# Patient Record
Sex: Female | Born: 1935
Health system: Southern US, Community
[De-identification: ages and names within clinical notes are randomized; demographics above are authoritative.]

## PROBLEM LIST (undated history)

## (undated) DIAGNOSIS — M542 Cervicalgia: Secondary | ICD-10-CM

## (undated) DIAGNOSIS — Z0000 Encounter for general adult medical examination without abnormal findings: Secondary | ICD-10-CM

## (undated) DIAGNOSIS — I10 Essential (primary) hypertension: Secondary | ICD-10-CM

## (undated) DIAGNOSIS — E785 Hyperlipidemia, unspecified: Secondary | ICD-10-CM

## (undated) DIAGNOSIS — R42 Dizziness and giddiness: Secondary | ICD-10-CM

## (undated) DIAGNOSIS — R059 Cough, unspecified: Secondary | ICD-10-CM

## (undated) DIAGNOSIS — K219 Gastro-esophageal reflux disease without esophagitis: Secondary | ICD-10-CM

## (undated) DIAGNOSIS — D649 Anemia, unspecified: Secondary | ICD-10-CM

## (undated) DIAGNOSIS — I2510 Atherosclerotic heart disease of native coronary artery without angina pectoris: Secondary | ICD-10-CM

## (undated) DIAGNOSIS — Z8489 Family history of other specified conditions: Secondary | ICD-10-CM

## (undated) DIAGNOSIS — A0472 Enterocolitis due to Clostridium difficile, not specified as recurrent: Secondary | ICD-10-CM

## (undated) DIAGNOSIS — M1990 Unspecified osteoarthritis, unspecified site: Secondary | ICD-10-CM

## (undated) DIAGNOSIS — E119 Type 2 diabetes mellitus without complications: Secondary | ICD-10-CM

## (undated) DIAGNOSIS — R221 Localized swelling, mass and lump, neck: Secondary | ICD-10-CM

## (undated) DIAGNOSIS — L301 Dyshidrosis [pompholyx]: Secondary | ICD-10-CM

## (undated) DIAGNOSIS — N289 Disorder of kidney and ureter, unspecified: Secondary | ICD-10-CM

## (undated) DIAGNOSIS — K5900 Constipation, unspecified: Secondary | ICD-10-CM

## (undated) DIAGNOSIS — F32A Depression, unspecified: Secondary | ICD-10-CM

## (undated) DIAGNOSIS — F329 Major depressive disorder, single episode, unspecified: Secondary | ICD-10-CM

## (undated) DIAGNOSIS — C801 Malignant (primary) neoplasm, unspecified: Secondary | ICD-10-CM

## (undated) DIAGNOSIS — K810 Acute cholecystitis: Secondary | ICD-10-CM

## (undated) DIAGNOSIS — E039 Hypothyroidism, unspecified: Secondary | ICD-10-CM

## (undated) DIAGNOSIS — N390 Urinary tract infection, site not specified: Secondary | ICD-10-CM

## (undated) DIAGNOSIS — R05 Cough: Secondary | ICD-10-CM

## (undated) DIAGNOSIS — N2581 Secondary hyperparathyroidism of renal origin: Secondary | ICD-10-CM

## (undated) DIAGNOSIS — N189 Chronic kidney disease, unspecified: Secondary | ICD-10-CM

## (undated) HISTORY — DX: Essential (primary) hypertension: I10

## (undated) HISTORY — DX: Cough: R05

## (undated) HISTORY — DX: Hyperlipidemia, unspecified: E78.5

## (undated) HISTORY — DX: Localized swelling, mass and lump, neck: R22.1

## (undated) HISTORY — DX: Acute cholecystitis: K81.0

## (undated) HISTORY — DX: Urinary tract infection, site not specified: N39.0

## (undated) HISTORY — DX: Type 2 diabetes mellitus without complications: E11.9

## (undated) HISTORY — PX: CHOLECYSTECTOMY: SHX55

## (undated) HISTORY — DX: Atherosclerotic heart disease of native coronary artery without angina pectoris: I25.10

## (undated) HISTORY — DX: Disorder of kidney and ureter, unspecified: N28.9

## (undated) HISTORY — DX: Anemia, unspecified: D64.9

## (undated) HISTORY — DX: Hypothyroidism, unspecified: E03.9

## (undated) HISTORY — DX: Cervicalgia: M54.2

## (undated) HISTORY — DX: Encounter for general adult medical examination without abnormal findings: Z00.00

## (undated) HISTORY — DX: Chronic kidney disease, unspecified: N18.9

## (undated) HISTORY — DX: Secondary hyperparathyroidism of renal origin: N25.81

## (undated) HISTORY — DX: Dyshidrosis (pompholyx): L30.1

## (undated) HISTORY — DX: Cough, unspecified: R05.9

## (undated) HISTORY — DX: Dizziness and giddiness: R42

## (undated) HISTORY — DX: Enterocolitis due to Clostridium difficile, not specified as recurrent: A04.72

## (undated) HISTORY — DX: Gastro-esophageal reflux disease without esophagitis: K21.9

---

## 1975-04-21 HISTORY — PX: OTHER SURGICAL HISTORY: SHX169

## 1986-04-20 HISTORY — PX: OTHER SURGICAL HISTORY: SHX169

## 2000-07-28 ENCOUNTER — Emergency Department (HOSPITAL_COMMUNITY): Admission: EM | Admit: 2000-07-28 | Discharge: 2000-07-28 | Payer: Self-pay | Admitting: Emergency Medicine

## 2001-03-21 ENCOUNTER — Other Ambulatory Visit: Admission: RE | Admit: 2001-03-21 | Discharge: 2001-03-21 | Payer: Self-pay | Admitting: Obstetrics and Gynecology

## 2001-04-06 ENCOUNTER — Encounter: Payer: Self-pay | Admitting: Obstetrics and Gynecology

## 2001-04-06 ENCOUNTER — Ambulatory Visit (HOSPITAL_COMMUNITY): Admission: RE | Admit: 2001-04-06 | Discharge: 2001-04-06 | Payer: Self-pay | Admitting: Obstetrics and Gynecology

## 2002-04-07 ENCOUNTER — Ambulatory Visit (HOSPITAL_COMMUNITY): Admission: RE | Admit: 2002-04-07 | Discharge: 2002-04-07 | Payer: Self-pay | Admitting: Obstetrics and Gynecology

## 2002-04-07 ENCOUNTER — Encounter: Payer: Self-pay | Admitting: Obstetrics and Gynecology

## 2003-04-19 ENCOUNTER — Ambulatory Visit (HOSPITAL_COMMUNITY): Admission: RE | Admit: 2003-04-19 | Discharge: 2003-04-19 | Payer: Self-pay | Admitting: Obstetrics & Gynecology

## 2004-02-20 ENCOUNTER — Ambulatory Visit: Payer: Self-pay | Admitting: Endocrinology

## 2004-03-21 ENCOUNTER — Ambulatory Visit: Payer: Self-pay | Admitting: Internal Medicine

## 2004-04-28 ENCOUNTER — Ambulatory Visit (HOSPITAL_COMMUNITY): Admission: RE | Admit: 2004-04-28 | Discharge: 2004-04-28 | Payer: Self-pay | Admitting: Endocrinology

## 2004-05-19 ENCOUNTER — Ambulatory Visit: Payer: Self-pay | Admitting: Endocrinology

## 2004-06-11 ENCOUNTER — Ambulatory Visit: Payer: Self-pay | Admitting: Endocrinology

## 2004-07-10 ENCOUNTER — Ambulatory Visit: Payer: Self-pay | Admitting: Endocrinology

## 2004-07-11 ENCOUNTER — Ambulatory Visit (HOSPITAL_COMMUNITY): Admission: RE | Admit: 2004-07-11 | Discharge: 2004-07-11 | Payer: Self-pay | Admitting: Endocrinology

## 2004-07-23 ENCOUNTER — Ambulatory Visit: Payer: Self-pay | Admitting: Endocrinology

## 2004-09-09 ENCOUNTER — Ambulatory Visit: Payer: Self-pay | Admitting: Endocrinology

## 2004-10-22 ENCOUNTER — Ambulatory Visit: Payer: Self-pay | Admitting: Endocrinology

## 2005-01-29 ENCOUNTER — Ambulatory Visit: Payer: Self-pay | Admitting: Endocrinology

## 2005-03-03 ENCOUNTER — Ambulatory Visit: Payer: Self-pay | Admitting: Endocrinology

## 2005-03-10 ENCOUNTER — Ambulatory Visit: Payer: Self-pay | Admitting: Endocrinology

## 2005-03-23 ENCOUNTER — Ambulatory Visit: Payer: Self-pay | Admitting: Endocrinology

## 2005-04-17 ENCOUNTER — Encounter (HOSPITAL_COMMUNITY): Admission: RE | Admit: 2005-04-17 | Discharge: 2005-07-16 | Payer: Self-pay | Admitting: Nephrology

## 2005-04-20 HISTORY — PX: OTHER SURGICAL HISTORY: SHX169

## 2005-05-04 ENCOUNTER — Ambulatory Visit: Payer: Self-pay | Admitting: Endocrinology

## 2005-05-20 ENCOUNTER — Ambulatory Visit (HOSPITAL_COMMUNITY): Admission: RE | Admit: 2005-05-20 | Discharge: 2005-05-20 | Payer: Self-pay | Admitting: Endocrinology

## 2005-05-25 ENCOUNTER — Ambulatory Visit: Payer: Self-pay | Admitting: Endocrinology

## 2005-06-24 ENCOUNTER — Ambulatory Visit: Payer: Self-pay | Admitting: Endocrinology

## 2005-07-02 ENCOUNTER — Ambulatory Visit: Payer: Self-pay | Admitting: Endocrinology

## 2005-07-09 ENCOUNTER — Ambulatory Visit: Payer: Self-pay | Admitting: Endocrinology

## 2005-07-12 ENCOUNTER — Emergency Department (HOSPITAL_COMMUNITY): Admission: EM | Admit: 2005-07-12 | Discharge: 2005-07-12 | Payer: Self-pay | Admitting: Emergency Medicine

## 2005-07-15 ENCOUNTER — Ambulatory Visit: Payer: Self-pay | Admitting: Endocrinology

## 2005-07-27 ENCOUNTER — Ambulatory Visit: Payer: Self-pay | Admitting: Internal Medicine

## 2005-07-30 ENCOUNTER — Encounter (HOSPITAL_COMMUNITY): Admission: RE | Admit: 2005-07-30 | Discharge: 2005-10-28 | Payer: Self-pay | Admitting: Nephrology

## 2005-08-21 HISTORY — PX: OTHER SURGICAL HISTORY: SHX169

## 2005-10-07 ENCOUNTER — Ambulatory Visit: Payer: Self-pay | Admitting: Endocrinology

## 2005-10-15 ENCOUNTER — Ambulatory Visit: Payer: Self-pay | Admitting: Endocrinology

## 2005-11-02 ENCOUNTER — Encounter (HOSPITAL_COMMUNITY): Admission: RE | Admit: 2005-11-02 | Discharge: 2005-12-30 | Payer: Self-pay | Admitting: Nephrology

## 2005-11-25 ENCOUNTER — Ambulatory Visit: Payer: Self-pay | Admitting: Endocrinology

## 2005-12-10 ENCOUNTER — Ambulatory Visit: Payer: Self-pay | Admitting: Endocrinology

## 2005-12-30 ENCOUNTER — Encounter (HOSPITAL_COMMUNITY): Admission: RE | Admit: 2005-12-30 | Discharge: 2006-03-30 | Payer: Self-pay | Admitting: Nephrology

## 2006-01-21 ENCOUNTER — Ambulatory Visit: Payer: Self-pay | Admitting: Endocrinology

## 2006-02-01 ENCOUNTER — Ambulatory Visit: Payer: Self-pay | Admitting: Endocrinology

## 2006-02-01 HISTORY — PX: ELECTROCARDIOGRAM: SHX264

## 2006-02-02 ENCOUNTER — Ambulatory Visit: Payer: Self-pay

## 2006-02-04 ENCOUNTER — Ambulatory Visit: Admission: RE | Admit: 2006-02-04 | Discharge: 2006-02-04 | Payer: Self-pay | Admitting: Orthopedic Surgery

## 2006-02-10 ENCOUNTER — Ambulatory Visit (HOSPITAL_COMMUNITY): Admission: RE | Admit: 2006-02-10 | Discharge: 2006-02-10 | Payer: Self-pay | Admitting: Endocrinology

## 2006-02-23 ENCOUNTER — Ambulatory Visit: Payer: Self-pay | Admitting: Endocrinology

## 2006-02-24 ENCOUNTER — Ambulatory Visit: Payer: Self-pay | Admitting: Cardiology

## 2006-03-02 ENCOUNTER — Inpatient Hospital Stay (HOSPITAL_COMMUNITY): Admission: RE | Admit: 2006-03-02 | Discharge: 2006-03-05 | Payer: Self-pay | Admitting: Orthopedic Surgery

## 2006-03-03 ENCOUNTER — Ambulatory Visit: Payer: Self-pay | Admitting: Physical Medicine & Rehabilitation

## 2006-04-21 LAB — CONVERTED CEMR LAB: Pap Smear: NORMAL

## 2006-05-05 ENCOUNTER — Ambulatory Visit: Payer: Self-pay | Admitting: Endocrinology

## 2006-05-12 ENCOUNTER — Encounter (HOSPITAL_COMMUNITY): Admission: RE | Admit: 2006-05-12 | Discharge: 2006-08-10 | Payer: Self-pay | Admitting: Nephrology

## 2006-08-06 ENCOUNTER — Ambulatory Visit: Payer: Self-pay | Admitting: Endocrinology

## 2006-08-19 ENCOUNTER — Encounter (HOSPITAL_COMMUNITY): Admission: RE | Admit: 2006-08-19 | Discharge: 2006-11-17 | Payer: Self-pay | Admitting: Nephrology

## 2006-09-20 ENCOUNTER — Encounter: Payer: Self-pay | Admitting: Endocrinology

## 2006-09-20 DIAGNOSIS — M109 Gout, unspecified: Secondary | ICD-10-CM

## 2006-09-20 DIAGNOSIS — I10 Essential (primary) hypertension: Secondary | ICD-10-CM | POA: Insufficient documentation

## 2006-09-20 DIAGNOSIS — M810 Age-related osteoporosis without current pathological fracture: Secondary | ICD-10-CM

## 2006-09-20 DIAGNOSIS — R32 Unspecified urinary incontinence: Secondary | ICD-10-CM

## 2006-09-20 DIAGNOSIS — E785 Hyperlipidemia, unspecified: Secondary | ICD-10-CM | POA: Insufficient documentation

## 2006-09-20 DIAGNOSIS — J309 Allergic rhinitis, unspecified: Secondary | ICD-10-CM | POA: Insufficient documentation

## 2006-11-26 ENCOUNTER — Encounter (HOSPITAL_COMMUNITY): Admission: RE | Admit: 2006-11-26 | Discharge: 2007-02-24 | Payer: Self-pay | Admitting: Nephrology

## 2007-02-07 ENCOUNTER — Ambulatory Visit: Payer: Self-pay | Admitting: Endocrinology

## 2007-02-07 LAB — CONVERTED CEMR LAB
AST: 21 units/L (ref 0–37)
Albumin: 3.6 g/dL (ref 3.5–5.2)
BUN: 42 mg/dL — ABNORMAL HIGH (ref 6–23)
CO2: 32 meq/L (ref 19–32)
Cholesterol: 145 mg/dL (ref 0–200)
GFR calc Af Amer: 28 mL/min
GFR calc non Af Amer: 23 mL/min
LDL Cholesterol: 85 mg/dL (ref 0–99)
Potassium: 4.7 meq/L (ref 3.5–5.1)
Sodium: 143 meq/L (ref 135–145)
Total Protein: 6.8 g/dL (ref 6.0–8.3)
Triglycerides: 115 mg/dL (ref 0–149)
Uric Acid, Serum: 4.4 mg/dL (ref 2.4–7.0)
VLDL: 23 mg/dL (ref 0–40)

## 2007-03-09 ENCOUNTER — Ambulatory Visit: Payer: Self-pay | Admitting: Endocrinology

## 2007-03-09 DIAGNOSIS — K219 Gastro-esophageal reflux disease without esophagitis: Secondary | ICD-10-CM

## 2007-03-10 ENCOUNTER — Encounter (HOSPITAL_COMMUNITY): Admission: RE | Admit: 2007-03-10 | Discharge: 2007-06-08 | Payer: Self-pay | Admitting: Nephrology

## 2007-03-15 ENCOUNTER — Ambulatory Visit: Payer: Self-pay | Admitting: Cardiology

## 2007-04-05 ENCOUNTER — Ambulatory Visit: Payer: Self-pay | Admitting: Internal Medicine

## 2007-04-05 DIAGNOSIS — R220 Localized swelling, mass and lump, head: Secondary | ICD-10-CM

## 2007-04-05 DIAGNOSIS — R221 Localized swelling, mass and lump, neck: Secondary | ICD-10-CM

## 2007-04-20 ENCOUNTER — Ambulatory Visit: Payer: Self-pay | Admitting: Endocrinology

## 2007-04-20 DIAGNOSIS — M542 Cervicalgia: Secondary | ICD-10-CM | POA: Insufficient documentation

## 2007-04-20 DIAGNOSIS — E039 Hypothyroidism, unspecified: Secondary | ICD-10-CM

## 2007-04-20 LAB — CONVERTED CEMR LAB: TSH: 5.12 microintl units/mL (ref 0.35–5.50)

## 2007-04-28 ENCOUNTER — Encounter: Payer: Self-pay | Admitting: Endocrinology

## 2007-05-04 ENCOUNTER — Encounter: Payer: Self-pay | Admitting: Endocrinology

## 2007-05-19 ENCOUNTER — Ambulatory Visit: Payer: Self-pay | Admitting: Internal Medicine

## 2007-05-23 ENCOUNTER — Encounter: Payer: Self-pay | Admitting: Endocrinology

## 2007-05-27 ENCOUNTER — Encounter: Payer: Self-pay | Admitting: Internal Medicine

## 2007-06-03 ENCOUNTER — Encounter: Payer: Self-pay | Admitting: Internal Medicine

## 2007-06-13 ENCOUNTER — Encounter: Payer: Self-pay | Admitting: Endocrinology

## 2007-06-15 ENCOUNTER — Ambulatory Visit: Payer: Self-pay | Admitting: Endocrinology

## 2007-06-15 DIAGNOSIS — N2581 Secondary hyperparathyroidism of renal origin: Secondary | ICD-10-CM

## 2007-06-16 LAB — CONVERTED CEMR LAB: Hgb A1c MFr Bld: 5.4 % (ref 4.6–6.0)

## 2007-06-23 ENCOUNTER — Encounter (HOSPITAL_COMMUNITY): Admission: RE | Admit: 2007-06-23 | Discharge: 2007-09-21 | Payer: Self-pay | Admitting: Nephrology

## 2007-08-17 ENCOUNTER — Encounter: Payer: Self-pay | Admitting: Endocrinology

## 2007-08-17 ENCOUNTER — Ambulatory Visit: Payer: Self-pay | Admitting: Internal Medicine

## 2007-09-29 ENCOUNTER — Encounter (HOSPITAL_COMMUNITY): Admission: RE | Admit: 2007-09-29 | Discharge: 2007-12-28 | Payer: Self-pay | Admitting: Nephrology

## 2007-11-23 ENCOUNTER — Telehealth: Payer: Self-pay | Admitting: Endocrinology

## 2008-01-03 ENCOUNTER — Telehealth: Payer: Self-pay | Admitting: Endocrinology

## 2008-01-12 ENCOUNTER — Encounter (HOSPITAL_COMMUNITY): Admission: RE | Admit: 2008-01-12 | Discharge: 2008-04-11 | Payer: Self-pay | Admitting: Nephrology

## 2008-01-17 ENCOUNTER — Telehealth: Payer: Self-pay | Admitting: Internal Medicine

## 2008-02-01 ENCOUNTER — Ambulatory Visit: Payer: Self-pay | Admitting: Endocrinology

## 2008-02-01 LAB — CONVERTED CEMR LAB
ALT: 15 units/L (ref 0–35)
Albumin: 3.5 g/dL (ref 3.5–5.2)
Bilirubin, Direct: 0.2 mg/dL (ref 0.0–0.3)
HDL: 44.7 mg/dL (ref >39.0)
Hgb A1c MFr Bld: 5.2 % (ref 4.6–6.0)
Total Bilirubin: 0.9 mg/dL (ref 0.3–1.2)
Total Protein: 6.9 g/dL (ref 6.0–8.3)
Triglycerides: 125 mg/dL (ref 0–149)

## 2008-02-08 ENCOUNTER — Telehealth: Payer: Self-pay | Admitting: Endocrinology

## 2008-02-13 ENCOUNTER — Telehealth (INDEPENDENT_AMBULATORY_CARE_PROVIDER_SITE_OTHER): Payer: Self-pay | Admitting: *Deleted

## 2008-02-13 ENCOUNTER — Telehealth: Payer: Self-pay | Admitting: Endocrinology

## 2008-03-06 ENCOUNTER — Encounter: Payer: Self-pay | Admitting: Endocrinology

## 2008-04-26 ENCOUNTER — Encounter (HOSPITAL_COMMUNITY): Admission: RE | Admit: 2008-04-26 | Discharge: 2008-07-25 | Payer: Self-pay | Admitting: Nephrology

## 2008-05-23 ENCOUNTER — Encounter: Payer: Self-pay | Admitting: Endocrinology

## 2008-06-19 ENCOUNTER — Telehealth: Payer: Self-pay | Admitting: Endocrinology

## 2008-07-03 ENCOUNTER — Ambulatory Visit: Payer: Self-pay | Admitting: Endocrinology

## 2008-07-03 DIAGNOSIS — R42 Dizziness and giddiness: Secondary | ICD-10-CM

## 2008-07-26 ENCOUNTER — Encounter (HOSPITAL_COMMUNITY): Admission: RE | Admit: 2008-07-26 | Discharge: 2008-10-24 | Payer: Self-pay | Admitting: Nephrology

## 2008-08-14 ENCOUNTER — Telehealth: Payer: Self-pay | Admitting: Endocrinology

## 2008-08-23 ENCOUNTER — Ambulatory Visit: Payer: Self-pay | Admitting: Endocrinology

## 2008-08-23 LAB — CONVERTED CEMR LAB
Hgb A1c MFr Bld: 5.5 % (ref 4.6–6.5)
PTH: 46.3 pg/mL (ref 14.0–72.0)

## 2008-09-10 ENCOUNTER — Ambulatory Visit: Payer: Self-pay | Admitting: Endocrinology

## 2008-09-11 ENCOUNTER — Telehealth (INDEPENDENT_AMBULATORY_CARE_PROVIDER_SITE_OTHER): Payer: Self-pay | Admitting: *Deleted

## 2008-10-11 ENCOUNTER — Ambulatory Visit: Payer: Self-pay | Admitting: Endocrinology

## 2008-10-16 ENCOUNTER — Telehealth (INDEPENDENT_AMBULATORY_CARE_PROVIDER_SITE_OTHER): Payer: Self-pay | Admitting: *Deleted

## 2008-10-25 ENCOUNTER — Encounter (HOSPITAL_COMMUNITY): Admission: RE | Admit: 2008-10-25 | Discharge: 2009-01-23 | Payer: Self-pay | Admitting: Nephrology

## 2008-11-22 ENCOUNTER — Encounter: Payer: Self-pay | Admitting: Endocrinology

## 2008-12-26 ENCOUNTER — Ambulatory Visit: Payer: Self-pay | Admitting: Endocrinology

## 2008-12-26 LAB — CONVERTED CEMR LAB
Basophils Absolute: 0 10*3/uL (ref 0.0–0.1)
Bilirubin Urine: NEGATIVE
Calcium: 10.2 mg/dL (ref 8.4–10.5)
Cholesterol: 160 mg/dL (ref 0–200)
Creatinine, Ser: 2.2 mg/dL — ABNORMAL HIGH (ref 0.4–1.2)
Creatinine,U: 83.1 mg/dL
Eosinophils Relative: 3.3 % (ref 0.0–5.0)
GFR calc non Af Amer: 28.08 mL/min (ref >60)
HCT: 35.6 % — ABNORMAL LOW (ref 36.0–46.0)
HDL: 47.3 mg/dL (ref >39.00)
Hemoglobin: 11.8 g/dL — ABNORMAL LOW (ref 12.0–15.0)
Lymphocytes Relative: 29.4 % (ref 12.0–46.0)
Lymphs Abs: 1.3 10*3/uL (ref 0.7–4.0)
MCV: 97 fL (ref 78.0–100.0)
Microalb Creat Ratio: 8.4 mg/g (ref 0.0–30.0)
Microalb, Ur: 0.7 mg/dL (ref 0.0–1.9)
Monocytes Absolute: 0.5 10*3/uL (ref 0.1–1.0)
Monocytes Relative: 10.2 % (ref 3.0–12.0)
Neutro Abs: 2.6 10*3/uL (ref 1.4–7.7)
Neutrophils Relative %: 56.5 % (ref 43.0–77.0)
Nitrite: NEGATIVE
RBC: 3.67 M/uL — ABNORMAL LOW (ref 3.87–5.11)
Saturation Ratios: 22.1 % (ref 20.0–50.0)
Sodium: 138 meq/L (ref 135–145)
Specific Gravity, Urine: <=1.005 (ref 1.000–1.030)
TSH: 2.81 microintl units/mL (ref 0.35–5.50)
Total Bilirubin: 1.2 mg/dL (ref 0.3–1.2)
Total CHOL/HDL Ratio: 3
Total Protein: 7.2 g/dL (ref 6.0–8.3)
Urobilinogen, UA: 0.2 (ref 0.0–1.0)
VLDL: 30.4 mg/dL (ref 0.0–40.0)
WBC: 4.5 10*3/uL (ref 4.5–10.5)
pH: 7 (ref 5.0–8.0)

## 2009-01-01 ENCOUNTER — Telehealth: Payer: Self-pay | Admitting: Endocrinology

## 2009-01-16 ENCOUNTER — Encounter (INDEPENDENT_AMBULATORY_CARE_PROVIDER_SITE_OTHER): Payer: Self-pay | Admitting: *Deleted

## 2009-01-31 ENCOUNTER — Encounter (HOSPITAL_COMMUNITY): Admission: RE | Admit: 2009-01-31 | Discharge: 2009-05-01 | Payer: Self-pay | Admitting: Nephrology

## 2009-02-11 ENCOUNTER — Encounter: Admission: RE | Admit: 2009-02-11 | Discharge: 2009-02-11 | Payer: Self-pay | Admitting: Nephrology

## 2009-04-08 ENCOUNTER — Ambulatory Visit: Payer: Self-pay | Admitting: Endocrinology

## 2009-04-08 DIAGNOSIS — L301 Dyshidrosis [pompholyx]: Secondary | ICD-10-CM

## 2009-04-08 LAB — CONVERTED CEMR LAB: Hgb A1c MFr Bld: 6.2 % (ref 4.6–6.5)

## 2009-05-09 ENCOUNTER — Encounter (HOSPITAL_COMMUNITY): Admission: RE | Admit: 2009-05-09 | Discharge: 2009-08-07 | Payer: Self-pay | Admitting: Nephrology

## 2009-05-31 ENCOUNTER — Ambulatory Visit: Payer: Self-pay | Admitting: Endocrinology

## 2009-07-02 ENCOUNTER — Encounter: Payer: Self-pay | Admitting: Endocrinology

## 2009-07-10 ENCOUNTER — Telehealth (INDEPENDENT_AMBULATORY_CARE_PROVIDER_SITE_OTHER): Payer: Self-pay | Admitting: *Deleted

## 2009-07-26 ENCOUNTER — Telehealth: Payer: Self-pay | Admitting: Endocrinology

## 2009-07-31 ENCOUNTER — Ambulatory Visit: Payer: Self-pay | Admitting: Cardiology

## 2009-08-02 ENCOUNTER — Inpatient Hospital Stay (HOSPITAL_COMMUNITY): Admission: EM | Admit: 2009-08-02 | Discharge: 2009-08-11 | Payer: Self-pay | Admitting: Emergency Medicine

## 2009-08-08 ENCOUNTER — Encounter (HOSPITAL_COMMUNITY): Admission: RE | Admit: 2009-08-08 | Discharge: 2009-11-06 | Payer: Self-pay | Admitting: Nephrology

## 2009-08-15 ENCOUNTER — Telehealth: Payer: Self-pay | Admitting: Endocrinology

## 2009-08-15 ENCOUNTER — Ambulatory Visit: Payer: Self-pay | Admitting: Endocrinology

## 2009-08-15 DIAGNOSIS — K810 Acute cholecystitis: Secondary | ICD-10-CM

## 2009-08-16 ENCOUNTER — Telehealth: Payer: Self-pay | Admitting: Endocrinology

## 2009-08-16 ENCOUNTER — Encounter: Payer: Self-pay | Admitting: Endocrinology

## 2009-08-21 ENCOUNTER — Encounter: Payer: Self-pay | Admitting: Endocrinology

## 2009-08-21 ENCOUNTER — Encounter (INDEPENDENT_AMBULATORY_CARE_PROVIDER_SITE_OTHER): Payer: Self-pay | Admitting: *Deleted

## 2009-08-21 ENCOUNTER — Telehealth: Payer: Self-pay | Admitting: Endocrinology

## 2009-08-21 LAB — CONVERTED CEMR LAB
AST: 10 units/L
BUN: 9 mg/dL
CO2: 30 meq/L
Eosinophils Relative: 4 %
GFR calc Af Amer: 30 mL/min
GFR calc non Af Amer: 26 mL/min
Hemoglobin: 8.9 g/dL
Potassium: 5.5 meq/L
WBC: 3.9 10*3/uL

## 2009-08-23 ENCOUNTER — Encounter: Payer: Self-pay | Admitting: Endocrinology

## 2009-08-23 DIAGNOSIS — N183 Chronic kidney disease, stage 3 unspecified: Secondary | ICD-10-CM | POA: Insufficient documentation

## 2009-08-23 DIAGNOSIS — I2510 Atherosclerotic heart disease of native coronary artery without angina pectoris: Secondary | ICD-10-CM | POA: Insufficient documentation

## 2009-08-23 DIAGNOSIS — R079 Chest pain, unspecified: Secondary | ICD-10-CM | POA: Insufficient documentation

## 2009-08-23 DIAGNOSIS — A0472 Enterocolitis due to Clostridium difficile, not specified as recurrent: Secondary | ICD-10-CM | POA: Insufficient documentation

## 2009-08-26 ENCOUNTER — Telehealth (INDEPENDENT_AMBULATORY_CARE_PROVIDER_SITE_OTHER): Payer: Self-pay | Admitting: *Deleted

## 2009-08-27 ENCOUNTER — Encounter: Payer: Self-pay | Admitting: Endocrinology

## 2009-08-28 ENCOUNTER — Encounter: Payer: Self-pay | Admitting: Endocrinology

## 2009-08-29 ENCOUNTER — Encounter (INDEPENDENT_AMBULATORY_CARE_PROVIDER_SITE_OTHER): Payer: Self-pay | Admitting: *Deleted

## 2009-08-29 ENCOUNTER — Telehealth (INDEPENDENT_AMBULATORY_CARE_PROVIDER_SITE_OTHER): Payer: Self-pay | Admitting: *Deleted

## 2009-09-02 ENCOUNTER — Ambulatory Visit: Payer: Self-pay | Admitting: Endocrinology

## 2009-09-02 ENCOUNTER — Ambulatory Visit: Payer: Self-pay | Admitting: Cardiology

## 2009-09-04 LAB — CONVERTED CEMR LAB
Albumin: 3.3 g/dL — ABNORMAL LOW (ref 3.5–5.2)
Basophils Relative: 0.8 % (ref 0.0–3.0)
Bilirubin, Direct: 0.2 mg/dL (ref 0.0–0.3)
CO2: 30 meq/L (ref 19–32)
Chloride: 96 meq/L (ref 96–112)
Eosinophils Absolute: 0.2 10*3/uL (ref 0.0–0.7)
Folate: 4.3 ng/mL
GFR calc non Af Amer: 42.3 mL/min (ref >60)
Glucose, Bld: 123 mg/dL — ABNORMAL HIGH (ref 70–99)
HCT: 32.3 % — ABNORMAL LOW (ref 36.0–46.0)
Hemoglobin: 10.8 g/dL — ABNORMAL LOW (ref 12.0–15.0)
MCHC: 33.4 g/dL (ref 30.0–36.0)
Monocytes Relative: 14 % — ABNORMAL HIGH (ref 3.0–12.0)
Neutro Abs: 1.4 10*3/uL (ref 1.4–7.7)
Neutrophils Relative %: 35.5 % — ABNORMAL LOW (ref 43.0–77.0)
Platelets: 120 10*3/uL — ABNORMAL LOW (ref 150.0–400.0)
Potassium: 4.2 meq/L (ref 3.5–5.1)
RBC: 3.29 M/uL — ABNORMAL LOW (ref 3.87–5.11)
Saturation Ratios: 16 % — ABNORMAL LOW (ref 20.0–50.0)
Total Bilirubin: 0.6 mg/dL (ref 0.3–1.2)
Transferrin: 165.5 mg/dL — ABNORMAL LOW (ref 212.0–360.0)
Vitamin B-12: 829 pg/mL (ref 211–911)

## 2009-09-06 ENCOUNTER — Ambulatory Visit: Payer: Self-pay | Admitting: Endocrinology

## 2009-10-05 ENCOUNTER — Encounter: Payer: Self-pay | Admitting: Endocrinology

## 2009-10-09 ENCOUNTER — Ambulatory Visit: Payer: Self-pay | Admitting: Endocrinology

## 2009-10-09 DIAGNOSIS — D61818 Other pancytopenia: Secondary | ICD-10-CM

## 2009-10-14 ENCOUNTER — Telehealth: Payer: Self-pay | Admitting: Endocrinology

## 2009-10-14 ENCOUNTER — Encounter: Payer: Self-pay | Admitting: Internal Medicine

## 2009-10-15 ENCOUNTER — Encounter: Payer: Self-pay | Admitting: Endocrinology

## 2009-11-04 ENCOUNTER — Telehealth: Payer: Self-pay | Admitting: Endocrinology

## 2009-11-17 ENCOUNTER — Encounter: Payer: Self-pay | Admitting: Endocrinology

## 2009-11-21 ENCOUNTER — Encounter (HOSPITAL_COMMUNITY): Admission: RE | Admit: 2009-11-21 | Discharge: 2010-02-19 | Payer: Self-pay | Admitting: Nephrology

## 2009-11-25 ENCOUNTER — Emergency Department (HOSPITAL_COMMUNITY): Admission: EM | Admit: 2009-11-25 | Discharge: 2009-11-25 | Payer: Self-pay | Admitting: Emergency Medicine

## 2009-11-27 ENCOUNTER — Encounter: Payer: Self-pay | Admitting: Endocrinology

## 2009-11-28 ENCOUNTER — Ambulatory Visit: Payer: Self-pay | Admitting: Endocrinology

## 2009-11-28 ENCOUNTER — Encounter: Payer: Self-pay | Admitting: Endocrinology

## 2009-12-02 ENCOUNTER — Ambulatory Visit: Payer: Self-pay | Admitting: Internal Medicine

## 2009-12-02 DIAGNOSIS — M25512 Pain in left shoulder: Secondary | ICD-10-CM | POA: Insufficient documentation

## 2009-12-03 ENCOUNTER — Telehealth: Payer: Self-pay | Admitting: Internal Medicine

## 2009-12-17 ENCOUNTER — Ambulatory Visit: Payer: Self-pay | Admitting: Endocrinology

## 2009-12-18 ENCOUNTER — Encounter: Payer: Self-pay | Admitting: Endocrinology

## 2009-12-19 ENCOUNTER — Ambulatory Visit: Payer: Self-pay | Admitting: Internal Medicine

## 2010-01-01 ENCOUNTER — Telehealth (INDEPENDENT_AMBULATORY_CARE_PROVIDER_SITE_OTHER): Payer: Self-pay | Admitting: *Deleted

## 2010-01-06 ENCOUNTER — Encounter: Payer: Self-pay | Admitting: Endocrinology

## 2010-01-08 ENCOUNTER — Encounter: Payer: Self-pay | Admitting: Endocrinology

## 2010-01-13 ENCOUNTER — Ambulatory Visit: Payer: Self-pay | Admitting: Endocrinology

## 2010-02-24 ENCOUNTER — Encounter (HOSPITAL_COMMUNITY)
Admission: RE | Admit: 2010-02-24 | Discharge: 2010-05-20 | Payer: Self-pay | Source: Home / Self Care | Attending: Nephrology | Admitting: Nephrology

## 2010-03-04 ENCOUNTER — Ambulatory Visit: Payer: Self-pay | Admitting: Endocrinology

## 2010-03-04 DIAGNOSIS — H60399 Other infective otitis externa, unspecified ear: Secondary | ICD-10-CM | POA: Insufficient documentation

## 2010-04-08 ENCOUNTER — Telehealth (INDEPENDENT_AMBULATORY_CARE_PROVIDER_SITE_OTHER): Payer: Self-pay | Admitting: *Deleted

## 2010-04-15 ENCOUNTER — Ambulatory Visit
Admission: RE | Admit: 2010-04-15 | Discharge: 2010-04-15 | Payer: Self-pay | Source: Home / Self Care | Attending: Internal Medicine | Admitting: Internal Medicine

## 2010-04-15 DIAGNOSIS — J019 Acute sinusitis, unspecified: Secondary | ICD-10-CM

## 2010-05-05 LAB — POCT HEMOGLOBIN-HEMACUE: Hemoglobin: 10.4 g/dL — ABNORMAL LOW (ref 12.0–15.0)

## 2010-05-05 LAB — IRON AND TIBC
Iron: 71 ug/dL (ref 42–135)
Saturation Ratios: 32 % (ref 20–55)
TIBC: 224 ug/dL — ABNORMAL LOW (ref 250–470)
UIBC: 153 ug/dL

## 2010-05-05 LAB — FERRITIN: Ferritin: 388 ng/mL — ABNORMAL HIGH (ref 10–291)

## 2010-05-20 NOTE — Progress Notes (Signed)
Summary: Omeprazole PA  Phone Note From Other Clinic   Caller: [facility] Summary of Call: PA request--Omeprazole. Form completed and faxed, will await insurance company reply. Initial call taken by: Ernestene Mention,  October 14, 2009 3:56 PM     Appended Document: Omeprazole PA Approved until 09/2010.

## 2010-05-20 NOTE — Progress Notes (Signed)
Summary: Ondansetron pa  Phone Note From Pharmacy   Details of Request: [facility] ID: [id number] Summary of Call: PA request--Ondansetron. Printed request form and faxed, will await prior authorization forms. Initial call taken by: Ernestene Mention,  August 15, 2009 2:59 PM  Follow-up for Phone Call        Patient chose to get med from cheaper pharmacy. Follow-up by: Ernestene Mention,  Aug 19, 2009 4:39 PM

## 2010-05-20 NOTE — Letter (Signed)
Summary: Disability Benefit Request form/[employer]  Disability Benefit Request form/[employer]   Imported By: Phillis Knack 12/03/2009 12:05:05  _____________________________________________________________________  External Attachment:    Type:   Image     Comment:   External Document

## 2010-05-20 NOTE — Assessment & Plan Note (Signed)
Summary: PER PT 1 MTH FU--STC   Vital Signs:  Patient profile:   77 year old female Height:      62 inches (157.48 cm) Weight:      286 pounds (130 kg) BMI:     52.50 O2 Sat:      91 % on Room air Temp:     97.0 degrees F (36.11 degrees C) oral Pulse rate:   94 / minute BP sitting:   152 / 88  (left arm) Cuff size:   large  Vitals Entered By: Rebeca Alert MA (October 09, 2009 1:58 PM)  O2 Flow:  Room air CC: 1 mo f/u/pt states she is not taking prilosec currently (is out of medication)/aj   Primary Provider:  Donavan Foil MD  CC:  1 mo f/u/pt states she is not taking prilosec currently (is out of medication)/aj.  History of Present Illness: the status of at least 3 ongoing medical problems is addressed today: htn:  pt states slight doe.   anemia:  she takes fe 1/day.  no brbpr. constipation:  miralax helps constipation well.     Current Medications (verified): 1)  Oxybutynin Chloride 5 Mg Tb24 (Oxybutynin Chloride) .... Take 1 Tablet By Mouth Once A Day 2)  Allopurinol 300 Mg  Tabs (Allopurinol) .... Take 1 By Mouth Qd 3)  Procrit 10000 Unit/ml  Soln (Epoetin Alfa) .... Dr Justin Mend 4)  Furosemide 20 Mg  Tabs (Furosemide) .... Take 1 By Mouth Qd 5)  Rocaltrol 0.5 Mcg  Caps (Calcitriol) .... Take 1 By Mouth Qd 6)  Zocor 80 Mg  Tabs (Simvastatin) .... Nightly 7)  Prilosec 20 Mg  Cpdr (Omeprazole) .... Take 2 Tablet By Mouth Once A Day 8)  Ecotrin Low Strength 81 Mg  Tbec (Aspirin) .Marland Kitchen.. 1 By Mouth Qd 9)  Tylenol Arthritis Pain 650 Mg  Tbcr (Acetaminophen) .... As Needed 10)  Triamcinolone Acetonide 0.1 %  Oint (Triamcinolone Acetonide) .... Apply Twice A Day As Needed Itching 11)  Metoprolol Succinate 25 Mg Xr24h-Tab (Metoprolol Succinate) .Marland Kitchen.. 1 Tab By Mouth 12)  Losartan Potassium-Hctz 100-25 Mg Tabs (Losartan Potassium-Hctz) .... 1/2 Tab Once Daily 13)  Iron 325 (65 Fe) Mg Tabs (Ferrous Sulfate) .Marland Kitchen.. 1 Once Daily 14)  Miralax  Powd (Polyethylene Glycol 3350) .Marland KitchenMarland KitchenMarland Kitchen 17 Grams  Once Daily 15)  Zofran 4 Mg Tabs (Ondansetron Hcl) .... Take 1 Tablet By Mouth Four Times A Day As Needed  Allergies (verified): 1)  ! Sulfa 2)  ! * Azithromycin 3)  ! * Tramadol 4)  ! Hydrocodone 5)  ! Actos (Pioglitazone Hcl)  Past History:  Past Medical History: Last updated: 08/23/2009 CHEST PAIN (ICD-786.50) CLOSTRIDIUM DIFFICILE COLITIS (ICD-008.45) CAD, NATIVE VESSEL (ICD-414.01) RENAL INSUFFICIENCY, CHRONIC (ICD-585.9) HYPERTENSION (ICD-401.9) HYPERLIPIDEMIA (ICD-272.4) DIABETES MELLITUS, TYPE II (ICD-250.00) ACUTE CHOLECYSTITIS (ICD-575.0) DYSHIDROSIS (ICD-705.81) ROUTINE GENERAL MEDICAL EXAM@HEALTH  CARE FACL (ICD-V70.0) DIZZINESS (ICD-780.4) COUGH DUE TO ACE INHIBITORS (ICD-786.2) ENCOUNTER FOR LONG-TERM USE OF OTHER MEDICATIONS (ICD-V58.69) SECONDARY HYPERPARATHYROIDISM (ICD-588.81) HYPOTHYROIDISM (ICD-244.9) NECK PAIN (ICD-723.1) NECK MASS (ICD-784.2) GERD (ICD-530.81) URINARY INCONTINENCE (ICD-788.30) OSTEOPOROSIS (ICD-733.00) GOUT (ICD-274.9) ANEMIA-NOS (ICD-285.9) ALLERGIC RHINITIS (ICD-477.9)          Review of Systems  The patient denies hematuria.    Physical Exam  General:  morbidly obese.  no distress  Lungs:  Clear to auscultation bilaterally. Normal respiratory effort.  Heart:  Regular rate and rhythm without murmurs or gallops noted. Normal S1,S2.   Extremities:  trace right pedal edema and trace left pedal edema.  Impression & Recommendations:  Problem # 1:  HYPERTENSION (ICD-401.9) since hosp d/c, his medication requirement is returning to baseline  Problem # 2:  PANCYTOPENIA (ICD-284.1) she needs fe to help the anemia component of this  Problem # 3:  constipation well-controlled  Medications Added to Medication List This Visit: 1)  Losartan Potassium-hctz 100-25 Mg Tabs (Losartan potassium-hctz) .Marland Kitchen.. 1 tab once daily 2)  Zofran 4 Mg Tabs (Ondansetron hcl) .... Take 1 tablet by mouth four times a day as needed  Other  Orders: Est. Patient Level III [id number]  Patient Instructions: 1)  increase losartan-hctz, 100/25, to 1 pill once daily. 2)  Please schedule a follow-up appointment in 2 months. 3)  same miralax. 4)  i have resent prilosec to [facility].   Prescriptions: PRILOSEC 20 MG  CPDR (OMEPRAZOLE) Take 2 tablet by mouth once a day  #180 x 3   Entered and Authorized by:   Donavan Foil MD   Signed by:   Donavan Foil MD on 10/09/2009   Method used:   Electronically to        [facility] (mail-[place]  [address]       Ph: [phone]       Fax: [phone]   RxID:   [id number] LOSARTAN POTASSIUM-HCTZ 100-25 MG TABS (LOSARTAN POTASSIUM-HCTZ) 1 tab once daily  #30 x 11   Entered and Authorized by:   Donavan Foil MD   Signed by:   Donavan Foil MD on 10/09/2009   Method used:   Electronically to        [facility]. [address] [phone]* (retail)       [address]       [place], [place]  [address]       Ph: [phone] or [phone]       Fax: [phone]   RxID:   [id number]

## 2010-05-20 NOTE — Letter (Signed)
Summary: [facility]  [id number]   Imported By: Rise Patience 01/23/2010 14:48:43  _____________________________________________________________________  External Attachment:    Type:   Image     Comment:   External Document

## 2010-05-20 NOTE — Assessment & Plan Note (Signed)
Summary: EAR ACHE/ RE-CK BROKEN RIBS/NWS #   Vital Signs:  Patient profile:   77 year old female Height:      62 inches (157.48 cm) Weight:      267.38 pounds (121.54 kg) BMI:     49.08 O2 Sat:      96 % on Room air Temp:     98.5 degrees F (36.94 degrees C) oral Pulse rate:   65 / minute BP sitting:   126 / 74  (left arm) Cuff size:   large  Vitals Entered By: Rebeca Alert CMA Deborra Medina) (March 04, 2010 11:15 AM)  O2 Flow:  Room air CC: Ear Pain/Re-check ribs/aj Is Patient Diabetic? Yes   Primary Provider:  Donavan Foil MD  CC:  Ear Pain/Re-check ribs/aj.  History of Present Illness: pt states 3 days of slight pain at the left external ear, but no assoc fever.   pt says her rib pain is 80-90% improved from the initial injury.    Current Medications (verified): 1)  Oxybutynin Chloride 5 Mg Tb24 (Oxybutynin Chloride) .... Take 1 Tablet By Mouth Once A Day 2)  Allopurinol 300 Mg  Tabs (Allopurinol) .... Take 1 By Mouth Qd 3)  Procrit 10000 Unit/ml  Soln (Epoetin Alfa) .... Dr Justin Mend 4)  Furosemide 20 Mg  Tabs (Furosemide) .... Take 1 By Mouth Qd 5)  Rocaltrol 0.5 Mcg  Caps (Calcitriol) .... Take 1 By Mouth Qd 6)  Zocor 80 Mg  Tabs (Simvastatin) .... Nightly 7)  Prilosec 20 Mg  Cpdr (Omeprazole) .... Take 2 Tablet By Mouth Once A Day 8)  Ecotrin Low Strength 81 Mg  Tbec (Aspirin) .Marland Kitchen.. 1 By Mouth Qd 9)  Triamcinolone Acetonide 0.1 %  Oint (Triamcinolone Acetonide) .... Apply Twice A Day As Needed Itching 10)  Metoprolol Succinate 25 Mg Xr24h-Tab (Metoprolol Succinate) .Marland Kitchen.. 1 Tab By Mouth 11)  Losartan Potassium-Hctz 100-25 Mg Tabs (Losartan Potassium-Hctz) .Marland Kitchen.. 1 Tab Once Daily 12)  Iron 325 (65 Fe) Mg Tabs (Ferrous Sulfate) .Marland Kitchen.. 1 Once Daily 13)  Miralax  Powd (Polyethylene Glycol 3350) .Marland KitchenMarland KitchenMarland Kitchen 17 Grams Once Daily 14)  Hydrocodone-Acetaminophen 10-325 Mg Tabs (Hydrocodone-Acetaminophen) .... 1/2 Tab Every 4 Hrs As Needed For Pain 15)  Vitamin D3 1000 Unit Caps  (Cholecalciferol) .Marland Kitchen.. 1 By Mouth Two Times A Day  Allergies (verified): 1)  ! Sulfa 2)  ! * Azithromycin 3)  ! * Tramadol 4)  ! Actos (Pioglitazone Hcl)  Past History:  Past Medical History: Last updated: 08/23/2009 CHEST PAIN (ICD-786.50) CLOSTRIDIUM DIFFICILE COLITIS (ICD-008.45) CAD, NATIVE VESSEL (ICD-414.01) RENAL INSUFFICIENCY, CHRONIC (ICD-585.9) HYPERTENSION (ICD-401.9) HYPERLIPIDEMIA (ICD-272.4) DIABETES MELLITUS, TYPE II (ICD-250.00) ACUTE CHOLECYSTITIS (ICD-575.0) DYSHIDROSIS (ICD-705.81) ROUTINE GENERAL MEDICAL EXAM@HEALTH  CARE FACL (ICD-V70.0) DIZZINESS (ICD-780.4) COUGH DUE TO ACE INHIBITORS (ICD-786.2) ENCOUNTER FOR LONG-TERM USE OF OTHER MEDICATIONS (ICD-V58.69) SECONDARY HYPERPARATHYROIDISM (ICD-588.81) HYPOTHYROIDISM (ICD-244.9) NECK PAIN (ICD-723.1) NECK MASS (ICD-784.2) GERD (ICD-530.81) URINARY INCONTINENCE (ICD-788.30) OSTEOPOROSIS (ICD-733.00) GOUT (ICD-274.9) ANEMIA-NOS (ICD-285.9) ALLERGIC RHINITIS (ICD-477.9)          Review of Systems  The patient denies dyspnea on exertion.         denies nasal congestion  Physical Exam  General:  morbidly obese.  no distress  Ears:  left TM is intact and clear with normal canal, and grossly normal hearing.   the external ear is slightly tender, at the entrance to the eac.  no erythema or drainage. Chest Wall:  nontender   Impression & Recommendations:  Problem # 1:  OTITIS EXTERNA (ICD-380.10) Assessment  New  Problem # 2:  CHEST PAIN (ICD-786.50) due to contusion.  much improved  Medications Added to Medication List This Visit: 1)  Vitamin D3 1000 Unit Caps (Cholecalciferol) .Marland Kitchen.. 1 by mouth two times a day 2)  Cephalexin 250 Mg Caps (Cephalexin) .Marland Kitchen.. 1 tab three times a day  Other Orders: Est. Patient Level III [id number])  Patient Instructions: 1)  cephalexin 250 mg three times a day 2)  no more treatment is necessary for your broken ribs.  Prescriptions: CEPHALEXIN 250 MG CAPS  (CEPHALEXIN) 1 tab three times a day  #21 x 0   Entered and Authorized by:   Donavan Foil MD   Signed by:   Donavan Foil MD on 03/04/2010   Method used:   Electronically to        [facility]. [address] [phone]* (retail)       [address]       [place], [place]  [address]       Ph: [phone] or [phone]       Fax: [phone]   RxID:   [id number]    Orders Added: 1)  Est. Patient Level III [id number]

## 2010-05-20 NOTE — Letter (Signed)
Summary: [facility]   Imported By: Bubba Hales 12/30/2009 08:57:00  _____________________________________________________________________  External Attachment:    Type:   Image     Comment:   External Document

## 2010-05-20 NOTE — Letter (Signed)
Summary: [facility]   Imported By: Bubba Hales 12/10/2009 10:45:37  _____________________________________________________________________  External Attachment:    Type:   Image     Comment:   External Document

## 2010-05-20 NOTE — Letter (Signed)
Summary: [facility]   Imported By: Phillis Knack 01/17/2010 09:33:54  _____________________________________________________________________  External Attachment:    Type:   Image     Comment:   External Document

## 2010-05-20 NOTE — Letter (Signed)
Summary: [facility] for [facility] for [facility]   Imported By: Phillis Knack 08/19/2009 10:44:02  _____________________________________________________________________  External Attachment:    Type:   Image     Comment:   External Document

## 2010-05-20 NOTE — Medication Information (Signed)
Summary: Omeprazole / [facility]  Omeprazole / [facility]   Imported By: Rise Patience 10/17/2009 11:41:24  _____________________________________________________________________  External Attachment:    Type:   Image     Comment:   External Document

## 2010-05-20 NOTE — Assessment & Plan Note (Signed)
Summary: F/U APPT/#/CD   Vital Signs:  Patient profile:   77 year old female Height:      62 inches (157.48 cm) Weight:      286 pounds (130 kg) BMI:     52.50 O2 Sat:      97 % on Room air Temp:     97.9 degrees F (36.61 degrees C) oral Pulse rate:   64 / minute BP sitting:   124 / 68  (right arm) Cuff size:   large  Vitals Entered By: Rebeca Alert MA (November 28, 2009 10:24 AM)  O2 Flow:  Room air CC: F/U visit/forms/pt fracture foot from fall on Monday/aj Is Patient Diabetic? Yes Comments Pt is no longer taking Zofran   Primary Jahi Roza:  Donavan Foil MD  CC:  F/U visit/forms/pt fracture foot from fall on Monday/aj.  History of Present Illness: foot fracture: ortho recommended keeping the boot/brace on for now.  htn:  she denies sob dm:  no cbg record, but states cbg's are well-controlled.  other than the above, pt states she feels well in general.  Current Medications (verified): 1)  Oxybutynin Chloride 5 Mg Tb24 (Oxybutynin Chloride) .... Take 1 Tablet By Mouth Once A Day 2)  Allopurinol 300 Mg  Tabs (Allopurinol) .... Take 1 By Mouth Qd 3)  Procrit 10000 Unit/ml  Soln (Epoetin Alfa) .... Dr Justin Mend 4)  Furosemide 20 Mg  Tabs (Furosemide) .... Take 1 By Mouth Qd 5)  Rocaltrol 0.5 Mcg  Caps (Calcitriol) .... Take 1 By Mouth Qd 6)  Zocor 80 Mg  Tabs (Simvastatin) .... Nightly 7)  Prilosec 20 Mg  Cpdr (Omeprazole) .... Take 2 Tablet By Mouth Once A Day 8)  Ecotrin Low Strength 81 Mg  Tbec (Aspirin) .Marland Kitchen.. 1 By Mouth Qd 9)  Tylenol Arthritis Pain 650 Mg  Tbcr (Acetaminophen) .... As Needed 10)  Triamcinolone Acetonide 0.1 %  Oint (Triamcinolone Acetonide) .... Apply Twice A Day As Needed Itching 11)  Metoprolol Succinate 25 Mg Xr24h-Tab (Metoprolol Succinate) .Marland Kitchen.. 1 Tab By Mouth 12)  Losartan Potassium-Hctz 100-25 Mg Tabs (Losartan Potassium-Hctz) .Marland Kitchen.. 1 Tab Once Daily 13)  Iron 325 (65 Fe) Mg Tabs (Ferrous Sulfate) .Marland Kitchen.. 1 Once Daily 14)  Miralax  Powd (Polyethylene  Glycol 3350) .Marland KitchenMarland KitchenMarland Kitchen 17 Grams Once Daily 15)  Zofran 4 Mg Tabs (Ondansetron Hcl) .... Take 1 Tablet By Mouth Four Times A Day As Needed  Allergies (verified): 1)  ! Sulfa 2)  ! * Azithromycin 3)  ! * Tramadol 4)  ! Actos (Pioglitazone Hcl)  Review of Systems  The patient denies syncope, weight loss, and weight gain.    Physical Exam  General:  obese.  no distress  Msk:  gait:  steady with a cane   Impression & Recommendations:  Problem # 1:  DIABETES MELLITUS, TYPE II (ICD-250.00) apparently well-controlled  Problem # 2:  foot pain due to fracture  Problem # 3:  HYPERTENSION (ICD-401.9) well-controlled  Medications Added to Medication List This Visit: 1)  Hydrocodone-acetaminophen 10-325 Mg Tabs (Hydrocodone-acetaminophen) .... 1/2 tab every 4 hrs as needed for pain  Other Orders: Form Completion [id number]) Est. Patient Level IV [id number]  Patient Instructions: 1)  please rescedule your upcoming bone-density test until your broken foot is better.   2)  vicodin 1/2 every 4 hrn as needed for pain 3)  same medication for blood pressure. 4)  Please schedule a "medicare wellness" appointment in 2 months. Prescriptions: HYDROCODONE-ACETAMINOPHEN 10-325 MG TABS (HYDROCODONE-ACETAMINOPHEN) 1/2 tab every  4 hrs as needed for pain  #50 x 2   Entered and Authorized by:   Donavan Foil MD   Signed by:   Donavan Foil MD on 11/28/2009   Method used:   Print then Give to Patient   RxID:   [id number]

## 2010-05-20 NOTE — Letter (Signed)
Summary: [facility]   Imported By: Phillis Knack 09/02/2009 11:11:49  _____________________________________________________________________  External Attachment:    Type:   Image     Comment:   External Document

## 2010-05-20 NOTE — Miscellaneous (Signed)
Summary: PT Order/[facility]  PT Order/[facility]   Imported By: Phillis Knack 10/09/2009 08:37:12  _____________________________________________________________________  External Attachment:    Type:   Image     Comment:   External Document

## 2010-05-20 NOTE — Assessment & Plan Note (Signed)
Summary: STILL HAVING SIDE PAIN AFTER FALL/ SAW SAE THURS/NWS   Vital Signs:  Patient profile:   77 year old female Height:      62 inches Weight:      286 pounds BMI:     52.50 O2 Sat:      95 % on Room air Temp:     97.2 degrees F oral Pulse rate:   60 / minute BP sitting:   122 / 72  (left arm) Cuff size:   large  Vitals Entered By: Shirlean Mylar Ewing CMA Deborra Medina) (December 02, 2009 2:17 PM)  O2 Flow:  Room air CC: Right  side pain/RE   Primary Care Provider:  Donavan Foil MD  CC:  Right  side pain/RE.  History of Present Illness: here to f/u - fell 1 wk ago, was visiting sisters home in [place] , got feet caught up in the mat walking in the door;  fell on her face and the right side of body iwth persistent pain, moderate to the right chest/side/right shoudler and right back;  has pain med per ortho for the foot but not working and cant take stronger pills, and has allergy to tramadol;  pain overall worse to deep breathe and cough, as well lift the right arm;  Pt denies other CP, sob, doe, wheezing, orthopnea, pnd, worsening LE edema, palps, dizziness or syncope .  Pt denies new neuro symptoms such as headache, facial or extremity weakness  No fever, wt loss, night sweats, loss of appetite or other constitutional symptoms   Problems Prior to Update: 1)  Pancytopenia  (ICD-284.1) 2)  Chest Pain  (ICD-786.50) 3)  Clostridium Difficile Colitis  (ICD-008.45) 4)  Cad, Native Vessel  (ICD-414.01) 5)  Renal Insufficiency, Chronic  (ICD-585.9) 6)  Hypertension  (ICD-401.9) 7)  Hyperlipidemia  (ICD-272.4) 8)  Diabetes Mellitus, Type II  (ICD-250.00) 9)  Acute Cholecystitis  (ICD-575.0) 10)  Dyshidrosis  (ICD-705.81) 11)  Routine General Medical Exam@health  Care Facl  (ICD-V70.0) 12)  Dizziness  (ICD-780.4) 13)  Cough Due To Ace Inhibitors  (ICD-786.2) 14)  Encounter For Long-term Use of Other Medications  (ICD-V58.69) 15)  Secondary Hyperparathyroidism  (ICD-588.81) 16)  Hypothyroidism   (ICD-244.9) 17)  Neck Pain  (ICD-723.1) 18)  Neck Mass  (ICD-784.2) 19)  Gerd  (ICD-530.81) 20)  Urinary Incontinence  (ICD-788.30) 21)  Osteoporosis  (ICD-733.00) 22)  Gout  (ICD-274.9) 23)  Allergic Rhinitis  (ICD-477.9)  Medications Prior to Update: 1)  Oxybutynin Chloride 5 Mg Tb24 (Oxybutynin Chloride) .... Take 1 Tablet By Mouth Once A Day 2)  Allopurinol 300 Mg  Tabs (Allopurinol) .... Take 1 By Mouth Qd 3)  Procrit 10000 Unit/ml  Soln (Epoetin Alfa) .... Dr Justin Mend 4)  Furosemide 20 Mg  Tabs (Furosemide) .... Take 1 By Mouth Qd 5)  Rocaltrol 0.5 Mcg  Caps (Calcitriol) .... Take 1 By Mouth Qd 6)  Zocor 80 Mg  Tabs (Simvastatin) .... Nightly 7)  Prilosec 20 Mg  Cpdr (Omeprazole) .... Take 2 Tablet By Mouth Once A Day 8)  Ecotrin Low Strength 81 Mg  Tbec (Aspirin) .Marland Kitchen.. 1 By Mouth Qd 9)  Triamcinolone Acetonide 0.1 %  Oint (Triamcinolone Acetonide) .... Apply Twice A Day As Needed Itching 10)  Metoprolol Succinate 25 Mg Xr24h-Tab (Metoprolol Succinate) .Marland Kitchen.. 1 Tab By Mouth 11)  Losartan Potassium-Hctz 100-25 Mg Tabs (Losartan Potassium-Hctz) .Marland Kitchen.. 1 Tab Once Daily 12)  Iron 325 (65 Fe) Mg Tabs (Ferrous Sulfate) .Marland Kitchen.. 1 Once Daily 13)  Miralax  Powd (Polyethylene Glycol 3350) .Marland KitchenMarland KitchenMarland Kitchen 17 Grams Once Daily 14)  Zofran 4 Mg Tabs (Ondansetron Hcl) .... Take 1 Tablet By Mouth Four Times A Day As Needed 15)  Hydrocodone-Acetaminophen 10-325 Mg Tabs (Hydrocodone-Acetaminophen) .... 1/2 Tab Every 4 Hrs As Needed For Pain  Current Medications (verified): 1)  Oxybutynin Chloride 5 Mg Tb24 (Oxybutynin Chloride) .... Take 1 Tablet By Mouth Once A Day 2)  Allopurinol 300 Mg  Tabs (Allopurinol) .... Take 1 By Mouth Qd 3)  Procrit 10000 Unit/ml  Soln (Epoetin Alfa) .... Dr Justin Mend 4)  Furosemide 20 Mg  Tabs (Furosemide) .... Take 1 By Mouth Qd 5)  Rocaltrol 0.5 Mcg  Caps (Calcitriol) .... Take 1 By Mouth Qd 6)  Zocor 80 Mg  Tabs (Simvastatin) .... Nightly 7)  Prilosec 20 Mg  Cpdr (Omeprazole) .... Take 2  Tablet By Mouth Once A Day 8)  Ecotrin Low Strength 81 Mg  Tbec (Aspirin) .Marland Kitchen.. 1 By Mouth Qd 9)  Triamcinolone Acetonide 0.1 %  Oint (Triamcinolone Acetonide) .... Apply Twice A Day As Needed Itching 10)  Metoprolol Succinate 25 Mg Xr24h-Tab (Metoprolol Succinate) .Marland Kitchen.. 1 Tab By Mouth 11)  Losartan Potassium-Hctz 100-25 Mg Tabs (Losartan Potassium-Hctz) .Marland Kitchen.. 1 Tab Once Daily 12)  Iron 325 (65 Fe) Mg Tabs (Ferrous Sulfate) .Marland Kitchen.. 1 Once Daily 13)  Miralax  Powd (Polyethylene Glycol 3350) .Marland KitchenMarland KitchenMarland Kitchen 17 Grams Once Daily 14)  Zofran 4 Mg Tabs (Ondansetron Hcl) .... Take 1 Tablet By Mouth Four Times A Day As Needed 15)  Hydrocodone-Acetaminophen 10-325 Mg Tabs (Hydrocodone-Acetaminophen) .... 1/2 Tab Every 4 Hrs As Needed For Pain  Allergies (verified): 1)  ! Sulfa 2)  ! * Azithromycin 3)  ! * Tramadol 4)  ! Actos (Pioglitazone Hcl)  Past History:  Past Medical History: Last updated: 08/23/2009 CHEST PAIN (ICD-786.50) CLOSTRIDIUM DIFFICILE COLITIS (ICD-008.45) CAD, NATIVE VESSEL (ICD-414.01) RENAL INSUFFICIENCY, CHRONIC (ICD-585.9) HYPERTENSION (ICD-401.9) HYPERLIPIDEMIA (ICD-272.4) DIABETES MELLITUS, TYPE II (ICD-250.00) ACUTE CHOLECYSTITIS (ICD-575.0) DYSHIDROSIS (ICD-705.81) ROUTINE GENERAL MEDICAL EXAM@HEALTH  CARE FACL (ICD-V70.0) DIZZINESS (ICD-780.4) COUGH DUE TO ACE INHIBITORS (ICD-786.2) ENCOUNTER FOR LONG-TERM USE OF OTHER MEDICATIONS (ICD-V58.69) SECONDARY HYPERPARATHYROIDISM (ICD-588.81) HYPOTHYROIDISM (ICD-244.9) NECK PAIN (ICD-723.1) NECK MASS (ICD-784.2) GERD (ICD-530.81) URINARY INCONTINENCE (ICD-788.30) OSTEOPOROSIS (ICD-733.00) GOUT (ICD-274.9) ANEMIA-NOS (ICD-285.9) ALLERGIC RHINITIS (ICD-477.9)          Past Surgical History: Last updated: 08/23/2009 C-Section (1977) (L) Aankle [phone]) EKG (02/01/2006) Bone Density (08/21/2005) Cholecystectomy total left knee in 2007  Social History: Last updated: 08/23/2009  Negative for any alcohol, tobacco.  She  is married.  I   met her husband again tonight during my assessment.  She lives at home  with him and has lived in [place] for over 30 years.      Risk Factors: Smoking Status: never (09/20/2006)  Review of Systems       all otherwise negative per pt -    Physical Exam  General:  alert and overweight-appearing.   Head:  normocephalic and atraumatic.   Eyes:  vision grossly intact, pupils equal, and pupils round.   Ears:  R ear normal and L ear normal.   Nose:  no external deformity and no nasal discharge.   Mouth:  no gingival abnormalities and pharynx pink and moist.   Neck:  supple and no masses.   Lungs:  normal respiratory effort and normal breath sounds.   Heart:  normal rate and regular rhythm.   Abdomen:  soft, non-tender, and normal bowel sounds.   Msk:  marked tender to right lower  costal margin and right side approx t8 level;  right shoulder nontender but marked pain to full abduct Extremities:  no edema, no erythema  Neurologic:  strength normal in all upper extremities and DTRs symmetrical and normal.     Impression & Recommendations:  Problem # 1:  CHEST PAIN (ICD-786.50)  cant r/o fx;  for cxr/rib films today,  add meloxicam as needed , Continue all other previous medications as before this visit   Orders: T-2 View CXR, Same Day (71020.5TC) T-Ribs Unilateral 2 Views (71100TC)  Problem # 2:  SHOULDER PAIN, RIGHT (ICD-719.41)  Her updated medication list for this problem includes:    Ecotrin Low Strength 81 Mg Tbec (Aspirin) .Marland Kitchen... 1 by mouth qd    Hydrocodone-acetaminophen 10-325 Mg Tabs (Hydrocodone-acetaminophen) .Marland Kitchen... 1/2 tab every 4 hrs as needed for pain    Meloxicam 15 Mg Tabs (Meloxicam) .Marland Kitchen... 1po once daily as needed with pain, but has FROM, doubt rotater cuff tear; ok to follow, consider PT if not improved in 1 -2 wks  Orders: T-Shoulder Right (73030TC)  Problem # 3:  HYPERTENSION (ICD-401.9)  Her updated medication list for this problem  includes:    Furosemide 20 Mg Tabs (Furosemide) .Marland Kitchen... Take 1 by mouth qd    Metoprolol Succinate 25 Mg Xr24h-tab (Metoprolol succinate) .Marland Kitchen... 1 tab by mouth    Losartan Potassium-hctz 100-25 Mg Tabs (Losartan potassium-hctz) .Marland Kitchen... 1 tab once daily  BP today: 122/72 Prior BP: 152/88 (10/09/2009)  Labs Reviewed: K+: 4.2 (09/04/2009) Creat: : 1.5 (09/04/2009)   Chol: 160 (12/26/2008)   HDL: 47.30 (12/26/2008)   LDL: 82 (12/26/2008)   TG: 152.0 (12/26/2008) stable overall by hx and exam, ok to continue meds/tx as is   Complete Medication List: 1)  Oxybutynin Chloride 5 Mg Tb24 (Oxybutynin chloride) .... Take 1 tablet by mouth once a day 2)  Allopurinol 300 Mg Tabs (Allopurinol) .... Take 1 by mouth qd 3)  Procrit 10000 Unit/ml Soln (Epoetin alfa) .... Dr webb 4)  Furosemide 20 Mg Tabs (Furosemide) .... Take 1 by mouth qd 5)  Rocaltrol 0.5 Mcg Caps (Calcitriol) .... Take 1 by mouth qd 6)  Zocor 80 Mg Tabs (Simvastatin) .... Nightly 7)  Prilosec 20 Mg Cpdr (Omeprazole) .... Take 2 tablet by mouth once a day 8)  Ecotrin Low Strength 81 Mg Tbec (Aspirin) .Marland Kitchen.. 1 by mouth qd 9)  Triamcinolone Acetonide 0.1 % Oint (Triamcinolone acetonide) .... Apply twice a day as needed itching 10)  Metoprolol Succinate 25 Mg Xr24h-tab (Metoprolol succinate) .Marland Kitchen.. 1 tab by mouth 11)  Losartan Potassium-hctz 100-25 Mg Tabs (Losartan potassium-hctz) .Marland Kitchen.. 1 tab once daily 12)  Iron 325 (65 Fe) Mg Tabs (Ferrous sulfate) .Marland Kitchen.. 1 once daily 13)  Miralax Powd (Polyethylene glycol 3350) .Marland KitchenMarland KitchenMarland Kitchen 17 grams once daily 14)  Zofran 4 Mg Tabs (Ondansetron hcl) .... Take 1 tablet by mouth four times a day as needed 15)  Hydrocodone-acetaminophen 10-325 Mg Tabs (Hydrocodone-acetaminophen) .... 1/2 tab every 4 hrs as needed for pain 16)  Meloxicam 15 Mg Tabs (Meloxicam) .Marland Kitchen.. 1po once daily as needed  Patient Instructions: 1)  Please take all new medications as prescribed 2)  Continue all previous medications as before this visit    3)  Please go to Radiology in the basement level for your X-Ray today  4)  Please call the number on the [facility] for results of your testing  5)  Please schedule an appointment with your primary doctor as needed Prescriptions: MELOXICAM 15 MG TABS (MELOXICAM)  1po once daily as needed  #30 x 2   Entered and Authorized by:   Biagio Borg MD   Signed by:   Biagio Borg MD on 12/02/2009   Method used:   Print then Give to Patient   RxID:   [id number]

## 2010-05-20 NOTE — Assessment & Plan Note (Signed)
Summary: 3 wk f/u #/ cd   Vital Signs:  Patient profile:   77 year old female Height:      62 inches (157.48 cm) Weight:      280 pounds (127.27 kg) O2 Sat:      89 % on Room air Temp:     98.8 degrees F (37.11 degrees C) oral Pulse rate:   78 / minute BP sitting:   140 / 70  (left arm) Cuff size:   large  Vitals Entered By: Gardenia Phlegm RMA (Sep 06, 2009 2:38 PM)  O2 Flow:  Room air CC: 3 week follow up/ CF Is Patient Diabetic? Yes   Primary Provider:  Donavan Foil MD  CC:  3 week follow up/ CF.  History of Present Illness: the status of at least 3 ongoing medical problems is addressed today: htn:  hyzaar was stopped in the hospital due to decreased bp med requirement.   edema is improved.  no sob dm:  no cbg record, but states cbg's are well-controlled.  she has lost a few lbs, due to her efforts. anemia:  she does not take fe tabs.  no brbpr. dyslipidemia:  she does not take colestid.  she says it gives her constipation.  Current Medications (verified): 1)  Oxybutynin Chloride 5 Mg Tb24 (Oxybutynin Chloride) .... Take 1 Tablet By Mouth Once A Day 2)  Allopurinol 300 Mg  Tabs (Allopurinol) .... Take 1 By Mouth Qd 3)  Procrit 10000 Unit/ml  Soln (Epoetin Alfa) .... Dr Justin Mend 4)  Furosemide 20 Mg  Tabs (Furosemide) .... Take 1 By Mouth Qd 5)  Rocaltrol 0.5 Mcg  Caps (Calcitriol) .... Take 1 By Mouth Qd 6)  Zocor 80 Mg  Tabs (Simvastatin) .... Nightly 7)  Prilosec 20 Mg  Cpdr (Omeprazole) .... Take 2 Tablet By Mouth Once A Day 8)  Ecotrin Low Strength 81 Mg  Tbec (Aspirin) .Marland Kitchen.. 1 By Mouth Qd 9)  Tylenol Arthritis Pain 650 Mg  Tbcr (Acetaminophen) .... As Needed 10)  Triamcinolone Acetonide 0.1 %  Oint (Triamcinolone Acetonide) .... Apply Twice A Day As Needed Itching 11)  Colestid 1 Gm Tabs (Colestipol Hcl) .... 3 Qd 12)  Screening Mammography 13)  Metoprolol Succinate 25 Mg Xr24h-Tab (Metoprolol Succinate) .Marland Kitchen.. 1 Tab By Mouth 14)  Ondansetron 4 Mg Tbdp (Ondansetron)  .Marland Kitchen.. 1 Tab Every 4 Hrs As Needed For Nausea  Allergies (verified): 1)  ! Sulfa 2)  ! * Azithromycin 3)  ! * Tramadol 4)  ! Hydrocodone 5)  ! Actos (Pioglitazone Hcl)  Past History:  Past Medical History: Last updated: 08/23/2009 CHEST PAIN (ICD-786.50) CLOSTRIDIUM DIFFICILE COLITIS (ICD-008.45) CAD, NATIVE VESSEL (ICD-414.01) RENAL INSUFFICIENCY, CHRONIC (ICD-585.9) HYPERTENSION (ICD-401.9) HYPERLIPIDEMIA (ICD-272.4) DIABETES MELLITUS, TYPE II (ICD-250.00) ACUTE CHOLECYSTITIS (ICD-575.0) DYSHIDROSIS (ICD-705.81) ROUTINE GENERAL MEDICAL EXAM@HEALTH  CARE FACL (ICD-V70.0) DIZZINESS (ICD-780.4) COUGH DUE TO ACE INHIBITORS (ICD-786.2) ENCOUNTER FOR LONG-TERM USE OF OTHER MEDICATIONS (ICD-V58.69) SECONDARY HYPERPARATHYROIDISM (ICD-588.81) HYPOTHYROIDISM (ICD-244.9) NECK PAIN (ICD-723.1) NECK MASS (ICD-784.2) GERD (ICD-530.81) URINARY INCONTINENCE (ICD-788.30) OSTEOPOROSIS (ICD-733.00) GOUT (ICD-274.9) ANEMIA-NOS (ICD-285.9) ALLERGIC RHINITIS (ICD-477.9)          Review of Systems  The patient denies hematuria.         constipation persists  Physical Exam  General:  morbidly obese.   Extremities:  trace right pedal edema and trace left pedal edema.   Additional Exam:  Hemoglobin           [L]  10.8 g/dL  12.0-15.0   Hematocrit           [L]  32.3 %    Impression & Recommendations:  Problem # 1:  HYPERTENSION (ICD-401.9) now needs resumption of her hyzaar  Problem # 2:  ANEMIA-NOS (ICD-285.9) needs increased rx  Problem # 3:  DIABETES MELLITUS, TYPE II (ICD-250.00) apparently well-controlled  Problem # 4:  HYPERLIPIDEMIA (ICD-272.4) therapy limited by noncompliance.  i'll do the best i can. uncertain etiology  Medications Added to Medication List This Visit: 1)  Losartan Potassium-hctz 100-25 Mg Tabs (Losartan potassium-hctz) .... 1/2 tab once daily 2)  Iron 325 (65 Fe) Mg Tabs (Ferrous sulfate) .Marland Kitchen.. 1 once daily 3)  Miralax Powd  (Polyethylene glycol 3350) .Marland KitchenMarland KitchenMarland Kitchen 17 grams once daily  Other Orders: Est. Patient Level IV [id number])  Patient Instructions: 1)  resume losartan-hctz, 100/25, at 1/2 pill once daily. 2)  stay-off actos and colestipol for now. 3)  take iron pill 1/day. 4)  Please schedule a follow-up appointment in 1 month. 5)  take "miralax" 17 grams once daily. 6)  cc dr Justin Mend.

## 2010-05-20 NOTE — Progress Notes (Signed)
Summary: [place] form  Phone Note Outgoing Call   Call placed by: Rebeca Alert MA,  November 04, 2009 11:37 AM Call placed to: Patient Details for Reason: OV due-Forms Summary of Call: Form sent in from [facility] for [place] Request--per MD, pt needs OV to complete form--informed pt Initial call taken by: Rebeca Alert MA,  November 04, 2009 11:38 AM  Follow-up for Phone Call        Appointment scheduled 11/22/2009 1:30pm Follow-up by: Rebeca Alert MA,  November 04, 2009 11:43 AM

## 2010-05-20 NOTE — Medication Information (Signed)
Summary: Prior [place] for Omeprazole/[facility]  Prior [place] for Omeprazole/[facility]   Imported By: Phillis Knack 10/16/2009 14:28:42  _____________________________________________________________________  External Attachment:    Type:   Image     Comment:   External Document

## 2010-05-20 NOTE — Progress Notes (Signed)
Summary: [facility]  Phone Note Outgoing Call   Summary of Call: Mailed completed paperwork to [facility] and sent a copy to scanning. Initial call taken by: Gardenia Phlegm RMA,  Aug 26, 2009 1:38 PM

## 2010-05-20 NOTE — Assessment & Plan Note (Signed)
Summary: SORE THROAT-SWOLLEN NECK ON SIDE  STC   Vital Signs:  Patient profile:   77 year old female Height:      62 inches (157.48 cm) Weight:      287.38 pounds (130.63 kg) O2 Sat:      96 % on Room air Temp:     97.8 degrees F (36.56 degrees C) oral Pulse rate:   75 / minute BP sitting:   118 / 80  (left arm) Cuff size:   large  Vitals Entered By: Gardenia Phlegm CMA (May 31, 2009 1:22 PM)  O2 Flow:  Room air CC: Sore throat/ Right side of neck is sore X3-4days/ CF Is Patient Diabetic? Yes   Primary Provider:  [facility]  CC:  Sore throat/ Right side of neck is sore X3-4days/ CF.  History of Present Illness: pt states few days sore throat, painful "nodes" at the right lateral neck, and nasal congestion.   she says she needs a larger quantity of tac cream.  she has diffuse itching. she "occasionally" takes actos.  Current Medications (verified): 1)  Oxybutynin Chloride 5 Mg Tb24 (Oxybutynin Chloride) .... Take 1 Tablet By Mouth Once A Day 2)  Allopurinol 300 Mg  Tabs (Allopurinol) .... Take 1 By Mouth Qd 3)  Procrit 10000 Unit/ml  Soln (Epoetin Alfa) .... Dr Justin Mend 4)  Furosemide 20 Mg  Tabs (Furosemide) .... Take 1 By Mouth Qd 5)  Rocaltrol 0.5 Mcg  Caps (Calcitriol) .... Take 1 By Mouth Qd 6)  Zocor 80 Mg  Tabs (Simvastatin) .... Nightly 7)  Prilosec 20 Mg  Cpdr (Omeprazole) .... Take 2 Tablet By Mouth Once A Day 8)  Ecotrin Low Strength 81 Mg  Tbec (Aspirin) .Marland Kitchen.. 1 By Mouth Qd 9)  Tylenol Arthritis Pain 650 Mg  Tbcr (Acetaminophen) .... As Needed 10)  Triamcinolone Acetonide 0.1 %  Oint (Triamcinolone Acetonide) .... Apply Twice A Day As Needed Itching 11)  Colestid 1 Gm Tabs (Colestipol Hcl) .... 3 Qd 12)  Screening Mammography 13)  Milk of Magnesia 7.75 % Susp (Magnesium Hydroxide) .... As Needed 14)  Losartan Potassium-Hctz 100-25 Mg Tabs (Losartan Potassium-Hctz) .Marland Kitchen.. 1 Qd 15)  Actos .... Once Daily  Allergies (verified): 1)  ! Sulfa 2)  ! * Azithromycin 3)   ! * Tramadol 4)  ! Hydrocodone 5)  ! Actos (Pioglitazone Hcl)  Past History:  Past Medical History: Last updated: 09/20/2006 Allergic rhinitis Anemia-NOS Diabetes mellitus, type II Gout Hyperlipidemia Hypertension Osteoporosis Renal insufficiency Urinary incontinence Diffuse OA  Review of Systems  The patient denies fever.    Physical Exam  General:  obese.  no distress  Head:  head: no deformity eyes: no periorbital swelling, no proptosis external nose and ears are normal mouth: no lesion seen Ears:  TM's intact and clear with normal canals with grossly normal hearing.   Lungs:  Clear to auscultation bilaterally. Normal respiratory effort.  Cervical Nodes:  there are a few slightly tender right anterolateral nodes   Impression & Recommendations:  Problem # 1:  uri  Problem # 2:  DIABETES MELLITUS, TYPE II (ICD-250.00) well-controlled  Problem # 3:  DYSHIDROSIS (ICD-705.81) well-controlled, but she needs a larger quantity of tac cream  Medications Added to Medication List This Visit: 1)  Actos  .... Once daily 2)  Cefuroxime Axetil 250 Mg Tabs (Cefuroxime axetil) .Marland Kitchen.. 1 two times a day  Other Orders: Prescription Created Electronically [phone]) Est. Patient Level IV [id number])  Patient Instructions: 1)  cefuroxime 250 mg  two times a day. 2)  i refilled the triamcinolone for a larger quantity. 3)  loratadine-d as needed for congestion. 4)  you can go off the actos. 5)  medicare wellness visit in 1-2 months. Prescriptions: TRIAMCINOLONE ACETONIDE 0.1 %  OINT (TRIAMCINOLONE ACETONIDE) APPLY TWICE A DAY as needed itching  #1 large tube x 5   Entered and Authorized by:   Donavan Foil MD   Signed by:   Donavan Foil MD on 05/31/2009   Method used:   Electronically to        [facility]. [address]. [phone]* (retail)       [address], [place]  [address]       Ph: [phone] or [phone]       Fax: [phone]   RxID:    [id number] CEFUROXIME AXETIL 250 MG TABS (CEFUROXIME AXETIL) 1 two times a day  #14 x 0   Entered and Authorized by:   Donavan Foil MD   Signed by:   Donavan Foil MD on 05/31/2009   Method used:   Electronically to        [facility]. [address] [phone]* (retail)       [address]       [place], [place]  [address]       Ph: [phone] or [phone]       Fax: [phone]   RxID:   [id number]

## 2010-05-20 NOTE — Progress Notes (Signed)
Summary: [facility]  Phone Note Outgoing Call   Summary of Call: Faxed completed paperwork to [facility] and sent a copy to be scanned. Initial call taken by: Gardenia Phlegm RMA,  July 10, 2009 8:44 AM

## 2010-05-20 NOTE — Miscellaneous (Signed)
Summary: SN Eval order/[facility]  SN Eval order/[facility]   Imported By: Phillis Knack 08/28/2009 13:35:06  _____________________________________________________________________  External Attachment:    Type:   Image     Comment:   External Document

## 2010-05-20 NOTE — Progress Notes (Signed)
Summary: Lab results    -  Date:  08/21/2009    WBC: 3.9    HGB: 8.9    HCT: 25.9    RBC: 2.83    PLT: 173    MCV: 92    RDW: 14.3    Neutrophil: 46    Lymphs: 32    Monos: 18    Eos: 4    Basophil: 0    BG Random: 159    BUN: 9    Creatinine: 1.90    Sodium: 127    Potassium: 5.5    Chloride: 93    CO2 Total: 30    Calcium: 9.9    GFR(Non African American): 26    GFR(African American): 30    AST: 10    ALT: 9    T Bili: 0.5

## 2010-05-20 NOTE — Progress Notes (Signed)
Summary: Mailed CT report to patient.   Mailed a copy of CT report to patient. The patient already signed release at the [place] office on 12/25/09(ROI in EMR). Marilynne Drivers  January 01, 2010 9:45 AM

## 2010-05-20 NOTE — Assessment & Plan Note (Signed)
Summary: FU--RIBS DISCUSSION--STC   Vital Signs:  Patient profile:   77 year old female Height:      62 inches (157.48 cm) Weight:      266 pounds (120.91 kg) BMI:     48.83 O2 Sat:      97 % on Room air Temp:     98.6 degrees F (37.00 degrees C) oral Pulse rate:   77 / minute BP sitting:   124 / 74  (left arm) Cuff size:   large  Vitals Entered By: Rebeca Alert MA (January 13, 2010 11:14 AM)  O2 Flow:  Room air CC: F/U appt/discuss ribs/pt is no longer taking Zofran/aj Is Patient Diabetic? Yes   Primary Provider:  Donavan Foil MD  CC:  F/U appt/discuss ribs/pt is no longer taking Zofran/aj.  History of Present Illness: the status of at least 3 ongoing medical problems is addressed today: rib fxs:  pt says her chest pain is 30% imptoved. dm:  she has lost a few lbs due to her efforts. dyslipidemia:  she has no chest pain, except for that assoc with #1  Current Medications (verified): 1)  Oxybutynin Chloride 5 Mg Tb24 (Oxybutynin Chloride) .... Take 1 Tablet By Mouth Once A Day 2)  Allopurinol 300 Mg  Tabs (Allopurinol) .... Take 1 By Mouth Qd 3)  Procrit 10000 Unit/ml  Soln (Epoetin Alfa) .... Dr Justin Mend 4)  Furosemide 20 Mg  Tabs (Furosemide) .... Take 1 By Mouth Qd 5)  Rocaltrol 0.5 Mcg  Caps (Calcitriol) .... Take 1 By Mouth Qd 6)  Zocor 80 Mg  Tabs (Simvastatin) .... Nightly 7)  Prilosec 20 Mg  Cpdr (Omeprazole) .... Take 2 Tablet By Mouth Once A Day 8)  Ecotrin Low Strength 81 Mg  Tbec (Aspirin) .Marland Kitchen.. 1 By Mouth Qd 9)  Triamcinolone Acetonide 0.1 %  Oint (Triamcinolone Acetonide) .... Apply Twice A Day As Needed Itching 10)  Metoprolol Succinate 25 Mg Xr24h-Tab (Metoprolol Succinate) .Marland Kitchen.. 1 Tab By Mouth 11)  Losartan Potassium-Hctz 100-25 Mg Tabs (Losartan Potassium-Hctz) .Marland Kitchen.. 1 Tab Once Daily 12)  Iron 325 (65 Fe) Mg Tabs (Ferrous Sulfate) .Marland Kitchen.. 1 Once Daily 13)  Miralax  Powd (Polyethylene Glycol 3350) .Marland KitchenMarland KitchenMarland Kitchen 17 Grams Once Daily 14)  Zofran 4 Mg Tabs (Ondansetron  Hcl) .... Take 1 Tablet By Mouth Four Times A Day As Needed 15)  Hydrocodone-Acetaminophen 10-325 Mg Tabs (Hydrocodone-Acetaminophen) .... 1/2 Tab Every 4 Hrs As Needed For Pain  Allergies (verified): 1)  ! Sulfa 2)  ! * Azithromycin 3)  ! * Tramadol 4)  ! Actos (Pioglitazone Hcl)  Past History:  Past Medical History: Last updated: 08/23/2009 CHEST PAIN (ICD-786.50) CLOSTRIDIUM DIFFICILE COLITIS (ICD-008.45) CAD, NATIVE VESSEL (ICD-414.01) RENAL INSUFFICIENCY, CHRONIC (ICD-585.9) HYPERTENSION (ICD-401.9) HYPERLIPIDEMIA (ICD-272.4) DIABETES MELLITUS, TYPE II (ICD-250.00) ACUTE CHOLECYSTITIS (ICD-575.0) DYSHIDROSIS (ICD-705.81) ROUTINE GENERAL MEDICAL EXAM@HEALTH  CARE FACL (ICD-V70.0) DIZZINESS (ICD-780.4) COUGH DUE TO ACE INHIBITORS (ICD-786.2) ENCOUNTER FOR LONG-TERM USE OF OTHER MEDICATIONS (ICD-V58.69) SECONDARY HYPERPARATHYROIDISM (ICD-588.81) HYPOTHYROIDISM (ICD-244.9) NECK PAIN (ICD-723.1) NECK MASS (ICD-784.2) GERD (ICD-530.81) URINARY INCONTINENCE (ICD-788.30) OSTEOPOROSIS (ICD-733.00) GOUT (ICD-274.9) ANEMIA-NOS (ICD-285.9) ALLERGIC RHINITIS (ICD-477.9)          Review of Systems  The patient denies syncope and dyspnea on exertion.    Physical Exam  General:  morbidly obese.  no distress  Chest Wall:  slightly tender at the right lat aspect Lungs:  Clear to auscultation bilaterally. Normal respiratory effort.    Impression & Recommendations:  Problem # 1:  DIABETES MELLITUS, TYPE II (  ICD-250.00)  Problem # 2:  HYPERLIPIDEMIA (B2193296.4)  Problem # 3:  rib fxs improved  Other Orders: Flu Vaccine 75yrs + MEDICARE PATIENTS [id number]) Administration Flu vaccine - MCR (G0008) Est. Patient Level III [id number])  Patient Instructions: 1)  continue the hydrocodone/acetaminophen as needed for chest pain. 2)  please schedule a "medicare wellness" appointment. 3)  good diet and exercise habits significanly improve the control of your diabetes.  please let me  know if you wish to be referred to a dietician.  high blood sugar is very risky to your health.  you should see an eye doctor every year. 4)  you should take an aspirin every day, unless you have been advised by a doctor not to.         Flu Vaccine Consent Questions     Do you have a history of severe allergic reactions to this vaccine? no    Any prior history of allergic reactions to egg and/or gelatin? no    Do you have a sensitivity to the preservative Thimersol? no    Do you have a past history of Guillan-Barre Syndrome? no    Do you currently have an acute febrile illness? no    Have you ever had a severe reaction to latex? no    Vaccine information given and explained to patient? yes    Are you currently pregnant? no    Lot Number:[id number]   Exp Date:10/18/2010   Site Given  Left Deltoid IMlu

## 2010-05-20 NOTE — Assessment & Plan Note (Signed)
Summary: pain r side/cd   Vital Signs:  Patient profile:   77 year old female Height:      62 inches (157.48 cm) Weight:      264.50 pounds (120.23 kg) BMI:     48.55 O2 Sat:      96 % on Room air Temp:     97.7 degrees F (36.50 degrees C) oral Pulse rate:   62 / minute BP sitting:   132 / 78  (left arm) Cuff size:   large  Vitals Entered By: Rebeca Alert MA (December 17, 2009 10:58 AM)  O2 Flow:  Room air CC: pt c/o pain on right side/pain near temples on both sides/pt is no longer taking Zofran/aj Is Patient Diabetic? Yes   Primary Laurynn Mccorvey:  Donavan Foil MD  CC:  pt c/o pain on right side/pain near temples on both sides/pt is no longer taking Zofran/aj.  History of Present Illness: pt says her right shoulder pain is improving, but few weeks of moderate right chest-wall pain persists.  no assoc sob. she also has a chronic headache, and wants to know if it is caused by ear infection.  Current Medications (verified): 1)  Oxybutynin Chloride 5 Mg Tb24 (Oxybutynin Chloride) .... Take 1 Tablet By Mouth Once A Day 2)  Allopurinol 300 Mg  Tabs (Allopurinol) .... Take 1 By Mouth Qd 3)  Procrit 10000 Unit/ml  Soln (Epoetin Alfa) .... Dr Justin Mend 4)  Furosemide 20 Mg  Tabs (Furosemide) .... Take 1 By Mouth Qd 5)  Rocaltrol 0.5 Mcg  Caps (Calcitriol) .... Take 1 By Mouth Qd 6)  Zocor 80 Mg  Tabs (Simvastatin) .... Nightly 7)  Prilosec 20 Mg  Cpdr (Omeprazole) .... Take 2 Tablet By Mouth Once A Day 8)  Ecotrin Low Strength 81 Mg  Tbec (Aspirin) .Marland Kitchen.. 1 By Mouth Qd 9)  Triamcinolone Acetonide 0.1 %  Oint (Triamcinolone Acetonide) .... Apply Twice A Day As Needed Itching 10)  Metoprolol Succinate 25 Mg Xr24h-Tab (Metoprolol Succinate) .Marland Kitchen.. 1 Tab By Mouth 11)  Losartan Potassium-Hctz 100-25 Mg Tabs (Losartan Potassium-Hctz) .Marland Kitchen.. 1 Tab Once Daily 12)  Iron 325 (65 Fe) Mg Tabs (Ferrous Sulfate) .Marland Kitchen.. 1 Once Daily 13)  Miralax  Powd (Polyethylene Glycol 3350) .Marland KitchenMarland KitchenMarland Kitchen 17 Grams Once Daily 14)   Zofran 4 Mg Tabs (Ondansetron Hcl) .... Take 1 Tablet By Mouth Four Times A Day As Needed 15)  Hydrocodone-Acetaminophen 10-325 Mg Tabs (Hydrocodone-Acetaminophen) .... 1/2 Tab Every 4 Hrs As Needed For Pain 16)  Meloxicam 15 Mg Tabs (Meloxicam) .Marland Kitchen.. 1po Once Daily As Needed  Allergies (verified): 1)  ! Sulfa 2)  ! * Azithromycin 3)  ! * Tramadol 4)  ! Actos (Pioglitazone Hcl)  Past History:  Past Medical History: Last updated: 08/23/2009 CHEST PAIN (ICD-786.50) CLOSTRIDIUM DIFFICILE COLITIS (ICD-008.45) CAD, NATIVE VESSEL (ICD-414.01) RENAL INSUFFICIENCY, CHRONIC (ICD-585.9) HYPERTENSION (ICD-401.9) HYPERLIPIDEMIA (ICD-272.4) DIABETES MELLITUS, TYPE II (ICD-250.00) ACUTE CHOLECYSTITIS (ICD-575.0) DYSHIDROSIS (ICD-705.81) ROUTINE GENERAL MEDICAL EXAM@HEALTH  CARE FACL (ICD-V70.0) DIZZINESS (ICD-780.4) COUGH DUE TO ACE INHIBITORS (ICD-786.2) ENCOUNTER FOR LONG-TERM USE OF OTHER MEDICATIONS (ICD-V58.69) SECONDARY HYPERPARATHYROIDISM (ICD-588.81) HYPOTHYROIDISM (ICD-244.9) NECK PAIN (ICD-723.1) NECK MASS (ICD-784.2) GERD (ICD-530.81) URINARY INCONTINENCE (ICD-788.30) OSTEOPOROSIS (ICD-733.00) GOUT (ICD-274.9) ANEMIA-NOS (ICD-285.9) ALLERGIC RHINITIS (ICD-477.9)          Review of Systems  The patient denies hemoptysis.         no change in minimal chronic cough.    Physical Exam  General:  morbidly obese.  no distress  Ears:  TM's intact  and clear with normal canals with grossly normal hearing.     Impression & Recommendations:  Problem # 1:  CHEST PAIN (ICD-786.50) Assessment Improved  Problem # 2:  RENAL INSUFFICIENCY, CHRONIC (ICD-585.9) she should discontinue meloxicam  Problem # 3:  headacne uncertain etiology  Other Orders: Radiology Referral (Radiology) Est. Patient Level IV [id number])  Patient Instructions: 1)  check ct of the chest. 2)  same pain medication. 3)  stop meloxicam, due to your kidneys. 4)  if your headache persists, i would be  happy to refer you to a specialist.

## 2010-05-20 NOTE — Assessment & Plan Note (Signed)
Summary: eph/ gd    Visit Type:  EPH Primary Provider:  Donavan Foil MD  CC:  pt weigth down 9 lb since 07/2009....edema/ankles/feet..no other complaints today.  History of Present Illness: [name] comes in today for followup after having acute cholecystectomy for acute cholecystitis.  I saw her preop for clearance. Please see my consultation note from April 14.  She's had a history of coronary artery disease and had a low stress Myoview in 2007. Ejection fraction was 56% with inferior Sopheap Boehle hypokinesia suggestive of ischemia. She was cleared medically.  I cleared her for surgery and she did fine postop.  She is back now has been released by surgery. She's had no angina or ischemic symptoms. I reviewed those with her again today. She denies orthopnea, PND or edema. She is back on all her medications that she was taking preop. Her primary care physician is Dr. Loanne Drilling.  Current Medications (verified): 1)  Oxybutynin Chloride 5 Mg Tb24 (Oxybutynin Chloride) .... Take 1 Tablet By Mouth Once A Day 2)  Allopurinol 300 Mg  Tabs (Allopurinol) .... Take 1 By Mouth Qd 3)  Procrit 10000 Unit/ml  Soln (Epoetin Alfa) .... Dr Justin Mend 4)  Furosemide 20 Mg  Tabs (Furosemide) .... Take 1 By Mouth Qd 5)  Rocaltrol 0.5 Mcg  Caps (Calcitriol) .... Take 1 By Mouth Qd 6)  Zocor 80 Mg  Tabs (Simvastatin) .... Nightly 7)  Prilosec 20 Mg  Cpdr (Omeprazole) .... Take 2 Tablet By Mouth Once A Day 8)  Ecotrin Low Strength 81 Mg  Tbec (Aspirin) .Marland Kitchen.. 1 By Mouth Qd 9)  Tylenol Arthritis Pain 650 Mg  Tbcr (Acetaminophen) .... As Needed 10)  Triamcinolone Acetonide 0.1 %  Oint (Triamcinolone Acetonide) .... Apply Twice A Day As Needed Itching 11)  Colestid 1 Gm Tabs (Colestipol Hcl) .... 3 Qd 12)  Screening Mammography 13)  Metoprolol Succinate 25 Mg Xr24h-Tab (Metoprolol Succinate) .Marland Kitchen.. 1 Tab By Mouth 14)  Ondansetron 4 Mg Tbdp (Ondansetron) .Marland Kitchen.. 1 Tab Every 4 Hrs As Needed For Nausea  Allergies: 1)  !  Sulfa 2)  ! * Azithromycin 3)  ! * Tramadol 4)  ! Hydrocodone 5)  ! Actos (Pioglitazone Hcl)  Review of Systems       negative other than history of present illness  Vital Signs:  Patient profile:   77 year old female Height:      62 inches Weight:      278 pounds BMI:     51.03 Pulse rate:   59 / minute Pulse rhythm:   irregular BP sitting:   126 / 60  (right arm) Cuff size:   large  Vitals Entered By: Julaine Hua, CMA (Sep 02, 2009 11:08 AM)  Physical Exam  General:  obese.   Head:  normocephalic and atraumatic Eyes:  PERRLA/EOM intact; conjunctiva and lids normal. Neck:  Neck supple, no JVD. No masses, thyromegaly or abnormal cervical nodes. Chest Sidda Humm:  no deformities or breast masses noted Lungs:  Clear bilaterally to auscultation and percussion. Heart:  PMI poorly appreciated, soft S1-S2, no gallop Msk:  decreased ROM.   Pulses:  pulses normal in all 4 extremities Extremities:  No clubbing or cyanosis. Neurologic:  Alert and oriented x 3. Skin:  Intact without lesions or rashes. Psych:  Normal affect.   EKG  Procedure date:  09/02/2009  Findings:      sinus bradycardia, low voltage, no change.  Impression & Recommendations:  Problem # 1:  CAD, NATIVE VESSEL (  ICD-414.01) Assessment Unchanged  Her updated medication list for this problem includes:    Ecotrin Low Strength 81 Mg Tbec (Aspirin) .Marland Kitchen... 1 by mouth qd    Metoprolol Succinate 25 Mg Xr24h-tab (Metoprolol succinate) .Marland Kitchen... 1 tab by mouth  Orders: EKG w/ Interpretation (93000)  Problem # 2:  RENAL INSUFFICIENCY, CHRONIC (ICD-585.9)  Problem # 3:  HYPERTENSION (ICD-401.9) Assessment: Improved  The following medications were removed from the medication list:    Losartan Potassium-hctz 100-25 Mg Tabs (Losartan potassium-hctz) .Marland Kitchen... 1 qd Her updated medication list for this problem includes:    Furosemide 20 Mg Tabs (Furosemide) .Marland Kitchen... Take 1 by mouth qd    Ecotrin Low Strength 81 Mg Tbec  (Aspirin) .Marland Kitchen... 1 by mouth qd    Metoprolol Succinate 25 Mg Xr24h-tab (Metoprolol succinate) .Marland Kitchen... 1 tab by mouth  Problem # 4:  HYPERLIPIDEMIA (ICD-272.4)  Her updated medication list for this problem includes:    Zocor 80 Mg Tabs (Simvastatin) ..... Nightly    Colestid 1 Gm Tabs (Colestipol hcl) .Marland KitchenMarland KitchenMarland Kitchen. 3 qd  Her updated medication list for this problem includes:    Zocor 80 Mg Tabs (Simvastatin) ..... Nightly    Colestid 1 Gm Tabs (Colestipol hcl) .Marland KitchenMarland KitchenMarland Kitchen. 3 qd  Problem # 5:  DIABETES MELLITUS, TYPE II (ICD-250.00)  The following medications were removed from the medication list:    Losartan Potassium-hctz 100-25 Mg Tabs (Losartan potassium-hctz) .Marland Kitchen... 1 qd Her updated medication list for this problem includes:    Ecotrin Low Strength 81 Mg Tbec (Aspirin) .Marland Kitchen... 1 by mouth qd  The following medications were removed from the medication list:    Losartan Potassium-hctz 100-25 Mg Tabs (Losartan potassium-hctz) .Marland Kitchen... 1 qd Her updated medication list for this problem includes:    Ecotrin Low Strength 81 Mg Tbec (Aspirin) .Marland Kitchen... 1 by mouth qd  Problem # 6:  ACUTE CHOLECYSTITIS (ICD-575.0) Assessment: Improved  Patient Instructions: 1)  Your physician recommends that you schedule a follow-up appointment in: [place] 2)  Your physician recommends that you continue on your current medications as directed. Please refer to the Current Medication list given to you today.

## 2010-05-20 NOTE — Progress Notes (Signed)
Summary: Lab call  Phone Note From Other Clinic   Caller: [facility] Summary of Call: Lab called to inform MD that lab attempted to draw blood from pt and was not successful. Pt refused to be stuck a second time. Per Alyse Low I informed Lab that it would be okay for pt to come back in a day or two to try blood draw again. Initial call taken by: Crissie Sickles, [place],  August 15, 2009 2:48 PM  Follow-up for Phone Call        noted, thank you Follow-up by: Donavan Foil MD,  August 16, 2009 1:04 PM

## 2010-05-20 NOTE — Progress Notes (Signed)
Summary: Xray results  Phone Note Call from Patient Call back at Home Phone [phone]   Caller: Patient Call For: Dr Jenny Reichmann Summary of Call: Pt calling for xray results, lab results are on phone tree but xray results are not. Please advise. Initial call taken by: Denice Paradise,  December 03, 2009 12:12 PM  Follow-up for Phone Call        normal, Pt informed Follow-up by: Crissie Sickles, [place],  December 03, 2009 1:06 PM

## 2010-05-20 NOTE — Progress Notes (Signed)
Summary: ALT med  Phone Note Call from Patient Call back at Home Phone [phone]   Caller: Patient Summary of Call: pt called requesting an alternative to Ondansetron while she waits for PA. please advise Initial call taken by: Crissie Sickles, [place],  August 16, 2009 8:43 AM  Follow-up for Phone Call        the pa would take time.  you can buy 36 pills for $10 at [facility].  do you want me to send there? Follow-up by: Donavan Foil MD,  August 16, 2009 1:03 PM  Additional Follow-up for Phone Call Additional follow up Details #1::        pt agreed to pharmacy change. pt is requesting an order for Home Care assistance while he recovers from surgery Additional Follow-up by: Crissie Sickles, [place],  August 16, 2009 1:32 PM    Additional Follow-up for Phone Call Additional follow up Details #2::    which [facility]? i sent home health referral. Follow-up by: Donavan Foil MD,  August 16, 2009 2:04 PM  Additional Follow-up for Phone Call Additional follow up Details #3:: Details for Additional Follow-up Action Taken: pt informed about referral. Rx sent to pharmacy Additional Follow-up by: Crissie Sickles, [place],  August 16, 2009 2:10 PM  Prescriptions: ONDANSETRON 4 MG TBDP (ONDANSETRON) 1 tab every 4 hrs as needed for nausea  #36 x 2   Entered by:   Crissie Sickles, CMA   Authorized by:   Donavan Foil MD   Signed by:   Crissie Sickles, CMA on 08/16/2009   Method used:   Electronically to        [facility] (retail)       [address]       [place], [address]  [address]       Ph: [phone]       Fax: [phone]   [id number]   Appended Document: ALT med Pharmacy called to inform that dissolvable tabs dispensed - per [facility] Pharmacist

## 2010-05-20 NOTE — Progress Notes (Signed)
Summary: FYI  Phone Note Other Incoming   Caller: Jordan Hawks RN w/ [facility] [phone] Summary of Call: RN called to inform MD that pt does not qualify for [facility] aid because she currently has a 24hr care giver in the home. RN states that she will continue to monitor pt's surgical site. Initial call taken by: Crissie Sickles, [place],  Aug 21, 2009 1:01 PM

## 2010-05-20 NOTE — Letter (Signed)
Summary: CMN for walker/[facility]  CMN for walker/[facility]   Imported By: Phillis Knack 11/22/2009 09:23:41  _____________________________________________________________________  External Attachment:    Type:   Image     Comment:   External Document

## 2010-05-20 NOTE — Letter (Signed)
Summary: [facility]   Imported By: Bubba Hales 09/10/2009 10:36:31  _____________________________________________________________________  External Attachment:    Type:   Image     Comment:   External Document

## 2010-05-20 NOTE — Medication Information (Signed)
Summary: Diabetes Testing Supplies/[facility]  Diabetes Testing Supplies/[facility]   Imported By: Phillis Knack 07/12/2009 10:26:09  _____________________________________________________________________  External Attachment:    Type:   Image     Comment:   External Document

## 2010-05-20 NOTE — Progress Notes (Signed)
Summary: Rx request  Phone Note Call from Patient Call back at Home Phone [phone]   Caller: Patient Summary of Call: pt called stating that she is out of her BP medication. Pt is requesting enough Losartan to last until appt with MD 07/28/09. Pt informed via VM that Rx 30 day supply sent to [facility] on file. Initial call taken by: Crissie Sickles, CMA,  July 26, 2009 9:33 AM    Prescriptions: LOSARTAN POTASSIUM-HCTZ 100-25 MG TABS (LOSARTAN POTASSIUM-HCTZ) 1 qd  #30 x 0   Entered by:   Crissie Sickles, CMA   Authorized by:   Donavan Foil MD   Signed by:   Crissie Sickles, CMA on 07/26/2009   Method used:   Electronically to        [facility]. [address] [phone]* (retail)       [address]       [place], [place]  [address]       Ph: [phone] or [phone]       Fax: [phone]   RxID:   [id number]

## 2010-05-20 NOTE — Miscellaneous (Signed)
Summary: Plan & Orders/[facility]  Plan & Orders/[facility]   Imported By: Bubba Hales 09/05/2009 12:21:51  _____________________________________________________________________  External Attachment:    Type:   Image     Comment:   External Document

## 2010-05-20 NOTE — Miscellaneous (Signed)
Summary: Stacy Walls   Imported By: Bubba Hales 09/04/2009 09:04:07  _____________________________________________________________________  External Attachment:    Type:   Image     Comment:   External Document

## 2010-05-20 NOTE — Assessment & Plan Note (Signed)
Summary: post hosp/cd   Vital Signs:  Patient profile:   77 year old female Height:      62 inches (157.48 cm) Weight:      287.38 pounds (130.63 kg) O2 Sat:      97 % on Room air Temp:     98.7 degrees F (37.06 degrees C) oral Pulse rate:   64 / minute BP sitting:   122 / 54  (left arm) Cuff size:   large  Vitals Entered By: Gardenia Phlegm RMA (August 15, 2009 1:30 PM)  O2 Flow:  Room air CC: [facility] (had gall bladder surgery)/ pt states she is no longer taking Colestid, Losartan Potassium, or Cefuroxime/ CF   Primary Provider:  Norins  CC:  [facility] (had gall bladder surgery)/ pt states she is no longer taking Colestid, Losartan Potassium, and or Cefuroxime/ CF.  History of Present Illness: the status of at least 3 ongoing medical problems is addressed today: diarrhea has resolved, and she has constipation. abdominal pain is much improved, but she still has nausea. she had anemia in the hospital.  denies brbpr. she has not taken her anemia injection since hosp d/c.  denies hematuria. leg edema persists  Current Medications (verified): 1)  Oxybutynin Chloride 5 Mg Tb24 (Oxybutynin Chloride) .... Take 1 Tablet By Mouth Once A Day 2)  Allopurinol 300 Mg  Tabs (Allopurinol) .... Take 1 By Mouth Qd 3)  Procrit 10000 Unit/ml  Soln (Epoetin Alfa) .... Dr Justin Mend 4)  Furosemide 20 Mg  Tabs (Furosemide) .... Take 1 By Mouth Qd 5)  Rocaltrol 0.5 Mcg  Caps (Calcitriol) .... Take 1 By Mouth Qd 6)  Zocor 80 Mg  Tabs (Simvastatin) .... Nightly 7)  Prilosec 20 Mg  Cpdr (Omeprazole) .... Take 2 Tablet By Mouth Once A Day 8)  Ecotrin Low Strength 81 Mg  Tbec (Aspirin) .Marland Kitchen.. 1 By Mouth Qd 9)  Tylenol Arthritis Pain 650 Mg  Tbcr (Acetaminophen) .... As Needed 10)  Triamcinolone Acetonide 0.1 %  Oint (Triamcinolone Acetonide) .... Apply Twice A Day As Needed Itching 11)  Colestid 1 Gm Tabs (Colestipol Hcl) .... 3 Qd 12)  Screening Mammography 13)  Milk of Magnesia 7.75 % Susp  (Magnesium Hydroxide) .... As Needed 14)  Losartan Potassium-Hctz 100-25 Mg Tabs (Losartan Potassium-Hctz) .Marland Kitchen.. 1 Qd 15)  Cefuroxime Axetil 250 Mg Tabs (Cefuroxime Axetil) .Marland Kitchen.. 1 Two Times A Day 16)  Metoprolol Succinate 25 Mg Xr24h-Tab (Metoprolol Succinate) .Marland Kitchen.. 1 Tab By Mouth 17)  Metronidazole 500 Mg Tabs (Metronidazole) .... Three Times A Day  Allergies (verified): 1)  ! Sulfa 2)  ! * Azithromycin 3)  ! * Tramadol 4)  ! Hydrocodone 5)  ! Actos (Pioglitazone Hcl)  Past History:  Past Medical History: Last updated: 09/20/2006 Allergic rhinitis Anemia-NOS Diabetes mellitus, type II Gout Hyperlipidemia Hypertension Osteoporosis Renal insufficiency Urinary incontinence Diffuse OA  Review of Systems  The patient denies fever.         denies vomiting  Physical Exam  General:  in wheelchair.  no distress  Lungs:  Clear to auscultation bilaterally. Normal respiratory effort.  Abdomen:  abdomen is soft, nontender.  no hepatosplenomegaly.   not distended.  no hernia    Impression & Recommendations:  Problem # 1:  ACUTE CHOLECYSTITIS (ICD-575.0) Assessment Improved  Problem # 2:  diarrhea due to c diff, better  Problem # 3:  ANEMIA-NOS (ICD-285.9) she is overdue for her procrit.  Problem # 4:  edema ? due to actos  Medications Added to Medication List This Visit: 1)  Metoprolol Succinate 25 Mg Xr24h-tab (Metoprolol succinate) .Marland Kitchen.. 1 tab by mouth 2)  Ondansetron 4 Mg Tbdp (Ondansetron) .Marland Kitchen.. 1 tab every 4 hrs as needed for nausea  Other Orders: TLB-BMP (Basic Metabolic Panel-BMET) (99991111) TLB-CBC Platelet - w/Differential (85025-CBCD) TLB-Hepatic/Liver Function Pnl (80076-HEPATIC) TLB-Amylase (82150-AMYL) TLB-IBC Pnl (Iron/FE;Transferrin) (83550-IBC) TLB-B12 + Folate Pnl [id number]) Est. Patient Level IV [id number]) Prescription Created Electronically [phone])  Patient Instructions: 1)  please stop milk of magnesia--it is not good for  kidney patients. 2)  take "miralax," 17 grams once daily 3)  take ondansetron 4 mg every 4 hrs as needed for nausea. 4)  blood tests today. 5)  keep appointment with dr Donne Hazel soon. 6)  stop actos. 7)  return 3 weeks Prescriptions: ZOCOR 80 MG  TABS (SIMVASTATIN) nightly  #90 x 3   Entered and Authorized by:   Donavan Foil MD   Signed by:   Donavan Foil MD on 08/15/2009   Method used:   Electronically to        [facility]* (mail-order)       [place]  [address]       Ph: [phone]       Fax: [phone]   RxID:   [id number] LOSARTAN POTASSIUM-HCTZ 100-25 MG TABS (LOSARTAN POTASSIUM-HCTZ) 1 qd  #90 x 3   Entered and Authorized by:   Donavan Foil MD   Signed by:   Donavan Foil MD on 08/15/2009   Method used:   Electronically to        [facility] (mail-order)       [place]  [address]       Ph: [phone]       Fax: [phone]   [id number] FUROSEMIDE 20 MG  TABS (FUROSEMIDE) take 1 by mouth qd  #90 x 3   Entered and Authorized by:   Donavan Foil MD   Signed by:   Donavan Foil MD on 08/15/2009   Method used:   Electronically to        [facility] (mail-order)       [place]  [address]       Ph: [phone]       Fax: [phone]   [id number] PRILOSEC 20 MG  CPDR (OMEPRAZOLE) Take 2 tablet by mouth once a day  #180 x 3   Entered and Authorized by:   Donavan Foil MD   Signed by:   Donavan Foil MD on 08/15/2009   Method used:   Electronically to        [facility] (mail-order)       [place]  [address]       Ph: [phone]       Fax: [phone]   [id number] ALLOPURINOL 300 MG  TABS (ALLOPURINOL) take 1 by mouth qd  #90 x 3   Entered and Authorized by:   Donavan Foil MD   Signed by:   Donavan Foil MD on 08/15/2009   Method used:    Electronically to        [facility]* (mail-[place])  [place]  [address]       Ph: [phone]       Fax: [phone]   [id number] OXYBUTYNIN CHLORIDE 5 MG TB24 (OXYBUTYNIN CHLORIDE) Take 1 tablet by mouth once a day  #90 x 3   Entered and Authorized by:   Donavan Foil MD   Signed by:   Donavan Foil MD on 08/15/2009   Method used:   Electronically to        [facility] (mail-order)       [place]  [address]       Ph: [phone]       Fax: [phone]   [id number] ROCALTROL 0.5 MCG  CAPS (CALCITRIOL) take 1 by mouth qd  #90 x 3   Entered and Authorized by:   Donavan Foil MD   Signed by:   Donavan Foil MD on 08/15/2009   Method used:   Electronically to        [facility] (mail-order)       [place]  [address]       Ph: [phone]       Fax: [phone]   [id number] ONDANSETRON 4 MG TBDP (ONDANSETRON) 1 tab every 4 hrs as needed for nausea  #36 x 2   Entered and Authorized by:   Donavan Foil MD   Signed by:   Donavan Foil MD on 08/15/2009   Method used:   Electronically to        [facility]. [address] [phone]* (retail)       [address]       [place], [place]  [address]       Ph: [phone] or [phone]       Fax: [phone]   RxID:   [id number]

## 2010-05-22 NOTE — Assessment & Plan Note (Signed)
Summary: head cold and congestion/sae/lb   Vital Signs:  Patient profile:   77 year old female Height:      62.5 inches Weight:      273.38 pounds BMI:     49.38 O2 Sat:      97 % on Room air Temp:     98.1 degrees F oral Pulse rate:   64 / minute BP sitting:   130 / 82  (left arm) Cuff size:   large  Vitals Entered By: Shirlean Mylar Ewing CMA ([place]) (April 15, 2010 2:30 PM)  O2 Flow:  Room air CC: Chest and head congestion/RE   Primary Care Provider:  Donavan Foil MD  CC:  Chest and head congestion/RE.  History of Present Illness: here for 3 days acute onset midl to mod ST, HA, general weakness and malaise,  with marked ST, facial pain,  and now prod cough greenish sputum ;  Pt denies CP, worsening sob, doe, wheezing, orthopnea, pnd, worsening LE edema, palps, dizziness or syncope   Pt denies new neuro symptoms such as  facial or extremity weakness Pt denies polydipsia, polyuria, or low sugar symptoms such as shakiness improved with eating.  Overall good compliance with meds, trying to follow low chol, DM diet, wt stable, little excercise however  No recent wt loss, night sweats, loss of appetite or other constitutional symptoms   Preventive Screening-Counseling & Management      Drug Use:  no.    Problems Prior to Update: 1)  Bronchitis-acute  (ICD-466.0) 2)  Sinusitis- Acute-nos  (ICD-461.9) 3)  Otitis Externa  (ICD-380.10) 4)  Shoulder Pain, Right  (ICD-719.41) 5)  Pancytopenia  (ICD-284.1) 6)  Chest Pain  (ICD-786.50) 7)  Clostridium Difficile Colitis  (ICD-008.45) 8)  Cad, Native Vessel  (ICD-414.01) 9)  Renal Insufficiency, Chronic  (ICD-585.9) 10)  Hypertension  (ICD-401.9) 11)  Hyperlipidemia  (ICD-272.4) 12)  Diabetes Mellitus, Type II  (ICD-250.00) 13)  Acute Cholecystitis  (ICD-575.0) 14)  Dyshidrosis  (ICD-705.81) 15)  Routine General Medical Exam@health  Care Facl  (ICD-V70.0) 16)  Dizziness  (ICD-780.4) 17)  Cough Due To Ace Inhibitors  (ICD-786.2) 18)   Encounter For Long-term Use of Other Medications  (ICD-V58.69) 19)  Secondary Hyperparathyroidism  (ICD-588.81) 20)  Hypothyroidism  (ICD-244.9) 21)  Neck Pain  (ICD-723.1) 22)  Neck Mass  (ICD-784.2) 23)  Gerd  (ICD-530.81) 24)  Urinary Incontinence  (ICD-788.30) 25)  Osteoporosis  (ICD-733.00) 26)  Gout  (ICD-274.9) 27)  Allergic Rhinitis  (ICD-477.9)  Medications Prior to Update: 1)  Oxybutynin Chloride 5 Mg Tb24 (Oxybutynin Chloride) .... Take 1 Tablet By Mouth Once A Day 2)  Allopurinol 300 Mg  Tabs (Allopurinol) .... Take 1 By Mouth Qd 3)  Procrit 10000 Unit/ml  Soln (Epoetin Alfa) .... Dr Justin Mend 4)  Furosemide 20 Mg  Tabs (Furosemide) .... Take 1 By Mouth Qd 5)  Rocaltrol 0.5 Mcg  Caps (Calcitriol) .... Take 1 By Mouth Qd 6)  Zocor 80 Mg  Tabs (Simvastatin) .... Nightly 7)  Prilosec 20 Mg  Cpdr (Omeprazole) .... Take 2 Tablet By Mouth Once A Day 8)  Ecotrin Low Strength 81 Mg  Tbec (Aspirin) .Marland Kitchen.. 1 By Mouth Qd 9)  Triamcinolone Acetonide 0.1 %  Oint (Triamcinolone Acetonide) .... Apply Twice A Day As Needed Itching 10)  Metoprolol Succinate 25 Mg Xr24h-Tab (Metoprolol Succinate) .Marland Kitchen.. 1 Tab By Mouth 11)  Losartan Potassium-Hctz 100-25 Mg Tabs (Losartan Potassium-Hctz) .Marland Kitchen.. 1 Tab Once Daily 12)  Iron 325 (65 Fe) Mg  Tabs (Ferrous Sulfate) .Marland Kitchen.. 1 Once Daily 13)  Miralax  Powd (Polyethylene Glycol 3350) .Marland KitchenMarland KitchenMarland Kitchen 17 Grams Once Daily 14)  Vitamin D3 1000 Unit Caps (Cholecalciferol) .Marland Kitchen.. 1 By Mouth Two Times A Day 15)  Cephalexin 250 Mg Caps (Cephalexin) .Marland Kitchen.. 1 Tab Three Times A Day  Current Medications (verified): 1)  Oxybutynin Chloride 5 Mg Tb24 (Oxybutynin Chloride) .... Take 1 Tablet By Mouth Once A Day 2)  Allopurinol 300 Mg  Tabs (Allopurinol) .... Take 1 By Mouth Qd 3)  Procrit 10000 Unit/ml  Soln (Epoetin Alfa) .... Dr Justin Mend 4)  Furosemide 20 Mg  Tabs (Furosemide) .... Take 1 By Mouth Qd 5)  Zocor 80 Mg  Tabs (Simvastatin) .... Nightly 6)  Prilosec 20 Mg  Cpdr (Omeprazole) ....  Take 2 Tablet By Mouth Once A Day 7)  Ecotrin Low Strength 81 Mg  Tbec (Aspirin) .Marland Kitchen.. 1 By Mouth Qd 8)  Triamcinolone Acetonide 0.1 %  Oint (Triamcinolone Acetonide) .... Apply Twice A Day As Needed Itching 9)  Metoprolol Succinate 25 Mg Xr24h-Tab (Metoprolol Succinate) .Marland Kitchen.. 1 Tab By Mouth 10)  Losartan Potassium-Hctz 100-25 Mg Tabs (Losartan Potassium-Hctz) .Marland Kitchen.. 1 Tab Once Daily 11)  Iron 325 (65 Fe) Mg Tabs (Ferrous Sulfate) .Marland Kitchen.. 1 Once Daily 12)  Miralax  Powd (Polyethylene Glycol 3350) .Marland KitchenMarland KitchenMarland Kitchen 17 Grams Once Daily 13)  Vitamin D3 1000 Unit Caps (Cholecalciferol) .Marland Kitchen.. 1 By Mouth Two Times A Day 14)  Doxycycline Hyclate 100 Mg Caps (Doxycycline Hyclate) .Marland Kitchen.. 1 By Mouth Two Times A Day 15)  Hydrocodone-Homatropine 5-1.5 Mg/38ml Syrp (Hydrocodone-Homatropine) .Marland Kitchen.. 1 Tsp By Mouth Q 6 Hrs As Needed Cough  Allergies (verified): 1)  ! Sulfa 2)  ! * Azithromycin 3)  ! * Tramadol 4)  ! Actos (Pioglitazone Hcl)  Past History:  Past Medical History: Last updated: 08/23/2009 CHEST PAIN (ICD-786.50) CLOSTRIDIUM DIFFICILE COLITIS (ICD-008.45) CAD, NATIVE VESSEL (ICD-414.01) RENAL INSUFFICIENCY, CHRONIC (ICD-585.9) HYPERTENSION (ICD-401.9) HYPERLIPIDEMIA (ICD-272.4) DIABETES MELLITUS, TYPE II (ICD-250.00) ACUTE CHOLECYSTITIS (ICD-575.0) DYSHIDROSIS (ICD-705.81) ROUTINE GENERAL MEDICAL EXAM@HEALTH  CARE FACL (ICD-V70.0) DIZZINESS (ICD-780.4) COUGH DUE TO ACE INHIBITORS (ICD-786.2) ENCOUNTER FOR LONG-TERM USE OF OTHER MEDICATIONS (ICD-V58.69) SECONDARY HYPERPARATHYROIDISM (ICD-588.81) HYPOTHYROIDISM (ICD-244.9) NECK PAIN (ICD-723.1) NECK MASS (ICD-784.2) GERD (ICD-530.81) URINARY INCONTINENCE (ICD-788.30) OSTEOPOROSIS (ICD-733.00) GOUT (ICD-274.9) ANEMIA-NOS (ICD-285.9) ALLERGIC RHINITIS (ICD-477.9)          Past Surgical History: Last updated: 08/23/2009 C-Section (1977) (L) Aankle [phone]) EKG (02/01/2006) Bone Density (08/21/2005) Cholecystectomy total left knee in  2007  Social History: Last updated: 04/15/2010  Negative for any alcohol, tobacco.  She is married.  I   met her husband again tonight during my assessment.  She lives at home  with him and has lived in [place] for over 30 years.     Drug use-no  Risk Factors: Smoking Status: never (09/20/2006)  Social History:  Negative for any alcohol, tobacco.  She is married.  I   met her husband again tonight during my assessment.  She lives at home  with him and has lived in [place] for over 30 years.     Drug use-no Drug Use:  no  Review of Systems       all otherwise negative per pt -    Physical Exam  General:  alert and overweight-appearing.  , mild ill  Head:  normocephalic and atraumatic.   Eyes:  vision grossly intact, pupils equal, and pupils round.   Ears:  bilat tm's red, sinus tender bilat Nose:  nasal dischargemucosal pallor and mucosal edema.  Mouth:  pharyngeal erythema and fair dentition.   Neck:  supple and no masses.   Lungs:  normal respiratory effort and normal breath sounds.   Heart:  normal rate and regular rhythm.   Extremities:  no edema, no erythema    Impression & Recommendations:  Problem # 1:  BRONCHITIS-ACUTE (ICD-466.0)  Her updated medication list for this problem includes:    Doxycycline Hyclate 100 Mg Caps (Doxycycline hyclate) .Marland Kitchen... 1 by mouth two times a day    Hydrocodone-homatropine 5-1.5 Mg/23ml Syrp (Hydrocodone-homatropine) .Marland Kitchen... 1 tsp by mouth q 6 hrs as needed cough treat as above, f/u any worsening signs or symptoms   Problem # 2:  SINUSITIS- ACUTE-NOS (ICD-461.9)  Her updated medication list for this problem includes:    Doxycycline Hyclate 100 Mg Caps (Doxycycline hyclate) .Marland Kitchen... 1 by mouth two times a day    Hydrocodone-homatropine 5-1.5 Mg/46ml Syrp (Hydrocodone-homatropine) .Marland Kitchen... 1 tsp by mouth q 6 hrs as needed cough treat as above, f/u any worsening signs or symptoms   Problem # 3:  HYPERTENSION (ICD-401.9) Assessment:  Unchanged  Her updated medication list for this problem includes:    Furosemide 20 Mg Tabs (Furosemide) .Marland Kitchen... Take 1 by mouth qd    Metoprolol Succinate 25 Mg Xr24h-tab (Metoprolol succinate) .Marland Kitchen... 1 tab by mouth    Losartan Potassium-hctz 100-25 Mg Tabs (Losartan potassium-hctz) .Marland Kitchen... 1 tab once daily  BP today: 130/82 Prior BP: 126/74 (03/04/2010)  Labs Reviewed: K+: 4.2 (09/04/2009) Creat: : 1.5 (09/04/2009)   Chol: 160 (12/26/2008)   HDL: 47.30 (12/26/2008)   LDL: 82 (12/26/2008)   TG: 152.0 (12/26/2008) stable overall by hx and exam, ok to continue meds/tx as is   Problem # 4:  DIABETES MELLITUS, TYPE II (ICD-250.00) Assessment: Unchanged  Her updated medication list for this problem includes:    Ecotrin Low Strength 81 Mg Tbec (Aspirin) .Marland Kitchen... 1 by mouth qd    Losartan Potassium-hctz 100-25 Mg Tabs (Losartan potassium-hctz) .Marland Kitchen... 1 tab once daily  Labs Reviewed: Creat: 1.5 (09/04/2009)    Reviewed HgBA1c results: 6.2 (04/08/2009)  6.0 (12/26/2008) stable overall by hx and exam, ok to continue meds/tx as is, pt to call for onset polys or cbg > 200  Complete Medication List: 1)  Oxybutynin Chloride 5 Mg Tb24 (Oxybutynin chloride) .... Take 1 tablet by mouth once a day 2)  Allopurinol 300 Mg Tabs (Allopurinol) .... Take 1 by mouth qd 3)  Procrit 10000 Unit/ml Soln (Epoetin alfa) .... Dr webb 4)  Furosemide 20 Mg Tabs (Furosemide) .... Take 1 by mouth qd 5)  Zocor 80 Mg Tabs (Simvastatin) .... Nightly 6)  Prilosec 20 Mg Cpdr (Omeprazole) .... Take 2 tablet by mouth once a day 7)  Ecotrin Low Strength 81 Mg Tbec (Aspirin) .Marland Kitchen.. 1 by mouth qd 8)  Triamcinolone Acetonide 0.1 % Oint (Triamcinolone acetonide) .... Apply twice a day as needed itching 9)  Metoprolol Succinate 25 Mg Xr24h-tab (Metoprolol succinate) .Marland Kitchen.. 1 tab by mouth 10)  Losartan Potassium-hctz 100-25 Mg Tabs (Losartan potassium-hctz) .Marland Kitchen.. 1 tab once daily 11)  Iron 325 (65 Fe) Mg Tabs (Ferrous sulfate) .Marland Kitchen.. 1 once  daily 12)  Miralax Powd (Polyethylene glycol 3350) .Marland KitchenMarland KitchenMarland Kitchen 17 grams once daily 13)  Vitamin D3 1000 Unit Caps (Cholecalciferol) .Marland Kitchen.. 1 by mouth two times a day 14)  Doxycycline Hyclate 100 Mg Caps (Doxycycline hyclate) .Marland Kitchen.. 1 by mouth two times a day 15)  Hydrocodone-homatropine 5-1.5 Mg/50ml Syrp (Hydrocodone-homatropine) .Marland Kitchen.. 1 tsp by mouth q 6 hrs as needed  cough  Patient Instructions: 1)  Please take all new medications as prescribed 2)  Continue all previous medications as before this visit  3)  Please schedule an appointment with your primary doctor as needed Prescriptions: HYDROCODONE-HOMATROPINE 5-1.5 MG/5ML SYRP (HYDROCODONE-HOMATROPINE) 1 tsp by mouth q 6 hrs as needed cough  #6oz x 1   Entered and Authorized by:   Biagio Borg MD   Signed by:   Biagio Borg MD on 04/15/2010   Method used:   Print then Give to Patient   RxID:   [id number] DOXYCYCLINE HYCLATE 100 MG CAPS (DOXYCYCLINE HYCLATE) 1 by mouth two times a day  #20 x 0   Entered and Authorized by:   Biagio Borg MD   Signed by:   Biagio Borg MD on 04/15/2010   Method used:   Print then Give to Patient   RxID:   [id number]    Orders Added: 1)  Est. Patient Level IV [id number]

## 2010-05-22 NOTE — Progress Notes (Signed)
  Phone Note Other Incoming   Request: Send information Summary of Call: Request for records received from [facility]. Request forwarded to [facility].

## 2010-05-27 ENCOUNTER — Other Ambulatory Visit: Payer: Self-pay | Admitting: Orthopedic Surgery

## 2010-05-27 ENCOUNTER — Encounter (HOSPITAL_COMMUNITY): Payer: Medicare Other | Attending: Nephrology

## 2010-05-27 ENCOUNTER — Other Ambulatory Visit: Payer: Self-pay

## 2010-05-27 DIAGNOSIS — D638 Anemia in other chronic diseases classified elsewhere: Secondary | ICD-10-CM | POA: Insufficient documentation

## 2010-05-27 DIAGNOSIS — N184 Chronic kidney disease, stage 4 (severe): Secondary | ICD-10-CM | POA: Insufficient documentation

## 2010-05-27 DIAGNOSIS — N2581 Secondary hyperparathyroidism of renal origin: Secondary | ICD-10-CM | POA: Insufficient documentation

## 2010-05-27 LAB — IRON AND TIBC
Iron: 62 ug/dL (ref 42–135)
TIBC: 215 ug/dL — ABNORMAL LOW (ref 250–470)

## 2010-05-27 LAB — FERRITIN: Ferritin: 402 ng/mL — ABNORMAL HIGH (ref 10–291)

## 2010-05-28 LAB — POCT HEMOGLOBIN-HEMACUE: Hemoglobin: 11.5 g/dL — ABNORMAL LOW (ref 12.0–15.0)

## 2010-06-26 ENCOUNTER — Other Ambulatory Visit: Payer: Self-pay | Admitting: Endocrinology

## 2010-06-26 ENCOUNTER — Other Ambulatory Visit: Payer: Medicare Other

## 2010-06-26 ENCOUNTER — Encounter: Payer: Self-pay | Admitting: Endocrinology

## 2010-06-26 ENCOUNTER — Encounter (HOSPITAL_COMMUNITY): Payer: Medicare Other | Attending: Nephrology

## 2010-06-26 ENCOUNTER — Other Ambulatory Visit: Payer: Self-pay | Admitting: Nephrology

## 2010-06-26 ENCOUNTER — Other Ambulatory Visit: Payer: Self-pay

## 2010-06-26 ENCOUNTER — Ambulatory Visit (INDEPENDENT_AMBULATORY_CARE_PROVIDER_SITE_OTHER): Payer: Medicare Other | Admitting: Endocrinology

## 2010-06-26 DIAGNOSIS — Z79899 Other long term (current) drug therapy: Secondary | ICD-10-CM

## 2010-06-26 DIAGNOSIS — D638 Anemia in other chronic diseases classified elsewhere: Secondary | ICD-10-CM | POA: Insufficient documentation

## 2010-06-26 DIAGNOSIS — E039 Hypothyroidism, unspecified: Secondary | ICD-10-CM

## 2010-06-26 DIAGNOSIS — D61810 Antineoplastic chemotherapy induced pancytopenia: Secondary | ICD-10-CM

## 2010-06-26 DIAGNOSIS — N184 Chronic kidney disease, stage 4 (severe): Secondary | ICD-10-CM | POA: Insufficient documentation

## 2010-06-26 DIAGNOSIS — T451X5A Adverse effect of antineoplastic and immunosuppressive drugs, initial encounter: Secondary | ICD-10-CM

## 2010-06-26 DIAGNOSIS — E119 Type 2 diabetes mellitus without complications: Secondary | ICD-10-CM

## 2010-06-26 DIAGNOSIS — E785 Hyperlipidemia, unspecified: Secondary | ICD-10-CM

## 2010-06-26 LAB — LIPID PANEL
Cholesterol: 192 mg/dL (ref 0–200)
HDL: 43.9 mg/dL (ref >39.00)
Total CHOL/HDL Ratio: 4
Triglycerides: 201 mg/dL — ABNORMAL HIGH (ref 0.0–149.0)
VLDL: 40.2 mg/dL — ABNORMAL HIGH (ref 0.0–40.0)

## 2010-06-26 LAB — IRON AND TIBC
Saturation Ratios: 35 % (ref 20–55)
TIBC: 235 ug/dL — ABNORMAL LOW (ref 250–470)
UIBC: 153 ug/dL

## 2010-06-26 LAB — HEPATIC FUNCTION PANEL
ALT: 16 U/L (ref 0–35)
AST: 21 U/L (ref 0–37)
Total Bilirubin: 0.9 mg/dL (ref 0.3–1.2)

## 2010-06-26 LAB — CBC WITH DIFFERENTIAL/PLATELET
Basophils Absolute: 0 10*3/uL (ref 0.0–0.1)
Hemoglobin: 12.3 g/dL (ref 12.0–15.0)
Lymphs Abs: 1.6 10*3/uL (ref 0.7–4.0)
MCHC: 33.7 g/dL (ref 30.0–36.0)
Platelets: 141 10*3/uL — ABNORMAL LOW (ref 150.0–400.0)
RBC: 3.87 Mil/uL (ref 3.87–5.11)
RDW: 13.4 % (ref 11.5–14.6)
WBC: 5.6 10*3/uL (ref 4.5–10.5)

## 2010-06-26 LAB — HEMOGLOBIN A1C: Hgb A1c MFr Bld: 8.3 % — ABNORMAL HIGH (ref 4.6–6.5)

## 2010-06-26 LAB — TSH: TSH: 4.19 u[IU]/mL (ref 0.35–5.50)

## 2010-06-27 ENCOUNTER — Telehealth: Payer: Self-pay | Admitting: Endocrinology

## 2010-06-30 LAB — IRON AND TIBC
Iron: 90 ug/dL (ref 42–135)
Saturation Ratios: 40 % (ref 20–55)

## 2010-06-30 LAB — PTH, INTACT AND CALCIUM: Calcium, Total (PTH): 10 mg/dL (ref 8.4–10.5)

## 2010-06-30 LAB — FERRITIN: Ferritin: 439 ng/mL — ABNORMAL HIGH (ref 10–291)

## 2010-06-30 LAB — POCT HEMOGLOBIN-HEMACUE: Hemoglobin: 11.2 g/dL — ABNORMAL LOW (ref 12.0–15.0)

## 2010-07-01 ENCOUNTER — Encounter: Payer: Self-pay | Admitting: Endocrinology

## 2010-07-01 LAB — RENAL FUNCTION PANEL
Albumin: 3.3 g/dL — ABNORMAL LOW (ref 3.5–5.2)
BUN: 28 mg/dL — ABNORMAL HIGH (ref 6–23)
Creatinine, Ser: 1.91 mg/dL — ABNORMAL HIGH (ref 0.4–1.2)
Phosphorus: 2.9 mg/dL (ref 2.3–4.6)
Potassium: 3.9 mEq/L (ref 3.5–5.1)

## 2010-07-01 LAB — PTH, INTACT AND CALCIUM
Calcium, Total (PTH): 10 mg/dL (ref 8.4–10.5)
PTH: 123.1 pg/mL — ABNORMAL HIGH (ref 14.0–72.0)

## 2010-07-01 LAB — IRON AND TIBC
Saturation Ratios: 35 % (ref 20–55)
TIBC: 204 ug/dL — ABNORMAL LOW (ref 250–470)

## 2010-07-01 NOTE — Progress Notes (Signed)
Summary: Rx   Phone Note Call from Patient Call back at Home Phone [phone]   Summary of Call: Pt called stating she is willing to start [place], and is requesting Rx to go to [facility] Initial call taken by: Crissie Sickles, [place],  June 27, 2010 9:51 AM  Follow-up for Phone Call        sent Follow-up by: Donavan Foil MD,  June 27, 2010 9:55 AM  Additional Follow-up for Phone Call Additional follow up Details #1::        Pt informed via VM Additional Follow-up by: Crissie Sickles, [place],  June 27, 2010 10:08 AM    New/Updated Medications: JANUVIA 100 MG TABS (SITAGLIPTIN PHOSPHATE) 1/2 tab once daily Prescriptions: JANUVIA 100 MG TABS (SITAGLIPTIN PHOSPHATE) 1/2 tab once daily  #15 x 5   Entered and Authorized by:   Donavan Foil MD   Signed by:   Donavan Foil MD on 06/27/2010   Method used:   Electronically to        [facility]. [address]. [phone]* (retail)       [address]       [place], [place]  [address]       Ph: [phone] or [phone]       Fax: [phone]   RxID:   [id number]

## 2010-07-01 NOTE — Assessment & Plan Note (Signed)
Summary: high blood sugar/lb   Vital Signs:  Patient profile:   77 year old female Height:      62.5 inches (158.75 cm) Weight:      170.50 pounds (77.50 kg) BMI:     30.80 O2 Sat:      93 % on Room air Temp:     98.7 degrees F (37.06 degrees C) oral Pulse rate:   75 / minute Pulse rhythm:   regular BP sitting:   108 / 74  (left arm) Cuff size:   large  Vitals Entered By: Rebeca Alert CMA Deborra Medina) (June 26, 2010 3:28 PM)  O2 Flow:  Room air CC: Elevated Blood Sugar over past 2 weeks (in the 200's)/refills on Triamcinolone cream and Losartan/aj Is Patient Diabetic? Yes Comments Pt is no longer taking Vitamin D or Miralax   Primary Provider:  Donavan Foil MD  CC:  Elevated Blood Sugar over past 2 weeks (in the 200's)/refills on Triamcinolone cream and Losartan/aj.  History of Present Illness: the status of at least 3 ongoing medical problems is addressed today: pancytopenia:  she gets procrit shots 1/month.  no brbpr. dm:  pt says she has been off all meds x 2 years.  no cbg record, but states cbg's are up to the 200's recently.  she has a few lbs of weight loss.   dyslipidemia: denies weight gain  Current Medications (verified): 1)  Oxybutynin Chloride 5 Mg Tb24 (Oxybutynin Chloride) .... Take 1 Tablet By Mouth Once A Day 2)  Allopurinol 300 Mg  Tabs (Allopurinol) .... Take 1 By Mouth Qd 3)  Procrit 10000 Unit/ml  Soln (Epoetin Alfa) .... Dr Justin Mend 4)  Furosemide 20 Mg  Tabs (Furosemide) .... Take 1 By Mouth Qd 5)  Zocor 80 Mg  Tabs (Simvastatin) .... Nightly 6)  Prilosec 20 Mg  Cpdr (Omeprazole) .... Take 2 Tablet By Mouth Once A Day 7)  Ecotrin Low Strength 81 Mg  Tbec (Aspirin) .Marland Kitchen.. 1 By Mouth Qd 8)  Triamcinolone Acetonide 0.1 %  Oint (Triamcinolone Acetonide) .... Apply Twice A Day As Needed Itching 9)  Metoprolol Succinate 25 Mg Xr24h-Tab (Metoprolol Succinate) .Marland Kitchen.. 1 Tab By Mouth 10)  Losartan Potassium-Hctz 100-25 Mg Tabs (Losartan Potassium-Hctz) .Marland Kitchen.. 1 Tab Once  Daily 11)  Iron 325 (65 Fe) Mg Tabs (Ferrous Sulfate) .Marland Kitchen.. 1 Once Daily 12)  Miralax  Powd (Polyethylene Glycol 3350) .Marland KitchenMarland KitchenMarland Kitchen 17 Grams Once Daily 13)  Vitamin D3 1000 Unit Caps (Cholecalciferol) .Marland Kitchen.. 1 By Mouth Two Times A Day  Allergies (verified): 1)  ! Sulfa 2)  ! * Azithromycin 3)  ! * Tramadol 4)  ! Actos (Pioglitazone Hcl)  Past History:  Past Medical History: Last updated: 08/23/2009 CHEST PAIN (ICD-786.50) CLOSTRIDIUM DIFFICILE COLITIS (ICD-008.45) CAD, NATIVE VESSEL (ICD-414.01) RENAL INSUFFICIENCY, CHRONIC (ICD-585.9) HYPERTENSION (ICD-401.9) HYPERLIPIDEMIA (ICD-272.4) DIABETES MELLITUS, TYPE II (ICD-250.00) ACUTE CHOLECYSTITIS (ICD-575.0) DYSHIDROSIS (ICD-705.81) ROUTINE GENERAL MEDICAL EXAM@HEALTH  CARE FACL (ICD-V70.0) DIZZINESS (ICD-780.4) COUGH DUE TO ACE INHIBITORS (ICD-786.2) ENCOUNTER FOR LONG-TERM USE OF OTHER MEDICATIONS (ICD-V58.69) SECONDARY HYPERPARATHYROIDISM (ICD-588.81) HYPOTHYROIDISM (ICD-244.9) NECK PAIN (ICD-723.1) NECK MASS (ICD-784.2) GERD (ICD-530.81) URINARY INCONTINENCE (ICD-788.30) OSTEOPOROSIS (ICD-733.00) GOUT (ICD-274.9) ANEMIA-NOS (ICD-285.9) ALLERGIC RHINITIS (ICD-477.9)          Family History: Reviewed history from 08/23/2009 and no changes required.  Significant for father has coronary artery disease, but   he was in his late 73s.  He subsequently died of a stroke.  She had a sister, who also died of a stroke.  dm:  father and 2 sibs  Review of Systems  The patient denies hematuria.         no depression.  denies polyuria  Physical Exam  Pulses:  dorsalis pedis intact bilat.    Extremities:  trace right pedal edema and trace left pedal edema.   no deformity.  no ulcer on the feet.  feet are of normal color and temp.  Neurologic:  sensation is intact to touch on the feet  Additional Exam:   Hemoglobin A1C       [H]  8.3 % White Cell Count          5.6 K/uL                    4.5-10.5 Hemoglobin                12.3  g/dL                   12.0-15.0 Hematocrit                36.6 %                      36.0-46.0 Platelet Count       [L]  141.0 K/uL  Cholesterol LDL   117.2 mg/dL   Impression & Recommendations:  Problem # 1:  PANCYTOPENIA (ICD-284.1) Assessment Improved  Problem # 2:  DIABETES MELLITUS, TYPE II (ICD-250.00) needs increased rx  Problem # 3:  HYPERLIPIDEMIA (ICD-272.4) needs increased rx  Other Orders: TLB-A1C / Hgb A1C (Glycohemoglobin) (83036-A1C) TLB-CBC Platelet - w/Differential (85025-CBCD) TLB-IBC Pnl (Iron/FE;Transferrin) (83550-IBC) TLB-Lipid Panel (80061-LIPID) TLB-Hepatic/Liver Function Pnl (80076-HEPATIC) TLB-TSH (Thyroid Stimulating Hormone) (84443-TSH) Est. Patient Level IV [id number])  Patient Instructions: 1)  blood tests are being ordered for you today.  please call [phone] to hear your test results.  based on the results, you may need to take insulin 2)  Please schedule a "medicare wellness" appointment.   3)  (update: i left message on phone-tree:  options are actos (although you have some edema already), or [place].  please verify you are on zocor). Prescriptions: LOSARTAN POTASSIUM-HCTZ 100-25 MG TABS (LOSARTAN POTASSIUM-HCTZ) 1 tab once daily  #30 x 11   Entered and Authorized by:   Donavan Foil MD   Signed by:   Donavan Foil MD on 06/26/2010   Method used:   Electronically to        [facility] (mail-order)       [place]  [address]       Ph: [phone]       Fax: [phone]   [id number] TRIAMCINOLONE ACETONIDE 0.1 %  OINT (TRIAMCINOLONE ACETONIDE) APPLY TWICE A DAY as needed itching  #1 large tube x 5   Entered and Authorized by:   Donavan Foil MD   Signed by:   Donavan Foil MD on 06/26/2010   Method used:   Electronically to        [facility] ([place]  [address]       Ph: [phone]       Fax: [phone]   RxID:    [id number]    Orders Added: 1)  TLB-A1C / Hgb A1C (Glycohemoglobin) [83036-A1C] 2)  TLB-CBC Platelet - w/Differential [85025-CBCD] 3)  TLB-IBC Pnl (Iron/FE;Transferrin) [83550-IBC] 4)  TLB-Lipid Panel [80061-LIPID] 5)  TLB-Hepatic/Liver Function Pnl [80076-HEPATIC] 6)  TLB-TSH (Thyroid Stimulating Hormone) [84443-TSH] 7)  Est. Patient Level IV [id number]

## 2010-07-03 LAB — IRON AND TIBC
Iron: 61 ug/dL (ref 42–135)
Saturation Ratios: 28 % (ref 20–55)
TIBC: 214 ug/dL — ABNORMAL LOW (ref 250–470)

## 2010-07-03 LAB — FERRITIN: Ferritin: 519 ng/mL — ABNORMAL HIGH (ref 10–291)

## 2010-07-03 LAB — POCT HEMOGLOBIN-HEMACUE: Hemoglobin: 11.5 g/dL — ABNORMAL LOW (ref 12.0–15.0)

## 2010-07-04 LAB — POCT HEMOGLOBIN-HEMACUE: Hemoglobin: 11.4 g/dL — ABNORMAL LOW (ref 12.0–15.0)

## 2010-07-04 LAB — IRON AND TIBC: UIBC: 166 ug/dL

## 2010-07-04 LAB — FERRITIN: Ferritin: 382 ng/mL — ABNORMAL HIGH (ref 10–291)

## 2010-07-06 LAB — POCT HEMOGLOBIN-HEMACUE
Hemoglobin: 11.1 g/dL — ABNORMAL LOW (ref 12.0–15.0)
Hemoglobin: 11.1 g/dL — ABNORMAL LOW (ref 12.0–15.0)
Hemoglobin: 11.5 g/dL — ABNORMAL LOW (ref 12.0–15.0)

## 2010-07-06 LAB — IRON AND TIBC
Iron: 62 ug/dL (ref 42–135)
Saturation Ratios: 23 % (ref 20–55)
UIBC: 207 ug/dL

## 2010-07-07 LAB — IRON AND TIBC: TIBC: 206 ug/dL — ABNORMAL LOW (ref 250–470)

## 2010-07-07 LAB — POCT HEMOGLOBIN-HEMACUE
Hemoglobin: 10.9 g/dL — ABNORMAL LOW (ref 12.0–15.0)
Hemoglobin: 9.6 g/dL — ABNORMAL LOW (ref 12.0–15.0)

## 2010-07-07 LAB — RENAL FUNCTION PANEL
Albumin: 2.9 g/dL — ABNORMAL LOW (ref 3.5–5.2)
BUN: 8 mg/dL (ref 6–23)
Calcium: 10 mg/dL (ref 8.4–10.5)
Phosphorus: 3.9 mg/dL (ref 2.3–4.6)
Potassium: 4.8 mEq/L (ref 3.5–5.1)
Sodium: 132 mEq/L — ABNORMAL LOW (ref 135–145)

## 2010-07-07 LAB — FERRITIN: Ferritin: 163 ng/mL (ref 10–291)

## 2010-07-08 LAB — CBC
HCT: 24.5 % — ABNORMAL LOW (ref 36.0–46.0)
HCT: 26.1 % — ABNORMAL LOW (ref 36.0–46.0)
HCT: 26.3 % — ABNORMAL LOW (ref 36.0–46.0)
Hemoglobin: 8.2 g/dL — ABNORMAL LOW (ref 12.0–15.0)
Hemoglobin: 8.8 g/dL — ABNORMAL LOW (ref 12.0–15.0)
Hemoglobin: 9 g/dL — ABNORMAL LOW (ref 12.0–15.0)
MCHC: 33.5 g/dL (ref 30.0–36.0)
MCHC: 34 g/dL (ref 30.0–36.0)
MCV: 95.5 fL (ref 78.0–100.0)
MCV: 95.9 fL (ref 78.0–100.0)
MCV: 96.6 fL (ref 78.0–100.0)
Platelets: 111 10*3/uL — ABNORMAL LOW (ref 150–400)
Platelets: 131 10*3/uL — ABNORMAL LOW (ref 150–400)
Platelets: 173 10*3/uL (ref 150–400)
RBC: 2.59 MIL/uL — ABNORMAL LOW (ref 3.87–5.11)
RBC: 2.73 MIL/uL — ABNORMAL LOW (ref 3.87–5.11)
RBC: 2.77 MIL/uL — ABNORMAL LOW (ref 3.87–5.11)
RDW: 13.9 % (ref 11.5–15.5)
RDW: 14 % (ref 11.5–15.5)
RDW: 14 % (ref 11.5–15.5)
RDW: 14.3 % (ref 11.5–15.5)
RDW: 14.4 % (ref 11.5–15.5)
WBC: 10.9 10*3/uL — ABNORMAL HIGH (ref 4.0–10.5)
WBC: 11.4 10*3/uL — ABNORMAL HIGH (ref 4.0–10.5)
WBC: 12.2 10*3/uL — ABNORMAL HIGH (ref 4.0–10.5)
WBC: 12.9 10*3/uL — ABNORMAL HIGH (ref 4.0–10.5)

## 2010-07-08 LAB — GLUCOSE, CAPILLARY
Glucose-Capillary: 100 mg/dL — ABNORMAL HIGH (ref 70–99)
Glucose-Capillary: 101 mg/dL — ABNORMAL HIGH (ref 70–99)
Glucose-Capillary: 104 mg/dL — ABNORMAL HIGH (ref 70–99)
Glucose-Capillary: 107 mg/dL — ABNORMAL HIGH (ref 70–99)
Glucose-Capillary: 109 mg/dL — ABNORMAL HIGH (ref 70–99)
Glucose-Capillary: 109 mg/dL — ABNORMAL HIGH (ref 70–99)
Glucose-Capillary: 114 mg/dL — ABNORMAL HIGH (ref 70–99)
Glucose-Capillary: 117 mg/dL — ABNORMAL HIGH (ref 70–99)
Glucose-Capillary: 118 mg/dL — ABNORMAL HIGH (ref 70–99)
Glucose-Capillary: 119 mg/dL — ABNORMAL HIGH (ref 70–99)
Glucose-Capillary: 125 mg/dL — ABNORMAL HIGH (ref 70–99)
Glucose-Capillary: 127 mg/dL — ABNORMAL HIGH (ref 70–99)
Glucose-Capillary: 128 mg/dL — ABNORMAL HIGH (ref 70–99)
Glucose-Capillary: 129 mg/dL — ABNORMAL HIGH (ref 70–99)
Glucose-Capillary: 130 mg/dL — ABNORMAL HIGH (ref 70–99)
Glucose-Capillary: 133 mg/dL — ABNORMAL HIGH (ref 70–99)
Glucose-Capillary: 138 mg/dL — ABNORMAL HIGH (ref 70–99)
Glucose-Capillary: 140 mg/dL — ABNORMAL HIGH (ref 70–99)
Glucose-Capillary: 178 mg/dL — ABNORMAL HIGH (ref 70–99)
Glucose-Capillary: 84 mg/dL (ref 70–99)
Glucose-Capillary: 96 mg/dL (ref 70–99)
Glucose-Capillary: 98 mg/dL (ref 70–99)
Glucose-Capillary: 98 mg/dL (ref 70–99)

## 2010-07-08 LAB — COMPREHENSIVE METABOLIC PANEL
ALT: 83 U/L — ABNORMAL HIGH (ref 0–35)
AST: 131 U/L — ABNORMAL HIGH (ref 0–37)
AST: 67 U/L — ABNORMAL HIGH (ref 0–37)
Albumin: 2.5 g/dL — ABNORMAL LOW (ref 3.5–5.2)
Alkaline Phosphatase: 62 U/L (ref 39–117)
Alkaline Phosphatase: 67 U/L (ref 39–117)
BUN: 49 mg/dL — ABNORMAL HIGH (ref 6–23)
CO2: 23 mEq/L (ref 19–32)
CO2: 24 mEq/L (ref 19–32)
Calcium: 8 mg/dL — ABNORMAL LOW (ref 8.4–10.5)
Chloride: 101 mEq/L (ref 96–112)
Chloride: 101 mEq/L (ref 96–112)
Creatinine, Ser: 3.38 mg/dL — ABNORMAL HIGH (ref 0.4–1.2)
GFR calc Af Amer: 16 mL/min — ABNORMAL LOW (ref >60)
GFR calc non Af Amer: 13 mL/min — ABNORMAL LOW (ref >60)
GFR calc non Af Amer: 15 mL/min — ABNORMAL LOW (ref >60)
Glucose, Bld: 123 mg/dL — ABNORMAL HIGH (ref 70–99)
Potassium: 3.9 mEq/L (ref 3.5–5.1)
Potassium: 4.6 mEq/L (ref 3.5–5.1)
Sodium: 136 mEq/L (ref 135–145)
Total Bilirubin: 1 mg/dL (ref 0.3–1.2)
Total Bilirubin: 1 mg/dL (ref 0.3–1.2)
Total Protein: 5.2 g/dL — ABNORMAL LOW (ref 6.0–8.3)
Total Protein: 5.3 g/dL — ABNORMAL LOW (ref 6.0–8.3)
Total Protein: 5.7 g/dL — ABNORMAL LOW (ref 6.0–8.3)

## 2010-07-08 LAB — BASIC METABOLIC PANEL
BUN: 33 mg/dL — ABNORMAL HIGH (ref 6–23)
Calcium: 9.6 mg/dL (ref 8.4–10.5)
Calcium: 9.7 mg/dL (ref 8.4–10.5)
Calcium: 9.8 mg/dL (ref 8.4–10.5)
Calcium: 9.8 mg/dL (ref 8.4–10.5)
Chloride: 100 mEq/L (ref 96–112)
Chloride: 100 mEq/L (ref 96–112)
Chloride: 105 mEq/L (ref 96–112)
Creatinine, Ser: 2.06 mg/dL — ABNORMAL HIGH (ref 0.4–1.2)
Creatinine, Ser: 2.2 mg/dL — ABNORMAL HIGH (ref 0.4–1.2)
Creatinine, Ser: 2.54 mg/dL — ABNORMAL HIGH (ref 0.4–1.2)
GFR calc Af Amer: 20 mL/min — ABNORMAL LOW (ref >60)
GFR calc Af Amer: 22 mL/min — ABNORMAL LOW (ref >60)
GFR calc Af Amer: 23 mL/min — ABNORMAL LOW (ref >60)
GFR calc Af Amer: 26 mL/min — ABNORMAL LOW (ref >60)
GFR calc non Af Amer: 18 mL/min — ABNORMAL LOW (ref >60)
GFR calc non Af Amer: 19 mL/min — ABNORMAL LOW (ref >60)
GFR calc non Af Amer: 24 mL/min — ABNORMAL LOW (ref >60)
Glucose, Bld: 94 mg/dL (ref 70–99)
Potassium: 3.3 mEq/L — ABNORMAL LOW (ref 3.5–5.1)
Potassium: 4.1 mEq/L (ref 3.5–5.1)
Sodium: 133 mEq/L — ABNORMAL LOW (ref 135–145)
Sodium: 134 mEq/L — ABNORMAL LOW (ref 135–145)
Sodium: 134 mEq/L — ABNORMAL LOW (ref 135–145)
Sodium: 136 mEq/L (ref 135–145)

## 2010-07-08 LAB — MISCELLANEOUS TEST

## 2010-07-08 LAB — POCT HEMOGLOBIN-HEMACUE: Hemoglobin: 8.8 g/dL — ABNORMAL LOW (ref 12.0–15.0)

## 2010-07-08 LAB — HEPATIC FUNCTION PANEL
ALT: 32 U/L (ref 0–35)
AST: 29 U/L (ref 0–37)
Alkaline Phosphatase: 75 U/L (ref 39–117)
Bilirubin, Direct: 0.2 mg/dL (ref 0.0–0.3)
Indirect Bilirubin: 0.4 mg/dL (ref 0.3–0.9)

## 2010-07-08 LAB — DIFFERENTIAL
Basophils Absolute: 0.1 10*3/uL (ref 0.0–0.1)
Eosinophils Relative: 4 % (ref 0–5)
Lymphocytes Relative: 13 % (ref 12–46)
Lymphs Abs: 1.4 10*3/uL (ref 0.7–4.0)
Monocytes Absolute: 0.7 10*3/uL (ref 0.1–1.0)
Neutro Abs: 8.4 10*3/uL — ABNORMAL HIGH (ref 1.7–7.7)

## 2010-07-08 LAB — IRON AND TIBC
Saturation Ratios: 29 % (ref 20–55)
UIBC: 124 ug/dL

## 2010-07-08 LAB — CLOSTRIDIUM DIFFICILE EIA
C difficile Toxins A+B, EIA: 23
C difficile Toxins A+B, EIA: NEGATIVE

## 2010-07-08 LAB — RENAL FUNCTION PANEL
Albumin: 2.8 g/dL — ABNORMAL LOW (ref 3.5–5.2)
BUN: 6 mg/dL (ref 6–23)
Calcium: 9.3 mg/dL (ref 8.4–10.5)
Glucose, Bld: 183 mg/dL — ABNORMAL HIGH (ref 70–99)
Phosphorus: 2.8 mg/dL (ref 2.3–4.6)
Potassium: 4.5 mEq/L (ref 3.5–5.1)
Sodium: 126 mEq/L — ABNORMAL LOW (ref 135–145)

## 2010-07-08 LAB — FERRITIN: Ferritin: 357 ng/mL — ABNORMAL HIGH (ref 10–291)

## 2010-07-08 LAB — FECAL LACTOFERRIN, QUANT

## 2010-07-08 NOTE — Medication Information (Signed)
Summary: Triamcinolon / [facility]  Triamcinolon / [facility]   Imported By: Rise Patience 07/04/2010 14:13:30  _____________________________________________________________________  External Attachment:    Type:   Image     Comment:   External Document

## 2010-07-09 LAB — CK TOTAL AND CKMB (NOT AT ARMC)
CK, MB: 0.7 ng/mL (ref 0.3–4.0)
Relative Index: INVALID (ref 0.0–2.5)
Total CK: 76 U/L (ref 7–177)

## 2010-07-09 LAB — CBC
HCT: 36.9 % (ref 36.0–46.0)
Hemoglobin: 12 g/dL (ref 12.0–15.0)
MCHC: 32.5 g/dL (ref 30.0–36.0)
MCV: 96.6 fL (ref 78.0–100.0)
Platelets: 116 10*3/uL — ABNORMAL LOW (ref 150–400)
RBC: 3.82 MIL/uL — ABNORMAL LOW (ref 3.87–5.11)
RDW: 14.5 % (ref 11.5–15.5)
WBC: 5.7 10*3/uL (ref 4.0–10.5)

## 2010-07-09 LAB — DIFFERENTIAL
Eosinophils Absolute: 0 10*3/uL (ref 0.0–0.7)
Lymphocytes Relative: 22 % (ref 12–46)
Lymphs Abs: 1.2 10*3/uL (ref 0.7–4.0)
Monocytes Relative: 8 % (ref 3–12)
Neutrophils Relative %: 69 % (ref 43–77)

## 2010-07-09 LAB — CARDIAC PANEL(CRET KIN+CKTOT+MB+TROPI)
CK, MB: 0.6 ng/mL (ref 0.3–4.0)
Relative Index: 0.5 (ref 0.0–2.5)
Relative Index: INVALID (ref 0.0–2.5)
Total CK: 78 U/L (ref 7–177)
Troponin I: 0.01 ng/mL (ref 0.00–0.06)
Troponin I: 0.02 ng/mL (ref 0.00–0.06)

## 2010-07-09 LAB — URINALYSIS, ROUTINE W REFLEX MICROSCOPIC
Bilirubin Urine: NEGATIVE
Glucose, UA: NEGATIVE mg/dL
Ketones, ur: NEGATIVE mg/dL
Leukocytes, UA: NEGATIVE
Nitrite: NEGATIVE
Protein, ur: NEGATIVE mg/dL
Specific Gravity, Urine: 1.012 (ref 1.005–1.030)
Urobilinogen, UA: 1 mg/dL (ref 0.0–1.0)
pH: 7 (ref 5.0–8.0)

## 2010-07-09 LAB — GLUCOSE, CAPILLARY
Glucose-Capillary: 144 mg/dL — ABNORMAL HIGH (ref 70–99)
Glucose-Capillary: 185 mg/dL — ABNORMAL HIGH (ref 70–99)
Glucose-Capillary: 208 mg/dL — ABNORMAL HIGH (ref 70–99)
Glucose-Capillary: 246 mg/dL — ABNORMAL HIGH (ref 70–99)

## 2010-07-09 LAB — HEPATIC FUNCTION PANEL
ALT: 15 U/L (ref 0–35)
ALT: 15 U/L (ref 0–35)
AST: 19 U/L (ref 0–37)
AST: 22 U/L (ref 0–37)
Albumin: 3.5 g/dL (ref 3.5–5.2)
Albumin: 3.7 g/dL (ref 3.5–5.2)
Alkaline Phosphatase: 86 U/L (ref 39–117)
Bilirubin, Direct: 0.2 mg/dL (ref 0.0–0.3)
Bilirubin, Direct: 0.2 mg/dL (ref 0.0–0.3)
Indirect Bilirubin: 0.5 mg/dL (ref 0.3–0.9)
Total Bilirubin: 0.7 mg/dL (ref 0.3–1.2)
Total Protein: 6.5 g/dL (ref 6.0–8.3)
Total Protein: 6.8 g/dL (ref 6.0–8.3)

## 2010-07-09 LAB — BASIC METABOLIC PANEL
CO2: 28 mEq/L (ref 19–32)
Calcium: 10.2 mg/dL (ref 8.4–10.5)
Calcium: 9.8 mg/dL (ref 8.4–10.5)
GFR calc Af Amer: 29 mL/min — ABNORMAL LOW (ref >60)
GFR calc non Af Amer: 24 mL/min — ABNORMAL LOW (ref >60)
Glucose, Bld: 222 mg/dL — ABNORMAL HIGH (ref 70–99)
Potassium: 3.4 mEq/L — ABNORMAL LOW (ref 3.5–5.1)
Sodium: 134 mEq/L — ABNORMAL LOW (ref 135–145)
Sodium: 137 mEq/L (ref 135–145)

## 2010-07-09 LAB — LIPASE, BLOOD: Lipase: 16 U/L (ref 11–59)

## 2010-07-09 LAB — URINE MICROSCOPIC-ADD ON

## 2010-07-09 LAB — POCT HEMOGLOBIN-HEMACUE
Hemoglobin: 10.3 g/dL — ABNORMAL LOW (ref 12.0–15.0)
Hemoglobin: 10.5 g/dL — ABNORMAL LOW (ref 12.0–15.0)

## 2010-07-09 LAB — BRAIN NATRIURETIC PEPTIDE: Pro B Natriuretic peptide (BNP): <30 pg/mL (ref 0.0–100.0)

## 2010-07-09 LAB — IRON AND TIBC: Saturation Ratios: 35 % (ref 20–55)

## 2010-07-09 LAB — POCT CARDIAC MARKERS
CKMB, poc: <1 ng/mL — ABNORMAL LOW (ref 1.0–8.0)
Myoglobin, poc: 126 ng/mL (ref 12–200)
Troponin i, poc: <0.05 ng/mL (ref 0.00–0.09)

## 2010-07-09 LAB — HEMOGLOBIN A1C: Hgb A1c MFr Bld: 6.1 % — ABNORMAL HIGH (ref <5.7)

## 2010-07-13 LAB — POCT HEMOGLOBIN-HEMACUE: Hemoglobin: 10.6 g/dL — ABNORMAL LOW (ref 12.0–15.0)

## 2010-07-13 LAB — IRON AND TIBC
Iron: 64 ug/dL (ref 42–135)
Saturation Ratios: 27 % (ref 20–55)
UIBC: 171 ug/dL

## 2010-07-21 LAB — RENAL FUNCTION PANEL
CO2: 32 mEq/L (ref 19–32)
Chloride: 102 mEq/L (ref 96–112)
Creatinine, Ser: 2.5 mg/dL — ABNORMAL HIGH (ref 0.4–1.2)
GFR calc Af Amer: 23 mL/min — ABNORMAL LOW (ref >60)
GFR calc non Af Amer: 19 mL/min — ABNORMAL LOW (ref >60)
Glucose, Bld: 132 mg/dL — ABNORMAL HIGH (ref 70–99)
Sodium: 140 mEq/L (ref 135–145)

## 2010-07-22 LAB — RENAL FUNCTION PANEL
Albumin: 3.6 g/dL (ref 3.5–5.2)
BUN: 62 mg/dL — ABNORMAL HIGH (ref 6–23)
Calcium: 10 mg/dL (ref 8.4–10.5)
Creatinine, Ser: 2.52 mg/dL — ABNORMAL HIGH (ref 0.4–1.2)
GFR calc Af Amer: 23 mL/min — ABNORMAL LOW (ref >60)
GFR calc non Af Amer: 19 mL/min — ABNORMAL LOW (ref >60)

## 2010-07-22 LAB — FERRITIN: Ferritin: 219 ng/mL (ref 10–291)

## 2010-07-22 LAB — IRON AND TIBC
Iron: 79 ug/dL (ref 42–135)
Saturation Ratios: 31 % (ref 20–55)
TIBC: 255 ug/dL (ref 250–470)

## 2010-07-23 LAB — POCT HEMOGLOBIN-HEMACUE: Hemoglobin: 11.8 g/dL — ABNORMAL LOW (ref 12.0–15.0)

## 2010-07-23 LAB — URINALYSIS, ROUTINE W REFLEX MICROSCOPIC
Glucose, UA: NEGATIVE mg/dL
Nitrite: NEGATIVE
Protein, ur: NEGATIVE mg/dL
pH: 7 (ref 5.0–8.0)

## 2010-07-23 LAB — URINE MICROSCOPIC-ADD ON

## 2010-07-23 LAB — IRON AND TIBC: Saturation Ratios: 29 % (ref 20–55)

## 2010-07-23 LAB — URINE CULTURE: Colony Count: >=100000

## 2010-07-23 LAB — FERRITIN: Ferritin: 217 ng/mL (ref 10–291)

## 2010-07-24 ENCOUNTER — Encounter (HOSPITAL_COMMUNITY): Payer: Medicare Other

## 2010-07-24 LAB — RENAL FUNCTION PANEL
Albumin: 3.5 g/dL (ref 3.5–5.2)
Albumin: 3.6 g/dL (ref 3.5–5.2)
BUN: 43 mg/dL — ABNORMAL HIGH (ref 6–23)
CO2: 29 mEq/L (ref 19–32)
Calcium: 10.1 mg/dL (ref 8.4–10.5)
Creatinine, Ser: 2.4 mg/dL — ABNORMAL HIGH (ref 0.4–1.2)
Creatinine, Ser: 2.7 mg/dL — ABNORMAL HIGH (ref 0.4–1.2)
GFR calc Af Amer: 24 mL/min — ABNORMAL LOW (ref >60)
GFR calc non Af Amer: 20 mL/min — ABNORMAL LOW (ref >60)
Glucose, Bld: 122 mg/dL — ABNORMAL HIGH (ref 70–99)
Phosphorus: 3 mg/dL (ref 2.3–4.6)
Phosphorus: 3.2 mg/dL (ref 2.3–4.6)
Potassium: 4 mEq/L (ref 3.5–5.1)
Sodium: 137 mEq/L (ref 135–145)

## 2010-07-24 LAB — URINALYSIS, ROUTINE W REFLEX MICROSCOPIC
Bilirubin Urine: NEGATIVE
Ketones, ur: NEGATIVE mg/dL
Nitrite: NEGATIVE
Protein, ur: 30 mg/dL — AB
Urobilinogen, UA: 0.2 mg/dL (ref 0.0–1.0)
pH: 5.5 (ref 5.0–8.0)

## 2010-07-24 LAB — IRON AND TIBC
Iron: 76 ug/dL (ref 42–135)
Saturation Ratios: 29 % (ref 20–55)
TIBC: 258 ug/dL (ref 250–470)
UIBC: 182 ug/dL

## 2010-07-24 LAB — POCT HEMOGLOBIN-HEMACUE: Hemoglobin: 11.9 g/dL — ABNORMAL LOW (ref 12.0–15.0)

## 2010-07-24 LAB — URINE MICROSCOPIC-ADD ON

## 2010-07-24 LAB — FERRITIN: Ferritin: 188 ng/mL (ref 10–291)

## 2010-07-25 LAB — IRON AND TIBC
Iron: 79 ug/dL (ref 42–135)
TIBC: 240 ug/dL — ABNORMAL LOW (ref 250–470)
UIBC: 161 ug/dL

## 2010-07-25 LAB — RENAL FUNCTION PANEL
Albumin: 3.4 g/dL — ABNORMAL LOW (ref 3.5–5.2)
BUN: 26 mg/dL — ABNORMAL HIGH (ref 6–23)
Calcium: 10.6 mg/dL — ABNORMAL HIGH (ref 8.4–10.5)
Glucose, Bld: 126 mg/dL — ABNORMAL HIGH (ref 70–99)
Phosphorus: 3 mg/dL (ref 2.3–4.6)
Sodium: 136 mEq/L (ref 135–145)

## 2010-07-25 LAB — FERRITIN: Ferritin: 221 ng/mL (ref 10–291)

## 2010-07-25 LAB — POCT HEMOGLOBIN-HEMACUE: Hemoglobin: 11.4 g/dL — ABNORMAL LOW (ref 12.0–15.0)

## 2010-07-26 LAB — IRON AND TIBC
Iron: 67 ug/dL (ref 42–135)
Saturation Ratios: 28 % (ref 20–55)
UIBC: 174 ug/dL

## 2010-07-26 LAB — FERRITIN: Ferritin: 204 ng/mL (ref 10–291)

## 2010-07-27 LAB — IRON AND TIBC
Iron: 76 ug/dL (ref 42–135)
UIBC: 151 ug/dL

## 2010-07-27 LAB — FERRITIN: Ferritin: 215 ng/mL (ref 10–291)

## 2010-07-27 LAB — POCT HEMOGLOBIN-HEMACUE: Hemoglobin: 11 g/dL — ABNORMAL LOW (ref 12.0–15.0)

## 2010-07-28 LAB — IRON AND TIBC
Iron: 72 ug/dL (ref 42–135)
TIBC: 241 ug/dL — ABNORMAL LOW (ref 250–470)

## 2010-07-28 LAB — POCT HEMOGLOBIN-HEMACUE: Hemoglobin: 10.7 g/dL — ABNORMAL LOW (ref 12.0–15.0)

## 2010-07-29 LAB — IRON AND TIBC
Iron: 92 ug/dL (ref 42–135)
TIBC: 262 ug/dL (ref 250–470)
UIBC: 170 ug/dL

## 2010-07-29 LAB — POCT HEMOGLOBIN-HEMACUE: Hemoglobin: 11 g/dL — ABNORMAL LOW (ref 12.0–15.0)

## 2010-07-29 LAB — FERRITIN: Ferritin: 119 ng/mL (ref 10–291)

## 2010-07-30 ENCOUNTER — Other Ambulatory Visit: Payer: Self-pay | Admitting: Nephrology

## 2010-07-30 ENCOUNTER — Encounter (HOSPITAL_COMMUNITY)
Admission: RE | Admit: 2010-07-30 | Discharge: 2010-07-30 | Disposition: A | Discharge status: Home / Self Care | Payer: Medicare Other | Source: Ambulatory Visit | Attending: Nephrology | Admitting: Nephrology

## 2010-07-30 DIAGNOSIS — N184 Chronic kidney disease, stage 4 (severe): Secondary | ICD-10-CM | POA: Insufficient documentation

## 2010-07-30 DIAGNOSIS — D638 Anemia in other chronic diseases classified elsewhere: Secondary | ICD-10-CM | POA: Insufficient documentation

## 2010-07-30 LAB — FERRITIN: Ferritin: 111 ng/mL (ref 10–291)

## 2010-07-30 LAB — IRON AND TIBC
Iron: 69 ug/dL (ref 42–135)
Saturation Ratios: 31 % (ref 20–55)
TIBC: 222 ug/dL — ABNORMAL LOW (ref 250–470)
UIBC: 181 ug/dL

## 2010-07-30 LAB — POCT HEMOGLOBIN-HEMACUE: Hemoglobin: 11 g/dL — ABNORMAL LOW (ref 12.0–15.0)

## 2010-07-31 LAB — POCT HEMOGLOBIN-HEMACUE: Hemoglobin: 11.5 g/dL — ABNORMAL LOW (ref 12.0–15.0)

## 2010-07-31 LAB — IRON AND TIBC
Saturation Ratios: 31 % (ref 20–55)
UIBC: 189 ug/dL

## 2010-07-31 LAB — FERRITIN: Ferritin: 134 ng/mL (ref 10–291)

## 2010-08-04 LAB — IRON AND TIBC: Iron: 87 ug/dL (ref 42–135)

## 2010-08-04 LAB — POCT HEMOGLOBIN-HEMACUE
Hemoglobin: 9.6 g/dL — ABNORMAL LOW (ref 12.0–15.0)
Hemoglobin: 10.6 g/dL — ABNORMAL LOW (ref 12.0–15.0)

## 2010-08-05 LAB — IRON AND TIBC
Iron: 79 ug/dL (ref 42–135)
Saturation Ratios: 29 % (ref 20–55)

## 2010-08-05 LAB — POCT HEMOGLOBIN-HEMACUE: Hemoglobin: 11.1 g/dL — ABNORMAL LOW (ref 12.0–15.0)

## 2010-08-19 ENCOUNTER — Other Ambulatory Visit: Payer: Self-pay

## 2010-08-19 MED ORDER — OXYBUTYNIN CHLORIDE 5 MG PO TABS: 5.0000 mg | ORAL_TABLET | Freq: Every day | ORAL | Status: DC

## 2010-08-26 ENCOUNTER — Encounter: Payer: Self-pay | Admitting: Endocrinology

## 2010-08-26 ENCOUNTER — Other Ambulatory Visit: Payer: Self-pay | Admitting: *Deleted

## 2010-08-26 MED ORDER — FUROSEMIDE 20 MG PO TABS: ORAL_TABLET | ORAL | Status: DC

## 2010-08-26 NOTE — Telephone Encounter (Signed)
R'cd fax from [facility] for refill of pt's Furosemide  Last OV-06/26/2010  Last Filled-08/15/2009

## 2010-08-28 ENCOUNTER — Other Ambulatory Visit: Payer: Self-pay | Admitting: Nephrology

## 2010-08-28 ENCOUNTER — Encounter (HOSPITAL_COMMUNITY): Payer: Medicare Other | Attending: Nephrology

## 2010-08-28 ENCOUNTER — Other Ambulatory Visit: Payer: Self-pay | Admitting: *Deleted

## 2010-08-28 DIAGNOSIS — N184 Chronic kidney disease, stage 4 (severe): Secondary | ICD-10-CM | POA: Insufficient documentation

## 2010-08-28 DIAGNOSIS — D638 Anemia in other chronic diseases classified elsewhere: Secondary | ICD-10-CM | POA: Insufficient documentation

## 2010-08-28 LAB — IRON AND TIBC
Iron: 63 ug/dL (ref 42–135)
Saturation Ratios: 29 % (ref 20–55)
TIBC: 219 ug/dL — ABNORMAL LOW (ref 250–470)

## 2010-08-28 MED ORDER — ALLOPURINOL 300 MG PO TABS: 300.0000 mg | ORAL_TABLET | Freq: Every day | ORAL | Status: DC

## 2010-08-28 NOTE — Telephone Encounter (Signed)
R'cd fax from [facility] for refill of pt's Allopurinol

## 2010-09-02 NOTE — Assessment & Plan Note (Signed)
[facility] OFFICE NOTE   TRENT, CLEARE                   MRN:          [id number]  DATE:03/09/2007                            DOB:          1932-06-29    Ms. Deger returns today for further management of the following  issues.  1. Coronary artery disease.  She is totally asymptomatic.  We did a      pre-op clearance February 25, 2007.  Stress Myoview in October of      2007.  This showed an EF 56% with inferior wall hypokinesis      suggestive of ischemia with a perfusion scan.  She is diabetic and      most likely has some coronary artery disease.  She is at low risk      with her scan stratification.  2. Type 2 diabetes.  I notice her hemoglobin A1c was less than 6% on      last check with Dr. Loanne Drilling.  I applauded her efforts.  3. Chronic renal insufficiency with creatinine of 2.2 to 2.5.  This is      one of the reasons we avoided cardiac catheterization in the past.  4. Hypertension.  5. Hyperlipidemia.  She is currently on simvastatin to save money.      Her total cholesterol was 145, HDL 37.4, LDL of 85, triglycerides      115 with normal LFTs in October 2008.   She offers really no complaints today.  She was able to get through her  left total knee without any major problems.  She is probably going to  have to have a right total knee in the future.   Her medications are unchanged since we last saw her, except she is now  on omeprazole 20 mg a day and she is finishing up a course of cefuroxime  antibiotic.   Her blood pressure today is 141/70, pulse 70 and regular, weight is 294,  which is actually up 18 pounds.  HEENT:  Normocephalic, atraumatic.  PERRLA.  Extraocular muscles are  intact.  Sclerae clear.  Facial symmetry is normal.  Carotid upstrokes  are equal bilaterally without bruits.  No JVD.  Thyroid is not enlarged.  Lungs are clear.  HEART:  Reveals a poorly-appreciated PMI.   She has normal S1, S2.  ABDOMEN:  Soft with good bowel sounds.  EXTREMITIES:  1+ edema.  Pulses are intact.  NEURO:  Grossly intact.   An EKG shows sinus rhythm with nonspecific ST segment changes.  She has  an occasional PVC.   ASSESSMENT AND PLAN:  Ms. Jackovich is stable from my standpoint.  She  is doing a very good job with risk factor modification, particularly  control of her diabetes.   I will plan on seeing her back in a year.  If she decides to have a  right total knee, she will need preoperative clearance once again.     Thomas C. Verl Blalock, MD, [facility]  Electronically Signed    TCW/MedQ  DD: 03/15/2007  DT: 03/15/2007  Job #: [id number]

## 2010-09-05 NOTE — Assessment & Plan Note (Signed)
[facility] OFFICE NOTE   Stacy, Walls                   MRN:          [id number]  DATE:02/24/2006                            DOB:          1932/12/03    I was asked by Dr. Eddie Dibbles to do a preoperative assessment as well as Dr.  Loanne Drilling on Stacy Walls.   She is scheduled for a left total knee next Wednesday, November 13th.   HISTORY OF PRESENT ILLNESS:  She is 77 years of age and has had diabetes for  about 15 years as well as hypertension. She suffers from being overweight.   She was complaining of some tinkling, pain off and on in the left side of  her upper chest. It would last a few minutes. There was no pressure,  heaviness, shortness of breath or diaphoresis. It seems to get better with  some antacids.   She underwent a stress Myoview on February 02, 2006. This demonstrates an EF  of 56% with inferior wall hypokinesia suggestive of ischemia with her  perfusion scan.   PAST MEDICAL HISTORY:   INTOLERANCES:  SHE IS INTOLERANT OF SULFA, AZITHROMYCIN, HYDROCODONE,  TRAMADOL AND DARVOCET. She has no history of dye reaction.   She does not smoke, drink or use any recreational drugs. She is very limited  in ambulation, mostly using a walker. She also needs a total right knee.   She has also chronic renal insufficiency and is followed by Dr. Justin Mend. Her  creatinine is 2.5 on last check February 04, 2006.   SURGICAL HISTORY:  1. In 1977, had a C-section.  2. In 1988, ankle surgery from a torn ligament.   FAMILY HISTORY:  Her father died of a heart attack at age 48.   SOCIAL HISTORY:  She is retired in 1989. She is married. Her husband is with  her today.   REVIEW OF SYSTEMS:  She has no constitutional symptoms. She has no history  of any allergies or hay fever. She does have some constipation, but no  melena, hematochezia. She does have reflux symptoms. MUSCULOSKELETAL: Is due  to  chronic arthritic changes as mentioned in the HPI. She does not suffer  from any psychological disorders such as anxiety or depression.   Rest of review of systems is negative.   PHYSICAL EXAMINATION:  She is very pleasant. She is sitting in a wheelchair.  Her blood pressure is 138/75. Her pulse is 60 and regular. Her weight is  276. Most recent ECG showed sinus rhythm with no ST segment changes.  HEENT: Normocephalic, atraumatic. PERRLA. Extra-ocular movements intact.  Sclera clear. Facial symmetry is normal. Carotid upstrokes are full without  bruits. There is no JVD. Thyroid is not enlarged.  LUNGS:  Reveal clear breath sounds.  HEART: Reveals a poorly appreciated PMI. She has normal S1, S2.  ABDOMEN: Is protuberant with good bowel sounds. Organomegaly cannot be  assessed.  EXTREMITIES: Reveal 1+ edema. Pulses were present, bilaterally symmetrical.  NEURO: Is Intact.   ASSESSMENT/PLAN:  1. Coronary artery disease with very small area of inferior lateral wall  ischemia with normal left ventricular systolic function. Her tinkling      symptoms are not related to her heart. Unfortunately, like most      diabetics, she is probably not having any symptoms of coronary      ischemia.  2. Chronic renal insufficiency with creatinine of 2.5.  3. Hypertension.  4. Diabetes.  5. Morbid obesity.   I have had a long talk with her and her husband (greater than 20 minutes) of  potential risks, benefits and expectations of the surgery have been  reviewed. I put her in a low-moderate risk category as far as having a  stroke, myocardial infarction, or cardiovascular death from this surgery. I  certainly do not think this is prohibitive. I am very reluctant to cath her  because of her creatinine of 2.5.   I would advise Dr. Eddie Dibbles to admit her to the hospital. Please consult [place] to  follow along with her postoperative care. I would make no changes in her  medical program at this point. I  thought about a beta-blocker, but her heart  rate is 60 today on no beta-blocker. She may need this peri-operatively.     Thomas C. Verl Blalock, MD, [facility]  Electronically Signed    TCW/MedQ  DD: 02/24/2006  DT: 02/24/2006  Job #: [id number]   cc:   Hilliard Clark A. Loanne Drilling, MD  V. Hiram Comber, M.D.

## 2010-09-05 NOTE — H&P (Signed)
NAME:  Stacy Walls, GEHLING            ACCOUNT NO.:  [id number]   MEDICAL RECORD NO.:  [id number]         PATIENT TYPE:  LINP   LOCATION:                               FACILITY:  [facility]   PHYSICIAN:  Lauretta Grill, M.D.    DATE OF BIRTH:  1932-06-19   DATE OF ADMISSION:  03/02/2006  DATE OF DISCHARGE:                              HISTORY & PHYSICAL   CHIEF COMPLAINT:  Left knee pain.   HISTORY OF PRESENT ILLNESS:  The patient is a 77 year old female with a  history of left knee pain.  She has previously undergone conservative  treatment including injections and has failed to the point.  Radiographs  in the office revealed bone-on-bone contact in the left knee and end-  stage osteoarthritis.  Upon these findings and failure of conservative  treatment, Dr. Eddie Dibbles thought that it was best to proceed with a left  total knee arthroplasty.  The patient agreed.  The risks and benefits of  the surgery were discussed with the patient and the wishes to proceed.   DRUG ALLERGIES:  SULFA.   MEDICATIONS:  See medication record.   PAST MEDICAL HISTORY:  1. Hypertension.  2. Coronary artery disease.  3. Hypercholesterolemia.  4. Diabetes mellitus.   PAST SURGICAL HISTORY:  Ankle surgery.   SOCIAL HISTORY:  The patient denies any tobacco or alcohol use.  She is  married.  She lives in a [place] house with no steps.   FAMILY HISTORY:  Positive for diabetes and hypertension.   REVIEW OF SYSTEMS:  Unremarkable.   PHYSICAL EXAMINATION:  VITAL SIGNS:  Temperature 98.6, pulse 68,  respirations 20, blood pressure 120/80.  GENERAL:  Well-developed, well-nourished 77 year old female who is  awake, alert, oriented, and cooperative on exam today.  HEENT:  Normocephalic, atraumatic.  Pupils equal, round, and reactive to  light.  NECK:  Supple, no carotid bruit noted.  CHEST:  Clear to auscultation bilaterally.  No wheezes or crackles.  HEART:  Regular rate and rhythm.  No murmurs, rubs, or gallops.  ABDOMEN:  Soft, nontender, nondistended, positive bowel sounds x4.  EXTREMITIES:  Examination of the left knee reveals range of motion from  0-90 degrees with 2+ patellofemoral crepitation and she is  neurovascularly intact distally in the left lower extremity.  SKIN:  No rashes or lesions.   Radiograph reveals bone-on-bone contact in the left knee.   IMPRESSION:  End-stage osteoarthritis of the left knee.   PLAN:  The patient will be admitted to [facility] and undergo  a left total knee arthroplasty by Dr. Hiram Comber.     ______________________________  Pedro Earls, P.A.-C.    ______________________________  V. Hiram Comber, M.D.    SW/MEDQ  D:  04/02/2006  T:  04/02/2006  Job:  [id number]

## 2010-09-05 NOTE — Discharge Summary (Signed)
NAME:  Stacy Walls, Stacy Walls            ACCOUNT NO.:  [id number]   MEDICAL RECORD NO.:  [id number]          PATIENT TYPE:  INP   LOCATION:  1611                         FACILITY:  [facility]   PHYSICIAN:  Lauretta Grill, M.D.    DATE OF BIRTH:  1932-05-13   DATE OF ADMISSION:  03/02/2006  DATE OF DISCHARGE:                                 DISCHARGE SUMMARY   ADMISSION DIAGNOSES:  1. End-stage osteoarthritis of the left knee.  2. Diabetes mellitus.  3. Hypertension.  4. Renal insufficiency.   DISCHARGE DIAGNOSES:  1. End-stage osteoarthritis of the left knee, status post left total knee      arthroplasty.  2. Diabetes mellitus.  3. Hypertension.  4. Renal insufficiency.  5. Postoperative hemorrhagic anemia, stable at the time of discharge.   PROCEDURE:  The patient was taken to the operating room on March 02, 2006  and underwent a left total knee arthroplasty, DePuy LCS rotating platform  with a MBT revision stem. Surgeon was Dr. Hiram Comber. Assistant Pedro Earls, P.A.-C. Surgery was done under general anesthesia with a femoral  nerve block. Hemovac drain x1 was placed at the time of surgery.   CONSULTS:  Physical therapy, occupational therapy, social work, case  management, physical medicine rehabilitation with Dr. Theda Sers.   BRIEF HISTORY:  The patient is a 77 year old female who has failed  conservative treatment up to this point. Radiographs in the office revealed  end-stage osteoarthritis of the left knee. Upon failure of conservation  treatment and the x-ray findings, Dr. Eddie Dibbles felt it was best to proceed with  a left total knee arthroplasty. The patient agreed. The risks and benefits  of the surgery were discussed with the patient, and the patient wished to  proceed.   LABORATORY DATA:  CBC on admission showed a hemoglobin of 12.1, hematocrit  of 35.9, white blood cell count 3.4 which was low, red blood cell of 3.91.  Serial H&Hs were performed throughout the hospital  stay. Hemoglobin and  hematocrit did decline to 8.9 and 26.4, respectively, though stable at the  time of discharge. Differential on admission showed neutrophils low at 46,  monocytes high at 14. Coagulation studies on admission were all within  normal limits. PT/INR at time of this dictation were 30.9 and 2.8,  respectively, on Coumadin therapy. Routine chemistry on admission showed BUN  high at 46, creatinine high at 2.3. Serial chemistries were followed  throughout hospital stay. CO2 did rise to 34 on November 14 but returned to  the normal range. Glucose ranged from a low of 89 at the time of admission  to a high of 153. BUN rose to a high of 51; at the time of this dictation  had fallen to 37. Creatinine rose to a high of 2.6 but fell to 2.1 at the  time of this dictation as well. Chloride fell to 93, and sodium fell to 130  but was stable at the time of this dictation. Urinalysis on admission was  all within normal limits. The patient's blood type is O positive with  antibody screen negative.   HOSPITAL COURSE:  The patient was admitted to [facility] and taken  to the operating room. She underwent the above-stated procedure without  complication. The patient tolerated the procedure well and allowed to the  recovery room and orthopedic floor for continued postoperative care.  Postoperative day #1, the patient was resting comfortably. T-max was 101.1.  Hemoglobin and hematocrit were 10.6 and 31.1. She was neurovascularly intact  in the left lower extremity. Dressing was clean, dry and intact. The patient  was working with physical therapy and occupational therapy. Rehabilitation  consult was pending at this time. November 15, postoperative day #2, the  patient had T-max of 101.5. Hemoglobin and hematocrit were 9.9 and 29.0. She  was neurovascularly intact in the left lower extremity. Incision was clean,  dry and intact. She was to continue working with physical therapy and   occupational therapy with most likely discharge to a skilled nursing  facility. PCA was discontinued on this day as well as the IV Hep-Lock.  Dressing was changed. The Hemovac was inadvertently pulled out on  postoperative day #1. November 16, postoperative day #3, the patient was  resting comfortable. T-max remained 101.5. Hemoglobin and hematocrit of 8.9  and 26.4. The patient wished to not have any blood products. She was  neurovascularly intact in the left lower extremity. Incision was clean, dry,  and intact with some slight serous drainage. She was to continue working  with physical therapy and occupational therapy for possible skilled nursing  facility placement today.   DISPOSITION:  The patient is to be discharged to a skilled nursing facility  of her choice.   DISCHARGE MEDICATIONS:  1. Coumadin per pharmacy protocol.  2. Colace 100 mg p.o. b.i.d.  3. Ferrous 325 mg p.o. t.i.d.  4. Sliding scale insulin.  5. Avapro 300 mg p.o. daily.  6. Hydrochlorothiazide 25 mg p.o. daily.  7. Actos 15 mg p.o. daily a.c.  8. Vytorin 10/80 mg 1 p.o. every night.  9. Calcitriol 0.5 mg p.o. daily.  10.Ditropan 2.5 mg p.o. b.i.d.  11.Lasix 20 mg p.o. daily.  12.Procrit 10,000 units subcu every 14 days.  13.Laxative of choice 1 unit p.o. p.r.n.  14.Anemia of choice 1 unit p.r.n.  15.Reglan 10 mg p.o. q.8h. p.r.n.  16.Phenergan 25 mg p.o. q.6-8h. p.r.n.  17.Robaxin 500 mg p.o. q.6-8h. p.r.n.  18.Restoril 15 to 30 mg p.o. every night p.r.n.  19.Benadryl 25 mg p.o. every night p.r.n.  20.Mepergan Fortis, which is a combination of Demerol and Phenergan, 1 to      2 p.o. q.4-6h. p.r.n. pain.   ACTIVITY:  The patient is weight bearing as tolerated to the left lower  extremity. Knee immobilizers to be worn when she is up walking until she can  do 10 straight leg raises. CPM machine needs to be on at least 8 hours a  day, increasing her motion to 5 degrees daily until she is up to 90 to  95 degrees.   WOUND CARE:  The patient is to have daily dressing changes performed until  no drainage. She may shower when no drainage.   FOLLOW UP:  The patient is to follow up with Dr. Eddie Dibbles 2 weeks from the date  of surgery. The office was to be called for an appointment at [phone].   CONDITION ON DISCHARGE:  Stable and improved.     ______________________________  Pedro Earls, P.A.-C.    ______________________________  V. Hiram Comber, M.D.    SW/MEDQ  D:  03/05/2006  T:  03/05/2006  Job:  [id number]

## 2010-09-05 NOTE — Op Note (Signed)
NAME:  Stacy Walls, Stacy Walls            ACCOUNT NO.:  [id number]   MEDICAL RECORD NO.:  [id number]          PATIENT TYPE:  INP   LOCATION:  NA                           FACILITY:  [facility]   PHYSICIAN:  Lauretta Grill, M.D.    DATE OF BIRTH:  1933-03-02   DATE OF PROCEDURE:  03/02/2006  DATE OF DISCHARGE:                                 OPERATIVE REPORT   PREOPERATIVE DIAGNOSES:  1. End-stage degenerative joint disease, left knee.  2. High body mass index with varus.   POSTOPERATIVE DIAGNOSES:  1. End-stage degenerative joint disease, left knee.  2. High body mass index with varus.   PROCEDURE:  Left total knee arthroplasty using cemented DePuy components LCS  type with rotating platform and MVT revision type stem.   SURGEON:  Maia Breslow, MD   ASSISTANT:  Pedro Earls, [facility]   ANESTHESIA:  General with endotracheal.   CULTURES:  None.   DRAINS:  Two medium Hemovacs to Autovac.   ESTIMATED BLOOD LOSS:  Less than 100 mL replaced without.   TOURNIQUET TIME:  1 hour 29 minutes.   PATHOLOGIC FINDINGS AND HISTORY:  Mahaley is a 77 year old female with  bilateral varus knees and high body mass index, who presented with end-stage  degenerative joint disease.  Due to her increasing pain, bone-on-bone, it  was elected to proceed with total knee arthroplasty.  At surgery, she had  tricompartmental disease.  We achieved a satisfactory slight valgus  alignment using an intramedullary stem, 14 x 75 mm on an MVT revision tray,  a size 3 tibia, a standard left femur, and a standard 12.5-mm insert with a  32 mm patella.  A lateral retinacular release was carried out, and full  range of motion was obtained with good stability.  The knee in full  extension was stable and went to flexion about 95 degrees.   PROCEDURE:  With adequate anesthesia obtained using endotracheal technique,  1 g vancomycin given IV prophylaxis, the patient was placed in the supine  position.  The left lower extremity was  prepped from the toes to the  tourniquet in the standard fashion.  After standard prepping and draping,  Esmarch exsanguination was used.  The tourniquet was let up to 400 mmHg.  A  medium parapatellar skin incision was followed by a median parapatellar  retinacular incision.  The incision was deepened sharply with a knife and  hemostasis obtained using the Bovie electrocoagulator.  We then everted the  patella and excised the fat pad both cruciates and the menisci.  Osteophytes  were also removed which were plentiful.  I then amputated the tibial spine.  Then starting slightly laterally, I placed intermedullary drill in place and  reamed up to a 14, then placed the tibial cutting jig in place and made my  first cut.  The femur was then sized to a standard.  I then placed the  intermedullary guide for the anterior-posterior cutting jig, placed it in  place, set it with a C-clamp on, and it was a bit tight so I cut 5 more mm  of tibia, set the C-clamp,  and set the rotation, pinned it, made the  anterior-posterior cuts.  I then fit a 12.5 in flexion.  I then placed a 4  degree distal valgus cutting jig in place and set it on one more than the  standard, made the cut, and fit the 12.5 in extension.  I then placed a  finishing guide on the distal femur, made those cuts.  I then exposed the  proximal tibia, sized to a 3, and then did the tower for drilling the  proximal stem and further distal for the 14 x 75 stem.  I then placed the  trial 14 x 75 size 3 tibia with keel in place.  I placed the trial standard  12.5 insert, placed a standard left femur and articulated the knee through a  full range of motion.  We then callipered the patella and cut it down to a  15, replacing 8 for 32 mm patella.  We then placed the template for the 3  holes for the patella, made those holes, and placed a trial patella in  place.  A lateral retinacular release was carried out.  I then removed all  trial  components while the knee was thoroughly jet lavaged.  I then checked  all components for sizing as they came on the field and assembled the tibial  component to its stem.  We then mixed tobramycin 1.4 grams x2 for 2 batches  of cement, and we then cemented on the tibial component, impacted it, and  removed excess cement.  Cement taken down to just above the flutes on the  stem.  We then placed the 12.5 mm insert, cemented on the femoral component,  impacted it, removed excess cement, and held it in full extension and then  removed excess cement and then 45 degrees of flexion while the cement cured.  We then cemented on the patellar component, impacted it, removed excess  cement.  When this additional jet lavage had been carried out and the cement  had cured, the tourniquet was let down and bleeding points cauterized.  Hemovac drains were placed in the medial and lateral gutter and brought out  the superior lateral portal.  The wound was then closed in layers with #1  figure-of-eight mattress sutures of Vicryl with an oversew of #1 running  locking PDS.  Then 2-0 Vicryl was used on the subcu and skin staples placed.  Hemovac was hooked to Autovac, bulky sterile compressive dressing was  applied with knee immobilizer and the patient having tolerated the procedure  well, was awakened, taken to recovery room in satisfactory condition for  routine postoperative care and CPM.           ______________________________  V. Hiram Comber, M.D.     VEP/MEDQ  D:  03/02/2006  T:  03/02/2006  Job:  [id number]   cc:   Hilliard Clark A. Loanne Drilling, [place] [address]  [place]  [place] [address]   Gentry Fitz, M.D.  Fax: [phone]

## 2010-09-10 ENCOUNTER — Other Ambulatory Visit: Payer: Self-pay | Admitting: *Deleted

## 2010-09-10 MED ORDER — FUROSEMIDE 20 MG PO TABS: ORAL_TABLET | ORAL | Status: DC

## 2010-09-10 NOTE — Telephone Encounter (Signed)
Per Phineas Real, pt states she is out of Furosemide and was told by [facility] that she would be unable to receive rx until July. Pt would like a temporary supply to go to [facility]. Pt is completely out.

## 2010-09-25 ENCOUNTER — Encounter (HOSPITAL_COMMUNITY): Payer: Medicare Other

## 2010-09-29 ENCOUNTER — Other Ambulatory Visit: Payer: Self-pay | Admitting: Nephrology

## 2010-09-29 ENCOUNTER — Encounter (HOSPITAL_COMMUNITY): Payer: Medicare Other | Attending: Nephrology

## 2010-09-29 DIAGNOSIS — N184 Chronic kidney disease, stage 4 (severe): Secondary | ICD-10-CM | POA: Insufficient documentation

## 2010-09-29 DIAGNOSIS — D638 Anemia in other chronic diseases classified elsewhere: Secondary | ICD-10-CM | POA: Insufficient documentation

## 2010-09-29 LAB — IRON AND TIBC: UIBC: 145 ug/dL

## 2010-09-29 LAB — FERRITIN: Ferritin: 572 ng/mL — ABNORMAL HIGH (ref 10–291)

## 2010-09-29 LAB — POCT HEMOGLOBIN-HEMACUE: Hemoglobin: 11.4 g/dL — ABNORMAL LOW (ref 12.0–15.0)

## 2010-10-28 ENCOUNTER — Encounter: Payer: Self-pay | Admitting: Cardiology

## 2010-10-30 ENCOUNTER — Other Ambulatory Visit: Payer: Self-pay | Admitting: Nephrology

## 2010-10-30 ENCOUNTER — Encounter (HOSPITAL_COMMUNITY): Payer: Medicare Other | Attending: Nephrology

## 2010-10-30 DIAGNOSIS — D638 Anemia in other chronic diseases classified elsewhere: Secondary | ICD-10-CM | POA: Insufficient documentation

## 2010-10-30 DIAGNOSIS — N184 Chronic kidney disease, stage 4 (severe): Secondary | ICD-10-CM | POA: Insufficient documentation

## 2010-10-30 LAB — IRON AND TIBC
Saturation Ratios: 36 % (ref 20–55)
TIBC: 234 ug/dL — ABNORMAL LOW (ref 250–470)

## 2010-10-31 LAB — POCT HEMOGLOBIN-HEMACUE: Hemoglobin: 11.7 g/dL — ABNORMAL LOW (ref 12.0–15.0)

## 2010-11-10 ENCOUNTER — Encounter: Payer: Self-pay | Admitting: *Deleted

## 2010-11-10 ENCOUNTER — Other Ambulatory Visit: Payer: Self-pay | Admitting: Endocrinology

## 2010-11-10 ENCOUNTER — Telehealth: Payer: Self-pay | Admitting: Endocrinology

## 2010-11-10 ENCOUNTER — Other Ambulatory Visit (INDEPENDENT_AMBULATORY_CARE_PROVIDER_SITE_OTHER): Payer: Medicare Other

## 2010-11-10 ENCOUNTER — Ambulatory Visit (INDEPENDENT_AMBULATORY_CARE_PROVIDER_SITE_OTHER): Payer: Medicare Other | Admitting: Endocrinology

## 2010-11-10 VITALS — BP 124/72 | HR 73 | Temp 99.2°F | Ht 62.5 in | Wt 270.0 lb

## 2010-11-10 DIAGNOSIS — E119 Type 2 diabetes mellitus without complications: Secondary | ICD-10-CM

## 2010-11-10 DIAGNOSIS — E785 Hyperlipidemia, unspecified: Secondary | ICD-10-CM

## 2010-11-10 LAB — LIPID PANEL
Cholesterol: 189 mg/dL (ref 0–200)
HDL: 51 mg/dL (ref >39.00)
Triglycerides: 223 mg/dL — ABNORMAL HIGH (ref 0.0–149.0)

## 2010-11-10 LAB — HEMOGLOBIN A1C: Hgb A1c MFr Bld: 7.4 % — ABNORMAL HIGH (ref 4.6–6.5)

## 2010-11-10 MED ORDER — ATORVASTATIN CALCIUM 80 MG PO TABS: 80.0000 mg | ORAL_TABLET | Freq: Every day | ORAL | Status: DC

## 2010-11-10 MED ORDER — FUROSEMIDE 20 MG PO TABS: ORAL_TABLET | ORAL | Status: DC

## 2010-11-10 MED ORDER — LOSARTAN POTASSIUM-HCTZ 100-25 MG PO TABS: 1.0000 | ORAL_TABLET | Freq: Every day | ORAL | Status: DC

## 2010-11-10 MED ORDER — DOXYCYCLINE HYCLATE 100 MG PO TABS: 100.0000 mg | ORAL_TABLET | Freq: Two times a day (BID) | ORAL | Status: AC

## 2010-11-10 MED ORDER — SIMVASTATIN 80 MG PO TABS: 80.0000 mg | ORAL_TABLET | Freq: Every day | ORAL | Status: DC

## 2010-11-10 MED ORDER — BROMOCRIPTINE MESYLATE 2.5 MG PO TABS: 1.2500 mg | ORAL_TABLET | Freq: Every day | ORAL | Status: DC

## 2010-11-10 MED ORDER — OMEPRAZOLE 20 MG PO CPDR: 20.0000 mg | DELAYED_RELEASE_CAPSULE | Freq: Every day | ORAL | Status: DC

## 2010-11-10 NOTE — Patient Instructions (Addendum)
i have sent a prescription to your pharmacy, for an antibiotic.  I hope you feel better soon.  If you don't feel better by next week, please call back. Please schedule a "medicare wellness" appointment. blood tests are being ordered for you today.  please call [phone] to hear your test results.  You will be prompted to enter the 9-digit "MRN" number that appears at the top left of this page, followed by #.  Then you will hear the message.

## 2010-11-10 NOTE — Telephone Encounter (Signed)
i left message on phone tree Add parlodel 1/2 of 2.5 mg qhs Change zocor to lipitor 80 qhs

## 2010-11-10 NOTE — Progress Notes (Signed)
Subjective:    Patient ID: Stacy Walls, female    DOB: 01-05-1933, 77 y.o.   MRN: [id number]  HPI Pt states few few weeks of pain at the left ear, but no assoc congestion.   no cbg record, but states cbg's are mid-100's. She says she now takes the zocor Past Medical History  Diagnosis Date  . Chest pain   . Clostridium difficile colitis   . Coronary atherosclerosis of native coronary vessel   . Chronic renal insufficiency   . HLD (hyperlipidemia)   . DM2 (diabetes mellitus, type 2)   . HTN (hypertension)   . Acute cholecystitis   . Dyshidrosis   . Routine general medical examination at a health care facility   . Secondary hyperparathyroidism (of renal origin)   . Hypothyroidism   . Dizziness   . Cough   . Neck pain   . Neck mass   . GERD (gastroesophageal reflux disease)   . Urinary tract infection   . Urinary incontinence   . Gout   . Osteoporosis   . Allergic rhinitis   . Anemia   . Diabetes mellitus     Past Surgical History  Procedure Date  . C-section (other) 1977  . L ankle surgery 1988  . Electrocardiogram 02/01/06  . Bone density 08/21/05  . Cholecystectomy   . Total left knee 2007    History   Social History  . Marital Status: Married    Spouse Name: N/A    Number of Children: N/A  . Years of Education: N/A   Occupational History  . Not on file.   Social History Main Topics  . Smoking status: Never Smoker   . Smokeless tobacco: Not on file  . Alcohol Use: No  . Drug Use: No  . Sexually Active:    Other Topics Concern  . Not on file   Social History Narrative   Lives with husband and has lived in [place] for over 30 years.     Current Outpatient Prescriptions on File Prior to Visit  Medication Sig Dispense Refill  . allopurinol (ZYLOPRIM) 300 MG tablet Take 1 tablet (300 mg total) by mouth daily.  90 tablet  1  . aspirin (ECOTRIN LOW STRENGTH) 81 MG EC tablet Take 81 mg by mouth daily.        Marland Kitchen epoetin alfa (PROCRIT) 28413  UNIT/ML injection Inject 10,000 Units into the skin every 30 (thirty) days. Dr. Justin Mend      . Ferrous Sulfate (IRON) 325 (65 FE) MG TABS Take 1 tablet by mouth daily.       . furosemide (LASIX) 20 MG tablet 1 tablet by mouth once daily  30 tablet  2  . losartan-hydrochlorothiazide (HYZAAR) 100-25 MG per tablet Take 1 tablet by mouth daily.        Marland Kitchen oxybutynin (DITROPAN) 5 MG tablet Take 1 tablet (5 mg total) by mouth daily.  90 tablet  1  . simvastatin (ZOCOR) 80 MG tablet Take 80 mg by mouth at bedtime.        . sitaGLIPtin (JANUVIA) 100 MG tablet 1/2 tablet by mouth daily      . triamcinolone (KENALOG) 0.1 % ointment Apply topically 2 (two) times daily as needed.          Allergies  Allergen Reactions  . Azithromycin   . Pioglitazone     REACTION: edema  . Sulfonamide Derivatives     REACTION: rash  . Tramadol     Family  History  Problem Relation Age of Onset  . Coronary artery disease Father   . Stroke Father   . Diabetes Father   . Diabetes Sister   . Stroke Sister   . Diabetes Brother     BP 124/72  Pulse 73  Temp(Src) 99.2 F (37.3 C) (Oral)  Ht 5' 2.5" (1.588 m)  Wt 270 lb (122.471 kg)  BMI 48.60 kg/m2  SpO2 95%    Review of Systems Denies fever and hearing loss.      Objective:   Physical Exam GENERAL: no distress Both eac's and tm's are normal Neck: multinodular goiter is unchanged     Assessment & Plan:  Ear pain, uncertain etiology.  New Dm, uncertain control Dyslipidemia, now on rx.

## 2010-11-17 ENCOUNTER — Telehealth: Payer: Self-pay

## 2010-11-17 NOTE — Telephone Encounter (Signed)
Please try going off altogether

## 2010-11-17 NOTE — Telephone Encounter (Signed)
Pt advised.

## 2010-11-17 NOTE — Telephone Encounter (Signed)
Pt called stating that Doxy is causing vaginal irritation, nausea and some diarrhea. Pt says she has stopped medication and feels better, does she need an alternative ABX or okay to stop all together?

## 2010-11-27 ENCOUNTER — Other Ambulatory Visit: Payer: Self-pay | Admitting: Nephrology

## 2010-11-27 ENCOUNTER — Encounter (HOSPITAL_COMMUNITY)
Admission: RE | Admit: 2010-11-27 | Discharge: 2010-11-27 | Disposition: A | Discharge status: Home / Self Care | Payer: Medicare Other | Source: Ambulatory Visit | Attending: Nephrology | Admitting: Nephrology

## 2010-11-27 DIAGNOSIS — D638 Anemia in other chronic diseases classified elsewhere: Secondary | ICD-10-CM | POA: Insufficient documentation

## 2010-11-27 DIAGNOSIS — N184 Chronic kidney disease, stage 4 (severe): Secondary | ICD-10-CM | POA: Insufficient documentation

## 2010-11-27 LAB — IRON AND TIBC
Saturation Ratios: 31 % (ref 20–55)
UIBC: 161 ug/dL

## 2010-11-27 LAB — POCT HEMOGLOBIN-HEMACUE: Hemoglobin: 11.4 g/dL — ABNORMAL LOW (ref 12.0–15.0)

## 2010-12-04 ENCOUNTER — Ambulatory Visit: Payer: Medicare Other | Admitting: Cardiology

## 2010-12-06 ENCOUNTER — Other Ambulatory Visit: Payer: Self-pay | Admitting: Endocrinology

## 2010-12-17 ENCOUNTER — Encounter: Payer: Medicare Other | Admitting: Endocrinology

## 2010-12-25 ENCOUNTER — Other Ambulatory Visit: Payer: Self-pay | Admitting: Nephrology

## 2010-12-25 ENCOUNTER — Encounter (HOSPITAL_COMMUNITY): Payer: Medicare Other | Attending: Nephrology

## 2010-12-25 DIAGNOSIS — D638 Anemia in other chronic diseases classified elsewhere: Secondary | ICD-10-CM | POA: Insufficient documentation

## 2010-12-25 DIAGNOSIS — N184 Chronic kidney disease, stage 4 (severe): Secondary | ICD-10-CM | POA: Insufficient documentation

## 2010-12-25 LAB — POCT HEMOGLOBIN-HEMACUE: Hemoglobin: 11.7 g/dL — ABNORMAL LOW (ref 12.0–15.0)

## 2011-01-08 ENCOUNTER — Ambulatory Visit: Payer: Self-pay | Admitting: Cardiology

## 2011-01-08 LAB — IRON AND TIBC
Saturation Ratios: 37
TIBC: 259
UIBC: 162

## 2011-01-08 LAB — HEMOGLOBIN AND HEMATOCRIT, BLOOD: HCT: 33.2 — ABNORMAL LOW

## 2011-01-09 LAB — IRON AND TIBC: UIBC: 181

## 2011-01-10 IMAGING — CR DG CHEST 2V
2 series · 2 of 2 positions shown · non-contrast
Comparison: Chest x-ray of 08/08/2009

CLINICAL DATA: Chest pain, abdominal pain, vomiting

CHEST - 2 VIEW

[w chest pa]
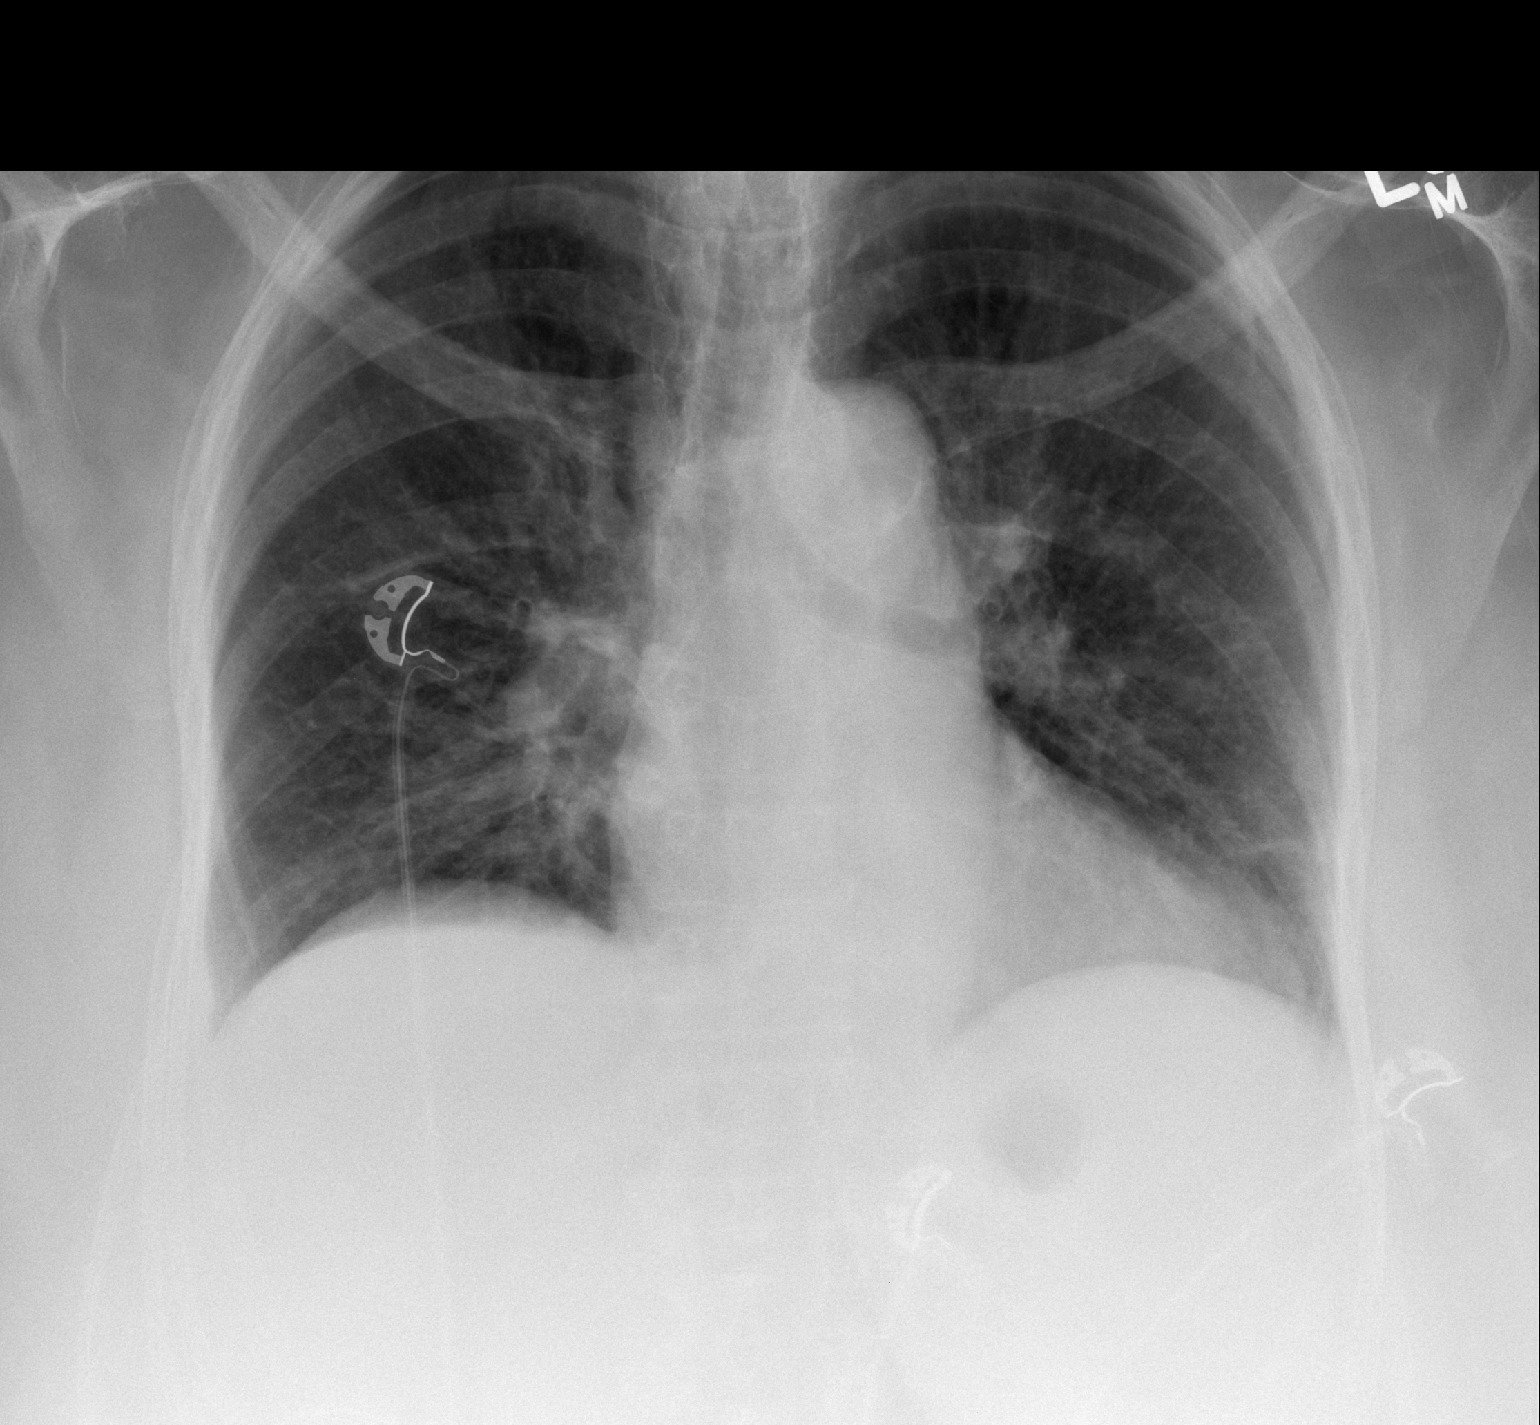

[w chest lat]
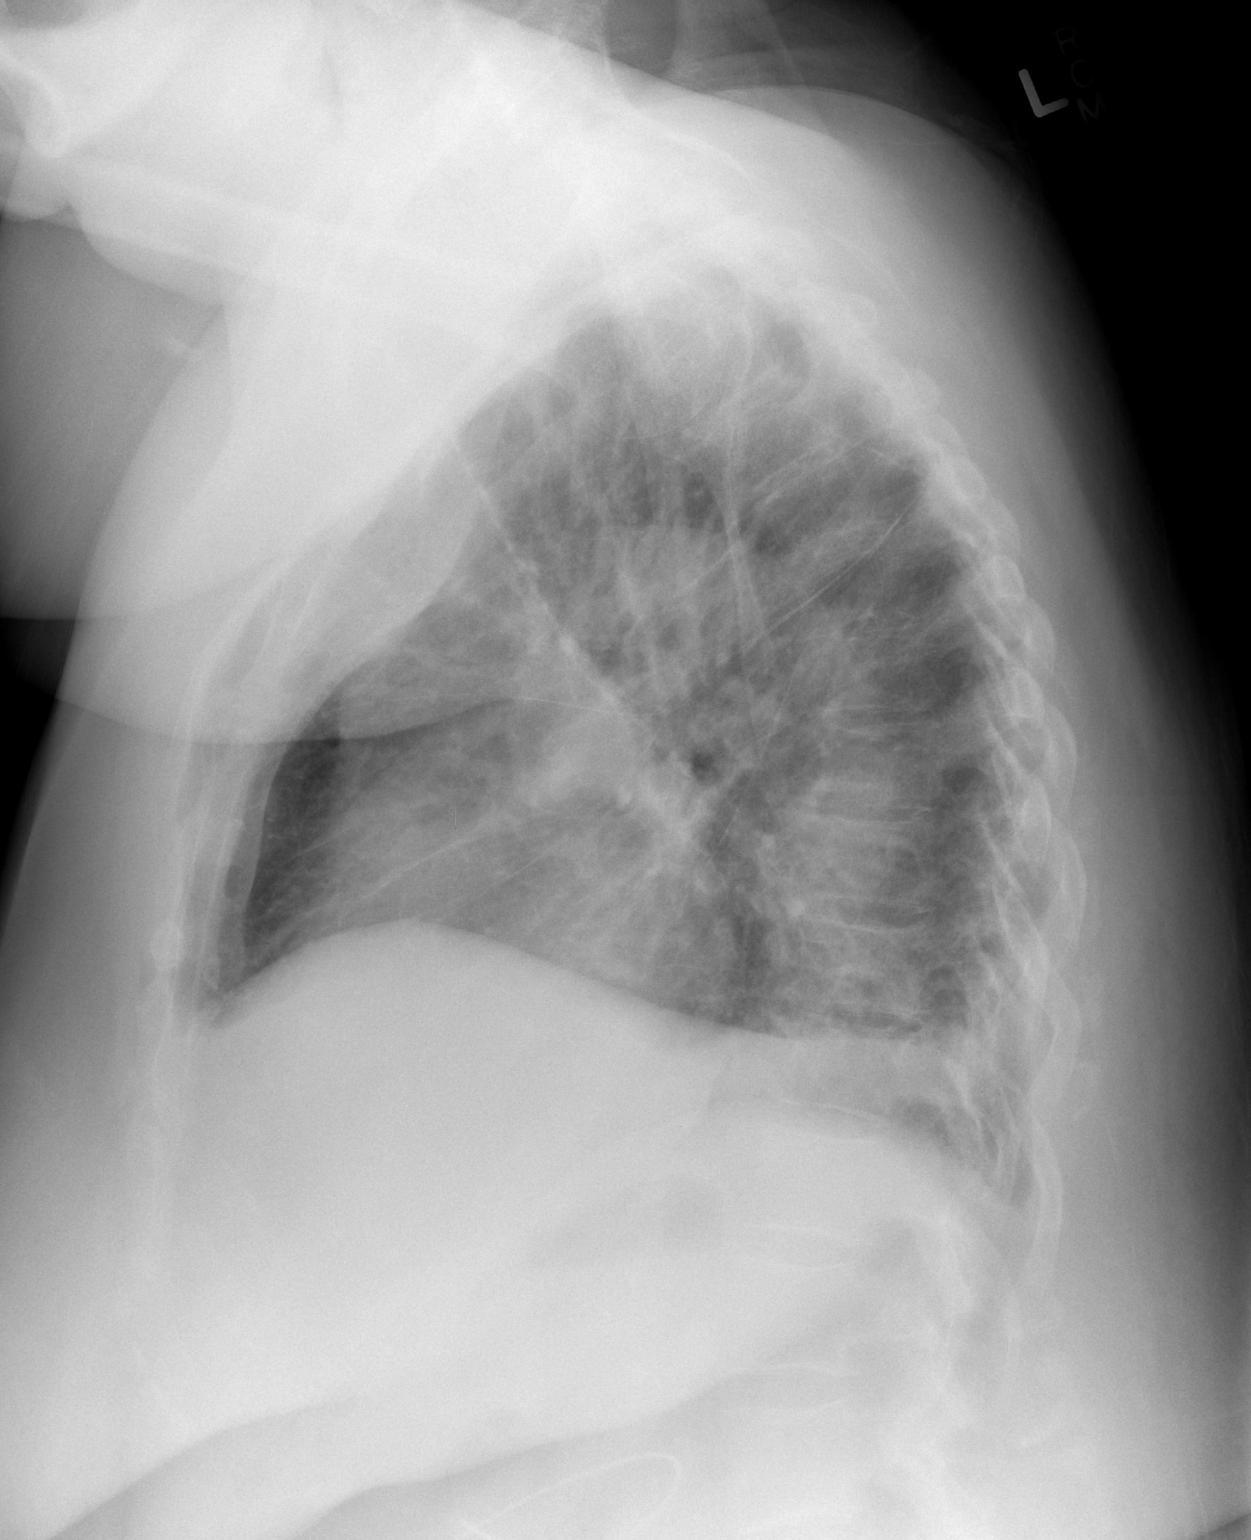

[2 of 2 positions shown; findings below may reference images not displayed]

FINDINGS: No active infiltrate or effusion is seen.  There is
cardiomegaly present and there may be mild pulmonary vascular
congestion present.  No acute bony abnormality is seen.
IMPRESSION: Cardiomegaly.  Question mild pulmonary vascular congestion.

## 2011-01-12 ENCOUNTER — Encounter: Payer: Self-pay | Admitting: Endocrinology

## 2011-01-12 ENCOUNTER — Other Ambulatory Visit (INDEPENDENT_AMBULATORY_CARE_PROVIDER_SITE_OTHER): Payer: Medicare Other

## 2011-01-12 ENCOUNTER — Ambulatory Visit (INDEPENDENT_AMBULATORY_CARE_PROVIDER_SITE_OTHER): Payer: Medicare Other | Admitting: Endocrinology

## 2011-01-12 DIAGNOSIS — E785 Hyperlipidemia, unspecified: Secondary | ICD-10-CM

## 2011-01-12 DIAGNOSIS — M810 Age-related osteoporosis without current pathological fracture: Secondary | ICD-10-CM

## 2011-01-12 DIAGNOSIS — R32 Unspecified urinary incontinence: Secondary | ICD-10-CM

## 2011-01-12 DIAGNOSIS — Z0000 Encounter for general adult medical examination without abnormal findings: Secondary | ICD-10-CM

## 2011-01-12 DIAGNOSIS — N189 Chronic kidney disease, unspecified: Secondary | ICD-10-CM

## 2011-01-12 DIAGNOSIS — I10 Essential (primary) hypertension: Secondary | ICD-10-CM

## 2011-01-12 DIAGNOSIS — Z79899 Other long term (current) drug therapy: Secondary | ICD-10-CM

## 2011-01-12 DIAGNOSIS — E039 Hypothyroidism, unspecified: Secondary | ICD-10-CM

## 2011-01-12 DIAGNOSIS — N2581 Secondary hyperparathyroidism of renal origin: Secondary | ICD-10-CM

## 2011-01-12 DIAGNOSIS — M109 Gout, unspecified: Secondary | ICD-10-CM

## 2011-01-12 DIAGNOSIS — E119 Type 2 diabetes mellitus without complications: Secondary | ICD-10-CM

## 2011-01-12 DIAGNOSIS — D61818 Other pancytopenia: Secondary | ICD-10-CM

## 2011-01-12 LAB — CBC WITH DIFFERENTIAL/PLATELET
Basophils Absolute: 0 10*3/uL (ref 0.0–0.1)
Eosinophils Absolute: 0.2 10*3/uL (ref 0.0–0.7)
HCT: 36.3 % (ref 36.0–46.0)
Lymphs Abs: 1.5 10*3/uL (ref 0.7–4.0)
MCHC: 32.3 g/dL (ref 30.0–36.0)
Monocytes Relative: 9.1 % (ref 3.0–12.0)
Platelets: 126 10*3/uL — ABNORMAL LOW (ref 150.0–400.0)
RDW: 13.7 % (ref 11.5–14.6)

## 2011-01-12 LAB — URINALYSIS, ROUTINE W REFLEX MICROSCOPIC
Bilirubin Urine: NEGATIVE
Hgb urine dipstick: NEGATIVE
Ketones, ur: NEGATIVE
Nitrite: NEGATIVE
Total Protein, Urine: NEGATIVE
Urine Glucose: NEGATIVE
pH: 5.5 (ref 5.0–8.0)

## 2011-01-12 LAB — IBC PANEL: Iron: 76 ug/dL (ref 42–145)

## 2011-01-12 LAB — LIPID PANEL
Cholesterol: 144 mg/dL (ref 0–200)
HDL: 45.8 mg/dL (ref >39.00)
LDL Cholesterol: 71 mg/dL (ref 0–99)
Total CHOL/HDL Ratio: 3
Triglycerides: 135 mg/dL (ref 0.0–149.0)
VLDL: 27 mg/dL (ref 0.0–40.0)

## 2011-01-12 LAB — IRON AND TIBC
Saturation Ratios: 26
TIBC: 272
UIBC: 200

## 2011-01-12 LAB — HEPATIC FUNCTION PANEL
ALT: 19 U/L (ref 0–35)
AST: 25 U/L (ref 0–37)
Bilirubin, Direct: 0.2 mg/dL (ref 0.0–0.3)
Total Protein: 6.8 g/dL (ref 6.0–8.3)

## 2011-01-12 LAB — BASIC METABOLIC PANEL
BUN: 53 mg/dL — ABNORMAL HIGH (ref 6–23)
CO2: 27 mEq/L (ref 19–32)
Calcium: 10 mg/dL (ref 8.4–10.5)
Chloride: 102 mEq/L (ref 96–112)
Creatinine, Ser: 1.6 mg/dL — ABNORMAL HIGH (ref 0.4–1.2)
Glucose, Bld: 159 mg/dL — ABNORMAL HIGH (ref 70–99)

## 2011-01-12 LAB — HEMOGLOBIN A1C: Hgb A1c MFr Bld: 7 % — ABNORMAL HIGH (ref 4.6–6.5)

## 2011-01-12 LAB — POCT HEMOGLOBIN-HEMACUE
Hemoglobin: 10.9 — ABNORMAL LOW
Operator id: 274481

## 2011-01-12 MED ORDER — TRIAMCINOLONE ACETONIDE 0.1 % EX OINT: TOPICAL_OINTMENT | Freq: Three times a day (TID) | CUTANEOUS | Status: DC

## 2011-01-12 NOTE — Progress Notes (Signed)
Subjective:    Patient ID: Stacy Walls, female    DOB: 1933/01/28, 77 y.o.   MRN: [id number]  HPI Subjective:   Patient here for Medicare annual wellness visit and management of other chronic and acute problems.     Risk factors: advanced age    57 of Physicians Providing Medical Care to Patient: Cardiol: wall Opthal: does not know Ortho: paul Nephrol: webb Gyn: henley   Activities of Daily Living: In your present state of health, do you have any difficulty performing the following activities?:  Preparing food and eating?: No  Bathing yourself: No  Getting dressed: No  Using the toilet:No  Moving around from place to place: No  In the past year have you fallen or had a near fall?: No    Home Safety: Has smoke detector and wears seat belts. No firearms.   Diet and Exercise  Current exercise habits: pt says good Dietary issues discussed: pt reports a healthy diet   Depression Screen  Q1: Over the past two weeks, have you felt down, depressed or hopeless? no  Q2: Over the past two weeks, have you felt little interest or pleasure in doing things? no   The following portions of the patient's history were reviewed and updated as appropriate: allergies, current medications, past family history, past medical history, past social history, past surgical history and problem list.  Past Medical History  Diagnosis Date  . Chest pain   . Clostridium difficile colitis   . Coronary atherosclerosis of native coronary vessel   . Chronic renal insufficiency   . HLD (hyperlipidemia)   . DM2 (diabetes mellitus, type 2)   . HTN (hypertension)   . Acute cholecystitis   . Dyshidrosis   . Routine general medical examination at a health care facility   . Secondary hyperparathyroidism (of renal origin)   . Hypothyroidism   . Dizziness   . Cough   . Neck pain   . Neck mass   . GERD (gastroesophageal reflux disease)   . Urinary tract infection   . Urinary incontinence   . Gout    . Osteoporosis   . Allergic rhinitis   . Anemia   . Diabetes mellitus     Past Surgical History  Procedure Date  . C-section (other) 1977  . L ankle surgery 1988  . Electrocardiogram 02/01/06  . Bone density 08/21/05  . Cholecystectomy   . Total left knee 2007    History   Social History  . Marital Status: Married    Spouse Name: N/A    Number of Children: N/A  . Years of Education: N/A   Occupational History  . Not on file.   Social History Main Topics  . Smoking status: Never Smoker   . Smokeless tobacco: Not on file  . Alcohol Use: No  . Drug Use: No  . Sexually Active:    Other Topics Concern  . Not on file   Social History Narrative   Lives with husband and has lived in [place] for over 30 years.     Current Outpatient Prescriptions on File Prior to Visit  Medication Sig Dispense Refill  . allopurinol (ZYLOPRIM) 300 MG tablet Take 1 tablet (300 mg total) by mouth daily.  90 tablet  1  . aspirin (ECOTRIN LOW STRENGTH) 81 MG EC tablet Take 81 mg by mouth daily.        Marland Kitchen atorvastatin (LIPITOR) 80 MG tablet Take 1 tablet (80 mg total) by mouth daily.  30 tablet  11  . bromocriptine (PARLODEL) 2.5 MG tablet Take 0.5 tablets (1.25 mg total) by mouth at bedtime.  15 tablet  11  . epoetin alfa (PROCRIT) 29562 UNIT/ML injection Inject 10,000 Units into the skin every 30 (thirty) days. Dr. Justin Mend      . Ferrous Sulfate (IRON) 325 (65 FE) MG TABS Take 1 tablet by mouth daily.       . furosemide (LASIX) 20 MG tablet 1 tablet by mouth once daily  90 tablet  3  . losartan-hydrochlorothiazide (HYZAAR) 100-25 MG per tablet Take 1 tablet by mouth daily.  90 tablet  3  . omeprazole (PRILOSEC) 20 MG capsule Take 1 capsule (20 mg total) by mouth daily.  90 capsule  3  . oxybutynin (DITROPAN) 5 MG tablet Take 1 tablet (5 mg total) by mouth daily.  90 tablet  1  . triamcinolone (KENALOG) 0.1 % ointment Apply topically 2 (two) times daily as needed.        Marland Kitchen JANUVIA 100 MG  tablet TAKE  1/2 TABLET BY MOUTH EVERY DAY  15 tablet  5  . polyethylene glycol powder (MIRALAX) powder Take 17 g by mouth daily.          Allergies  Allergen Reactions  . Azithromycin   . Pioglitazone     REACTION: edema  . Sulfonamide Derivatives     REACTION: rash  . Tramadol     Family History  Problem Relation Age of Onset  . Coronary artery disease Father   . Stroke Father   . Diabetes Father   . Diabetes Sister   . Stroke Sister   . Diabetes Brother     BP 128/82  Pulse 81  Temp(Src) 98 F (36.7 C) (Oral)  Ht 5\' 2"  (1.575 m)  Wt 270 lb 3.2 oz (122.562 kg)  BMI 49.42 kg/m2  SpO2 95%   Review of Systems  Denies hearing loss, and visual loss Objective:   Vision:  Pt is advised to see opthal Hearing: grossly normal Body mass index:  See vs page Msk: pt easily and quickly performs "get-up-and-go" from a sitting position Cognitive Impairment Assessment: cognition, memory and judgment appear normal.  remembers 3/3 at 5 minutes.  excellent recall.  can easily read and write a sentence.  alert and oriented x 3   Assessment:   Medicare wellness utd on preventive parameters    Plan:   During the course of the visit the patient was educated and counseled about appropriate screening and preventive services including:        Fall prevention   Screening mammography  Bone densitometry screening  Diabetes screening  Nutrition counseling   Vaccines / LABS Zostavax / Pnemonccoal Vaccine  today  PSA  Patient Instructions (the written plan) was given to the patient.        Review of Systems     Objective:   Physical Exam        Assessment & Plan:     SEPARATE EVALUATION FOLLOWS-- HISTORY OF THE PRESENT ILLNESS: The state of at least three ongoing medical problems is addressed today: Pt takes dm meds as rx'ed.  She denies weight change Chronically abnl cbc.  Denies brbpr Dyslipidemia:  Denies chest pain Pt has chronic renal insuff.   Denies  hematuria PAST MEDICAL HISTORY reviewed and up to date today REVIEW OF SYSTEMS: Denies sob/n/v/loc/headache/depression PHYSICAL EXAMINATION: VITAL SIGNS:  See vs page GENERAL: no distress PSYCH: Alert and oriented x 3.  Does  not appear anxious nor depressed. LAB/XRAY RESULTS: Lab Results  Component Value Date   WBC 5.5 01/12/2011   HGB 11.7* 01/12/2011   HCT 36.3 01/12/2011   PLT 126.0* 01/12/2011   CHOL 144 01/12/2011   TRIG 135.0 01/12/2011   HDL 45.80 01/12/2011   LDLDIRECT 108.6 11/10/2010   ALT 19 01/12/2011   AST 25 01/12/2011   NA 138 01/12/2011   K 3.9 01/12/2011   CL 102 01/12/2011   CREATININE 1.6* 01/12/2011   BUN 53* 01/12/2011   CO2 27 01/12/2011   TSH 4.24 01/12/2011   HGBA1C 7.0* 01/12/2011   MICROALBUR 0.2 01/12/2011   IMPRESSION: Dm, well-controlled Renal insuff, stable abnl cbc, stable Dyslipidemia, on lipitos PLAN: See instruction page

## 2011-01-12 NOTE — Patient Instructions (Addendum)
please consider these measures for your health:  minimize alcohol.  do not use tobacco products.  have a colonoscopy at least every 10 years from age 75.  Women should have an annual mammogram from age 32.  [facility].  keep firearms safely stored.  always use seat belts.  have working smoke alarms in your home.  see an eye doctor and dentist regularly.  never drive under the influence of alcohol or drugs (including prescription drugs).  please let me know what your wishes would be, if artificial life support measures should become necessary.  it is critically important to prevent falling down (keep floor areas well-lit, dry, and free of loose objects.  Also, try not to rush) Please make a follow-up appointment in 4 months. good diet and exercise habits significanly improve the control of your diabetes.  please let me know if you wish to be referred to a dietician.  high blood sugar is very risky to your health.  you should see an eye doctor every year. controlling your blood pressure and cholesterol drastically reduces the damage diabetes does to your body.  this also applies to quitting smoking.  please discuss these with your doctor.  you should take an aspirin every day, unless you have been advised by a doctor not to. blood tests are being requested for you today.  please call [phone] to hear your test results.  You will be prompted to enter the 9-digit "MRN" number that appears at the top left of this page, followed by #.  Then you will hear the message. (update: i left message on phone-tree:  rx as we discussed)

## 2011-01-13 LAB — PTH, INTACT AND CALCIUM
Calcium, Total (PTH): 10.5 mg/dL (ref 8.4–10.5)
PTH: 115.6 pg/mL — ABNORMAL HIGH (ref 14.0–72.0)

## 2011-01-13 LAB — CBC
HCT: 33 — ABNORMAL LOW
Hemoglobin: 11.1 — ABNORMAL LOW
MCHC: 33.7
Platelets: 123 — ABNORMAL LOW
RDW: 15.1

## 2011-01-13 LAB — IRON AND TIBC
Saturation Ratios: 31
TIBC: 262

## 2011-01-15 ENCOUNTER — Telehealth: Payer: Self-pay

## 2011-01-15 ENCOUNTER — Ambulatory Visit: Payer: Self-pay | Admitting: Cardiology

## 2011-01-15 LAB — HEMOGLOBIN AND HEMATOCRIT, BLOOD
HCT: 33.8 — ABNORMAL LOW
Hemoglobin: 11.5 — ABNORMAL LOW

## 2011-01-15 LAB — IRON AND TIBC
Iron: 68
Saturation Ratios: 28
Saturation Ratios: 31
TIBC: 246 — ABNORMAL LOW
TIBC: 256
UIBC: 178
UIBC: 176

## 2011-01-15 LAB — CBC
HCT: 34.9 — ABNORMAL LOW
MCHC: 33.2
MCV: 95.4
RBC: 3.66 — ABNORMAL LOW
WBC: 3.7 — ABNORMAL LOW

## 2011-01-15 LAB — FERRITIN: Ferritin: 123 (ref 10–291)

## 2011-01-15 MED ORDER — TRIAMCINOLONE ACETONIDE 0.1 % EX OINT: TOPICAL_OINTMENT | Freq: Three times a day (TID) | CUTANEOUS | Status: DC

## 2011-01-15 NOTE — Telephone Encounter (Signed)
Pt called requesting refill and quantity change.

## 2011-01-16 LAB — POCT I-STAT 4, (NA,K, GLUC, HGB,HCT)
HCT: 40
Hemoglobin: 13.6
Potassium: 4.7

## 2011-01-16 LAB — IRON AND TIBC: Saturation Ratios: 26

## 2011-01-19 LAB — POCT HEMOGLOBIN-HEMACUE: Hemoglobin: 10.5 g/dL — ABNORMAL LOW (ref 12.0–15.0)

## 2011-01-20 LAB — CBC
MCV: 97.9 fL (ref 78.0–100.0)
Platelets: 136 10*3/uL — ABNORMAL LOW (ref 150–400)
RBC: 3.68 MIL/uL — ABNORMAL LOW (ref 3.87–5.11)
WBC: 2.8 10*3/uL — ABNORMAL LOW (ref 4.0–10.5)

## 2011-01-20 LAB — POCT HEMOGLOBIN-HEMACUE
Hemoglobin: 11.5 g/dL — ABNORMAL LOW (ref 12.0–15.0)
Hemoglobin: 7.6 g/dL — CL (ref 12.0–15.0)

## 2011-01-20 LAB — IRON AND TIBC
Iron: 76 ug/dL (ref 42–135)
UIBC: 192 ug/dL

## 2011-01-20 LAB — FERRITIN: Ferritin: 54 ng/mL (ref 10–291)

## 2011-01-21 LAB — IRON AND TIBC
Iron: 74
Saturation Ratios: 28
TIBC: 261
UIBC: 187

## 2011-01-21 LAB — FERRITIN: Ferritin: 223 (ref 10–291)

## 2011-01-22 ENCOUNTER — Encounter (HOSPITAL_COMMUNITY): Payer: Medicare Other

## 2011-01-23 LAB — POCT HEMOGLOBIN-HEMACUE
Hemoglobin: 11.1 g/dL — ABNORMAL LOW (ref 12.0–15.0)
Hemoglobin: 11.1 g/dL — ABNORMAL LOW (ref 12.0–15.0)

## 2011-01-23 LAB — FERRITIN: Ferritin: 187 ng/mL (ref 10–291)

## 2011-01-26 LAB — CBC
Hemoglobin: 11.1 — ABNORMAL LOW
RBC: 3.61 — ABNORMAL LOW
WBC: 2.8 — ABNORMAL LOW

## 2011-01-26 LAB — IRON AND TIBC
Iron: 78
Saturation Ratios: 29
TIBC: 265

## 2011-01-26 LAB — FERRITIN: Ferritin: 77 (ref 10–291)

## 2011-01-27 LAB — FERRITIN: Ferritin: 83 (ref 10–291)

## 2011-01-27 LAB — CBC
Hemoglobin: 10.7 — ABNORMAL LOW
MCHC: 33.3
WBC: 2.9 — ABNORMAL LOW

## 2011-01-29 ENCOUNTER — Encounter (HOSPITAL_COMMUNITY)
Admission: RE | Admit: 2011-01-29 | Discharge: 2011-01-29 | Disposition: A | Discharge status: Home / Self Care | Payer: Medicare Other | Source: Ambulatory Visit | Attending: Nephrology | Admitting: Nephrology

## 2011-01-29 ENCOUNTER — Other Ambulatory Visit: Payer: Self-pay | Admitting: Nephrology

## 2011-01-29 DIAGNOSIS — D638 Anemia in other chronic diseases classified elsewhere: Secondary | ICD-10-CM | POA: Insufficient documentation

## 2011-01-29 DIAGNOSIS — N184 Chronic kidney disease, stage 4 (severe): Secondary | ICD-10-CM | POA: Insufficient documentation

## 2011-01-29 LAB — CBC
HCT: 34.4 — ABNORMAL LOW
MCV: 93.6
Platelets: 114 — ABNORMAL LOW
WBC: 2.8 — ABNORMAL LOW

## 2011-01-29 LAB — IRON AND TIBC
TIBC: 223 ug/dL — ABNORMAL LOW (ref 250–470)
UIBC: 186

## 2011-01-29 LAB — FERRITIN: Ferritin: 81 (ref 10–291)

## 2011-01-30 LAB — IRON AND TIBC
Iron: 65
TIBC: 269
UIBC: 204

## 2011-01-30 LAB — HEMOGLOBIN AND HEMATOCRIT, BLOOD: Hemoglobin: 11 — ABNORMAL LOW

## 2011-02-02 LAB — HEMOGLOBIN AND HEMATOCRIT, BLOOD
HCT: 31.5 — ABNORMAL LOW
Hemoglobin: 10.4 — ABNORMAL LOW

## 2011-02-02 LAB — IRON AND TIBC: TIBC: 229 — ABNORMAL LOW

## 2011-02-03 LAB — IRON AND TIBC
Saturation Ratios: 30
TIBC: 246 — ABNORMAL LOW
UIBC: 171

## 2011-02-03 LAB — HEMOGLOBIN AND HEMATOCRIT, BLOOD: HCT: 30.9 — ABNORMAL LOW

## 2011-02-05 LAB — HEMOGLOBIN AND HEMATOCRIT, BLOOD
HCT: 31.2 — ABNORMAL LOW
Hemoglobin: 10.2 — ABNORMAL LOW

## 2011-02-05 LAB — IRON AND TIBC
Saturation Ratios: 26
UIBC: 210

## 2011-02-12 ENCOUNTER — Ambulatory Visit: Payer: Medicare Other | Admitting: Endocrinology

## 2011-02-17 ENCOUNTER — Other Ambulatory Visit (HOSPITAL_COMMUNITY): Payer: Self-pay | Admitting: *Deleted

## 2011-02-18 ENCOUNTER — Ambulatory Visit: Payer: Self-pay | Admitting: Cardiology

## 2011-02-26 ENCOUNTER — Encounter (HOSPITAL_COMMUNITY)
Admission: RE | Admit: 2011-02-26 | Discharge: 2011-02-26 | Disposition: A | Discharge status: Home / Self Care | Payer: Medicare Other | Source: Ambulatory Visit | Attending: Nephrology | Admitting: Nephrology

## 2011-02-26 ENCOUNTER — Encounter (HOSPITAL_COMMUNITY): Payer: Medicare Other | Attending: Nephrology

## 2011-02-26 DIAGNOSIS — N184 Chronic kidney disease, stage 4 (severe): Secondary | ICD-10-CM | POA: Insufficient documentation

## 2011-02-26 DIAGNOSIS — D638 Anemia in other chronic diseases classified elsewhere: Secondary | ICD-10-CM | POA: Insufficient documentation

## 2011-02-26 LAB — IRON AND TIBC: Iron: 69 ug/dL (ref 42–135)

## 2011-02-26 MED ORDER — EPOETIN ALFA 10000 UNIT/ML IJ SOLN
10000.0000 [IU] | INTRAMUSCULAR | Status: DC
  Administered 2011-02-26: 10000 [IU] via SUBCUTANEOUS
  Filled 2011-02-26: qty 1

## 2011-03-17 ENCOUNTER — Encounter: Payer: Self-pay | Admitting: Cardiology

## 2011-03-17 ENCOUNTER — Ambulatory Visit (INDEPENDENT_AMBULATORY_CARE_PROVIDER_SITE_OTHER): Payer: Medicare Other | Admitting: Cardiology

## 2011-03-17 VITALS — BP 118/72 | HR 65 | Ht 62.0 in | Wt 267.0 lb

## 2011-03-17 DIAGNOSIS — I2510 Atherosclerotic heart disease of native coronary artery without angina pectoris: Secondary | ICD-10-CM

## 2011-03-17 DIAGNOSIS — I10 Essential (primary) hypertension: Secondary | ICD-10-CM

## 2011-03-17 DIAGNOSIS — N189 Chronic kidney disease, unspecified: Secondary | ICD-10-CM

## 2011-03-17 DIAGNOSIS — E785 Hyperlipidemia, unspecified: Secondary | ICD-10-CM

## 2011-03-17 DIAGNOSIS — E119 Type 2 diabetes mellitus without complications: Secondary | ICD-10-CM

## 2011-03-17 NOTE — Patient Instructions (Signed)
Your physician wants you to follow-up in: 1 year with Dr. Verl Blalock. You will receive a reminder letter in the mail two months in advance. If you don't receive a letter, please call our office to schedule the follow-up appointment.

## 2011-03-17 NOTE — Progress Notes (Signed)
HPI Stacy Walls returns today for followup evaluation of her coronary artery disease.  She is doing well with no symptoms of chest pain, angina, breath. She is around pretty well despite using a cane in her size.  Blood work followed by Dr. Loanne Drilling. Her blood work did show relatively good control of her diabetes.  Medications reviewed and she is compliant.  Past Medical History  Diagnosis Date  . Chest pain   . Clostridium difficile colitis   . Coronary atherosclerosis of native coronary vessel   . Chronic renal insufficiency   . HLD (hyperlipidemia)   . DM2 (diabetes mellitus, type 2)   . HTN (hypertension)   . Acute cholecystitis   . Dyshidrosis   . Routine general medical examination at a health care facility   . Secondary hyperparathyroidism (of renal origin)   . Hypothyroidism   . Dizziness   . Cough   . Neck pain   . Neck mass   . GERD (gastroesophageal reflux disease)   . Urinary tract infection   . Urinary incontinence   . Gout   . Osteoporosis   . Allergic rhinitis   . Anemia   . Diabetes mellitus     Current Outpatient Prescriptions  Medication Sig Dispense Refill  . allopurinol (ZYLOPRIM) 300 MG tablet Take 1 tablet (300 mg total) by mouth daily.  90 tablet  1  . aspirin (ECOTRIN LOW STRENGTH) 81 MG EC tablet Take 81 mg by mouth daily.        Marland Kitchen atorvastatin (LIPITOR) 80 MG tablet Take 1 tablet (80 mg total) by mouth daily.  30 tablet  11  . bromocriptine (PARLODEL) 2.5 MG tablet Take 0.5 tablets (1.25 mg total) by mouth at bedtime.  15 tablet  11  . epoetin alfa (PROCRIT) 09811 UNIT/ML injection Inject 10,000 Units into the skin every 30 (thirty) days. Dr. Justin Mend      . Ferrous Sulfate (IRON) 325 (65 FE) MG TABS Take 1 tablet by mouth daily.       . furosemide (LASIX) 20 MG tablet 1 tablet by mouth once daily  90 tablet  3  . JANUVIA 100 MG tablet TAKE  1/2 TABLET BY MOUTH EVERY DAY  15 tablet  5  . losartan-hydrochlorothiazide (HYZAAR) 100-25 MG per tablet  Take 1 tablet by mouth daily.  90 tablet  3  . omeprazole (PRILOSEC) 20 MG capsule Take 1 capsule (20 mg total) by mouth daily.  90 capsule  3  . oxybutynin (DITROPAN) 5 MG tablet Take 1 tablet (5 mg total) by mouth daily.  90 tablet  1  . polyethylene glycol powder (MIRALAX) powder Take 17 g by mouth daily.        Marland Kitchen triamcinolone (KENALOG) 0.1 % ointment Apply topically 3 (three) times daily. Prn itching  454 g  1    Allergies  Allergen Reactions  . Azithromycin   . Pioglitazone     REACTION: edema  . Sulfonamide Derivatives     REACTION: rash  . Tramadol     Family History  Problem Relation Age of Onset  . Coronary artery disease Father   . Stroke Father   . Diabetes Father   . Diabetes Sister   . Stroke Sister   . Diabetes Brother     History   Social History  . Marital Status: Married    Spouse Name: N/A    Number of Children: N/A  . Years of Education: N/A   Occupational History  .  Not on file.   Social History Main Topics  . Smoking status: Never Smoker   . Smokeless tobacco: Not on file  . Alcohol Use: No  . Drug Use: No  . Sexually Active:    Other Topics Concern  . Not on file   Social History Narrative   Lives with husband and has lived in [place] for over 30 years.     ROS ALL NEGATIVE EXCEPT THOSE NOTED IN HPI  PE  General Appearance: well developed, well nourished in no acute distress, obese HEENT: symmetrical face, PERRLA, good dentition  Neck: no JVD, thyromegaly, or adenopathy, trachea midline Chest: symmetric without deformity Cardiac: PMI non-displaced, RRR, normal S1, S2, no gallop or murmur Lung: clear to ausculation and percussion Vascular: all pulses full without bruits  Abdominal: nondistended, nontender, good bowel sounds, no HSM, no bruits Extremities: no cyanosis, clubbing or edema, no sign of DVT, no varicosities  Skin: normal color, no rashes Neuro: alert and oriented x 3, non-focal Pysch: normal affect  EKG  BMET     Component Value Date/Time   NA 138 01/12/2011 1116   K 3.9 01/12/2011 1116   CL 102 01/12/2011 1116   CO2 27 01/12/2011 1116   GLUCOSE 159* 01/12/2011 1116   BUN 53* 01/12/2011 1116   CREATININE 1.6* 01/12/2011 1116   CALCIUM 10.5 01/12/2011 1116   CALCIUM 10.0 01/12/2011 1116   GFRNONAA 26* 02/24/2010 1358   GFRAA  Value: 31        The eGFR has been calculated using the MDRD equation. This calculation has not been validated in all clinical situations. eGFR's persistently <60 mL/min signify possible Chronic Kidney Disease.* 02/24/2010 1358    Lipid Panel     Component Value Date/Time   CHOL 144 01/12/2011 1116   TRIG 135.0 01/12/2011 1116   HDL 45.80 01/12/2011 1116   CHOLHDL 3 01/12/2011 1116   VLDL 27.0 01/12/2011 1116   LDLCALC 71 01/12/2011 1116    CBC    Component Value Date/Time   WBC 5.5 01/12/2011 1116   RBC 3.78* 01/12/2011 1116   HGB 11.1* 02/26/2011 1448   HCT 36.3 01/12/2011 1116   PLT 126.0* 01/12/2011 1116   MCV 96.1 01/12/2011 1116   MCHC 32.3 01/12/2011 1116   RDW 13.7 01/12/2011 1116   LYMPHSABS 1.5 01/12/2011 1116   MONOABS 0.5 01/12/2011 1116   EOSABS 0.2 01/12/2011 1116   BASOSABS 0.0 01/12/2011 1116

## 2011-03-17 NOTE — Assessment & Plan Note (Signed)
Stable. No change in medical therapy. Following clinic in one year.

## 2011-03-26 ENCOUNTER — Encounter (HOSPITAL_COMMUNITY)
Admission: RE | Admit: 2011-03-26 | Discharge: 2011-03-26 | Disposition: A | Discharge status: Home / Self Care | Payer: Medicare Other | Source: Ambulatory Visit | Attending: Nephrology | Admitting: Nephrology

## 2011-03-26 DIAGNOSIS — D638 Anemia in other chronic diseases classified elsewhere: Secondary | ICD-10-CM | POA: Insufficient documentation

## 2011-03-26 DIAGNOSIS — N184 Chronic kidney disease, stage 4 (severe): Secondary | ICD-10-CM | POA: Insufficient documentation

## 2011-03-26 LAB — POCT HEMOGLOBIN-HEMACUE: Hemoglobin: 11.4 g/dL — ABNORMAL LOW (ref 12.0–15.0)

## 2011-03-26 LAB — IRON AND TIBC
Iron: 63 ug/dL (ref 42–135)
Saturation Ratios: 33 % (ref 20–55)
TIBC: 190 ug/dL — ABNORMAL LOW (ref 250–470)
UIBC: 127 ug/dL (ref 125–400)

## 2011-03-26 LAB — FERRITIN: Ferritin: 841 ng/mL — ABNORMAL HIGH (ref 10–291)

## 2011-03-26 MED ORDER — EPOETIN ALFA 10000 UNIT/ML IJ SOLN
10000.0000 [IU] | INTRAMUSCULAR | Status: DC
  Administered 2011-03-26: 10000 [IU] via SUBCUTANEOUS

## 2011-03-26 MED ORDER — EPOETIN ALFA 10000 UNIT/ML IJ SOLN
INTRAMUSCULAR | Status: AC
  Filled 2011-03-26: qty 1

## 2011-04-23 ENCOUNTER — Encounter (HOSPITAL_COMMUNITY): Payer: Medicare Other

## 2011-04-28 ENCOUNTER — Encounter (HOSPITAL_COMMUNITY)
Admission: RE | Admit: 2011-04-28 | Discharge: 2011-04-28 | Disposition: A | Discharge status: Home / Self Care | Payer: Medicare Other | Source: Ambulatory Visit | Attending: Nephrology | Admitting: Nephrology

## 2011-04-28 DIAGNOSIS — D638 Anemia in other chronic diseases classified elsewhere: Secondary | ICD-10-CM | POA: Diagnosis not present

## 2011-04-28 DIAGNOSIS — N184 Chronic kidney disease, stage 4 (severe): Secondary | ICD-10-CM | POA: Diagnosis not present

## 2011-04-28 LAB — IRON AND TIBC
Saturation Ratios: 26 % (ref 20–55)
TIBC: 221 ug/dL — ABNORMAL LOW (ref 250–470)

## 2011-04-28 MED ORDER — EPOETIN ALFA 10000 UNIT/ML IJ SOLN
10000.0000 [IU] | INTRAMUSCULAR | Status: DC
  Filled 2011-04-28: qty 1

## 2011-04-29 MED FILL — Epoetin Alfa Inj 10000 Unit/ML: INTRAMUSCULAR | Qty: 1 | Status: AC

## 2011-05-21 ENCOUNTER — Encounter (HOSPITAL_COMMUNITY): Payer: Medicare Other

## 2011-05-25 ENCOUNTER — Other Ambulatory Visit (HOSPITAL_COMMUNITY): Payer: Self-pay | Admitting: *Deleted

## 2011-05-26 ENCOUNTER — Encounter (HOSPITAL_COMMUNITY)
Admission: RE | Admit: 2011-05-26 | Discharge: 2011-05-26 | Disposition: A | Discharge status: Home / Self Care | Payer: Medicare Other | Source: Ambulatory Visit | Attending: Nephrology | Admitting: Nephrology

## 2011-05-26 DIAGNOSIS — N184 Chronic kidney disease, stage 4 (severe): Secondary | ICD-10-CM | POA: Insufficient documentation

## 2011-05-26 DIAGNOSIS — D638 Anemia in other chronic diseases classified elsewhere: Secondary | ICD-10-CM | POA: Diagnosis not present

## 2011-05-26 LAB — IRON AND TIBC
Saturation Ratios: 39 % (ref 20–55)
UIBC: 128 ug/dL (ref 125–400)

## 2011-05-26 LAB — FERRITIN: Ferritin: 737 ng/mL — ABNORMAL HIGH (ref 10–291)

## 2011-05-26 LAB — RENAL FUNCTION PANEL
Albumin: 3.3 g/dL — ABNORMAL LOW (ref 3.5–5.2)
Chloride: 98 mEq/L (ref 96–112)
Creatinine, Ser: 2 mg/dL — ABNORMAL HIGH (ref 0.50–1.10)
GFR calc non Af Amer: 23 mL/min — ABNORMAL LOW (ref >90)
Phosphorus: 2.7 mg/dL (ref 2.3–4.6)
Potassium: 4.2 mEq/L (ref 3.5–5.1)

## 2011-05-26 MED ORDER — EPOETIN ALFA 10000 UNIT/ML IJ SOLN
10000.0000 [IU] | INTRAMUSCULAR | Status: DC
  Administered 2011-05-26: 10000 [IU] via SUBCUTANEOUS

## 2011-05-26 MED ORDER — EPOETIN ALFA 10000 UNIT/ML IJ SOLN
INTRAMUSCULAR | Status: AC
  Filled 2011-05-26: qty 1

## 2011-06-23 ENCOUNTER — Other Ambulatory Visit (HOSPITAL_COMMUNITY): Payer: Self-pay | Admitting: *Deleted

## 2011-06-25 ENCOUNTER — Encounter (HOSPITAL_COMMUNITY)
Admission: RE | Admit: 2011-06-25 | Discharge: 2011-06-25 | Disposition: A | Discharge status: Home / Self Care | Payer: Medicare Other | Source: Ambulatory Visit | Attending: Nephrology | Admitting: Nephrology

## 2011-06-25 DIAGNOSIS — D638 Anemia in other chronic diseases classified elsewhere: Secondary | ICD-10-CM | POA: Diagnosis not present

## 2011-06-25 DIAGNOSIS — N184 Chronic kidney disease, stage 4 (severe): Secondary | ICD-10-CM | POA: Diagnosis not present

## 2011-06-25 LAB — IRON AND TIBC
Iron: 62 ug/dL (ref 42–135)
UIBC: 144 ug/dL (ref 125–400)

## 2011-06-25 LAB — POCT HEMOGLOBIN-HEMACUE: Hemoglobin: 11 g/dL — ABNORMAL LOW (ref 12.0–15.0)

## 2011-06-25 MED ORDER — EPOETIN ALFA 10000 UNIT/ML IJ SOLN
INTRAMUSCULAR | Status: AC
  Administered 2011-06-25: 10000 [IU] via SUBCUTANEOUS
  Filled 2011-06-25: qty 1

## 2011-06-25 MED ORDER — EPOETIN ALFA 10000 UNIT/ML IJ SOLN: 10000.0000 [IU] | INTRAMUSCULAR | Status: DC

## 2011-06-26 ENCOUNTER — Other Ambulatory Visit: Payer: Self-pay | Admitting: Endocrinology

## 2011-07-02 ENCOUNTER — Ambulatory Visit: Payer: Medicare Other | Admitting: Endocrinology

## 2011-07-06 ENCOUNTER — Ambulatory Visit (INDEPENDENT_AMBULATORY_CARE_PROVIDER_SITE_OTHER): Payer: Medicare Other | Admitting: Endocrinology

## 2011-07-06 ENCOUNTER — Encounter: Payer: Self-pay | Admitting: Endocrinology

## 2011-07-06 ENCOUNTER — Other Ambulatory Visit (INDEPENDENT_AMBULATORY_CARE_PROVIDER_SITE_OTHER): Payer: Medicare Other

## 2011-07-06 VITALS — BP 134/74 | HR 80 | Temp 97.7°F | Ht 64.0 in | Wt 263.0 lb

## 2011-07-06 DIAGNOSIS — E1129 Type 2 diabetes mellitus with other diabetic kidney complication: Secondary | ICD-10-CM

## 2011-07-06 DIAGNOSIS — N058 Unspecified nephritic syndrome with other morphologic changes: Secondary | ICD-10-CM

## 2011-07-06 DIAGNOSIS — E119 Type 2 diabetes mellitus without complications: Secondary | ICD-10-CM | POA: Diagnosis not present

## 2011-07-06 LAB — HEMOGLOBIN A1C: Hgb A1c MFr Bld: 7.2 % — ABNORMAL HIGH (ref 4.6–6.5)

## 2011-07-06 MED ORDER — TRIAMCINOLONE ACETONIDE 0.1 % EX OINT: TOPICAL_OINTMENT | Freq: Three times a day (TID) | CUTANEOUS | Status: DC

## 2011-07-06 MED ORDER — BROMOCRIPTINE MESYLATE 2.5 MG PO TABS: 2.5000 mg | ORAL_TABLET | Freq: Every day | ORAL | Status: DC

## 2011-07-06 NOTE — Patient Instructions (Addendum)
blood tests are being requested for you today.  You will receive a letter with results.  Please continue the same medications for now. (see letter)

## 2011-07-06 NOTE — Progress Notes (Signed)
Subjective:    Patient ID: Stacy Walls, female    DOB: [birth year], 78 y.o.   MRN: [id number]  HPI Pt returns for f/u of type 2 DM (1996).  pt states she feels well in general.  no cbg record, but states cbg's are well-controlled. Past Medical History  Diagnosis Date  . Chest pain   . Clostridium difficile colitis   . Coronary atherosclerosis of native coronary vessel   . Chronic renal insufficiency   . HLD (hyperlipidemia)   . DM2 (diabetes mellitus, type 2)   . HTN (hypertension)   . Acute cholecystitis   . Dyshidrosis   . Routine general medical examination at a health care facility   . Secondary hyperparathyroidism (of renal origin)   . Hypothyroidism   . Dizziness   . Cough   . Neck pain   . Neck mass   . GERD (gastroesophageal reflux disease)   . Urinary tract infection   . Urinary incontinence   . Gout   . Osteoporosis   . Allergic rhinitis   . Anemia   . Diabetes mellitus     Past Surgical History  Procedure Date  . C-section (other) 1977  . L ankle surgery 1988  . Electrocardiogram 02/01/06  . Bone density 08/21/05  . Cholecystectomy   . Total left knee 2007    History   Social History  . Marital Status: Married    Spouse Name: N/A    Number of Children: N/A  . Years of Education: N/A   Occupational History  . Not on file.   Social History Main Topics  . Smoking status: Never Smoker   . Smokeless tobacco: Not on file  . Alcohol Use: No  . Drug Use: No  . Sexually Active:    Other Topics Concern  . Not on file   Social History Narrative   Lives with husband and has lived in [place] for over 30 years.     Current Outpatient Prescriptions on File Prior to Visit  Medication Sig Dispense Refill  . allopurinol (ZYLOPRIM) 300 MG tablet Take 1 tablet (300 mg total) by mouth daily.  90 tablet  1  . aspirin (ECOTRIN LOW STRENGTH) 81 MG EC tablet Take 81 mg by mouth daily.        Marland Kitchen atorvastatin (LIPITOR) 80 MG tablet Take 1 tablet (80 mg  total) by mouth daily.  30 tablet  11  . epoetin alfa (PROCRIT) 73710 UNIT/ML injection Inject 10,000 Units into the skin every 30 (thirty) days. Dr. Justin Mend      . Ferrous Sulfate (IRON) 325 (65 FE) MG TABS Take 1 tablet by mouth daily.       . furosemide (LASIX) 20 MG tablet 1 tablet by mouth once daily  90 tablet  3  . JANUVIA 100 MG tablet TAKE  1/2 TABLET BY MOUTH EVERY DAY  15 tablet  5  . losartan-hydrochlorothiazide (HYZAAR) 100-25 MG per tablet Take 1 tablet by mouth daily.  90 tablet  3  . omeprazole (PRILOSEC) 20 MG capsule Take 1 capsule (20 mg total) by mouth daily.  90 capsule  3  . oxybutynin (DITROPAN) 5 MG tablet Take 1 tablet (5 mg total) by mouth daily.  90 tablet  1    Allergies  Allergen Reactions  . Azithromycin   . Pioglitazone     REACTION: edema  . Sulfonamide Derivatives     REACTION: rash  . Tramadol     Family History  Problem  Relation Age of Onset  . Coronary artery disease Father   . Stroke Father   . Diabetes Father   . Diabetes Sister   . Stroke Sister   . Diabetes Brother     BP 134/74  Pulse 80  Temp(Src) 97.7 F (36.5 C) (Oral)  Ht 5\' 4"  (1.626 m)  Wt 263 lb (119.296 kg)  BMI 45.14 kg/m2  SpO2 94%    Review of Systems Denies weight change.      Objective:   Physical Exam VITAL SIGNS:  See vs page GENERAL: no distress Pulses: dorsalis pedis intact bilat.   Feet: no deformity.  no ulcer on the feet.  feet are of normal color and temp.  Trace bilat leg edema Neuro: sensation is intact to touch on the feet    Lab Results  Component Value Date   HGBA1C 7.2* 07/06/2011      Assessment & Plan:  Dm, Needs increased rx, if it can be done with a regimen that avoids or minimizes hypoglycemia.

## 2011-07-07 ENCOUNTER — Telehealth: Payer: Self-pay | Admitting: *Deleted

## 2011-07-07 NOTE — Telephone Encounter (Signed)
Pt informed of lab results of A1c and of increased medication dosage of Bromocriptine. Letter also mailed to pt with results.

## 2011-07-11 ENCOUNTER — Other Ambulatory Visit: Payer: Self-pay | Admitting: Endocrinology

## 2011-07-13 ENCOUNTER — Ambulatory Visit: Payer: Medicare Other | Admitting: Endocrinology

## 2011-07-23 ENCOUNTER — Encounter (HOSPITAL_COMMUNITY): Payer: Medicare Other

## 2011-07-29 ENCOUNTER — Encounter (HOSPITAL_COMMUNITY): Payer: Medicare Other

## 2011-07-30 ENCOUNTER — Encounter (HOSPITAL_COMMUNITY)
Admission: RE | Admit: 2011-07-30 | Discharge: 2011-07-30 | Disposition: A | Discharge status: Home / Self Care | Payer: Medicare Other | Source: Ambulatory Visit | Attending: Nephrology | Admitting: Nephrology

## 2011-07-30 DIAGNOSIS — N184 Chronic kidney disease, stage 4 (severe): Secondary | ICD-10-CM | POA: Diagnosis not present

## 2011-07-30 DIAGNOSIS — D638 Anemia in other chronic diseases classified elsewhere: Secondary | ICD-10-CM | POA: Insufficient documentation

## 2011-07-30 LAB — IRON AND TIBC
Iron: 52 ug/dL (ref 42–135)
TIBC: 174 ug/dL — ABNORMAL LOW (ref 250–470)

## 2011-07-30 MED ORDER — EPOETIN ALFA 10000 UNIT/ML IJ SOLN
INTRAMUSCULAR | Status: AC
  Administered 2011-07-30: 10000 [IU] via SUBCUTANEOUS
  Filled 2011-07-30: qty 1

## 2011-07-30 MED ORDER — EPOETIN ALFA 10000 UNIT/ML IJ SOLN
10000.0000 [IU] | INTRAMUSCULAR | Status: DC
  Administered 2011-07-30: 10000 [IU] via SUBCUTANEOUS

## 2011-08-18 DIAGNOSIS — I129 Hypertensive chronic kidney disease with stage 1 through stage 4 chronic kidney disease, or unspecified chronic kidney disease: Secondary | ICD-10-CM | POA: Diagnosis not present

## 2011-08-18 DIAGNOSIS — D649 Anemia, unspecified: Secondary | ICD-10-CM | POA: Diagnosis not present

## 2011-08-18 DIAGNOSIS — N2581 Secondary hyperparathyroidism of renal origin: Secondary | ICD-10-CM | POA: Diagnosis not present

## 2011-08-18 DIAGNOSIS — N183 Chronic kidney disease, stage 3 unspecified: Secondary | ICD-10-CM | POA: Diagnosis not present

## 2011-08-18 DIAGNOSIS — I10 Essential (primary) hypertension: Secondary | ICD-10-CM | POA: Diagnosis not present

## 2011-08-20 ENCOUNTER — Ambulatory Visit (INDEPENDENT_AMBULATORY_CARE_PROVIDER_SITE_OTHER): Payer: Medicare Other | Admitting: Endocrinology

## 2011-08-20 ENCOUNTER — Encounter: Payer: Self-pay | Admitting: Endocrinology

## 2011-08-20 VITALS — BP 108/72 | HR 71 | Temp 97.1°F | Ht 62.5 in | Wt 260.0 lb

## 2011-08-20 DIAGNOSIS — S239XXA Sprain of unspecified parts of thorax, initial encounter: Secondary | ICD-10-CM

## 2011-08-20 DIAGNOSIS — S29012A Strain of muscle and tendon of back wall of thorax, initial encounter: Secondary | ICD-10-CM

## 2011-08-20 MED ORDER — CYCLOBENZAPRINE HCL 10 MG PO TABS: 10.0000 mg | ORAL_TABLET | Freq: Three times a day (TID) | ORAL | Status: AC | PRN

## 2011-08-20 NOTE — Patient Instructions (Signed)
i have sent a prescription to your pharmacy, for a muscle relaxant I hope you feel better soon.  If you don't feel better by next week, please call back.
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
## 2011-08-27 ENCOUNTER — Encounter (HOSPITAL_COMMUNITY): Payer: Medicare Other

## 2011-08-28 ENCOUNTER — Encounter (HOSPITAL_COMMUNITY)
Admission: RE | Admit: 2011-08-28 | Discharge: 2011-08-28 | Disposition: A | Discharge status: Home / Self Care | Payer: Medicare Other | Source: Ambulatory Visit | Attending: Nephrology | Admitting: Nephrology

## 2011-08-28 DIAGNOSIS — N184 Chronic kidney disease, stage 4 (severe): Secondary | ICD-10-CM | POA: Insufficient documentation

## 2011-08-28 DIAGNOSIS — D638 Anemia in other chronic diseases classified elsewhere: Secondary | ICD-10-CM | POA: Diagnosis not present

## 2011-08-28 MED ORDER — EPOETIN ALFA 10000 UNIT/ML IJ SOLN
INTRAMUSCULAR | Status: AC
  Filled 2011-08-28: qty 1

## 2011-08-28 MED ORDER — EPOETIN ALFA 10000 UNIT/ML IJ SOLN
10000.0000 [IU] | INTRAMUSCULAR | Status: DC
  Administered 2011-08-28: 10000 [IU] via SUBCUTANEOUS

## 2011-08-29 LAB — IRON AND TIBC
Saturation Ratios: 27 % (ref 20–55)
UIBC: 150 ug/dL (ref 125–400)

## 2011-08-29 LAB — FERRITIN: Ferritin: 745 ng/mL — ABNORMAL HIGH (ref 10–291)

## 2011-09-24 ENCOUNTER — Other Ambulatory Visit (HOSPITAL_COMMUNITY): Payer: Self-pay | Admitting: *Deleted

## 2011-09-25 ENCOUNTER — Encounter (HOSPITAL_COMMUNITY): Payer: Medicare Other

## 2011-09-29 ENCOUNTER — Encounter (HOSPITAL_COMMUNITY)
Admission: RE | Admit: 2011-09-29 | Discharge: 2011-09-29 | Disposition: A | Discharge status: Home / Self Care | Payer: Medicare Other | Source: Ambulatory Visit | Attending: Nephrology | Admitting: Nephrology

## 2011-09-29 DIAGNOSIS — D638 Anemia in other chronic diseases classified elsewhere: Secondary | ICD-10-CM | POA: Insufficient documentation

## 2011-09-29 DIAGNOSIS — N184 Chronic kidney disease, stage 4 (severe): Secondary | ICD-10-CM | POA: Diagnosis not present

## 2011-09-29 MED ORDER — EPOETIN ALFA 10000 UNIT/ML IJ SOLN
INTRAMUSCULAR | Status: AC
  Administered 2011-09-29: 10000 [IU] via SUBCUTANEOUS
  Filled 2011-09-29: qty 1

## 2011-09-29 MED ORDER — EPOETIN ALFA 10000 UNIT/ML IJ SOLN
10000.0000 [IU] | INTRAMUSCULAR | Status: DC
  Administered 2011-09-29: 10000 [IU] via SUBCUTANEOUS

## 2011-09-30 LAB — POCT HEMOGLOBIN-HEMACUE: Hemoglobin: 11.4 g/dL — ABNORMAL LOW (ref 12.0–15.0)

## 2011-09-30 LAB — IRON AND TIBC
Iron: 51 ug/dL (ref 42–135)
TIBC: 214 ug/dL — ABNORMAL LOW (ref 250–470)

## 2011-10-16 ENCOUNTER — Other Ambulatory Visit: Payer: Self-pay | Admitting: Endocrinology

## 2011-10-27 ENCOUNTER — Encounter (HOSPITAL_COMMUNITY)
Admission: RE | Admit: 2011-10-27 | Discharge: 2011-10-27 | Disposition: A | Discharge status: Home / Self Care | Payer: Medicare Other | Source: Ambulatory Visit | Attending: Nephrology | Admitting: Nephrology

## 2011-10-27 DIAGNOSIS — D638 Anemia in other chronic diseases classified elsewhere: Secondary | ICD-10-CM | POA: Diagnosis not present

## 2011-10-27 DIAGNOSIS — N184 Chronic kidney disease, stage 4 (severe): Secondary | ICD-10-CM | POA: Insufficient documentation

## 2011-10-27 LAB — POCT HEMOGLOBIN-HEMACUE: Hemoglobin: 11.5 g/dL — ABNORMAL LOW (ref 12.0–15.0)

## 2011-10-27 MED ORDER — EPOETIN ALFA 10000 UNIT/ML IJ SOLN
INTRAMUSCULAR | Status: AC
  Filled 2011-10-27: qty 1

## 2011-10-27 MED ORDER — EPOETIN ALFA 10000 UNIT/ML IJ SOLN
10000.0000 [IU] | INTRAMUSCULAR | Status: DC
  Administered 2011-10-27: 10000 [IU] via SUBCUTANEOUS

## 2011-10-28 LAB — IRON AND TIBC
Saturation Ratios: 32 % (ref 20–55)
UIBC: 140 ug/dL (ref 125–400)

## 2011-11-26 ENCOUNTER — Encounter (HOSPITAL_COMMUNITY)
Admission: RE | Admit: 2011-11-26 | Discharge: 2011-11-26 | Disposition: A | Discharge status: Home / Self Care | Payer: Medicare Other | Source: Ambulatory Visit | Attending: Nephrology | Admitting: Nephrology

## 2011-11-26 DIAGNOSIS — N184 Chronic kidney disease, stage 4 (severe): Secondary | ICD-10-CM | POA: Diagnosis not present

## 2011-11-26 DIAGNOSIS — D638 Anemia in other chronic diseases classified elsewhere: Secondary | ICD-10-CM | POA: Diagnosis not present

## 2011-11-26 LAB — FERRITIN: Ferritin: 813 ng/mL — ABNORMAL HIGH (ref 10–291)

## 2011-11-26 LAB — IRON AND TIBC: Iron: 72 ug/dL (ref 42–135)

## 2011-11-26 MED ORDER — EPOETIN ALFA 10000 UNIT/ML IJ SOLN
10000.0000 [IU] | INTRAMUSCULAR | Status: DC
  Administered 2011-11-26: 10000 [IU] via SUBCUTANEOUS
  Filled 2011-11-26: qty 1

## 2011-11-30 ENCOUNTER — Telehealth: Payer: Self-pay | Admitting: Endocrinology

## 2011-11-30 MED ORDER — LOSARTAN POTASSIUM-HCTZ 100-25 MG PO TABS: 1.0000 | ORAL_TABLET | Freq: Every day | ORAL | Status: DC

## 2011-11-30 NOTE — Telephone Encounter (Signed)
Notified pt rx sent to [facility]... 11/30/11@11 :23am/LMB

## 2011-11-30 NOTE — Telephone Encounter (Signed)
The pt called the triage line stating she was completely out of her blood pressured medicine (Losartan-hydrocholorothiazide 100-25mg ).  She is hoping a rx can be called into the [name] on [facility] [address] [address] [facility] so she can pick up the rx today.

## 2011-12-14 ENCOUNTER — Other Ambulatory Visit: Payer: Self-pay | Admitting: Endocrinology

## 2011-12-14 ENCOUNTER — Other Ambulatory Visit: Payer: Self-pay

## 2011-12-14 MED ORDER — FUROSEMIDE 20 MG PO TABS: ORAL_TABLET | ORAL | Status: DC

## 2011-12-17 ENCOUNTER — Other Ambulatory Visit (HOSPITAL_COMMUNITY): Payer: Self-pay | Admitting: *Deleted

## 2011-12-24 ENCOUNTER — Encounter (HOSPITAL_COMMUNITY): Payer: Medicare Other

## 2011-12-25 ENCOUNTER — Other Ambulatory Visit: Payer: Self-pay | Admitting: *Deleted

## 2011-12-25 MED ORDER — ATORVASTATIN CALCIUM 80 MG PO TABS: 80.0000 mg | ORAL_TABLET | Freq: Every day | ORAL | Status: DC

## 2012-01-01 ENCOUNTER — Encounter (HOSPITAL_COMMUNITY)
Admission: RE | Admit: 2012-01-01 | Discharge: 2012-01-01 | Disposition: A | Discharge status: Home / Self Care | Payer: Medicare Other | Source: Ambulatory Visit | Attending: Nephrology | Admitting: Nephrology

## 2012-01-01 DIAGNOSIS — D638 Anemia in other chronic diseases classified elsewhere: Secondary | ICD-10-CM | POA: Diagnosis not present

## 2012-01-01 DIAGNOSIS — N184 Chronic kidney disease, stage 4 (severe): Secondary | ICD-10-CM | POA: Diagnosis not present

## 2012-01-01 MED ORDER — EPOETIN ALFA 10000 UNIT/ML IJ SOLN
10000.0000 [IU] | INTRAMUSCULAR | Status: DC
  Administered 2012-01-01: 10000 [IU] via SUBCUTANEOUS
  Filled 2012-01-01: qty 1

## 2012-01-02 LAB — IRON AND TIBC: Iron: 65 ug/dL (ref 42–135)

## 2012-01-02 LAB — FERRITIN: Ferritin: 787 ng/mL — ABNORMAL HIGH (ref 10–291)

## 2012-01-14 ENCOUNTER — Encounter: Payer: Medicare Other | Admitting: Endocrinology

## 2012-01-20 ENCOUNTER — Encounter: Payer: Medicare Other | Admitting: Endocrinology

## 2012-01-27 ENCOUNTER — Encounter: Payer: Self-pay | Admitting: Endocrinology

## 2012-01-27 ENCOUNTER — Ambulatory Visit (INDEPENDENT_AMBULATORY_CARE_PROVIDER_SITE_OTHER): Payer: Medicare Other | Admitting: Endocrinology

## 2012-01-27 VITALS — BP 132/76 | Temp 98.4°F | Wt 256.0 lb

## 2012-01-27 DIAGNOSIS — Z119 Encounter for screening for infectious and parasitic diseases, unspecified: Secondary | ICD-10-CM | POA: Diagnosis not present

## 2012-01-27 DIAGNOSIS — Z23 Encounter for immunization: Secondary | ICD-10-CM

## 2012-01-27 DIAGNOSIS — E119 Type 2 diabetes mellitus without complications: Secondary | ICD-10-CM

## 2012-01-27 DIAGNOSIS — Z0000 Encounter for general adult medical examination without abnormal findings: Secondary | ICD-10-CM | POA: Diagnosis not present

## 2012-01-27 DIAGNOSIS — M109 Gout, unspecified: Secondary | ICD-10-CM | POA: Diagnosis not present

## 2012-01-27 DIAGNOSIS — D509 Iron deficiency anemia, unspecified: Secondary | ICD-10-CM

## 2012-01-27 DIAGNOSIS — E039 Hypothyroidism, unspecified: Secondary | ICD-10-CM | POA: Diagnosis not present

## 2012-01-27 DIAGNOSIS — N2581 Secondary hyperparathyroidism of renal origin: Secondary | ICD-10-CM

## 2012-01-27 DIAGNOSIS — Z79899 Other long term (current) drug therapy: Secondary | ICD-10-CM | POA: Diagnosis not present

## 2012-01-27 DIAGNOSIS — E1129 Type 2 diabetes mellitus with other diabetic kidney complication: Secondary | ICD-10-CM

## 2012-01-27 DIAGNOSIS — I10 Essential (primary) hypertension: Secondary | ICD-10-CM

## 2012-01-27 DIAGNOSIS — E785 Hyperlipidemia, unspecified: Secondary | ICD-10-CM | POA: Diagnosis not present

## 2012-01-27 DIAGNOSIS — N189 Chronic kidney disease, unspecified: Secondary | ICD-10-CM | POA: Diagnosis not present

## 2012-01-27 MED ORDER — ATORVASTATIN CALCIUM 80 MG PO TABS: 80.0000 mg | ORAL_TABLET | Freq: Every day | ORAL | Status: DC

## 2012-01-27 NOTE — Patient Instructions (Addendum)
blood tests are being requested for you today.  You will be contacted with results. please consider these measures for your health:  minimize alcohol.  do not use tobacco products.  have a colonoscopy at least every 10 years from age 76.  Women should have an annual mammogram from age 29.  keep firearms safely stored.  always use seat belts.  have working smoke alarms in your home.  see an eye doctor and dentist regularly.  never drive under the influence of alcohol or drugs (including prescription drugs).   please let me know what your wishes would be, if artificial life support measures should become necessary.  it is critically important to prevent falling down (keep floor areas well-lit, dry, and free of loose objects.  If you have a cane, walker, or wheelchair, you should use it, even for short trips around the house.  Also, try not to rush).   good diet and exercise habits significanly improve the control of your diabetes.  please let me know if you wish to be referred to a dietician.  high blood sugar is very risky to your health.  you should see an eye doctor every year.  You are at higher than average risk for pneumonia and hepatitis-B.  You should be vaccinated against both.   Please come back for a follow-up appointment in 6 months You should have a vaccine against shingles (a painful rash which results from the  chickenpox infection which most people had many years ago).  This vaccine reduces, but does not totally eliminate the risk of shingles.  Because this is a medicare part d benefit, you should get it at a pharmacy.   (update: we discussed code status.  pt requests full code, but would not want to be started or maintained on artificial life-support measures if there was not a reasonable chance of recovery)

## 2012-01-27 NOTE — Progress Notes (Signed)
Subjective:    Patient ID: Stacy Walls, female    DOB: January 14, 1933, 78 y.o.   MRN: [id number]  HPI The state of at least three ongoing medical problems is addressed today: Renal insuff: Denies sob Hypothyroidism: she has lost a few lbs, due to her efforts. Gout: Denies foot pain Past Medical History  Diagnosis Date  . Chest pain   . Clostridium difficile colitis   . Coronary atherosclerosis of native coronary vessel   . Chronic renal insufficiency   . HLD (hyperlipidemia)   . DM2 (diabetes mellitus, type 2)   . HTN (hypertension)   . Acute cholecystitis   . Dyshidrosis   . Routine general medical examination at a health care facility   . Secondary hyperparathyroidism (of renal origin)   . Hypothyroidism   . Dizziness   . Cough   . Neck pain   . Neck mass   . GERD (gastroesophageal reflux disease)   . Urinary tract infection   . Urinary incontinence   . Gout   . Osteoporosis   . Allergic rhinitis   . Anemia   . Diabetes mellitus     Past Surgical History  Procedure Date  . C-section (other) 1977  . L ankle surgery 1988  . Electrocardiogram 02/01/06  . Bone density 08/21/05  . Cholecystectomy   . Total left knee 2007    History   Social History  . Marital Status: Married    Spouse Name: N/A    Number of Children: N/A  . Years of Education: N/A   Occupational History  . Not on file.   Social History Main Topics  . Smoking status: Never Smoker   . Smokeless tobacco: Not on file  . Alcohol Use: No  . Drug Use: No  . Sexually Active:    Other Topics Concern  . Not on file   Social History Narrative   Lives with husband and has lived in [place] for over 30 years.     Current Outpatient Prescriptions on File Prior to Visit  Medication Sig Dispense Refill  . allopurinol (ZYLOPRIM) 300 MG tablet TAKE 1 TABLET DAILY  90 tablet  3  . aspirin (ECOTRIN LOW STRENGTH) 81 MG EC tablet Take 81 mg by mouth daily.        Marland Kitchen atorvastatin (LIPITOR) 80 MG  tablet Take 1 tablet (80 mg total) by mouth daily.  90 tablet  3  . bromocriptine (PARLODEL) 2.5 MG tablet Take 1 tablet (2.5 mg total) by mouth at bedtime.  30 tablet  11  . epoetin alfa (PROCRIT) 16109 UNIT/ML injection Inject 10,000 Units into the skin every 30 (thirty) days. Dr. Justin Mend      . Ferrous Sulfate (IRON) 325 (65 FE) MG TABS Take 1 tablet by mouth daily.       . furosemide (LASIX) 20 MG tablet TAKE 1 TABLET ONCE DAILY  90 tablet  1  . JANUVIA 100 MG tablet TAKE  1/2 TABLET BY MOUTH EVERY DAY  15 tablet  5  . losartan-hydrochlorothiazide (HYZAAR) 100-25 MG per tablet Take 1 tablet by mouth daily.  90 tablet  3  . omeprazole (PRILOSEC) 20 MG capsule TAKE 1 CAPSULE DAILY  90 capsule  2  . oxybutynin (DITROPAN) 5 MG tablet TAKE 1 TABLET DAILY  90 tablet  3  . triamcinolone ointment (KENALOG) 0.1 % Apply topically 3 (three) times daily. Prn itching  454 g  1    Allergies  Allergen Reactions  . Azithromycin   .  Pioglitazone     REACTION: edema  . Sulfonamide Derivatives     REACTION: rash  . Tramadol     dizziness    Family History  Problem Relation Age of Onset  . Coronary artery disease Father   . Stroke Father   . Diabetes Father   . Diabetes Sister   . Stroke Sister   . Diabetes Brother     BP 132/76  Temp 98.4 F (36.9 C) (Oral)  Wt 256 lb (116.121 kg)   Review of Systems Denies dysuria and chest pain.     Objective:   Physical Exam VITAL SIGNS:  See vs page GENERAL: no distress Pulses: dorsalis pedis intact bilat.   Feet: no deformity.  no ulcer on the feet.  feet are of normal color and temp.  no edema.   Neuro: sensation is intact to touch on the feet.  Lab Results  Component Value Date   WBC 6.7 01/27/2012   HGB 11.6* 01/27/2012   HCT 36.4 01/27/2012   PLT 163 01/27/2012   GLUCOSE 151* 05/26/2011   CHOL 188 01/27/2012   TRIG 140 01/27/2012   HDL 44 01/27/2012   LDLDIRECT 108.6 11/10/2010   LDLCALC 116* 01/27/2012   ALT 11 01/27/2012   AST 14 01/27/2012    NA 135 05/26/2011   K 4.2 05/26/2011   CL 98 05/26/2011   CREATININE 2.00* 05/26/2011   BUN 34* 05/26/2011   CO2 29 05/26/2011   TSH 7.422* 01/27/2012   HGBA1C 6.8* 01/27/2012   MICROALBUR 0.2 01/12/2011      Assessment & Plan:  Hypothyroidism, mild, no rx needed now Gout, well-controlled Renal insuff, stable   Subjective:   Patient here for Medicare annual wellness visit and management of other chronic and acute problems.     Risk factors: advanced age    67 of Physicians Providing Medical Care to Patient:  See "snapshot"   Activities of Daily Living: In your present state of health, do you have any difficulty performing the following activities?:  Preparing food and eating?: No  Bathing yourself: No  Getting dressed: No  Using the toilet:No  Moving around from place to place: No  In the past year have you fallen or had a near fall?: No    Home Safety: Has smoke detector and wears seat belts. No firearms.  Diet and Exercise  Current exercise habits: pt says good Dietary issues discussed: pt reports a healthy diet   Depression Screen  Q1: Over the past two weeks, have you felt down, depressed or hopeless?no  Q2: Over the past two weeks, have you felt little interest or pleasure in doing things? no   The following portions of the patient's history were reviewed and updated as appropriate: allergies, current medications, past family history, past medical history, past social history, past surgical history and problem list.  Past Medical History  Diagnosis Date  . Chest pain   . Clostridium difficile colitis   . Coronary atherosclerosis of native coronary vessel   . Chronic renal insufficiency   . HLD (hyperlipidemia)   . DM2 (diabetes mellitus, type 2)   . HTN (hypertension)   . Acute cholecystitis   . Dyshidrosis   . Routine general medical examination at a health care facility   . Secondary hyperparathyroidism (of renal origin)   . Hypothyroidism   . Dizziness   .  Cough   . Neck pain   . Neck mass   . GERD (gastroesophageal reflux disease)   .  Urinary tract infection   . Urinary incontinence   . Gout   . Osteoporosis   . Allergic rhinitis   . Anemia   . Diabetes mellitus     Past Surgical History  Procedure Date  . C-section (other) 1977  . L ankle surgery 1988  . Electrocardiogram 02/01/06  . Bone density 08/21/05  . Cholecystectomy   . Total left knee 2007    History   Social History  . Marital Status: Married    Spouse Name: N/A    Number of Children: N/A  . Years of Education: N/A   Occupational History  . Not on file.   Social History Main Topics  . Smoking status: Never Smoker   . Smokeless tobacco: Not on file  . Alcohol Use: No  . Drug Use: No  . Sexually Active:    Other Topics Concern  . Not on file   Social History Narrative   Lives with husband and has lived in [place] for over 30 years.     Current Outpatient Prescriptions on File Prior to Visit  Medication Sig Dispense Refill  . allopurinol (ZYLOPRIM) 300 MG tablet TAKE 1 TABLET DAILY  90 tablet  3  . aspirin (ECOTRIN LOW STRENGTH) 81 MG EC tablet Take 81 mg by mouth daily.        . bromocriptine (PARLODEL) 2.5 MG tablet Take 1 tablet (2.5 mg total) by mouth at bedtime.  30 tablet  11  . epoetin alfa (PROCRIT) 96295 UNIT/ML injection Inject 10,000 Units into the skin every 30 (thirty) days. Dr. Justin Mend      . Ferrous Sulfate (IRON) 325 (65 FE) MG TABS Take 1 tablet by mouth daily.       . furosemide (LASIX) 20 MG tablet TAKE 1 TABLET ONCE DAILY  90 tablet  1  . JANUVIA 100 MG tablet TAKE  1/2 TABLET BY MOUTH EVERY DAY  15 tablet  5  . losartan-hydrochlorothiazide (HYZAAR) 100-25 MG per tablet Take 1 tablet by mouth daily.  90 tablet  3  . omeprazole (PRILOSEC) 20 MG capsule TAKE 1 CAPSULE DAILY  90 capsule  2  . oxybutynin (DITROPAN) 5 MG tablet TAKE 1 TABLET DAILY  90 tablet  3  . triamcinolone ointment (KENALOG) 0.1 % Apply topically 3 (three) times  daily. Prn itching  454 g  1  . DISCONTD: atorvastatin (LIPITOR) 80 MG tablet Take 1 tablet (80 mg total) by mouth daily.  30 tablet  5    Allergies  Allergen Reactions  . Azithromycin   . Pioglitazone     REACTION: edema  . Sulfonamide Derivatives     REACTION: rash  . Tramadol     dizziness    Family History  Problem Relation Age of Onset  . Coronary artery disease Father   . Stroke Father   . Diabetes Father   . Diabetes Sister   . Stroke Sister   . Diabetes Brother     BP 132/76  Temp 98.4 F (36.9 C) (Oral)  Wt 256 lb (116.121 kg)   Review of Systems  Denies hearing loss, and visual loss Objective:   Vision:  Sees opthalmologist Hearing: grossly normal Body mass index:  See vs page Msk: pt easily and quickly performs "get-up-and-go" from a sitting position Cognitive Impairment Assessment: cognition, memory and judgment appear normal.  remembers 3/3 at 5 minutes.  excellent recall.  can easily read and write a sentence.  alert and oriented x 3  Assessment:   Medicare wellness utd on preventive parameters    Plan:   During the course of the visit the patient was educated and counseled about appropriate screening and preventive services including:       Fall prevention   Screening mammography  Bone densitometry screening  Diabetes screening  Nutrition counseling   Vaccines / LABS Zostavax   Patient Instructions (the written plan) was given to the patient.

## 2012-01-28 ENCOUNTER — Encounter (HOSPITAL_COMMUNITY)
Admission: RE | Admit: 2012-01-28 | Discharge: 2012-01-28 | Disposition: A | Discharge status: Home / Self Care | Payer: Medicare Other | Source: Ambulatory Visit | Attending: Nephrology | Admitting: Nephrology

## 2012-01-28 DIAGNOSIS — D638 Anemia in other chronic diseases classified elsewhere: Secondary | ICD-10-CM | POA: Insufficient documentation

## 2012-01-28 DIAGNOSIS — N184 Chronic kidney disease, stage 4 (severe): Secondary | ICD-10-CM | POA: Insufficient documentation

## 2012-01-28 LAB — HEPATIC FUNCTION PANEL
ALT: 11 U/L (ref 0–35)
Albumin: 3.7 g/dL (ref 3.5–5.2)
Bilirubin, Direct: 0.2 mg/dL (ref 0.0–0.3)
Indirect Bilirubin: 0.8 mg/dL (ref 0.0–0.9)
Total Bilirubin: 1 mg/dL (ref 0.3–1.2)
Total Protein: 6.3 g/dL (ref 6.0–8.3)

## 2012-01-28 LAB — CBC WITH DIFFERENTIAL/PLATELET
Basophils Absolute: 0 10*3/uL (ref 0.0–0.1)
Lymphocytes Relative: 28 % (ref 12–46)
Lymphs Abs: 1.9 10*3/uL (ref 0.7–4.0)
Neutro Abs: 4 10*3/uL (ref 1.7–7.7)
Platelets: 163 10*3/uL (ref 150–400)
RBC: 3.9 MIL/uL (ref 3.87–5.11)
RDW: 13.9 % (ref 11.5–15.5)
WBC: 6.7 10*3/uL (ref 4.0–10.5)

## 2012-01-28 LAB — IBC PANEL
%SAT: 27 % (ref 20–55)
TIBC: 217 ug/dL — ABNORMAL LOW (ref 250–470)
UIBC: 158 ug/dL (ref 125–400)

## 2012-01-28 LAB — HEMOGLOBIN A1C: Hgb A1c MFr Bld: 6.8 % — ABNORMAL HIGH (ref <5.7)

## 2012-01-28 LAB — LIPID PANEL
Total CHOL/HDL Ratio: 4.3 Ratio
VLDL: 28 mg/dL (ref 0–40)

## 2012-01-28 LAB — TSH: TSH: 7.422 u[IU]/mL — ABNORMAL HIGH (ref 0.350–4.500)

## 2012-01-28 LAB — URIC ACID: Uric Acid, Serum: 5.8 mg/dL (ref 2.4–7.0)

## 2012-01-28 MED ORDER — EPOETIN ALFA 10000 UNIT/ML IJ SOLN
INTRAMUSCULAR | Status: AC
  Administered 2012-01-28: 10000 [IU] via SUBCUTANEOUS
  Filled 2012-01-28: qty 1

## 2012-01-28 MED ORDER — EPOETIN ALFA 10000 UNIT/ML IJ SOLN
10000.0000 [IU] | INTRAMUSCULAR | Status: DC
  Administered 2012-01-28: 10000 [IU] via SUBCUTANEOUS

## 2012-01-29 ENCOUNTER — Telehealth: Payer: Self-pay

## 2012-01-29 NOTE — Telephone Encounter (Signed)
Per pt she will call and make appt for hep b vaccine. Vaccination per recent lab work, ok by [facility]

## 2012-02-09 ENCOUNTER — Other Ambulatory Visit: Payer: Self-pay | Admitting: Endocrinology

## 2012-02-09 MED ORDER — OMEPRAZOLE 20 MG PO CPDR: DELAYED_RELEASE_CAPSULE | ORAL | Status: DC

## 2012-02-09 NOTE — Telephone Encounter (Signed)
Med filled.  

## 2012-02-09 NOTE — Telephone Encounter (Signed)
Pt would like refill on Prilosec sent to [facility] @ [phone].

## 2012-02-10 ENCOUNTER — Other Ambulatory Visit: Payer: Self-pay | Admitting: Endocrinology

## 2012-02-10 MED ORDER — OMEPRAZOLE 20 MG PO CPDR: DELAYED_RELEASE_CAPSULE | ORAL | Status: DC

## 2012-02-10 NOTE — Telephone Encounter (Signed)
Pt reports the rx for Prilosec wasn't at her pharmacy. Rx was sent to mail order. It should have been sent somewhere else? Please check and advise patient.

## 2012-02-10 NOTE — Telephone Encounter (Signed)
Pt med filled at [facility].

## 2012-02-11 ENCOUNTER — Ambulatory Visit (INDEPENDENT_AMBULATORY_CARE_PROVIDER_SITE_OTHER): Payer: Medicare Other | Admitting: *Deleted

## 2012-02-11 DIAGNOSIS — Z23 Encounter for immunization: Secondary | ICD-10-CM

## 2012-02-24 ENCOUNTER — Telehealth: Payer: Self-pay | Admitting: Endocrinology

## 2012-02-24 NOTE — Telephone Encounter (Signed)
Pt has a sore throat and wants to know if Dr. Loanne Drilling will call in an rx for it?

## 2012-02-24 NOTE — Telephone Encounter (Signed)
Pt would like to know if Dr. Loanne Drilling will send her in an rx for a sore throat? She uses the  [facility] in chart. She would like know if this could have come from her recent injection?

## 2012-02-24 NOTE — Telephone Encounter (Signed)
Called and left msg for pt advising an ov for sore throat. To call back to schedule.

## 2012-02-24 NOTE — Telephone Encounter (Signed)
Please advise ov 

## 2012-02-25 ENCOUNTER — Ambulatory Visit (INDEPENDENT_AMBULATORY_CARE_PROVIDER_SITE_OTHER): Payer: Medicare Other | Admitting: Endocrinology

## 2012-02-25 ENCOUNTER — Encounter: Payer: Self-pay | Admitting: Endocrinology

## 2012-02-25 ENCOUNTER — Encounter (HOSPITAL_COMMUNITY): Payer: Medicare Other

## 2012-02-25 VITALS — BP 134/80 | HR 98 | Temp 98.7°F | Wt 256.0 lb

## 2012-02-25 DIAGNOSIS — J069 Acute upper respiratory infection, unspecified: Secondary | ICD-10-CM | POA: Diagnosis not present

## 2012-02-25 MED ORDER — BENZONATATE 200 MG PO CAPS: 200.0000 mg | ORAL_CAPSULE | Freq: Two times a day (BID) | ORAL | Status: DC | PRN

## 2012-02-25 MED ORDER — CEFUROXIME AXETIL 250 MG PO TABS: 250.0000 mg | ORAL_TABLET | Freq: Two times a day (BID) | ORAL | Status: DC

## 2012-02-25 NOTE — Patient Instructions (Addendum)
i have sent 2 prescriptions to your pharmacy: for an antibiotic pill, and for the cough I hope you feel better soon.  If you don't feel better by next week, please call back.

## 2012-02-25 NOTE — Progress Notes (Signed)
Subjective:    Patient ID: Stacy Walls, female    DOB: 10/07/1932, 78 y.o.   MRN: [id number]  HPI Pt states few days of moderate pain at the throat, and assoc slight nasal congestion.  She has a dry cough also Past Medical History  Diagnosis Date  . Chest pain   . Clostridium difficile colitis   . Coronary atherosclerosis of native coronary vessel   . Chronic renal insufficiency   . HLD (hyperlipidemia)   . DM2 (diabetes mellitus, type 2)   . HTN (hypertension)   . Acute cholecystitis   . Dyshidrosis   . Routine general medical examination at a health care facility   . Secondary hyperparathyroidism (of renal origin)   . Hypothyroidism   . Dizziness   . Cough   . Neck pain   . Neck mass   . GERD (gastroesophageal reflux disease)   . Urinary tract infection   . Urinary incontinence   . Gout   . Osteoporosis   . Allergic rhinitis   . Anemia   . Diabetes mellitus     Past Surgical History  Procedure Date  . C-section (other) 1977  . L ankle surgery 1988  . Electrocardiogram 02/01/06  . Bone density 08/21/05  . Cholecystectomy   . Total left knee 2007    History   Social History  . Marital Status: Married    Spouse Name: N/A    Number of Children: N/A  . Years of Education: N/A   Occupational History  . Not on file.   Social History Main Topics  . Smoking status: Never Smoker   . Smokeless tobacco: Not on file  . Alcohol Use: No  . Drug Use: No  . Sexually Active:    Other Topics Concern  . Not on file   Social History Narrative   Lives with husband and has lived in [place] for over 30 years.     Current Outpatient Prescriptions on File Prior to Visit  Medication Sig Dispense Refill  . allopurinol (ZYLOPRIM) 300 MG tablet TAKE 1 TABLET DAILY  90 tablet  3  . aspirin (ECOTRIN LOW STRENGTH) 81 MG EC tablet Take 81 mg by mouth daily.        Marland Kitchen atorvastatin (LIPITOR) 80 MG tablet Take 1 tablet (80 mg total) by mouth daily.  90 tablet  3  .  bromocriptine (PARLODEL) 2.5 MG tablet Take 1 tablet (2.5 mg total) by mouth at bedtime.  30 tablet  11  . epoetin alfa (PROCRIT) 36644 UNIT/ML injection Inject 10,000 Units into the skin every 30 (thirty) days. Dr. Justin Mend      . Ferrous Sulfate (IRON) 325 (65 FE) MG TABS Take 1 tablet by mouth daily.       . furosemide (LASIX) 20 MG tablet TAKE 1 TABLET ONCE DAILY  90 tablet  1  . JANUVIA 100 MG tablet TAKE  1/2 TABLET BY MOUTH EVERY DAY  15 tablet  5  . losartan-hydrochlorothiazide (HYZAAR) 100-25 MG per tablet Take 1 tablet by mouth daily.  90 tablet  3  . omeprazole (PRILOSEC) 20 MG capsule TAKE 1 CAPSULE DAILY  90 capsule  11  . oxybutynin (DITROPAN) 5 MG tablet TAKE 1 TABLET DAILY  90 tablet  3  . triamcinolone ointment (KENALOG) 0.1 % Apply topically 3 (three) times daily. Prn itching  454 g  1    Allergies  Allergen Reactions  . Azithromycin   . Pioglitazone     REACTION:  edema  . Sulfonamide Derivatives     REACTION: rash  . Tramadol     dizziness    Family History  Problem Relation Age of Onset  . Coronary artery disease Father   . Stroke Father   . Diabetes Father   . Diabetes Sister   . Stroke Sister   . Diabetes Brother     BP 134/80  Pulse 98  Temp 98.7 F (37.1 C) (Oral)  Wt 256 lb (116.121 kg)  SpO2 95%    Review of Systems Denies fever and earache.    Objective:   Physical Exam VITAL SIGNS:  See vs page GENERAL: no distress head: no deformity eyes: no periorbital swelling, no proptosis external nose and ears are normal mouth: no lesion seen Both eac's and tm's are normal.        Assessment & Plan:  URI, new

## 2012-02-26 ENCOUNTER — Telehealth: Payer: Self-pay | Admitting: Endocrinology

## 2012-02-26 MED ORDER — BENZONATATE 100 MG PO CAPS: 100.0000 mg | ORAL_CAPSULE | Freq: Two times a day (BID) | ORAL | Status: DC | PRN

## 2012-02-26 NOTE — Telephone Encounter (Signed)
Insurance will not cover cough medicine prescribed. Can we call in something else?

## 2012-02-26 NOTE — Telephone Encounter (Signed)
Called pt to let her know of the change. Pt understood.

## 2012-02-26 NOTE — Telephone Encounter (Signed)
Pt's insurance wouldn't cover cough medicine that Dr. Loanne Drilling prescribed yesterday. Can we call in something else?

## 2012-02-26 NOTE — Telephone Encounter (Signed)
i changed to 100 mg, which is cheap, even without insurance

## 2012-03-02 ENCOUNTER — Other Ambulatory Visit (HOSPITAL_COMMUNITY): Payer: Self-pay | Admitting: *Deleted

## 2012-03-03 ENCOUNTER — Encounter (HOSPITAL_COMMUNITY)
Admission: RE | Admit: 2012-03-03 | Discharge: 2012-03-03 | Disposition: A | Discharge status: Home / Self Care | Payer: Medicare Other | Source: Ambulatory Visit | Attending: Nephrology | Admitting: Nephrology

## 2012-03-03 DIAGNOSIS — N184 Chronic kidney disease, stage 4 (severe): Secondary | ICD-10-CM | POA: Diagnosis not present

## 2012-03-03 DIAGNOSIS — D638 Anemia in other chronic diseases classified elsewhere: Secondary | ICD-10-CM | POA: Insufficient documentation

## 2012-03-03 LAB — FERRITIN: Ferritin: 730 ng/mL — ABNORMAL HIGH (ref 10–291)

## 2012-03-03 MED ORDER — EPOETIN ALFA 10000 UNIT/ML IJ SOLN: 10000.0000 [IU] | INTRAMUSCULAR | Status: DC

## 2012-03-16 ENCOUNTER — Ambulatory Visit: Payer: Medicare Other

## 2012-03-18 ENCOUNTER — Encounter (HOSPITAL_COMMUNITY): Payer: Medicare Other

## 2012-03-23 ENCOUNTER — Encounter (HOSPITAL_COMMUNITY)
Admission: RE | Admit: 2012-03-23 | Discharge: 2012-03-23 | Disposition: A | Discharge status: Home / Self Care | Payer: Medicare Other | Source: Ambulatory Visit | Attending: Nephrology | Admitting: Nephrology

## 2012-03-23 DIAGNOSIS — N184 Chronic kidney disease, stage 4 (severe): Secondary | ICD-10-CM | POA: Insufficient documentation

## 2012-03-23 DIAGNOSIS — D638 Anemia in other chronic diseases classified elsewhere: Secondary | ICD-10-CM | POA: Insufficient documentation

## 2012-03-23 MED ORDER — EPOETIN ALFA 10000 UNIT/ML IJ SOLN
10000.0000 [IU] | INTRAMUSCULAR | Status: DC
  Administered 2012-03-23: 10000 [IU] via SUBCUTANEOUS

## 2012-03-23 MED ORDER — EPOETIN ALFA 10000 UNIT/ML IJ SOLN
INTRAMUSCULAR | Status: AC
  Filled 2012-03-23: qty 1

## 2012-03-24 ENCOUNTER — Ambulatory Visit: Payer: Medicare Other | Admitting: *Deleted

## 2012-03-24 DIAGNOSIS — Z23 Encounter for immunization: Secondary | ICD-10-CM

## 2012-03-24 MED ORDER — HEPATITIS B VAC RECOMBINANT 5 MCG/0.5ML IJ SUSP
0.5000 mL | Freq: Once | INTRAMUSCULAR | Status: AC
  Administered 2012-03-24: 5 ug via INTRAMUSCULAR

## 2012-04-21 ENCOUNTER — Encounter (HOSPITAL_COMMUNITY)
Admission: RE | Admit: 2012-04-21 | Discharge: 2012-04-21 | Disposition: A | Discharge status: Home / Self Care | Payer: Medicare Other | Source: Ambulatory Visit | Attending: Nephrology | Admitting: Nephrology

## 2012-04-21 DIAGNOSIS — N184 Chronic kidney disease, stage 4 (severe): Secondary | ICD-10-CM | POA: Diagnosis not present

## 2012-04-21 DIAGNOSIS — D638 Anemia in other chronic diseases classified elsewhere: Secondary | ICD-10-CM | POA: Insufficient documentation

## 2012-04-21 LAB — POCT HEMOGLOBIN-HEMACUE: Hemoglobin: 11.7 g/dL — ABNORMAL LOW (ref 12.0–15.0)

## 2012-04-21 LAB — IRON AND TIBC
Saturation Ratios: 36 % (ref 20–55)
TIBC: 195 ug/dL — ABNORMAL LOW (ref 250–470)
UIBC: 125 ug/dL (ref 125–400)

## 2012-04-21 LAB — FERRITIN: Ferritin: 901 ng/mL — ABNORMAL HIGH (ref 10–291)

## 2012-04-21 MED ORDER — EPOETIN ALFA 10000 UNIT/ML IJ SOLN
INTRAMUSCULAR | Status: AC
  Administered 2012-04-21: 10000 [IU] via SUBCUTANEOUS
  Filled 2012-04-21: qty 1

## 2012-04-21 MED ORDER — EPOETIN ALFA 10000 UNIT/ML IJ SOLN: 10000.0000 [IU] | INTRAMUSCULAR | Status: DC

## 2012-05-11 ENCOUNTER — Other Ambulatory Visit: Payer: Self-pay

## 2012-05-11 ENCOUNTER — Telehealth: Payer: Self-pay

## 2012-05-11 MED ORDER — SITAGLIPTIN PHOSPHATE 100 MG PO TABS: 50.0000 mg | ORAL_TABLET | Freq: Every day | ORAL | Status: DC

## 2012-05-11 NOTE — Telephone Encounter (Signed)
Pt wanting to know if we have samples of onglyza.  She was given some at last visit, if not please seed to [facility].  She has 2 pills left.

## 2012-05-11 NOTE — Telephone Encounter (Signed)
Did not see Onglyza on pt's med list ok to fill?

## 2012-05-11 NOTE — Telephone Encounter (Signed)
rx sent

## 2012-05-19 ENCOUNTER — Encounter (HOSPITAL_COMMUNITY): Payer: Medicare Other

## 2012-05-24 ENCOUNTER — Ambulatory Visit: Payer: Medicare Other | Admitting: Cardiology

## 2012-05-25 ENCOUNTER — Encounter (HOSPITAL_COMMUNITY)
Admission: RE | Admit: 2012-05-25 | Discharge: 2012-05-25 | Disposition: A | Discharge status: Home / Self Care | Payer: Medicare Other | Source: Ambulatory Visit | Attending: Nephrology | Admitting: Nephrology

## 2012-05-25 DIAGNOSIS — D638 Anemia in other chronic diseases classified elsewhere: Secondary | ICD-10-CM | POA: Insufficient documentation

## 2012-05-25 DIAGNOSIS — N184 Chronic kidney disease, stage 4 (severe): Secondary | ICD-10-CM | POA: Insufficient documentation

## 2012-05-25 LAB — IRON AND TIBC
TIBC: 196 ug/dL — ABNORMAL LOW (ref 250–470)
UIBC: 135 ug/dL (ref 125–400)

## 2012-05-25 MED ORDER — EPOETIN ALFA 10000 UNIT/ML IJ SOLN
10000.0000 [IU] | INTRAMUSCULAR | Status: DC
  Administered 2012-05-25: 10000 [IU] via SUBCUTANEOUS

## 2012-05-25 MED ORDER — EPOETIN ALFA 10000 UNIT/ML IJ SOLN
INTRAMUSCULAR | Status: AC
  Filled 2012-05-25: qty 1

## 2012-05-26 LAB — FERRITIN: Ferritin: 856 ng/mL — ABNORMAL HIGH (ref 10–291)

## 2012-06-22 ENCOUNTER — Other Ambulatory Visit (HOSPITAL_COMMUNITY): Payer: Self-pay | Admitting: *Deleted

## 2012-06-23 ENCOUNTER — Other Ambulatory Visit: Payer: Self-pay | Admitting: Endocrinology

## 2012-06-23 ENCOUNTER — Encounter (HOSPITAL_COMMUNITY)
Admission: RE | Admit: 2012-06-23 | Discharge: 2012-06-23 | Disposition: A | Discharge status: Home / Self Care | Payer: Medicare Other | Source: Ambulatory Visit | Attending: Nephrology | Admitting: Nephrology

## 2012-06-23 DIAGNOSIS — D638 Anemia in other chronic diseases classified elsewhere: Secondary | ICD-10-CM | POA: Diagnosis not present

## 2012-06-23 DIAGNOSIS — N184 Chronic kidney disease, stage 4 (severe): Secondary | ICD-10-CM | POA: Diagnosis not present

## 2012-06-23 LAB — POCT HEMOGLOBIN-HEMACUE: Hemoglobin: 11.3 g/dL — ABNORMAL LOW (ref 12.0–15.0)

## 2012-06-23 LAB — IRON AND TIBC
Saturation Ratios: 48 % (ref 20–55)
TIBC: 230 ug/dL — ABNORMAL LOW (ref 250–470)

## 2012-06-23 MED ORDER — EPOETIN ALFA 10000 UNIT/ML IJ SOLN
INTRAMUSCULAR | Status: AC
  Administered 2012-06-23: 10000 [IU] via SUBCUTANEOUS
  Filled 2012-06-23: qty 1

## 2012-06-23 MED ORDER — EPOETIN ALFA 10000 UNIT/ML IJ SOLN: 10000.0000 [IU] | INTRAMUSCULAR | Status: DC

## 2012-07-15 ENCOUNTER — Telehealth: Payer: Self-pay | Admitting: Cardiology

## 2012-07-15 NOTE — Telephone Encounter (Signed)
New Problem:    Patient called in wanting to reschedule her appointment.  Please call back.

## 2012-07-15 NOTE — Telephone Encounter (Signed)
I spoke with pt about rescheduling her appointment. She felt later in April would be better. Appointment made for 08/09/12 2pm with Dr. Verl Blalock . She did not want to see a PA at this time She is not having any cardiac complaints at this time  Horton Chin RN

## 2012-07-18 ENCOUNTER — Ambulatory Visit: Payer: Medicare Other | Admitting: Cardiology

## 2012-07-25 ENCOUNTER — Encounter (HOSPITAL_COMMUNITY): Payer: Medicare Other

## 2012-07-27 ENCOUNTER — Ambulatory Visit: Payer: Medicare Other | Admitting: Endocrinology

## 2012-07-28 ENCOUNTER — Ambulatory Visit: Payer: Medicare Other | Admitting: Internal Medicine

## 2012-07-28 ENCOUNTER — Encounter (HOSPITAL_COMMUNITY)
Admission: RE | Admit: 2012-07-28 | Discharge: 2012-07-28 | Disposition: A | Discharge status: Home / Self Care | Payer: Medicare Other | Source: Ambulatory Visit | Attending: Nephrology | Admitting: Nephrology

## 2012-07-28 ENCOUNTER — Ambulatory Visit (INDEPENDENT_AMBULATORY_CARE_PROVIDER_SITE_OTHER): Payer: Medicare Other | Admitting: Internal Medicine

## 2012-07-28 ENCOUNTER — Encounter: Payer: Self-pay | Admitting: Internal Medicine

## 2012-07-28 VITALS — BP 116/54 | HR 69 | Temp 98.2°F | Ht 62.5 in | Wt 249.0 lb

## 2012-07-28 DIAGNOSIS — N184 Chronic kidney disease, stage 4 (severe): Secondary | ICD-10-CM | POA: Insufficient documentation

## 2012-07-28 DIAGNOSIS — R112 Nausea with vomiting, unspecified: Secondary | ICD-10-CM | POA: Diagnosis not present

## 2012-07-28 DIAGNOSIS — D638 Anemia in other chronic diseases classified elsewhere: Secondary | ICD-10-CM | POA: Insufficient documentation

## 2012-07-28 DIAGNOSIS — K5289 Other specified noninfective gastroenteritis and colitis: Secondary | ICD-10-CM

## 2012-07-28 DIAGNOSIS — K529 Noninfective gastroenteritis and colitis, unspecified: Secondary | ICD-10-CM

## 2012-07-28 LAB — POCT HEMOGLOBIN-HEMACUE: Hemoglobin: 11.8 g/dL — ABNORMAL LOW (ref 12.0–15.0)

## 2012-07-28 LAB — IRON AND TIBC
Saturation Ratios: 47 % (ref 20–55)
UIBC: 103 ug/dL — ABNORMAL LOW (ref 125–400)

## 2012-07-28 MED ORDER — EPOETIN ALFA 10000 UNIT/ML IJ SOLN: 10000.0000 [IU] | INTRAMUSCULAR | Status: DC

## 2012-07-28 MED ORDER — PROMETHAZINE HCL 25 MG/ML IJ SOLN
25.0000 mg | Freq: Once | INTRAMUSCULAR | Status: AC
  Administered 2012-07-28: 25 mg via INTRAMUSCULAR

## 2012-07-28 MED ORDER — EPOETIN ALFA 10000 UNIT/ML IJ SOLN
INTRAMUSCULAR | Status: AC
  Administered 2012-07-28: 10000 [IU] via SUBCUTANEOUS
  Filled 2012-07-28: qty 1

## 2012-07-28 NOTE — Addendum Note (Signed)
Addended by: Legrand Como on: 07/28/2012 04:14 PM   Modules accepted: Orders

## 2012-07-28 NOTE — Progress Notes (Signed)
Subjective:    Patient ID: Stacy Walls, female    DOB: 26-Feb-1933, 79 y.o.   MRN: [id number]  HPI  Pt presents to the clinic today with c/o nausea, vomiting and diarrhea. This started about 3 days ago. She caught a virus from her husband who had similar symptoms. Her daughter and her kids have also had the same thing. The vomiting and diarrhea has subsided but she still feels nauseated. She has taken some Zofran. She is able to keep fluids down. Her mouth does not feel dry. She is urinating at least ever y12 hours. She does feel really fatigued but she is not sure if this is due to the loss of her sister this past weekend.  Review of Systems      Past Medical History  Diagnosis Date  . Chest pain   . Clostridium difficile colitis   . Coronary atherosclerosis of native coronary vessel   . Chronic renal insufficiency   . HLD (hyperlipidemia)   . DM2 (diabetes mellitus, type 2)   . HTN (hypertension)   . Acute cholecystitis   . Dyshidrosis   . Routine general medical examination at a health care facility   . Secondary hyperparathyroidism (of renal origin)   . Hypothyroidism   . Dizziness   . Cough   . Neck pain   . Neck mass   . GERD (gastroesophageal reflux disease)   . Urinary tract infection   . Urinary incontinence   . Gout   . Osteoporosis   . Allergic rhinitis   . Anemia   . Diabetes mellitus     Current Outpatient Prescriptions  Medication Sig Dispense Refill  . allopurinol (ZYLOPRIM) 300 MG tablet TAKE 1 TABLET DAILY  90 tablet  0  . aspirin (ECOTRIN LOW STRENGTH) 81 MG EC tablet Take 81 mg by mouth daily.        Marland Kitchen atorvastatin (LIPITOR) 80 MG tablet Take 1 tablet (80 mg total) by mouth daily.  90 tablet  3  . benzonatate (TESSALON) 100 MG capsule Take 1 capsule (100 mg total) by mouth 2 (two) times daily as needed for cough.  20 capsule  0  . bromocriptine (PARLODEL) 2.5 MG tablet Take 1 tablet (2.5 mg total) by mouth at bedtime.  30 tablet  11  . epoetin  alfa (PROCRIT) 60454 UNIT/ML injection Inject 10,000 Units into the skin every 30 (thirty) days. Dr. Justin Mend      . Ferrous Sulfate (IRON) 325 (65 FE) MG TABS Take 1 tablet by mouth daily.       . furosemide (LASIX) 20 MG tablet TAKE 1 TABLET ONCE DAILY  90 tablet  1  . losartan-hydrochlorothiazide (HYZAAR) 100-25 MG per tablet Take 1 tablet by mouth daily.  90 tablet  3  . omeprazole (PRILOSEC) 20 MG capsule TAKE 1 CAPSULE DAILY  90 capsule  11  . oxybutynin (DITROPAN) 5 MG tablet TAKE 1 TABLET DAILY  90 tablet  3  . oxybutynin (DITROPAN) 5 MG tablet TAKE 1 TABLET DAILY  90 tablet  0  . sitaGLIPtin (JANUVIA) 100 MG tablet Take 0.5 tablets (50 mg total) by mouth daily.  15 tablet  11  . triamcinolone ointment (KENALOG) 0.1 % Apply topically 3 (three) times daily. Prn itching  454 g  1   No current facility-administered medications for this visit.   Facility-Administered Medications Ordered in Other Visits  Medication Dose Route Frequency Provider Last Rate Last Dose  . epoetin alfa (EPOGEN,PROCRIT) injection 10,000 Units  10,000 Units Subcutaneous Q28 days Sherril Croon, MD        Allergies  Allergen Reactions  . Azithromycin   . Pioglitazone     REACTION: edema  . Sulfonamide Derivatives     REACTION: rash  . Tramadol     dizziness    Family History  Problem Relation Age of Onset  . Coronary artery disease Father   . Stroke Father   . Diabetes Father   . Diabetes Sister   . Stroke Sister   . Diabetes Brother     History   Social History  . Marital Status: Married    Spouse Name: N/A    Number of Children: N/A  . Years of Education: N/A   Occupational History  . Not on file.   Social History Main Topics  . Smoking status: Never Smoker   . Smokeless tobacco: Not on file  . Alcohol Use: No  . Drug Use: No  . Sexually Active: Not on file   Other Topics Concern  . Not on file   Social History Narrative   Lives with husband and has lived in [place] for over 30  years.      Constitutional: Pt reports fatigue. Denies fever, malaise, headache or abrupt weight changes.  Gastrointestinal: Pt reports nausea, vomiting and diarrhea. Denies abdominal pain, bloating, constipation, or blood in the stool.  Neurological: Denies dizziness, difficulty with memory, difficulty with speech or problems with balance and coordination.   No other specific complaints in a complete review of systems (except as listed in HPI above).  Objective:   Physical Exam   BP 116/54  Pulse 69  Temp(Src) 98.2 F (36.8 C) (Oral)  Ht 5' 2.5" (1.588 m)  Wt 249 lb (112.946 kg)  BMI 44.79 kg/m2  SpO2 93% Wt Readings from Last 3 Encounters:  07/28/12 249 lb (112.946 kg)  02/25/12 256 lb (116.121 kg)  01/27/12 256 lb (116.121 kg)    General: Appears her stated age, obese but well developed, well nourished in NAD. Cardiovascular: Normal rate and rhythm. S1,S2 noted.  No murmur, rubs or gallops noted. No JVD or BLE edema. No carotid bruits noted. Pulmonary/Chest: Normal effort and positive vesicular breath sounds. No respiratory distress. No wheezes, rales or ronchi noted.  Abdomen: Soft and nontender. Normal bowel sounds, no bruits noted. No distention or masses noted. Liver, spleen and kidneys non palpable. Neurological: Alert and oriented. Cranial nerves II-XII intact. Coordination normal. +DTRs bilaterally.       Assessment & Plan:   Gastroenteritis, new onset:  Get some rest and drink plenty of fluids 25 mg Phenergan IM now Wash hands well and wipe the bathrooms down with lysol  RTC as neded or if symptoms persist

## 2012-07-28 NOTE — Patient Instructions (Addendum)
Viral Gastroenteritis Viral gastroenteritis is also known as stomach flu. This condition affects the stomach and intestinal tract. It can cause sudden diarrhea and vomiting. The illness typically lasts 3 to 8 days. Most people develop an immune response that eventually gets rid of the virus. While this natural response develops, the virus can make you quite ill. CAUSES  Many different viruses can cause gastroenteritis, such as rotavirus or noroviruses. You can catch one of these viruses by consuming contaminated food or water. You may also catch a virus by sharing utensils or other personal items with an infected person or by touching a contaminated surface. SYMPTOMS  The most common symptoms are diarrhea and vomiting. These problems can cause a severe loss of body fluids (dehydration) and a body salt (electrolyte) imbalance. Other symptoms may include:  Fever.  Headache.  Fatigue.  Abdominal pain. DIAGNOSIS  Your caregiver can usually diagnose viral gastroenteritis based on your symptoms and a physical exam. A stool sample may also be taken to test for the presence of viruses or other infections. TREATMENT  This illness typically goes away on its own. Treatments are aimed at rehydration. The most serious cases of viral gastroenteritis involve vomiting so severely that you are not able to keep fluids down. In these cases, fluids must be given through an intravenous line (IV). HOME CARE INSTRUCTIONS   Drink enough fluids to keep your urine clear or pale yellow. Drink small amounts of fluids frequently and increase the amounts as tolerated.  Ask your caregiver for specific rehydration instructions.  Avoid:  Foods high in sugar.  Alcohol.  Carbonated drinks.  Tobacco.  Juice.  Caffeine drinks.  Extremely hot or cold fluids.  Fatty, greasy foods.  Too much intake of anything at one time.  Dairy products until 24 to 48 hours after diarrhea stops.  You may consume probiotics.  Probiotics are active cultures of beneficial bacteria. They may lessen the amount and number of diarrheal stools in adults. Probiotics can be found in yogurt with active cultures and in supplements.  Wash your hands well to avoid spreading the virus.  Only take over-the-counter or prescription medicines for pain, discomfort, or fever as directed by your caregiver. Do not give aspirin to children. Antidiarrheal medicines are not recommended.  Ask your caregiver if you should continue to take your regular prescribed and over-the-counter medicines.  Keep all follow-up appointments as directed by your caregiver. SEEK IMMEDIATE MEDICAL CARE IF:   You are unable to keep fluids down.  You do not urinate at least once every 6 to 8 hours.  You develop shortness of breath.  You notice blood in your stool or vomit. This may look like coffee grounds.  You have abdominal pain that increases or is concentrated in one small area (localized).  You have persistent vomiting or diarrhea.  You have a fever.  The patient is a child younger than 3 months, and he or she has a fever.  The patient is a child older than 3 months, and he or she has a fever and persistent symptoms.  The patient is a child older than 3 months, and he or she has a fever and symptoms suddenly get worse.  The patient is a baby, and he or she has no tears when crying. MAKE SURE YOU:   Understand these instructions.  Will watch your condition.  Will get help right away if you are not doing well or get worse. Document Released: 04/06/2005 Document Revised: 06/29/2011 Document Reviewed: 01/21/2011   ExitCare Patient Information 2013 [place]. Nausea and Vomiting Nausea is a sick feeling that often comes before throwing up (vomiting). Vomiting is a reflex where stomach contents come out of your mouth. Vomiting can cause severe loss of body fluids (dehydration). Children and elderly adults can become dehydrated quickly,  especially if they also have diarrhea. Nausea and vomiting are symptoms of a condition or disease. It is important to find the cause of your symptoms. CAUSES   Direct irritation of the stomach lining. This irritation can result from increased acid production (gastroesophageal reflux disease), infection, food poisoning, taking certain medicines (such as nonsteroidal anti-inflammatory drugs), alcohol use, or tobacco use.  Signals from the brain.These signals could be caused by a headache, heat exposure, an inner ear disturbance, increased pressure in the brain from injury, infection, a tumor, or a concussion, pain, emotional stimulus, or metabolic problems.  An obstruction in the gastrointestinal tract (bowel obstruction).  Illnesses such as diabetes, hepatitis, gallbladder problems, appendicitis, kidney problems, cancer, sepsis, atypical symptoms of a heart attack, or eating disorders.  Medical treatments such as chemotherapy and radiation.  Receiving medicine that makes you sleep (general anesthetic) during surgery. DIAGNOSIS Your caregiver may ask for tests to be done if the problems do not improve after a few days. Tests may also be done if symptoms are severe or if the reason for the nausea and vomiting is not clear. Tests may include:  Urine tests.  Blood tests.  Stool tests.  Cultures (to look for evidence of infection).  X-rays or other imaging studies. Test results can help your caregiver make decisions about treatment or the need for additional tests. TREATMENT You need to stay well hydrated. Drink frequently but in small amounts.You may wish to drink water, sports drinks, clear broth, or eat frozen ice pops or gelatin dessert to help stay hydrated.When you eat, eating slowly may help prevent nausea.There are also some antinausea medicines that may help prevent nausea. HOME CARE INSTRUCTIONS   Take all medicine as directed by your caregiver.  If you do not have an  appetite, do not force yourself to eat. However, you must continue to drink fluids.  If you have an appetite, eat a normal diet unless your caregiver tells you differently.  Eat a variety of complex carbohydrates (rice, wheat, potatoes, bread), lean meats, yogurt, fruits, and vegetables.  Avoid high-fat foods because they are more difficult to digest.  Drink enough water and fluids to keep your urine clear or pale yellow.  If you are dehydrated, ask your caregiver for specific rehydration instructions. Signs of dehydration may include:  Severe thirst.  Dry lips and mouth.  Dizziness.  Dark urine.  Decreasing urine frequency and amount.  Confusion.  Rapid breathing or pulse. SEEK IMMEDIATE MEDICAL CARE IF:   You have blood or brown flecks (like coffee grounds) in your vomit.  You have black or bloody stools.  You have a severe headache or stiff neck.  You are confused.  You have severe abdominal pain.  You have chest pain or trouble breathing.  You do not urinate at least once every 8 hours.  You develop cold or clammy skin.  You continue to vomit for longer than 24 to 48 hours.  You have a fever. MAKE SURE YOU:   Understand these instructions.  Will watch your condition.  Will get help right away if you are not doing well or get worse. Document Released: 04/06/2005 Document Revised: 06/29/2011 Document Reviewed: 09/03/2010 ExitCare Patient Information 2013  [employer].

## 2012-07-29 ENCOUNTER — Telehealth: Payer: Self-pay | Admitting: Endocrinology

## 2012-07-29 MED ORDER — ONDANSETRON 4 MG PO TBDP: 4.0000 mg | ORAL_TABLET | Freq: Three times a day (TID) | ORAL | Status: DC | PRN

## 2012-07-29 NOTE — Telephone Encounter (Signed)
Please refill x 1 Call 2 weeks if sxs persist

## 2012-07-29 NOTE — Telephone Encounter (Signed)
Pt advised rx will be filled 1 time call if sx'xs persist

## 2012-07-29 NOTE — Telephone Encounter (Signed)
Cornelius at [facility] [facility], # [phone]. Pt has called [name] for a script for Zofran for N&V. Pt has had a script for this before. Please call the pharmacy to [facility] / Sherri S.

## 2012-07-29 NOTE — Telephone Encounter (Signed)
Tried to call pt x 1. Pt does not have a VM set up to leave a message. Pt needs an appt. LM on [name] pharmacy VM advising we can not refill medication, pt needs appt / Sherri S.

## 2012-07-29 NOTE — Telephone Encounter (Signed)
Spoke w/ pt regarding Zofran refill, she says she was told by Webb Silversmith, NP to continue med yesterday. She did not realize that she was running low so she did not get a new script from [place] but she only has 1 or 2 left so she has called [place] for the refill / Sherri S.

## 2012-07-29 NOTE — Telephone Encounter (Signed)
It has been a few weeks since ov for this.  If you still have sxs, please come in for ov

## 2012-07-29 NOTE — Telephone Encounter (Signed)
Pt called back b/c she wanted to let Dr. Loanne Drilling know that her sister has died and she has had a virus, she believes this is the cause of her N&V. Thanks! Stacy Walls

## 2012-08-02 ENCOUNTER — Telehealth: Payer: Self-pay

## 2012-08-02 NOTE — Telephone Encounter (Signed)
Pharmacist, left voicemail please call pt's insurance [id number] to verify why pt was given Ondansetron, and the course of therapy,pt was seen by [facility] on 07/28/12.

## 2012-08-02 NOTE — Telephone Encounter (Signed)
Requested form from [facility], awaiting response.

## 2012-08-02 NOTE — Telephone Encounter (Signed)
PA form faxed to [facility] for generic Zofran, awaiting response.

## 2012-08-03 NOTE — Telephone Encounter (Signed)
PA approved for Ondansetron 05/05/2012-08/03/2013, [place] informed.

## 2012-08-09 ENCOUNTER — Encounter: Payer: Self-pay | Admitting: Cardiology

## 2012-08-09 ENCOUNTER — Ambulatory Visit (INDEPENDENT_AMBULATORY_CARE_PROVIDER_SITE_OTHER): Payer: Medicare Other | Admitting: Cardiology

## 2012-08-09 VITALS — BP 134/82 | HR 62 | Ht 62.5 in | Wt 254.0 lb

## 2012-08-09 DIAGNOSIS — E1129 Type 2 diabetes mellitus with other diabetic kidney complication: Secondary | ICD-10-CM | POA: Diagnosis not present

## 2012-08-09 DIAGNOSIS — E785 Hyperlipidemia, unspecified: Secondary | ICD-10-CM

## 2012-08-09 DIAGNOSIS — N189 Chronic kidney disease, unspecified: Secondary | ICD-10-CM

## 2012-08-09 DIAGNOSIS — I10 Essential (primary) hypertension: Secondary | ICD-10-CM | POA: Diagnosis not present

## 2012-08-09 DIAGNOSIS — I2510 Atherosclerotic heart disease of native coronary artery without angina pectoris: Secondary | ICD-10-CM

## 2012-08-09 LAB — BASIC METABOLIC PANEL
Calcium: 9.3 mg/dL (ref 8.4–10.5)
GFR: 34.39 mL/min — ABNORMAL LOW (ref >60.00)
Sodium: 129 mEq/L — ABNORMAL LOW (ref 135–145)

## 2012-08-09 NOTE — Patient Instructions (Addendum)
LABS TODAY:  BMET  Your physician wants you to follow-up in: 1 year. You will receive a reminder letter in the mail two months in advance. If you don't receive a letter, please call our office to schedule the follow-up appointment.  Make sure you are taking your Lipitor daily as ordered and follow up with Dr. Loanne Drilling.

## 2012-08-09 NOTE — Assessment & Plan Note (Signed)
Under good control. We'll check electrolytes and renal function.

## 2012-08-09 NOTE — Assessment & Plan Note (Signed)
Last lipid profile not at goal. [facility] encouraged her to take her statin on daily basis. Followup with Dr. Loanne Drilling.

## 2012-08-09 NOTE — Assessment & Plan Note (Signed)
Under good control. Continue current medications.

## 2012-08-09 NOTE — Assessment & Plan Note (Signed)
Stable. Continue secondary preventative therapy. Patient encouraged to take her statin daily.

## 2012-08-09 NOTE — Progress Notes (Signed)
HPI This is our strong comes in today for evaluation and management of her coronary artery disease. She has hyperlipidemia, chronic renal insufficiency, diabetes with complications of renal dysfunction.  She is currently having no angina or ischemic symptoms. She denies orthopnea, PND. She is stable peripheral edema left greater than right.  Her last creatinine was a year ago and 2.0. Her last hemoglobin A1c was 6.3%. Her last lipids were not at goal though it does not appear from previous results that she may have been taking her statin. This is being followed by Dr. Loanne Drilling.  Past Medical History  Diagnosis Date  . Chest pain   . Clostridium difficile colitis   . Coronary atherosclerosis of native coronary vessel   . Chronic renal insufficiency   . HLD (hyperlipidemia)   . DM2 (diabetes mellitus, type 2)   . HTN (hypertension)   . Acute cholecystitis   . Dyshidrosis   . Routine general medical examination at a health care facility   . Secondary hyperparathyroidism (of renal origin)   . Hypothyroidism   . Dizziness   . Cough   . Neck pain   . Neck mass   . GERD (gastroesophageal reflux disease)   . Urinary tract infection   . Urinary incontinence   . Gout   . Osteoporosis   . Allergic rhinitis   . Anemia   . Diabetes mellitus     Current Outpatient Prescriptions  Medication Sig Dispense Refill  . allopurinol (ZYLOPRIM) 300 MG tablet TAKE 1 TABLET DAILY  90 tablet  0  . aspirin (ECOTRIN LOW STRENGTH) 81 MG EC tablet Take 81 mg by mouth daily.        Marland Kitchen atorvastatin (LIPITOR) 80 MG tablet Take 1 tablet (80 mg total) by mouth daily.  90 tablet  3  . bromocriptine (PARLODEL) 2.5 MG tablet Take 1 tablet (2.5 mg total) by mouth at bedtime.  30 tablet  11  . epoetin alfa (PROCRIT) 29562 UNIT/ML injection Inject 10,000 Units into the skin every 30 (thirty) days. Dr. Justin Mend      . Ferrous Sulfate (IRON) 325 (65 FE) MG TABS Take 1 tablet by mouth daily.       . furosemide (LASIX) 20 MG  tablet TAKE 1 TABLET ONCE DAILY  90 tablet  1  . losartan-hydrochlorothiazide (HYZAAR) 100-25 MG per tablet Take 1 tablet by mouth daily.  90 tablet  3  . omeprazole (PRILOSEC) 20 MG capsule TAKE 1 CAPSULE DAILY  90 capsule  11  . oxybutynin (DITROPAN) 5 MG tablet TAKE 1 TABLET DAILY  90 tablet  3  . sitaGLIPtin (JANUVIA) 100 MG tablet Take 0.5 tablets (50 mg total) by mouth daily.  15 tablet  11  . triamcinolone ointment (KENALOG) 0.1 % Apply topically 3 (three) times daily. Prn itching  454 g  1   No current facility-administered medications for this visit.    Allergies  Allergen Reactions  . Azithromycin   . Pioglitazone     REACTION: edema  . Sulfonamide Derivatives     REACTION: rash  . Tramadol     dizziness    Family History  Problem Relation Age of Onset  . Coronary artery disease Father   . Stroke Father   . Diabetes Father   . Diabetes Sister   . Stroke Sister   . Diabetes Brother     History   Social History  . Marital Status: Married    Spouse Name: N/A    Number  of Children: N/A  . Years of Education: N/A   Occupational History  . Not on file.   Social History Main Topics  . Smoking status: Never Smoker   . Smokeless tobacco: Not on file  . Alcohol Use: No  . Drug Use: No  . Sexually Active: Not on file   Other Topics Concern  . Not on file   Social History Narrative   Lives with husband and has lived in [place] for over 30 years.     ROS ALL NEGATIVE EXCEPT THOSE NOTED IN HPI  PE  General Appearance: well developed, well nourished in no acute distress, morbidly obese HEENT: symmetrical face, PERRLA, good dentition  Neck: no JVD, thyromegaly, or adenopathy, trachea midline Chest: symmetric without deformity Cardiac: PMI poorly appreciated, RRR, normal S1, S2, no gallop or murmur Lung: clear to ausculation and percussion Vascular: all pulses full without bruits  Abdominal: nondistended, nontender, good bowel sounds, no HSM, no  bruits Extremities: no cyanosis, clubbing, bilateral ankle edema left greater than right , no sign of DVT, no varicosities  Skin: normal color, no rashes Neuro: alert and oriented x 3, non-focal Pysch: normal affect  EKG Normal sinus rhythm, no acute changes  BMET    Component Value Date/Time   NA 135 05/26/2011 1049   K 4.2 05/26/2011 1049   CL 98 05/26/2011 1049   CO2 29 05/26/2011 1049   GLUCOSE 151* 05/26/2011 1049   BUN 34* 05/26/2011 1049   CREATININE 2.00* 05/26/2011 1049   CALCIUM 10.4 05/26/2011 1049   CALCIUM 10.5 01/12/2011 1116   GFRNONAA 23* 05/26/2011 1049   GFRAA 27* 05/26/2011 1049    Lipid Panel     Component Value Date/Time   CHOL 188 01/27/2012 1021   TRIG 140 01/27/2012 1021   HDL 44 01/27/2012 1021   CHOLHDL 4.3 01/27/2012 1021   VLDL 28 01/27/2012 1021   LDLCALC 116* 01/27/2012 1021    CBC    Component Value Date/Time   WBC 6.7 01/27/2012 1044   RBC 3.90 01/27/2012 1044   HGB 11.8* 07/28/2012 1417   HCT 36.4 01/27/2012 1044   PLT 163 01/27/2012 1044   MCV 93.3 01/27/2012 1044   MCH 29.7 01/27/2012 1044   MCHC 31.9 01/27/2012 1044   RDW 13.9 01/27/2012 1044   LYMPHSABS 1.9 01/27/2012 1044   MONOABS 0.6 01/27/2012 1044   EOSABS 0.2 01/27/2012 1044   BASOSABS 0.0 01/27/2012 1044

## 2012-08-10 ENCOUNTER — Other Ambulatory Visit: Payer: Self-pay

## 2012-08-10 MED ORDER — FUROSEMIDE 20 MG PO TABS: ORAL_TABLET | ORAL | Status: DC

## 2012-08-10 MED ORDER — BROMOCRIPTINE MESYLATE 2.5 MG PO TABS: 2.5000 mg | ORAL_TABLET | Freq: Every day | ORAL | Status: DC

## 2012-08-24 ENCOUNTER — Ambulatory Visit (INDEPENDENT_AMBULATORY_CARE_PROVIDER_SITE_OTHER): Payer: Medicare Other | Admitting: Endocrinology

## 2012-08-24 ENCOUNTER — Encounter: Payer: Self-pay | Admitting: Endocrinology

## 2012-08-24 DIAGNOSIS — E1129 Type 2 diabetes mellitus with other diabetic kidney complication: Secondary | ICD-10-CM | POA: Insufficient documentation

## 2012-08-24 NOTE — Patient Instructions (Addendum)
Please come back for a "medicare wellness" appointment after 01/26/13.  blood tests are being requested for you today.  We'll contact you with results.

## 2012-08-24 NOTE — Progress Notes (Signed)
Subjective:    Patient ID: Stacy Walls, female    DOB: [birth year], 79 y.o.   MRN: [id number]  HPI Pt returns for f/u of type 2 DM (dx'ed 99991111; complicated by renal insufficiency and CAD).  pt states she feels well in general.  no cbg record, but states cbg's are well-controlled.  denies hypoglycemia.   Past Medical History  Diagnosis Date  . Chest pain   . Clostridium difficile colitis   . Coronary atherosclerosis of native coronary vessel   . Chronic renal insufficiency   . HLD (hyperlipidemia)   . DM2 (diabetes mellitus, type 2)   . HTN (hypertension)   . Acute cholecystitis   . Dyshidrosis   . Routine general medical examination at a health care facility   . Secondary hyperparathyroidism (of renal origin)   . Hypothyroidism   . Dizziness   . Cough   . Neck pain   . Neck mass   . GERD (gastroesophageal reflux disease)   . Urinary tract infection   . Urinary incontinence   . Gout   . Osteoporosis   . Allergic rhinitis   . Anemia   . Diabetes mellitus     Past Surgical History  Procedure Laterality Date  . C-section (other)  1977  . L ankle surgery  1988  . Electrocardiogram  02/01/06  . Bone density  08/21/05  . Cholecystectomy    . Total left knee  2007    History   Social History  . Marital Status: Married    Spouse Name: N/A    Number of Children: N/A  . Years of Education: N/A   Occupational History  . Not on file.   Social History Main Topics  . Smoking status: Never Smoker   . Smokeless tobacco: Not on file  . Alcohol Use: No  . Drug Use: No  . Sexually Active: Not on file   Other Topics Concern  . Not on file   Social History Narrative   Lives with husband and has lived in [place] for over 30 years.     Current Outpatient Prescriptions on File Prior to Visit  Medication Sig Dispense Refill  . allopurinol (ZYLOPRIM) 300 MG tablet TAKE 1 TABLET DAILY  90 tablet  0  . aspirin (ECOTRIN LOW STRENGTH) 81 MG EC tablet Take 81 mg by  mouth daily.        Marland Kitchen atorvastatin (LIPITOR) 80 MG tablet Take 1 tablet (80 mg total) by mouth daily.  90 tablet  3  . bromocriptine (PARLODEL) 2.5 MG tablet Take 1 tablet (2.5 mg total) by mouth at bedtime.  30 tablet  11  . epoetin alfa (PROCRIT) 24401 UNIT/ML injection Inject 10,000 Units into the skin every 30 (thirty) days. Dr. Justin Mend      . Ferrous Sulfate (IRON) 325 (65 FE) MG TABS Take 1 tablet by mouth daily.       . furosemide (LASIX) 20 MG tablet TAKE 1 TABLET ONCE DAILY  90 tablet  1  . losartan-hydrochlorothiazide (HYZAAR) 100-25 MG per tablet Take 1 tablet by mouth daily.  90 tablet  3  . omeprazole (PRILOSEC) 20 MG capsule TAKE 1 CAPSULE DAILY  90 capsule  11  . oxybutynin (DITROPAN) 5 MG tablet TAKE 1 TABLET DAILY  90 tablet  3  . sitaGLIPtin (JANUVIA) 100 MG tablet Take 0.5 tablets (50 mg total) by mouth daily.  15 tablet  11  . triamcinolone ointment (KENALOG) 0.1 % Apply topically 3 (three) times  daily. Prn itching  454 g  1   No current facility-administered medications on file prior to visit.    Allergies  Allergen Reactions  . Azithromycin   . Pioglitazone     REACTION: edema  . Sulfonamide Derivatives     REACTION: rash  . Tramadol     dizziness    Family History  Problem Relation Age of Onset  . Coronary artery disease Father   . Stroke Father   . Diabetes Father   . Diabetes Sister   . Stroke Sister   . Diabetes Brother     BP 134/70  Pulse 70  Ht 5\' 3"  (1.6 m)  Wt 254 lb (115.214 kg)  BMI 45.01 kg/m2  SpO2 94%    Review of Systems Denies weight change    Objective:   Physical Exam VITAL SIGNS:  See vs page GENERAL: no distress  Lab Results  Component Value Date   HGBA1C 6.3 08/24/2012      Assessment & Plan:  DM: well-controlled

## 2012-08-25 ENCOUNTER — Encounter (HOSPITAL_COMMUNITY)
Admission: RE | Admit: 2012-08-25 | Discharge: 2012-08-25 | Disposition: A | Discharge status: Home / Self Care | Payer: Medicare Other | Source: Ambulatory Visit | Attending: Nephrology | Admitting: Nephrology

## 2012-08-25 DIAGNOSIS — D638 Anemia in other chronic diseases classified elsewhere: Secondary | ICD-10-CM | POA: Insufficient documentation

## 2012-08-25 DIAGNOSIS — N184 Chronic kidney disease, stage 4 (severe): Secondary | ICD-10-CM | POA: Diagnosis not present

## 2012-08-25 LAB — IRON AND TIBC
Iron: 82 ug/dL (ref 42–135)
Saturation Ratios: 40 % (ref 20–55)
TIBC: 207 ug/dL — ABNORMAL LOW (ref 250–470)
UIBC: 125 ug/dL (ref 125–400)

## 2012-08-25 MED ORDER — EPOETIN ALFA 10000 UNIT/ML IJ SOLN
INTRAMUSCULAR | Status: AC
  Filled 2012-08-25: qty 1

## 2012-08-25 MED ORDER — EPOETIN ALFA 10000 UNIT/ML IJ SOLN
10000.0000 [IU] | INTRAMUSCULAR | Status: DC
  Administered 2012-08-25: 10000 [IU] via SUBCUTANEOUS

## 2012-08-26 DIAGNOSIS — E119 Type 2 diabetes mellitus without complications: Secondary | ICD-10-CM | POA: Diagnosis not present

## 2012-08-26 DIAGNOSIS — I129 Hypertensive chronic kidney disease with stage 1 through stage 4 chronic kidney disease, or unspecified chronic kidney disease: Secondary | ICD-10-CM | POA: Diagnosis not present

## 2012-08-26 DIAGNOSIS — D649 Anemia, unspecified: Secondary | ICD-10-CM | POA: Diagnosis not present

## 2012-09-19 ENCOUNTER — Other Ambulatory Visit: Payer: Self-pay

## 2012-09-19 ENCOUNTER — Other Ambulatory Visit: Payer: Self-pay | Admitting: *Deleted

## 2012-09-19 MED ORDER — OXYBUTYNIN CHLORIDE 5 MG PO TABS: ORAL_TABLET | ORAL | Status: DC

## 2012-09-19 MED ORDER — ALLOPURINOL 300 MG PO TABS: ORAL_TABLET | ORAL | Status: DC

## 2012-09-21 ENCOUNTER — Other Ambulatory Visit (HOSPITAL_COMMUNITY): Payer: Self-pay | Admitting: *Deleted

## 2012-09-22 ENCOUNTER — Encounter (HOSPITAL_COMMUNITY)
Admission: RE | Admit: 2012-09-22 | Discharge: 2012-09-22 | Disposition: A | Discharge status: Home / Self Care | Payer: Medicare Other | Source: Ambulatory Visit | Attending: Nephrology | Admitting: Nephrology

## 2012-09-22 DIAGNOSIS — D638 Anemia in other chronic diseases classified elsewhere: Secondary | ICD-10-CM | POA: Insufficient documentation

## 2012-09-22 DIAGNOSIS — N184 Chronic kidney disease, stage 4 (severe): Secondary | ICD-10-CM | POA: Insufficient documentation

## 2012-09-22 LAB — FERRITIN: Ferritin: 898 ng/mL — ABNORMAL HIGH (ref 10–291)

## 2012-09-22 LAB — POCT HEMOGLOBIN-HEMACUE: Hemoglobin: 11 g/dL — ABNORMAL LOW (ref 12.0–15.0)

## 2012-09-22 MED ORDER — EPOETIN ALFA 10000 UNIT/ML IJ SOLN
10000.0000 [IU] | INTRAMUSCULAR | Status: DC
  Administered 2012-09-22: 10000 [IU] via SUBCUTANEOUS

## 2012-09-22 MED ORDER — EPOETIN ALFA 10000 UNIT/ML IJ SOLN
INTRAMUSCULAR | Status: AC
  Filled 2012-09-22: qty 1

## 2012-10-05 ENCOUNTER — Ambulatory Visit (INDEPENDENT_AMBULATORY_CARE_PROVIDER_SITE_OTHER): Payer: Medicare Other | Admitting: Endocrinology

## 2012-10-05 ENCOUNTER — Encounter: Payer: Self-pay | Admitting: Endocrinology

## 2012-10-05 VITALS — BP 122/80 | HR 63 | Ht 62.0 in | Wt 253.0 lb

## 2012-10-05 DIAGNOSIS — R51 Headache: Secondary | ICD-10-CM

## 2012-10-05 MED ORDER — VALACYCLOVIR HCL 1 G PO TABS: 1000.0000 mg | ORAL_TABLET | Freq: Three times a day (TID) | ORAL | Status: DC

## 2012-10-05 NOTE — Progress Notes (Signed)
Subjective:    Patient ID: Stacy Walls, female    DOB: 20-Aug-1932, 79 y.o.   MRN: [id number]  HPI Pt states few days of moderate pain at the left ear, left face, and left frontal area.  Pain is unaffected by ROM of the neck.   Past Medical History  Diagnosis Date  . Chest pain   . Clostridium difficile colitis   . Coronary atherosclerosis of native coronary vessel   . Chronic renal insufficiency   . HLD (hyperlipidemia)   . DM2 (diabetes mellitus, type 2)   . HTN (hypertension)   . Acute cholecystitis   . Dyshidrosis   . Routine general medical examination at a health care facility   . Secondary hyperparathyroidism (of renal origin)   . Hypothyroidism   . Dizziness   . Cough   . Neck pain   . Neck mass   . GERD (gastroesophageal reflux disease)   . Urinary tract infection   . Urinary incontinence   . Gout   . Osteoporosis   . Allergic rhinitis   . Anemia   . Diabetes mellitus     Past Surgical History  Procedure Laterality Date  . C-section (other)  1977  . L ankle surgery  1988  . Electrocardiogram  02/01/06  . Bone density  08/21/05  . Cholecystectomy    . Total left knee  2007    History   Social History  . Marital Status: Married    Spouse Name: N/A    Number of Children: N/A  . Years of Education: N/A   Occupational History  . Not on file.   Social History Main Topics  . Smoking status: Never Smoker   . Smokeless tobacco: Not on file  . Alcohol Use: No  . Drug Use: No  . Sexually Active: Not on file   Other Topics Concern  . Not on file   Social History Narrative   Lives with husband and has lived in [place] for over 30 years.     Current Outpatient Prescriptions on File Prior to Visit  Medication Sig Dispense Refill  . allopurinol (ZYLOPRIM) 300 MG tablet TAKE 1 TABLET DAILY  90 tablet  3  . aspirin (ECOTRIN LOW STRENGTH) 81 MG EC tablet Take 81 mg by mouth daily.        Marland Kitchen atorvastatin (LIPITOR) 80 MG tablet Take 1 tablet (80 mg  total) by mouth daily.  90 tablet  3  . bromocriptine (PARLODEL) 2.5 MG tablet Take 1 tablet (2.5 mg total) by mouth at bedtime.  30 tablet  11  . epoetin alfa (PROCRIT) 24401 UNIT/ML injection Inject 10,000 Units into the skin every 30 (thirty) days. Dr. Justin Mend      . Ferrous Sulfate (IRON) 325 (65 FE) MG TABS Take 1 tablet by mouth daily.       . furosemide (LASIX) 20 MG tablet TAKE 1 TABLET ONCE DAILY  90 tablet  1  . losartan-hydrochlorothiazide (HYZAAR) 100-25 MG per tablet Take 1 tablet by mouth daily.  90 tablet  3  . omeprazole (PRILOSEC) 20 MG capsule TAKE 1 CAPSULE DAILY  90 capsule  11  . ondansetron (ZOFRAN-ODT) 4 MG disintegrating tablet       . oxybutynin (DITROPAN) 5 MG tablet TAKE 1 TABLET DAILY  90 tablet  2  . sitaGLIPtin (JANUVIA) 100 MG tablet Take 0.5 tablets (50 mg total) by mouth daily.  15 tablet  11  . triamcinolone ointment (KENALOG) 0.1 % Apply topically  3 (three) times daily. Prn itching  454 g  1  . [DISCONTINUED] oxybutynin (DITROPAN) 5 MG tablet TAKE 1 TABLET DAILY  90 tablet  3   No current facility-administered medications on file prior to visit.    Allergies  Allergen Reactions  . Azithromycin   . Pioglitazone     REACTION: edema  . Sulfonamide Derivatives     REACTION: rash  . Tramadol     dizziness    Family History  Problem Relation Age of Onset  . Coronary artery disease Father   . Stroke Father   . Diabetes Father   . Diabetes Sister   . Stroke Sister   . Diabetes Brother     BP 122/80  Pulse 63  Ht 5\' 2"  (1.575 m)  Wt 253 lb (114.76 kg)  BMI 46.26 kg/m2  SpO2 98%  Review of Systems Denies fever and visual loss.      Objective:   Physical Exam VITAL SIGNS:  See vs page GENERAL: no distress head: no deformity eyes: no periorbital swelling, no proptosis external nose and ears are normal mouth: no lesion seen Both eac's and tm's are normal.  Lab Results  Component Value Date   WBC 6.7 01/27/2012   HGB 11.0* 09/22/2012   HCT  36.4 01/27/2012   PLT 163 01/27/2012   GLUCOSE 108* 08/09/2012   CHOL 188 01/27/2012   TRIG 140 01/27/2012   HDL 44 01/27/2012   LDLDIRECT 108.6 11/10/2010   LDLCALC 116* 01/27/2012   ALT 11 01/27/2012   AST 14 01/27/2012   NA 129* 08/09/2012   K 4.0 08/09/2012   CL 96 08/09/2012   CREATININE 1.8* 08/09/2012   BUN 28* 08/09/2012   CO2 26 08/09/2012   TSH 7.422* 01/27/2012   HGBA1C 6.3 08/24/2012   MICROALBUR 0.2 01/12/2011      Assessment & Plan:  Facial pain, new, uncertain etiology.  Zoster is most likely cause.  She can't take vicodin or tramadol, due to h/o intolerance.  She decides to take just the valtrex for now.  Renal insuff.  This precludes NSAID.

## 2012-10-05 NOTE — Patient Instructions (Addendum)
i have sent a prescription to your pharmacy, for a medication against shingles. I hope you feel better soon.  If you don't feel better by next week, please call back.  Please call sooner if you get worse.

## 2012-10-06 ENCOUNTER — Telehealth: Payer: Self-pay

## 2012-10-06 NOTE — Telephone Encounter (Signed)
Pt advised.

## 2012-10-06 NOTE — Telephone Encounter (Signed)
Pt states the rx Valacyclovir is making her dizzy, plesse advise

## 2012-10-06 NOTE — Telephone Encounter (Signed)
Try decreasing to bid

## 2012-10-19 ENCOUNTER — Encounter (HOSPITAL_COMMUNITY)
Admission: RE | Admit: 2012-10-19 | Discharge: 2012-10-19 | Disposition: A | Discharge status: Home / Self Care | Payer: Medicare Other | Source: Ambulatory Visit | Attending: Nephrology | Admitting: Nephrology

## 2012-10-19 DIAGNOSIS — N184 Chronic kidney disease, stage 4 (severe): Secondary | ICD-10-CM | POA: Insufficient documentation

## 2012-10-19 DIAGNOSIS — D638 Anemia in other chronic diseases classified elsewhere: Secondary | ICD-10-CM | POA: Diagnosis not present

## 2012-10-19 LAB — IRON AND TIBC
Saturation Ratios: 33 % (ref 20–55)
TIBC: 202 ug/dL — ABNORMAL LOW (ref 250–470)

## 2012-10-19 LAB — POCT HEMOGLOBIN-HEMACUE: Hemoglobin: 11.2 g/dL — ABNORMAL LOW (ref 12.0–15.0)

## 2012-10-19 MED ORDER — EPOETIN ALFA 10000 UNIT/ML IJ SOLN
INTRAMUSCULAR | Status: AC
  Filled 2012-10-19: qty 1

## 2012-10-19 MED ORDER — EPOETIN ALFA 10000 UNIT/ML IJ SOLN
10000.0000 [IU] | INTRAMUSCULAR | Status: DC
  Administered 2012-10-19: 10000 [IU] via SUBCUTANEOUS

## 2012-10-20 ENCOUNTER — Encounter (HOSPITAL_COMMUNITY): Payer: Medicare Other

## 2012-11-16 ENCOUNTER — Encounter (HOSPITAL_COMMUNITY): Payer: Medicare Other

## 2012-11-21 ENCOUNTER — Encounter (HOSPITAL_COMMUNITY)
Admission: RE | Admit: 2012-11-21 | Discharge: 2012-11-21 | Disposition: A | Discharge status: Home / Self Care | Payer: Medicare Other | Source: Ambulatory Visit | Attending: Nephrology | Admitting: Nephrology

## 2012-11-21 DIAGNOSIS — N184 Chronic kidney disease, stage 4 (severe): Secondary | ICD-10-CM | POA: Diagnosis not present

## 2012-11-21 DIAGNOSIS — D638 Anemia in other chronic diseases classified elsewhere: Secondary | ICD-10-CM | POA: Diagnosis not present

## 2012-11-21 LAB — IRON AND TIBC
Iron: 69 ug/dL (ref 42–135)
Saturation Ratios: 34 % (ref 20–55)

## 2012-11-21 MED ORDER — EPOETIN ALFA 10000 UNIT/ML IJ SOLN: 10000.0000 [IU] | INTRAMUSCULAR | Status: DC

## 2012-11-21 MED ORDER — EPOETIN ALFA 10000 UNIT/ML IJ SOLN
INTRAMUSCULAR | Status: AC
  Administered 2012-11-21: 10000 [IU] via SUBCUTANEOUS
  Filled 2012-11-21: qty 1

## 2012-11-29 ENCOUNTER — Ambulatory Visit (INDEPENDENT_AMBULATORY_CARE_PROVIDER_SITE_OTHER): Payer: Medicare Other | Admitting: Endocrinology

## 2012-11-29 ENCOUNTER — Encounter: Payer: Self-pay | Admitting: Endocrinology

## 2012-11-29 VITALS — BP 130/76 | HR 78 | Ht 62.0 in | Wt 254.0 lb

## 2012-11-29 DIAGNOSIS — L301 Dyshidrosis [pompholyx]: Secondary | ICD-10-CM | POA: Diagnosis not present

## 2012-11-29 MED ORDER — TRIAMCINOLONE ACETONIDE 0.1 % EX OINT: TOPICAL_OINTMENT | Freq: Three times a day (TID) | CUTANEOUS | Status: DC

## 2012-11-29 MED ORDER — LOSARTAN POTASSIUM-HCTZ 100-25 MG PO TABS: 1.0000 | ORAL_TABLET | Freq: Every day | ORAL | Status: DC

## 2012-11-29 MED ORDER — OMEPRAZOLE 20 MG PO CPDR: DELAYED_RELEASE_CAPSULE | ORAL | Status: DC

## 2012-11-29 MED ORDER — ATORVASTATIN CALCIUM 80 MG PO TABS: 80.0000 mg | ORAL_TABLET | Freq: Every day | ORAL | Status: DC

## 2012-11-29 NOTE — Progress Notes (Signed)
Subjective:    Patient ID: Stacy Walls, female    DOB: [birth year], 79 y.o.   MRN: [id number]  HPI Pt states few days of slight rash on the right forearm, and assoc itching.   Past Medical History  Diagnosis Date  . Chest pain   . Clostridium difficile colitis   . Coronary atherosclerosis of native coronary vessel   . Chronic renal insufficiency   . HLD (hyperlipidemia)   . DM2 (diabetes mellitus, type 2)   . HTN (hypertension)   . Acute cholecystitis   . Dyshidrosis   . Routine general medical examination at a health care facility   . Secondary hyperparathyroidism (of renal origin)   . Hypothyroidism   . Dizziness   . Cough   . Neck pain   . Neck mass   . GERD (gastroesophageal reflux disease)   . Urinary tract infection   . Urinary incontinence   . Gout   . Osteoporosis   . Allergic rhinitis   . Anemia   . Diabetes mellitus     Past Surgical History  Procedure Laterality Date  . C-section (other)  1977  . L ankle surgery  1988  . Electrocardiogram  02/01/06  . Bone density  08/21/05  . Cholecystectomy    . Total left knee  2007    History   Social History  . Marital Status: Married    Spouse Name: N/A    Number of Children: N/A  . Years of Education: N/A   Occupational History  . Not on file.   Social History Main Topics  . Smoking status: Never Smoker   . Smokeless tobacco: Not on file  . Alcohol Use: No  . Drug Use: No  . Sexual Activity: Not on file   Other Topics Concern  . Not on file   Social History Narrative   Lives with husband and has lived in [place] for over 30 years.     Current Outpatient Prescriptions on File Prior to Visit  Medication Sig Dispense Refill  . allopurinol (ZYLOPRIM) 300 MG tablet TAKE 1 TABLET DAILY  90 tablet  3  . aspirin (ECOTRIN LOW STRENGTH) 81 MG EC tablet Take 81 mg by mouth daily.        . bromocriptine (PARLODEL) 2.5 MG tablet Take 1 tablet (2.5 mg total) by mouth at bedtime.  30 tablet  11  .  epoetin alfa (PROCRIT) 16109 UNIT/ML injection Inject 10,000 Units into the skin every 30 (thirty) days. Dr. Justin Mend      . Ferrous Sulfate (IRON) 325 (65 FE) MG TABS Take 1 tablet by mouth daily.       . furosemide (LASIX) 20 MG tablet TAKE 1 TABLET ONCE DAILY  90 tablet  1  . ondansetron (ZOFRAN-ODT) 4 MG disintegrating tablet       . oxybutynin (DITROPAN) 5 MG tablet TAKE 1 TABLET DAILY  90 tablet  2  . sitaGLIPtin (JANUVIA) 100 MG tablet Take 0.5 tablets (50 mg total) by mouth daily.  15 tablet  11  . valACYclovir (VALTREX) 1000 MG tablet Take 1 tablet (1,000 mg total) by mouth 3 (three) times daily.  15 tablet  0  . [DISCONTINUED] oxybutynin (DITROPAN) 5 MG tablet TAKE 1 TABLET DAILY  90 tablet  3   No current facility-administered medications on file prior to visit.    Allergies  Allergen Reactions  . Azithromycin   . Pioglitazone     REACTION: edema  . Sulfonamide Derivatives  REACTION: rash  . Tramadol     dizziness    Family History  Problem Relation Age of Onset  . Coronary artery disease Father   . Stroke Father   . Diabetes Father   . Diabetes Sister   . Stroke Sister   . Diabetes Brother     BP 130/76  Pulse 78  Ht 5\' 2"  (1.575 m)  Wt 254 lb (115.214 kg)  BMI 46.45 kg/m2  SpO2 98%   Review of Systems The rash is not painful.  No fever    Objective:   Physical Exam VITAL SIGNS:  See vs page GENERAL: no distress Skin: mild polymacular rash on the right forearm.       Assessment & Plan:  Rash, new, uncertain etiology

## 2012-11-29 NOTE — Patient Instructions (Addendum)
i have sent a prescription to your pharmacy, to refill the anti-itch cream. Please come back for a "medicare wellness" appointment in 3 months.

## 2012-12-20 ENCOUNTER — Encounter (HOSPITAL_COMMUNITY): Payer: Medicare Other

## 2012-12-21 ENCOUNTER — Other Ambulatory Visit (HOSPITAL_COMMUNITY): Payer: Self-pay | Admitting: *Deleted

## 2012-12-22 ENCOUNTER — Encounter (HOSPITAL_COMMUNITY)
Admission: RE | Admit: 2012-12-22 | Discharge: 2012-12-22 | Disposition: A | Discharge status: Home / Self Care | Payer: Medicare Other | Source: Ambulatory Visit | Attending: Nephrology | Admitting: Nephrology

## 2012-12-22 DIAGNOSIS — D638 Anemia in other chronic diseases classified elsewhere: Secondary | ICD-10-CM | POA: Insufficient documentation

## 2012-12-22 DIAGNOSIS — N184 Chronic kidney disease, stage 4 (severe): Secondary | ICD-10-CM | POA: Insufficient documentation

## 2012-12-22 LAB — IRON AND TIBC
Iron: 78 ug/dL (ref 42–135)
TIBC: 207 ug/dL — ABNORMAL LOW (ref 250–470)
UIBC: 129 ug/dL (ref 125–400)

## 2012-12-22 LAB — FERRITIN: Ferritin: 714 ng/mL — ABNORMAL HIGH (ref 10–291)

## 2012-12-22 MED ORDER — EPOETIN ALFA 10000 UNIT/ML IJ SOLN
10000.0000 [IU] | INTRAMUSCULAR | Status: DC
  Administered 2012-12-22: 10000 [IU] via SUBCUTANEOUS

## 2012-12-22 MED ORDER — EPOETIN ALFA 10000 UNIT/ML IJ SOLN
INTRAMUSCULAR | Status: AC
  Filled 2012-12-22: qty 1

## 2013-01-13 ENCOUNTER — Encounter: Payer: Self-pay | Admitting: Endocrinology

## 2013-01-13 ENCOUNTER — Ambulatory Visit (INDEPENDENT_AMBULATORY_CARE_PROVIDER_SITE_OTHER): Payer: Medicare Other | Admitting: Endocrinology

## 2013-01-13 VITALS — BP 126/80 | HR 84 | Wt 252.0 lb

## 2013-01-13 DIAGNOSIS — Z79899 Other long term (current) drug therapy: Secondary | ICD-10-CM | POA: Diagnosis not present

## 2013-01-13 DIAGNOSIS — M109 Gout, unspecified: Secondary | ICD-10-CM

## 2013-01-13 DIAGNOSIS — E039 Hypothyroidism, unspecified: Secondary | ICD-10-CM

## 2013-01-13 DIAGNOSIS — Z23 Encounter for immunization: Secondary | ICD-10-CM | POA: Diagnosis not present

## 2013-01-13 DIAGNOSIS — E119 Type 2 diabetes mellitus without complications: Secondary | ICD-10-CM | POA: Diagnosis not present

## 2013-01-13 DIAGNOSIS — E785 Hyperlipidemia, unspecified: Secondary | ICD-10-CM | POA: Diagnosis not present

## 2013-01-13 LAB — URINALYSIS, ROUTINE W REFLEX MICROSCOPIC
Bilirubin Urine: NEGATIVE
Hgb urine dipstick: NEGATIVE
Specific Gravity, Urine: 1.01 (ref 1.000–1.030)
Total Protein, Urine: NEGATIVE
Urine Glucose: NEGATIVE
Urobilinogen, UA: 0.2 (ref 0.0–1.0)

## 2013-01-13 LAB — MICROALBUMIN / CREATININE URINE RATIO
Creatinine,U: 106.4 mg/dL
Microalb Creat Ratio: 0.3 mg/g (ref 0.0–30.0)

## 2013-01-13 LAB — LIPID PANEL
HDL: 48.4 mg/dL (ref >39.00)
Triglycerides: 133 mg/dL (ref 0.0–149.0)
VLDL: 26.6 mg/dL (ref 0.0–40.0)

## 2013-01-13 LAB — HEPATIC FUNCTION PANEL
ALT: 15 U/L (ref 0–35)
Total Protein: 6.7 g/dL (ref 6.0–8.3)

## 2013-01-13 LAB — HEMOGLOBIN A1C: Hgb A1c MFr Bld: 6.3 % (ref 4.6–6.5)

## 2013-01-13 LAB — URIC ACID: Uric Acid, Serum: 4.4 mg/dL (ref 2.4–7.0)

## 2013-01-13 NOTE — Progress Notes (Signed)
Subjective:    Patient ID: Stacy Walls, female    DOB: Dec 24, 1932, 79 y.o.   MRN: [id number]  HPI Pt states few days of slight pain at both ears, and assoc sore throat.   Past Medical History  Diagnosis Date  . Chest pain   . Clostridium difficile colitis   . Coronary atherosclerosis of native coronary vessel   . Chronic renal insufficiency   . HLD (hyperlipidemia)   . DM2 (diabetes mellitus, type 2)   . HTN (hypertension)   . Acute cholecystitis   . Dyshidrosis   . Routine general medical examination at a health care facility   . Secondary hyperparathyroidism (of renal origin)   . Hypothyroidism   . Dizziness   . Cough   . Neck pain   . Neck mass   . GERD (gastroesophageal reflux disease)   . Urinary tract infection   . Urinary incontinence   . Gout   . Osteoporosis   . Allergic rhinitis   . Anemia   . Diabetes mellitus     Past Surgical History  Procedure Laterality Date  . C-section (other)  1977  . L ankle surgery  1988  . Electrocardiogram  02/01/06  . Bone density  08/21/05  . Cholecystectomy    . Total left knee  2007    History   Social History  . Marital Status: Married    Spouse Name: N/A    Number of Children: N/A  . Years of Education: N/A   Occupational History  . Not on file.   Social History Main Topics  . Smoking status: Never Smoker   . Smokeless tobacco: Not on file  . Alcohol Use: No  . Drug Use: No  . Sexual Activity: Not on file   Other Topics Concern  . Not on file   Social History Narrative   Lives with husband and has lived in [place] for over 30 years.     Current Outpatient Prescriptions on File Prior to Visit  Medication Sig Dispense Refill  . allopurinol (ZYLOPRIM) 300 MG tablet TAKE 1 TABLET DAILY  90 tablet  3  . aspirin (ECOTRIN LOW STRENGTH) 81 MG EC tablet Take 81 mg by mouth daily.        Marland Kitchen atorvastatin (LIPITOR) 80 MG tablet Take 1 tablet (80 mg total) by mouth daily.  90 tablet  3  . bromocriptine  (PARLODEL) 2.5 MG tablet Take 1 tablet (2.5 mg total) by mouth at bedtime.  30 tablet  11  . epoetin alfa (PROCRIT) 91478 UNIT/ML injection Inject 10,000 Units into the skin every 30 (thirty) days. Dr. Justin Mend      . Ferrous Sulfate (IRON) 325 (65 FE) MG TABS Take 1 tablet by mouth daily.       . furosemide (LASIX) 20 MG tablet TAKE 1 TABLET ONCE DAILY  90 tablet  1  . losartan-hydrochlorothiazide (HYZAAR) 100-25 MG per tablet Take 1 tablet by mouth daily.  90 tablet  3  . omeprazole (PRILOSEC) 20 MG capsule TAKE 1 CAPSULE DAILY  90 capsule  11  . ondansetron (ZOFRAN-ODT) 4 MG disintegrating tablet       . oxybutynin (DITROPAN) 5 MG tablet TAKE 1 TABLET DAILY  90 tablet  2  . sitaGLIPtin (JANUVIA) 100 MG tablet Take 0.5 tablets (50 mg total) by mouth daily.  15 tablet  11  . triamcinolone ointment (KENALOG) 0.1 % Apply topically 3 (three) times daily. Prn itching  454 g  1  . [  DISCONTINUED] oxybutynin (DITROPAN) 5 MG tablet TAKE 1 TABLET DAILY  90 tablet  3   No current facility-administered medications on file prior to visit.    Allergies  Allergen Reactions  . Azithromycin   . Pioglitazone     REACTION: edema  . Sulfonamide Derivatives     REACTION: rash  . Tramadol     dizziness    Family History  Problem Relation Age of Onset  . Coronary artery disease Father   . Stroke Father   . Diabetes Father   . Diabetes Sister   . Stroke Sister   . Diabetes Brother     BP 126/80  Pulse 84  Wt 252 lb (114.306 kg)  BMI 46.08 kg/m2  SpO2 94%  Review of Systems Denies fever and cough.     Objective:   Physical Exam VITAL SIGNS:  See vs page GENERAL: no distress head: no deformity eyes: no periorbital swelling, no proptosis external nose and ears are normal mouth: no lesion seen Both eac's and tm's are normal  Lab Results  Component Value Date   WBC 6.7 01/27/2012   HGB 11.0* 12/22/2012   HCT 36.4 01/27/2012   PLT 163 01/27/2012   GLUCOSE 108* 08/09/2012   CHOL 154 01/13/2013    TRIG 133.0 01/13/2013   HDL 48.40 01/13/2013   LDLDIRECT 108.6 11/10/2010   LDLCALC 79 01/13/2013   ALT 15 01/13/2013   AST 20 01/13/2013   NA 129* 08/09/2012   K 4.0 08/09/2012   CL 96 08/09/2012   CREATININE 1.8* 08/09/2012   BUN 28* 08/09/2012   CO2 26 08/09/2012   TSH 3.52 01/13/2013   HGBA1C 6.3 01/13/2013   MICROALBUR 0.3 01/13/2013      Assessment & Plan:  Otalgia: normal exam, so this might be due to allergic rhinitis, but i cannot be sure.   DM: well-controlled. Dyslipidemia: well-controlled.

## 2013-01-13 NOTE — Patient Instructions (Addendum)
i have sent a prescription to your pharmacy, to refill the anti-itch cream.  Please come back for a "medicare wellness" appointment after 01/26/13.  blood tests are being requested for you today.  We'll contact you with results.

## 2013-01-19 ENCOUNTER — Encounter (HOSPITAL_COMMUNITY)
Admission: RE | Admit: 2013-01-19 | Discharge: 2013-01-19 | Disposition: A | Discharge status: Home / Self Care | Payer: Medicare Other | Source: Ambulatory Visit | Attending: Nephrology | Admitting: Nephrology

## 2013-01-19 DIAGNOSIS — N184 Chronic kidney disease, stage 4 (severe): Secondary | ICD-10-CM | POA: Diagnosis not present

## 2013-01-19 DIAGNOSIS — D638 Anemia in other chronic diseases classified elsewhere: Secondary | ICD-10-CM | POA: Insufficient documentation

## 2013-01-19 MED ORDER — EPOETIN ALFA 10000 UNIT/ML IJ SOLN
INTRAMUSCULAR | Status: AC
  Administered 2013-01-19: 10000 [IU] via SUBCUTANEOUS
  Filled 2013-01-19: qty 1

## 2013-01-20 LAB — IRON AND TIBC: UIBC: 131 ug/dL (ref 125–400)

## 2013-01-20 LAB — FERRITIN: Ferritin: 899 ng/mL — ABNORMAL HIGH (ref 10–291)

## 2013-01-20 MED ORDER — EPOETIN ALFA 10000 UNIT/ML IJ SOLN: 10000.0000 [IU] | INTRAMUSCULAR | Status: AC

## 2013-01-20 NOTE — Progress Notes (Signed)
Received call from Lab that they had saved blood from yesterday's visit because the orders were not released and they could not run tests. I released orders and called lab back.

## 2013-01-30 ENCOUNTER — Encounter: Payer: Self-pay | Admitting: Endocrinology

## 2013-01-30 ENCOUNTER — Ambulatory Visit (INDEPENDENT_AMBULATORY_CARE_PROVIDER_SITE_OTHER): Payer: Medicare Other | Admitting: Endocrinology

## 2013-01-30 VITALS — BP 134/78 | HR 76 | Ht 62.0 in | Wt 253.0 lb

## 2013-01-30 DIAGNOSIS — Z0000 Encounter for general adult medical examination without abnormal findings: Secondary | ICD-10-CM | POA: Diagnosis not present

## 2013-01-30 NOTE — Progress Notes (Signed)
Subjective:    Patient ID: Stacy Walls, female    DOB: 02-Sep-1932, 79 y.o.   MRN: [id number] HPI Review of Systems    Physical Exam  Patient here for Medicare annual wellness visit    Risk factors: advanced age.      Roster of Physicians Providing Medical Care to Patient:  See "snapshot"   Activities of Daily Living: In your present state of health, do you have any difficulty performing the following activities?:  Preparing food and eating?: No  Bathing yourself: No  Getting dressed: No  Using the toilet:No  Moving around from place to place: No  In the past year have you fallen or had a near fall?:No    Home Safety: Has smoke detector and wears seat belts. No firearms.  Diet and Exercise  Current exercise habits: pt says very little Dietary issues discussed: pt reports a healthy diet   Depression Screen  Q1: Over the past two weeks, have you felt down, depressed or hopeless?no  Q2: Over the past two weeks, have you felt little interest or pleasure in doing things? no   The following portions of the patient's history were reviewed and updated as appropriate: allergies, current medications, past family history, past medical history, past social history, past surgical history and problem list.  Past Medical History  Diagnosis Date  . Chest pain   . Clostridium difficile colitis   . Coronary atherosclerosis of native coronary vessel   . Chronic renal insufficiency   . HLD (hyperlipidemia)   . DM2 (diabetes mellitus, type 2)   . HTN (hypertension)   . Acute cholecystitis   . Dyshidrosis   . Routine general medical examination at a health care facility   . Secondary hyperparathyroidism (of renal origin)   . Hypothyroidism   . Dizziness   . Cough   . Neck pain   . Neck mass   . GERD (gastroesophageal reflux disease)   . Urinary tract infection   . Urinary incontinence   . Gout   . Osteoporosis   . Allergic rhinitis   . Anemia   . Diabetes mellitus     Past  Surgical History  Procedure Laterality Date  . C-section (other)  1977  . L ankle surgery  1988  . Electrocardiogram  02/01/06  . Bone density  08/21/05  . Cholecystectomy    . Total left knee  2007    History   Social History  . Marital Status: Married    Spouse Name: N/A    Number of Children: N/A  . Years of Education: N/A   Occupational History  . Not on file.   Social History Main Topics  . Smoking status: Never Smoker   . Smokeless tobacco: Not on file  . Alcohol Use: No  . Drug Use: No  . Sexual Activity: Not on file   Other Topics Concern  . Not on file   Social History Narrative   Lives with husband and has lived in [place] for over 30 years.     Current Outpatient Prescriptions on File Prior to Visit  Medication Sig Dispense Refill  . allopurinol (ZYLOPRIM) 300 MG tablet TAKE 1 TABLET DAILY  90 tablet  3  . aspirin (ECOTRIN LOW STRENGTH) 81 MG EC tablet Take 81 mg by mouth daily.        Marland Kitchen atorvastatin (LIPITOR) 80 MG tablet Take 1 tablet (80 mg total) by mouth daily.  90 tablet  3  . bromocriptine (PARLODEL)  2.5 MG tablet Take 1 tablet (2.5 mg total) by mouth at bedtime.  30 tablet  11  . epoetin alfa (PROCRIT) 29562 UNIT/ML injection Inject 10,000 Units into the skin every 30 (thirty) days. Dr. Justin Mend      . Ferrous Sulfate (IRON) 325 (65 FE) MG TABS Take 1 tablet by mouth daily.       . furosemide (LASIX) 20 MG tablet TAKE 1 TABLET ONCE DAILY  90 tablet  1  . losartan-hydrochlorothiazide (HYZAAR) 100-25 MG per tablet Take 1 tablet by mouth daily.  90 tablet  3  . omeprazole (PRILOSEC) 20 MG capsule TAKE 1 CAPSULE DAILY  90 capsule  11  . ondansetron (ZOFRAN-ODT) 4 MG disintegrating tablet       . oxybutynin (DITROPAN) 5 MG tablet TAKE 1 TABLET DAILY  90 tablet  2  . sitaGLIPtin (JANUVIA) 100 MG tablet Take 0.5 tablets (50 mg total) by mouth daily.  15 tablet  11  . triamcinolone ointment (KENALOG) 0.1 % Apply topically 3 (three) times daily. Prn itching   454 g  1  . [DISCONTINUED] oxybutynin (DITROPAN) 5 MG tablet TAKE 1 TABLET DAILY  90 tablet  3   Current Facility-Administered Medications on File Prior to Visit  Medication Dose Route Frequency Provider Last Rate Last Dose  . epoetin alfa (EPOGEN,PROCRIT) injection 10,000 Units  10,000 Units Subcutaneous Q28 days Sherril Croon, MD        Allergies  Allergen Reactions  . Azithromycin   . Pioglitazone     REACTION: edema  . Sulfonamide Derivatives     REACTION: rash  . Tramadol     dizziness    Family History  Problem Relation Age of Onset  . Coronary artery disease Father   . Stroke Father   . Diabetes Father   . Diabetes Sister   . Stroke Sister   . 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  remembers 3/3 at 5 minutes.  excellent recall.  can easily read and write a sentence.  alert and oriented x 3.    Assessment:   Medicare wellness utd on preventive parameters    Plan:   During the course of the visit the patient was educated and counseled about appropriate screening and preventive services including:        Fall prevention   Screening mammography  Bone densitometry screening.   Diabetes screening.  Nutrition counseling.  Vaccines / LABS Zostavax / Pneumococcal Vaccine today.    Patient Instructions (the written plan) was given to the patient.   we discussed code status.  pt requests full code, but would not want to be started or maintained on artificial life-support measures if there was not a reasonable chance of recovery

## 2013-01-30 NOTE — Patient Instructions (Addendum)
please consider these measures for your health:  minimize alcohol.  do not use tobacco products.  have a colonoscopy at least every 10 years from age 77.  Women should have an annual mammogram from age 80.  keep firearms safely stored.  always use seat belts.  have working smoke alarms in your home.  see an eye doctor and dentist regularly.  never drive under the influence of alcohol or drugs (including prescription drugs).   it is critically important to prevent falling down (keep floor areas well-lit, dry, and free of loose objects.  If you have a cane, walker, or wheelchair, you should use it, even for short trips around the house.  Also, try not to rush).   [phone], to make an appointment for a mammogram.   Please come back for a follow-up appointment in 6 months.  Please call if you decide to do the colonoscopy test.  It reduces your chances of dying of cancer.

## 2013-01-31 DIAGNOSIS — Z1239 Encounter for other screening for malignant neoplasm of breast: Secondary | ICD-10-CM | POA: Insufficient documentation

## 2013-02-14 ENCOUNTER — Other Ambulatory Visit: Payer: Self-pay | Admitting: Endocrinology

## 2013-02-14 ENCOUNTER — Telehealth: Payer: Self-pay | Admitting: Endocrinology

## 2013-02-14 ENCOUNTER — Other Ambulatory Visit: Payer: Self-pay | Admitting: *Deleted

## 2013-02-14 MED ORDER — ATORVASTATIN CALCIUM 80 MG PO TABS: 80.0000 mg | ORAL_TABLET | Freq: Every day | ORAL | Status: DC

## 2013-02-14 NOTE — Telephone Encounter (Signed)
Rx sent to pharmacy   

## 2013-02-16 ENCOUNTER — Encounter (HOSPITAL_COMMUNITY)
Admission: RE | Admit: 2013-02-16 | Discharge: 2013-02-16 | Disposition: A | Discharge status: Home / Self Care | Payer: Medicare Other | Source: Ambulatory Visit | Attending: Nephrology | Admitting: Nephrology

## 2013-02-16 LAB — IRON AND TIBC
Iron: 66 ug/dL (ref 42–135)
Saturation Ratios: 30 % (ref 20–55)
TIBC: 221 ug/dL — ABNORMAL LOW (ref 250–470)
UIBC: 155 ug/dL (ref 125–400)

## 2013-02-16 LAB — POCT HEMOGLOBIN-HEMACUE: Hemoglobin: 11.3 g/dL — ABNORMAL LOW (ref 12.0–15.0)

## 2013-02-16 MED ORDER — EPOETIN ALFA 10000 UNIT/ML IJ SOLN
10000.0000 [IU] | INTRAMUSCULAR | Status: DC
  Administered 2013-02-16: 15:00:00 10000 [IU] via SUBCUTANEOUS

## 2013-02-16 MED ORDER — EPOETIN ALFA 10000 UNIT/ML IJ SOLN
INTRAMUSCULAR | Status: AC
  Filled 2013-02-16: qty 1

## 2013-02-20 ENCOUNTER — Other Ambulatory Visit: Payer: Self-pay | Admitting: Endocrinology

## 2013-03-07 ENCOUNTER — Other Ambulatory Visit: Payer: Self-pay | Admitting: Endocrinology

## 2013-03-14 ENCOUNTER — Other Ambulatory Visit (HOSPITAL_COMMUNITY): Payer: Self-pay | Admitting: *Deleted

## 2013-03-17 ENCOUNTER — Encounter (HOSPITAL_COMMUNITY)
Admission: RE | Admit: 2013-03-17 | Discharge: 2013-03-17 | Disposition: A | Discharge status: Home / Self Care | Payer: Medicare Other | Source: Ambulatory Visit | Attending: Nephrology | Admitting: Nephrology

## 2013-03-17 DIAGNOSIS — N184 Chronic kidney disease, stage 4 (severe): Secondary | ICD-10-CM | POA: Insufficient documentation

## 2013-03-17 DIAGNOSIS — D638 Anemia in other chronic diseases classified elsewhere: Secondary | ICD-10-CM | POA: Insufficient documentation

## 2013-03-17 LAB — IRON AND TIBC: UIBC: 118 ug/dL — ABNORMAL LOW (ref 125–400)

## 2013-03-17 LAB — POCT HEMOGLOBIN-HEMACUE: Hemoglobin: 11 g/dL — ABNORMAL LOW (ref 12.0–15.0)

## 2013-03-17 LAB — FERRITIN: Ferritin: 912 ng/mL — ABNORMAL HIGH (ref 10–291)

## 2013-03-17 MED ORDER — EPOETIN ALFA 10000 UNIT/ML IJ SOLN
INTRAMUSCULAR | Status: AC
  Administered 2013-03-17: 10000 [IU]
  Filled 2013-03-17: qty 1

## 2013-03-17 MED ORDER — EPOETIN ALFA 10000 UNIT/ML IJ SOLN: 10000.0000 [IU] | INTRAMUSCULAR | Status: DC

## 2013-04-17 ENCOUNTER — Encounter (HOSPITAL_COMMUNITY): Payer: Medicare Other

## 2013-04-24 ENCOUNTER — Encounter (HOSPITAL_COMMUNITY)
Admission: RE | Admit: 2013-04-24 | Discharge: 2013-04-24 | Disposition: A | Discharge status: Home / Self Care | Payer: Medicare Other | Source: Ambulatory Visit | Attending: Nephrology | Admitting: Nephrology

## 2013-04-24 DIAGNOSIS — N184 Chronic kidney disease, stage 4 (severe): Secondary | ICD-10-CM | POA: Diagnosis not present

## 2013-04-24 DIAGNOSIS — D638 Anemia in other chronic diseases classified elsewhere: Secondary | ICD-10-CM | POA: Insufficient documentation

## 2013-04-24 LAB — IRON AND TIBC
Iron: 72 ug/dL (ref 42–135)
Saturation Ratios: 38 % (ref 20–55)
TIBC: 189 ug/dL — AB (ref 250–470)
UIBC: 117 ug/dL — ABNORMAL LOW (ref 125–400)

## 2013-04-24 LAB — FERRITIN: FERRITIN: 747 ng/mL — AB (ref 10–291)

## 2013-04-24 MED ORDER — EPOETIN ALFA 10000 UNIT/ML IJ SOLN
10000.0000 [IU] | INTRAMUSCULAR | Status: DC
  Administered 2013-04-24: 15:00:00 10000 [IU] via SUBCUTANEOUS

## 2013-04-24 MED ORDER — EPOETIN ALFA 10000 UNIT/ML IJ SOLN
INTRAMUSCULAR | Status: AC
  Filled 2013-04-24: qty 1

## 2013-04-25 LAB — POCT HEMOGLOBIN-HEMACUE: Hemoglobin: 10.7 g/dL — ABNORMAL LOW (ref 12.0–15.0)

## 2013-05-01 ENCOUNTER — Ambulatory Visit (INDEPENDENT_AMBULATORY_CARE_PROVIDER_SITE_OTHER): Payer: Medicare Other | Admitting: Endocrinology

## 2013-05-01 ENCOUNTER — Encounter: Payer: Self-pay | Admitting: Endocrinology

## 2013-05-01 VITALS — BP 110/50 | HR 64 | Temp 97.7°F | Ht 62.0 in | Wt 251.0 lb

## 2013-05-01 DIAGNOSIS — J069 Acute upper respiratory infection, unspecified: Secondary | ICD-10-CM

## 2013-05-01 MED ORDER — CEFUROXIME AXETIL 250 MG PO TABS: 250.0000 mg | ORAL_TABLET | Freq: Two times a day (BID) | ORAL | Status: AC

## 2013-05-01 NOTE — Progress Notes (Signed)
Subjective:    Patient ID: Stacy Walls, female    DOB: 1932/12/18, 80 y.o.   MRN: [id number]  HPI Pt states few days of slight left nostril epistaxis, and assoc nasal congestion.   Past Medical History  Diagnosis Date  . Chest pain   . Clostridium difficile colitis   . Coronary atherosclerosis of native coronary vessel   . Chronic renal insufficiency   . HLD (hyperlipidemia)   . DM2 (diabetes mellitus, type 2)   . HTN (hypertension)   . Acute cholecystitis   . Dyshidrosis   . Routine general medical examination at a health care facility   . Secondary hyperparathyroidism (of renal origin)   . Hypothyroidism   . Dizziness   . Cough   . Neck pain   . Neck mass   . GERD (gastroesophageal reflux disease)   . Urinary tract infection   . Urinary incontinence   . Gout   . Osteoporosis   . Allergic rhinitis   . Anemia   . Diabetes mellitus     Past Surgical History  Procedure Laterality Date  . C-section (other)  1977  . L ankle surgery  1988  . Electrocardiogram  02/01/06  . Bone density  08/21/05  . Cholecystectomy    . Total left knee  2007    History   Social History  . Marital Status: Married    Spouse Name: N/A    Number of Children: N/A  . Years of Education: N/A   Occupational History  . Not on file.   Social History Main Topics  . Smoking status: Never Smoker   . Smokeless tobacco: Not on file  . Alcohol Use: No  . Drug Use: No  . Sexual Activity: Not on file   Other Topics Concern  . Not on file   Social History Narrative   Lives with husband and has lived in [place] for over 30 years.     Current Outpatient Prescriptions on File Prior to Visit  Medication Sig Dispense Refill  . allopurinol (ZYLOPRIM) 300 MG tablet TAKE 1 TABLET DAILY  90 tablet  3  . aspirin (ECOTRIN LOW STRENGTH) 81 MG EC tablet Take 81 mg by mouth daily.        Marland Kitchen atorvastatin (LIPITOR) 80 MG tablet Take 1 tablet (80 mg total) by mouth daily.  90 tablet  2  .  bromocriptine (PARLODEL) 2.5 MG tablet Take 1 tablet (2.5 mg total) by mouth at bedtime.  30 tablet  11  . epoetin alfa (PROCRIT) 09811 UNIT/ML injection Inject 10,000 Units into the skin every 30 (thirty) days. Dr. Justin Mend      . Ferrous Sulfate (IRON) 325 (65 FE) MG TABS Take 1 tablet by mouth daily.       . furosemide (LASIX) 20 MG tablet TAKE 1 TABLET BY MOUTH DAILY  90 tablet  3  . JANUVIA 100 MG tablet TAKE 1/2 TABLET BY MOUTH EVERY DAY  15 tablet  3  . losartan-hydrochlorothiazide (HYZAAR) 100-25 MG per tablet Take 1 tablet by mouth daily.  90 tablet  3  . omeprazole (PRILOSEC) 20 MG capsule TAKE 1 CAPSULE DAILY  90 capsule  11  . oxybutynin (DITROPAN) 5 MG tablet TAKE 1 TABLET DAILY  90 tablet  2  . triamcinolone ointment (KENALOG) 0.1 % Apply topically 3 (three) times daily. Prn itching  454 g  1  . [DISCONTINUED] oxybutynin (DITROPAN) 5 MG tablet TAKE 1 TABLET DAILY  90 tablet  3   Current Facility-Administered Medications on File Prior to Visit  Medication Dose Route Frequency Provider Last Rate Last Dose  . epoetin alfa (EPOGEN,PROCRIT) injection 10,000 Units  10,000 Units Subcutaneous Q28 days Sherril Croon, MD        Allergies  Allergen Reactions  . Azithromycin   . Pioglitazone     REACTION: edema  . Sulfonamide Derivatives     REACTION: rash  . Tramadol     dizziness    Family History  Problem Relation Age of Onset  . Coronary artery disease Father   . Stroke Father   . Diabetes Father   . Diabetes Sister   . Stroke Sister   . Diabetes Brother     BP 110/50  Pulse 64  Temp(Src) 97.7 F (36.5 C) (Oral)  Ht 5\' 2"  (1.575 m)  Wt 251 lb (113.853 kg)  BMI 45.90 kg/m2  SpO2 92%  Review of Systems Denies otalgia and fever    Objective:   Physical Exam VITAL SIGNS:  See vs page GENERAL: no distress head: no deformity eyes: no periorbital swelling, no proptosis external nose and ears are normal mouth: no lesion seen head: no deformity eyes: no periorbital  swelling, no proptosis external nose and ears are normal mouth: no lesion seen LUNGS:  Clear to auscultation.        Assessment & Plan:  URI: new Epistaxis: new.  usually weather-related.  No rx needed

## 2013-05-01 NOTE — Patient Instructions (Addendum)
Loratadine-d (non-prescription) will help your congestion. i have sent a prescription to your pharmacy, for an antibiotic pill. I hope you feel better soon.  If you don't feel better by next week, please call back.  Please call sooner if you get worse.

## 2013-05-25 ENCOUNTER — Encounter (HOSPITAL_COMMUNITY)
Admission: RE | Admit: 2013-05-25 | Discharge: 2013-05-25 | Disposition: A | Discharge status: Home / Self Care | Payer: Medicare Other | Source: Ambulatory Visit | Attending: Nephrology | Admitting: Nephrology

## 2013-05-25 DIAGNOSIS — N184 Chronic kidney disease, stage 4 (severe): Secondary | ICD-10-CM | POA: Diagnosis not present

## 2013-05-25 DIAGNOSIS — D638 Anemia in other chronic diseases classified elsewhere: Secondary | ICD-10-CM | POA: Insufficient documentation

## 2013-05-25 LAB — IRON AND TIBC
Iron: 76 ug/dL (ref 42–135)
SATURATION RATIOS: 37 % (ref 20–55)
TIBC: 204 ug/dL — ABNORMAL LOW (ref 250–470)
UIBC: 128 ug/dL (ref 125–400)

## 2013-05-25 LAB — FERRITIN: Ferritin: 976 ng/mL — ABNORMAL HIGH (ref 10–291)

## 2013-05-25 LAB — POCT HEMOGLOBIN-HEMACUE: Hemoglobin: 11.3 g/dL — ABNORMAL LOW (ref 12.0–15.0)

## 2013-05-25 MED ORDER — EPOETIN ALFA 10000 UNIT/ML IJ SOLN
10000.0000 [IU] | INTRAMUSCULAR | Status: DC
  Administered 2013-05-25: 10000 [IU] via SUBCUTANEOUS

## 2013-05-25 MED ORDER — EPOETIN ALFA 10000 UNIT/ML IJ SOLN
INTRAMUSCULAR | Status: AC
  Filled 2013-05-25: qty 1

## 2013-06-22 ENCOUNTER — Inpatient Hospital Stay (HOSPITAL_COMMUNITY): Admission: RE | Admit: 2013-06-22 | Payer: Medicare Other | Source: Ambulatory Visit

## 2013-06-27 ENCOUNTER — Encounter (HOSPITAL_COMMUNITY)
Admission: RE | Admit: 2013-06-27 | Discharge: 2013-06-27 | Disposition: A | Discharge status: Home / Self Care | Payer: Medicare Other | Source: Ambulatory Visit | Attending: Nephrology | Admitting: Nephrology

## 2013-06-27 DIAGNOSIS — D638 Anemia in other chronic diseases classified elsewhere: Secondary | ICD-10-CM | POA: Diagnosis not present

## 2013-06-27 DIAGNOSIS — N184 Chronic kidney disease, stage 4 (severe): Secondary | ICD-10-CM | POA: Diagnosis not present

## 2013-06-27 LAB — POCT HEMOGLOBIN-HEMACUE: Hemoglobin: 10.7 g/dL — ABNORMAL LOW (ref 12.0–15.0)

## 2013-06-27 LAB — IRON AND TIBC
IRON: 57 ug/dL (ref 42–135)
SATURATION RATIOS: 28 % (ref 20–55)
TIBC: 206 ug/dL — ABNORMAL LOW (ref 250–470)
UIBC: 149 ug/dL (ref 125–400)

## 2013-06-27 LAB — FERRITIN: FERRITIN: 882 ng/mL — AB (ref 10–291)

## 2013-06-27 MED ORDER — EPOETIN ALFA 10000 UNIT/ML IJ SOLN
INTRAMUSCULAR | Status: AC
  Filled 2013-06-27: qty 1

## 2013-06-27 MED ORDER — EPOETIN ALFA 10000 UNIT/ML IJ SOLN
10000.0000 [IU] | INTRAMUSCULAR | Status: DC
  Administered 2013-06-27: 10000 [IU] via SUBCUTANEOUS

## 2013-07-16 ENCOUNTER — Emergency Department (INDEPENDENT_AMBULATORY_CARE_PROVIDER_SITE_OTHER)
Admission: EM | Admit: 2013-07-16 | Discharge: 2013-07-16 | Disposition: A | Discharge status: Home / Self Care | Payer: Medicare Other | Source: Home / Self Care | Attending: Family Medicine | Admitting: Family Medicine

## 2013-07-16 ENCOUNTER — Encounter (HOSPITAL_COMMUNITY): Payer: Self-pay | Admitting: Emergency Medicine

## 2013-07-16 DIAGNOSIS — M25559 Pain in unspecified hip: Secondary | ICD-10-CM | POA: Diagnosis not present

## 2013-07-16 DIAGNOSIS — G44209 Tension-type headache, unspecified, not intractable: Secondary | ICD-10-CM

## 2013-07-16 DIAGNOSIS — N184 Chronic kidney disease, stage 4 (severe): Secondary | ICD-10-CM | POA: Diagnosis not present

## 2013-07-16 DIAGNOSIS — M25552 Pain in left hip: Secondary | ICD-10-CM

## 2013-07-16 DIAGNOSIS — D638 Anemia in other chronic diseases classified elsewhere: Secondary | ICD-10-CM | POA: Diagnosis not present

## 2013-07-16 LAB — POCT URINALYSIS DIP (DEVICE)
BILIRUBIN URINE: NEGATIVE
Glucose, UA: NEGATIVE mg/dL
Hgb urine dipstick: NEGATIVE
Ketones, ur: NEGATIVE mg/dL
NITRITE: NEGATIVE
PH: 5.5 (ref 5.0–8.0)
PROTEIN: NEGATIVE mg/dL
Specific Gravity, Urine: 1.01 (ref 1.005–1.030)
UROBILINOGEN UA: 0.2 mg/dL (ref 0.0–1.0)

## 2013-07-16 NOTE — ED Provider Notes (Signed)
CSN: [id number]     Arrival date & time 07/16/13  1551 History   First MD Initiated Contact with Patient 07/16/13 1640     Chief Complaint  Patient presents with  . Urinary Tract Infection   (Consider location/radiation/quality/duration/timing/severity/associated sxs/prior Treatment) HPI Comments: Patient presents to [facility] with reports of intermittent left hip and pelvis discomfort over past few days and wishes to make sure she is not experiencing a UTI. Also mentions she has also had occasional right temporal headache without additional associated symptom.  No recent illness or injury.   The history is provided by the patient.    Past Medical History  Diagnosis Date  . Chest pain   . Clostridium difficile colitis   . Coronary atherosclerosis of native coronary vessel   . Chronic renal insufficiency   . HLD (hyperlipidemia)   . DM2 (diabetes mellitus, type 2)   . HTN (hypertension)   . Acute cholecystitis   . Dyshidrosis   . Routine general medical examination at a health care facility   . Secondary hyperparathyroidism (of renal origin)   . Hypothyroidism   . Dizziness   . Cough   . Neck pain   . Neck mass   . GERD (gastroesophageal reflux disease)   . Urinary tract infection   . Urinary incontinence   . Gout   . Osteoporosis   . Allergic rhinitis   . Anemia   . Diabetes mellitus    Past Surgical History  Procedure Laterality Date  . C-section (other)  1977  . L ankle surgery  1988  . Electrocardiogram  02/01/06  . Bone density  08/21/05  . Cholecystectomy    . Total left knee  2007   Family History  Problem Relation Age of Onset  . Coronary artery disease Father   . Stroke Father   . Diabetes Father   . Diabetes Sister   . Stroke Sister   . Diabetes Brother    History  Substance Use Topics  . Smoking status: Never Smoker   . Smokeless tobacco: Not on file  . Alcohol Use: No   OB History   Grav Para Term Preterm Abortions TAB SAB Ect Mult Living          Review of Systems  Constitutional: Negative.   Eyes: Negative.   Respiratory: Negative.   Cardiovascular: Negative.   Gastrointestinal: Negative.        See HPI  Endocrine: Negative.   Genitourinary: Negative.   Musculoskeletal:       Left hip discomfort  Skin: Negative.   Allergic/Immunologic: Negative for immunocompromised state.  Neurological: Positive for headaches. Negative for dizziness, tremors, seizures, syncope, facial asymmetry, speech difficulty, weakness, light-headedness and numbness.  Psychiatric/Behavioral: Negative for confusion.    Allergies  Azithromycin; Pioglitazone; Sulfonamide derivatives; and Tramadol  Home Medications   Current Outpatient Rx  Name  Route  Sig  Dispense  Refill  . allopurinol (ZYLOPRIM) 300 MG tablet      TAKE 1 TABLET DAILY   90 tablet   3   . aspirin (ECOTRIN LOW STRENGTH) 81 MG EC tablet   Oral   Take 81 mg by mouth daily.           Marland Kitchen atorvastatin (LIPITOR) 80 MG tablet   Oral   Take 1 tablet (80 mg total) by mouth daily.   90 tablet   2   . bromocriptine (PARLODEL) 2.5 MG tablet   Oral   Take 1 tablet (2.5 mg total)  by mouth at bedtime.   30 tablet   11   . epoetin alfa (PROCRIT) 24401 UNIT/ML injection   Subcutaneous   Inject 10,000 Units into the skin every 30 (thirty) days. Dr. Justin Mend         . Ferrous Sulfate (IRON) 325 (65 FE) MG TABS   Oral   Take 1 tablet by mouth daily.          . furosemide (LASIX) 20 MG tablet      TAKE 1 TABLET BY MOUTH DAILY   90 tablet   3   . JANUVIA 100 MG tablet      TAKE 1/2 TABLET BY MOUTH EVERY DAY   15 tablet   3   . losartan-hydrochlorothiazide (HYZAAR) 100-25 MG per tablet   Oral   Take 1 tablet by mouth daily.   90 tablet   3   . omeprazole (PRILOSEC) 20 MG capsule      TAKE 1 CAPSULE DAILY   90 capsule   11   . oxybutynin (DITROPAN) 5 MG tablet      TAKE 1 TABLET DAILY   90 tablet   2   . triamcinolone ointment (KENALOG) 0.1 %    Topical   Apply topically 3 (three) times daily. Prn itching   454 g   1    BP 145/98  Pulse 92  Temp(Src) 98.7 F (37.1 C) (Oral)  Resp 19  SpO2 99% Physical Exam  Nursing note and vitals reviewed. Constitutional: She is oriented to person, place, and time. She appears well-developed and well-nourished. No distress.  Morbid obesity  HENT:  Head: Normocephalic and atraumatic.  Right Ear: Hearing, tympanic membrane, external ear and ear canal normal.  Left Ear: Hearing, tympanic membrane, external ear and ear canal normal.  Nose: Nose normal.  Mouth/Throat: Uvula is midline, oropharynx is clear and moist and mucous membranes are normal.  No discomfort with palpation of temporal areas to suggest temporal arteritis.  Eyes: Conjunctivae, EOM and lids are normal. Pupils are equal, round, and reactive to light. Right eye exhibits no discharge. Left eye exhibits no discharge. No scleral icterus.  Neck: Normal range of motion. Neck supple.  Cardiovascular: Normal rate, regular rhythm and normal heart sounds.   Pulmonary/Chest: Effort normal and breath sounds normal. No respiratory distress. She has no wheezes.  Abdominal: Soft. Bowel sounds are normal. She exhibits no distension. There is no tenderness. There is no CVA tenderness. Hernia confirmed negative in the ventral area and confirmed negative in the left inguinal area.  Examination somewhat limited by patient's body habitus  Musculoskeletal: Normal range of motion.       Left hip: Normal.  Reports area of tenderness at left lateral hip and left anterior iliac spine, but we are unable to reproduce any discomfort during today's examination.  Lymphadenopathy:    She has no cervical adenopathy.  Neurological: She is alert and oriented to person, place, and time. She has normal strength. No cranial nerve deficit or sensory deficit. Gait normal. GCS eye subscore is 4. GCS verbal subscore is 5. GCS motor subscore is 6.  Skin: Skin is warm  and dry. No rash noted. No erythema.  Psychiatric: She has a normal mood and affect. Her behavior is normal.    ED Course  Procedures (including critical care time) Labs Review Labs Reviewed  POCT URINALYSIS DIP (DEVICE) - Abnormal; Notable for the following:    Leukocytes, UA TRACE (*)    All other components within  normal limits  URINE CULTURE   Imaging Review No results found.   MDM   1. Left hip pain   2. Headache, tension-type    Nonspecific headache: advised the use of tylenol for discomfort Left hip discomfort: I suspect this is related to OA as patient has already undergone left TKR for OA and is scheduled to have right TKR for same. UA normal. Advised follow up with orthopedist if symptoms persist.    Lahoma Rocker, PA 07/16/13 2050

## 2013-07-16 NOTE — ED Notes (Signed)
C/o UTI  States she has lower left side and back pain States she is nausea No tx done

## 2013-07-16 NOTE — Discharge Instructions (Signed)
If your urine culture indicates that you need treatment, you will be notified by phone and medication will called in to your pharmacy. It does not appear that you have a urinary tract infection today. You may use tylenol as directed on packaging should you have have additional headaches. If symptoms become suddenly worse or severe, please seek immediate medical attention.

## 2013-07-17 LAB — URINE CULTURE: Colony Count: 30000

## 2013-07-19 NOTE — ED Provider Notes (Signed)
Medical screening examination/treatment/procedure(s) were performed by a resident physician or non-physician practitioner and as the supervising physician I was immediately available for consultation/collaboration.  Lynne Leader, MD   Gregor Hams, MD 07/19/13 [phone]

## 2013-07-20 ENCOUNTER — Encounter (HOSPITAL_COMMUNITY)
Admission: RE | Admit: 2013-07-20 | Discharge: 2013-07-20 | Disposition: A | Discharge status: Home / Self Care | Payer: Medicare Other | Source: Ambulatory Visit | Attending: Nephrology | Admitting: Nephrology

## 2013-07-20 DIAGNOSIS — N184 Chronic kidney disease, stage 4 (severe): Secondary | ICD-10-CM | POA: Insufficient documentation

## 2013-07-20 DIAGNOSIS — D638 Anemia in other chronic diseases classified elsewhere: Secondary | ICD-10-CM | POA: Insufficient documentation

## 2013-07-20 LAB — IRON AND TIBC
IRON: 56 ug/dL (ref 42–135)
SATURATION RATIOS: 27 % (ref 20–55)
TIBC: 204 ug/dL — AB (ref 250–470)
UIBC: 148 ug/dL (ref 125–400)

## 2013-07-20 LAB — POCT HEMOGLOBIN-HEMACUE: Hemoglobin: 10.8 g/dL — ABNORMAL LOW (ref 12.0–15.0)

## 2013-07-20 LAB — FERRITIN: Ferritin: 812 ng/mL — ABNORMAL HIGH (ref 10–291)

## 2013-07-20 MED ORDER — EPOETIN ALFA 10000 UNIT/ML IJ SOLN
10000.0000 [IU] | INTRAMUSCULAR | Status: DC
  Administered 2013-07-20: 10000 [IU] via SUBCUTANEOUS

## 2013-07-20 MED ORDER — EPOETIN ALFA 10000 UNIT/ML IJ SOLN
INTRAMUSCULAR | Status: AC
  Administered 2013-07-20: 10000 [IU] via SUBCUTANEOUS
  Filled 2013-07-20: qty 1

## 2013-07-31 ENCOUNTER — Ambulatory Visit: Payer: Medicare Other | Admitting: Endocrinology

## 2013-08-01 ENCOUNTER — Encounter: Payer: Self-pay | Admitting: Endocrinology

## 2013-08-01 ENCOUNTER — Ambulatory Visit (INDEPENDENT_AMBULATORY_CARE_PROVIDER_SITE_OTHER): Payer: Medicare Other | Admitting: Endocrinology

## 2013-08-01 ENCOUNTER — Other Ambulatory Visit: Payer: Self-pay | Admitting: Endocrinology

## 2013-08-01 VITALS — BP 124/70 | HR 65 | Temp 98.4°F | Ht 62.0 in | Wt 246.0 lb

## 2013-08-01 DIAGNOSIS — E1129 Type 2 diabetes mellitus with other diabetic kidney complication: Secondary | ICD-10-CM | POA: Diagnosis not present

## 2013-08-01 LAB — HEMOGLOBIN A1C: Hgb A1c MFr Bld: 6 % (ref 4.6–6.5)

## 2013-08-01 MED ORDER — AMOXICILLIN 250 MG PO CAPS: 250.0000 mg | ORAL_CAPSULE | Freq: Three times a day (TID) | ORAL | Status: DC

## 2013-08-01 MED ORDER — BENZONATATE 100 MG PO CAPS: 100.0000 mg | ORAL_CAPSULE | Freq: Three times a day (TID) | ORAL | Status: DC | PRN

## 2013-08-01 MED ORDER — FLUTICASONE PROPIONATE 50 MCG/ACT NA SUSP: 2.0000 | Freq: Every day | NASAL | Status: DC

## 2013-08-01 NOTE — Progress Notes (Signed)
Subjective:    Patient ID: Stacy Walls, female    DOB: 1933/01/23, 80 y.o.   MRN: [id number]  HPI Pt states 1 week of slight congestion in the nose, and assoc rhinorrhea. Past Medical History  Diagnosis Date  . Chest pain   . Clostridium difficile colitis   . Coronary atherosclerosis of native coronary vessel   . Chronic renal insufficiency   . HLD (hyperlipidemia)   . DM2 (diabetes mellitus, type 2)   . HTN (hypertension)   . Acute cholecystitis   . Dyshidrosis   . Routine general medical examination at a health care facility   . Secondary hyperparathyroidism (of renal origin)   . Hypothyroidism   . Dizziness   . Cough   . Neck pain   . Neck mass   . GERD (gastroesophageal reflux disease)   . Urinary tract infection   . Urinary incontinence   . Gout   . Osteoporosis   . Allergic rhinitis   . Anemia   . Diabetes mellitus     Past Surgical History  Procedure Laterality Date  . C-section (other)  1977  . L ankle surgery  1988  . Electrocardiogram  02/01/06  . Bone density  08/21/05  . Cholecystectomy    . Total left knee  2007    History   Social History  . Marital Status: Married    Spouse Name: N/A    Number of Children: N/A  . Years of Education: N/A   Occupational History  . Not on file.   Social History Main Topics  . Smoking status: Never Smoker   . Smokeless tobacco: Not on file  . Alcohol Use: No  . Drug Use: No  . Sexual Activity: Not on file   Other Topics Concern  . Not on file   Social History Narrative   Lives with husband and has lived in [place] for over 30 years.     Current Outpatient Prescriptions on File Prior to Visit  Medication Sig Dispense Refill  . allopurinol (ZYLOPRIM) 300 MG tablet TAKE 1 TABLET DAILY  90 tablet  3  . aspirin (ECOTRIN LOW STRENGTH) 81 MG EC tablet Take 81 mg by mouth daily.        Marland Kitchen atorvastatin (LIPITOR) 80 MG tablet Take 1 tablet (80 mg total) by mouth daily.  90 tablet  2  . bromocriptine  (PARLODEL) 2.5 MG tablet Take 1 tablet (2.5 mg total) by mouth at bedtime.  30 tablet  11  . epoetin alfa (PROCRIT) 57846 UNIT/ML injection Inject 10,000 Units into the skin every 30 (thirty) days. Dr. Justin Mend      . Ferrous Sulfate (IRON) 325 (65 FE) MG TABS Take 1 tablet by mouth daily.       . furosemide (LASIX) 20 MG tablet TAKE 1 TABLET BY MOUTH DAILY  90 tablet  3  . JANUVIA 100 MG tablet TAKE 1/2 TABLET BY MOUTH EVERY DAY  15 tablet  3  . losartan-hydrochlorothiazide (HYZAAR) 100-25 MG per tablet Take 1 tablet by mouth daily.  90 tablet  3  . omeprazole (PRILOSEC) 20 MG capsule TAKE 1 CAPSULE DAILY  90 capsule  11  . oxybutynin (DITROPAN) 5 MG tablet TAKE 1 TABLET DAILY  90 tablet  2  . triamcinolone ointment (KENALOG) 0.1 % Apply topically 3 (three) times daily. Prn itching  454 g  1  . [DISCONTINUED] oxybutynin (DITROPAN) 5 MG tablet TAKE 1 TABLET DAILY  90 tablet  3  No current facility-administered medications on file prior to visit.    Allergies  Allergen Reactions  . Azithromycin   . Pioglitazone     REACTION: edema  . Sulfonamide Derivatives     REACTION: rash  . Tramadol     dizziness    Family History  Problem Relation Age of Onset  . Coronary artery disease Father   . Stroke Father   . Diabetes Father   . Diabetes Sister   . Stroke Sister   . Diabetes Brother     BP 124/70  Pulse 65  Temp(Src) 98.4 F (36.9 C) (Oral)  Ht 5\' 2"  (1.575 m)  Wt 246 lb (111.585 kg)  BMI 44.98 kg/m2  SpO2 96%   Review of Systems She has a slight dry cough.  She has slight edema    Objective:   Physical Exam VITAL SIGNS:  See vs page GENERAL: no distress.  Obese. head: no deformity eyes: no periorbital swelling, no proptosis external nose and ears are normal mouth: no lesion seen Both eac's and tm's are normal   Lab Results  Component Value Date   HGBA1C 6.0 08/01/2013       Assessment & Plan:  URI: new Allergic rhinitis: recurrent DM: well-controlled.

## 2013-08-01 NOTE — Patient Instructions (Addendum)
Please come back for a "medicare wellness" appointment in 6 months.  blood tests are being requested for you today.  We'll contact you with results. i have sent 3 prescriptions to your pharmacy, for a nasal spray, cough pill,and antibiotic

## 2013-08-17 ENCOUNTER — Encounter (HOSPITAL_COMMUNITY)
Admission: RE | Admit: 2013-08-17 | Discharge: 2013-08-17 | Disposition: A | Discharge status: Home / Self Care | Payer: Medicare Other | Source: Ambulatory Visit | Attending: Nephrology | Admitting: Nephrology

## 2013-08-17 LAB — POCT HEMOGLOBIN-HEMACUE: Hemoglobin: 10.8 g/dL — ABNORMAL LOW (ref 12.0–15.0)

## 2013-08-17 MED ORDER — EPOETIN ALFA 10000 UNIT/ML IJ SOLN
INTRAMUSCULAR | Status: AC
  Administered 2013-08-17: 10000 [IU] via SUBCUTANEOUS
  Filled 2013-08-17: qty 1

## 2013-08-17 MED ORDER — EPOETIN ALFA 10000 UNIT/ML IJ SOLN
10000.0000 [IU] | INTRAMUSCULAR | Status: DC
  Administered 2013-08-17: 10000 [IU] via SUBCUTANEOUS

## 2013-08-23 ENCOUNTER — Telehealth: Payer: Self-pay | Admitting: Endocrinology

## 2013-08-23 ENCOUNTER — Other Ambulatory Visit: Payer: Self-pay | Admitting: Endocrinology

## 2013-08-23 MED ORDER — ALLOPURINOL 300 MG PO TABS: ORAL_TABLET | ORAL | Status: DC

## 2013-08-23 NOTE — Telephone Encounter (Signed)
Patient states she needs her Allopurinol refilled  Also oxybutynin refilled    Thank You :)

## 2013-08-23 NOTE — Telephone Encounter (Signed)
Medications refilled

## 2013-08-25 ENCOUNTER — Other Ambulatory Visit: Payer: Self-pay | Admitting: Endocrinology

## 2013-08-25 MED ORDER — SITAGLIPTIN PHOSPHATE 100 MG PO TABS: ORAL_TABLET | ORAL | Status: DC

## 2013-08-25 MED ORDER — BROMOCRIPTINE MESYLATE 2.5 MG PO TABS: 2.5000 mg | ORAL_TABLET | Freq: Every day | ORAL | Status: DC

## 2013-08-25 NOTE — Telephone Encounter (Signed)
Rx sent to pharmacy   

## 2013-08-25 NOTE — Telephone Encounter (Signed)
Patient states she needs Januvia and bromocriptine  Also she needs her [facility] mail order  Thank You :)

## 2013-08-28 ENCOUNTER — Telehealth: Payer: Self-pay | Admitting: Endocrinology

## 2013-08-28 NOTE — Telephone Encounter (Signed)
Medication sent 08/23/2013.

## 2013-08-28 NOTE — Telephone Encounter (Signed)
Patient would like her Oxybutnin sent to [place]  Thank You :)

## 2013-09-14 ENCOUNTER — Inpatient Hospital Stay (HOSPITAL_COMMUNITY): Admission: RE | Admit: 2013-09-14 | Payer: Medicare Other | Source: Ambulatory Visit

## 2013-09-19 ENCOUNTER — Encounter (HOSPITAL_COMMUNITY)
Admission: RE | Admit: 2013-09-19 | Discharge: 2013-09-19 | Disposition: A | Discharge status: Home / Self Care | Payer: Medicare Other | Source: Ambulatory Visit | Attending: Nephrology | Admitting: Nephrology

## 2013-09-19 DIAGNOSIS — D638 Anemia in other chronic diseases classified elsewhere: Secondary | ICD-10-CM | POA: Diagnosis not present

## 2013-09-19 DIAGNOSIS — N184 Chronic kidney disease, stage 4 (severe): Secondary | ICD-10-CM | POA: Diagnosis not present

## 2013-09-19 LAB — IRON AND TIBC
Iron: 77 ug/dL (ref 42–135)
Saturation Ratios: 39 % (ref 20–55)
TIBC: 199 ug/dL — ABNORMAL LOW (ref 250–470)
UIBC: 122 ug/dL — ABNORMAL LOW (ref 125–400)

## 2013-09-19 LAB — POCT HEMOGLOBIN-HEMACUE: Hemoglobin: 10.7 g/dL — ABNORMAL LOW (ref 12.0–15.0)

## 2013-09-19 LAB — FERRITIN: FERRITIN: 897 ng/mL — AB (ref 10–291)

## 2013-09-19 MED ORDER — EPOETIN ALFA 10000 UNIT/ML IJ SOLN
10000.0000 [IU] | INTRAMUSCULAR | Status: DC
  Administered 2013-09-19: 10000 [IU] via SUBCUTANEOUS

## 2013-09-19 MED ORDER — EPOETIN ALFA 10000 UNIT/ML IJ SOLN
INTRAMUSCULAR | Status: AC
  Filled 2013-09-19: qty 1

## 2013-09-26 DIAGNOSIS — N2581 Secondary hyperparathyroidism of renal origin: Secondary | ICD-10-CM | POA: Diagnosis not present

## 2013-09-26 DIAGNOSIS — D631 Anemia in chronic kidney disease: Secondary | ICD-10-CM | POA: Diagnosis not present

## 2013-09-26 DIAGNOSIS — N039 Chronic nephritic syndrome with unspecified morphologic changes: Secondary | ICD-10-CM | POA: Diagnosis not present

## 2013-09-26 DIAGNOSIS — N183 Chronic kidney disease, stage 3 unspecified: Secondary | ICD-10-CM | POA: Diagnosis not present

## 2013-10-17 ENCOUNTER — Encounter (HOSPITAL_COMMUNITY)
Admission: RE | Admit: 2013-10-17 | Discharge: 2013-10-17 | Disposition: A | Discharge status: Home / Self Care | Payer: Medicare Other | Source: Ambulatory Visit | Attending: Nephrology | Admitting: Nephrology

## 2013-10-17 DIAGNOSIS — D638 Anemia in other chronic diseases classified elsewhere: Secondary | ICD-10-CM | POA: Insufficient documentation

## 2013-10-17 DIAGNOSIS — N183 Chronic kidney disease, stage 3 unspecified: Secondary | ICD-10-CM | POA: Diagnosis not present

## 2013-10-17 LAB — IRON AND TIBC
IRON: 71 ug/dL (ref 42–135)
Saturation Ratios: 33 % (ref 20–55)
TIBC: 215 ug/dL — AB (ref 250–470)
UIBC: 144 ug/dL (ref 125–400)

## 2013-10-17 LAB — POCT HEMOGLOBIN-HEMACUE: Hemoglobin: 10.5 g/dL — ABNORMAL LOW (ref 12.0–15.0)

## 2013-10-17 LAB — FERRITIN: Ferritin: 875 ng/mL — ABNORMAL HIGH (ref 10–291)

## 2013-10-17 MED ORDER — EPOETIN ALFA 10000 UNIT/ML IJ SOLN
10000.0000 [IU] | INTRAMUSCULAR | Status: DC
  Administered 2013-10-17: 10000 [IU] via SUBCUTANEOUS

## 2013-10-17 MED ORDER — EPOETIN ALFA 10000 UNIT/ML IJ SOLN
INTRAMUSCULAR | Status: AC
  Filled 2013-10-17: qty 1

## 2013-11-13 ENCOUNTER — Telehealth: Payer: Self-pay | Admitting: Endocrinology

## 2013-11-13 MED ORDER — OMEPRAZOLE 20 MG PO CPDR: DELAYED_RELEASE_CAPSULE | ORAL | Status: DC

## 2013-11-13 NOTE — Telephone Encounter (Signed)
Patient would like her Omeprazole called in  Pharmacy: [facility]   Thank you

## 2013-11-13 NOTE — Telephone Encounter (Signed)
Rx sent to pharmacy   

## 2013-11-16 ENCOUNTER — Encounter (HOSPITAL_COMMUNITY)
Admission: RE | Admit: 2013-11-16 | Discharge: 2013-11-16 | Disposition: A | Discharge status: Home / Self Care | Payer: Medicare Other | Source: Ambulatory Visit | Attending: Nephrology | Admitting: Nephrology

## 2013-11-16 DIAGNOSIS — N184 Chronic kidney disease, stage 4 (severe): Secondary | ICD-10-CM | POA: Insufficient documentation

## 2013-11-16 DIAGNOSIS — D638 Anemia in other chronic diseases classified elsewhere: Secondary | ICD-10-CM | POA: Diagnosis not present

## 2013-11-16 LAB — POCT HEMOGLOBIN-HEMACUE: HEMOGLOBIN: 11.1 g/dL — AB (ref 12.0–15.0)

## 2013-11-16 LAB — IRON AND TIBC
Iron: 67 ug/dL (ref 42–135)
SATURATION RATIOS: 32 % (ref 20–55)
TIBC: 207 ug/dL — ABNORMAL LOW (ref 250–470)
UIBC: 140 ug/dL (ref 125–400)

## 2013-11-16 MED ORDER — EPOETIN ALFA 10000 UNIT/ML IJ SOLN
10000.0000 [IU] | INTRAMUSCULAR | Status: DC
  Administered 2013-11-16: 10000 [IU] via SUBCUTANEOUS

## 2013-11-16 MED ORDER — EPOETIN ALFA 10000 UNIT/ML IJ SOLN
INTRAMUSCULAR | Status: DC
Start: 2013-11-16 — End: 2013-11-17
  Filled 2013-11-16: qty 1

## 2013-11-17 LAB — FERRITIN: Ferritin: 842 ng/mL — ABNORMAL HIGH (ref 10–291)

## 2013-11-22 ENCOUNTER — Other Ambulatory Visit: Payer: Self-pay | Admitting: Endocrinology

## 2013-11-27 MED ORDER — ATORVASTATIN CALCIUM 80 MG PO TABS: 80.0000 mg | ORAL_TABLET | Freq: Every day | ORAL | Status: DC

## 2013-11-27 NOTE — Telephone Encounter (Signed)
Patient need a refill on Atorvastatin 80 mg, send to [facility] on [address]

## 2013-11-27 NOTE — Telephone Encounter (Signed)
Rx sent to pharmacy   

## 2013-12-14 ENCOUNTER — Encounter (HOSPITAL_COMMUNITY)
Admission: RE | Admit: 2013-12-14 | Discharge: 2013-12-14 | Disposition: A | Discharge status: Home / Self Care | Payer: Medicare Other | Source: Ambulatory Visit | Attending: Nephrology | Admitting: Nephrology

## 2013-12-14 DIAGNOSIS — D638 Anemia in other chronic diseases classified elsewhere: Secondary | ICD-10-CM | POA: Diagnosis not present

## 2013-12-14 DIAGNOSIS — N184 Chronic kidney disease, stage 4 (severe): Secondary | ICD-10-CM | POA: Insufficient documentation

## 2013-12-14 LAB — IRON AND TIBC
Iron: 72 ug/dL (ref 42–135)
Saturation Ratios: 36 % (ref 20–55)
TIBC: 200 ug/dL — ABNORMAL LOW (ref 250–470)
UIBC: 128 ug/dL (ref 125–400)

## 2013-12-14 LAB — FERRITIN: FERRITIN: 582 ng/mL — AB (ref 10–291)

## 2013-12-14 LAB — POCT HEMOGLOBIN-HEMACUE: Hemoglobin: 10.8 g/dL — ABNORMAL LOW (ref 12.0–15.0)

## 2013-12-14 MED ORDER — EPOETIN ALFA 10000 UNIT/ML IJ SOLN
INTRAMUSCULAR | Status: AC
Start: 2013-12-14 — End: 2013-12-14
  Administered 2013-12-14: 10000 [IU] via SUBCUTANEOUS
  Filled 2013-12-14: qty 1

## 2013-12-14 MED ORDER — EPOETIN ALFA 10000 UNIT/ML IJ SOLN
10000.0000 [IU] | INTRAMUSCULAR | Status: DC
  Administered 2013-12-14: 10000 [IU] via SUBCUTANEOUS

## 2013-12-26 ENCOUNTER — Other Ambulatory Visit: Payer: Self-pay | Admitting: Endocrinology

## 2014-01-11 ENCOUNTER — Inpatient Hospital Stay (HOSPITAL_COMMUNITY): Admission: RE | Admit: 2014-01-11 | Payer: Medicare Other | Source: Ambulatory Visit

## 2014-01-15 ENCOUNTER — Encounter (HOSPITAL_COMMUNITY)
Admission: RE | Admit: 2014-01-15 | Discharge: 2014-01-15 | Disposition: A | Discharge status: Home / Self Care | Payer: Medicare Other | Source: Ambulatory Visit | Attending: Nephrology | Admitting: Nephrology

## 2014-01-15 DIAGNOSIS — N183 Chronic kidney disease, stage 3 unspecified: Secondary | ICD-10-CM | POA: Insufficient documentation

## 2014-01-15 DIAGNOSIS — I129 Hypertensive chronic kidney disease with stage 1 through stage 4 chronic kidney disease, or unspecified chronic kidney disease: Secondary | ICD-10-CM | POA: Insufficient documentation

## 2014-01-15 DIAGNOSIS — D631 Anemia in chronic kidney disease: Secondary | ICD-10-CM | POA: Insufficient documentation

## 2014-01-15 DIAGNOSIS — N039 Chronic nephritic syndrome with unspecified morphologic changes: Principal | ICD-10-CM

## 2014-01-15 LAB — IRON AND TIBC
IRON: 75 ug/dL (ref 42–135)
SATURATION RATIOS: 36 % (ref 20–55)
TIBC: 211 ug/dL — AB (ref 250–470)
UIBC: 136 ug/dL (ref 125–400)

## 2014-01-15 LAB — POCT HEMOGLOBIN-HEMACUE: Hemoglobin: 10.3 g/dL — ABNORMAL LOW (ref 12.0–15.0)

## 2014-01-15 LAB — FERRITIN: Ferritin: 837 ng/mL — ABNORMAL HIGH (ref 10–291)

## 2014-01-15 MED ORDER — EPOETIN ALFA 10000 UNIT/ML IJ SOLN
INTRAMUSCULAR | Status: AC
  Administered 2014-01-15: 10000 [IU] via SUBCUTANEOUS
  Filled 2014-01-15: qty 1

## 2014-01-15 MED ORDER — EPOETIN ALFA 10000 UNIT/ML IJ SOLN: 10000.0000 [IU] | INTRAMUSCULAR | Status: DC

## 2014-02-07 ENCOUNTER — Ambulatory Visit (INDEPENDENT_AMBULATORY_CARE_PROVIDER_SITE_OTHER): Payer: Medicare Other | Admitting: Endocrinology

## 2014-02-07 ENCOUNTER — Encounter: Payer: Self-pay | Admitting: Endocrinology

## 2014-02-07 VITALS — BP 132/66 | HR 64 | Temp 98.2°F | Wt 252.0 lb

## 2014-02-07 DIAGNOSIS — E1122 Type 2 diabetes mellitus with diabetic chronic kidney disease: Secondary | ICD-10-CM

## 2014-02-07 DIAGNOSIS — M810 Age-related osteoporosis without current pathological fracture: Secondary | ICD-10-CM

## 2014-02-07 DIAGNOSIS — I10 Essential (primary) hypertension: Secondary | ICD-10-CM

## 2014-02-07 DIAGNOSIS — Z0000 Encounter for general adult medical examination without abnormal findings: Secondary | ICD-10-CM

## 2014-02-07 DIAGNOSIS — E039 Hypothyroidism, unspecified: Secondary | ICD-10-CM | POA: Diagnosis not present

## 2014-02-07 DIAGNOSIS — E785 Hyperlipidemia, unspecified: Secondary | ICD-10-CM | POA: Diagnosis not present

## 2014-02-07 DIAGNOSIS — N189 Chronic kidney disease, unspecified: Secondary | ICD-10-CM

## 2014-02-07 DIAGNOSIS — E119 Type 2 diabetes mellitus without complications: Secondary | ICD-10-CM

## 2014-02-07 LAB — TSH: TSH: 11.17 u[IU]/mL — ABNORMAL HIGH (ref 0.35–4.50)

## 2014-02-07 LAB — MICROALBUMIN / CREATININE URINE RATIO
CREATININE, U: 77 mg/dL
MICROALB/CREAT RATIO: 0.3 mg/g (ref 0.0–30.0)
Microalb, Ur: 0.2 mg/dL (ref 0.0–1.9)

## 2014-02-07 LAB — HEMOGLOBIN A1C: Hgb A1c MFr Bld: 5.7 % (ref 4.6–6.5)

## 2014-02-07 NOTE — Patient Instructions (Signed)
please consider these measures for your health:  minimize alcohol.  do not use tobacco products.  have a colonoscopy at least every 10 years from age 78.  Women should have an annual mammogram from age 48.  keep firearms safely stored.  always use seat belts.  have working smoke alarms in your home.  see an eye doctor and dentist regularly.  never drive under the influence of alcohol or drugs (including prescription drugs).   blood tests are being requested for you today.  We'll contact you with results. Please come back for a follow-up appointment in 3 months. We can do EKG and vaccinations then.

## 2014-02-07 NOTE — Progress Notes (Signed)
Subjective:    Patient ID: Stacy Walls, female    DOB: October 05, 1932, 80 y.o.   MRN: [id number]  HPI Pt returns for f/u of diabetes mellitus: DM type: 2  Dx'ed: [id number] Complications: renal failure and CAD Therapy: 2 orals GDM: never DKA: never Severe hypoglycemia: never Pancreatitis: never Other: she never took insulin.  She is too ill to undergo weight-loss surgery. Interval history: no cbg record, but states cbg's are well-controlled.  She denies hypoglycemia Denies chest pain and sob Past Medical History  Diagnosis Date  . Chest pain   . Clostridium difficile colitis   . Coronary atherosclerosis of native coronary vessel   . Chronic renal insufficiency   . HLD (hyperlipidemia)   . DM2 (diabetes mellitus, type 2)   . HTN (hypertension)   . Acute cholecystitis   . Dyshidrosis   . Routine general medical examination at a health care facility   . Secondary hyperparathyroidism (of renal origin)   . Hypothyroidism   . Dizziness   . Cough   . Neck pain   . Neck mass   . GERD (gastroesophageal reflux disease)   . Urinary tract infection   . Urinary incontinence   . Gout   . Osteoporosis   . Allergic rhinitis   . Anemia   . Diabetes mellitus     Past Surgical History  Procedure Laterality Date  . C-section (other)  1977  . L ankle surgery  1988  . Electrocardiogram  02/01/06  . Bone density  08/21/05  . Cholecystectomy    . Total left knee  2007    History   Social History  . Marital Status: Married    Spouse Name: N/A    Number of Children: N/A  . Years of Education: N/A   Occupational History  . Not on file.   Social History Main Topics  . Smoking status: Never Smoker   . Smokeless tobacco: Not on file  . Alcohol Use: No  . Drug Use: No  . Sexual Activity: Not on file   Other Topics Concern  . Not on file   Social History Narrative   Lives with husband and has lived in [place] for over 30 years.     Current Outpatient Prescriptions on  File Prior to Visit  Medication Sig Dispense Refill  . allopurinol (ZYLOPRIM) 300 MG tablet TAKE 1 TABLET DAILY  90 tablet  3  . amoxicillin (AMOXIL) 250 MG capsule Take 1 capsule (250 mg total) by mouth 3 (three) times daily.  21 capsule  0  . aspirin (ECOTRIN LOW STRENGTH) 81 MG EC tablet Take 81 mg by mouth daily.        Marland Kitchen atorvastatin (LIPITOR) 80 MG tablet Take 1 tablet (80 mg total) by mouth daily.  90 tablet  2  . benzonatate (TESSALON) 100 MG capsule Take 1 capsule (100 mg total) by mouth 3 (three) times daily as needed for cough.  30 capsule  0  . bromocriptine (PARLODEL) 2.5 MG tablet Take 1 tablet (2.5 mg total) by mouth at bedtime.  30 tablet  11  . epoetin alfa (PROCRIT) 25956 UNIT/ML injection Inject 10,000 Units into the skin every 30 (thirty) days. Dr. Justin Mend      . Ferrous Sulfate (IRON) 325 (65 FE) MG TABS Take 1 tablet by mouth daily.       . fluticasone (FLONASE) 50 MCG/ACT nasal spray USE 2 SPRAYS IN BOTH NOSTRILS EVERY DAY  32 g  6  .  furosemide (LASIX) 20 MG tablet TAKE 1 TABLET BY MOUTH DAILY  90 tablet  3  . JANUVIA 100 MG tablet TAKE 1/2 TABLET BY MOUTH EVERY DAY.  15 tablet  0  . losartan-hydrochlorothiazide (HYZAAR) 100-25 MG per tablet TAKE 1 TABLET BY MOUTH DAILY  90 tablet  0  . omeprazole (PRILOSEC) 20 MG capsule TAKE 1 CAPSULE DAILY  90 capsule  11  . oxybutynin (DITROPAN) 5 MG tablet TAKE 1 TABLET DAILY  90 tablet  2  . oxybutynin (DITROPAN) 5 MG tablet TAKE 1 TABLET DAILY  90 tablet  3  . triamcinolone ointment (KENALOG) 0.1 % Apply topically 3 (three) times daily. Prn itching  454 g  1  . [DISCONTINUED] oxybutynin (DITROPAN) 5 MG tablet TAKE 1 TABLET DAILY  90 tablet  3   No current facility-administered medications on file prior to visit.    Allergies  Allergen Reactions  . Azithromycin   . Pioglitazone     REACTION: edema  . Sulfonamide Derivatives     REACTION: rash  . Tramadol     dizziness    Family History  Problem Relation Age of Onset  .  Coronary artery disease Father   . Stroke Father   . Diabetes Father   . Diabetes Sister   . Stroke Sister   . Diabetes Brother     BP 132/66  Pulse 64  Temp(Src) 98.2 F (36.8 C) (Oral)  Wt 252 lb (114.306 kg)  SpO2 96%  Review of Systems Denies weight change and depression    Objective:   Physical Exam VITAL SIGNS:  See vs page GENERAL: no distress Pulses: dorsalis pedis intact bilat.   Feet: no deformity. Trace bilat leg edema.  Skin:  no ulcer on the feet.  normal color and temp. Neuro: sensation is intact to touch on the feet.    ecg is refused  Lab Results  Component Value Date   HGBA1C 5.7 02/07/2014   Lab Results  Component Value Date   CHOL 156 02/07/2014   HDL 47.70 02/07/2014   LDLCALC 80 02/07/2014   LDLDIRECT 108.6 11/10/2010   TRIG 141.0 02/07/2014   CHOLHDL 3 02/07/2014   Lab Results  Component Value Date   TSH 11.17* 02/07/2014       Assessment & Plan:  Hypothyroidism: new DM: well-controlled Dyslipidemia: well-controlled    Subjective:   Patient here for Medicare annual wellness visit and management of other chronic and acute problems.     Risk factors: advanced age    44 of Physicians Providing Medical Care to Patient:  See "snapshot"   Activities of Daily Living: In your present state of health, do you have any difficulty performing the following activities?:  Preparing food and eating?: No  Bathing yourself: No  Getting dressed: No  Using the toilet:No  Moving around from place to place: No  In the past year have you fallen or had a near fall?:No    Home Safety: Has smoke detector and wears seat belts. No firearms.  Diet and Exercise  Current exercise habits: pt says good Dietary issues discussed: pt reports a healthy diet   Depression Screen  Q1: Over the past two weeks, have you felt down, depressed or hopeless?no  Q2: Over the past two weeks, have you felt little interest or pleasure in doing things? no   The  following portions of the patient's history were reviewed and updated as appropriate: allergies, current medications, past family history, past medical history, past  social history, past surgical history and problem list.  Past Medical History  Diagnosis Date  . Chest pain   . Clostridium difficile colitis   . Coronary atherosclerosis of native coronary vessel   . Chronic renal insufficiency   . HLD (hyperlipidemia)   . DM2 (diabetes mellitus, type 2)   . HTN (hypertension)   . Acute cholecystitis   . Dyshidrosis   . Routine general medical examination at a health care facility   . Secondary hyperparathyroidism (of renal origin)   . Hypothyroidism   . Dizziness   . Cough   . Neck pain   . Neck mass   . GERD (gastroesophageal reflux disease)   . Urinary tract infection   . Urinary incontinence   . Gout   . Osteoporosis   . Allergic rhinitis   . Anemia   . Diabetes mellitus     Past Surgical History  Procedure Laterality Date  . C-section (other)  1977  . L ankle surgery  1988  . Electrocardiogram  02/01/06  . Bone density  08/21/05  . Cholecystectomy    . Total left knee  2007    History   Social History  . Marital Status: Married    Spouse Name: N/A    Number of Children: N/A  . Years of Education: N/A   Occupational History  . Not on file.   Social History Main Topics  . Smoking status: Never Smoker   . Smokeless tobacco: Not on file  . Alcohol Use: No  . Drug Use: No  . Sexual Activity: Not on file   Other Topics Concern  . Not on file   Social History Narrative   Lives with husband and has lived in [place] for over 30 years.     Current Outpatient Prescriptions on File Prior to Visit  Medication Sig Dispense Refill  . allopurinol (ZYLOPRIM) 300 MG tablet TAKE 1 TABLET DAILY  90 tablet  3  . amoxicillin (AMOXIL) 250 MG capsule Take 1 capsule (250 mg total) by mouth 3 (three) times daily.  21 capsule  0  . aspirin (ECOTRIN LOW STRENGTH) 81 MG EC  tablet Take 81 mg by mouth daily.        Marland Kitchen atorvastatin (LIPITOR) 80 MG tablet Take 1 tablet (80 mg total) by mouth daily.  90 tablet  2  . benzonatate (TESSALON) 100 MG capsule Take 1 capsule (100 mg total) by mouth 3 (three) times daily as needed for cough.  30 capsule  0  . bromocriptine (PARLODEL) 2.5 MG tablet Take 1 tablet (2.5 mg total) by mouth at bedtime.  30 tablet  11  . epoetin alfa (PROCRIT) 57846 UNIT/ML injection Inject 10,000 Units into the skin every 30 (thirty) days. Dr. Justin Mend      . Ferrous Sulfate (IRON) 325 (65 FE) MG TABS Take 1 tablet by mouth daily.       . fluticasone (FLONASE) 50 MCG/ACT nasal spray USE 2 SPRAYS IN BOTH NOSTRILS EVERY DAY  32 g  6  . furosemide (LASIX) 20 MG tablet TAKE 1 TABLET BY MOUTH DAILY  90 tablet  3  . JANUVIA 100 MG tablet TAKE 1/2 TABLET BY MOUTH EVERY DAY.  15 tablet  0  . losartan-hydrochlorothiazide (HYZAAR) 100-25 MG per tablet TAKE 1 TABLET BY MOUTH DAILY  90 tablet  0  . omeprazole (PRILOSEC) 20 MG capsule TAKE 1 CAPSULE DAILY  90 capsule  11  . oxybutynin (DITROPAN) 5 MG tablet TAKE 1  TABLET DAILY  90 tablet  2  . oxybutynin (DITROPAN) 5 MG tablet TAKE 1 TABLET DAILY  90 tablet  3  . triamcinolone ointment (KENALOG) 0.1 % Apply topically 3 (three) times daily. Prn itching  454 g  1  . [DISCONTINUED] oxybutynin (DITROPAN) 5 MG tablet TAKE 1 TABLET DAILY  90 tablet  3   No current facility-administered medications on file prior to visit.    Allergies  Allergen Reactions  . Azithromycin   . Pioglitazone     REACTION: edema  . Sulfonamide Derivatives     REACTION: rash  . Tramadol     dizziness    Family History  Problem Relation Age of Onset  . Coronary artery disease Father   . Stroke Father   . Diabetes Father   . Diabetes Sister   . Stroke Sister   . Diabetes Brother     BP 132/66  Pulse 64  Temp(Src) 98.2 F (36.8 C) (Oral)  Wt 252 lb (114.306 kg)  SpO2 96%   Review of Systems  Denies hearing loss, and visual  loss Objective:   Vision:  Sees opthalmologist Hearing: grossly normal Body mass index:  See vs page Msk: pt slowly performs "get-up-and-go" from a sitting position Cognitive Impairment Assessment: cognition, memory and judgment appear normal.  remembers 3/3 at 5 minutes.  excellent recall.  can easily read and write a sentence.  alert and oriented x 3   Assessment:   Medicare wellness utd on preventive parameters    Plan:   During the course of the visit the patient was educated and counseled about appropriate screening and preventive services including:        Fall prevention   Screening mammography  Bone densitometry screening  Diabetes screening  Nutrition counseling   Vaccines / LABS Vaccines  Are declined today   Patient Instructions (the written plan) was given to the patient.  we discussed code status.  pt requests full code, but would not want to be started or maintained on artificial life-support measures if there was not a reasonable chance of recovery

## 2014-02-08 LAB — LIPID PANEL
CHOL/HDL RATIO: 3
Cholesterol: 156 mg/dL (ref 0–200)
HDL: 47.7 mg/dL (ref >39.00)
LDL Cholesterol: 80 mg/dL (ref 0–99)
NonHDL: 108.3
Triglycerides: 141 mg/dL (ref 0.0–149.0)
VLDL: 28.2 mg/dL (ref 0.0–40.0)

## 2014-02-08 LAB — HEPATIC FUNCTION PANEL
ALBUMIN: 3.2 g/dL — AB (ref 3.5–5.2)
ALK PHOS: 113 U/L (ref 39–117)
ALT: 12 U/L (ref 0–35)
AST: 17 U/L (ref 0–37)
Bilirubin, Direct: 0.1 mg/dL (ref 0.0–0.3)
Total Bilirubin: 0.8 mg/dL (ref 0.2–1.2)
Total Protein: 6.5 g/dL (ref 6.0–8.3)

## 2014-02-08 MED ORDER — LEVOTHYROXINE SODIUM 50 MCG PO TABS: 50.0000 ug | ORAL_TABLET | Freq: Every day | ORAL | Status: DC

## 2014-02-14 ENCOUNTER — Other Ambulatory Visit: Payer: Self-pay | Admitting: Nurse Practitioner

## 2014-02-14 ENCOUNTER — Encounter (HOSPITAL_COMMUNITY)
Admission: RE | Admit: 2014-02-14 | Discharge: 2014-02-14 | Disposition: A | Discharge status: Home / Self Care | Payer: Medicare Other | Source: Ambulatory Visit | Attending: Nephrology | Admitting: Nephrology

## 2014-02-14 DIAGNOSIS — N183 Chronic kidney disease, stage 3 (moderate): Secondary | ICD-10-CM | POA: Diagnosis not present

## 2014-02-14 DIAGNOSIS — D631 Anemia in chronic kidney disease: Secondary | ICD-10-CM | POA: Diagnosis not present

## 2014-02-14 LAB — POCT HEMOGLOBIN-HEMACUE: Hemoglobin: 10.6 g/dL — ABNORMAL LOW (ref 12.0–15.0)

## 2014-02-14 LAB — IRON AND TIBC
Iron: 52 ug/dL (ref 42–135)
SATURATION RATIOS: 26 % (ref 20–55)
TIBC: 199 ug/dL — ABNORMAL LOW (ref 250–470)
UIBC: 147 ug/dL (ref 125–400)

## 2014-02-14 LAB — FERRITIN: Ferritin: 820 ng/mL — ABNORMAL HIGH (ref 10–291)

## 2014-02-14 MED ORDER — EPOETIN ALFA 10000 UNIT/ML IJ SOLN
INTRAMUSCULAR | Status: AC
  Administered 2014-02-14: 10000 [IU] via SUBCUTANEOUS
  Filled 2014-02-14: qty 1

## 2014-02-14 MED ORDER — EPOETIN ALFA 10000 UNIT/ML IJ SOLN
10000.0000 [IU] | INTRAMUSCULAR | Status: DC
  Administered 2014-02-14: 10000 [IU] via SUBCUTANEOUS

## 2014-02-20 ENCOUNTER — Other Ambulatory Visit: Payer: Medicare Other

## 2014-02-27 ENCOUNTER — Other Ambulatory Visit: Payer: Self-pay | Admitting: Endocrinology

## 2014-03-01 ENCOUNTER — Ambulatory Visit
Admission: RE | Admit: 2014-03-01 | Discharge: 2014-03-01 | Disposition: A | Discharge status: Home / Self Care | Payer: Medicare Other | Source: Ambulatory Visit | Attending: Endocrinology | Admitting: Endocrinology

## 2014-03-01 DIAGNOSIS — M810 Age-related osteoporosis without current pathological fracture: Secondary | ICD-10-CM

## 2014-03-07 ENCOUNTER — Ambulatory Visit (INDEPENDENT_AMBULATORY_CARE_PROVIDER_SITE_OTHER): Payer: Medicare Other

## 2014-03-07 DIAGNOSIS — Z23 Encounter for immunization: Secondary | ICD-10-CM | POA: Diagnosis not present

## 2014-03-12 ENCOUNTER — Other Ambulatory Visit: Payer: Self-pay | Admitting: Endocrinology

## 2014-03-14 ENCOUNTER — Encounter (HOSPITAL_COMMUNITY)
Admission: RE | Admit: 2014-03-14 | Discharge: 2014-03-14 | Disposition: A | Discharge status: Home / Self Care | Payer: Medicare Other | Source: Ambulatory Visit | Attending: Nephrology | Admitting: Nephrology

## 2014-03-14 DIAGNOSIS — D631 Anemia in chronic kidney disease: Secondary | ICD-10-CM | POA: Insufficient documentation

## 2014-03-14 DIAGNOSIS — N183 Chronic kidney disease, stage 3 (moderate): Secondary | ICD-10-CM | POA: Diagnosis not present

## 2014-03-14 LAB — IRON AND TIBC
Iron: 65 ug/dL (ref 42–135)
Saturation Ratios: 31 % (ref 20–55)
TIBC: 208 ug/dL — ABNORMAL LOW (ref 250–470)
UIBC: 143 ug/dL (ref 125–400)

## 2014-03-14 LAB — FERRITIN: Ferritin: 842 ng/mL — ABNORMAL HIGH (ref 10–291)

## 2014-03-14 MED ORDER — EPOETIN ALFA 10000 UNIT/ML IJ SOLN
10000.0000 [IU] | INTRAMUSCULAR | Status: DC
  Administered 2014-03-14: 10000 [IU] via SUBCUTANEOUS

## 2014-03-14 MED ORDER — EPOETIN ALFA 10000 UNIT/ML IJ SOLN
INTRAMUSCULAR | Status: AC
  Filled 2014-03-14: qty 1

## 2014-03-17 LAB — POCT HEMOGLOBIN-HEMACUE: HEMOGLOBIN: 11.3 g/dL — AB (ref 12.0–15.0)

## 2014-03-26 ENCOUNTER — Other Ambulatory Visit: Payer: Self-pay | Admitting: Endocrinology

## 2014-04-05 ENCOUNTER — Inpatient Hospital Stay: Admission: RE | Admit: 2014-04-05 | Payer: Medicare Other | Source: Ambulatory Visit

## 2014-04-10 ENCOUNTER — Other Ambulatory Visit (HOSPITAL_COMMUNITY): Payer: Self-pay | Admitting: *Deleted

## 2014-04-11 ENCOUNTER — Encounter (HOSPITAL_COMMUNITY)
Admission: RE | Admit: 2014-04-11 | Discharge: 2014-04-11 | Disposition: A | Discharge status: Home / Self Care | Payer: Medicare Other | Source: Ambulatory Visit | Attending: Nephrology | Admitting: Nephrology

## 2014-04-11 DIAGNOSIS — N183 Chronic kidney disease, stage 3 (moderate): Secondary | ICD-10-CM | POA: Insufficient documentation

## 2014-04-11 DIAGNOSIS — D631 Anemia in chronic kidney disease: Secondary | ICD-10-CM | POA: Diagnosis not present

## 2014-04-11 LAB — IRON AND TIBC
IRON: 53 ug/dL (ref 42–135)
SATURATION RATIOS: 26 % (ref 20–55)
TIBC: 203 ug/dL — ABNORMAL LOW (ref 250–470)
UIBC: 150 ug/dL (ref 125–400)

## 2014-04-11 LAB — POCT HEMOGLOBIN-HEMACUE: Hemoglobin: 11.3 g/dL — ABNORMAL LOW (ref 12.0–15.0)

## 2014-04-11 MED ORDER — EPOETIN ALFA 10000 UNIT/ML IJ SOLN
INTRAMUSCULAR | Status: AC
  Filled 2014-04-11: qty 1

## 2014-04-11 MED ORDER — EPOETIN ALFA 10000 UNIT/ML IJ SOLN
10000.0000 [IU] | INTRAMUSCULAR | Status: DC
  Administered 2014-04-11: 10000 [IU] via SUBCUTANEOUS

## 2014-04-12 LAB — FERRITIN: Ferritin: 982 ng/mL — ABNORMAL HIGH (ref 10–291)

## 2014-04-19 ENCOUNTER — Ambulatory Visit (INDEPENDENT_AMBULATORY_CARE_PROVIDER_SITE_OTHER)
Admission: RE | Admit: 2014-04-19 | Discharge: 2014-04-19 | Disposition: A | Discharge status: Home / Self Care | Payer: Medicare Other | Source: Ambulatory Visit | Attending: Endocrinology | Admitting: Endocrinology

## 2014-04-19 DIAGNOSIS — M810 Age-related osteoporosis without current pathological fracture: Secondary | ICD-10-CM

## 2014-04-29 ENCOUNTER — Other Ambulatory Visit: Payer: Self-pay | Admitting: Endocrinology

## 2014-04-30 NOTE — Telephone Encounter (Signed)
Please advise if ok to refill rx. Medication is not on current med list.  Thanks!

## 2014-05-09 ENCOUNTER — Encounter: Payer: Self-pay | Admitting: Endocrinology

## 2014-05-09 ENCOUNTER — Ambulatory Visit (INDEPENDENT_AMBULATORY_CARE_PROVIDER_SITE_OTHER): Payer: Medicare Other | Admitting: Endocrinology

## 2014-05-09 VITALS — BP 124/80 | HR 72 | Temp 98.2°F | Wt 249.0 lb

## 2014-05-09 DIAGNOSIS — E039 Hypothyroidism, unspecified: Secondary | ICD-10-CM

## 2014-05-09 DIAGNOSIS — N189 Chronic kidney disease, unspecified: Secondary | ICD-10-CM

## 2014-05-09 DIAGNOSIS — E1122 Type 2 diabetes mellitus with diabetic chronic kidney disease: Secondary | ICD-10-CM | POA: Diagnosis not present

## 2014-05-09 NOTE — Progress Notes (Signed)
Subjective:    Patient ID: Stacy Walls, female    DOB: April 20, 1933, 81 y.o.   MRN: [id number]  HPI Pt returns for f/u of diabetes mellitus: DM type: 2  Dx'ed: Q000111Q Complications: renal failure and CAD Therapy: 2 orals GDM: never DKA: never Severe hypoglycemia: never Pancreatitis: never Other: she has never taken insulin.  She is too ill to undergo weight-loss surgery. Interval history: no cbg record, but states cbg's are well-controlled.  She denies hypoglycemia. since on the synthroid, she feels no different.   Past Medical History  Diagnosis Date  . Chest pain   . Clostridium difficile colitis   . Coronary atherosclerosis of native coronary vessel   . Chronic renal insufficiency   . HLD (hyperlipidemia)   . DM2 (diabetes mellitus, type 2)   . HTN (hypertension)   . Acute cholecystitis   . Dyshidrosis   . Routine general medical examination at a health care facility   . Secondary hyperparathyroidism (of renal origin)   . Hypothyroidism   . Dizziness   . Cough   . Neck pain   . Neck mass   . GERD (gastroesophageal reflux disease)   . Urinary tract infection   . Urinary incontinence   . Gout   . Osteoporosis   . Allergic rhinitis   . Anemia   . Diabetes mellitus     Past Surgical History  Procedure Laterality Date  . C-section (other)  1977  . L ankle surgery  1988  . Electrocardiogram  02/01/06  . Bone density  08/21/05  . Cholecystectomy    . Total left knee  2007    History   Social History  . Marital Status: Married    Spouse Name: N/A    Number of Children: N/A  . Years of Education: N/A   Occupational History  . Not on file.   Social History Main Topics  . Smoking status: Never Smoker   . Smokeless tobacco: Not on file  . Alcohol Use: No  . Drug Use: No  . Sexual Activity: Not on file   Other Topics Concern  . Not on file   Social History Narrative   Lives with husband and has lived in [place] for over 30 years.     Current  Outpatient Prescriptions on File Prior to Visit  Medication Sig Dispense Refill  . allopurinol (ZYLOPRIM) 300 MG tablet TAKE 1 TABLET DAILY 90 tablet 3  . amoxicillin (AMOXIL) 250 MG capsule Take 1 capsule (250 mg total) by mouth 3 (three) times daily. 21 capsule 0  . aspirin (ECOTRIN LOW STRENGTH) 81 MG EC tablet Take 81 mg by mouth daily.      Marland Kitchen atorvastatin (LIPITOR) 80 MG tablet Take 1 tablet (80 mg total) by mouth daily. 90 tablet 2  . benzonatate (TESSALON) 100 MG capsule Take 1 capsule (100 mg total) by mouth 3 (three) times daily as needed for cough. 30 capsule 0  . bromocriptine (PARLODEL) 2.5 MG tablet Take 1 tablet (2.5 mg total) by mouth at bedtime. 30 tablet 11  . epoetin alfa (PROCRIT) 16109 UNIT/ML injection Inject 10,000 Units into the skin every 30 (thirty) days. Dr. Justin Mend    . Ferrous Sulfate (IRON) 325 (65 FE) MG TABS Take 1 tablet by mouth daily.     . fluticasone (FLONASE) 50 MCG/ACT nasal spray USE 2 SPRAYS IN BOTH NOSTRILS EVERY DAY 32 g 6  . furosemide (LASIX) 20 MG tablet TAKE 1 TABLET BY MOUTH EVERY DAY  90 tablet 0  . JANUVIA 100 MG tablet TAKE 1/2 TABLET BY MOUTH EVERY DAY. 15 tablet 0  . JANUVIA 100 MG tablet TAKE 1/2 TABLET BY MOUTH EVERY DAY 15 tablet 0  . levothyroxine (SYNTHROID, LEVOTHROID) 50 MCG tablet Take 1 tablet (50 mcg total) by mouth daily before breakfast. 30 tablet 11  . losartan-hydrochlorothiazide (HYZAAR) 100-25 MG per tablet TAKE 1 TABLET BY MOUTH DAILY 90 tablet 0  . omeprazole (PRILOSEC) 20 MG capsule TAKE 1 CAPSULE DAILY 90 capsule 11  . oxybutynin (DITROPAN) 5 MG tablet TAKE 1 TABLET DAILY 90 tablet 2  . oxybutynin (DITROPAN) 5 MG tablet TAKE 1 TABLET DAILY 90 tablet 3  . triamcinolone ointment (KENALOG) 0.1 % APPLY TOPICALLY THREE TIMES DAILY AS NEEDED FOR ITCHING 454 g 0  . [DISCONTINUED] oxybutynin (DITROPAN) 5 MG tablet TAKE 1 TABLET DAILY 90 tablet 3   No current facility-administered medications on file prior to visit.    Allergies    Allergen Reactions  . Azithromycin   . Pioglitazone     REACTION: edema  . Sulfonamide Derivatives     REACTION: rash  . Tramadol     dizziness    Family History  Problem Relation Age of Onset  . Coronary artery disease Father   . Stroke Father   . Diabetes Father   . Diabetes Sister   . Stroke Sister   . Diabetes Brother     BP 124/80 mmHg  Pulse 72  Temp(Src) 98.2 F (36.8 C) (Oral)  Wt 249 lb (112.946 kg)  SpO2 95%   Review of Systems Denies weight change and edema    Objective:   Physical Exam VITAL SIGNS:  See vs page GENERAL: no distress Pulses: dorsalis pedis intact bilat.   MSK: no deformity of the feet CV: no leg edema Skin:  no ulcer on the feet.  normal color and temp on the feet. Neuro: sensation is intact to touch on the feet.       Assessment & Plan:  DM: apparently well-controlled Hypothyroidism, on rx.  Patient is advised the following: Patient Instructions  blood tests are being requested for you today.  We'll let you know about the results. Please come back for a follow-up appointment in 6 months.

## 2014-05-09 NOTE — Patient Instructions (Addendum)
blood tests are being requested for you today.  We'll let you know about the results. Please come back for a follow-up appointment in 6 months.

## 2014-05-10 ENCOUNTER — Encounter (HOSPITAL_COMMUNITY)
Admission: RE | Admit: 2014-05-10 | Discharge: 2014-05-10 | Disposition: A | Discharge status: Home / Self Care | Payer: Medicare Other | Source: Ambulatory Visit | Attending: Nephrology | Admitting: Nephrology

## 2014-05-10 DIAGNOSIS — D631 Anemia in chronic kidney disease: Secondary | ICD-10-CM | POA: Diagnosis not present

## 2014-05-10 DIAGNOSIS — N183 Chronic kidney disease, stage 3 (moderate): Secondary | ICD-10-CM | POA: Diagnosis not present

## 2014-05-10 LAB — IRON AND TIBC
Iron: 58 ug/dL (ref 42–145)
SATURATION RATIOS: 34 % (ref 20–55)
TIBC: 172 ug/dL — AB (ref 250–470)
UIBC: 114 ug/dL — ABNORMAL LOW (ref 125–400)

## 2014-05-10 MED ORDER — EPOETIN ALFA 10000 UNIT/ML IJ SOLN
10000.0000 [IU] | INTRAMUSCULAR | Status: DC
  Administered 2014-05-10: 10000 [IU] via SUBCUTANEOUS

## 2014-05-10 MED ORDER — EPOETIN ALFA 10000 UNIT/ML IJ SOLN
INTRAMUSCULAR | Status: AC
  Filled 2014-05-10: qty 1

## 2014-05-11 LAB — POCT HEMOGLOBIN-HEMACUE: Hemoglobin: 10.8 g/dL — ABNORMAL LOW (ref 12.0–15.0)

## 2014-05-11 LAB — FERRITIN: Ferritin: 990 ng/mL — ABNORMAL HIGH (ref 10–291)

## 2014-05-16 ENCOUNTER — Ambulatory Visit (INDEPENDENT_AMBULATORY_CARE_PROVIDER_SITE_OTHER): Payer: Medicare Other

## 2014-05-16 DIAGNOSIS — E1122 Type 2 diabetes mellitus with diabetic chronic kidney disease: Secondary | ICD-10-CM | POA: Diagnosis not present

## 2014-05-16 DIAGNOSIS — E039 Hypothyroidism, unspecified: Secondary | ICD-10-CM

## 2014-05-16 DIAGNOSIS — N189 Chronic kidney disease, unspecified: Secondary | ICD-10-CM

## 2014-05-16 LAB — TSH: TSH: 0.69 u[IU]/mL (ref 0.35–4.50)

## 2014-05-16 LAB — HEMOGLOBIN A1C: Hgb A1c MFr Bld: 6.2 % (ref 4.6–6.5)

## 2014-05-24 ENCOUNTER — Other Ambulatory Visit: Payer: Self-pay | Admitting: Endocrinology

## 2014-06-07 ENCOUNTER — Encounter (HOSPITAL_COMMUNITY)
Admission: RE | Admit: 2014-06-07 | Discharge: 2014-06-07 | Disposition: A | Discharge status: Home / Self Care | Payer: Medicare Other | Source: Ambulatory Visit | Attending: Nephrology | Admitting: Nephrology

## 2014-06-07 DIAGNOSIS — N183 Chronic kidney disease, stage 3 (moderate): Secondary | ICD-10-CM | POA: Insufficient documentation

## 2014-06-07 DIAGNOSIS — Z79899 Other long term (current) drug therapy: Secondary | ICD-10-CM | POA: Diagnosis not present

## 2014-06-07 DIAGNOSIS — D631 Anemia in chronic kidney disease: Secondary | ICD-10-CM | POA: Diagnosis not present

## 2014-06-07 DIAGNOSIS — Z5181 Encounter for therapeutic drug level monitoring: Secondary | ICD-10-CM | POA: Insufficient documentation

## 2014-06-07 LAB — POCT HEMOGLOBIN-HEMACUE: Hemoglobin: 11.2 g/dL — ABNORMAL LOW (ref 12.0–15.0)

## 2014-06-07 LAB — IRON AND TIBC
IRON: 60 ug/dL (ref 42–145)
Saturation Ratios: 32 % (ref 20–55)
TIBC: 190 ug/dL — ABNORMAL LOW (ref 250–470)
UIBC: 130 ug/dL (ref 125–400)

## 2014-06-07 LAB — FERRITIN: Ferritin: 979 ng/mL — ABNORMAL HIGH (ref 10–291)

## 2014-06-07 MED ORDER — EPOETIN ALFA 10000 UNIT/ML IJ SOLN
10000.0000 [IU] | INTRAMUSCULAR | Status: DC
  Administered 2014-06-07: 10000 [IU] via SUBCUTANEOUS

## 2014-06-08 MED ORDER — EPOETIN ALFA 10000 UNIT/ML IJ SOLN
INTRAMUSCULAR | Status: AC
  Filled 2014-06-08: qty 1

## 2014-06-21 ENCOUNTER — Other Ambulatory Visit: Payer: Self-pay | Admitting: Endocrinology

## 2014-07-05 ENCOUNTER — Encounter (HOSPITAL_COMMUNITY): Payer: Medicare Other

## 2014-07-09 ENCOUNTER — Encounter (HOSPITAL_COMMUNITY)
Admission: RE | Admit: 2014-07-09 | Discharge: 2014-07-09 | Disposition: A | Discharge status: Home / Self Care | Payer: Medicare Other | Source: Ambulatory Visit | Attending: Nephrology | Admitting: Nephrology

## 2014-07-09 DIAGNOSIS — N183 Chronic kidney disease, stage 3 (moderate): Secondary | ICD-10-CM | POA: Diagnosis not present

## 2014-07-09 DIAGNOSIS — D631 Anemia in chronic kidney disease: Secondary | ICD-10-CM | POA: Diagnosis not present

## 2014-07-09 LAB — POCT HEMOGLOBIN-HEMACUE: Hemoglobin: 11 g/dL — ABNORMAL LOW (ref 12.0–15.0)

## 2014-07-09 MED ORDER — EPOETIN ALFA 10000 UNIT/ML IJ SOLN
10000.0000 [IU] | INTRAMUSCULAR | Status: DC
  Administered 2014-07-09: 10000 [IU] via SUBCUTANEOUS

## 2014-07-10 LAB — IRON AND TIBC
Iron: 65 ug/dL (ref 42–145)
SATURATION RATIOS: 34 % (ref 20–55)
TIBC: 194 ug/dL — AB (ref 250–470)
UIBC: 129 ug/dL (ref 125–400)

## 2014-07-10 MED ORDER — EPOETIN ALFA 10000 UNIT/ML IJ SOLN
INTRAMUSCULAR | Status: AC
  Administered 2014-07-09: 10000 [IU] via SUBCUTANEOUS
  Filled 2014-07-10: qty 1

## 2014-07-11 LAB — FERRITIN: FERRITIN: 932 ng/mL — AB (ref 10–291)

## 2014-07-30 ENCOUNTER — Other Ambulatory Visit: Payer: Self-pay | Admitting: Endocrinology

## 2014-08-09 ENCOUNTER — Encounter (HOSPITAL_COMMUNITY)
Admission: RE | Admit: 2014-08-09 | Discharge: 2014-08-09 | Disposition: A | Discharge status: Home / Self Care | Payer: Medicare Other | Source: Ambulatory Visit | Attending: Nephrology | Admitting: Nephrology

## 2014-08-09 DIAGNOSIS — N183 Chronic kidney disease, stage 3 (moderate): Secondary | ICD-10-CM | POA: Insufficient documentation

## 2014-08-09 DIAGNOSIS — D631 Anemia in chronic kidney disease: Secondary | ICD-10-CM | POA: Insufficient documentation

## 2014-08-09 LAB — POCT HEMOGLOBIN-HEMACUE: Hemoglobin: 10.8 g/dL — ABNORMAL LOW (ref 12.0–15.0)

## 2014-08-09 MED ORDER — EPOETIN ALFA 10000 UNIT/ML IJ SOLN
INTRAMUSCULAR | Status: AC
  Filled 2014-08-09: qty 1

## 2014-08-09 MED ORDER — EPOETIN ALFA 10000 UNIT/ML IJ SOLN
10000.0000 [IU] | INTRAMUSCULAR | Status: DC
  Administered 2014-08-09: 10000 [IU] via SUBCUTANEOUS

## 2014-08-10 LAB — IRON AND TIBC
Iron: 71 ug/dL (ref 42–145)
SATURATION RATIOS: 34 % (ref 20–55)
TIBC: 211 ug/dL — ABNORMAL LOW (ref 250–470)
UIBC: 140 ug/dL (ref 125–400)

## 2014-08-10 LAB — FERRITIN: Ferritin: 750 ng/mL — ABNORMAL HIGH (ref 10–291)

## 2014-08-27 ENCOUNTER — Other Ambulatory Visit: Payer: Self-pay | Admitting: Endocrinology

## 2014-08-31 ENCOUNTER — Encounter: Payer: Self-pay | Admitting: Endocrinology

## 2014-08-31 ENCOUNTER — Ambulatory Visit (INDEPENDENT_AMBULATORY_CARE_PROVIDER_SITE_OTHER): Payer: Medicare Other | Admitting: Endocrinology

## 2014-08-31 VITALS — BP 122/84 | HR 76 | Temp 98.8°F | Ht 63.0 in | Wt 253.0 lb

## 2014-08-31 DIAGNOSIS — J069 Acute upper respiratory infection, unspecified: Secondary | ICD-10-CM

## 2014-08-31 MED ORDER — AMOXICILLIN 250 MG PO CAPS: 250.0000 mg | ORAL_CAPSULE | Freq: Three times a day (TID) | ORAL | Status: DC

## 2014-08-31 MED ORDER — BENZONATATE 100 MG PO CAPS: 100.0000 mg | ORAL_CAPSULE | Freq: Three times a day (TID) | ORAL | Status: DC | PRN

## 2014-08-31 NOTE — Patient Instructions (Addendum)
i have sent 2 prescriptions to your pharmacy, for an antibiotic pill and cough medicine.   Please come back for a follow-up appointment in 6 months.

## 2014-08-31 NOTE — Progress Notes (Signed)
Subjective:    Patient ID: Stacy Walls, female    DOB: 24-Nov-1932, 81 y.o.   MRN: [id number]  HPI Pt states 1 week of slight pain at the throat, and assoc nasal congestion.   Past Medical History  Diagnosis Date  . Chest pain   . Clostridium difficile colitis   . Coronary atherosclerosis of native coronary vessel   . Chronic renal insufficiency   . HLD (hyperlipidemia)   . DM2 (diabetes mellitus, type 2)   . HTN (hypertension)   . Acute cholecystitis   . Dyshidrosis   . Routine general medical examination at a health care facility   . Secondary hyperparathyroidism (of renal origin)   . Hypothyroidism   . Dizziness   . Cough   . Neck pain   . Neck mass   . GERD (gastroesophageal reflux disease)   . Urinary tract infection   . Urinary incontinence   . Gout   . Osteoporosis   . Allergic rhinitis   . Anemia   . Diabetes mellitus     Past Surgical History  Procedure Laterality Date  . C-section (other)  1977  . L ankle surgery  1988  . Electrocardiogram  02/01/06  . Bone density  08/21/05  . Cholecystectomy    . Total left knee  2007    History   Social History  . Marital Status: Married    Spouse Name: N/A  . Number of Children: N/A  . Years of Education: N/A   Occupational History  . Not on file.   Social History Main Topics  . Smoking status: Never Smoker   . Smokeless tobacco: Not on file  . Alcohol Use: No  . Drug Use: No  . Sexual Activity: Not on file   Other Topics Concern  . Not on file   Social History Narrative   Lives with husband and has lived in [place] for over 30 years.     Current Outpatient Prescriptions on File Prior to Visit  Medication Sig Dispense Refill  . allopurinol (ZYLOPRIM) 300 MG tablet TAKE 1 TABLET DAILY 90 tablet 3  . aspirin (ECOTRIN LOW STRENGTH) 81 MG EC tablet Take 81 mg by mouth daily.      Marland Kitchen atorvastatin (LIPITOR) 80 MG tablet Take 1 tablet (80 mg total) by mouth daily. 90 tablet 2  . bromocriptine  (PARLODEL) 2.5 MG tablet Take 1 tablet (2.5 mg total) by mouth at bedtime. 30 tablet 11  . epoetin alfa (PROCRIT) 29562 UNIT/ML injection Inject 10,000 Units into the skin every 30 (thirty) days. Dr. Justin Mend    . Ferrous Sulfate (IRON) 325 (65 FE) MG TABS Take 1 tablet by mouth daily.     . fluticasone (FLONASE) 50 MCG/ACT nasal spray USE 2 SPRAYS IN BOTH NOSTRILS EVERY DAY 32 g 6  . furosemide (LASIX) 20 MG tablet TAKE 1 TABLET BY MOUTH EVERY DAY 90 tablet 0  . JANUVIA 100 MG tablet TAKE 1/2 TABLET BY MOUTH EVERY DAY. 15 tablet 0  . JANUVIA 100 MG tablet TAKE 1/2 TABLET BY MOUTH EVERY DAY 45 tablet 0  . levothyroxine (SYNTHROID, LEVOTHROID) 50 MCG tablet Take 1 tablet (50 mcg total) by mouth daily before breakfast. 30 tablet 11  . losartan-hydrochlorothiazide (HYZAAR) 100-25 MG per tablet TAKE 1 TABLET BY MOUTH EVERY DAY. 90 tablet 0  . omeprazole (PRILOSEC) 20 MG capsule TAKE 1 CAPSULE DAILY 90 capsule 11  . oxybutynin (DITROPAN) 5 MG tablet TAKE 1 TABLET DAILY 90 tablet  2  . oxybutynin (DITROPAN) 5 MG tablet TAKE 1 TABLET DAILY 90 tablet 3  . triamcinolone ointment (KENALOG) 0.1 % APPLY TOPICALLY THREE TIMES DAILY AS NEEDED FOR ITCHING 454 g 0   No current facility-administered medications on file prior to visit.    Allergies  Allergen Reactions  . Azithromycin   . Pioglitazone     REACTION: edema  . Sulfonamide Derivatives     REACTION: rash  . Tramadol     dizziness    Family History  Problem Relation Age of Onset  . Coronary artery disease Father   . Stroke Father   . Diabetes Father   . Diabetes Sister   . Stroke Sister   . Diabetes Brother     BP 122/84 mmHg  Pulse 76  Temp(Src) 98.8 F (37.1 C) (Oral)  Ht 5\' 3"  (1.6 m)  Wt 253 lb (114.76 kg)  BMI 44.83 kg/m2  SpO2 95%  Review of Systems She has myalgias, but no earache ot fever    Objective:   Physical Exam VITAL SIGNS:  See vs page GENERAL: no distress head: no deformity eyes: no periorbital swelling, no  proptosis external nose and ears are normal mouth: no lesion seen LUNGS:  Clear to auscultation.       Assessment & Plan:  URI: new  Patient is advised the following: Patient Instructions  i have sent 2 prescriptions to your pharmacy, for an antibiotic pill and cough medicine.   Please come back for a follow-up appointment in 6 months.

## 2014-09-03 ENCOUNTER — Telehealth: Payer: Self-pay | Admitting: Endocrinology

## 2014-09-03 NOTE — Telephone Encounter (Signed)
Patient had a bad reaction with medication amoxicillin and benzonate it affected he kidneys, please advise

## 2014-09-03 NOTE — Telephone Encounter (Signed)
Left voicemail advising of note below. Requested call back from patient if she would like to discuss.

## 2014-09-03 NOTE — Telephone Encounter (Signed)
please call patient: Options are to see how it goes for 1-2 days, or ret for ov

## 2014-09-03 NOTE — Telephone Encounter (Signed)
See team health note below and please advise, Thanks!

## 2014-09-03 NOTE — Telephone Encounter (Signed)
Team health note dated 08/31/14 at 9:39 am  Caller states she was giving an antibiotic yesterday for a cold and after she took it, she began feeling kidney pain and constant urination. PreDisposition Did not know what to do  CALLER [place]. SHE STATES SHE HAD HEADACHE, DIZZINESS. SHE STATES SHE HAD KIDNEY PAIN. SHE STATES SHE HAS A CHRONIC KIDNEY DISEASE. SHE STATES THAT SHE DID NOT REMEMBER IT DOING HER. SHE HAD A COUGH AND COLD. SHE STATES THAT SHE WENT OUT OF TOWN AND SHE CAME BACK WITH A COLD. SHE STATES SHE HAD A REACTION WITH THE TWO MEDS TOGETHER. NO FEVER. COUGH - WITH PRODUCTION OF PHLEGM THAT IS CLEAR. SHE WAS HAVING SORENESS ALL OVER.  Comments User: Susanne Borders, RN Date/Time Eilene Ghazi Time): 09/01/2014 9:47:21 AM CALLER IS NOT GOING TO TAKE THE TESSALON PERLES ANYMORE AS THAT IS WHAT CAUSED THE HEADACHE SHE FEELS. DID LOOK THE MEDICATION S/E'S UP ON [email or web address] FOR HER. SHE HAS TAKEN THE AMOXICILLIN BEFORE WITHOUT ANY PROBLEMS AND SHE WILL TAKE IT. SHE WILL CALL us BACK IF ANY FURTHER PROBLEMS.

## 2014-09-04 ENCOUNTER — Other Ambulatory Visit: Payer: Self-pay | Admitting: Endocrinology

## 2014-09-04 ENCOUNTER — Telehealth: Payer: Self-pay | Admitting: Endocrinology

## 2014-09-04 MED ORDER — OXYBUTYNIN CHLORIDE 5 MG PO TABS: 5.0000 mg | ORAL_TABLET | Freq: Every day | ORAL | Status: DC

## 2014-09-04 NOTE — Telephone Encounter (Signed)
Left voicemail advising the oxybutynin has been sent. Requested call back if patient would like to discuss

## 2014-09-04 NOTE — Telephone Encounter (Signed)
Pt called returning [facility] phone call and also needs a refill on oxybutyein she would like it sent to [facility] on [address]

## 2014-09-05 ENCOUNTER — Telehealth: Payer: Self-pay | Admitting: Endocrinology

## 2014-09-05 ENCOUNTER — Other Ambulatory Visit (HOSPITAL_COMMUNITY): Payer: Self-pay | Admitting: *Deleted

## 2014-09-05 MED ORDER — LOSARTAN POTASSIUM-HCTZ 100-25 MG PO TABS: 1.0000 | ORAL_TABLET | Freq: Every day | ORAL | Status: DC

## 2014-09-05 NOTE — Telephone Encounter (Signed)
Rx sent to pt's pharmacy

## 2014-09-05 NOTE — Telephone Encounter (Signed)
Patient called and would like to have her blood pressure medication sent to her pharmacy    Pharmacy: [name] at [place]    Thank you

## 2014-09-06 ENCOUNTER — Encounter (HOSPITAL_COMMUNITY)
Admission: RE | Admit: 2014-09-06 | Discharge: 2014-09-06 | Disposition: A | Discharge status: Home / Self Care | Payer: Medicare Other | Source: Ambulatory Visit | Attending: Nephrology | Admitting: Nephrology

## 2014-09-06 DIAGNOSIS — D631 Anemia in chronic kidney disease: Secondary | ICD-10-CM | POA: Diagnosis not present

## 2014-09-06 DIAGNOSIS — Z79899 Other long term (current) drug therapy: Secondary | ICD-10-CM | POA: Insufficient documentation

## 2014-09-06 DIAGNOSIS — Z5181 Encounter for therapeutic drug level monitoring: Secondary | ICD-10-CM | POA: Insufficient documentation

## 2014-09-06 DIAGNOSIS — N183 Chronic kidney disease, stage 3 (moderate): Secondary | ICD-10-CM | POA: Insufficient documentation

## 2014-09-06 LAB — IRON AND TIBC
Iron: 63 ug/dL (ref 28–170)
Saturation Ratios: 29 % (ref 10.4–31.8)
TIBC: 214 ug/dL — AB (ref 250–450)
UIBC: 151 ug/dL

## 2014-09-06 LAB — FERRITIN: Ferritin: 691 ng/mL — ABNORMAL HIGH (ref 11–307)

## 2014-09-06 MED ORDER — EPOETIN ALFA 10000 UNIT/ML IJ SOLN
10000.0000 [IU] | INTRAMUSCULAR | Status: DC
  Administered 2014-09-06: 10000 [IU] via SUBCUTANEOUS

## 2014-09-06 MED ORDER — EPOETIN ALFA 10000 UNIT/ML IJ SOLN
INTRAMUSCULAR | Status: AC
  Filled 2014-09-06: qty 1

## 2014-09-07 LAB — POCT HEMOGLOBIN-HEMACUE: HEMOGLOBIN: 10.8 g/dL — AB (ref 12.0–15.0)

## 2014-09-27 ENCOUNTER — Other Ambulatory Visit: Payer: Self-pay | Admitting: Endocrinology

## 2014-09-28 ENCOUNTER — Other Ambulatory Visit: Payer: Self-pay | Admitting: Endocrinology

## 2014-10-04 ENCOUNTER — Encounter (HOSPITAL_COMMUNITY): Payer: Medicare Other

## 2014-10-11 ENCOUNTER — Encounter (HOSPITAL_COMMUNITY)
Admission: RE | Admit: 2014-10-11 | Discharge: 2014-10-11 | Disposition: A | Discharge status: Home / Self Care | Payer: Medicare Other | Source: Ambulatory Visit | Attending: Nephrology | Admitting: Nephrology

## 2014-10-11 DIAGNOSIS — D631 Anemia in chronic kidney disease: Secondary | ICD-10-CM | POA: Diagnosis not present

## 2014-10-11 DIAGNOSIS — N183 Chronic kidney disease, stage 3 (moderate): Secondary | ICD-10-CM | POA: Diagnosis not present

## 2014-10-11 LAB — IRON AND TIBC
IRON: 52 ug/dL (ref 28–170)
Saturation Ratios: 21 % (ref 10.4–31.8)
TIBC: 249 ug/dL — AB (ref 250–450)
UIBC: 197 ug/dL

## 2014-10-11 LAB — POCT HEMOGLOBIN-HEMACUE: Hemoglobin: 12 g/dL (ref 12.0–15.0)

## 2014-10-11 LAB — FERRITIN: FERRITIN: 751 ng/mL — AB (ref 11–307)

## 2014-10-11 MED ORDER — EPOETIN ALFA 10000 UNIT/ML IJ SOLN: 10000.0000 [IU] | INTRAMUSCULAR | Status: DC

## 2014-10-24 DIAGNOSIS — H1132 Conjunctival hemorrhage, left eye: Secondary | ICD-10-CM | POA: Diagnosis not present

## 2014-10-25 ENCOUNTER — Encounter (HOSPITAL_COMMUNITY)
Admission: RE | Admit: 2014-10-25 | Discharge: 2014-10-25 | Disposition: A | Discharge status: Home / Self Care | Payer: Medicare Other | Source: Ambulatory Visit | Attending: Nephrology | Admitting: Nephrology

## 2014-10-25 ENCOUNTER — Other Ambulatory Visit: Payer: Self-pay | Admitting: Endocrinology

## 2014-10-25 DIAGNOSIS — N183 Chronic kidney disease, stage 3 (moderate): Secondary | ICD-10-CM | POA: Diagnosis not present

## 2014-10-25 DIAGNOSIS — D631 Anemia in chronic kidney disease: Secondary | ICD-10-CM | POA: Insufficient documentation

## 2014-10-25 LAB — POCT HEMOGLOBIN-HEMACUE: Hemoglobin: 10.9 g/dL — ABNORMAL LOW (ref 12.0–15.0)

## 2014-10-25 MED ORDER — EPOETIN ALFA 10000 UNIT/ML IJ SOLN: 10000.0000 [IU] | INTRAMUSCULAR | Status: DC

## 2014-10-25 MED ORDER — EPOETIN ALFA 10000 UNIT/ML IJ SOLN
INTRAMUSCULAR | Status: AC
  Administered 2014-10-25: 10000 [IU] via SUBCUTANEOUS
  Filled 2014-10-25: qty 1

## 2014-10-26 ENCOUNTER — Other Ambulatory Visit: Payer: Self-pay | Admitting: Endocrinology

## 2014-10-30 DIAGNOSIS — M25561 Pain in right knee: Secondary | ICD-10-CM | POA: Diagnosis not present

## 2014-10-30 DIAGNOSIS — M25562 Pain in left knee: Secondary | ICD-10-CM | POA: Diagnosis not present

## 2014-10-31 DIAGNOSIS — N2581 Secondary hyperparathyroidism of renal origin: Secondary | ICD-10-CM | POA: Diagnosis not present

## 2014-10-31 DIAGNOSIS — N189 Chronic kidney disease, unspecified: Secondary | ICD-10-CM | POA: Diagnosis not present

## 2014-10-31 DIAGNOSIS — D631 Anemia in chronic kidney disease: Secondary | ICD-10-CM | POA: Diagnosis not present

## 2014-10-31 DIAGNOSIS — N390 Urinary tract infection, site not specified: Secondary | ICD-10-CM | POA: Diagnosis not present

## 2014-10-31 DIAGNOSIS — N183 Chronic kidney disease, stage 3 (moderate): Secondary | ICD-10-CM | POA: Diagnosis not present

## 2014-11-07 ENCOUNTER — Ambulatory Visit: Payer: Medicare Other | Admitting: Endocrinology

## 2014-11-22 ENCOUNTER — Inpatient Hospital Stay (HOSPITAL_COMMUNITY): Admission: RE | Admit: 2014-11-22 | Payer: Medicare Other | Source: Ambulatory Visit

## 2014-11-24 ENCOUNTER — Other Ambulatory Visit: Payer: Self-pay | Admitting: Endocrinology

## 2014-11-26 ENCOUNTER — Other Ambulatory Visit: Payer: Self-pay | Admitting: Endocrinology

## 2014-11-27 ENCOUNTER — Other Ambulatory Visit (HOSPITAL_COMMUNITY): Payer: Self-pay | Admitting: *Deleted

## 2014-11-28 ENCOUNTER — Encounter (HOSPITAL_COMMUNITY)
Admission: RE | Admit: 2014-11-28 | Discharge: 2014-11-28 | Disposition: A | Discharge status: Home / Self Care | Payer: Medicare Other | Source: Ambulatory Visit | Attending: Nephrology | Admitting: Nephrology

## 2014-11-28 DIAGNOSIS — D631 Anemia in chronic kidney disease: Secondary | ICD-10-CM | POA: Diagnosis not present

## 2014-11-28 DIAGNOSIS — N183 Chronic kidney disease, stage 3 (moderate): Secondary | ICD-10-CM | POA: Diagnosis not present

## 2014-11-28 LAB — IRON AND TIBC
Iron: 73 ug/dL (ref 28–170)
Saturation Ratios: 34 % — ABNORMAL HIGH (ref 10.4–31.8)
TIBC: 217 ug/dL — AB (ref 250–450)
UIBC: 144 ug/dL

## 2014-11-28 LAB — FERRITIN: Ferritin: 764 ng/mL — ABNORMAL HIGH (ref 11–307)

## 2014-11-28 LAB — POCT HEMOGLOBIN-HEMACUE: Hemoglobin: 10.6 g/dL — ABNORMAL LOW (ref 12.0–15.0)

## 2014-11-28 MED ORDER — EPOETIN ALFA 10000 UNIT/ML IJ SOLN
INTRAMUSCULAR | Status: AC
  Administered 2014-11-28: 10000 [IU] via SUBCUTANEOUS
  Filled 2014-11-28: qty 1

## 2014-11-28 MED ORDER — EPOETIN ALFA 10000 UNIT/ML IJ SOLN: 10000.0000 [IU] | INTRAMUSCULAR | Status: DC

## 2014-11-29 ENCOUNTER — Other Ambulatory Visit: Payer: Self-pay | Admitting: Endocrinology

## 2014-12-01 ENCOUNTER — Other Ambulatory Visit: Payer: Self-pay | Admitting: Endocrinology

## 2014-12-03 ENCOUNTER — Other Ambulatory Visit: Payer: Self-pay | Admitting: Endocrinology

## 2014-12-17 ENCOUNTER — Other Ambulatory Visit: Payer: Self-pay | Admitting: Endocrinology

## 2014-12-24 ENCOUNTER — Other Ambulatory Visit: Payer: Self-pay | Admitting: Endocrinology

## 2014-12-26 ENCOUNTER — Encounter (HOSPITAL_COMMUNITY): Payer: Medicare Other

## 2015-01-02 ENCOUNTER — Other Ambulatory Visit: Payer: Self-pay | Admitting: Endocrinology

## 2015-01-02 ENCOUNTER — Encounter (HOSPITAL_COMMUNITY)
Admission: RE | Admit: 2015-01-02 | Discharge: 2015-01-02 | Disposition: A | Discharge status: Home / Self Care | Payer: Medicare Other | Source: Ambulatory Visit | Attending: Nephrology | Admitting: Nephrology

## 2015-01-02 DIAGNOSIS — N183 Chronic kidney disease, stage 3 (moderate): Secondary | ICD-10-CM | POA: Diagnosis not present

## 2015-01-02 DIAGNOSIS — D631 Anemia in chronic kidney disease: Secondary | ICD-10-CM | POA: Insufficient documentation

## 2015-01-02 LAB — IRON AND TIBC
IRON: 64 ug/dL (ref 28–170)
Saturation Ratios: 31 % (ref 10.4–31.8)
TIBC: 204 ug/dL — ABNORMAL LOW (ref 250–450)
UIBC: 140 ug/dL

## 2015-01-02 LAB — POCT HEMOGLOBIN-HEMACUE: HEMOGLOBIN: 10.6 g/dL — AB (ref 12.0–15.0)

## 2015-01-02 LAB — FERRITIN: FERRITIN: 872 ng/mL — AB (ref 11–307)

## 2015-01-02 MED ORDER — EPOETIN ALFA 10000 UNIT/ML IJ SOLN
INTRAMUSCULAR | Status: AC
  Administered 2015-01-02: 10000 [IU] via SUBCUTANEOUS
  Filled 2015-01-02: qty 1

## 2015-01-02 MED ORDER — EPOETIN ALFA 10000 UNIT/ML IJ SOLN
10000.0000 [IU] | INTRAMUSCULAR | Status: DC
  Administered 2015-01-02: 10000 [IU] via SUBCUTANEOUS

## 2015-01-07 DIAGNOSIS — M1711 Unilateral primary osteoarthritis, right knee: Secondary | ICD-10-CM | POA: Diagnosis not present

## 2015-01-07 DIAGNOSIS — R262 Difficulty in walking, not elsewhere classified: Secondary | ICD-10-CM | POA: Diagnosis not present

## 2015-01-07 DIAGNOSIS — M25561 Pain in right knee: Secondary | ICD-10-CM | POA: Diagnosis not present

## 2015-01-16 DIAGNOSIS — M179 Osteoarthritis of knee, unspecified: Secondary | ICD-10-CM | POA: Diagnosis not present

## 2015-01-16 DIAGNOSIS — R262 Difficulty in walking, not elsewhere classified: Secondary | ICD-10-CM | POA: Diagnosis not present

## 2015-01-16 DIAGNOSIS — M25561 Pain in right knee: Secondary | ICD-10-CM | POA: Diagnosis not present

## 2015-01-16 DIAGNOSIS — M1711 Unilateral primary osteoarthritis, right knee: Secondary | ICD-10-CM | POA: Diagnosis not present

## 2015-01-22 ENCOUNTER — Other Ambulatory Visit: Payer: Self-pay | Admitting: Endocrinology

## 2015-01-27 ENCOUNTER — Other Ambulatory Visit: Payer: Self-pay | Admitting: Endocrinology

## 2015-01-30 DIAGNOSIS — M1711 Unilateral primary osteoarthritis, right knee: Secondary | ICD-10-CM | POA: Diagnosis not present

## 2015-01-30 DIAGNOSIS — M25561 Pain in right knee: Secondary | ICD-10-CM | POA: Diagnosis not present

## 2015-01-31 ENCOUNTER — Encounter (HOSPITAL_COMMUNITY): Payer: Medicare Other

## 2015-01-31 ENCOUNTER — Encounter (HOSPITAL_COMMUNITY)
Admission: RE | Admit: 2015-01-31 | Discharge: 2015-01-31 | Disposition: A | Discharge status: Home / Self Care | Payer: Medicare Other | Source: Ambulatory Visit | Attending: Nephrology | Admitting: Nephrology

## 2015-01-31 DIAGNOSIS — D631 Anemia in chronic kidney disease: Secondary | ICD-10-CM | POA: Diagnosis not present

## 2015-01-31 DIAGNOSIS — N183 Chronic kidney disease, stage 3 (moderate): Secondary | ICD-10-CM | POA: Diagnosis not present

## 2015-01-31 LAB — FERRITIN: FERRITIN: 822 ng/mL — AB (ref 11–307)

## 2015-01-31 LAB — IRON AND TIBC
Iron: 74 ug/dL (ref 28–170)
SATURATION RATIOS: 34 % — AB (ref 10.4–31.8)
TIBC: 216 ug/dL — ABNORMAL LOW (ref 250–450)
UIBC: 142 ug/dL

## 2015-01-31 LAB — POCT HEMOGLOBIN-HEMACUE: HEMOGLOBIN: 11.4 g/dL — AB (ref 12.0–15.0)

## 2015-01-31 MED ORDER — EPOETIN ALFA 10000 UNIT/ML IJ SOLN
INTRAMUSCULAR | Status: AC
  Filled 2015-01-31: qty 1

## 2015-01-31 MED ORDER — EPOETIN ALFA 10000 UNIT/ML IJ SOLN
10000.0000 [IU] | INTRAMUSCULAR | Status: DC
  Administered 2015-01-31: 10000 [IU] via SUBCUTANEOUS

## 2015-02-09 ENCOUNTER — Other Ambulatory Visit: Payer: Self-pay | Admitting: Endocrinology

## 2015-02-12 DIAGNOSIS — R262 Difficulty in walking, not elsewhere classified: Secondary | ICD-10-CM | POA: Diagnosis not present

## 2015-02-12 DIAGNOSIS — M1711 Unilateral primary osteoarthritis, right knee: Secondary | ICD-10-CM | POA: Diagnosis not present

## 2015-02-12 DIAGNOSIS — M25561 Pain in right knee: Secondary | ICD-10-CM | POA: Diagnosis not present

## 2015-02-27 DIAGNOSIS — M25561 Pain in right knee: Secondary | ICD-10-CM | POA: Diagnosis not present

## 2015-02-27 DIAGNOSIS — M1711 Unilateral primary osteoarthritis, right knee: Secondary | ICD-10-CM | POA: Diagnosis not present

## 2015-02-28 ENCOUNTER — Encounter (HOSPITAL_COMMUNITY)
Admission: RE | Admit: 2015-02-28 | Discharge: 2015-02-28 | Disposition: A | Discharge status: Home / Self Care | Payer: Medicare Other | Source: Ambulatory Visit | Attending: Nephrology | Admitting: Nephrology

## 2015-02-28 DIAGNOSIS — D631 Anemia in chronic kidney disease: Secondary | ICD-10-CM | POA: Diagnosis not present

## 2015-02-28 DIAGNOSIS — N183 Chronic kidney disease, stage 3 (moderate): Secondary | ICD-10-CM | POA: Diagnosis not present

## 2015-02-28 LAB — IRON AND TIBC
IRON: 52 ug/dL (ref 28–170)
Saturation Ratios: 23 % (ref 10.4–31.8)
TIBC: 224 ug/dL — ABNORMAL LOW (ref 250–450)
UIBC: 172 ug/dL

## 2015-02-28 LAB — POCT HEMOGLOBIN-HEMACUE: Hemoglobin: 10.9 g/dL — ABNORMAL LOW (ref 12.0–15.0)

## 2015-02-28 LAB — FERRITIN: FERRITIN: 877 ng/mL — AB (ref 11–307)

## 2015-02-28 MED ORDER — EPOETIN ALFA 10000 UNIT/ML IJ SOLN
10000.0000 [IU] | INTRAMUSCULAR | Status: DC
  Administered 2015-02-28: 10000 [IU] via SUBCUTANEOUS

## 2015-02-28 MED ORDER — EPOETIN ALFA 10000 UNIT/ML IJ SOLN
INTRAMUSCULAR | Status: AC
  Filled 2015-02-28: qty 1

## 2015-03-03 ENCOUNTER — Other Ambulatory Visit: Payer: Self-pay | Admitting: Endocrinology

## 2015-03-04 ENCOUNTER — Other Ambulatory Visit: Payer: Self-pay | Admitting: Endocrinology

## 2015-03-11 DIAGNOSIS — M1711 Unilateral primary osteoarthritis, right knee: Secondary | ICD-10-CM | POA: Diagnosis not present

## 2015-03-11 DIAGNOSIS — M25561 Pain in right knee: Secondary | ICD-10-CM | POA: Diagnosis not present

## 2015-03-21 ENCOUNTER — Other Ambulatory Visit: Payer: Self-pay | Admitting: Endocrinology

## 2015-03-28 ENCOUNTER — Encounter (HOSPITAL_COMMUNITY)
Admission: RE | Admit: 2015-03-28 | Discharge: 2015-03-28 | Disposition: A | Discharge status: Home / Self Care | Payer: Medicare Other | Source: Ambulatory Visit | Attending: Nephrology | Admitting: Nephrology

## 2015-03-28 DIAGNOSIS — N183 Chronic kidney disease, stage 3 (moderate): Secondary | ICD-10-CM | POA: Diagnosis not present

## 2015-03-28 DIAGNOSIS — D631 Anemia in chronic kidney disease: Secondary | ICD-10-CM | POA: Diagnosis not present

## 2015-03-28 LAB — FERRITIN: Ferritin: 852 ng/mL — ABNORMAL HIGH (ref 11–307)

## 2015-03-28 LAB — IRON AND TIBC
IRON: 61 ug/dL (ref 28–170)
SATURATION RATIOS: 29 % (ref 10.4–31.8)
TIBC: 207 ug/dL — AB (ref 250–450)
UIBC: 146 ug/dL

## 2015-03-28 LAB — POCT HEMOGLOBIN-HEMACUE: Hemoglobin: 10.8 g/dL — ABNORMAL LOW (ref 12.0–15.0)

## 2015-03-28 MED ORDER — EPOETIN ALFA 10000 UNIT/ML IJ SOLN
INTRAMUSCULAR | Status: AC
  Administered 2015-03-28: 10000 [IU] via SUBCUTANEOUS
  Filled 2015-03-28: qty 1

## 2015-03-28 MED ORDER — EPOETIN ALFA 10000 UNIT/ML IJ SOLN
10000.0000 [IU] | INTRAMUSCULAR | Status: DC
  Administered 2015-03-28: 10000 [IU] via SUBCUTANEOUS

## 2015-03-29 ENCOUNTER — Other Ambulatory Visit: Payer: Self-pay | Admitting: Endocrinology

## 2015-03-31 ENCOUNTER — Other Ambulatory Visit: Payer: Self-pay | Admitting: Endocrinology

## 2015-04-01 ENCOUNTER — Encounter: Payer: Self-pay | Admitting: Endocrinology

## 2015-04-01 ENCOUNTER — Ambulatory Visit (INDEPENDENT_AMBULATORY_CARE_PROVIDER_SITE_OTHER): Payer: Medicare Other | Admitting: Endocrinology

## 2015-04-01 VITALS — BP 128/88 | HR 70 | Temp 97.8°F | Ht 63.0 in | Wt 259.0 lb

## 2015-04-01 DIAGNOSIS — E1122 Type 2 diabetes mellitus with diabetic chronic kidney disease: Secondary | ICD-10-CM

## 2015-04-01 DIAGNOSIS — E039 Hypothyroidism, unspecified: Secondary | ICD-10-CM

## 2015-04-01 DIAGNOSIS — I10 Essential (primary) hypertension: Secondary | ICD-10-CM

## 2015-04-01 DIAGNOSIS — E785 Hyperlipidemia, unspecified: Secondary | ICD-10-CM

## 2015-04-01 DIAGNOSIS — M109 Gout, unspecified: Secondary | ICD-10-CM | POA: Diagnosis not present

## 2015-04-01 DIAGNOSIS — N2581 Secondary hyperparathyroidism of renal origin: Secondary | ICD-10-CM

## 2015-04-01 DIAGNOSIS — R079 Chest pain, unspecified: Secondary | ICD-10-CM

## 2015-04-01 LAB — LIPID PANEL
CHOL/HDL RATIO: 3
Cholesterol: 154 mg/dL (ref 0–200)
HDL: 46 mg/dL (ref >39.00)
LDL CALC: 86 mg/dL (ref 0–99)
NonHDL: 108.28
Triglycerides: 112 mg/dL (ref 0.0–149.0)
VLDL: 22.4 mg/dL (ref 0.0–40.0)

## 2015-04-01 LAB — POCT GLYCOSYLATED HEMOGLOBIN (HGB A1C): Hemoglobin A1C: 5.6

## 2015-04-01 LAB — BASIC METABOLIC PANEL
BUN: 26 mg/dL — ABNORMAL HIGH (ref 6–23)
CALCIUM: 10 mg/dL (ref 8.4–10.5)
CO2: 29 mEq/L (ref 19–32)
Chloride: 101 mEq/L (ref 96–112)
Creatinine, Ser: 1.7 mg/dL — ABNORMAL HIGH (ref 0.40–1.20)
GFR: 37.19 mL/min — AB (ref >60.00)
GLUCOSE: 127 mg/dL — AB (ref 70–99)
POTASSIUM: 3.6 meq/L (ref 3.5–5.1)
SODIUM: 137 meq/L (ref 135–145)

## 2015-04-01 LAB — HEPATIC FUNCTION PANEL
ALK PHOS: 111 U/L (ref 39–117)
ALT: 11 U/L (ref 0–35)
AST: 17 U/L (ref 0–37)
Albumin: 3.8 g/dL (ref 3.5–5.2)
BILIRUBIN DIRECT: 0.1 mg/dL (ref 0.0–0.3)
BILIRUBIN TOTAL: 0.7 mg/dL (ref 0.2–1.2)
Total Protein: 6.2 g/dL (ref 6.0–8.3)

## 2015-04-01 LAB — TSH: TSH: 5.66 u[IU]/mL — AB (ref 0.35–4.50)

## 2015-04-01 MED ORDER — LEVOTHYROXINE SODIUM 75 MCG PO TABS: 75.0000 ug | ORAL_TABLET | Freq: Every day | ORAL | Status: DC

## 2015-04-01 NOTE — Progress Notes (Signed)
Subjective:    Patient ID: Stacy Walls, female    DOB: 10-Dec-1932, 81 y.o.   MRN: [id number]  HPI Pt returns for f/u of diabetes mellitus: DM type: 2  Dx'ed: Q000111Q Complications: renal failure and CAD Therapy: 2 oral meds GDM: never DKA: never Severe hypoglycemia: never Pancreatitis: never Other: she has never taken insulin.  She is too ill to undergo weight-loss surgery. Interval history: pt states she feels well in general.  She does not check cbg's Past Medical History  Diagnosis Date  . Chest pain   . Clostridium difficile colitis   . Coronary atherosclerosis of native coronary vessel   . Chronic renal insufficiency   . HLD (hyperlipidemia)   . DM2 (diabetes mellitus, type 2) ([place])   . HTN (hypertension)   . Acute cholecystitis   . Dyshidrosis   . Routine general medical examination at a health care facility   . Secondary hyperparathyroidism (of renal origin)   . Hypothyroidism   . Dizziness   . Cough   . Neck pain   . Neck mass   . GERD (gastroesophageal reflux disease)   . Urinary tract infection   . Urinary incontinence   . Gout   . Osteoporosis   . Allergic rhinitis   . Anemia   . Diabetes mellitus     Past Surgical History  Procedure Laterality Date  . C-section (other)  1977  . L ankle surgery  1988  . Electrocardiogram  02/01/06  . Bone density  08/21/05  . Cholecystectomy    . Total left knee  2007    Social History   Social History  . Marital Status: Married    Spouse Name: N/A  . Number of Children: N/A  . Years of Education: N/A   Occupational History  . Not on file.   Social History Main Topics  . Smoking status: Never Smoker   . Smokeless tobacco: Not on file  . Alcohol Use: No  . Drug Use: No  . Sexual Activity: Not on file   Other Topics Concern  . Not on file   Social History Narrative   Lives with husband and has lived in [place] for over 30 years.     Current Outpatient Prescriptions on File Prior to Visit    Medication Sig Dispense Refill  . allopurinol (ZYLOPRIM) 300 MG tablet TAKE 1 TABLET BY MOUTH EVERY DAY 90 tablet 0  . aspirin (ECOTRIN LOW STRENGTH) 81 MG EC tablet Take 81 mg by mouth daily.      Marland Kitchen atorvastatin (LIPITOR) 80 MG tablet TAKE 1 TABLET BY MOUTH EVERY DAY 90 tablet 0  . epoetin alfa (PROCRIT) 03474 UNIT/ML injection Inject 10,000 Units into the skin every 30 (thirty) days. Dr. Justin Mend    . Ferrous Sulfate (IRON) 325 (65 FE) MG TABS Take 1 tablet by mouth daily.     . furosemide (LASIX) 20 MG tablet TAKE 1 TABLET BY MOUTH EVERY DAY 90 tablet 0  . JANUVIA 100 MG tablet TAKE 1/2 TABLET BY MOUTH EVERY DAY 15 tablet 0  . losartan-hydrochlorothiazide (HYZAAR) 100-25 MG tablet TAKE 1 TABLET BY MOUTH DAILY 90 tablet 0  . omeprazole (PRILOSEC) 20 MG capsule TAKE 1 CAPSULE BY MOUTH EVERY DAY 90 capsule 0  . oxybutynin (DITROPAN) 5 MG tablet TAKE 1 TABLET BY MOUTH EVERY DAY. 90 tablet 0  . triamcinolone ointment (KENALOG) 0.1 % APPLY TOPICALLY TO THE AFFECTED AREA THREE TIMES DAILY AS NEEDED FOR ITCHING 454 g 0  No current facility-administered medications on file prior to visit.    Allergies  Allergen Reactions  . Azithromycin   . Pioglitazone     REACTION: edema  . Sulfonamide Derivatives     REACTION: rash  . Tramadol     dizziness    Family History  Problem Relation Age of Onset  . Coronary artery disease Father   . Stroke Father   . Diabetes Father   . Diabetes Sister   . Stroke Sister   . Diabetes Brother     BP 128/88 mmHg  Pulse 70  Temp(Src) 97.8 F (36.6 C) (Oral)  Ht 5\' 3"  (1.6 m)  Wt 259 lb (117.482 kg)  BMI 45.89 kg/m2  SpO2 94%   Review of Systems Denies weight change.      Objective:   Physical Exam VITAL SIGNS:  See vs page GENERAL: no distress Pulses: dorsalis pedis intact bilat.   MSK: no deformity of the feet CV: 1+ bilat leg edema Skin:  no ulcer on the feet.  normal color and temp on the feet. Neuro: sensation is intact to touch on the  feet.    Lab Results  Component Value Date   HGBA1C 5.6 04/01/2015   Lab Results  Component Value Date   TSH 5.66* 04/01/2015      Assessment & Plan:  Hypothyroidism: she needs increased rx.  i have sent a prescription to your pharmacy.  DM: well-controlled: please stop taking the bromocriptine pill.   Subjective:   Patient here for Medicare annual wellness visit and management of other chronic and acute problems.  Risk factors: advanced age.    Roster of Physicians Providing Medical Care to Patient:  See "snapshot"   Activities of Daily Living: In your present state of health, do you have any difficulty performing the following activities?:  Preparing food and eating?: No  Bathing yourself: No  Getting dressed: No  Using the toilet: No  Moving around from place to place: No  In the past year have you fallen or had a near fall?: No    Home Safety: Has smoke detector and wears seat belts. No firearms.  Diet and Exercise  Current exercise habits: pt says good Dietary issues discussed: pt reports a healthy diet   Depression Screen  Q1: Over the past two weeks, have you felt down, depressed or hopeless?no  Q2: Over the past two weeks, have you felt little interest or pleasure in doing things? no   The following portions of the patient's history were reviewed and updated as appropriate: allergies, current medications, past family history, past medical history, past social history, past surgical history and problem list.   Review of Systems  Denies hearing loss, and visual loss.   Objective:   Vision:  Sees opthalmologist Hearing: grossly normal Body mass index:  See vs page Msk: pt easily and quickly performs "get-up-and-go" from a sitting position Cognitive Impairment Assessment: cognition, memory and judgment appear normal.  remembers 1/3 at 5 minutes (? effort).  excellent recall.  can easily read and write a sentence.  alert and oriented x 3.     Assessment:     Medicare wellness utd on preventive parameters.     Plan:   During the course of the visit the patient was educated and counseled about appropriate screening and preventive services including:        Fall prevention   Screening mammography  Bone densitometry screening  Diabetes screening  Nutrition counseling   Vaccines /  LABS Zostavax / Pneumococcal Vaccine  today   Patient Instructions (the written plan) was given to the patient.

## 2015-04-01 NOTE — Patient Instructions (Addendum)
please consider these measures for your health:  minimize alcohol.  do not use tobacco products.  have a colonoscopy at least every 10 years from age 79.  Women should have an annual mammogram from age 5.  keep firearms safely stored.  always use seat belts.  have working smoke alarms in your home.  see an eye doctor and dentist regularly.  never drive under the influence of alcohol or drugs (including prescription drugs).  please let me know what your wishes would be, if artificial life support measures should become necessary.  it is critically important to prevent falling down (keep floor areas well-lit, dry, and free of loose objects.  If you have a cane, walker, or wheelchair, you should use it, even for short trips around the house.  Also, try not to rush).   blood tests are requested for you today.  We'll let you know about the results. Please stop taking the bromocriptine pill.  good diet and exercise significantly improve the control of your diabetes.  please let me know if you wish to be referred to a dietician.  high blood sugar is very risky to your health.  you should see an eye doctor and dentist every year.  It is very important to get all recommended vaccinations.  Please come back for a follow-up appointment in 6 months.

## 2015-04-04 ENCOUNTER — Telehealth: Payer: Self-pay | Admitting: Endocrinology

## 2015-04-04 MED ORDER — SITAGLIPTIN PHOSPHATE 100 MG PO TABS: 50.0000 mg | ORAL_TABLET | Freq: Every day | ORAL | Status: DC

## 2015-04-04 NOTE — Telephone Encounter (Signed)
Rx submitted per pt's request.  

## 2015-04-04 NOTE — Telephone Encounter (Signed)
Patient called stating she would like a refill on her medication   Rx: [place]: [name]  Thank you

## 2015-04-08 ENCOUNTER — Telehealth: Payer: Self-pay | Admitting: Endocrinology

## 2015-04-08 NOTE — Telephone Encounter (Signed)
Do you want to go back to the lower amount?  That would be ok with me. Please let us know.

## 2015-04-08 NOTE — Telephone Encounter (Signed)
See note below and please advise, Thanks! 

## 2015-04-08 NOTE — Telephone Encounter (Signed)
Left a voicemail advising of note below. Requested a call back from the pt to discuss.

## 2015-04-08 NOTE — Telephone Encounter (Signed)
Pt feels like the thyroid med is too strong it caused a yeast infection, please advise

## 2015-04-10 ENCOUNTER — Telehealth: Payer: Self-pay

## 2015-04-10 MED ORDER — LEVOTHYROXINE SODIUM 50 MCG PO TABS: 50.0000 ug | ORAL_TABLET | Freq: Every day | ORAL | Status: DC

## 2015-04-10 NOTE — Telephone Encounter (Signed)
Ok, i have sent a prescription to your pharmacy  

## 2015-04-10 NOTE — Telephone Encounter (Signed)
Pt called stating she needs to be changed back to the lower dosage of her levothyroxine medication. Please advise on which dosage.  Thanks!

## 2015-04-25 ENCOUNTER — Encounter (HOSPITAL_COMMUNITY)
Admission: RE | Admit: 2015-04-25 | Discharge: 2015-04-25 | Disposition: A | Discharge status: Home / Self Care | Payer: Medicare Other | Source: Ambulatory Visit | Attending: Nephrology | Admitting: Nephrology

## 2015-04-25 DIAGNOSIS — N183 Chronic kidney disease, stage 3 (moderate): Secondary | ICD-10-CM | POA: Diagnosis not present

## 2015-04-25 DIAGNOSIS — D631 Anemia in chronic kidney disease: Secondary | ICD-10-CM | POA: Diagnosis not present

## 2015-04-25 LAB — IRON AND TIBC
Iron: 55 ug/dL (ref 28–170)
SATURATION RATIOS: 27 % (ref 10.4–31.8)
TIBC: 206 ug/dL — ABNORMAL LOW (ref 250–450)
UIBC: 151 ug/dL

## 2015-04-25 LAB — POCT HEMOGLOBIN-HEMACUE: Hemoglobin: 11.1 g/dL — ABNORMAL LOW (ref 12.0–15.0)

## 2015-04-25 LAB — FERRITIN: FERRITIN: 806 ng/mL — AB (ref 11–307)

## 2015-04-25 MED ORDER — EPOETIN ALFA 10000 UNIT/ML IJ SOLN
10000.0000 [IU] | INTRAMUSCULAR | Status: DC
  Administered 2015-04-25: 10000 [IU] via SUBCUTANEOUS

## 2015-04-25 MED ORDER — EPOETIN ALFA 10000 UNIT/ML IJ SOLN
INTRAMUSCULAR | Status: AC
  Filled 2015-04-25: qty 1

## 2015-05-23 ENCOUNTER — Encounter (HOSPITAL_COMMUNITY)
Admission: RE | Admit: 2015-05-23 | Discharge: 2015-05-23 | Disposition: A | Discharge status: Home / Self Care | Payer: Medicare Other | Source: Ambulatory Visit | Attending: Nephrology | Admitting: Nephrology

## 2015-05-23 DIAGNOSIS — N183 Chronic kidney disease, stage 3 (moderate): Secondary | ICD-10-CM | POA: Insufficient documentation

## 2015-05-23 DIAGNOSIS — D631 Anemia in chronic kidney disease: Secondary | ICD-10-CM | POA: Insufficient documentation

## 2015-05-23 MED ORDER — EPOETIN ALFA 10000 UNIT/ML IJ SOLN: 10000.0000 [IU] | INTRAMUSCULAR | Status: DC

## 2015-05-30 ENCOUNTER — Encounter (HOSPITAL_COMMUNITY)
Admission: RE | Admit: 2015-05-30 | Discharge: 2015-05-30 | Disposition: A | Discharge status: Home / Self Care | Payer: Medicare Other | Source: Ambulatory Visit | Attending: Nephrology | Admitting: Nephrology

## 2015-05-30 ENCOUNTER — Telehealth: Payer: Self-pay | Admitting: Endocrinology

## 2015-05-30 ENCOUNTER — Other Ambulatory Visit: Payer: Self-pay | Admitting: Endocrinology

## 2015-05-30 DIAGNOSIS — D631 Anemia in chronic kidney disease: Secondary | ICD-10-CM | POA: Diagnosis not present

## 2015-05-30 DIAGNOSIS — N183 Chronic kidney disease, stage 3 (moderate): Secondary | ICD-10-CM | POA: Diagnosis not present

## 2015-05-30 LAB — IRON AND TIBC
Iron: 59 ug/dL (ref 28–170)
SATURATION RATIOS: 30 % (ref 10.4–31.8)
TIBC: 195 ug/dL — AB (ref 250–450)
UIBC: 136 ug/dL

## 2015-05-30 LAB — POCT HEMOGLOBIN-HEMACUE: HEMOGLOBIN: 11.6 g/dL — AB (ref 12.0–15.0)

## 2015-05-30 LAB — FERRITIN: FERRITIN: 851 ng/mL — AB (ref 11–307)

## 2015-05-30 MED ORDER — EPOETIN ALFA 10000 UNIT/ML IJ SOLN
INTRAMUSCULAR | Status: AC
  Filled 2015-05-30: qty 1

## 2015-05-30 MED ORDER — OXYBUTYNIN CHLORIDE 5 MG PO TABS: 5.0000 mg | ORAL_TABLET | Freq: Every day | ORAL | Status: DC

## 2015-05-30 MED ORDER — EPOETIN ALFA 10000 UNIT/ML IJ SOLN
10000.0000 [IU] | INTRAMUSCULAR | Status: DC
  Administered 2015-05-30: 10000 [IU] via SUBCUTANEOUS

## 2015-05-30 NOTE — Telephone Encounter (Signed)
Rx submitted per pt's request.  

## 2015-05-30 NOTE — Telephone Encounter (Signed)
[name] called pt needs refill of Oxybutynin 5 mg, [place]

## 2015-06-12 ENCOUNTER — Ambulatory Visit (INDEPENDENT_AMBULATORY_CARE_PROVIDER_SITE_OTHER): Payer: Medicare Other | Admitting: Endocrinology

## 2015-06-12 ENCOUNTER — Encounter: Payer: Self-pay | Admitting: Endocrinology

## 2015-06-12 VITALS — BP 132/87 | HR 65 | Temp 97.9°F | Ht 63.0 in | Wt 243.0 lb

## 2015-06-12 DIAGNOSIS — N3000 Acute cystitis without hematuria: Secondary | ICD-10-CM | POA: Diagnosis not present

## 2015-06-12 LAB — URINALYSIS, ROUTINE W REFLEX MICROSCOPIC
Bilirubin Urine: NEGATIVE
Hgb urine dipstick: NEGATIVE
KETONES UR: NEGATIVE
Nitrite: NEGATIVE
PH: 7.5 (ref 5.0–8.0)
RBC / HPF: NONE SEEN (ref 0–?)
SPECIFIC GRAVITY, URINE: 1.01 (ref 1.000–1.030)
TOTAL PROTEIN, URINE-UPE24: NEGATIVE
URINE GLUCOSE: NEGATIVE
UROBILINOGEN UA: 0.2 (ref 0.0–1.0)

## 2015-06-12 NOTE — Patient Instructions (Signed)
A urine test is requested for you today.  We'll let you know about the results.

## 2015-06-12 NOTE — Progress Notes (Signed)
Subjective:    Patient ID: Stacy Walls, female    DOB: 06-Nov-1932, 82 y.o.   MRN: [id number]  HPI Pt states few days of slight pain at the right flank, but no assoc hematuria.  She says last UTI was in 2015.   Past Medical History  Diagnosis Date  . Chest pain   . Clostridium difficile colitis   . Coronary atherosclerosis of native coronary vessel   . Chronic renal insufficiency   . HLD (hyperlipidemia)   . DM2 (diabetes mellitus, type 2) ([place])   . HTN (hypertension)   . Acute cholecystitis   . Dyshidrosis   . Routine general medical examination at a health care facility   . Secondary hyperparathyroidism (of renal origin)   . Hypothyroidism   . Dizziness   . Cough   . Neck pain   . Neck mass   . GERD (gastroesophageal reflux disease)   . Urinary tract infection   . Urinary incontinence   . Gout   . Osteoporosis   . Allergic rhinitis   . Anemia   . Diabetes mellitus     Past Surgical History  Procedure Laterality Date  . C-section (other)  1977  . L ankle surgery  1988  . Electrocardiogram  02/01/06  . Bone density  08/21/05  . Cholecystectomy    . Total left knee  2007    Social History   Social History  . Marital Status: Married    Spouse Name: N/A  . Number of Children: N/A  . Years of Education: N/A   Occupational History  . Not on file.   Social History Main Topics  . Smoking status: Never Smoker   . Smokeless tobacco: Not on file  . Alcohol Use: No  . Drug Use: No  . Sexual Activity: Not on file   Other Topics Concern  . Not on file   Social History Narrative   Lives with husband and has lived in [place] for over 30 years.     Current Outpatient Prescriptions on File Prior to Visit  Medication Sig Dispense Refill  . allopurinol (ZYLOPRIM) 300 MG tablet TAKE 1 TABLET BY MOUTH EVERY DAY 90 tablet 0  . aspirin (ECOTRIN LOW STRENGTH) 81 MG EC tablet Take 81 mg by mouth daily.      Marland Kitchen atorvastatin (LIPITOR) 80 MG tablet TAKE 1 TABLET  BY MOUTH EVERY DAY 90 tablet 0  . epoetin alfa (PROCRIT) 60454 UNIT/ML injection Inject 10,000 Units into the skin every 30 (thirty) days. Dr. Justin Mend    . Ferrous Sulfate (IRON) 325 (65 FE) MG TABS Take 1 tablet by mouth daily.     . furosemide (LASIX) 20 MG tablet TAKE 1 TABLET BY MOUTH EVERY DAY 90 tablet 0  . levothyroxine (SYNTHROID, LEVOTHROID) 50 MCG tablet Take 1 tablet (50 mcg total) by mouth daily before breakfast. 30 tablet 11  . losartan-hydrochlorothiazide (HYZAAR) 100-25 MG tablet TAKE 1 TABLET BY MOUTH DAILY 90 tablet 0  . omeprazole (PRILOSEC) 20 MG capsule TAKE 1 CAPSULE BY MOUTH EVERY DAY 90 capsule 0  . oxybutynin (DITROPAN) 5 MG tablet Take 1 tablet (5 mg total) by mouth daily. 90 tablet 0  . sitaGLIPtin (JANUVIA) 100 MG tablet Take 0.5 tablets (50 mg total) by mouth daily. 15 tablet 5  . triamcinolone ointment (KENALOG) 0.1 % APPLY TOPICALLY TO THE AFFECTED AREA THREE TIMES DAILY AS NEEDED FOR ITCHING 454 g 0   No current facility-administered medications on file prior to  visit.    Allergies  Allergen Reactions  . Azithromycin   . Pioglitazone     REACTION: edema  . Sulfonamide Derivatives     REACTION: rash  . Tramadol     dizziness    Family History  Problem Relation Age of Onset  . Coronary artery disease Father   . Stroke Father   . Diabetes Father   . Diabetes Sister   . Stroke Sister   . Diabetes Brother     BP 132/87 mmHg  Pulse 65  Temp(Src) 97.9 F (36.6 C) (Oral)  Ht 5\' 3"  (1.6 m)  Wt 243 lb (110.224 kg)  BMI 43.06 kg/m2  SpO2 97%  Review of Systems She has slight left otalgia, but no fever.      Objective:   Physical Exam VITAL SIGNS:  See vs page GENERAL: no distress left tm is normal.  Left flank: nontender.     UA: few wbc    Assessment & Plan:  Flank pain, new, uncertain etiology Pyuria, mild.    Patient is advised the following: Patient Instructions  A urine test is requested for you today.  We'll let you know about the  results.  addendum: we'll wait for C/S.

## 2015-06-13 ENCOUNTER — Telehealth: Payer: Self-pay | Admitting: Endocrinology

## 2015-06-13 NOTE — Telephone Encounter (Signed)
[facility] note dated 06/12/15 4:56 Caller states she never heard from office if she has a uti.

## 2015-06-13 NOTE — Telephone Encounter (Signed)
Pt calling requesting test results.  

## 2015-06-13 NOTE — Telephone Encounter (Signed)
Patient called stating that she had seen Dr. Loanne Drilling on 2.22.17 and forgot to mention that she has been having pain in her head where her ear is   Please advise    Thank you

## 2015-06-14 LAB — CULTURE, URINE COMPREHENSIVE
COLONY COUNT: NO GROWTH
ORGANISM ID, BACTERIA: NO GROWTH

## 2015-06-14 NOTE — Telephone Encounter (Signed)
Notes Recorded by Renato Shin, MD on 06/14/2015 at 10:40 AM please call patient: We didn't find anything on the urine. Are your symptoms better?

## 2015-06-14 NOTE — Telephone Encounter (Signed)
Message left to call the office.    KP 

## 2015-06-14 NOTE — Telephone Encounter (Signed)
Spoke with patient and she said her symptoms are much better, No concerns at this time.    KP

## 2015-06-17 ENCOUNTER — Ambulatory Visit: Payer: Medicare Other | Admitting: Endocrinology

## 2015-06-17 DIAGNOSIS — H2513 Age-related nuclear cataract, bilateral: Secondary | ICD-10-CM | POA: Diagnosis not present

## 2015-06-17 DIAGNOSIS — H40033 Anatomical narrow angle, bilateral: Secondary | ICD-10-CM | POA: Diagnosis not present

## 2015-06-17 NOTE — Telephone Encounter (Signed)
I contacted the pt. She stated her ear has been feeling much better and did not need any assistance at this time.

## 2015-06-22 ENCOUNTER — Other Ambulatory Visit: Payer: Self-pay | Admitting: Endocrinology

## 2015-06-26 ENCOUNTER — Other Ambulatory Visit (HOSPITAL_COMMUNITY): Payer: Self-pay | Admitting: *Deleted

## 2015-06-26 DIAGNOSIS — Z96652 Presence of left artificial knee joint: Secondary | ICD-10-CM | POA: Diagnosis not present

## 2015-06-26 DIAGNOSIS — M1711 Unilateral primary osteoarthritis, right knee: Secondary | ICD-10-CM | POA: Diagnosis not present

## 2015-06-26 DIAGNOSIS — M179 Osteoarthritis of knee, unspecified: Secondary | ICD-10-CM | POA: Diagnosis not present

## 2015-06-26 DIAGNOSIS — R262 Difficulty in walking, not elsewhere classified: Secondary | ICD-10-CM | POA: Diagnosis not present

## 2015-06-27 ENCOUNTER — Encounter (HOSPITAL_COMMUNITY)
Admission: RE | Admit: 2015-06-27 | Discharge: 2015-06-27 | Disposition: A | Discharge status: Home / Self Care | Payer: Medicare Other | Source: Ambulatory Visit | Attending: Nephrology | Admitting: Nephrology

## 2015-06-27 DIAGNOSIS — D631 Anemia in chronic kidney disease: Secondary | ICD-10-CM | POA: Insufficient documentation

## 2015-06-27 DIAGNOSIS — N183 Chronic kidney disease, stage 3 (moderate): Secondary | ICD-10-CM | POA: Diagnosis not present

## 2015-06-27 LAB — POCT HEMOGLOBIN-HEMACUE: Hemoglobin: 10.9 g/dL — ABNORMAL LOW (ref 12.0–15.0)

## 2015-06-27 MED ORDER — EPOETIN ALFA 10000 UNIT/ML IJ SOLN
INTRAMUSCULAR | Status: AC
  Administered 2015-06-27: 10000 [IU]
  Filled 2015-06-27: qty 1

## 2015-06-27 MED ORDER — EPOETIN ALFA 10000 UNIT/ML IJ SOLN: 10000.0000 [IU] | INTRAMUSCULAR | Status: DC

## 2015-06-28 ENCOUNTER — Other Ambulatory Visit: Payer: Self-pay | Admitting: Endocrinology

## 2015-07-08 DIAGNOSIS — M25562 Pain in left knee: Secondary | ICD-10-CM | POA: Diagnosis not present

## 2015-07-08 DIAGNOSIS — R2689 Other abnormalities of gait and mobility: Secondary | ICD-10-CM | POA: Diagnosis not present

## 2015-07-08 DIAGNOSIS — M1712 Unilateral primary osteoarthritis, left knee: Secondary | ICD-10-CM | POA: Diagnosis not present

## 2015-07-25 ENCOUNTER — Encounter (HOSPITAL_COMMUNITY)
Admission: RE | Admit: 2015-07-25 | Discharge: 2015-07-25 | Disposition: A | Discharge status: Home / Self Care | Payer: Medicare Other | Source: Ambulatory Visit | Attending: Nephrology | Admitting: Nephrology

## 2015-07-25 DIAGNOSIS — N183 Chronic kidney disease, stage 3 (moderate): Secondary | ICD-10-CM | POA: Insufficient documentation

## 2015-07-25 DIAGNOSIS — D631 Anemia in chronic kidney disease: Secondary | ICD-10-CM | POA: Insufficient documentation

## 2015-07-25 LAB — IRON AND TIBC
Iron: 57 ug/dL (ref 28–170)
Saturation Ratios: 31 % (ref 10.4–31.8)
TIBC: 185 ug/dL — AB (ref 250–450)
UIBC: 128 ug/dL

## 2015-07-25 LAB — POCT HEMOGLOBIN-HEMACUE: HEMOGLOBIN: 9.9 g/dL — AB (ref 12.0–15.0)

## 2015-07-25 LAB — FERRITIN: FERRITIN: 877 ng/mL — AB (ref 11–307)

## 2015-07-25 MED ORDER — EPOETIN ALFA 10000 UNIT/ML IJ SOLN
INTRAMUSCULAR | Status: AC
  Filled 2015-07-25: qty 1

## 2015-07-25 MED ORDER — EPOETIN ALFA 10000 UNIT/ML IJ SOLN
10000.0000 [IU] | INTRAMUSCULAR | Status: DC
  Administered 2015-07-25: 10000 [IU] via SUBCUTANEOUS

## 2015-07-31 ENCOUNTER — Ambulatory Visit (INDEPENDENT_AMBULATORY_CARE_PROVIDER_SITE_OTHER): Payer: Medicare Other | Admitting: Internal Medicine

## 2015-07-31 VITALS — BP 136/92 | HR 79 | Temp 98.5°F | Resp 16 | Wt 250.0 lb

## 2015-07-31 DIAGNOSIS — E1122 Type 2 diabetes mellitus with diabetic chronic kidney disease: Secondary | ICD-10-CM

## 2015-07-31 DIAGNOSIS — J019 Acute sinusitis, unspecified: Secondary | ICD-10-CM | POA: Diagnosis not present

## 2015-07-31 DIAGNOSIS — N183 Chronic kidney disease, stage 3 (moderate): Secondary | ICD-10-CM

## 2015-07-31 DIAGNOSIS — I10 Essential (primary) hypertension: Secondary | ICD-10-CM

## 2015-07-31 MED ORDER — LEVOFLOXACIN 250 MG PO TABS: 250.0000 mg | ORAL_TABLET | Freq: Every day | ORAL | Status: DC

## 2015-07-31 NOTE — Progress Notes (Signed)
Pre visit review using our clinic review tool, if applicable. No additional management support is needed unless otherwise documented below in the visit note. 

## 2015-07-31 NOTE — Patient Instructions (Signed)
Please take all new medication as prescribed - the antibiotic  Please continue all other medications as before, and refills have been done if requested.  Please have the pharmacy call with any other refills you may need.  Please keep your appointments with your specialists as you may have planned   

## 2015-08-01 NOTE — Assessment & Plan Note (Signed)
stable overall by history and exam, recent data reviewed with pt, and pt to continue medical treatment as before,  to f/u any worsening symptoms or concerns BP Readings from Last 3 Encounters:  07/31/15 136/92  07/25/15 130/62  06/27/15 103/47

## 2015-08-01 NOTE — Assessment & Plan Note (Signed)
stable overall by history and exam, recent data reviewed with pt, and pt to continue medical treatment as before,  to f/u any worsening symptoms or concerns Lab Results  Component Value Date   HGBA1C 5.6 04/01/2015   Pt to call for onset polys or cbg > 200 with illness

## 2015-08-01 NOTE — Progress Notes (Signed)
Subjective:    Patient ID: Stacy Walls, female    DOB: 05/05/1932, 82 y.o.   MRN: [id number]  HPI   Here with 4 days acute onset fever, facial pain, pressure, headache, general weakness and malaise, and greenish d/c, with mild ST and cough, but pt denies chest pain, wheezing, increased sob or doe, orthopnea, PND, increased LE swelling, palpitations, dizziness or syncope. Pt denies new neurological symptoms such as new headache, or facial or extremity weakness or numbness   Pt denies polydipsia, polyuria Past Medical History  Diagnosis Date  . Chest pain   . Clostridium difficile colitis   . Coronary atherosclerosis of native coronary vessel   . Chronic renal insufficiency   . HLD (hyperlipidemia)   . DM2 (diabetes mellitus, type 2) ([place])   . HTN (hypertension)   . Acute cholecystitis   . Dyshidrosis   . Routine general medical examination at a health care facility   . Secondary hyperparathyroidism (of renal origin)   . Hypothyroidism   . Dizziness   . Cough   . Neck pain   . Neck mass   . GERD (gastroesophageal reflux disease)   . Urinary tract infection   . Urinary incontinence   . Gout   . Osteoporosis   . Allergic rhinitis   . Anemia   . Diabetes mellitus    Past Surgical History  Procedure Laterality Date  . C-section (other)  1977  . L ankle surgery  1988  . Electrocardiogram  02/01/06  . Bone density  08/21/05  . Cholecystectomy    . Total left knee  2007    reports that she has never smoked. She does not have any smokeless tobacco history on file. She reports that she does not drink alcohol or use illicit drugs. family history includes Coronary artery disease in her father; Diabetes in her brother, father, and sister; Stroke in her father and sister. Allergies  Allergen Reactions  . Azithromycin   . Pioglitazone     REACTION: edema  . Sulfonamide Derivatives     REACTION: rash  . Tramadol     dizziness   Current Outpatient Prescriptions on File Prior  to Visit  Medication Sig Dispense Refill  . allopurinol (ZYLOPRIM) 300 MG tablet TAKE 1 TABLET BY MOUTH EVERY DAY 90 tablet 0  . aspirin (ECOTRIN LOW STRENGTH) 81 MG EC tablet Take 81 mg by mouth daily.      Marland Kitchen atorvastatin (LIPITOR) 80 MG tablet TAKE 1 TABLET BY MOUTH EVERY DAY 90 tablet 0  . epoetin alfa (PROCRIT) 29562 UNIT/ML injection Inject 10,000 Units into the skin every 30 (thirty) days. Dr. Justin Mend    . Ferrous Sulfate (IRON) 325 (65 FE) MG TABS Take 1 tablet by mouth daily.     . furosemide (LASIX) 20 MG tablet TAKE 1 TABLET BY MOUTH EVERY DAY 90 tablet 0  . levothyroxine (SYNTHROID, LEVOTHROID) 50 MCG tablet Take 1 tablet (50 mcg total) by mouth daily before breakfast. 30 tablet 11  . losartan-hydrochlorothiazide (HYZAAR) 100-25 MG tablet TAKE 1 TABLET BY MOUTH DAILY 90 tablet 0  . omeprazole (PRILOSEC) 20 MG capsule TAKE 1 CAPSULE BY MOUTH EVERY DAY 90 capsule 0  . oxybutynin (DITROPAN) 5 MG tablet Take 1 tablet (5 mg total) by mouth daily. 90 tablet 0  . sitaGLIPtin (JANUVIA) 100 MG tablet Take 0.5 tablets (50 mg total) by mouth daily. 15 tablet 5  . triamcinolone ointment (KENALOG) 0.1 % APPLY TOPICALLY TO THE AFFECTED AREA THREE  TIMES DAILY AS NEEDED FOR ITCHING 454 g 0   Current Facility-Administered Medications on File Prior to Visit  Medication Dose Route Frequency Provider Last Rate Last Dose  . epoetin alfa (EPOGEN,PROCRIT) injection 10,000 Units  10,000 Units Subcutaneous Q28 days Edrick Oh, MD   10,000 Units at 07/25/15 1307   Review of Systems  Constitutional: Negative for unusual diaphoresis or night sweats HENT: Negative for ear swelling or discharge Eyes: Negative for worsening visual haziness  Respiratory: Negative for choking and stridor.   Gastrointestinal: Negative for distension or worsening eructation Genitourinary: Negative for retention or change in urine volume.  Musculoskeletal: Negative for other MSK pain or swelling Skin: Negative for color change and  worsening wound Neurological: Negative for tremors and numbness other than noted  Psychiatric/Behavioral: Negative for decreased concentration or agitation other than above       Objective:   Physical Exam BP 136/92 mmHg  Pulse 79  Temp(Src) 98.5 F (36.9 C) (Oral)  Resp 16  Wt 250 lb (113.399 kg)  SpO2 95% VS noted, mild ill Constitutional: Pt appears in no apparent distress HENT: Head: NCAT.  Right Ear: External ear normal.  Left Ear: External ear normal.  Bilat tm's with mild erythema.  Max sinus areas mild tender.  Pharynx with mild erythema, no exudate Bilat tm's with mild erythema.  Max sinus areas mild tender.  Pharynx with mild erythema, no exudate Eyes: . Pupils are equal, round, and reactive to light. Conjunctivae and EOM are normal Neck: Normal range of motion. Neck supple.  Cardiovascular: Normal rate and regular rhythm.   Pulmonary/Chest: Effort normal and breath sounds without rales or wheezing.  Neurological: Pt is alert. Not confused , motor grossly intact Skin: Skin is warm. No rash, no LE edema Psychiatric: Pt behavior is normal. No agitation. mild nervous    Assessment & Plan:

## 2015-08-01 NOTE — Assessment & Plan Note (Signed)
Mild to mod, for antibx course,  to f/u any worsening symptoms or concerns 

## 2015-08-14 DIAGNOSIS — R2689 Other abnormalities of gait and mobility: Secondary | ICD-10-CM | POA: Diagnosis not present

## 2015-08-14 DIAGNOSIS — M25561 Pain in right knee: Secondary | ICD-10-CM | POA: Diagnosis not present

## 2015-08-14 DIAGNOSIS — M1711 Unilateral primary osteoarthritis, right knee: Secondary | ICD-10-CM | POA: Diagnosis not present

## 2015-08-19 ENCOUNTER — Telehealth: Payer: Self-pay | Admitting: Endocrinology

## 2015-08-19 NOTE — Telephone Encounter (Signed)
It is safe to take  B-12 pill by mouth When you come back next time, I would be happy to test this in your blood

## 2015-08-19 NOTE — Telephone Encounter (Signed)
Patient would like to know if she need to start taking Vitamin B 12, please advise

## 2015-08-19 NOTE — Telephone Encounter (Signed)
Spoke with Stacy Walls explained it was safe to take B 12 pill by mouth per Dr Loanne Drilling, and that Dr Loanne Drilling stated it could be cked in blood on next visit

## 2015-08-22 ENCOUNTER — Inpatient Hospital Stay (HOSPITAL_COMMUNITY): Admission: RE | Admit: 2015-08-22 | Payer: Medicare Other | Source: Ambulatory Visit

## 2015-08-23 ENCOUNTER — Encounter (HOSPITAL_COMMUNITY)
Admission: RE | Admit: 2015-08-23 | Discharge: 2015-08-23 | Disposition: A | Discharge status: Home / Self Care | Payer: Medicare Other | Source: Ambulatory Visit | Attending: Nephrology | Admitting: Nephrology

## 2015-08-23 DIAGNOSIS — N183 Chronic kidney disease, stage 3 (moderate): Secondary | ICD-10-CM | POA: Diagnosis not present

## 2015-08-23 DIAGNOSIS — D631 Anemia in chronic kidney disease: Secondary | ICD-10-CM | POA: Insufficient documentation

## 2015-08-23 LAB — IRON AND TIBC
Iron: 84 ug/dL (ref 28–170)
SATURATION RATIOS: 43 % — AB (ref 10.4–31.8)
TIBC: 195 ug/dL — AB (ref 250–450)
UIBC: 110 ug/dL

## 2015-08-23 LAB — FERRITIN: FERRITIN: 195 ng/mL (ref 11–307)

## 2015-08-23 LAB — POCT HEMOGLOBIN-HEMACUE: HEMOGLOBIN: 10.6 g/dL — AB (ref 12.0–15.0)

## 2015-08-23 MED ORDER — EPOETIN ALFA 10000 UNIT/ML IJ SOLN
INTRAMUSCULAR | Status: AC
  Administered 2015-08-23: 10000 [IU]
  Filled 2015-08-23: qty 1

## 2015-08-23 MED ORDER — EPOETIN ALFA 10000 UNIT/ML IJ SOLN: 10000.0000 [IU] | INTRAMUSCULAR | Status: DC

## 2015-08-26 ENCOUNTER — Other Ambulatory Visit: Payer: Self-pay | Admitting: Endocrinology

## 2015-08-29 ENCOUNTER — Other Ambulatory Visit: Payer: Self-pay | Admitting: Endocrinology

## 2015-08-30 ENCOUNTER — Inpatient Hospital Stay (HOSPITAL_COMMUNITY): Admission: RE | Admit: 2015-08-30 | Payer: Medicare Other | Source: Ambulatory Visit

## 2015-09-04 ENCOUNTER — Ambulatory Visit (INDEPENDENT_AMBULATORY_CARE_PROVIDER_SITE_OTHER): Payer: Medicare Other | Admitting: Endocrinology

## 2015-09-04 VITALS — BP 132/70 | HR 74 | Temp 98.3°F | Wt 246.0 lb

## 2015-09-04 DIAGNOSIS — N390 Urinary tract infection, site not specified: Secondary | ICD-10-CM | POA: Diagnosis not present

## 2015-09-04 DIAGNOSIS — N183 Chronic kidney disease, stage 3 unspecified: Secondary | ICD-10-CM

## 2015-09-04 DIAGNOSIS — R32 Unspecified urinary incontinence: Secondary | ICD-10-CM | POA: Diagnosis not present

## 2015-09-04 DIAGNOSIS — E1122 Type 2 diabetes mellitus with diabetic chronic kidney disease: Secondary | ICD-10-CM

## 2015-09-04 LAB — POCT URINALYSIS DIPSTICK
Bilirubin, UA: NEGATIVE
Blood, UA: NEGATIVE
Glucose, UA: NEGATIVE
Ketones, UA: NEGATIVE
NITRITE UA: NEGATIVE
PH UA: 5
PROTEIN UA: NEGATIVE
Spec Grav, UA: 1.01
UROBILINOGEN UA: 0.2

## 2015-09-04 LAB — POCT GLYCOSYLATED HEMOGLOBIN (HGB A1C): HEMOGLOBIN A1C: 5.9

## 2015-09-04 MED ORDER — CEFUROXIME AXETIL 500 MG PO TABS: 500.0000 mg | ORAL_TABLET | Freq: Two times a day (BID) | ORAL | Status: DC

## 2015-09-04 MED ORDER — SITAGLIPTIN PHOSPHATE 100 MG PO TABS
50.0000 mg | ORAL_TABLET | Freq: Every day | ORAL | Status: DC
Start: 2015-09-04 — End: 2016-03-14

## 2015-09-04 NOTE — Patient Instructions (Addendum)
Please see a urology specialist.  you will receive a phone call, about a day and time for an appointment. i have sent a prescription to your pharmacy, for an antibiotic pill Please come back for a follow-up appointment in 7 months (must be after 03/31/16).

## 2015-09-04 NOTE — Progress Notes (Signed)
Subjective:    Patient ID: Stacy Walls, female    DOB: [birth year], 82 y.o.   MRN: [id number]  HPI Pt returns for f/u of diabetes mellitus: DM type: 2  Dx'ed: Q000111Q Complications: renal failure and CAD Therapy: 2 oral meds GDM: never DKA: never Severe hypoglycemia: never Pancreatitis: never Other: she has never taken insulin.  She is too ill to undergo weight-loss surgery.   Interval history: pt states 1 week of slight pain at the suprapubic tenderness, and assoc dysuria. Past Medical History  Diagnosis Date  . Chest pain   . Clostridium difficile colitis   . Coronary atherosclerosis of native coronary vessel   . Chronic renal insufficiency   . HLD (hyperlipidemia)   . DM2 (diabetes mellitus, type 2) ([place])   . HTN (hypertension)   . Acute cholecystitis   . Dyshidrosis   . Routine general medical examination at a health care facility   . Secondary hyperparathyroidism (of renal origin)   . Hypothyroidism   . Dizziness   . Cough   . Neck pain   . Neck mass   . GERD (gastroesophageal reflux disease)   . Urinary tract infection   . Urinary incontinence   . Gout   . Osteoporosis   . Allergic rhinitis   . Anemia   . Diabetes mellitus     Past Surgical History  Procedure Laterality Date  . C-section (other)  1977  . L ankle surgery  1988  . Electrocardiogram  02/01/06  . Bone density  08/21/05  . Cholecystectomy    . Total left knee  2007    Social History   Social History  . Marital Status: Married    Spouse Name: N/A  . Number of Children: N/A  . Years of Education: N/A   Occupational History  . Not on file.   Social History Main Topics  . Smoking status: Never Smoker   . Smokeless tobacco: Not on file  . Alcohol Use: No  . Drug Use: No  . Sexual Activity: Not on file   Other Topics Concern  . Not on file   Social History Narrative   Lives with husband and has lived in [place] for over 30 years.     Current Outpatient Prescriptions on  File Prior to Visit  Medication Sig Dispense Refill  . allopurinol (ZYLOPRIM) 300 MG tablet TAKE 1 TABLET BY MOUTH EVERY DAY 90 tablet 0  . aspirin (ECOTRIN LOW STRENGTH) 81 MG EC tablet Take 81 mg by mouth daily.      Marland Kitchen atorvastatin (LIPITOR) 80 MG tablet TAKE 1 TABLET BY MOUTH EVERY DAY 90 tablet 0  . epoetin alfa (PROCRIT) 13086 UNIT/ML injection Inject 10,000 Units into the skin every 30 (thirty) days. Dr. Justin Mend    . Ferrous Sulfate (IRON) 325 (65 FE) MG TABS Take 1 tablet by mouth daily.     . furosemide (LASIX) 20 MG tablet TAKE 1 TABLET BY MOUTH EVERY DAY 90 tablet 0  . levothyroxine (SYNTHROID, LEVOTHROID) 50 MCG tablet Take 1 tablet (50 mcg total) by mouth daily before breakfast. 30 tablet 11  . losartan-hydrochlorothiazide (HYZAAR) 100-25 MG tablet TAKE 1 TABLET BY MOUTH DAILY 90 tablet 0  . omeprazole (PRILOSEC) 20 MG capsule TAKE 1 CAPSULE BY MOUTH EVERY DAY 90 capsule 0  . oxybutynin (DITROPAN) 5 MG tablet TAKE 1 TABLET(5 MG) BY MOUTH DAILY 90 tablet 0  . triamcinolone ointment (KENALOG) 0.1 % APPLY TOPICALLY TO THE AFFECTED AREA THREE TIMES  DAILY AS NEEDED FOR ITCHING 454 g 0   No current facility-administered medications on file prior to visit.    Allergies  Allergen Reactions  . Azithromycin   . Pioglitazone     REACTION: edema  . Sulfonamide Derivatives     REACTION: rash  . Tramadol     dizziness    Family History  Problem Relation Age of Onset  . Coronary artery disease Father   . Stroke Father   . Diabetes Father   . Diabetes Sister   . Stroke Sister   . Diabetes Brother     BP 132/70 mmHg  Pulse 74  Temp(Src) 98.3 F (36.8 C) (Oral)  Wt 246 lb (111.585 kg)  SpO2 98%    Review of Systems Denies fever.  She has left ear pain    Objective:   Physical Exam VITAL SIGNS:  See vs page GENERAL: no distress left eac and tm are normal. Pulses: dorsalis pedis intact bilat.   MSK: no deformity of the feet CV: trace bilat leg edema Skin:  no ulcer on  the feet.  normal color and temp on the feet. Neuro: sensation is intact to touch on the feet.   Lab Results  Component Value Date   HGBA1C 5.9 09/04/2015   UA: pos for UTI     Assessment & Plan:  DM: well-controlled.  please continue the same medication UTI, recurrent. Left ear pain, new: with normal exam, no rx is needed  Patient is advised the following: Patient Instructions  Please see a urology specialist.  you will receive a phone call, about a day and time for an appointment. i have sent a prescription to your pharmacy, for an antibiotic pill Please come back for a follow-up appointment in 7 months (must be after 03/31/16).

## 2015-09-08 ENCOUNTER — Other Ambulatory Visit: Payer: Self-pay | Admitting: Endocrinology

## 2015-09-08 LAB — CULTURE, URINE COMPREHENSIVE: Colony Count: 50000

## 2015-09-08 MED ORDER — AMOXICILLIN 500 MG PO CAPS: 500.0000 mg | ORAL_CAPSULE | Freq: Three times a day (TID) | ORAL | Status: DC

## 2015-09-09 ENCOUNTER — Telehealth: Payer: Self-pay | Admitting: Endocrinology

## 2015-09-09 NOTE — Telephone Encounter (Signed)
Patient ask you to return her call, about her medication.

## 2015-09-09 NOTE — Telephone Encounter (Signed)
I contacted the pt and advised based off the urine culture results Dr. Loanne Drilling has switched her antibiotics. Pt voiced understanding.

## 2015-09-17 ENCOUNTER — Telehealth: Payer: Self-pay | Admitting: Endocrinology

## 2015-09-17 NOTE — Telephone Encounter (Signed)
Pt advised of note below. Pt stated she would like to wait a couple of days and see if she starts to feel better before committing to see a specialist. Pt stated she would call us back if she would like to proceed.

## 2015-09-17 NOTE — Telephone Encounter (Signed)
The first antibiotic rx was doing good but the new one is not as good can she start back taking the 1st one she does not know the names

## 2015-09-17 NOTE — Telephone Encounter (Signed)
i reviewed your records about this We are not getting anywhere.   [place] with you to see the specialist?

## 2015-09-17 NOTE — Telephone Encounter (Signed)
See note below and please advise, Thanks! 

## 2015-09-19 ENCOUNTER — Encounter (HOSPITAL_COMMUNITY)
Admission: RE | Admit: 2015-09-19 | Discharge: 2015-09-19 | Disposition: A | Discharge status: Home / Self Care | Payer: Medicare Other | Source: Ambulatory Visit | Attending: Nephrology | Admitting: Nephrology

## 2015-09-19 DIAGNOSIS — D631 Anemia in chronic kidney disease: Secondary | ICD-10-CM | POA: Diagnosis not present

## 2015-09-19 DIAGNOSIS — N183 Chronic kidney disease, stage 3 (moderate): Secondary | ICD-10-CM | POA: Diagnosis not present

## 2015-09-19 LAB — IRON AND TIBC
Iron: 65 ug/dL (ref 28–170)
SATURATION RATIOS: 35 % — AB (ref 10.4–31.8)
TIBC: 188 ug/dL — AB (ref 250–450)
UIBC: 123 ug/dL

## 2015-09-19 LAB — FERRITIN: FERRITIN: 1109 ng/mL — AB (ref 11–307)

## 2015-09-19 LAB — POCT HEMOGLOBIN-HEMACUE: HEMOGLOBIN: 11.1 g/dL — AB (ref 12.0–15.0)

## 2015-09-19 MED ORDER — EPOETIN ALFA 10000 UNIT/ML IJ SOLN
10000.0000 [IU] | INTRAMUSCULAR | Status: DC
  Administered 2015-09-19: 10000 [IU] via SUBCUTANEOUS

## 2015-09-19 MED ORDER — EPOETIN ALFA 10000 UNIT/ML IJ SOLN
INTRAMUSCULAR | Status: AC
  Filled 2015-09-19: qty 1

## 2015-09-27 ENCOUNTER — Other Ambulatory Visit: Payer: Self-pay | Admitting: Endocrinology

## 2015-09-30 ENCOUNTER — Ambulatory Visit: Payer: Medicare Other | Admitting: Endocrinology

## 2015-09-30 ENCOUNTER — Other Ambulatory Visit: Payer: Self-pay

## 2015-09-30 MED ORDER — FUROSEMIDE 20 MG PO TABS: 20.0000 mg | ORAL_TABLET | Freq: Every day | ORAL | Status: DC

## 2015-09-30 MED ORDER — ATORVASTATIN CALCIUM 80 MG PO TABS: 80.0000 mg | ORAL_TABLET | Freq: Every day | ORAL | Status: DC

## 2015-10-17 ENCOUNTER — Encounter (HOSPITAL_COMMUNITY)
Admission: RE | Admit: 2015-10-17 | Discharge: 2015-10-17 | Disposition: A | Discharge status: Home / Self Care | Payer: Medicare Other | Source: Ambulatory Visit | Attending: Nephrology | Admitting: Nephrology

## 2015-10-17 DIAGNOSIS — N183 Chronic kidney disease, stage 3 (moderate): Secondary | ICD-10-CM | POA: Diagnosis not present

## 2015-10-17 DIAGNOSIS — D631 Anemia in chronic kidney disease: Secondary | ICD-10-CM | POA: Diagnosis not present

## 2015-10-17 LAB — IRON AND TIBC
Iron: 67 ug/dL (ref 28–170)
SATURATION RATIOS: 36 % — AB (ref 10.4–31.8)
TIBC: 186 ug/dL — ABNORMAL LOW (ref 250–450)
UIBC: 119 ug/dL

## 2015-10-17 LAB — FERRITIN: Ferritin: 964 ng/mL — ABNORMAL HIGH (ref 11–307)

## 2015-10-17 LAB — POCT HEMOGLOBIN-HEMACUE: Hemoglobin: 11 g/dL — ABNORMAL LOW (ref 12.0–15.0)

## 2015-10-17 MED ORDER — EPOETIN ALFA 10000 UNIT/ML IJ SOLN
10000.0000 [IU] | INTRAMUSCULAR | Status: DC
  Administered 2015-10-17: 10000 [IU] via SUBCUTANEOUS

## 2015-10-17 MED ORDER — EPOETIN ALFA 10000 UNIT/ML IJ SOLN
INTRAMUSCULAR | Status: AC
  Administered 2015-10-17: 10000 [IU] via SUBCUTANEOUS
  Filled 2015-10-17: qty 1

## 2015-11-14 ENCOUNTER — Encounter (HOSPITAL_COMMUNITY)
Admission: RE | Admit: 2015-11-14 | Discharge: 2015-11-14 | Disposition: A | Discharge status: Home / Self Care | Payer: Medicare Other | Source: Ambulatory Visit | Attending: Nephrology | Admitting: Nephrology

## 2015-11-14 DIAGNOSIS — N183 Chronic kidney disease, stage 3 (moderate): Secondary | ICD-10-CM | POA: Insufficient documentation

## 2015-11-14 DIAGNOSIS — D631 Anemia in chronic kidney disease: Secondary | ICD-10-CM | POA: Insufficient documentation

## 2015-11-14 DIAGNOSIS — N189 Chronic kidney disease, unspecified: Secondary | ICD-10-CM

## 2015-11-14 LAB — IRON AND TIBC
Iron: 65 ug/dL (ref 28–170)
SATURATION RATIOS: 34 % — AB (ref 10.4–31.8)
TIBC: 193 ug/dL — AB (ref 250–450)
UIBC: 128 ug/dL

## 2015-11-14 LAB — FERRITIN: FERRITIN: 835 ng/mL — AB (ref 11–307)

## 2015-11-14 MED ORDER — EPOETIN ALFA 10000 UNIT/ML IJ SOLN
10000.0000 [IU] | INTRAMUSCULAR | Status: DC
  Administered 2015-11-14: 10000 [IU] via SUBCUTANEOUS

## 2015-11-14 MED ORDER — EPOETIN ALFA 10000 UNIT/ML IJ SOLN
INTRAMUSCULAR | Status: AC
  Filled 2015-11-14: qty 1

## 2015-11-14 MED ORDER — CLONIDINE HCL 0.1 MG PO TABS: 0.1000 mg | ORAL_TABLET | Freq: Once | ORAL | Status: DC | PRN

## 2015-11-15 LAB — POCT HEMOGLOBIN-HEMACUE: HEMOGLOBIN: 11 g/dL — AB (ref 12.0–15.0)

## 2015-11-20 ENCOUNTER — Other Ambulatory Visit: Payer: Self-pay | Admitting: Endocrinology

## 2015-11-20 DIAGNOSIS — M1711 Unilateral primary osteoarthritis, right knee: Secondary | ICD-10-CM | POA: Diagnosis not present

## 2015-11-20 DIAGNOSIS — Z96652 Presence of left artificial knee joint: Secondary | ICD-10-CM | POA: Diagnosis not present

## 2015-11-20 DIAGNOSIS — R262 Difficulty in walking, not elsewhere classified: Secondary | ICD-10-CM | POA: Diagnosis not present

## 2015-11-20 DIAGNOSIS — M25561 Pain in right knee: Secondary | ICD-10-CM | POA: Diagnosis not present

## 2015-11-25 ENCOUNTER — Other Ambulatory Visit: Payer: Self-pay | Admitting: Endocrinology

## 2015-12-05 ENCOUNTER — Encounter (HOSPITAL_COMMUNITY): Payer: Medicare Other

## 2015-12-12 ENCOUNTER — Encounter (HOSPITAL_COMMUNITY)
Admission: RE | Admit: 2015-12-12 | Discharge: 2015-12-12 | Disposition: A | Discharge status: Home / Self Care | Payer: Medicare Other | Source: Ambulatory Visit | Attending: Nephrology | Admitting: Nephrology

## 2015-12-12 DIAGNOSIS — D631 Anemia in chronic kidney disease: Secondary | ICD-10-CM | POA: Diagnosis not present

## 2015-12-12 DIAGNOSIS — N183 Chronic kidney disease, stage 3 (moderate): Secondary | ICD-10-CM | POA: Insufficient documentation

## 2015-12-12 DIAGNOSIS — N189 Chronic kidney disease, unspecified: Secondary | ICD-10-CM

## 2015-12-12 LAB — IRON AND TIBC
IRON: 53 ug/dL (ref 28–170)
Saturation Ratios: 27 % (ref 10.4–31.8)
TIBC: 196 ug/dL — AB (ref 250–450)
UIBC: 143 ug/dL

## 2015-12-12 LAB — POCT HEMOGLOBIN-HEMACUE: HEMOGLOBIN: 10.5 g/dL — AB (ref 12.0–15.0)

## 2015-12-12 LAB — FERRITIN: Ferritin: 866 ng/mL — ABNORMAL HIGH (ref 11–307)

## 2015-12-12 MED ORDER — EPOETIN ALFA 10000 UNIT/ML IJ SOLN
INTRAMUSCULAR | Status: AC
  Filled 2015-12-12: qty 1

## 2015-12-12 MED ORDER — EPOETIN ALFA 10000 UNIT/ML IJ SOLN
10000.0000 [IU] | INTRAMUSCULAR | Status: DC
  Administered 2015-12-12: 10000 [IU] via SUBCUTANEOUS

## 2015-12-18 ENCOUNTER — Ambulatory Visit (INDEPENDENT_AMBULATORY_CARE_PROVIDER_SITE_OTHER): Payer: Medicare Other | Admitting: Endocrinology

## 2015-12-18 ENCOUNTER — Encounter: Payer: Self-pay | Admitting: Endocrinology

## 2015-12-18 VITALS — BP 118/64 | HR 64 | Wt 244.0 lb

## 2015-12-18 DIAGNOSIS — E1122 Type 2 diabetes mellitus with diabetic chronic kidney disease: Secondary | ICD-10-CM | POA: Diagnosis not present

## 2015-12-18 DIAGNOSIS — N183 Chronic kidney disease, stage 3 (moderate): Secondary | ICD-10-CM

## 2015-12-18 LAB — POCT GLYCOSYLATED HEMOGLOBIN (HGB A1C): Hemoglobin A1C: 6.1

## 2015-12-18 MED ORDER — DICLOFENAC SODIUM 1 % TD GEL: 2.0000 g | Freq: Four times a day (QID) | TRANSDERMAL | 5 refills | Status: DC

## 2015-12-18 MED ORDER — CEFUROXIME AXETIL 250 MG PO TABS: 250.0000 mg | ORAL_TABLET | Freq: Two times a day (BID) | ORAL | 0 refills | Status: AC

## 2015-12-18 NOTE — Progress Notes (Signed)
Subjective:    Patient ID: Stacy Walls, female    DOB: April 14, 1933, 82 y.o.   MRN: [id number]  HPI Pt returns for f/u of diabetes mellitus: DM type: 2  Dx'ed: Q000111Q Complications: renal failure and CAD Therapy: 2 oral meds GDM: never DKA: never Severe hypoglycemia: never Pancreatitis: never Other: she has never taken insulin.  She is too ill to undergo weight-loss surgery.   Interval history: pt states few weeks of congestion in the nose, and assoc pain.  no fever.   Past Medical History:  Diagnosis Date  . Acute cholecystitis   . Allergic rhinitis   . Anemia   . Chest pain   . Chronic renal insufficiency   . Clostridium difficile colitis   . Coronary atherosclerosis of native coronary vessel   . Cough   . Diabetes mellitus   . Dizziness   . DM2 (diabetes mellitus, type 2) ([place])   . Dyshidrosis   . GERD (gastroesophageal reflux disease)   . Gout   . HLD (hyperlipidemia)   . HTN (hypertension)   . Hypothyroidism   . Neck mass   . Neck pain   . Osteoporosis   . Routine general medical examination at a health care facility   . Secondary hyperparathyroidism (of renal origin)   . Urinary incontinence   . Urinary tract infection     Past Surgical History:  Procedure Laterality Date  . bone density  08/21/05  . C-section (other)  1977  . CHOLECYSTECTOMY    . ELECTROCARDIOGRAM  02/01/06  . L ankle surgery  1988  . total left knee  2007    Social History   Social History  . Marital status: Married    Spouse name: N/A  . Number of children: N/A  . Years of education: N/A   Occupational History  . Not on file.   Social History Main Topics  . Smoking status: Never Smoker  . Smokeless tobacco: Not on file  . Alcohol use No  . Drug use: No  . Sexual activity: Not on file   Other Topics Concern  . Not on file   Social History Narrative   Lives with husband and has lived in [place] for over 30 years.     Current Outpatient Prescriptions on File  Prior to Visit  Medication Sig Dispense Refill  . allopurinol (ZYLOPRIM) 300 MG tablet TAKE 1 TABLET BY MOUTH EVERY DAY 90 tablet 0  . aspirin (ECOTRIN LOW STRENGTH) 81 MG EC tablet Take 81 mg by mouth daily.      Marland Kitchen atorvastatin (LIPITOR) 80 MG tablet TAKE 1 TABLET BY MOUTH EVERY DAY 90 tablet 0  . epoetin alfa (PROCRIT) 16109 UNIT/ML injection Inject 10,000 Units into the skin every 30 (thirty) days. Dr. Justin Mend    . Ferrous Sulfate (IRON) 325 (65 FE) MG TABS Take 1 tablet by mouth daily.     . furosemide (LASIX) 20 MG tablet Take 1 tablet (20 mg total) by mouth daily. 90 tablet 0  . levothyroxine (SYNTHROID, LEVOTHROID) 50 MCG tablet Take 1 tablet (50 mcg total) by mouth daily before breakfast. 30 tablet 11  . losartan-hydrochlorothiazide (HYZAAR) 100-25 MG tablet TAKE 1 TABLET BY MOUTH DAILY 90 tablet 0  . omeprazole (PRILOSEC) 20 MG capsule TAKE 1 CAPSULE BY MOUTH EVERY DAY 90 capsule 0  . oxybutynin (DITROPAN) 5 MG tablet TAKE 1 TABLET(5 MG) BY MOUTH DAILY 90 tablet 0  . sitaGLIPtin (JANUVIA) 100 MG tablet Take 0.5 tablets (50  mg total) by mouth daily. 15 tablet 5  . triamcinolone ointment (KENALOG) 0.1 % APPLY TOPICALLY TO THE AFFECTED AREA THREE TIMES DAILY AS NEEDED FOR ITCHING 454 g 0   No current facility-administered medications on file prior to visit.     Allergies  Allergen Reactions  . Azithromycin   . Pioglitazone     REACTION: edema  . Sulfonamide Derivatives     REACTION: rash  . Tramadol     dizziness    Family History  Problem Relation Age of Onset  . Coronary artery disease Father   . Stroke Father   . Diabetes Father   . Diabetes Sister   . Stroke Sister   . Diabetes Brother     BP 118/64   Pulse 64   Wt 244 lb (110.7 kg)   BMI 43.22 kg/m    Review of Systems She has right shoulder pain, but no numbness.      Objective:   Physical Exam VITAL SIGNS:  See vs page GENERAL: no distress head: no deformity  eyes: no periorbital swelling, no proptosis.    external nose and ears are normal  mouth: no lesion seen Both eac's and tm's are normal LUNGS:  Clear to auscultation Right shoulder: full ROM, but ROM is painful.   A1c=6.1% Lab Results  Component Value Date   CREATININE 1.70 (H) 04/01/2015   BUN 26 (H) 04/01/2015   NA 137 04/01/2015   K 3.6 04/01/2015   CL 101 04/01/2015   CO2 29 04/01/2015      Assessment & Plan:  URI: new Shoulder pain, new: she declines x-ray.  Type 2 DM: well-controlled

## 2015-12-18 NOTE — Patient Instructions (Addendum)
I have sent 2 prescriptions to your pharmacy: for an antibiotic pill, and anti-pain gel. Please continue the same medications, for the diabetes.   Loratadine-d (non-prescription) will help your congestion.   Please come back for a follow-up appointment in 7 months (must be after 03/31/16).

## 2015-12-30 ENCOUNTER — Other Ambulatory Visit: Payer: Self-pay | Admitting: Endocrinology

## 2016-01-06 DIAGNOSIS — M1711 Unilateral primary osteoarthritis, right knee: Secondary | ICD-10-CM | POA: Diagnosis not present

## 2016-01-07 DIAGNOSIS — E669 Obesity, unspecified: Secondary | ICD-10-CM | POA: Diagnosis not present

## 2016-01-07 DIAGNOSIS — N183 Chronic kidney disease, stage 3 (moderate): Secondary | ICD-10-CM | POA: Diagnosis not present

## 2016-01-07 DIAGNOSIS — D631 Anemia in chronic kidney disease: Secondary | ICD-10-CM | POA: Diagnosis not present

## 2016-01-07 DIAGNOSIS — N2581 Secondary hyperparathyroidism of renal origin: Secondary | ICD-10-CM | POA: Diagnosis not present

## 2016-01-09 ENCOUNTER — Encounter (HOSPITAL_COMMUNITY)
Admission: RE | Admit: 2016-01-09 | Discharge: 2016-01-09 | Disposition: A | Discharge status: Home / Self Care | Payer: Medicare Other | Source: Ambulatory Visit | Attending: Nephrology | Admitting: Nephrology

## 2016-01-09 DIAGNOSIS — D631 Anemia in chronic kidney disease: Secondary | ICD-10-CM | POA: Diagnosis not present

## 2016-01-09 DIAGNOSIS — N183 Chronic kidney disease, stage 3 (moderate): Secondary | ICD-10-CM | POA: Diagnosis not present

## 2016-01-09 DIAGNOSIS — N189 Chronic kidney disease, unspecified: Secondary | ICD-10-CM

## 2016-01-09 LAB — IRON AND TIBC
Iron: 102 ug/dL (ref 28–170)
SATURATION RATIOS: 51 % — AB (ref 10.4–31.8)
TIBC: 200 ug/dL — ABNORMAL LOW (ref 250–450)
UIBC: 98 ug/dL

## 2016-01-09 LAB — FERRITIN: FERRITIN: 807 ng/mL — AB (ref 11–307)

## 2016-01-09 LAB — POCT HEMOGLOBIN-HEMACUE: HEMOGLOBIN: 11.6 g/dL — AB (ref 12.0–15.0)

## 2016-01-09 MED ORDER — EPOETIN ALFA 10000 UNIT/ML IJ SOLN
10000.0000 [IU] | INTRAMUSCULAR | Status: DC
  Administered 2016-01-09: 10000 [IU] via SUBCUTANEOUS

## 2016-01-09 MED ORDER — EPOETIN ALFA 10000 UNIT/ML IJ SOLN
INTRAMUSCULAR | Status: AC
  Administered 2016-01-09: 10000 [IU] via SUBCUTANEOUS
  Filled 2016-01-09: qty 1

## 2016-01-15 ENCOUNTER — Ambulatory Visit (INDEPENDENT_AMBULATORY_CARE_PROVIDER_SITE_OTHER): Payer: Medicare Other | Admitting: Endocrinology

## 2016-01-15 VITALS — BP 124/72 | HR 69 | Ht 63.0 in | Wt 245.0 lb

## 2016-01-15 DIAGNOSIS — Z23 Encounter for immunization: Secondary | ICD-10-CM

## 2016-01-15 MED ORDER — CEFUROXIME AXETIL 250 MG PO TABS: 250.0000 mg | ORAL_TABLET | Freq: Two times a day (BID) | ORAL | 0 refills | Status: AC

## 2016-01-15 NOTE — Progress Notes (Signed)
Subjective:    Patient ID: Stacy Walls, female    DOB: [birth year], 82 y.o.   MRN: [id number]  HPI Pt states few days of slight pain at the throat, and assoc nasal congestion.  Past Medical History:  Diagnosis Date  . Acute cholecystitis   . Allergic rhinitis   . Anemia   . Chest pain   . Chronic renal insufficiency   . Clostridium difficile colitis   . Coronary atherosclerosis of native coronary vessel   . Cough   . Diabetes mellitus   . Dizziness   . DM2 (diabetes mellitus, type 2) ([place])   . Dyshidrosis   . GERD (gastroesophageal reflux disease)   . Gout   . HLD (hyperlipidemia)   . HTN (hypertension)   . Hypothyroidism   . Neck mass   . Neck pain   . Osteoporosis   . Routine general medical examination at a health care facility   . Secondary hyperparathyroidism (of renal origin)   . Urinary incontinence   . Urinary tract infection     Past Surgical History:  Procedure Laterality Date  . bone density  08/21/05  . C-section (other)  1977  . CHOLECYSTECTOMY    . ELECTROCARDIOGRAM  02/01/06  . L ankle surgery  1988  . total left knee  2007    Social History   Social History  . Marital status: Married    Spouse name: N/A  . Number of children: N/A  . Years of education: N/A   Occupational History  . Not on file.   Social History Main Topics  . Smoking status: Never Smoker  . Smokeless tobacco: Not on file  . Alcohol use No  . Drug use: No  . Sexual activity: Not on file   Other Topics Concern  . Not on file   Social History Narrative   Lives with husband and has lived in [place] for over 30 years.     Current Outpatient Prescriptions on File Prior to Visit  Medication Sig Dispense Refill  . allopurinol (ZYLOPRIM) 300 MG tablet TAKE 1 TABLET BY MOUTH EVERY DAY 90 tablet 0  . aspirin (ECOTRIN LOW STRENGTH) 81 MG EC tablet Take 81 mg by mouth daily.      Marland Kitchen atorvastatin (LIPITOR) 80 MG tablet TAKE 1 TABLET BY MOUTH EVERY DAY 90 tablet 0  .  diclofenac sodium (VOLTAREN) 1 % GEL Apply 2 g topically 4 (four) times daily. 100 g 5  . epoetin alfa (PROCRIT) 14481 UNIT/ML injection Inject 10,000 Units into the skin every 30 (thirty) days. Dr. Justin Mend    . Ferrous Sulfate (IRON) 325 (65 FE) MG TABS Take 1 tablet by mouth daily.     . furosemide (LASIX) 20 MG tablet Take 1 tablet (20 mg total) by mouth daily. 90 tablet 0  . levothyroxine (SYNTHROID, LEVOTHROID) 50 MCG tablet Take 1 tablet (50 mcg total) by mouth daily before breakfast. 30 tablet 11  . losartan-hydrochlorothiazide (HYZAAR) 100-25 MG tablet TAKE 1 TABLET BY MOUTH DAILY 90 tablet 0  . omeprazole (PRILOSEC) 20 MG capsule TAKE 1 CAPSULE BY MOUTH EVERY DAY 90 capsule 0  . oxybutynin (DITROPAN) 5 MG tablet TAKE 1 TABLET(5 MG) BY MOUTH DAILY 90 tablet 0  . sitaGLIPtin (JANUVIA) 100 MG tablet Take 0.5 tablets (50 mg total) by mouth daily. 15 tablet 5  . triamcinolone ointment (KENALOG) 0.1 % APPLY TOPICALLY TO THE AFFECTED AREA THREE TIMES DAILY AS NEEDED FOR ITCHING 454 g 0  No current facility-administered medications on file prior to visit.     Allergies  Allergen Reactions  . Azithromycin   . Pioglitazone     REACTION: edema  . Sulfonamide Derivatives     REACTION: rash  . Tramadol     dizziness    Family History  Problem Relation Age of Onset  . Coronary artery disease Father   . Stroke Father   . Diabetes Father   . Diabetes Sister   . Stroke Sister   . Diabetes Brother     BP 124/72   Pulse 69   Ht 5\' 3"  (1.6 m)   Wt 245 lb (111.1 kg)   SpO2 97%   BMI 43.40 kg/m    Review of Systems She has a slight prod-quality cough.  Denies fever.     Objective:   Physical Exam VITAL SIGNS:  See vs page.  GENERAL: no distress.  head: no deformity.  eyes: no periorbital swelling, no proptosis  external nose and ears are normal.   mouth: no lesion seen.  Both eac's and tm's are normal.  LUNGS:  Clear to auscultation.        Assessment & Plan:  URI:  new

## 2016-01-15 NOTE — Patient Instructions (Addendum)
I have sent a prescription to your pharmacy, for an antibiotic pill. Loratadine-d (non-prescription) will help your congestion. I hope you feel better soon.  If you don't feel better by next week, please call back.  Please call sooner if you get worse.  Please come back for a follow-up appointment in 3 months (must be after 03/31/16).

## 2016-02-03 DIAGNOSIS — M1711 Unilateral primary osteoarthritis, right knee: Secondary | ICD-10-CM | POA: Diagnosis not present

## 2016-02-06 ENCOUNTER — Encounter (HOSPITAL_COMMUNITY)
Admission: RE | Admit: 2016-02-06 | Discharge: 2016-02-06 | Disposition: A | Discharge status: Home / Self Care | Payer: Medicare Other | Source: Ambulatory Visit | Attending: Nephrology | Admitting: Nephrology

## 2016-02-06 DIAGNOSIS — D631 Anemia in chronic kidney disease: Secondary | ICD-10-CM | POA: Insufficient documentation

## 2016-02-06 DIAGNOSIS — N183 Chronic kidney disease, stage 3 (moderate): Secondary | ICD-10-CM | POA: Diagnosis not present

## 2016-02-06 LAB — IRON AND TIBC
Iron: 74 ug/dL (ref 28–170)
SATURATION RATIOS: 35 % — AB (ref 10.4–31.8)
TIBC: 209 ug/dL — AB (ref 250–450)
UIBC: 135 ug/dL

## 2016-02-06 LAB — POCT HEMOGLOBIN-HEMACUE: HEMOGLOBIN: 10.9 g/dL — AB (ref 12.0–15.0)

## 2016-02-06 LAB — FERRITIN: Ferritin: 909 ng/mL — ABNORMAL HIGH (ref 11–307)

## 2016-02-06 MED ORDER — EPOETIN ALFA 10000 UNIT/ML IJ SOLN
10000.0000 [IU] | INTRAMUSCULAR | Status: DC
  Administered 2016-02-06: 10000 [IU] via SUBCUTANEOUS

## 2016-02-06 MED ORDER — EPOETIN ALFA 10000 UNIT/ML IJ SOLN
INTRAMUSCULAR | Status: AC
  Filled 2016-02-06: qty 1

## 2016-02-16 ENCOUNTER — Other Ambulatory Visit: Payer: Self-pay | Admitting: Endocrinology

## 2016-02-23 ENCOUNTER — Other Ambulatory Visit: Payer: Self-pay | Admitting: Endocrinology

## 2016-02-24 ENCOUNTER — Other Ambulatory Visit: Payer: Self-pay | Admitting: Endocrinology

## 2016-03-04 ENCOUNTER — Other Ambulatory Visit (HOSPITAL_COMMUNITY): Payer: Self-pay | Admitting: *Deleted

## 2016-03-05 ENCOUNTER — Encounter (HOSPITAL_COMMUNITY)
Admission: RE | Admit: 2016-03-05 | Discharge: 2016-03-05 | Disposition: A | Discharge status: Home / Self Care | Payer: Medicare Other | Source: Ambulatory Visit | Attending: Nephrology | Admitting: Nephrology

## 2016-03-05 DIAGNOSIS — N183 Chronic kidney disease, stage 3 unspecified: Secondary | ICD-10-CM

## 2016-03-05 DIAGNOSIS — D631 Anemia in chronic kidney disease: Secondary | ICD-10-CM | POA: Insufficient documentation

## 2016-03-05 LAB — IRON AND TIBC
IRON: 67 ug/dL (ref 28–170)
Saturation Ratios: 33 % — ABNORMAL HIGH (ref 10.4–31.8)
TIBC: 206 ug/dL — ABNORMAL LOW (ref 250–450)
UIBC: 139 ug/dL

## 2016-03-05 LAB — FERRITIN: Ferritin: 787 ng/mL — ABNORMAL HIGH (ref 11–307)

## 2016-03-05 LAB — POCT HEMOGLOBIN-HEMACUE: Hemoglobin: 11.1 g/dL — ABNORMAL LOW (ref 12.0–15.0)

## 2016-03-05 MED ORDER — EPOETIN ALFA 10000 UNIT/ML IJ SOLN
INTRAMUSCULAR | Status: AC
  Filled 2016-03-05: qty 1

## 2016-03-05 MED ORDER — EPOETIN ALFA 10000 UNIT/ML IJ SOLN
10000.0000 [IU] | INTRAMUSCULAR | Status: DC
  Administered 2016-03-05: 10000 [IU] via SUBCUTANEOUS

## 2016-03-06 ENCOUNTER — Ambulatory Visit: Payer: Medicare Other | Admitting: Endocrinology

## 2016-03-14 ENCOUNTER — Other Ambulatory Visit: Payer: Self-pay | Admitting: Endocrinology

## 2016-03-31 ENCOUNTER — Ambulatory Visit: Payer: Medicare Other | Admitting: Endocrinology

## 2016-04-01 ENCOUNTER — Encounter (HOSPITAL_COMMUNITY)
Admission: RE | Admit: 2016-04-01 | Discharge: 2016-04-01 | Disposition: A | Discharge status: Home / Self Care | Payer: Medicare Other | Source: Ambulatory Visit | Attending: Nephrology | Admitting: Nephrology

## 2016-04-01 DIAGNOSIS — N183 Chronic kidney disease, stage 3 unspecified: Secondary | ICD-10-CM

## 2016-04-01 DIAGNOSIS — D631 Anemia in chronic kidney disease: Secondary | ICD-10-CM | POA: Diagnosis not present

## 2016-04-01 LAB — FERRITIN: FERRITIN: 863 ng/mL — AB (ref 11–307)

## 2016-04-01 LAB — IRON AND TIBC
IRON: 54 ug/dL (ref 28–170)
SATURATION RATIOS: 27 % (ref 10.4–31.8)
TIBC: 202 ug/dL — ABNORMAL LOW (ref 250–450)
UIBC: 148 ug/dL

## 2016-04-01 LAB — POCT HEMOGLOBIN-HEMACUE: Hemoglobin: 11.4 g/dL — ABNORMAL LOW (ref 12.0–15.0)

## 2016-04-01 MED ORDER — EPOETIN ALFA 10000 UNIT/ML IJ SOLN
INTRAMUSCULAR | Status: AC
  Administered 2016-04-01: 10000 [IU] via SUBCUTANEOUS
  Filled 2016-04-01: qty 1

## 2016-04-01 MED ORDER — EPOETIN ALFA 10000 UNIT/ML IJ SOLN
10000.0000 [IU] | INTRAMUSCULAR | Status: DC
  Administered 2016-04-01: 10000 [IU] via SUBCUTANEOUS

## 2016-04-02 ENCOUNTER — Encounter (HOSPITAL_COMMUNITY): Payer: Medicare Other

## 2016-04-05 ENCOUNTER — Other Ambulatory Visit: Payer: Self-pay | Admitting: Endocrinology

## 2016-04-14 ENCOUNTER — Other Ambulatory Visit: Payer: Self-pay | Admitting: Endocrinology

## 2016-04-24 ENCOUNTER — Ambulatory Visit (INDEPENDENT_AMBULATORY_CARE_PROVIDER_SITE_OTHER): Payer: Medicare Other | Admitting: Endocrinology

## 2016-04-24 ENCOUNTER — Encounter: Payer: Self-pay | Admitting: Endocrinology

## 2016-04-24 ENCOUNTER — Telehealth: Payer: Self-pay | Admitting: Family Medicine

## 2016-04-24 VITALS — BP 122/64 | HR 84 | Ht 63.0 in | Wt 244.0 lb

## 2016-04-24 DIAGNOSIS — Z23 Encounter for immunization: Secondary | ICD-10-CM

## 2016-04-24 DIAGNOSIS — E1122 Type 2 diabetes mellitus with diabetic chronic kidney disease: Secondary | ICD-10-CM

## 2016-04-24 DIAGNOSIS — H6690 Otitis media, unspecified, unspecified ear: Secondary | ICD-10-CM

## 2016-04-24 DIAGNOSIS — Z0000 Encounter for general adult medical examination without abnormal findings: Secondary | ICD-10-CM

## 2016-04-24 DIAGNOSIS — N183 Chronic kidney disease, stage 3 (moderate): Secondary | ICD-10-CM

## 2016-04-24 LAB — POCT GLYCOSYLATED HEMOGLOBIN (HGB A1C): Hemoglobin A1C: 6.3

## 2016-04-24 MED ORDER — ATORVASTATIN CALCIUM 80 MG PO TABS: 80.0000 mg | ORAL_TABLET | Freq: Every day | ORAL | 3 refills | Status: DC

## 2016-04-24 MED ORDER — CEPHALEXIN 250 MG PO TABS: 250.0000 mg | ORAL_TABLET | Freq: Three times a day (TID) | ORAL | 0 refills | Status: DC

## 2016-04-24 MED ORDER — OMEPRAZOLE 20 MG PO CPDR: 20.0000 mg | DELAYED_RELEASE_CAPSULE | Freq: Every day | ORAL | 3 refills | Status: DC

## 2016-04-24 NOTE — Patient Instructions (Addendum)
I have sent a prescription to your pharmacy, for an antibiotic pill. Please continue the same [place].   Please consider these measures for your health:  minimize alcohol.  Do not use tobacco products.  Have a colonoscopy at least every 10 years from age 81.  Women should have an annual mammogram from age 29.  Keep firearms safely stored.  Always use seat belts.  have working smoke alarms in your home.  See an eye doctor and dentist regularly.  Never drive under the influence of alcohol or drugs (including prescription drugs).   It is critically important to prevent falling down (keep floor areas well-lit, dry, and free of loose objects.  If you have a cane, walker, or wheelchair, you should use it, even for short trips around the house.  Wear flat-soled shoes.  Also, try not to rush).  good diet and exercise significantly improve the control of your diabetes.  please let me know if you wish to be referred to a dietician.  high blood sugar is very risky to your health.  you should see an eye doctor and dentist every year.  It is very important to get all recommended vaccinations.  Please come back for a follow-up appointment in 6 months.

## 2016-04-24 NOTE — Progress Notes (Signed)
we discussed code status.  pt requests full code, but would not want to be started or maintained on artificial life-support measures if there was not a reasonable chance of recovery 

## 2016-04-24 NOTE — Progress Notes (Signed)
Subjective:    Patient ID: Stacy Walls, female    DOB: 1932/09/05, 83 y.o.   MRN: [id number]  HPI Pt returns for f/u of diabetes mellitus: DM type: 2  Dx'ed: 3419 Complications: renal failure and CAD Therapy: januvia.   GDM: never DKA: never Severe hypoglycemia: never Pancreatitis: never Other: she has never taken insulin.  She is too ill to undergo weight-loss surgery.   Interval history: pt reports few days of slight left ear pain, but no assoc drainage.  Past Medical History:  Diagnosis Date  . Acute cholecystitis   . Allergic rhinitis   . Anemia   . Chest pain   . Chronic renal insufficiency   . Clostridium difficile colitis   . Coronary atherosclerosis of native coronary vessel   . Cough   . Diabetes mellitus   . Dizziness   . DM2 (diabetes mellitus, type 2) ([place])   . Dyshidrosis   . GERD (gastroesophageal reflux disease)   . Gout   . HLD (hyperlipidemia)   . HTN (hypertension)   . Hypothyroidism   . Neck mass   . Neck pain   . Osteoporosis   . Routine general medical examination at a health care facility   . Secondary hyperparathyroidism (of renal origin)   . Urinary incontinence   . Urinary tract infection     Past Surgical History:  Procedure Laterality Date  . bone density  08/21/05  . C-section (other)  1977  . CHOLECYSTECTOMY    . ELECTROCARDIOGRAM  02/01/06  . L ankle surgery  1988  . total left knee  2007    Social History   Social History  . Marital status: Married    Spouse name: N/A  . Number of children: N/A  . Years of education: N/A   Occupational History  . Not on file.   Social History Main Topics  . Smoking status: Never Smoker  . Smokeless tobacco: Not on file  . Alcohol use No  . Drug use: No  . Sexual activity: Not on file   Other Topics Concern  . Not on file   Social History Narrative   Lives with husband and has lived in [place] for over 30 years.     Current Outpatient Prescriptions on File Prior to  Visit  Medication Sig Dispense Refill  . allopurinol (ZYLOPRIM) 300 MG tablet TAKE 1 TABLET BY MOUTH EVERY DAY 90 tablet 0  . aspirin (ECOTRIN LOW STRENGTH) 81 MG EC tablet Take 81 mg by mouth daily.      . diclofenac sodium (VOLTAREN) 1 % GEL Apply 2 g topically 4 (four) times daily. 100 g 5  . epoetin alfa (PROCRIT) 37902 UNIT/ML injection Inject 10,000 Units into the skin every 30 (thirty) days. Dr. Justin Mend    . Ferrous Sulfate (IRON) 325 (65 FE) MG TABS Take 1 tablet by mouth daily.     . furosemide (LASIX) 20 MG tablet TAKE 1 TABLET(20 MG) BY MOUTH DAILY 90 tablet 0  . JANUVIA 100 MG tablet TAKE 1/2 TABLET(50 MG) BY MOUTH DAILY 15 tablet 0  . levothyroxine (SYNTHROID, LEVOTHROID) 50 MCG tablet TAKE 1 TABLET BY MOUTH DAILY BEFORE BREAKFAST 30 tablet 0  . losartan-hydrochlorothiazide (HYZAAR) 100-25 MG tablet TAKE 1 TABLET BY MOUTH DAILY 90 tablet 0  . oxybutynin (DITROPAN) 5 MG tablet TAKE 1 TABLET(5 MG) BY MOUTH DAILY 90 tablet 0  . triamcinolone ointment (KENALOG) 0.1 % APPLY TOPICALLY TO THE AFFECTED AREA THREE TIMES DAILY AS NEEDED  FOR ITCHING 454 g 0   No current facility-administered medications on file prior to visit.     Allergies  Allergen Reactions  . Azithromycin   . Pioglitazone     REACTION: edema  . Sulfonamide Derivatives     REACTION: rash  . Tramadol     dizziness    Family History  Problem Relation Age of Onset  . Coronary artery disease Father   . Stroke Father   . Diabetes Father   . Diabetes Sister   . Stroke Sister   . Diabetes Brother     BP 122/64   Pulse 84   Ht 5\' 3"  (1.6 m)   Wt 244 lb (110.7 kg)   SpO2 96%   BMI 43.22 kg/m    Review of Systems She denies hypoglycemia and fever.      Objective:   Physical Exam VITAL SIGNS:  See vs page GENERAL: no distress EARS: left tm is slightly red (right is normal).   Pulses: dorsalis pedis intact bilat.   MSK: no deformity of the feet.  CV: trace bilat leg edema Skin:  no ulcer on the feet.   normal color and temp on the feet. Neuro: sensation is intact to touch on the feet.  Ext: There is bilateral onychomycosis of the toenails  Lab Results  Component Value Date   HGBA1C 6.3 04/24/2016      Assessment & Plan:  AOM, new: I have sent a prescription to your pharmacy, for an antibiotic pill. Type 2 DM: well-controlled.  Please continue the same [place]  Subjective:   Patient here for Medicare annual wellness visit and management of other chronic and acute problems.     Risk factors: advanced age    25 of Physicians Providing Medical Care to Patient:  See "snapshot"   Activities of Daily Living: In your present state of health, do you have any difficulty performing the following activities (lives with husband)?:  Preparing food and eating?: No  Bathing yourself: No  Getting dressed: No  Using the toilet:No  Moving around from place to place: No  In the past year have you fallen or had a near fall?: No    Home Safety: Has smoke detector and wears seat belts. No firearms.   Diet and Exercise  Current exercise habits: limited by health probs--uses cane Dietary issues discussed: pt reports a healthy diet   Depression Screen  Q1: Over the past two weeks, have you felt down, depressed or hopeless? no  Q2: Over the past two weeks, have you felt little interest or pleasure in doing things? No.   The following portions of the patient's history were reviewed and updated as appropriate: allergies, current medications, past family history, past medical history, past social history, past surgical history and problem list.   Review of Systems  No change in chronic hearing loss (declines ref audiol); denies visual loss.  Objective:   Vision:  [employer], but does not recall name.  She declines [facility] today.  Hearing: grossly normal.   Body mass index:  See vs page Msk: pt slowly performs "get-up-and-go" from a sitting position.   Cognitive Impairment Assessment:  cognition, memory and judgment appear normal.  remembers 3/3 at 5 minutes.  excellent recall.  can easily read and write a sentence.  alert and oriented x 3.     Assessment:   Medicare wellness utd on preventive parameters.    Plan:   During the course of the visit the patient  was educated and counseled about appropriate screening and preventive services including:        Fall prevention   Screening mammography  Bone densitometry screening  Diabetes screening  Nutrition counseling   Vaccines / LABS Pneumococcal Vaccine is given today.    Patient Instructions (the written plan) was given to the patient.

## 2016-04-24 NOTE — Telephone Encounter (Addendum)
7: 38 pm Ms Stacy Walls is calling to inform that Ms Stacy Walls was at her pharmacy and Rx she was supposed to pick up was not there.  At the time of Ms Stacy Walls's call I did not have access to EPIC.  Reviewing records: Ms Stacy Walls saw Dr Stacy Walls today and Cephalexin 250 mg TID was prescribed.  Rx was re-send as prescribed to her local pharmacy # 21/0. Ms Stacy Walls is aware that Rx was re-sent, she has no further questions and will pick up Rx now.   Stacy Martinique, MD

## 2016-04-25 ENCOUNTER — Other Ambulatory Visit: Payer: Self-pay | Admitting: Endocrinology

## 2016-04-27 ENCOUNTER — Telehealth: Payer: Self-pay

## 2016-04-27 ENCOUNTER — Encounter (HOSPITAL_COMMUNITY): Payer: Medicare Other

## 2016-04-27 ENCOUNTER — Telehealth: Payer: Self-pay | Admitting: Endocrinology

## 2016-04-27 NOTE — Telephone Encounter (Signed)
Cephalexin 250 was submitted by Dr. Betty Martinique who was on call 03/24/2017.

## 2016-04-27 NOTE — Telephone Encounter (Signed)
Received PA request from [facility] for Cipro 250 mg tablets. PA submitted & is pending. Key: [id number]

## 2016-04-27 NOTE — Telephone Encounter (Signed)
[facility] Call: Caller states her medication is not at pharmacy. States she has ear infection.

## 2016-04-28 NOTE — Telephone Encounter (Signed)
PA approved, form faxed back to pharmacy. 

## 2016-04-30 ENCOUNTER — Encounter (HOSPITAL_COMMUNITY)
Admission: RE | Admit: 2016-04-30 | Discharge: 2016-04-30 | Disposition: A | Discharge status: Home / Self Care | Payer: Medicare Other | Source: Ambulatory Visit | Attending: Nephrology | Admitting: Nephrology

## 2016-04-30 DIAGNOSIS — N183 Chronic kidney disease, stage 3 unspecified: Secondary | ICD-10-CM

## 2016-04-30 DIAGNOSIS — D631 Anemia in chronic kidney disease: Secondary | ICD-10-CM | POA: Diagnosis not present

## 2016-04-30 LAB — IRON AND TIBC
Iron: 61 ug/dL (ref 28–170)
Saturation Ratios: 31 % (ref 10.4–31.8)
TIBC: 199 ug/dL — ABNORMAL LOW (ref 250–450)
UIBC: 138 ug/dL

## 2016-04-30 LAB — POCT HEMOGLOBIN-HEMACUE: HEMOGLOBIN: 10.9 g/dL — AB (ref 12.0–15.0)

## 2016-04-30 LAB — FERRITIN: FERRITIN: 1045 ng/mL — AB (ref 11–307)

## 2016-04-30 MED ORDER — EPOETIN ALFA 10000 UNIT/ML IJ SOLN
10000.0000 [IU] | INTRAMUSCULAR | Status: DC
  Administered 2016-04-30: 13:00:00 10000 [IU] via SUBCUTANEOUS

## 2016-04-30 MED ORDER — EPOETIN ALFA 10000 UNIT/ML IJ SOLN
INTRAMUSCULAR | Status: AC
  Filled 2016-04-30: qty 1

## 2016-05-01 ENCOUNTER — Telehealth: Payer: Self-pay | Admitting: Endocrinology

## 2016-05-01 ENCOUNTER — Other Ambulatory Visit: Payer: Self-pay | Admitting: Endocrinology

## 2016-05-01 MED ORDER — OMEPRAZOLE 20 MG PO CPDR: 20.0000 mg | DELAYED_RELEASE_CAPSULE | Freq: Every day | ORAL | 3 refills | Status: DC

## 2016-05-01 NOTE — Telephone Encounter (Signed)
Refill submitted. 

## 2016-05-01 NOTE — Telephone Encounter (Signed)
Pt needs her Omeprazole sent into the [name] on [address] and [address].

## 2016-05-14 ENCOUNTER — Other Ambulatory Visit: Payer: Self-pay | Admitting: Endocrinology

## 2016-05-22 ENCOUNTER — Other Ambulatory Visit: Payer: Self-pay | Admitting: Endocrinology

## 2016-05-24 ENCOUNTER — Other Ambulatory Visit: Payer: Self-pay | Admitting: Endocrinology

## 2016-05-27 ENCOUNTER — Other Ambulatory Visit (HOSPITAL_COMMUNITY): Payer: Self-pay | Admitting: *Deleted

## 2016-05-28 ENCOUNTER — Encounter (HOSPITAL_COMMUNITY)
Admission: RE | Admit: 2016-05-28 | Discharge: 2016-05-28 | Disposition: A | Discharge status: Home / Self Care | Payer: Medicare Other | Source: Ambulatory Visit | Attending: Nephrology | Admitting: Nephrology

## 2016-05-28 ENCOUNTER — Encounter (HOSPITAL_COMMUNITY): Payer: Self-pay

## 2016-05-28 DIAGNOSIS — D631 Anemia in chronic kidney disease: Secondary | ICD-10-CM | POA: Insufficient documentation

## 2016-05-28 DIAGNOSIS — N183 Chronic kidney disease, stage 3 unspecified: Secondary | ICD-10-CM

## 2016-05-28 LAB — IRON AND TIBC
IRON: 49 ug/dL (ref 28–170)
SATURATION RATIOS: 26 % (ref 10.4–31.8)
TIBC: 189 ug/dL — AB (ref 250–450)
UIBC: 140 ug/dL

## 2016-05-28 LAB — POCT HEMOGLOBIN-HEMACUE: Hemoglobin: 11.3 g/dL — ABNORMAL LOW (ref 12.0–15.0)

## 2016-05-28 LAB — FERRITIN: FERRITIN: 815 ng/mL — AB (ref 11–307)

## 2016-05-28 MED ORDER — EPOETIN ALFA 10000 UNIT/ML IJ SOLN
INTRAMUSCULAR | Status: AC
  Administered 2016-05-28: 10000 [IU] via SUBCUTANEOUS
  Filled 2016-05-28: qty 1

## 2016-05-28 MED ORDER — EPOETIN ALFA 10000 UNIT/ML IJ SOLN: 10000.0000 [IU] | INTRAMUSCULAR | Status: DC

## 2016-06-15 ENCOUNTER — Other Ambulatory Visit: Payer: Self-pay | Admitting: Endocrinology

## 2016-06-25 ENCOUNTER — Encounter (HOSPITAL_COMMUNITY)
Admission: RE | Admit: 2016-06-25 | Discharge: 2016-06-25 | Disposition: A | Discharge status: Home / Self Care | Payer: Medicare Other | Source: Ambulatory Visit | Attending: Nephrology | Admitting: Nephrology

## 2016-06-25 DIAGNOSIS — N183 Chronic kidney disease, stage 3 unspecified: Secondary | ICD-10-CM

## 2016-06-25 DIAGNOSIS — D631 Anemia in chronic kidney disease: Secondary | ICD-10-CM | POA: Diagnosis not present

## 2016-06-25 LAB — IRON AND TIBC
IRON: 65 ug/dL (ref 28–170)
Saturation Ratios: 33 % — ABNORMAL HIGH (ref 10.4–31.8)
TIBC: 199 ug/dL — ABNORMAL LOW (ref 250–450)
UIBC: 134 ug/dL

## 2016-06-25 LAB — FERRITIN: FERRITIN: 1273 ng/mL — AB (ref 11–307)

## 2016-06-25 LAB — POCT HEMOGLOBIN-HEMACUE: Hemoglobin: 11.3 g/dL — ABNORMAL LOW (ref 12.0–15.0)

## 2016-06-25 MED ORDER — EPOETIN ALFA 10000 UNIT/ML IJ SOLN
INTRAMUSCULAR | Status: AC
  Administered 2016-06-25: 13:00:00 10000 [IU] via SUBCUTANEOUS
  Filled 2016-06-25: qty 1

## 2016-06-25 MED ORDER — EPOETIN ALFA 10000 UNIT/ML IJ SOLN
10000.0000 [IU] | INTRAMUSCULAR | Status: DC
  Administered 2016-06-25: 10000 [IU] via SUBCUTANEOUS

## 2016-07-06 ENCOUNTER — Other Ambulatory Visit: Payer: Self-pay | Admitting: Endocrinology

## 2016-07-09 DIAGNOSIS — R109 Unspecified abdominal pain: Secondary | ICD-10-CM | POA: Diagnosis not present

## 2016-07-10 ENCOUNTER — Ambulatory Visit (INDEPENDENT_AMBULATORY_CARE_PROVIDER_SITE_OTHER): Payer: Medicare Other | Admitting: Endocrinology

## 2016-07-10 VITALS — BP 134/86 | HR 78 | Ht 63.0 in | Wt 243.0 lb

## 2016-07-10 DIAGNOSIS — N183 Chronic kidney disease, stage 3 unspecified: Secondary | ICD-10-CM

## 2016-07-10 DIAGNOSIS — E1122 Type 2 diabetes mellitus with diabetic chronic kidney disease: Secondary | ICD-10-CM | POA: Diagnosis not present

## 2016-07-10 DIAGNOSIS — I2510 Atherosclerotic heart disease of native coronary artery without angina pectoris: Secondary | ICD-10-CM

## 2016-07-10 LAB — POCT GLYCOSYLATED HEMOGLOBIN (HGB A1C): Hemoglobin A1C: 6.3

## 2016-07-10 MED ORDER — LEVOTHYROXINE SODIUM 50 MCG PO TABS: ORAL_TABLET | ORAL | 3 refills | Status: DC

## 2016-07-10 MED ORDER — SITAGLIPTIN PHOSPHATE 100 MG PO TABS: ORAL_TABLET | ORAL | 3 refills | Status: DC

## 2016-07-10 NOTE — Progress Notes (Signed)
Subjective:    Patient ID: Stacy Walls, female    DOB: 09-Dec-1932, 83 y.o.   MRN: [id number]  HPI Pt returns for f/u of diabetes mellitus: DM type: 2  Dx'ed: 3748 Complications: renal failure and CAD Therapy: januvia.   GDM: never DKA: never Severe hypoglycemia: never Pancreatitis: never Other: she has never taken insulin.  She is too ill to undergo weight-loss surgery.   Interval history: 3 days ago, she called 911, due to chest pain x 10 minutes at rest. It resolved without rx, and has not recurred since then.   Past Medical History:  Diagnosis Date  . Acute cholecystitis   . Allergic rhinitis   . Anemia   . Chest pain   . Chronic renal insufficiency   . Clostridium difficile colitis   . Coronary atherosclerosis of native coronary vessel   . Cough   . Diabetes mellitus   . Dizziness   . DM2 (diabetes mellitus, type 2) ([place])   . Dyshidrosis   . GERD (gastroesophageal reflux disease)   . Gout   . HLD (hyperlipidemia)   . HTN (hypertension)   . Hypothyroidism   . Neck mass   . Neck pain   . Osteoporosis   . Routine general medical examination at a health care facility   . Secondary hyperparathyroidism (of renal origin)   . Urinary incontinence   . Urinary tract infection     Past Surgical History:  Procedure Laterality Date  . bone density  08/21/05  . C-section (other)  1977  . CHOLECYSTECTOMY    . ELECTROCARDIOGRAM  02/01/06  . L ankle surgery  1988  . total left knee  2007    Social History   Social History  . Marital status: Married    Spouse name: N/A  . Number of children: N/A  . Years of education: N/A   Occupational History  . Not on file.   Social History Main Topics  . Smoking status: Never Smoker  . Smokeless tobacco: Not on file  . Alcohol use No  . Drug use: No  . Sexual activity: Not on file   Other Topics Concern  . Not on file   Social History Narrative   Lives with husband and has lived in [place] for over 30 years.      Current Outpatient Prescriptions on File Prior to Visit  Medication Sig Dispense Refill  . allopurinol (ZYLOPRIM) 300 MG tablet TAKE 1 TABLET BY MOUTH EVERY DAY 90 tablet 0  . aspirin (ECOTRIN LOW STRENGTH) 81 MG EC tablet Take 81 mg by mouth daily.      Marland Kitchen atorvastatin (LIPITOR) 80 MG tablet Take 1 tablet (80 mg total) by mouth daily. 90 tablet 3  . diclofenac sodium (VOLTAREN) 1 % GEL Apply 2 g topically 4 (four) times daily. 100 g 5  . epoetin alfa (PROCRIT) 27078 UNIT/ML injection Inject 10,000 Units into the skin every 30 (thirty) days. Dr. Justin Mend    . Ferrous Sulfate (IRON) 325 (65 FE) MG TABS Take 1 tablet by mouth daily.     . furosemide (LASIX) 20 MG tablet TAKE 1 TABLET(20 MG) BY MOUTH DAILY 90 tablet 0  . losartan-hydrochlorothiazide (HYZAAR) 100-25 MG tablet TAKE 1 TABLET BY MOUTH DAILY 90 tablet 0  . omeprazole (PRILOSEC) 20 MG capsule TAKE 1 CAPSULE BY MOUTH EVERY DAY 90 capsule 0  . oxybutynin (DITROPAN) 5 MG tablet TAKE 1 TABLET(5 MG) BY MOUTH DAILY 90 tablet 0  . triamcinolone ointment (  KENALOG) 0.1 % APPLY TOPICALLY TO THE AFFECTED AREA THREE TIMES DAILY AS NEEDED FOR ITCHING 454 g 0   No current facility-administered medications on file prior to visit.     Allergies  Allergen Reactions  . Azithromycin   . Pioglitazone     REACTION: edema  . Sulfonamide Derivatives     REACTION: rash  . Tramadol     dizziness    Family History  Problem Relation Age of Onset  . Coronary artery disease Father   . Stroke Father   . Diabetes Father   . Diabetes Sister   . Stroke Sister   . Diabetes Brother     BP 134/86   Pulse 78   Ht 5\' 3"  (1.6 m)   Wt 243 lb (110.2 kg)   SpO2 96%   BMI 43.05 kg/m    Review of Systems Denies LOC and hypoglycemia.      Objective:   Physical Exam VITAL SIGNS:  See vs page GENERAL: no distress.  LUNGS:  Clear to auscultation HEART:  Regular rate and rhythm without murmurs noted. Normal S1,S2.   Pulses: dorsalis pedis intact  bilat.   MSK: no deformity of the feet.  CV: trace bilat leg edema Skin:  no ulcer on the feet.  normal color and temp on the feet. Neuro: sensation is intact to touch on the feet.  Ext: There is bilateral onychomycosis of the toenails.   Lab Results  Component Value Date   CREATININE 1.70 (H) 04/01/2015   BUN 26 (H) 04/01/2015   NA 137 04/01/2015   K 3.6 04/01/2015   CL 101 04/01/2015   CO2 29 04/01/2015   a1c=6.3%  I personally reviewed electrocardiogram tracing (done by paramedics, on 07/07/16): Indication: chest pain Impression: NSR.  No MI.  No hypertrophy.  Normal. Compared to 2014: no change (pt brings--scanned into epic)     Assessment & Plan:  Chest pain, new.  Although atypical for angina, she needs cardiol f/u anyway Type 2 SM, with renal insuff: well-controlled.  Please continue the same medication. Patient is advised the following: Patient Instructions  Please continue the same medications. Please see a heart specialist.  you will receive a phone call, about a day and time for an appointment. Please come back for a follow-up appointment in 6 months.

## 2016-07-10 NOTE — Patient Instructions (Signed)
Please continue the same medications. Please see a heart specialist.  you will receive a phone call, about a day and time for an appointment. Please come back for a follow-up appointment in 6 months.

## 2016-07-16 ENCOUNTER — Encounter: Payer: Self-pay | Admitting: Cardiology

## 2016-07-23 ENCOUNTER — Ambulatory Visit (HOSPITAL_COMMUNITY)
Admission: RE | Admit: 2016-07-23 | Discharge: 2016-07-23 | Disposition: A | Discharge status: Home / Self Care | Payer: Medicare Other | Source: Ambulatory Visit | Attending: Nephrology | Admitting: Nephrology

## 2016-07-23 ENCOUNTER — Encounter (HOSPITAL_COMMUNITY): Payer: Medicare Other

## 2016-07-23 DIAGNOSIS — D638 Anemia in other chronic diseases classified elsewhere: Secondary | ICD-10-CM | POA: Diagnosis not present

## 2016-07-23 DIAGNOSIS — N183 Chronic kidney disease, stage 3 unspecified: Secondary | ICD-10-CM

## 2016-07-23 LAB — POCT HEMOGLOBIN-HEMACUE: Hemoglobin: 11.4 g/dL — ABNORMAL LOW (ref 12.0–15.0)

## 2016-07-23 LAB — IRON AND TIBC
Iron: 67 ug/dL (ref 28–170)
SATURATION RATIOS: 32 % — AB (ref 10.4–31.8)
TIBC: 211 ug/dL — ABNORMAL LOW (ref 250–450)
UIBC: 144 ug/dL

## 2016-07-23 LAB — FERRITIN: Ferritin: 1013 ng/mL — ABNORMAL HIGH (ref 11–307)

## 2016-07-23 MED ORDER — EPOETIN ALFA 10000 UNIT/ML IJ SOLN
10000.0000 [IU] | INTRAMUSCULAR | Status: DC
  Administered 2016-07-23: 10000 [IU] via SUBCUTANEOUS

## 2016-07-23 MED ORDER — EPOETIN ALFA 10000 UNIT/ML IJ SOLN
INTRAMUSCULAR | Status: AC
  Administered 2016-07-23: 13:00:00 10000 [IU] via SUBCUTANEOUS
  Filled 2016-07-23: qty 1

## 2016-07-27 ENCOUNTER — Other Ambulatory Visit: Payer: Self-pay | Admitting: Endocrinology

## 2016-07-28 ENCOUNTER — Encounter: Payer: Self-pay | Admitting: Endocrinology

## 2016-07-28 ENCOUNTER — Ambulatory Visit (INDEPENDENT_AMBULATORY_CARE_PROVIDER_SITE_OTHER): Payer: Medicare Other | Admitting: Endocrinology

## 2016-07-28 DIAGNOSIS — M1990 Unspecified osteoarthritis, unspecified site: Secondary | ICD-10-CM | POA: Diagnosis not present

## 2016-07-28 DIAGNOSIS — I2510 Atherosclerotic heart disease of native coronary artery without angina pectoris: Secondary | ICD-10-CM | POA: Diagnosis not present

## 2016-07-28 MED ORDER — DICLOFENAC SODIUM 1 % TD GEL: 2.0000 g | Freq: Four times a day (QID) | TRANSDERMAL | 5 refills | Status: DC

## 2016-07-28 MED ORDER — CEFUROXIME AXETIL 250 MG PO TABS: 250.0000 mg | ORAL_TABLET | Freq: Two times a day (BID) | ORAL | 0 refills | Status: AC

## 2016-07-28 NOTE — Progress Notes (Signed)
Subjective:    Patient ID: Stacy Walls, female    DOB: [birth year], 83 y.o.   MRN: [id number]  HPI Pt states 12 days of moderate pain at the left lateral neck, rad to the LUE.  No assoc numbness. She insists this is due to an infection.   Past Medical History:  Diagnosis Date  . Acute cholecystitis   . Allergic rhinitis   . Anemia   . Chest pain   . Chronic renal insufficiency   . Clostridium difficile colitis   . Coronary atherosclerosis of native coronary vessel   . Cough   . Diabetes mellitus   . Dizziness   . DM2 (diabetes mellitus, type 2) ([place])   . Dyshidrosis   . GERD (gastroesophageal reflux disease)   . Gout   . HLD (hyperlipidemia)   . HTN (hypertension)   . Hypothyroidism   . Neck mass   . Neck pain   . Osteoporosis   . Routine general medical examination at a health care facility   . Secondary hyperparathyroidism (of renal origin)   . Urinary incontinence   . Urinary tract infection     Past Surgical History:  Procedure Laterality Date  . bone density  08/21/05  . C-section (other)  1977  . CHOLECYSTECTOMY    . ELECTROCARDIOGRAM  02/01/06  . L ankle surgery  1988  . total left knee  2007    Social History   Social History  . Marital status: Married    Spouse name: N/A  . Number of children: N/A  . Years of education: N/A   Occupational History  . Not on file.   Social History Main Topics  . Smoking status: Never Smoker  . Smokeless tobacco: Never Used  . Alcohol use No  . Drug use: No  . Sexual activity: Not on file   Other Topics Concern  . Not on file   Social History Narrative   Lives with husband and has lived in [place] for over 30 years.     Current Outpatient Prescriptions on File Prior to Visit  Medication Sig Dispense Refill  . allopurinol (ZYLOPRIM) 300 MG tablet TAKE 1 TABLET BY MOUTH EVERY DAY 90 tablet 0  . aspirin (ECOTRIN LOW STRENGTH) 81 MG EC tablet Take 81 mg by mouth daily.      Marland Kitchen atorvastatin (LIPITOR)  80 MG tablet Take 1 tablet (80 mg total) by mouth daily. 90 tablet 3  . epoetin alfa (PROCRIT) 51700 UNIT/ML injection Inject 10,000 Units into the skin every 30 (thirty) days. Dr. Justin Mend    . Ferrous Sulfate (IRON) 325 (65 FE) MG TABS Take 1 tablet by mouth daily.     . furosemide (LASIX) 20 MG tablet TAKE 1 TABLET(20 MG) BY MOUTH DAILY 90 tablet 0  . levothyroxine (SYNTHROID, LEVOTHROID) 50 MCG tablet TAKE 1 TABLET BY MOUTH DAILY BEFORE BREAKFAST 90 tablet 3  . losartan-hydrochlorothiazide (HYZAAR) 100-25 MG tablet TAKE 1 TABLET BY MOUTH DAILY 90 tablet 0  . omeprazole (PRILOSEC) 20 MG capsule TAKE 1 CAPSULE BY MOUTH EVERY DAY 90 capsule 0  . oxybutynin (DITROPAN) 5 MG tablet TAKE 1 TABLET(5 MG) BY MOUTH DAILY 90 tablet 0  . sitaGLIPtin (JANUVIA) 100 MG tablet TAKE 1/2 TABLET(50 MG) BY MOUTH DAILY 45 tablet 3  . triamcinolone ointment (KENALOG) 0.1 % APPLY TOPICALLY TO THE AFFECTED AREA THREE TIMES DAILY AS NEEDED FOR ITCHING 454 g 0   No current facility-administered medications on file prior to visit.  Allergies  Allergen Reactions  . Azithromycin   . Pioglitazone     REACTION: edema  . Sulfonamide Derivatives     REACTION: rash  . Tramadol     dizziness    Family History  Problem Relation Age of Onset  . Coronary artery disease Father   . Stroke Father   . Diabetes Father   . Diabetes Sister   . Stroke Sister   . Diabetes Brother     BP (!) 144/86   Pulse 64   Ht 5\' 3"  (1.6 m)   Wt 247 lb (112 kg)   SpO2 96%   BMI 43.75 kg/m    Review of Systems Denies rash.  She says left neck is slightly swollen.      Objective:   Physical Exam VITAL SIGNS:  See vs page GENERAL: no distress NECK: There is no palpable thyroid enlargement.  No neck nodule is palpable.  No neck swelling is noted.  No palpable lymphadenopathy at the neck Neuro: sensation is intact to touch throughout the LUE.  MSK: Full ROM throughout the LUE.    C-spine (2008): Degenerative disc disease at  C5-6 with moderate foraminal narrowing bilaterally at that level.   Lab Results  Component Value Date   CREATININE 1.70 (H) 04/01/2015   BUN 26 (H) 04/01/2015   NA 137 04/01/2015   K 3.6 04/01/2015   CL 101 04/01/2015   CO2 29 04/01/2015      Assessment & Plan:  Neck pain, recurrent: prob due to OA DM: she is not a candidate for steroid rx Renal insuff: she is not a candidate for NSAID.   Patient Instructions  I have sent 2 prescriptions to your pharmacy: antibiotic and gel. I hope you feel better soon.  If you don't feel better by next week, please call back.  Please call sooner if you get worse.

## 2016-07-28 NOTE — Patient Instructions (Signed)
I have sent 2 prescriptions to your pharmacy: antibiotic and gel. I hope you feel better soon.  If you don't feel better by next week, please call back.  Please call sooner if you get worse.

## 2016-07-29 DIAGNOSIS — M1990 Unspecified osteoarthritis, unspecified site: Secondary | ICD-10-CM | POA: Insufficient documentation

## 2016-07-31 ENCOUNTER — Encounter: Payer: Self-pay | Admitting: Cardiology

## 2016-07-31 ENCOUNTER — Ambulatory Visit (INDEPENDENT_AMBULATORY_CARE_PROVIDER_SITE_OTHER): Payer: Medicare Other | Admitting: Cardiology

## 2016-07-31 ENCOUNTER — Ambulatory Visit: Payer: Medicare Other | Admitting: Cardiology

## 2016-07-31 ENCOUNTER — Encounter (INDEPENDENT_AMBULATORY_CARE_PROVIDER_SITE_OTHER): Payer: Self-pay

## 2016-07-31 VITALS — BP 144/80 | HR 64 | Ht 62.0 in | Wt 248.4 lb

## 2016-07-31 DIAGNOSIS — R0789 Other chest pain: Secondary | ICD-10-CM | POA: Diagnosis not present

## 2016-07-31 DIAGNOSIS — I2510 Atherosclerotic heart disease of native coronary artery without angina pectoris: Secondary | ICD-10-CM

## 2016-07-31 DIAGNOSIS — E1121 Type 2 diabetes mellitus with diabetic nephropathy: Secondary | ICD-10-CM

## 2016-07-31 NOTE — Patient Instructions (Signed)
Medication Instructions:  The current medical regimen is effective;  continue present plan and medications.  Follow-Up: Follow up as needed by Dr Marlou Porch.  Thank you for choosing [facility]!!

## 2016-07-31 NOTE — Progress Notes (Signed)
Cardiology Office Note    Date:  07/31/2016   ID:  Stacy Walls, DOB 1932-12-16, MRN [id number]  PCP:  Renato Shin, MD  Cardiologist:   Candee Furbish, MD   No chief complaint on file.   History of Present Illness:  Stacy Walls is a 83 y.o. female former patient of Dr. Verl Blalock here for evaluation of possible coronary artery disease given her diabetes with hyperlipidemia, chronic kidney disease, diabetes with comp location is of renal dysfunction.  In review of prior note from Dr. Verl Blalock, stress test in 2007 showed inferior wall hypokinesis suggestive of ischemia with a perfusion scan. She is diabetic and most likely has some coronary disease he states. Her prior creatinine of 2.5 is one of the reasons why we avoided cardiac catheterization in the past.  3 weeks ago woke up with CP, central, daughter called EMS. ECG done. Looked "OK". CP went away when sitting there. Has not felt it since. No exertional change. Took gas medicine and it went away. Overall she is feeling very well currently. No complaints.  No CP, no SOB, no syncope.     Past Medical History:  Diagnosis Date  . Acute cholecystitis   . Allergic rhinitis   . Anemia   . Chest pain   . Chronic renal insufficiency   . Clostridium difficile colitis   . Coronary atherosclerosis of native coronary vessel   . Cough   . Diabetes mellitus   . Dizziness   . DM2 (diabetes mellitus, type 2) ([place])   . Dyshidrosis   . GERD (gastroesophageal reflux disease)   . Gout   . HLD (hyperlipidemia)   . HTN (hypertension)   . Hypothyroidism   . Neck mass   . Neck pain   . Osteoporosis   . Routine general medical examination at a health care facility   . Secondary hyperparathyroidism (of renal origin)   . Urinary incontinence   . Urinary tract infection     Past Surgical History:  Procedure Laterality Date  . bone density  08/21/05  . C-section (other)  1977  . CHOLECYSTECTOMY    . ELECTROCARDIOGRAM  02/01/06  . L  ankle surgery  1988  . total left knee  2007    Current Medications: Outpatient Medications Prior to Visit  Medication Sig Dispense Refill  . allopurinol (ZYLOPRIM) 300 MG tablet TAKE 1 TABLET BY MOUTH EVERY DAY 90 tablet 0  . aspirin (ECOTRIN LOW STRENGTH) 81 MG EC tablet Take 81 mg by mouth daily.      Marland Kitchen atorvastatin (LIPITOR) 80 MG tablet Take 1 tablet (80 mg total) by mouth daily. 90 tablet 3  . cefUROXime (CEFTIN) 250 MG tablet Take 1 tablet (250 mg total) by mouth 2 (two) times daily. 14 tablet 0  . diclofenac sodium (VOLTAREN) 1 % GEL Apply 2 g topically 4 (four) times daily. 100 g 5  . epoetin alfa (PROCRIT) 14431 UNIT/ML injection Inject 10,000 Units into the skin every 30 (thirty) days. Dr. Justin Mend    . Ferrous Sulfate (IRON) 325 (65 FE) MG TABS Take 1 tablet by mouth daily.     . furosemide (LASIX) 20 MG tablet TAKE 1 TABLET(20 MG) BY MOUTH DAILY 90 tablet 0  . levothyroxine (SYNTHROID, LEVOTHROID) 50 MCG tablet TAKE 1 TABLET BY MOUTH DAILY BEFORE BREAKFAST 90 tablet 3  . losartan-hydrochlorothiazide (HYZAAR) 100-25 MG tablet TAKE 1 TABLET BY MOUTH DAILY 90 tablet 0  . omeprazole (PRILOSEC) 20 MG capsule TAKE 1 CAPSULE  BY MOUTH EVERY DAY 90 capsule 0  . oxybutynin (DITROPAN) 5 MG tablet TAKE 1 TABLET(5 MG) BY MOUTH DAILY 90 tablet 0  . sitaGLIPtin (JANUVIA) 100 MG tablet TAKE 1/2 TABLET(50 MG) BY MOUTH DAILY 45 tablet 3  . triamcinolone ointment (KENALOG) 0.1 % APPLY TOPICALLY TO THE AFFECTED AREA THREE TIMES DAILY AS NEEDED FOR ITCHING 454 g 0   No facility-administered medications prior to visit.      Allergies:   Azithromycin; Pioglitazone; Sulfonamide derivatives; and Tramadol   Social History   Social History  . Marital status: Married    Spouse name: N/A  . Number of children: N/A  . Years of education: N/A   Social History Main Topics  . Smoking status: Never Smoker  . Smokeless tobacco: Never Used  . Alcohol use No  . Drug use: No  . Sexual activity: Not on  file   Other Topics Concern  . Not on file   Social History Narrative   Lives with husband and has lived in [place] for over 30 years.      Family History:  The patient's family history includes Coronary artery disease in her father; Diabetes in her brother, father, and sister; Stroke in her father and sister.   ROS:   Please see the history of present illness.    ROS All other systems reviewed and are negative.   PHYSICAL EXAM:   VS:  BP (!) 144/80   Pulse 64   Ht 5\' 2"  (1.575 m)   Wt 248 lb 6 oz (112.7 kg)   SpO2 97%   BMI 45.43 kg/m    GEN: Well nourished, well developed, in no acute distress  HEENT: normal  Neck: no JVD, carotid bruits, or masses Cardiac: RRR with ectopy; no murmurs, rubs, or gallops, chronic soft pedal edema  Respiratory:  clear to auscultation bilaterally, normal work of breathing GI: soft, nontender, nondistended, + BS, obese MS: no deformity or atrophy  Skin: warm and dry, no rash Neuro:  Alert and Oriented x 3, Strength and sensation are intact, ambulates with a cane Psych: euthymic mood, full affect  Wt Readings from Last 3 Encounters:  07/31/16 248 lb 6 oz (112.7 kg)  07/28/16 247 lb (112 kg)  07/10/16 243 lb (110.2 kg)      Studies/Labs Reviewed:   EKG:  EKG is ordered today.  The ekg ordered today demonstrates Sinus rhythm heart rate 64 with 3 PVCs 2 different morphologies nonspecific ST T-wave changes. Personally viewed  Recent Labs: 07/23/2016: Hemoglobin 11.4   Lipid Panel    Component Value Date/Time   CHOL 154 04/01/2015 1200   TRIG 112.0 04/01/2015 1200   HDL 46.00 04/01/2015 1200   CHOLHDL 3 04/01/2015 1200   VLDL 22.4 04/01/2015 1200   LDLCALC 86 04/01/2015 1200   LDLDIRECT 108.6 11/10/2010 1616    Additional studies/ records that were reviewed today include:  Prior office notes, EKG, lab work reviewed Stress Myoview in October 2007 showed inferior wall hypokinesis with EF of 56.   ASSESSMENT:    1. Atypical  chest pain   2. Controlled type 2 diabetes mellitus with diabetic nephropathy, unspecified long term insulin use status ([place])   3. Morbid obesity ([place])      PLAN:  In order of problems listed above:  Atypical chest pain  - May have been GI related as it promptly resolved after taking her "gas medicine ". Stopped spontaneously. No associated shortness of breath. She has not had any  episodes since. No exertional component. If symptoms return or become more worrisome, we will have a low threshold for further cardiac testing. Right now given the atypical nature of the chest pain, no further testing. She is fine with this. Continue with aggressive secondary prevention. Medications reviewed  Diabetes with renal manifestations and hypertension  - Continue with blood pressure control, medications reviewed, per Dr. Loanne Drilling. Excellent.  Morbid obesity  - Continue to encourage weight loss. Ambulates with a cane. This will help diabetes  PVCs  - PVCs noted on EKG. Asymptomatic. No changes.  Hyperlipidemia  - Continue with atorvastatin. Given her age, may consider decreasing atorvastatin 40 mg.  If symptoms return or become more worrisome, please let us know and we will have low threshold for possible stress test.    Medication Adjustments/Labs and Tests Ordered: Current medicines are reviewed at length with the patient today.  Concerns regarding medicines are outlined above.  Medication changes, Labs and Tests ordered today are listed in the Patient Instructions below. Patient Instructions  Medication Instructions:  The current medical regimen is effective;  continue present plan and medications.  Follow-Up: Follow up as needed by Dr Marlou Porch.  Thank you for choosing [facility]!!        Signed, Candee Furbish, MD  07/31/2016 9:39 AM    [address], [place],   [address] Phone: [phone]; Fax: [phone]

## 2016-08-20 ENCOUNTER — Encounter (HOSPITAL_COMMUNITY)
Admission: RE | Admit: 2016-08-20 | Discharge: 2016-08-20 | Disposition: A | Discharge status: Home / Self Care | Payer: Medicare Other | Source: Ambulatory Visit | Attending: Nephrology | Admitting: Nephrology

## 2016-08-20 ENCOUNTER — Other Ambulatory Visit: Payer: Self-pay | Admitting: Endocrinology

## 2016-08-20 DIAGNOSIS — D631 Anemia in chronic kidney disease: Secondary | ICD-10-CM | POA: Diagnosis not present

## 2016-08-20 DIAGNOSIS — N183 Chronic kidney disease, stage 3 unspecified: Secondary | ICD-10-CM

## 2016-08-20 LAB — POCT HEMOGLOBIN-HEMACUE: HEMOGLOBIN: 11.4 g/dL — AB (ref 12.0–15.0)

## 2016-08-20 LAB — IRON AND TIBC
IRON: 60 ug/dL (ref 28–170)
Saturation Ratios: 30 % (ref 10.4–31.8)
TIBC: 197 ug/dL — AB (ref 250–450)
UIBC: 137 ug/dL

## 2016-08-20 LAB — FERRITIN: FERRITIN: 839 ng/mL — AB (ref 11–307)

## 2016-08-20 MED ORDER — EPOETIN ALFA 10000 UNIT/ML IJ SOLN
INTRAMUSCULAR | Status: AC
  Filled 2016-08-20: qty 1

## 2016-08-20 MED ORDER — EPOETIN ALFA 10000 UNIT/ML IJ SOLN
10000.0000 [IU] | INTRAMUSCULAR | Status: DC
  Administered 2016-08-20: 10000 [IU] via SUBCUTANEOUS

## 2016-08-21 ENCOUNTER — Encounter (HOSPITAL_COMMUNITY): Payer: Medicare Other

## 2016-09-17 ENCOUNTER — Encounter (HOSPITAL_COMMUNITY)
Admission: RE | Admit: 2016-09-17 | Discharge: 2016-09-17 | Disposition: A | Discharge status: Home / Self Care | Payer: Medicare Other | Source: Ambulatory Visit | Attending: Nephrology | Admitting: Nephrology

## 2016-09-17 DIAGNOSIS — N183 Chronic kidney disease, stage 3 unspecified: Secondary | ICD-10-CM

## 2016-09-17 DIAGNOSIS — D631 Anemia in chronic kidney disease: Secondary | ICD-10-CM | POA: Diagnosis not present

## 2016-09-17 LAB — POCT HEMOGLOBIN-HEMACUE: Hemoglobin: 11.6 g/dL — ABNORMAL LOW (ref 12.0–15.0)

## 2016-09-17 MED ORDER — EPOETIN ALFA 10000 UNIT/ML IJ SOLN
10000.0000 [IU] | INTRAMUSCULAR | Status: DC
  Administered 2016-09-17: 14:00:00 10000 [IU] via SUBCUTANEOUS

## 2016-09-17 MED ORDER — EPOETIN ALFA 10000 UNIT/ML IJ SOLN
INTRAMUSCULAR | Status: AC
  Filled 2016-09-17: qty 1

## 2016-10-08 ENCOUNTER — Other Ambulatory Visit: Payer: Self-pay | Admitting: Endocrinology

## 2016-10-09 ENCOUNTER — Other Ambulatory Visit: Payer: Self-pay | Admitting: Endocrinology

## 2016-10-15 ENCOUNTER — Encounter (HOSPITAL_COMMUNITY)
Admission: RE | Admit: 2016-10-15 | Discharge: 2016-10-15 | Disposition: A | Discharge status: Home / Self Care | Payer: Medicare Other | Source: Ambulatory Visit | Attending: Nephrology | Admitting: Nephrology

## 2016-10-15 DIAGNOSIS — N183 Chronic kidney disease, stage 3 unspecified: Secondary | ICD-10-CM

## 2016-10-15 DIAGNOSIS — D631 Anemia in chronic kidney disease: Secondary | ICD-10-CM | POA: Insufficient documentation

## 2016-10-15 LAB — IRON AND TIBC
Iron: 60 ug/dL (ref 28–170)
SATURATION RATIOS: 31 % (ref 10.4–31.8)
TIBC: 196 ug/dL — ABNORMAL LOW (ref 250–450)
UIBC: 136 ug/dL

## 2016-10-15 LAB — FERRITIN: Ferritin: 930 ng/mL — ABNORMAL HIGH (ref 11–307)

## 2016-10-15 MED ORDER — EPOETIN ALFA 10000 UNIT/ML IJ SOLN
INTRAMUSCULAR | Status: AC
  Filled 2016-10-15: qty 1

## 2016-10-15 MED ORDER — EPOETIN ALFA 10000 UNIT/ML IJ SOLN
10000.0000 [IU] | INTRAMUSCULAR | Status: DC
  Administered 2016-10-15: 14:00:00 10000 [IU] via SUBCUTANEOUS

## 2016-10-16 LAB — POCT HEMOGLOBIN-HEMACUE: HEMOGLOBIN: 11.4 g/dL — AB (ref 12.0–15.0)

## 2016-10-26 ENCOUNTER — Ambulatory Visit: Payer: Medicare Other | Admitting: Endocrinology

## 2016-11-11 ENCOUNTER — Other Ambulatory Visit (HOSPITAL_COMMUNITY): Payer: Self-pay | Admitting: *Deleted

## 2016-11-12 ENCOUNTER — Encounter (HOSPITAL_COMMUNITY)
Admission: RE | Admit: 2016-11-12 | Discharge: 2016-11-12 | Disposition: A | Discharge status: Home / Self Care | Payer: Medicare Other | Source: Ambulatory Visit | Attending: Nephrology | Admitting: Nephrology

## 2016-11-12 DIAGNOSIS — D631 Anemia in chronic kidney disease: Secondary | ICD-10-CM | POA: Insufficient documentation

## 2016-11-12 DIAGNOSIS — N183 Chronic kidney disease, stage 3 unspecified: Secondary | ICD-10-CM

## 2016-11-12 LAB — IRON AND TIBC
IRON: 64 ug/dL (ref 28–170)
SATURATION RATIOS: 32 % — AB (ref 10.4–31.8)
TIBC: 197 ug/dL — AB (ref 250–450)
UIBC: 133 ug/dL

## 2016-11-12 LAB — FERRITIN: FERRITIN: 984 ng/mL — AB (ref 11–307)

## 2016-11-12 LAB — POCT HEMOGLOBIN-HEMACUE: Hemoglobin: 11.2 g/dL — ABNORMAL LOW (ref 12.0–15.0)

## 2016-11-12 MED ORDER — EPOETIN ALFA 10000 UNIT/ML IJ SOLN
INTRAMUSCULAR | Status: AC
  Filled 2016-11-12: qty 1

## 2016-11-12 MED ORDER — EPOETIN ALFA 10000 UNIT/ML IJ SOLN
10000.0000 [IU] | INTRAMUSCULAR | Status: DC
  Administered 2016-11-12: 10000 [IU] via SUBCUTANEOUS

## 2016-11-17 ENCOUNTER — Other Ambulatory Visit: Payer: Self-pay | Admitting: Endocrinology

## 2016-12-03 ENCOUNTER — Other Ambulatory Visit: Payer: Self-pay | Admitting: Endocrinology

## 2016-12-10 ENCOUNTER — Encounter (HOSPITAL_COMMUNITY): Payer: Medicare Other

## 2016-12-17 ENCOUNTER — Encounter (HOSPITAL_COMMUNITY)
Admission: RE | Admit: 2016-12-17 | Discharge: 2016-12-17 | Disposition: A | Discharge status: Home / Self Care | Payer: Medicare Other | Source: Ambulatory Visit | Attending: Nephrology | Admitting: Nephrology

## 2016-12-17 DIAGNOSIS — D631 Anemia in chronic kidney disease: Secondary | ICD-10-CM | POA: Diagnosis not present

## 2016-12-17 DIAGNOSIS — N183 Chronic kidney disease, stage 3 unspecified: Secondary | ICD-10-CM

## 2016-12-17 LAB — FERRITIN: Ferritin: 985 ng/mL — ABNORMAL HIGH (ref 11–307)

## 2016-12-17 LAB — IRON AND TIBC
IRON: 68 ug/dL (ref 28–170)
Saturation Ratios: 37 % — ABNORMAL HIGH (ref 10.4–31.8)
TIBC: 186 ug/dL — AB (ref 250–450)
UIBC: 118 ug/dL

## 2016-12-17 LAB — POCT HEMOGLOBIN-HEMACUE: HEMOGLOBIN: 10.7 g/dL — AB (ref 12.0–15.0)

## 2016-12-17 MED ORDER — EPOETIN ALFA 10000 UNIT/ML IJ SOLN
10000.0000 [IU] | INTRAMUSCULAR | Status: DC
  Administered 2016-12-17: 10000 [IU] via SUBCUTANEOUS

## 2016-12-17 MED ORDER — EPOETIN ALFA 10000 UNIT/ML IJ SOLN
INTRAMUSCULAR | Status: AC
  Administered 2016-12-17: 10000 [IU] via SUBCUTANEOUS
  Filled 2016-12-17: qty 1

## 2016-12-21 ENCOUNTER — Other Ambulatory Visit: Payer: Self-pay | Admitting: Endocrinology

## 2016-12-31 ENCOUNTER — Telehealth: Payer: Self-pay | Admitting: Endocrinology

## 2016-12-31 MED ORDER — SITAGLIPTIN PHOSPHATE 100 MG PO TABS: ORAL_TABLET | ORAL | 3 refills | Status: DC

## 2016-12-31 NOTE — Telephone Encounter (Signed)
Refill submitted. 

## 2016-12-31 NOTE — Telephone Encounter (Signed)
MEDICATION: [place]  PHARMACY:    [facility] [address] [place] [phone] [id number]  IS THIS A 90 DAY SUPPLY : yes  IS PATIENT OUT OF MEDICATION: yes  IF NOT; HOW MUCH IS LEFT:   LAST APPOINTMENT DATE: 04/10  NEXT APPOINTMENT DATE: 09/26  OTHER COMMENTS:    **Let patient know to contact pharmacy at the end of the day to make sure medication is ready. **  ** Please notify patient to allow 48-72 hours to process**  **Encourage patient to contact the pharmacy for refills or they can request refills through [facility]**

## 2017-01-04 DIAGNOSIS — E669 Obesity, unspecified: Secondary | ICD-10-CM | POA: Diagnosis not present

## 2017-01-04 DIAGNOSIS — N183 Chronic kidney disease, stage 3 (moderate): Secondary | ICD-10-CM | POA: Diagnosis not present

## 2017-01-04 DIAGNOSIS — N184 Chronic kidney disease, stage 4 (severe): Secondary | ICD-10-CM | POA: Diagnosis not present

## 2017-01-04 DIAGNOSIS — D631 Anemia in chronic kidney disease: Secondary | ICD-10-CM | POA: Diagnosis not present

## 2017-01-04 DIAGNOSIS — N2581 Secondary hyperparathyroidism of renal origin: Secondary | ICD-10-CM | POA: Diagnosis not present

## 2017-01-13 ENCOUNTER — Other Ambulatory Visit: Payer: Self-pay

## 2017-01-13 ENCOUNTER — Encounter: Payer: Self-pay | Admitting: Endocrinology

## 2017-01-13 ENCOUNTER — Ambulatory Visit (INDEPENDENT_AMBULATORY_CARE_PROVIDER_SITE_OTHER): Payer: Medicare Other | Admitting: Endocrinology

## 2017-01-13 VITALS — BP 124/78 | HR 59 | Wt 242.8 lb

## 2017-01-13 DIAGNOSIS — Z23 Encounter for immunization: Secondary | ICD-10-CM

## 2017-01-13 DIAGNOSIS — E1122 Type 2 diabetes mellitus with diabetic chronic kidney disease: Secondary | ICD-10-CM | POA: Diagnosis not present

## 2017-01-13 DIAGNOSIS — N183 Chronic kidney disease, stage 3 (moderate): Secondary | ICD-10-CM | POA: Diagnosis not present

## 2017-01-13 DIAGNOSIS — I2510 Atherosclerotic heart disease of native coronary artery without angina pectoris: Secondary | ICD-10-CM | POA: Diagnosis not present

## 2017-01-13 LAB — POCT GLYCOSYLATED HEMOGLOBIN (HGB A1C): Hemoglobin A1C: 6.5

## 2017-01-13 MED ORDER — CEPHALEXIN 250 MG PO CAPS: 250.0000 mg | ORAL_CAPSULE | Freq: Three times a day (TID) | ORAL | 0 refills | Status: DC

## 2017-01-13 NOTE — Patient Instructions (Addendum)
I have sent a prescription to your pharmacy, for an antibiotic pill.   Please continue the same [place].   Please come back for a follow-up appointment in 4 months.

## 2017-01-13 NOTE — Progress Notes (Signed)
Subjective:    Patient ID: Stacy Walls, female    DOB: 09-22-1932, 83 y.o.   MRN: [id number]  HPI Pt returns for f/u of diabetes mellitus: DM type: 2  Dx'ed: 4098 Complications: renal failure and CAD Therapy: januvia.   GDM: never DKA: never Severe hypoglycemia: never Pancreatitis: never Other: she has never taken insulin.  She is too ill to undergo weight-loss surgery; she declines metformin   Interval history: pt states few days of moderate pain at both ears,  No assoc congestion.   Past Medical History:  Diagnosis Date  . Acute cholecystitis   . Allergic rhinitis   . Anemia   . Chest pain   . Chronic renal insufficiency   . Clostridium difficile colitis   . Coronary atherosclerosis of native coronary vessel   . Cough   . Diabetes mellitus   . Dizziness   . DM2 (diabetes mellitus, type 2) ([place])   . Dyshidrosis   . GERD (gastroesophageal reflux disease)   . Gout   . HLD (hyperlipidemia)   . HTN (hypertension)   . Hypothyroidism   . Neck mass   . Neck pain   . Osteoporosis   . Routine general medical examination at a health care facility   . Secondary hyperparathyroidism (of renal origin)   . Urinary incontinence   . Urinary tract infection     Past Surgical History:  Procedure Laterality Date  . bone density  08/21/05  . C-section (other)  1977  . CHOLECYSTECTOMY    . ELECTROCARDIOGRAM  02/01/06  . L ankle surgery  1988  . total left knee  2007    Social History   Social History  . Marital status: Married    Spouse name: N/A  . Number of children: N/A  . Years of education: N/A   Occupational History  . Not on file.   Social History Main Topics  . Smoking status: Never Smoker  . Smokeless tobacco: Never Used  . Alcohol use No  . Drug use: No  . Sexual activity: Not on file   Other Topics Concern  . Not on file   Social History Narrative   Lives with husband and has lived in [place] for over 30 years.     Current Outpatient  Prescriptions on File Prior to Visit  Medication Sig Dispense Refill  . allopurinol (ZYLOPRIM) 300 MG tablet TAKE 1 TABLET BY MOUTH EVERY DAY 90 tablet 0  . aspirin (ECOTRIN LOW STRENGTH) 81 MG EC tablet Take 81 mg by mouth daily.      Marland Kitchen atorvastatin (LIPITOR) 80 MG tablet Take 1 tablet (80 mg total) by mouth daily. 90 tablet 3  . diclofenac sodium (VOLTAREN) 1 % GEL Apply 2 g topically 4 (four) times daily. 100 g 5  . epoetin alfa (PROCRIT) 11914 UNIT/ML injection Inject 10,000 Units into the skin every 30 (thirty) days. Dr. Justin Mend    . Ferrous Sulfate (IRON) 325 (65 FE) MG TABS Take 1 tablet by mouth daily.     . furosemide (LASIX) 20 MG tablet TAKE 1 TABLET(20 MG) BY MOUTH DAILY 90 tablet 0  . levothyroxine (SYNTHROID, LEVOTHROID) 50 MCG tablet TAKE 1 TABLET BY MOUTH DAILY BEFORE BREAKFAST 90 tablet 3  . losartan-hydrochlorothiazide (HYZAAR) 100-25 MG tablet TAKE 1 TABLET BY MOUTH DAILY 90 tablet 0  . omeprazole (PRILOSEC) 20 MG capsule TAKE 1 CAPSULE BY MOUTH EVERY DAY 90 capsule 0  . oxybutynin (DITROPAN) 5 MG tablet TAKE 1 TABLET(5 MG)  BY MOUTH DAILY 90 tablet 0  . sitaGLIPtin (JANUVIA) 100 MG tablet TAKE 1/2 TABLET(50 MG) BY MOUTH DAILY 45 tablet 3  . triamcinolone ointment (KENALOG) 0.1 % APPLY TOPICALLY TO THE AFFECTED AREA THREE TIMES DAILY AS NEEDED FOR ITCHING 454 g 0   No current facility-administered medications on file prior to visit.     Allergies  Allergen Reactions  . Azithromycin   . Pioglitazone     REACTION: edema  . Sulfonamide Derivatives     REACTION: rash  . Tramadol     dizziness    Family History  Problem Relation Age of Onset  . Coronary artery disease Father   . Stroke Father   . Diabetes Father   . Diabetes Sister   . Stroke Sister   . Diabetes Brother     BP 124/78   Pulse (!) 59   Wt 242 lb 12.8 oz (110.1 kg)   SpO2 97%   BMI 44.41 kg/m    Review of Systems Denies fever    Objective:   Physical Exam VITAL SIGNS:  See vs page GENERAL:  no distress Ears: both TM's are slightly red.   Pulses: foot pulses are intact bilaterally.   MSK: no deformity of the feet or ankles.  CV: trace bilat edema of the legs.  Skin:  no ulcer on the feet or ankles.  normal color and temp on the feet and ankles.  Neuro: sensation is intact to touch on the feet and ankles.     Lab Results  Component Value Date   HGBA1C 6.5 01/13/2017      Assessment & Plan:  URI: new.  Ty[e 2 DM, with renal failure: well-controlled.  Patient Instructions  I have sent a prescription to your pharmacy, for an antibiotic pill.   Please continue the same [place].   Please come back for a follow-up appointment in 4 months.

## 2017-01-14 ENCOUNTER — Encounter (HOSPITAL_COMMUNITY)
Admission: RE | Admit: 2017-01-14 | Discharge: 2017-01-14 | Disposition: A | Discharge status: Home / Self Care | Payer: Medicare Other | Source: Ambulatory Visit | Attending: Nephrology | Admitting: Nephrology

## 2017-01-14 DIAGNOSIS — N183 Chronic kidney disease, stage 3 unspecified: Secondary | ICD-10-CM

## 2017-01-14 DIAGNOSIS — D631 Anemia in chronic kidney disease: Secondary | ICD-10-CM | POA: Diagnosis not present

## 2017-01-14 LAB — IRON AND TIBC
IRON: 51 ug/dL (ref 28–170)
SATURATION RATIOS: 25 % (ref 10.4–31.8)
TIBC: 202 ug/dL — ABNORMAL LOW (ref 250–450)
UIBC: 151 ug/dL

## 2017-01-14 LAB — POCT HEMOGLOBIN-HEMACUE: Hemoglobin: 11.4 g/dL — ABNORMAL LOW (ref 12.0–15.0)

## 2017-01-14 LAB — FERRITIN: Ferritin: 963 ng/mL — ABNORMAL HIGH (ref 11–307)

## 2017-01-14 MED ORDER — EPOETIN ALFA 10000 UNIT/ML IJ SOLN
10000.0000 [IU] | INTRAMUSCULAR | Status: DC
  Administered 2017-01-14: 10000 [IU] via SUBCUTANEOUS

## 2017-01-14 MED ORDER — EPOETIN ALFA 10000 UNIT/ML IJ SOLN
INTRAMUSCULAR | Status: AC
  Administered 2017-01-14: 10000 [IU] via SUBCUTANEOUS
  Filled 2017-01-14: qty 1

## 2017-01-21 ENCOUNTER — Other Ambulatory Visit: Payer: Self-pay | Admitting: Endocrinology

## 2017-02-05 DIAGNOSIS — N184 Chronic kidney disease, stage 4 (severe): Secondary | ICD-10-CM | POA: Diagnosis not present

## 2017-02-11 ENCOUNTER — Ambulatory Visit (HOSPITAL_COMMUNITY)
Admission: RE | Admit: 2017-02-11 | Discharge: 2017-02-11 | Disposition: A | Discharge status: Home / Self Care | Payer: Medicare Other | Source: Ambulatory Visit | Attending: Nephrology | Admitting: Nephrology

## 2017-02-11 DIAGNOSIS — D631 Anemia in chronic kidney disease: Secondary | ICD-10-CM | POA: Insufficient documentation

## 2017-02-11 DIAGNOSIS — N183 Chronic kidney disease, stage 3 unspecified: Secondary | ICD-10-CM

## 2017-02-11 LAB — POCT HEMOGLOBIN-HEMACUE: Hemoglobin: 11.2 g/dL — ABNORMAL LOW (ref 12.0–15.0)

## 2017-02-11 LAB — IRON AND TIBC
IRON: 47 ug/dL (ref 28–170)
Saturation Ratios: 25 % (ref 10.4–31.8)
TIBC: 186 ug/dL — AB (ref 250–450)
UIBC: 139 ug/dL

## 2017-02-11 LAB — FERRITIN: Ferritin: 1122 ng/mL — ABNORMAL HIGH (ref 11–307)

## 2017-02-11 MED ORDER — EPOETIN ALFA 10000 UNIT/ML IJ SOLN
INTRAMUSCULAR | Status: AC
  Administered 2017-02-11: 13:00:00 10000 [IU]
  Filled 2017-02-11: qty 1

## 2017-02-11 MED ORDER — EPOETIN ALFA 10000 UNIT/ML IJ SOLN: 10000.0000 [IU] | INTRAMUSCULAR | Status: DC

## 2017-02-16 ENCOUNTER — Other Ambulatory Visit: Payer: Self-pay | Admitting: Endocrinology

## 2017-03-01 ENCOUNTER — Other Ambulatory Visit: Payer: Self-pay | Admitting: Endocrinology

## 2017-03-06 ENCOUNTER — Other Ambulatory Visit: Payer: Self-pay | Admitting: Endocrinology

## 2017-03-08 ENCOUNTER — Telehealth: Payer: Self-pay | Admitting: Cardiology

## 2017-03-08 NOTE — Telephone Encounter (Signed)
Patient calling, states that she accidentally mixed up her husband's medications and accidentally took them. Patient is nervous and would like to know what she should do.

## 2017-03-08 NOTE — Telephone Encounter (Signed)
Current Outpatient Prescriptions  Medication Sig Dispense Refill  . acetaminophen (TYLENOL) 325 MG tablet Take 325 mg by mouth every 6 (six) hours as needed.    Marland Kitchen amiodarone (PACERONE) 200 MG tablet Take 1 tablet (200 mg total) by mouth daily. 90 tablet 2  . apixaban (ELIQUIS) 5 MG TABS tablet Take 1 tablet (5 mg total) by mouth 2 (two) times daily. 60 tablet 11  . carvedilol (COREG) 6.25 MG tablet Take 1 tablet (6.25 mg total) by mouth 2 (two) times daily. 60 tablet 6  . cephALEXin (KEFLEX) 250 MG capsule Take 1 capsule (250 mg total) by mouth 3 (three) times daily. 21 capsule 0  . furosemide (LASIX) 40 MG tablet Take 1 tablet (40 mg total) by mouth daily. 90 tablet 3  . magnesium hydroxide (MILK OF MAGNESIA) 400 MG/5ML suspension Take 30 mLs by mouth daily as needed for mild constipation or indigestion.    . potassium chloride (K-DUR) 10 MEQ tablet Take 1 tablet (10 mEq total) by mouth daily. 30 tablet 6  . spironolactone (ALDACTONE) 25 MG tablet Take 1 tablet (25 mg total) by mouth 2 (two) times daily.      The above list is the patient's husband's medication list.  She believes she took his medications this morning.  She is questioning what she should do.  She reports feeling OK just that her head feels alittle "funny".  She has not taken her BP/HR since taking the medication.  Advised to check VS before taking her Furosemide or losartan/HCTZ.  If low she should hold her meds.  Advised to make sure she is drinking plenty of water today.  Advised pt to give her husband his medication as he has not taken his today.  She is to call back if any further side effects or questions.

## 2017-03-08 NOTE — Telephone Encounter (Signed)
Reviewed information with Fuller [place], pharmacist.  No change in advice-monitor VS, separate medications, c/b if further concerns.

## 2017-03-15 ENCOUNTER — Encounter (HOSPITAL_COMMUNITY)
Admission: RE | Admit: 2017-03-15 | Discharge: 2017-03-15 | Disposition: A | Discharge status: Home / Self Care | Payer: Medicare Other | Source: Ambulatory Visit | Attending: Nephrology | Admitting: Nephrology

## 2017-03-15 VITALS — BP 104/52 | HR 71 | Temp 98.1°F | Resp 20

## 2017-03-15 DIAGNOSIS — D631 Anemia in chronic kidney disease: Secondary | ICD-10-CM | POA: Insufficient documentation

## 2017-03-15 DIAGNOSIS — N183 Chronic kidney disease, stage 3 unspecified: Secondary | ICD-10-CM

## 2017-03-15 LAB — IRON AND TIBC
IRON: 72 ug/dL (ref 28–170)
SATURATION RATIOS: 38 % — AB (ref 10.4–31.8)
TIBC: 189 ug/dL — AB (ref 250–450)
UIBC: 117 ug/dL

## 2017-03-15 LAB — FERRITIN: FERRITIN: 1176 ng/mL — AB (ref 11–307)

## 2017-03-15 LAB — POCT HEMOGLOBIN-HEMACUE: HEMOGLOBIN: 11.3 g/dL — AB (ref 12.0–15.0)

## 2017-03-15 MED ORDER — EPOETIN ALFA 10000 UNIT/ML IJ SOLN
INTRAMUSCULAR | Status: AC
  Filled 2017-03-15: qty 1

## 2017-03-15 MED ORDER — EPOETIN ALFA 10000 UNIT/ML IJ SOLN
10000.0000 [IU] | INTRAMUSCULAR | Status: DC
  Administered 2017-03-15: 10000 [IU] via SUBCUTANEOUS

## 2017-03-31 ENCOUNTER — Other Ambulatory Visit: Payer: Self-pay | Admitting: Endocrinology

## 2017-04-10 ENCOUNTER — Encounter (HOSPITAL_COMMUNITY): Payer: Self-pay | Admitting: Family Medicine

## 2017-04-10 ENCOUNTER — Ambulatory Visit (HOSPITAL_COMMUNITY)
Admission: EM | Admit: 2017-04-10 | Discharge: 2017-04-10 | Disposition: A | Discharge status: Home / Self Care | Payer: Medicare Other | Attending: Family Medicine | Admitting: Family Medicine

## 2017-04-10 ENCOUNTER — Other Ambulatory Visit: Payer: Self-pay

## 2017-04-10 DIAGNOSIS — D649 Anemia, unspecified: Secondary | ICD-10-CM | POA: Diagnosis not present

## 2017-04-10 DIAGNOSIS — R42 Dizziness and giddiness: Secondary | ICD-10-CM | POA: Diagnosis not present

## 2017-04-10 LAB — POCT URINALYSIS DIP (DEVICE)
BILIRUBIN URINE: NEGATIVE
Glucose, UA: NEGATIVE mg/dL
HGB URINE DIPSTICK: NEGATIVE
KETONES UR: NEGATIVE mg/dL
Leukocytes, UA: NEGATIVE
Nitrite: NEGATIVE
PH: 6 (ref 5.0–8.0)
PROTEIN: NEGATIVE mg/dL
SPECIFIC GRAVITY, URINE: 1.015 (ref 1.005–1.030)
Urobilinogen, UA: 0.2 mg/dL (ref 0.0–1.0)

## 2017-04-10 LAB — POCT I-STAT, CHEM 8
BUN: 42 mg/dL — AB (ref 6–20)
CALCIUM ION: 1.31 mmol/L (ref 1.15–1.40)
CHLORIDE: 99 mmol/L — AB (ref 101–111)
CREATININE: 2.1 mg/dL — AB (ref 0.44–1.00)
GLUCOSE: 147 mg/dL — AB (ref 65–99)
HCT: 38 % (ref 36.0–46.0)
Hemoglobin: 12.9 g/dL (ref 12.0–15.0)
Potassium: 4.4 mmol/L (ref 3.5–5.1)
SODIUM: 136 mmol/L (ref 135–145)
TCO2: 28 mmol/L (ref 22–32)

## 2017-04-10 NOTE — ED Triage Notes (Addendum)
Pt presents today with dizziness, headache, weak, states her head has been feeling heavy for about 3-4 days. Also states she has had some pain in both of her sides on and off has had some dysuria. States she has taken 2 tablets of left over antibiotics that she does not know the name of that has not helped.

## 2017-04-10 NOTE — ED Provider Notes (Signed)
[facility]   [id number] 04/10/17 Arrival Time: 1930   SUBJECTIVE:  Stacy Walls is a 83 y.o. female who presents to the urgent care with complaint of dizziness, headache, weak, states her head has been feeling heavy for about 3-4 days. Also states she has had some pain in both of her sides on and off has had some dysuria. States she has taken 2 tablets of left over antibiotics that she does not know the name of that has not helped.    Past Medical History:  Diagnosis Date  . Acute cholecystitis   . Allergic rhinitis   . Anemia   . Chest pain   . Chronic renal insufficiency   . Clostridium difficile colitis   . Coronary atherosclerosis of native coronary vessel   . Cough   . Diabetes mellitus   . Dizziness   . DM2 (diabetes mellitus, type 2) ([place])   . Dyshidrosis   . GERD (gastroesophageal reflux disease)   . Gout   . HLD (hyperlipidemia)   . HTN (hypertension)   . Hypothyroidism   . Neck mass   . Neck pain   . Osteoporosis   . Routine general medical examination at a health care facility   . Secondary hyperparathyroidism (of renal origin)   . Urinary incontinence   . Urinary tract infection    Family History  Problem Relation Age of Onset  . Coronary artery disease Father   . Stroke Father   . Diabetes Father   . Diabetes Sister   . Stroke Sister   . Diabetes Brother    Social History   Socioeconomic History  . Marital status: Married    Spouse name: Not on file  . Number of children: Not on file  . Years of education: Not on file  . Highest education level: Not on file  Social Needs  . Financial resource strain: Not on file  . Food insecurity - worry: Not on file  . Food insecurity - inability: Not on file  . Transportation needs - medical: Not on file  . Transportation needs - non-medical: Not on file  Occupational History  . Not on file  Tobacco Use  . Smoking status: Never Smoker  . Smokeless tobacco: Never Used  Substance and  Sexual Activity  . Alcohol use: No  . Drug use: No  . Sexual activity: Not on file  Other Topics Concern  . Not on file  Social History Narrative   Lives with husband and has lived in [place] for over 30 years.    Current Meds  Medication Sig  . allopurinol (ZYLOPRIM) 300 MG tablet TAKE 1 TABLET BY MOUTH EVERY DAY  . allopurinol (ZYLOPRIM) 300 MG tablet TAKE 1 TABLET BY MOUTH EVERY DAY  . aspirin (ECOTRIN LOW STRENGTH) 81 MG EC tablet Take 81 mg by mouth daily.    . diclofenac sodium (VOLTAREN) 1 % GEL Apply 2 g topically 4 (four) times daily.  Marland Kitchen epoetin alfa (PROCRIT) 58527 UNIT/ML injection Inject 10,000 Units into the skin every 30 (thirty) days. Dr. Justin Mend  . Ferrous Sulfate (IRON) 325 (65 FE) MG TABS Take 1 tablet by mouth daily.   . furosemide (LASIX) 20 MG tablet TAKE 1 TABLET(20 MG) BY MOUTH DAILY  . levothyroxine (SYNTHROID, LEVOTHROID) 50 MCG tablet TAKE 1 TABLET BY MOUTH DAILY BEFORE BREAKFAST  . losartan-hydrochlorothiazide (HYZAAR) 100-25 MG tablet TAKE 1 TABLET BY MOUTH DAILY  . omeprazole (PRILOSEC) 20 MG capsule TAKE 1 CAPSULE BY MOUTH  EVERY DAY  . oxybutynin (DITROPAN) 5 MG tablet TAKE 1 TABLET(5 MG) BY MOUTH DAILY  . sitaGLIPtin (JANUVIA) 100 MG tablet TAKE 1/2 TABLET(50 MG) BY MOUTH DAILY   Allergies  Allergen Reactions  . Azithromycin   . Pioglitazone     REACTION: edema  . Sulfonamide Derivatives     REACTION: rash  . Tramadol     dizziness      ROS: As per HPI, remainder of ROS negative.   OBJECTIVE:   Vitals:   04/10/17 1947  BP: 134/66  Pulse: 71  Resp: 18  Temp: 98.1 F (36.7 C)  TempSrc: Oral  SpO2: 95%     General appearance: alert; no distress Eyes: PERRL; EOMI; conjunctiva normal HENT: normocephalic; atraumatic; TMs bilateral serous otitis changes without erythema, canal normal, external ears normal without trauma; nasal mucosa normal; oral mucosa normal Neck: supple Lungs: clear to auscultation bilaterally Heart: regular rate  and rhythm Abdomen: soft, non-tender; bowel sounds normal; no masses or organomegaly; no guarding or rebound tenderness Back: no CVA tenderness Extremities: no cyanosis or edema; symmetrical with no gross deformities Skin: warm and dry Neurologic: normal gait; grossly normal Psychological: alert and cooperative; normal mood and affect      Labs:  Results for orders placed or performed during the hospital encounter of 04/10/17  POCT urinalysis dip (device)  Result Value Ref Range   Glucose, UA NEGATIVE NEGATIVE mg/dL   Bilirubin Urine NEGATIVE NEGATIVE   Ketones, ur NEGATIVE NEGATIVE mg/dL   Specific Gravity, Urine 1.015 1.005 - 1.030   Hgb urine dipstick NEGATIVE NEGATIVE   pH 6.0 5.0 - 8.0   Protein, ur NEGATIVE NEGATIVE mg/dL   Urobilinogen, UA 0.2 0.0 - 1.0 mg/dL   Nitrite NEGATIVE NEGATIVE   Leukocytes, UA NEGATIVE NEGATIVE    Labs Reviewed  POCT URINALYSIS DIP (DEVICE)  POCT I-STAT, CHEM 8    No results found.     ASSESSMENT & PLAN:  1. Vertigo   2. Anemia, unspecified type     No orders of the defined types were placed in this encounter.   Reviewed expectations re: course of current medical issues. Questions answered. Outlined signs and symptoms indicating need for more acute intervention. Patient verbalized understanding. After Visit Summary given.    Procedures:      Robyn Haber, MD 04/10/17 2040

## 2017-04-10 NOTE — Discharge Instructions (Signed)
The urine is normal.  There is no sign of a urinary infection.  You do have a mild anemia which often causes some lightheadedness when standing up quickly.    Recommend starting a multiple vitamin with iron and following up with Dr. Loanne Drilling in a week or so

## 2017-04-12 ENCOUNTER — Encounter (HOSPITAL_COMMUNITY): Payer: Medicare Other

## 2017-04-15 ENCOUNTER — Encounter (HOSPITAL_COMMUNITY)
Admission: RE | Admit: 2017-04-15 | Discharge: 2017-04-15 | Disposition: A | Discharge status: Home / Self Care | Payer: Medicare Other | Source: Ambulatory Visit | Attending: Nephrology | Admitting: Nephrology

## 2017-04-15 VITALS — BP 116/57 | HR 74 | Temp 98.5°F | Resp 20

## 2017-04-15 DIAGNOSIS — N183 Chronic kidney disease, stage 3 unspecified: Secondary | ICD-10-CM

## 2017-04-15 DIAGNOSIS — D631 Anemia in chronic kidney disease: Secondary | ICD-10-CM | POA: Insufficient documentation

## 2017-04-15 LAB — POCT HEMOGLOBIN-HEMACUE: Hemoglobin: 11.3 g/dL — ABNORMAL LOW (ref 12.0–15.0)

## 2017-04-15 LAB — IRON AND TIBC
IRON: 65 ug/dL (ref 28–170)
SATURATION RATIOS: 33 % — AB (ref 10.4–31.8)
TIBC: 199 ug/dL — AB (ref 250–450)
UIBC: 134 ug/dL

## 2017-04-15 LAB — FERRITIN: FERRITIN: 1067 ng/mL — AB (ref 11–307)

## 2017-04-15 MED ORDER — EPOETIN ALFA 10000 UNIT/ML IJ SOLN
10000.0000 [IU] | INTRAMUSCULAR | Status: DC
  Administered 2017-04-15: 10000 [IU] via SUBCUTANEOUS

## 2017-04-15 MED ORDER — EPOETIN ALFA 10000 UNIT/ML IJ SOLN
INTRAMUSCULAR | Status: AC
  Administered 2017-04-15: 10000 [IU] via SUBCUTANEOUS
  Filled 2017-04-15: qty 1

## 2017-04-19 ENCOUNTER — Other Ambulatory Visit: Payer: Self-pay | Admitting: Endocrinology

## 2017-04-23 ENCOUNTER — Other Ambulatory Visit: Payer: Self-pay | Admitting: Endocrinology

## 2017-05-05 ENCOUNTER — Encounter: Payer: Self-pay | Admitting: Endocrinology

## 2017-05-05 ENCOUNTER — Ambulatory Visit (INDEPENDENT_AMBULATORY_CARE_PROVIDER_SITE_OTHER): Payer: Medicare Other | Admitting: Endocrinology

## 2017-05-05 VITALS — BP 128/55 | HR 62 | Wt 245.4 lb

## 2017-05-05 DIAGNOSIS — Z23 Encounter for immunization: Secondary | ICD-10-CM

## 2017-05-05 DIAGNOSIS — Z0000 Encounter for general adult medical examination without abnormal findings: Secondary | ICD-10-CM

## 2017-05-05 DIAGNOSIS — N183 Chronic kidney disease, stage 3 unspecified: Secondary | ICD-10-CM

## 2017-05-05 DIAGNOSIS — M810 Age-related osteoporosis without current pathological fracture: Secondary | ICD-10-CM

## 2017-05-05 DIAGNOSIS — R51 Headache: Secondary | ICD-10-CM | POA: Diagnosis not present

## 2017-05-05 DIAGNOSIS — E1122 Type 2 diabetes mellitus with diabetic chronic kidney disease: Secondary | ICD-10-CM | POA: Diagnosis not present

## 2017-05-05 DIAGNOSIS — M1A9XX0 Chronic gout, unspecified, without tophus (tophi): Secondary | ICD-10-CM

## 2017-05-05 DIAGNOSIS — R519 Headache, unspecified: Secondary | ICD-10-CM

## 2017-05-05 LAB — POCT GLYCOSYLATED HEMOGLOBIN (HGB A1C): Hemoglobin A1C: 6.5

## 2017-05-05 MED ORDER — CEPHALEXIN 250 MG PO CAPS: 250.0000 mg | ORAL_CAPSULE | Freq: Four times a day (QID) | ORAL | 0 refills | Status: DC

## 2017-05-05 NOTE — Progress Notes (Signed)
we discussed code status.  pt requests full code, but would not want to be started or maintained on artificial life-support measures if there was not a reasonable chance of recovery 

## 2017-05-05 NOTE — Patient Instructions (Addendum)
Please call to schedule your mammogram blood tests are requested for you today.  We'll let you know about the results. Please consider these measures for your health:  minimize alcohol.  Do not use tobacco products.  Have a colonoscopy at least every 10 years from age 82.  Women should have an annual mammogram from age 10.  Keep firearms safely stored.  Always use seat belts.  have working smoke alarms in your home.  See an eye doctor and dentist regularly.  Never drive under the influence of alcohol or drugs (including prescription drugs).  Those with fair skin should take precautions against the sun, and should carefully examine their skin once per month, for any new or changed moles. It is critically important to prevent falling down (keep floor areas well-lit, dry, and free of loose objects.  If you have a cane, walker, or wheelchair, you should use it, even for short trips around the house.  Wear flat-soled shoes.  Also, try not to rush).   good diet and exercise significantly improve the control of your diabetes.  please let me know if you wish to be referred to a dietician.  high blood sugar is very risky to your health.  you should see an eye doctor and dentist every year.  It is very important to get all recommended vaccinations.  I have sent a prescription to your pharmacy, for the antibiotic pill.   Please see a specialist for the headache.  you will receive a phone call, about a day and time for an appointment.    Call if you want to see a specialist for your shoulder, or to have a x-ray. Please come back for a follow-up appointment in 6 months.

## 2017-05-05 NOTE — Progress Notes (Signed)
Subjective:    Patient ID: Stacy Walls, female    DOB: 31-Dec-1932, 84 y.o.   MRN: [id number]  HPI  The state of at least three ongoing medical problems is addressed today, with interval history of each noted here: HTN: he denies sob Dyslipidemia: he denies chest pain DM: he denies weight change.  Past Medical History:  Diagnosis Date  . Acute cholecystitis   . Allergic rhinitis   . Anemia   . Chest pain   . Chronic renal insufficiency   . Clostridium difficile colitis   . Coronary atherosclerosis of native coronary vessel   . Cough   . Diabetes mellitus   . Dizziness   . DM2 (diabetes mellitus, type 2) ([place])   . Dyshidrosis   . GERD (gastroesophageal reflux disease)   . Gout   . HLD (hyperlipidemia)   . HTN (hypertension)   . Hypothyroidism   . Neck mass   . Neck pain   . Osteoporosis   . Routine general medical examination at a health care facility   . Secondary hyperparathyroidism (of renal origin)   . Urinary incontinence   . Urinary tract infection     Past Surgical History:  Procedure Laterality Date  . bone density  08/21/05  . C-section (other)  1977  . CHOLECYSTECTOMY    . ELECTROCARDIOGRAM  02/01/06  . L ankle surgery  1988  . total left knee  2007    Social History   Socioeconomic History  . Marital status: Married    Spouse name: Not on file  . Number of children: Not on file  . Years of education: Not on file  . Highest education level: Not on file  Social Needs  . Financial resource strain: Not on file  . Food insecurity - worry: Not on file  . Food insecurity - inability: Not on file  . Transportation needs - medical: Not on file  . Transportation needs - non-medical: Not on file  Occupational History  . Not on file  Tobacco Use  . Smoking status: Never Smoker  . Smokeless tobacco: Never Used  Substance and Sexual Activity  . Alcohol use: No  . Drug use: No  . Sexual activity: Not on file  Other Topics Concern  . Not on file    Social History Narrative   Lives with husband and has lived in [place] for over 30 years.     Current Outpatient Medications on File Prior to Visit  Medication Sig Dispense Refill  . allopurinol (ZYLOPRIM) 300 MG tablet TAKE 1 TABLET BY MOUTH EVERY DAY 90 tablet 0  . aspirin (ECOTRIN LOW STRENGTH) 81 MG EC tablet Take 81 mg by mouth daily.      . diclofenac sodium (VOLTAREN) 1 % GEL Apply 2 g topically 4 (four) times daily. 100 g 5  . epoetin alfa (PROCRIT) 41937 UNIT/ML injection Inject 10,000 Units into the skin every 30 (thirty) days. Dr. Justin Mend    . Ferrous Sulfate (IRON) 325 (65 FE) MG TABS Take 1 tablet by mouth daily.     . furosemide (LASIX) 20 MG tablet TAKE 1 TABLET(20 MG) BY MOUTH DAILY 90 tablet 0  . levothyroxine (SYNTHROID, LEVOTHROID) 50 MCG tablet TAKE 1 TABLET BY MOUTH DAILY BEFORE BREAKFAST 90 tablet 0  . losartan-hydrochlorothiazide (HYZAAR) 100-25 MG tablet TAKE 1 TABLET BY MOUTH DAILY 90 tablet 0  . magnesium hydroxide (MILK OF MAGNESIA) 400 MG/5ML suspension Take by mouth daily as needed for mild constipation.    Marland Kitchen  Multiple Vitamins-Minerals (MULTIVITAMIN WOMEN 50+ PO) Take by mouth.    Marland Kitchen omeprazole (PRILOSEC) 20 MG capsule TAKE 1 CAPSULE BY MOUTH EVERY DAY 90 capsule 0  . oxybutynin (DITROPAN) 5 MG tablet TAKE 1 TABLET(5 MG) BY MOUTH DAILY 90 tablet 0  . sitaGLIPtin (JANUVIA) 100 MG tablet TAKE 1/2 TABLET(50 MG) BY MOUTH DAILY 45 tablet 3  . triamcinolone ointment (KENALOG) 0.1 % APPLY TOPICALLY TO THE AFFECTED AREA THREE TIMES DAILY AS NEEDED FOR ITCHING 454 g 0  . atorvastatin (LIPITOR) 80 MG tablet Take 1 tablet (80 mg total) by mouth daily. (Patient not taking: Reported on 05/05/2017) 90 tablet 3   No current facility-administered medications on file prior to visit.     Allergies  Allergen Reactions  . Azithromycin   . Pioglitazone     REACTION: edema  . Sulfonamide Derivatives     REACTION: rash  . Tramadol     dizziness    Family History  Problem  Relation Age of Onset  . Coronary artery disease Father   . Stroke Father   . Diabetes Father   . Diabetes Sister   . Stroke Sister   . Diabetes Brother     BP (!) 128/55 (BP Location: Left Wrist, Patient Position: Sitting, Cuff Size: Normal)   Pulse 62   Wt 245 lb 6.4 oz (111.3 kg)   SpO2 96%   BMI 44.88 kg/m    Review of Systems Denies leg swelling, fever, and diarrhea.  She has slight chronic intermitt pain at the left scapular area.      Objective:   Physical Exam VITAL SIGNS:  See vs page.  GENERAL: no distress.  Pulses: dorsalis pedis intact bilat.   MSK: no deformity of the feet CV: trace bilat leg edema Skin:  no ulcer on the feet.  normal color and temp on the feet.  There is a small pustule the the right inguinal area Neuro: sensation is intact to touch on the feet.   Lab Results  Component Value Date   HGBA1C 6.5 05/05/2017      Assessment & Plan:  HTN: well-controlled.  Please continue the same medication Dyslipidemia: due for recheck.  blood tests are requested for you today.  We'll let you know about the results. Type 2 DM: well-controlled.  Please continue the same medication Pustule: new.  I rx'ed keflex Scapular pain: I offered x ray and/or ref sports medicine.   Subjective:   Patient here for Medicare annual wellness visit and management of other chronic and acute problems.     Risk factors: advanced age    58 of Physicians Providing Medical Care to Patient:  See "snapshot"   Activities of Daily Living: In your present state of health, do you have any difficulty performing the following activities (lives with husband)?:  Preparing food and eating?: No  Bathing yourself: No  Getting dressed: No  Using the toilet:No  Moving around from place to place: No  In the past year have you fallen or had a near fall?: No    Home Safety: Has smoke detector and wears seat belts. No firearms.    Opioid Use: none   Diet and Exercise  Current  exercise habits: pt says good--uses a cane Dietary issues discussed: pt reports a healthy diet   Depression Screen  Q1: Over the past two weeks, have you felt down, depressed or hopeless?no  Q2: Over the past two weeks, have you felt little interest or pleasure in doing things?  no   The following portions of the patient's history were reviewed and updated as appropriate: allergies, current medications, past family history, past medical history, past social history, past surgical history and problem list.   Review of Systems  Denies hearing loss, and visual loss Objective:   Vision:  Advertising account executive, so he declines VA today.  Hearing: grossly normal Body mass index:  See vs page Msk: pt easily and quickly performs "get-up-and-go" from a sitting position Cognitive Impairment Assessment: cognition, memory and judgment appear normal.  remembers 1/3 at 5 minutes (? effort).  excellent recall.  can easily read and write a sentence.  alert and oriented x 3.    Assessment:   Medicare wellness utd on preventive parameters    Plan:   During the course of the visit the patient was educated and counseled about appropriate screening and preventive services including:        Fall prevention is advised today  Screening mammography is advised Bone densitometry screening is ordered Diabetes screening is not needed, as pt has DM Nutrition counseling is offered  advanced directives/end of life addressed today:  see healthcare directives hyperlink  Vaccines are updated as needed  Patient Instructions (the written plan) was given to the patient.

## 2017-05-12 ENCOUNTER — Other Ambulatory Visit (HOSPITAL_COMMUNITY): Payer: Self-pay | Admitting: *Deleted

## 2017-05-12 ENCOUNTER — Other Ambulatory Visit: Payer: Medicare Other

## 2017-05-13 ENCOUNTER — Ambulatory Visit (HOSPITAL_COMMUNITY)
Admission: RE | Admit: 2017-05-13 | Discharge: 2017-05-13 | Disposition: A | Discharge status: Home / Self Care | Payer: Medicare Other | Source: Ambulatory Visit | Attending: Nephrology | Admitting: Nephrology

## 2017-05-13 VITALS — BP 123/48 | HR 64 | Resp 18

## 2017-05-13 DIAGNOSIS — N183 Chronic kidney disease, stage 3 unspecified: Secondary | ICD-10-CM

## 2017-05-13 DIAGNOSIS — D631 Anemia in chronic kidney disease: Secondary | ICD-10-CM | POA: Diagnosis not present

## 2017-05-13 LAB — IRON AND TIBC
Iron: 37 ug/dL (ref 28–170)
Saturation Ratios: 21 % (ref 10.4–31.8)
TIBC: 176 ug/dL — ABNORMAL LOW (ref 250–450)
UIBC: 139 ug/dL

## 2017-05-13 LAB — POCT HEMOGLOBIN-HEMACUE: HEMOGLOBIN: 11.3 g/dL — AB (ref 12.0–15.0)

## 2017-05-13 LAB — FERRITIN: Ferritin: 1046 ng/mL — ABNORMAL HIGH (ref 11–307)

## 2017-05-13 MED ORDER — EPOETIN ALFA 10000 UNIT/ML IJ SOLN
10000.0000 [IU] | INTRAMUSCULAR | Status: DC
  Administered 2017-05-13: 10000 [IU] via SUBCUTANEOUS

## 2017-05-13 MED ORDER — EPOETIN ALFA 10000 UNIT/ML IJ SOLN
INTRAMUSCULAR | Status: AC
  Administered 2017-05-13: 10000 [IU] via SUBCUTANEOUS
  Filled 2017-05-13: qty 1

## 2017-05-16 ENCOUNTER — Other Ambulatory Visit: Payer: Self-pay | Admitting: Endocrinology

## 2017-05-17 ENCOUNTER — Ambulatory Visit: Payer: Medicare Other | Admitting: Endocrinology

## 2017-05-18 ENCOUNTER — Other Ambulatory Visit: Payer: Self-pay | Admitting: Endocrinology

## 2017-05-19 ENCOUNTER — Other Ambulatory Visit: Payer: Self-pay

## 2017-05-19 MED ORDER — LOSARTAN POTASSIUM-HCTZ 100-25 MG PO TABS: 1.0000 | ORAL_TABLET | Freq: Every day | ORAL | 1 refills | Status: DC

## 2017-06-01 ENCOUNTER — Other Ambulatory Visit: Payer: Self-pay | Admitting: Endocrinology

## 2017-06-10 ENCOUNTER — Ambulatory Visit (HOSPITAL_COMMUNITY)
Admission: RE | Admit: 2017-06-10 | Discharge: 2017-06-10 | Disposition: A | Discharge status: Home / Self Care | Payer: Medicare Other | Source: Ambulatory Visit | Attending: Nephrology | Admitting: Nephrology

## 2017-06-10 VITALS — BP 115/61 | HR 62 | Temp 98.4°F | Resp 18

## 2017-06-10 DIAGNOSIS — D631 Anemia in chronic kidney disease: Secondary | ICD-10-CM | POA: Diagnosis not present

## 2017-06-10 DIAGNOSIS — N183 Chronic kidney disease, stage 3 unspecified: Secondary | ICD-10-CM

## 2017-06-10 LAB — POCT HEMOGLOBIN-HEMACUE: Hemoglobin: 11.9 g/dL — ABNORMAL LOW (ref 12.0–15.0)

## 2017-06-10 LAB — IRON AND TIBC
Iron: 76 ug/dL (ref 28–170)
Saturation Ratios: 40 % — ABNORMAL HIGH (ref 10.4–31.8)
TIBC: 190 ug/dL — AB (ref 250–450)
UIBC: 114 ug/dL

## 2017-06-10 LAB — FERRITIN: FERRITIN: 941 ng/mL — AB (ref 11–307)

## 2017-06-10 MED ORDER — EPOETIN ALFA 10000 UNIT/ML IJ SOLN
INTRAMUSCULAR | Status: AC
  Administered 2017-06-10: 10000 [IU]
  Filled 2017-06-10: qty 1

## 2017-06-10 MED ORDER — EPOETIN ALFA 10000 UNIT/ML IJ SOLN: 10000.0000 [IU] | INTRAMUSCULAR | Status: DC

## 2017-07-08 ENCOUNTER — Encounter (HOSPITAL_COMMUNITY)
Admission: RE | Admit: 2017-07-08 | Discharge: 2017-07-08 | Disposition: A | Discharge status: Home / Self Care | Payer: Medicare Other | Source: Ambulatory Visit | Attending: Nephrology | Admitting: Nephrology

## 2017-07-08 VITALS — BP 109/57 | HR 63 | Temp 98.4°F | Resp 20

## 2017-07-08 DIAGNOSIS — D631 Anemia in chronic kidney disease: Secondary | ICD-10-CM | POA: Diagnosis not present

## 2017-07-08 DIAGNOSIS — N183 Chronic kidney disease, stage 3 unspecified: Secondary | ICD-10-CM

## 2017-07-08 LAB — IRON AND TIBC
Iron: 65 ug/dL (ref 28–170)
Saturation Ratios: 36 % — ABNORMAL HIGH (ref 10.4–31.8)
TIBC: 181 ug/dL — ABNORMAL LOW (ref 250–450)
UIBC: 116 ug/dL

## 2017-07-08 LAB — FERRITIN: Ferritin: 1031 ng/mL — ABNORMAL HIGH (ref 11–307)

## 2017-07-08 LAB — POCT HEMOGLOBIN-HEMACUE: HEMOGLOBIN: 10.9 g/dL — AB (ref 12.0–15.0)

## 2017-07-08 MED ORDER — EPOETIN ALFA 10000 UNIT/ML IJ SOLN
10000.0000 [IU] | INTRAMUSCULAR | Status: DC
  Administered 2017-07-08: 10000 [IU] via SUBCUTANEOUS

## 2017-07-08 MED ORDER — EPOETIN ALFA 10000 UNIT/ML IJ SOLN
INTRAMUSCULAR | Status: AC
  Administered 2017-07-08: 13:00:00 10000 [IU] via SUBCUTANEOUS
  Filled 2017-07-08: qty 1

## 2017-07-15 ENCOUNTER — Telehealth: Payer: Self-pay | Admitting: Endocrinology

## 2017-07-15 ENCOUNTER — Other Ambulatory Visit: Payer: Self-pay | Admitting: Endocrinology

## 2017-07-15 DIAGNOSIS — Z1231 Encounter for screening mammogram for malignant neoplasm of breast: Secondary | ICD-10-CM

## 2017-07-15 DIAGNOSIS — Z0000 Encounter for general adult medical examination without abnormal findings: Secondary | ICD-10-CM

## 2017-07-15 NOTE — Telephone Encounter (Signed)
I assume this is for screening mammogram, which I have ordered

## 2017-07-15 NOTE — Telephone Encounter (Addendum)
Patient need Dr Loanne Drilling to put in a referral for her at the breast center.   Call Clarise Cruz [phone] Please review

## 2017-07-16 NOTE — Telephone Encounter (Signed)
I need to know which side

## 2017-07-16 NOTE — Telephone Encounter (Signed)
Please advise ov 

## 2017-07-16 NOTE — Telephone Encounter (Signed)
I called back [facility] & patient did not state what side she was having pain. Just that she was in pain & had a lump. I tried to call patient but received no answer.

## 2017-07-16 NOTE — Telephone Encounter (Signed)
No, when I called [facility] they stated patient is having pain so it needs to be for a diagnostic mammogram.

## 2017-07-17 DIAGNOSIS — M1711 Unilateral primary osteoarthritis, right knee: Secondary | ICD-10-CM | POA: Diagnosis not present

## 2017-07-22 ENCOUNTER — Other Ambulatory Visit: Payer: Self-pay | Admitting: Endocrinology

## 2017-07-26 NOTE — Telephone Encounter (Signed)
I have made patient appointment for 4/11 to come in & discuss.

## 2017-07-27 ENCOUNTER — Ambulatory Visit (INDEPENDENT_AMBULATORY_CARE_PROVIDER_SITE_OTHER): Payer: Medicare Other | Admitting: Endocrinology

## 2017-07-27 ENCOUNTER — Encounter: Payer: Self-pay | Admitting: Endocrinology

## 2017-07-27 VITALS — BP 110/78 | HR 68 | Temp 98.2°F | Wt 251.0 lb

## 2017-07-27 DIAGNOSIS — Z1231 Encounter for screening mammogram for malignant neoplasm of breast: Secondary | ICD-10-CM

## 2017-07-27 DIAGNOSIS — N183 Chronic kidney disease, stage 3 unspecified: Secondary | ICD-10-CM

## 2017-07-27 DIAGNOSIS — E1122 Type 2 diabetes mellitus with diabetic chronic kidney disease: Secondary | ICD-10-CM | POA: Diagnosis not present

## 2017-07-27 DIAGNOSIS — G8929 Other chronic pain: Secondary | ICD-10-CM

## 2017-07-27 DIAGNOSIS — M1A9XX0 Chronic gout, unspecified, without tophus (tophi): Secondary | ICD-10-CM | POA: Diagnosis not present

## 2017-07-27 DIAGNOSIS — E1129 Type 2 diabetes mellitus with other diabetic kidney complication: Secondary | ICD-10-CM | POA: Diagnosis not present

## 2017-07-27 DIAGNOSIS — Z1239 Encounter for other screening for malignant neoplasm of breast: Secondary | ICD-10-CM

## 2017-07-27 DIAGNOSIS — M25512 Pain in left shoulder: Secondary | ICD-10-CM | POA: Diagnosis not present

## 2017-07-27 DIAGNOSIS — R102 Pelvic and perineal pain: Secondary | ICD-10-CM | POA: Diagnosis not present

## 2017-07-27 LAB — URINALYSIS, ROUTINE W REFLEX MICROSCOPIC
BILIRUBIN URINE: NEGATIVE
Hgb urine dipstick: NEGATIVE
Ketones, ur: NEGATIVE
LEUKOCYTES UA: NEGATIVE
Nitrite: NEGATIVE
PH: 8 (ref 5.0–8.0)
RBC / HPF: NONE SEEN (ref 0–?)
SPECIFIC GRAVITY, URINE: 1.01 (ref 1.000–1.030)
TOTAL PROTEIN, URINE-UPE24: NEGATIVE
Urine Glucose: NEGATIVE
Urobilinogen, UA: 0.2 (ref 0.0–1.0)

## 2017-07-27 LAB — POCT GLYCOSYLATED HEMOGLOBIN (HGB A1C): HEMOGLOBIN A1C: 6.2

## 2017-07-27 NOTE — Patient Instructions (Addendum)
Let's check the mammogram and ultrasound.  you will receive a phone call, about a day and time for an appointment.   X-rays are requested for you today.  We'll let you know about the results. Please see a specialist.  you will receive a phone call, about a day and time for an appointment

## 2017-07-27 NOTE — Progress Notes (Signed)
Subjective:    Patient ID: Stacy Walls, female    DOB: 1932/12/06, 84 y.o.   MRN: [id number]  HPI Pt states few months of slight pain at the right breast, but no assoc swelling.  Past Medical History:  Diagnosis Date  . Acute cholecystitis   . Allergic rhinitis   . Anemia   . Chest pain   . Chronic renal insufficiency   . Clostridium difficile colitis   . Coronary atherosclerosis of native coronary vessel   . Cough   . Diabetes mellitus   . Dizziness   . DM2 (diabetes mellitus, type 2) ([place])   . Dyshidrosis   . GERD (gastroesophageal reflux disease)   . Gout   . HLD (hyperlipidemia)   . HTN (hypertension)   . Hypothyroidism   . Neck mass   . Neck pain   . Osteoporosis   . Routine general medical examination at a health care facility   . Secondary hyperparathyroidism (of renal origin)   . Urinary incontinence   . Urinary tract infection     Past Surgical History:  Procedure Laterality Date  . bone density  08/21/05  . C-section (other)  1977  . CHOLECYSTECTOMY    . ELECTROCARDIOGRAM  02/01/06  . L ankle surgery  1988  . total left knee  2007    Social History   Socioeconomic History  . Marital status: Married    Spouse name: Not on file  . Number of children: Not on file  . Years of education: Not on file  . Highest education level: Not on file  Occupational History  . Not on file  Social Needs  . Financial resource strain: Not on file  . Food insecurity:    Worry: Not on file    Inability: Not on file  . Transportation needs:    Medical: Not on file    Non-medical: Not on file  Tobacco Use  . Smoking status: Never Smoker  . Smokeless tobacco: Never Used  Substance and Sexual Activity  . Alcohol use: No  . Drug use: No  . Sexual activity: Not on file  Lifestyle  . Physical activity:    Days per week: Not on file    Minutes per session: Not on file  . Stress: Not on file  Relationships  . Social connections:    Talks on phone: Not on  file    Gets together: Not on file    Attends religious service: Not on file    Active member of club or organization: Not on file    Attends meetings of clubs or organizations: Not on file    Relationship status: Not on file  . Intimate partner violence:    Fear of current or ex partner: Not on file    Emotionally abused: Not on file    Physically abused: Not on file    Forced sexual activity: Not on file  Other Topics Concern  . Not on file  Social History Narrative   Lives with husband and has lived in [place] for over 30 years.     Current Outpatient Medications on File Prior to Visit  Medication Sig Dispense Refill  . allopurinol (ZYLOPRIM) 300 MG tablet TAKE 1 TABLET BY MOUTH EVERY DAY 90 tablet 0  . aspirin (ECOTRIN LOW STRENGTH) 81 MG EC tablet Take 81 mg by mouth daily.      . diclofenac sodium (VOLTAREN) 1 % GEL Apply 2 g topically 4 (four) times daily.  100 g 5  . epoetin alfa (PROCRIT) 71062 UNIT/ML injection Inject 10,000 Units into the skin every 30 (thirty) days. Dr. Justin Mend    . Ferrous Sulfate (IRON) 325 (65 FE) MG TABS Take 1 tablet by mouth daily.     . furosemide (LASIX) 20 MG tablet TAKE 1 TABLET(20 MG) BY MOUTH DAILY 90 tablet 0  . levothyroxine (SYNTHROID, LEVOTHROID) 50 MCG tablet TAKE 1 TABLET BY MOUTH DAILY BEFORE BREAKFAST 90 tablet 0  . losartan-hydrochlorothiazide (HYZAAR) 100-25 MG tablet TAKE 1 TABLET BY MOUTH DAILY 90 tablet 0  . losartan-hydrochlorothiazide (HYZAAR) 100-25 MG tablet Take 1 tablet by mouth daily. 90 tablet 1  . magnesium hydroxide (MILK OF MAGNESIA) 400 MG/5ML suspension Take by mouth daily as needed for mild constipation.    . Multiple Vitamins-Minerals (MULTIVITAMIN WOMEN 50+ PO) Take by mouth.    Marland Kitchen omeprazole (PRILOSEC) 20 MG capsule TAKE 1 CAPSULE BY MOUTH EVERY DAY 90 capsule 0  . omeprazole (PRILOSEC) 20 MG capsule TAKE 1 CAPSULE(20 MG) BY MOUTH DAILY 90 capsule 0  . oxybutynin (DITROPAN) 5 MG tablet TAKE 1 TABLET(5 MG) BY MOUTH  DAILY 90 tablet 0  . sitaGLIPtin (JANUVIA) 100 MG tablet TAKE 1/2 TABLET(50 MG) BY MOUTH DAILY 45 tablet 3  . triamcinolone ointment (KENALOG) 0.1 % APPLY TOPICALLY TO THE AFFECTED AREA THREE TIMES DAILY AS NEEDED FOR ITCHING 454 g 0  . atorvastatin (LIPITOR) 80 MG tablet Take 1 tablet (80 mg total) by mouth daily. (Patient not taking: Reported on 05/05/2017) 90 tablet 3   No current facility-administered medications on file prior to visit.     Allergies  Allergen Reactions  . Azithromycin   . Pioglitazone     REACTION: edema  . Sulfonamide Derivatives     REACTION: rash  . Tramadol     dizziness    Family History  Problem Relation Age of Onset  . Coronary artery disease Father   . Stroke Father   . Diabetes Father   . Diabetes Sister   . Stroke Sister   . Diabetes Brother     BP 110/78 (BP Location: Right Arm, Patient Position: Sitting, Cuff Size: Large)   Pulse 68   Temp 98.2 F (36.8 C) (Oral)   Wt 251 lb (113.9 kg)   SpO2 97%   BMI 45.91 kg/m    Review of Systems She also has slight pain across the pelvis.  Finally, she has pain at the left scapular area.  No injury.      Objective:   Physical Exam VITAL SIGNS:  See vs page GENERAL: no distress Right breast: ? of swelling at 10 o'clock.   Left shoulder: full ROM, without pain  Lab Results  Component Value Date   HGBA1C 6.2 07/27/2017       Assessment & Plan:  Breast swelling, new, uncertain etiology Scapular pain, uncertain etiology Pelvic pain: check UA Type 2 DM: well-controlled.    Patient Instructions  Let's check the mammogram and ultrasound.  you will receive a phone call, about a day and time for an appointment.   X-rays are requested for you today.  We'll let you know about the results. Please see a specialist.  you will receive a phone call, about a day and time for an appointment

## 2017-07-28 ENCOUNTER — Telehealth: Payer: Self-pay | Admitting: Endocrinology

## 2017-07-28 NOTE — Telephone Encounter (Signed)
Dr Loanne Drilling wanted patient to have a mammogram screening. When the office called to get her scheduled they asked a few questions and stated the order needed to be changed to a DX Screening, Once that has been changed they will call her to get her schedule for this,   Patient would like a call once the order has been changed,   Thanks!

## 2017-07-28 NOTE — Telephone Encounter (Signed)
I called and notified patient that screening mammogram was ordered.

## 2017-07-28 NOTE — Telephone Encounter (Signed)
I ordered screening yesterday.  Let's start there

## 2017-07-29 ENCOUNTER — Ambulatory Visit: Payer: Medicare Other | Admitting: Endocrinology

## 2017-07-31 NOTE — Progress Notes (Signed)
[facility] [address], [place] [address] Phone: [phone] Subjective:    I'm seeing this patient by the request  of:  Renato Shin, MD   CC: Left shoulder pain  [id number]  Stacy Walls is a 84 y.o. female coming in with complaint of left shoulder pain.  Past medical history significant for diabetes and gout. Her pain is in the left shoulder blade. Pain is "itchy" and there is no pattern to the pain. Pain has been occurring for 8 months.  Discussed icing regimen and home exercises.  Has not done so.  Patient states that it is very intermittent.  No association with activity.  It does not wake her up at night.  No association with eating.  No shortness of breath.     Past Medical History:  Diagnosis Date  . Acute cholecystitis   . Allergic rhinitis   . Anemia   . Chest pain   . Chronic renal insufficiency   . Clostridium difficile colitis   . Coronary atherosclerosis of native coronary vessel   . Cough   . Diabetes mellitus   . Dizziness   . DM2 (diabetes mellitus, type 2) ([place])   . Dyshidrosis   . GERD (gastroesophageal reflux disease)   . Gout   . HLD (hyperlipidemia)   . HTN (hypertension)   . Hypothyroidism   . Neck mass   . Neck pain   . Osteoporosis   . Routine general medical examination at a health care facility   . Secondary hyperparathyroidism (of renal origin)   . Urinary incontinence   . Urinary tract infection    Past Surgical History:  Procedure Laterality Date  . bone density  08/21/05  . C-section (other)  1977  . CHOLECYSTECTOMY    . ELECTROCARDIOGRAM  02/01/06  . L ankle surgery  1988  . total left knee  2007   Social History   Socioeconomic History  . Marital status: Married    Spouse name: Not on file  . Number of children: Not on file  . Years of education: Not on file  . Highest education level: Not on file  Occupational History  . Not on file  Social Needs  . Financial resource  strain: Not on file  . Food insecurity:    Worry: Not on file    Inability: Not on file  . Transportation needs:    Medical: Not on file    Non-medical: Not on file  Tobacco Use  . Smoking status: Never Smoker  . Smokeless tobacco: Never Used  Substance and Sexual Activity  . Alcohol use: No  . Drug use: No  . Sexual activity: Not on file  Lifestyle  . Physical activity:    Days per week: Not on file    Minutes per session: Not on file  . Stress: Not on file  Relationships  . Social connections:    Talks on phone: Not on file    Gets together: Not on file    Attends religious service: Not on file    Active member of club or organization: Not on file    Attends meetings of clubs or organizations: Not on file    Relationship status: Not on file  Other Topics Concern  . Not on file  Social History Narrative   Lives with husband and has lived in [place] for over 30 years.    Allergies  Allergen Reactions  . Azithromycin   .  Pioglitazone     REACTION: edema  . Sulfonamide Derivatives     REACTION: rash  . Tramadol     dizziness   Family History  Problem Relation Age of Onset  . Coronary artery disease Father   . Stroke Father   . Diabetes Father   . Diabetes Sister   . Stroke Sister   . Diabetes Brother      Past medical history, social, surgical and family history all reviewed in electronic medical record.  No pertanent information unless stated regarding to the chief complaint.   Review of Systems:Review of systems updated and as accurate as of 08/02/17  No headache, visual changes, nausea, vomiting, diarrhea, constipation, dizziness, abdominal pain, skin rash, fevers, chills, night sweats, weight loss, swollen lymph nodes, body aches, joint swelling, chest pain, shortness of breath, mood changes.  Positive muscle aches  Objective  Blood pressure 122/88, pulse 69, height 5\' 2"  (1.575 m), weight 241 lb (109.3 kg), SpO2 97 %. Systems examined below as of  08/02/17   General: No apparent distress alert and oriented x3 mood and affect normal, dressed appropriately.  HEENT: Pupils equal, extraocular movements intact  Respiratory: Patient's speak in full sentences and does not appear short of breath  Cardiovascular: No lower extremity edema, non tender, no erythema  Skin: Warm dry intact with no signs of infection or rash on extremities or on axial skeleton.  Abdomen: Soft nontender  Neuro: Cranial nerves II through XII are intact, neurovascularly intact in all extremities with 2+ DTRs and 2+ pulses.  Lymph: No lymphadenopathy of posterior or anterior cervical chain or axillae bilaterally.  Gait ambulates with the aid of a cane MSK:  Mild tender with limited range of motion and mild instability and symmetric strength and tone of  elbows, wrist, hip, knee and ankles bilaterally.     Arthritic changes of multiple joints Patient does have increasing kyphosis of the upper back.  Mild arthritic changes of the neck noted with decreasing range of motion and mild crepitus but negative Spurling.  Tenderness in the musculature around the medial border of the scapula more proximal than inferior.  No winging noted.  Mild trigger points noted.  [id number]; 15 additional minutes spent for Therapeutic exercises as stated in above notes.  This included exercises focusing on stretching, strengthening, with significant focus on eccentric aspects.   Long term goals include an improvement in range of motion, strength, endurance as well as avoiding reinjury. Patient's frequency would include in 1-2 times a day, 3-5 times a week for a duration of 6-12 weeks. Exercises that included:  Basic scapular stabilization to include adduction and depression of scapula Scaption, focusing on proper movement and good control Internal and External rotation utilizing a theraband, with elbow tucked at side entire time Rows with theraband   Proper technique shown and discussed handout in great  detail with ATC.  All questions were discussed and answered.      Impression and Recommendations:     This case required medical decision making of moderate complexity.      Note: This dictation was prepared with Dragon dictation along with smaller phrase technology. Any transcriptional errors that result from this process are unintentional.

## 2017-08-02 ENCOUNTER — Encounter: Payer: Self-pay | Admitting: Family Medicine

## 2017-08-02 ENCOUNTER — Ambulatory Visit (INDEPENDENT_AMBULATORY_CARE_PROVIDER_SITE_OTHER)
Admission: RE | Admit: 2017-08-02 | Discharge: 2017-08-02 | Disposition: A | Discharge status: Home / Self Care | Payer: Medicare Other | Source: Ambulatory Visit | Attending: Family Medicine | Admitting: Family Medicine

## 2017-08-02 ENCOUNTER — Ambulatory Visit (INDEPENDENT_AMBULATORY_CARE_PROVIDER_SITE_OTHER): Payer: Medicare Other | Admitting: Family Medicine

## 2017-08-02 VITALS — BP 122/88 | HR 69 | Ht 62.0 in | Wt 241.0 lb

## 2017-08-02 DIAGNOSIS — G8929 Other chronic pain: Secondary | ICD-10-CM

## 2017-08-02 DIAGNOSIS — M25512 Pain in left shoulder: Secondary | ICD-10-CM | POA: Diagnosis not present

## 2017-08-02 DIAGNOSIS — M898X1 Other specified disorders of bone, shoulder: Secondary | ICD-10-CM | POA: Diagnosis not present

## 2017-08-02 DIAGNOSIS — M19012 Primary osteoarthritis, left shoulder: Secondary | ICD-10-CM | POA: Diagnosis not present

## 2017-08-02 NOTE — Patient Instructions (Addendum)
Good to see you  Ice 20 minutes 2 times daily. Usually after activity and before bed. pennsaid pinkie amount topically 2 times daily as needed. Use only when needed though.  Exercises 3 times a week.  Add 500mg  of vitamin C to your iron supplement to help with absorption  Tart cherry extract 1200mg  at night may help with some pain and sleep  See me again in 3-4 weeks to make sure better If not we will need some labs

## 2017-08-02 NOTE — Assessment & Plan Note (Signed)
Patient does have scapular pain.  Long-standing history of thyroid disease as well as iron deficiency that could be contributing to more muscle imbalances of weakness.  Patient denies any association with eating, no chest pain with activity or any signs of stable or unstable angina.  Patient given the choice of potential trigger point injections which she declined today.  We discussed over-the-counter medications and given topical anti-inflammatories.  Encourage her not to take any oral anti-inflammatories secondary to history of chronic kidney disease.  Patient will try some home exercises as well.  Follow-up with me again in 4 weeks

## 2017-08-03 ENCOUNTER — Telehealth: Payer: Self-pay | Admitting: Endocrinology

## 2017-08-03 ENCOUNTER — Ambulatory Visit
Admission: RE | Admit: 2017-08-03 | Discharge: 2017-08-03 | Disposition: A | Discharge status: Home / Self Care | Payer: Medicare Other | Source: Ambulatory Visit | Attending: Endocrinology | Admitting: Endocrinology

## 2017-08-03 ENCOUNTER — Other Ambulatory Visit: Payer: Self-pay | Admitting: Endocrinology

## 2017-08-03 DIAGNOSIS — R935 Abnormal findings on diagnostic imaging of other abdominal regions, including retroperitoneum: Secondary | ICD-10-CM

## 2017-08-03 DIAGNOSIS — R102 Pelvic and perineal pain: Secondary | ICD-10-CM | POA: Diagnosis not present

## 2017-08-03 NOTE — Telephone Encounter (Signed)
-----   Message from Dorna Leitz, [place] sent at 07/30/2017  2:52 PM EDT ----- Regarding: Pain in patient's head Patient called & I spoke with her about head pain. She stated that she wanted an x-ray done. She said this wasn't a headache, but a pain she gets from time to time. I told her that it didn't sound like an x-ray is what she needed. Patient also said she discussed this with you when she came in for appointment this week. By then end of our conversation she asked for an antibiotic even though she didn't think that her pain was allergy related.

## 2017-08-03 NOTE — Telephone Encounter (Signed)
please call patient: We discussed a number of symptoms when you were here, but the head pain would be something we should discuss more.  Can you come back soon to address?  Thank you.

## 2017-08-04 NOTE — Telephone Encounter (Signed)
I called & patient stated that she would call to make appointment later on. She didn't feel that her condition was any type of emergency.

## 2017-08-05 ENCOUNTER — Ambulatory Visit (HOSPITAL_COMMUNITY)
Admission: RE | Admit: 2017-08-05 | Discharge: 2017-08-05 | Disposition: A | Discharge status: Home / Self Care | Payer: Medicare Other | Source: Ambulatory Visit | Attending: Nephrology | Admitting: Nephrology

## 2017-08-05 VITALS — BP 129/53 | HR 71 | Temp 98.5°F | Resp 20

## 2017-08-05 DIAGNOSIS — D631 Anemia in chronic kidney disease: Secondary | ICD-10-CM | POA: Insufficient documentation

## 2017-08-05 DIAGNOSIS — N183 Chronic kidney disease, stage 3 unspecified: Secondary | ICD-10-CM

## 2017-08-05 LAB — IRON AND TIBC
Iron: 65 ug/dL (ref 28–170)
Saturation Ratios: 32 % — ABNORMAL HIGH (ref 10.4–31.8)
TIBC: 200 ug/dL — ABNORMAL LOW (ref 250–450)
UIBC: 135 ug/dL

## 2017-08-05 LAB — FERRITIN: FERRITIN: 944 ng/mL — AB (ref 11–307)

## 2017-08-05 LAB — POCT HEMOGLOBIN-HEMACUE: HEMOGLOBIN: 11.8 g/dL — AB (ref 12.0–15.0)

## 2017-08-05 MED ORDER — EPOETIN ALFA 10000 UNIT/ML IJ SOLN
10000.0000 [IU] | INTRAMUSCULAR | Status: DC
  Administered 2017-08-05: 10000 [IU] via SUBCUTANEOUS

## 2017-08-05 MED ORDER — EPOETIN ALFA 10000 UNIT/ML IJ SOLN
INTRAMUSCULAR | Status: AC
  Administered 2017-08-05: 10000 [IU] via SUBCUTANEOUS
  Filled 2017-08-05: qty 1

## 2017-08-09 ENCOUNTER — Telehealth: Payer: Self-pay | Admitting: Endocrinology

## 2017-08-09 NOTE — Telephone Encounter (Signed)
Patient is calling back about having xray/notes sent over to Dr [facility] office. (gyn)  She does not have the fax number just the phone number for the office. [phone]

## 2017-08-10 NOTE — Telephone Encounter (Signed)
I have sent to Dr. Marijean Heath office at [facility].

## 2017-08-17 ENCOUNTER — Other Ambulatory Visit: Payer: Self-pay | Admitting: Endocrinology

## 2017-08-18 ENCOUNTER — Other Ambulatory Visit: Payer: Self-pay | Admitting: Endocrinology

## 2017-08-23 DIAGNOSIS — Z124 Encounter for screening for malignant neoplasm of cervix: Secondary | ICD-10-CM | POA: Diagnosis not present

## 2017-08-23 DIAGNOSIS — R9389 Abnormal findings on diagnostic imaging of other specified body structures: Secondary | ICD-10-CM | POA: Diagnosis not present

## 2017-08-24 DIAGNOSIS — Z124 Encounter for screening for malignant neoplasm of cervix: Secondary | ICD-10-CM | POA: Diagnosis not present

## 2017-08-27 DIAGNOSIS — M1711 Unilateral primary osteoarthritis, right knee: Secondary | ICD-10-CM | POA: Diagnosis not present

## 2017-08-30 NOTE — Progress Notes (Signed)
[facility] [place], [place] [address] Phone: [phone] Subjective:    I'm seeing this patient by the request  of:    CC: Left shoulder and scapular pain follow-up  [id number]  Stacy Walls is a 84 y.o. female coming in with complaint of left scapular and shoulder pain.  Was seen previously.  Patient states that the home exercise, topical anti-inflammatories and vitamins have some pain patient 100% better at this time.  Having very minimal pain.  Discussed icing regimen and home exercises.  Patient is doing very well overall.  Patient feels like she is back to herself.  Feels the vitamins have been very helpful.     Past Medical History:  Diagnosis Date  . Acute cholecystitis   . Allergic rhinitis   . Anemia   . Chest pain   . Chronic renal insufficiency   . Clostridium difficile colitis   . Coronary atherosclerosis of native coronary vessel   . Cough   . Diabetes mellitus   . Dizziness   . DM2 (diabetes mellitus, type 2) ([place])   . Dyshidrosis   . GERD (gastroesophageal reflux disease)   . Gout   . HLD (hyperlipidemia)   . HTN (hypertension)   . Hypothyroidism   . Neck mass   . Neck pain   . Osteoporosis   . Routine general medical examination at a health care facility   . Secondary hyperparathyroidism (of renal origin)   . Urinary incontinence   . Urinary tract infection    Past Surgical History:  Procedure Laterality Date  . bone density  08/21/05  . C-section (other)  1977  . CHOLECYSTECTOMY    . ELECTROCARDIOGRAM  02/01/06  . L ankle surgery  1988  . total left knee  2007   Social History   Socioeconomic History  . Marital status: Married    Spouse name: Not on file  . Number of children: Not on file  . Years of education: Not on file  . Highest education level: Not on file  Occupational History  . Not on file  Social Needs  . Financial resource strain: Not on file  . Food insecurity:    Worry:  Not on file    Inability: Not on file  . Transportation needs:    Medical: Not on file    Non-medical: Not on file  Tobacco Use  . Smoking status: Never Smoker  . Smokeless tobacco: Never Used  Substance and Sexual Activity  . Alcohol use: No  . Drug use: No  . Sexual activity: Not on file  Lifestyle  . Physical activity:    Days per week: Not on file    Minutes per session: Not on file  . Stress: Not on file  Relationships  . Social connections:    Talks on phone: Not on file    Gets together: Not on file    Attends religious service: Not on file    Active member of club or organization: Not on file    Attends meetings of clubs or organizations: Not on file    Relationship status: Not on file  Other Topics Concern  . Not on file  Social History Narrative   Lives with husband and has lived in [place] for over 30 years.    Allergies  Allergen Reactions  . Azithromycin   . Pioglitazone     REACTION: edema  . Sulfonamide Derivatives     REACTION: rash  .  Tramadol     dizziness   Family History  Problem Relation Age of Onset  . Coronary artery disease Father   . Stroke Father   . Diabetes Father   . Diabetes Sister   . Stroke Sister   . Diabetes Brother      Past medical history, social, surgical and family history all reviewed in electronic medical record.  No pertanent information unless stated regarding to the chief complaint.   Review of Systems:Review of systems updated and as accurate as of 08/31/17  No headache, visual changes, nausea, vomiting, diarrhea, constipation, dizziness, abdominal pain, skin rash, fevers, chills, night sweats, weight loss, swollen lymph nodes, body aches, joint swelling, muscle aches, chest pain, shortness of breath, mood changes.   Objective  Blood pressure 110/70, height 5\' 2"  (1.575 m), weight 251 lb (113.9 kg). Systems examined below as of 08/31/17   General: No apparent distress alert and oriented x3 mood and affect  normal, dressed appropriately.  HEENT: Pupils equal, extraocular movements intact  Respiratory: Patient's speak in full sentences and does not appear short of breath  Cardiovascular: No lower extremity edema, non tender, no erythema  Skin: Warm dry intact with no signs of infection or rash on extremities or on axial skeleton.  Abdomen: Soft nontender  Neuro: Cranial nerves II through XII are intact, neurovascularly intact in all extremities with 2+ DTRs and 2+ pulses.  Lymph: No lymphadenopathy of posterior or anterior cervical chain or axillae bilaterally.  Gait mild antalgic gait MSK:  Non tender with full range of motion and good stability and symmetric strength and tone of , elbows, wrist, hip, knee and ankles bilaterally.  Mild increase in kyphosis Shoulder:left Inspection reveals no abnormalities, atrophy or asymmetry. Palpation is normal with no tenderness over AC joint or bicipital groove.moderate OA of multiple joints.  ROM is full in all planes. Rotator cuff strength normal throughout. + impingement Speeds and Yergason's tests normal. + obrien  Mild scapula instability  No painful arc and no drop arm sign. No apprehension sign Contralateral shoulder unremarkable      Impression and Recommendations:     This case required medical decision making of moderate complexity.      Note: This dictation was prepared with Dragon dictation along with smaller phrase technology. Any transcriptional errors that result from this process are unintentional.

## 2017-08-31 ENCOUNTER — Encounter: Payer: Self-pay | Admitting: Family Medicine

## 2017-08-31 ENCOUNTER — Ambulatory Visit (INDEPENDENT_AMBULATORY_CARE_PROVIDER_SITE_OTHER): Payer: Medicare Other | Admitting: Family Medicine

## 2017-08-31 DIAGNOSIS — G8929 Other chronic pain: Secondary | ICD-10-CM | POA: Diagnosis not present

## 2017-08-31 DIAGNOSIS — M898X1 Other specified disorders of bone, shoulder: Secondary | ICD-10-CM

## 2017-08-31 NOTE — Patient Instructions (Signed)
Good to see you  Keep doing what you are doing pennsaid pinkie amount topically 2 times daily as needed.  If you need more call us Otherwise see me when you need me

## 2017-08-31 NOTE — Assessment & Plan Note (Signed)
Seems to be improved.  Discussed icing regimen Discussed HEP and vitamins As long as do well RTC as needed

## 2017-09-02 ENCOUNTER — Ambulatory Visit (HOSPITAL_COMMUNITY)
Admission: RE | Admit: 2017-09-02 | Discharge: 2017-09-02 | Disposition: A | Discharge status: Home / Self Care | Payer: Medicare Other | Source: Ambulatory Visit | Attending: Nephrology | Admitting: Nephrology

## 2017-09-02 VITALS — BP 111/65 | HR 71 | Temp 98.7°F

## 2017-09-02 DIAGNOSIS — N183 Chronic kidney disease, stage 3 unspecified: Secondary | ICD-10-CM

## 2017-09-02 DIAGNOSIS — D631 Anemia in chronic kidney disease: Secondary | ICD-10-CM | POA: Insufficient documentation

## 2017-09-02 DIAGNOSIS — Z79899 Other long term (current) drug therapy: Secondary | ICD-10-CM | POA: Insufficient documentation

## 2017-09-02 DIAGNOSIS — Z5181 Encounter for therapeutic drug level monitoring: Secondary | ICD-10-CM | POA: Diagnosis not present

## 2017-09-02 LAB — FERRITIN: Ferritin: 1067 ng/mL — ABNORMAL HIGH (ref 11–307)

## 2017-09-02 LAB — IRON AND TIBC
Iron: 70 ug/dL (ref 28–170)
Saturation Ratios: 40 % — ABNORMAL HIGH (ref 10.4–31.8)
TIBC: 175 ug/dL — ABNORMAL LOW (ref 250–450)
UIBC: 105 ug/dL

## 2017-09-02 LAB — POCT HEMOGLOBIN-HEMACUE: HEMOGLOBIN: 11.7 g/dL — AB (ref 12.0–15.0)

## 2017-09-02 MED ORDER — EPOETIN ALFA 10000 UNIT/ML IJ SOLN
10000.0000 [IU] | INTRAMUSCULAR | Status: DC
  Administered 2017-09-02: 10000 [IU] via SUBCUTANEOUS

## 2017-09-02 MED ORDER — EPOETIN ALFA 10000 UNIT/ML IJ SOLN
INTRAMUSCULAR | Status: AC
  Administered 2017-09-02: 10000 [IU] via SUBCUTANEOUS
  Filled 2017-09-02: qty 1

## 2017-09-03 ENCOUNTER — Other Ambulatory Visit: Payer: Self-pay | Admitting: Endocrinology

## 2017-09-04 NOTE — Telephone Encounter (Signed)
Forwarded

## 2017-09-07 ENCOUNTER — Ambulatory Visit (INDEPENDENT_AMBULATORY_CARE_PROVIDER_SITE_OTHER): Payer: Medicare Other | Admitting: Endocrinology

## 2017-09-07 ENCOUNTER — Other Ambulatory Visit: Payer: Self-pay | Admitting: Endocrinology

## 2017-09-07 ENCOUNTER — Encounter: Payer: Self-pay | Admitting: Endocrinology

## 2017-09-07 VITALS — BP 124/78 | HR 108 | Wt 244.8 lb

## 2017-09-07 DIAGNOSIS — E1122 Type 2 diabetes mellitus with diabetic chronic kidney disease: Secondary | ICD-10-CM | POA: Diagnosis not present

## 2017-09-07 DIAGNOSIS — N2581 Secondary hyperparathyroidism of renal origin: Secondary | ICD-10-CM

## 2017-09-07 DIAGNOSIS — N183 Chronic kidney disease, stage 3 unspecified: Secondary | ICD-10-CM

## 2017-09-07 DIAGNOSIS — R519 Headache, unspecified: Secondary | ICD-10-CM

## 2017-09-07 DIAGNOSIS — D509 Iron deficiency anemia, unspecified: Secondary | ICD-10-CM

## 2017-09-07 DIAGNOSIS — R51 Headache: Secondary | ICD-10-CM

## 2017-09-07 DIAGNOSIS — M1A9XX0 Chronic gout, unspecified, without tophus (tophi): Secondary | ICD-10-CM | POA: Diagnosis not present

## 2017-09-07 DIAGNOSIS — D61818 Other pancytopenia: Secondary | ICD-10-CM | POA: Diagnosis not present

## 2017-09-07 DIAGNOSIS — E039 Hypothyroidism, unspecified: Secondary | ICD-10-CM | POA: Diagnosis not present

## 2017-09-07 LAB — HEPATIC FUNCTION PANEL
ALT: 17 U/L (ref 0–35)
AST: 20 U/L (ref 0–37)
Albumin: 3.7 g/dL (ref 3.5–5.2)
Alkaline Phosphatase: 86 U/L (ref 39–117)
Bilirubin, Direct: 0.1 mg/dL (ref 0.0–0.3)
Total Bilirubin: 0.7 mg/dL (ref 0.2–1.2)
Total Protein: 6.9 g/dL (ref 6.0–8.3)

## 2017-09-07 LAB — CBC WITH DIFFERENTIAL/PLATELET
Basophils Absolute: 0 K/uL (ref 0.0–0.1)
Basophils Relative: 0.5 % (ref 0.0–3.0)
Eosinophils Absolute: 0.2 K/uL (ref 0.0–0.7)
Eosinophils Relative: 3.1 % (ref 0.0–5.0)
HCT: 37.7 % (ref 36.0–46.0)
Hemoglobin: 12.3 g/dL (ref 12.0–15.0)
Lymphocytes Relative: 23.9 % (ref 12.0–46.0)
Lymphs Abs: 1.9 K/uL (ref 0.7–4.0)
MCHC: 32.5 g/dL (ref 30.0–36.0)
MCV: 96.8 fl (ref 78.0–100.0)
Monocytes Absolute: 1 K/uL (ref 0.1–1.0)
Monocytes Relative: 12.3 % — ABNORMAL HIGH (ref 3.0–12.0)
Neutro Abs: 4.8 K/uL (ref 1.4–7.7)
Neutrophils Relative %: 60.2 % (ref 43.0–77.0)
Platelets: 159 K/uL (ref 150.0–400.0)
RBC: 3.9 Mil/uL (ref 3.87–5.11)
RDW: 13.9 % (ref 11.5–15.5)
WBC: 7.9 K/uL (ref 4.0–10.5)

## 2017-09-07 LAB — LIPID PANEL
CHOLESTEROL: 260 mg/dL — AB (ref 0–200)
HDL: 39.7 mg/dL (ref >39.00)
NonHDL: 220.71
TRIGLYCERIDES: 274 mg/dL — AB (ref 0.0–149.0)
Total CHOL/HDL Ratio: 7
VLDL: 54.8 mg/dL — ABNORMAL HIGH (ref 0.0–40.0)

## 2017-09-07 LAB — LDL CHOLESTEROL, DIRECT: Direct LDL: 159 mg/dL

## 2017-09-07 LAB — BASIC METABOLIC PANEL
BUN: 29 mg/dL — AB (ref 6–23)
CALCIUM: 10.7 mg/dL — AB (ref 8.4–10.5)
CO2: 32 mEq/L (ref 19–32)
Chloride: 98 mEq/L (ref 96–112)
Creatinine, Ser: 1.75 mg/dL — ABNORMAL HIGH (ref 0.40–1.20)
GFR: 35.75 mL/min — AB (ref >60.00)
Glucose, Bld: 165 mg/dL — ABNORMAL HIGH (ref 70–99)
POTASSIUM: 4.2 meq/L (ref 3.5–5.1)
Sodium: 136 mEq/L (ref 135–145)

## 2017-09-07 LAB — SEDIMENTATION RATE: Sed Rate: 47 mm/h — ABNORMAL HIGH (ref 0–30)

## 2017-09-07 LAB — MICROALBUMIN / CREATININE URINE RATIO
CREATININE, U: 136.1 mg/dL
MICROALB/CREAT RATIO: 1.7 mg/g (ref 0.0–30.0)
Microalb, Ur: 2.3 mg/dL — ABNORMAL HIGH (ref 0.0–1.9)

## 2017-09-07 LAB — IBC PANEL
IRON: 42 ug/dL (ref 42–145)
Saturation Ratios: 19.7 % — ABNORMAL LOW (ref 20.0–50.0)
TRANSFERRIN: 152 mg/dL — AB (ref 212.0–360.0)

## 2017-09-07 LAB — URIC ACID: Uric Acid, Serum: 5.8 mg/dL (ref 2.4–7.0)

## 2017-09-07 LAB — TSH: TSH: 10.32 u[IU]/mL — ABNORMAL HIGH (ref 0.35–4.50)

## 2017-09-07 LAB — VITAMIN D 25 HYDROXY (VIT D DEFICIENCY, FRACTURES): VITD: 29.23 ng/mL — ABNORMAL LOW (ref 30.00–100.00)

## 2017-09-07 MED ORDER — OXYBUTYNIN CHLORIDE 5 MG PO TABS: ORAL_TABLET | ORAL | 3 refills | Status: DC

## 2017-09-07 NOTE — Patient Instructions (Addendum)
blood tests are requested for you today.  We'll let you know about the results.  I hope you feel better soon.  If you don't feel better by next week, please call back, so I can request for you to see a specialist.

## 2017-09-07 NOTE — Progress Notes (Signed)
Subjective:    Patient ID: Stacy Walls, female    DOB: [birth year], 84 y.o.   MRN: [id number]  HPI 2 weeks of intermitt mild pain at the bilat temporal areas.  No assoc visual loss.  She is unable to cite precip or relieving factor.   Past Medical History:  Diagnosis Date  . Acute cholecystitis   . Allergic rhinitis   . Anemia   . Chest pain   . Chronic renal insufficiency   . Clostridium difficile colitis   . Coronary atherosclerosis of native coronary vessel   . Cough   . Diabetes mellitus   . Dizziness   . DM2 (diabetes mellitus, type 2) ([place])   . Dyshidrosis   . GERD (gastroesophageal reflux disease)   . Gout   . HLD (hyperlipidemia)   . HTN (hypertension)   . Hypothyroidism   . Neck mass   . Neck pain   . Osteoporosis   . Routine general medical examination at a health care facility   . Secondary hyperparathyroidism (of renal origin)   . Urinary incontinence   . Urinary tract infection     Past Surgical History:  Procedure Laterality Date  . bone density  08/21/05  . C-section (other)  1977  . CHOLECYSTECTOMY    . ELECTROCARDIOGRAM  02/01/06  . L ankle surgery  1988  . total left knee  2007    Social History   Socioeconomic History  . Marital status: Married    Spouse name: Not on file  . Number of children: Not on file  . Years of education: Not on file  . Highest education level: Not on file  Occupational History  . Not on file  Social Needs  . Financial resource strain: Not on file  . Food insecurity:    Worry: Not on file    Inability: Not on file  . Transportation needs:    Medical: Not on file    Non-medical: Not on file  Tobacco Use  . Smoking status: Never Smoker  . Smokeless tobacco: Never Used  Substance and Sexual Activity  . Alcohol use: No  . Drug use: No  . Sexual activity: Not on file  Lifestyle  . Physical activity:    Days per week: Not on file    Minutes per session: Not on file  . Stress: Not on file    Relationships  . Social connections:    Talks on phone: Not on file    Gets together: Not on file    Attends religious service: Not on file    Active member of club or organization: Not on file    Attends meetings of clubs or organizations: Not on file    Relationship status: Not on file  . Intimate partner violence:    Fear of current or ex partner: Not on file    Emotionally abused: Not on file    Physically abused: Not on file    Forced sexual activity: Not on file  Other Topics Concern  . Not on file  Social History Narrative   Lives with husband and has lived in [place] for over 30 years.     Current Outpatient Medications on File Prior to Visit  Medication Sig Dispense Refill  . allopurinol (ZYLOPRIM) 300 MG tablet TAKE 1 TABLET BY MOUTH EVERY DAY 90 tablet 0  . aspirin (ECOTRIN LOW STRENGTH) 81 MG EC tablet Take 81 mg by mouth daily.      . diclofenac  sodium (VOLTAREN) 1 % GEL APPLY 2 GRAMS EXTERNALLY TO THE AFFECTED AREA FOUR TIMES DAILY 100 g 0  . epoetin alfa (PROCRIT) 81856 UNIT/ML injection Inject 10,000 Units into the skin every 30 (thirty) days. Dr. Justin Mend    . Ferrous Sulfate (IRON) 325 (65 FE) MG TABS Take 1 tablet by mouth daily.     . furosemide (LASIX) 20 MG tablet TAKE 1 TABLET(20 MG) BY MOUTH DAILY 90 tablet 0  . losartan-hydrochlorothiazide (HYZAAR) 100-25 MG tablet Take 1 tablet by mouth daily. 90 tablet 1  . Multiple Vitamins-Minerals (MULTIVITAMIN WOMEN 50+ PO) Take by mouth.    Marland Kitchen omeprazole (PRILOSEC) 20 MG capsule TAKE 1 CAPSULE BY MOUTH EVERY DAY 90 capsule 0  . sitaGLIPtin (JANUVIA) 100 MG tablet TAKE 1/2 TABLET(50 MG) BY MOUTH DAILY 45 tablet 3  . triamcinolone ointment (KENALOG) 0.1 % APPLY TOPICALLY TO THE AFFECTED AREA THREE TIMES DAILY AS NEEDED FOR ITCHING 454 g 0  . losartan-hydrochlorothiazide (HYZAAR) 100-25 MG tablet TAKE 1 TABLET BY MOUTH DAILY 90 tablet 0  . Misc Natural Products (TART CHERRY ADVANCED PO) Take 1,200 mg by mouth.     .  vitamin C (ASCORBIC ACID) 500 MG tablet Take 500 mg by mouth daily.     No current facility-administered medications on file prior to visit.     Allergies  Allergen Reactions  . Azithromycin   . Pioglitazone     REACTION: edema  . Sulfonamide Derivatives     REACTION: rash  . Tramadol     dizziness    Family History  Problem Relation Age of Onset  . Coronary artery disease Father   . Stroke Father   . Diabetes Father   . Diabetes Sister   . Stroke Sister   . Diabetes Brother     BP 124/78   Pulse (!) 108   Wt 244 lb 12.8 oz (111 kg)   SpO2 97%   BMI 44.77 kg/m    Review of Systems Urinary incont is well-controlled.  Denies n/v.  Denies nasal congestion.      Objective:   Physical Exam VITAL SIGNS:  See vs page.    GENERAL: no distress.  head: no deformity.   eyes: no periorbital swelling, no proptosis.   external nose and ears are normal.   mouth: no lesion seen.  Both eac's and tm's are normal.   NEURO: CN 2-12 are intact bilat.  Gait is steady with a cane.       Assessment & Plan:  Headache, new, uncertain etiology Hypothyroidism: recheck today Gout: recheck today Pancytopenia: recheck today Secondary hyperparathyroidism: recheck today  Patient Instructions  blood tests are requested for you today.  We'll let you know about the results.  I hope you feel better soon.  If you don't feel better by next week, please call back, so I can request for you to see a specialist.

## 2017-09-08 MED ORDER — LEVOTHYROXINE SODIUM 75 MCG PO TABS: 75.0000 ug | ORAL_TABLET | Freq: Every day | ORAL | 3 refills | Status: DC

## 2017-09-08 MED ORDER — ATORVASTATIN CALCIUM 80 MG PO TABS
80.0000 mg | ORAL_TABLET | Freq: Every day | ORAL | 3 refills | Status: DC
Start: 2017-09-08 — End: 2017-11-18

## 2017-09-09 ENCOUNTER — Telehealth: Payer: Self-pay | Admitting: Endocrinology

## 2017-09-09 LAB — PTH, INTACT AND CALCIUM
Calcium: 10.7 mg/dL — ABNORMAL HIGH (ref 8.6–10.4)
PTH: 94 pg/mL — AB (ref 14–64)

## 2017-09-09 NOTE — Telephone Encounter (Signed)
Patient returning your call.

## 2017-09-09 NOTE — Telephone Encounter (Signed)
Spoke with the patient-gave lab results

## 2017-09-30 ENCOUNTER — Ambulatory Visit (HOSPITAL_COMMUNITY)
Admission: RE | Admit: 2017-09-30 | Discharge: 2017-09-30 | Disposition: A | Discharge status: Home / Self Care | Payer: Medicare Other | Source: Ambulatory Visit | Attending: Nephrology | Admitting: Nephrology

## 2017-09-30 VITALS — BP 125/52 | HR 61 | Temp 98.4°F | Resp 20

## 2017-09-30 DIAGNOSIS — D631 Anemia in chronic kidney disease: Secondary | ICD-10-CM | POA: Diagnosis not present

## 2017-09-30 DIAGNOSIS — N183 Chronic kidney disease, stage 3 unspecified: Secondary | ICD-10-CM

## 2017-09-30 LAB — IRON AND TIBC
IRON: 72 ug/dL (ref 28–170)
Saturation Ratios: 34 % — ABNORMAL HIGH (ref 10.4–31.8)
TIBC: 211 ug/dL — ABNORMAL LOW (ref 250–450)
UIBC: 139 ug/dL

## 2017-09-30 LAB — FERRITIN: Ferritin: 927 ng/mL — ABNORMAL HIGH (ref 11–307)

## 2017-09-30 LAB — POCT HEMOGLOBIN-HEMACUE: Hemoglobin: 11.3 g/dL — ABNORMAL LOW (ref 12.0–15.0)

## 2017-09-30 MED ORDER — EPOETIN ALFA 10000 UNIT/ML IJ SOLN
10000.0000 [IU] | INTRAMUSCULAR | Status: DC
  Administered 2017-09-30: 10000 [IU] via SUBCUTANEOUS

## 2017-09-30 MED ORDER — EPOETIN ALFA 10000 UNIT/ML IJ SOLN
INTRAMUSCULAR | Status: AC
  Filled 2017-09-30: qty 1

## 2017-10-05 ENCOUNTER — Telehealth: Payer: Self-pay

## 2017-10-05 NOTE — Telephone Encounter (Signed)
Patient called and stated she has a bad cold and would like to know what she can take for it. Patient reported no fever at this time but has a lot of congestion and coughing please advise in Dr. Cordelia Pen absence

## 2017-10-05 NOTE — Telephone Encounter (Signed)
Gave patient advice from note below

## 2017-10-05 NOTE — Telephone Encounter (Signed)
Delsym for pts with diabetes or any other syrup without sugar. She can check with the pharmacist about these. Also, Mucinex is safe.

## 2017-10-11 ENCOUNTER — Encounter: Payer: Self-pay | Admitting: Endocrinology

## 2017-10-11 ENCOUNTER — Ambulatory Visit (INDEPENDENT_AMBULATORY_CARE_PROVIDER_SITE_OTHER): Payer: Medicare Other | Admitting: Endocrinology

## 2017-10-11 VITALS — BP 140/66 | HR 78

## 2017-10-11 DIAGNOSIS — R05 Cough: Secondary | ICD-10-CM | POA: Diagnosis not present

## 2017-10-11 DIAGNOSIS — R059 Cough, unspecified: Secondary | ICD-10-CM | POA: Insufficient documentation

## 2017-10-11 MED ORDER — AMOXICILLIN 500 MG PO CAPS: 500.0000 mg | ORAL_CAPSULE | Freq: Three times a day (TID) | ORAL | 0 refills | Status: DC

## 2017-10-11 NOTE — Progress Notes (Signed)
Subjective:    Patient ID: Stacy Walls, female    DOB: 1932/05/24, 84 y.o.   MRN: [id number]  HPI 2 weeks of moderate prod-quality cough, and assoc sore throat.   Past Medical History:  Diagnosis Date  . Acute cholecystitis   . Allergic rhinitis   . Anemia   . Chest pain   . Chronic renal insufficiency   . Clostridium difficile colitis   . Coronary atherosclerosis of native coronary vessel   . Cough   . Diabetes mellitus   . Dizziness   . DM2 (diabetes mellitus, type 2) ([place])   . Dyshidrosis   . GERD (gastroesophageal reflux disease)   . Gout   . HLD (hyperlipidemia)   . HTN (hypertension)   . Hypothyroidism   . Neck mass   . Neck pain   . Osteoporosis   . Routine general medical examination at a health care facility   . Secondary hyperparathyroidism (of renal origin)   . Urinary incontinence   . Urinary tract infection     Past Surgical History:  Procedure Laterality Date  . bone density  08/21/05  . C-section (other)  1977  . CHOLECYSTECTOMY    . ELECTROCARDIOGRAM  02/01/06  . L ankle surgery  1988  . total left knee  2007    Social History   Socioeconomic History  . Marital status: Married    Spouse name: Not on file  . Number of children: Not on file  . Years of education: Not on file  . Highest education level: Not on file  Occupational History  . Not on file  Social Needs  . Financial resource strain: Not on file  . Food insecurity:    Worry: Not on file    Inability: Not on file  . Transportation needs:    Medical: Not on file    Non-medical: Not on file  Tobacco Use  . Smoking status: Never Smoker  . Smokeless tobacco: Never Used  Substance and Sexual Activity  . Alcohol use: No  . Drug use: No  . Sexual activity: Not on file  Lifestyle  . Physical activity:    Days per week: Not on file    Minutes per session: Not on file  . Stress: Not on file  Relationships  . Social connections:    Talks on phone: Not on file    Gets  together: Not on file    Attends religious service: Not on file    Active member of club or organization: Not on file    Attends meetings of clubs or organizations: Not on file    Relationship status: Not on file  . Intimate partner violence:    Fear of current or ex partner: Not on file    Emotionally abused: Not on file    Physically abused: Not on file    Forced sexual activity: Not on file  Other Topics Concern  . Not on file  Social History Narrative   Lives with husband and has lived in [place] for over 30 years.     Current Outpatient Medications on File Prior to Visit  Medication Sig Dispense Refill  . allopurinol (ZYLOPRIM) 300 MG tablet TAKE 1 TABLET BY MOUTH EVERY DAY 90 tablet 0  . aspirin (ECOTRIN LOW STRENGTH) 81 MG EC tablet Take 81 mg by mouth daily.      Marland Kitchen atorvastatin (LIPITOR) 80 MG tablet Take 1 tablet (80 mg total) by mouth daily. 90 tablet 3  .  diclofenac sodium (VOLTAREN) 1 % GEL APPLY 2 GRAMS EXTERNALLY TO THE AFFECTED AREA FOUR TIMES DAILY 100 g 0  . epoetin alfa (PROCRIT) 47096 UNIT/ML injection Inject 10,000 Units into the skin every 30 (thirty) days. Dr. Justin Mend    . Ferrous Sulfate (IRON) 325 (65 FE) MG TABS Take 1 tablet by mouth daily.     . furosemide (LASIX) 20 MG tablet TAKE 1 TABLET(20 MG) BY MOUTH DAILY 90 tablet 0  . levothyroxine (SYNTHROID, LEVOTHROID) 75 MCG tablet Take 1 tablet (75 mcg total) by mouth daily. 90 tablet 3  . losartan-hydrochlorothiazide (HYZAAR) 100-25 MG tablet Take 1 tablet by mouth daily. 90 tablet 1  . losartan-hydrochlorothiazide (HYZAAR) 100-25 MG tablet TAKE 1 TABLET BY MOUTH DAILY 90 tablet 0  . Misc Natural Products (TART CHERRY ADVANCED PO) Take 1,200 mg by mouth.     . Multiple Vitamins-Minerals (MULTIVITAMIN WOMEN 50+ PO) Take by mouth.    Marland Kitchen omeprazole (PRILOSEC) 20 MG capsule TAKE 1 CAPSULE BY MOUTH EVERY DAY 90 capsule 0  . oxybutynin (DITROPAN) 5 MG tablet 1 tab daily 90 tablet 3  . sitaGLIPtin (JANUVIA) 100 MG  tablet TAKE 1/2 TABLET(50 MG) BY MOUTH DAILY 45 tablet 3  . triamcinolone ointment (KENALOG) 0.1 % APPLY TOPICALLY TO THE AFFECTED AREA THREE TIMES DAILY AS NEEDED FOR ITCHING 454 g 0  . vitamin C (ASCORBIC ACID) 500 MG tablet Take 500 mg by mouth daily.     No current facility-administered medications on file prior to visit.     Allergies  Allergen Reactions  . Azithromycin   . Pioglitazone     REACTION: edema  . Sulfonamide Derivatives     REACTION: rash  . Tramadol     dizziness    Family History  Problem Relation Age of Onset  . Coronary artery disease Father   . Stroke Father   . Diabetes Father   . Diabetes Sister   . Stroke Sister   . Diabetes Brother     BP 140/66 (BP Location: Left Wrist, Patient Position: Sitting, Cuff Size: Normal)   Pulse 78   SpO2 96%    Review of Systems Denies fever, earache, and sob.      Objective:   Physical Exam VITAL SIGNS:  See vs page GENERAL: no distress Both eac's and tm's are normal LUNGS:  Clear to auscultation.        Assessment & Plan:  Cough, new.   Hypothyroidism: I advised recheck today, but she declines. HTN, poss exac by coughing: recheck next time.    Patient Instructions  I have sent a prescription to your pharmacy, for the antibiotic pill.   Loratadine-d (non-prescription) will help your congestion. I hope you feel better soon.  If you don't feel better by next week, please call back.  Please call sooner if you get worse.  I'll see you next time.

## 2017-10-11 NOTE — Patient Instructions (Addendum)
I have sent a prescription to your pharmacy, for the antibiotic pill.   Loratadine-d (non-prescription) will help your congestion. I hope you feel better soon.  If you don't feel better by next week, please call back.  Please call sooner if you get worse.  I'll see you next time.

## 2017-10-12 ENCOUNTER — Telehealth: Payer: Self-pay

## 2017-10-12 ENCOUNTER — Ambulatory Visit: Payer: Medicare Other | Admitting: Endocrinology

## 2017-10-12 NOTE — Telephone Encounter (Signed)
LVM with instructions from note below and requested a call back if she has any questions

## 2017-10-12 NOTE — Telephone Encounter (Signed)
Just try cutting the 500 mg pill in half.  Let us know if this does not work.

## 2017-10-12 NOTE — Telephone Encounter (Signed)
Patient calling back today- she was seen yesterday and an antibiotic was sent in for her-patient states she needs a lower dosage due to her kidneys she usually takes 250 mg instead of the 500 that was sent please advise

## 2017-10-13 ENCOUNTER — Telehealth: Payer: Self-pay | Admitting: Endocrinology

## 2017-10-13 MED ORDER — AMOXICILLIN 250 MG PO CAPS: 250.0000 mg | ORAL_CAPSULE | Freq: Three times a day (TID) | ORAL | 0 refills | Status: DC

## 2017-10-13 NOTE — Telephone Encounter (Signed)
I have sent a prescription to your pharmacy  

## 2017-10-13 NOTE — Telephone Encounter (Signed)
Patient stated the medication is amoxicillin it  is a capsule, can't cut a capsule in half.  Please advise

## 2017-10-13 NOTE — Telephone Encounter (Signed)
Called patient to advise her of note below- she stated an understanding

## 2017-10-27 ENCOUNTER — Other Ambulatory Visit (HOSPITAL_COMMUNITY): Payer: Self-pay

## 2017-10-28 ENCOUNTER — Encounter (HOSPITAL_COMMUNITY): Payer: Medicare Other

## 2017-10-28 ENCOUNTER — Other Ambulatory Visit: Payer: Self-pay | Admitting: Endocrinology

## 2017-11-02 ENCOUNTER — Ambulatory Visit (HOSPITAL_COMMUNITY)
Admission: RE | Admit: 2017-11-02 | Discharge: 2017-11-02 | Disposition: A | Discharge status: Home / Self Care | Payer: Medicare Other | Source: Ambulatory Visit | Attending: Nephrology | Admitting: Nephrology

## 2017-11-02 VITALS — BP 133/62 | HR 73 | Temp 98.1°F | Resp 18

## 2017-11-02 DIAGNOSIS — D631 Anemia in chronic kidney disease: Secondary | ICD-10-CM | POA: Insufficient documentation

## 2017-11-02 DIAGNOSIS — N183 Chronic kidney disease, stage 3 unspecified: Secondary | ICD-10-CM

## 2017-11-02 LAB — IRON AND TIBC
Iron: 64 ug/dL (ref 28–170)
Saturation Ratios: 34 % — ABNORMAL HIGH (ref 10.4–31.8)
TIBC: 189 ug/dL — ABNORMAL LOW (ref 250–450)
UIBC: 125 ug/dL

## 2017-11-02 LAB — POCT HEMOGLOBIN-HEMACUE: HEMOGLOBIN: 11.8 g/dL — AB (ref 12.0–15.0)

## 2017-11-02 LAB — FERRITIN: FERRITIN: 1039 ng/mL — AB (ref 11–307)

## 2017-11-02 MED ORDER — EPOETIN ALFA 10000 UNIT/ML IJ SOLN
INTRAMUSCULAR | Status: AC
  Administered 2017-11-02: 10000 [IU] via SUBCUTANEOUS
  Filled 2017-11-02: qty 1

## 2017-11-02 MED ORDER — EPOETIN ALFA 10000 UNIT/ML IJ SOLN
10000.0000 [IU] | INTRAMUSCULAR | Status: DC
  Administered 2017-11-02: 10000 [IU] via SUBCUTANEOUS

## 2017-11-03 ENCOUNTER — Ambulatory Visit: Payer: Medicare Other | Admitting: Endocrinology

## 2017-11-07 ENCOUNTER — Other Ambulatory Visit: Payer: Self-pay | Admitting: Endocrinology

## 2017-11-18 ENCOUNTER — Encounter: Payer: Self-pay | Admitting: Endocrinology

## 2017-11-18 ENCOUNTER — Ambulatory Visit (INDEPENDENT_AMBULATORY_CARE_PROVIDER_SITE_OTHER): Payer: Medicare Other | Admitting: Endocrinology

## 2017-11-18 VITALS — BP 118/76 | HR 84 | Temp 98.0°F | Ht 62.0 in | Wt 246.0 lb

## 2017-11-18 DIAGNOSIS — N183 Chronic kidney disease, stage 3 unspecified: Secondary | ICD-10-CM

## 2017-11-18 DIAGNOSIS — E1122 Type 2 diabetes mellitus with diabetic chronic kidney disease: Secondary | ICD-10-CM

## 2017-11-18 DIAGNOSIS — Z1231 Encounter for screening mammogram for malignant neoplasm of breast: Secondary | ICD-10-CM | POA: Diagnosis not present

## 2017-11-18 DIAGNOSIS — Z1239 Encounter for other screening for malignant neoplasm of breast: Secondary | ICD-10-CM

## 2017-11-18 LAB — POCT GLYCOSYLATED HEMOGLOBIN (HGB A1C): Hemoglobin A1C: 6 % — AB (ref 4.0–5.6)

## 2017-11-18 NOTE — Progress Notes (Signed)
Subjective:    Patient ID: Stacy Walls, female    DOB: Mar 25, 1933, 84 y.o.   MRN: [id number]  HPI HTN: denies chest pain Type 2 DM: no weight change.   GERD: pt says prilosec helps.   Past Medical History:  Diagnosis Date  . Acute cholecystitis   . Allergic rhinitis   . Anemia   . Chest pain   . Chronic renal insufficiency   . Clostridium difficile colitis   . Coronary atherosclerosis of native coronary vessel   . Cough   . Diabetes mellitus   . Dizziness   . DM2 (diabetes mellitus, type 2) ([place])   . Dyshidrosis   . GERD (gastroesophageal reflux disease)   . Gout   . HLD (hyperlipidemia)   . HTN (hypertension)   . Hypothyroidism   . Neck mass   . Neck pain   . Osteoporosis   . Routine general medical examination at a health care facility   . Secondary hyperparathyroidism (of renal origin)   . Urinary incontinence   . Urinary tract infection     Past Surgical History:  Procedure Laterality Date  . bone density  08/21/05  . C-section (other)  1977  . CHOLECYSTECTOMY    . ELECTROCARDIOGRAM  02/01/06  . L ankle surgery  1988  . total left knee  2007    Social History   Socioeconomic History  . Marital status: Married    Spouse name: Not on file  . Number of children: Not on file  . Years of education: Not on file  . Highest education level: Not on file  Occupational History  . Not on file  Social Needs  . Financial resource strain: Not on file  . Food insecurity:    Worry: Not on file    Inability: Not on file  . Transportation needs:    Medical: Not on file    Non-medical: Not on file  Tobacco Use  . Smoking status: Never Smoker  . Smokeless tobacco: Never Used  Substance and Sexual Activity  . Alcohol use: No  . Drug use: No  . Sexual activity: Not on file  Lifestyle  . Physical activity:    Days per week: Not on file    Minutes per session: Not on file  . Stress: Not on file  Relationships  . Social connections:    Talks on phone:  Not on file    Gets together: Not on file    Attends religious service: Not on file    Active member of club or organization: Not on file    Attends meetings of clubs or organizations: Not on file    Relationship status: Not on file  . Intimate partner violence:    Fear of current or ex partner: Not on file    Emotionally abused: Not on file    Physically abused: Not on file    Forced sexual activity: Not on file  Other Topics Concern  . Not on file  Social History Narrative   Lives with husband and has lived in [place] for over 30 years.     Current Outpatient Medications on File Prior to Visit  Medication Sig Dispense Refill  . allopurinol (ZYLOPRIM) 300 MG tablet TAKE 1 TABLET BY MOUTH EVERY DAY 90 tablet 0  . aspirin (ECOTRIN LOW STRENGTH) 81 MG EC tablet Take 81 mg by mouth daily.      . diclofenac sodium (VOLTAREN) 1 % GEL APPLY 2 GRAMS EXTERNALLY TO  THE AFFECTED AREA FOUR TIMES DAILY 100 g 0  . epoetin alfa (PROCRIT) 62130 UNIT/ML injection Inject 10,000 Units into the skin every 30 (thirty) days. Dr. Justin Mend    . Ferrous Sulfate (IRON) 325 (65 FE) MG TABS Take 1 tablet by mouth daily.     . furosemide (LASIX) 20 MG tablet TAKE 1 TABLET(20 MG) BY MOUTH DAILY 90 tablet 0  . levothyroxine (SYNTHROID, LEVOTHROID) 75 MCG tablet Take 1 tablet (75 mcg total) by mouth daily. 90 tablet 3  . losartan-hydrochlorothiazide (HYZAAR) 100-25 MG tablet Take 1 tablet by mouth daily. 90 tablet 1  . Misc Natural Products (TART CHERRY ADVANCED PO) Take 1,200 mg by mouth.     . Multiple Vitamins-Minerals (MULTIVITAMIN WOMEN 50+ PO) Take by mouth.    . oxybutynin (DITROPAN) 5 MG tablet 1 tab daily 90 tablet 3  . sitaGLIPtin (JANUVIA) 100 MG tablet TAKE 1/2 TABLET(50 MG) BY MOUTH DAILY 45 tablet 3  . triamcinolone ointment (KENALOG) 0.1 % APPLY TOPICALLY TO THE AFFECTED AREA THREE TIMES DAILY AS NEEDED FOR ITCHING 454 g 0  . vitamin C (ASCORBIC ACID) 500 MG tablet Take 500 mg by mouth daily.     No  current facility-administered medications on file prior to visit.     Allergies  Allergen Reactions  . Ace Inhibitors Cough  . Azithromycin   . Pioglitazone     REACTION: edema  . Sulfonamide Derivatives     REACTION: rash  . Tramadol     dizziness    Family History  Problem Relation Age of Onset  . Coronary artery disease Father   . Stroke Father   . Diabetes Father   . Diabetes Sister   . Stroke Sister   . Diabetes Brother     BP 118/76 (BP Location: Right Arm, Patient Position: Sitting, Cuff Size: Normal)   Pulse 84   Temp 98 F (36.7 C) (Oral)   Ht 5\' 2"  (1.575 m)   Wt 246 lb (111.6 kg)   SpO2 97%   BMI 44.99 kg/m   Review of Systems Denies sob and nausea.      Objective:   Physical Exam VITAL SIGNS:  See vs page GENERAL: no distress Pulses: dorsalis pedis intact bilat.   MSK: no deformity of the feet CV: 1+ bilat leg edema Skin:  no ulcer on the feet.  normal color and temp on the feet. Neuro: sensation is intact to touch on the feet.    Lab Results  Component Value Date   HGBA1C 6.0 (A) 11/18/2017      Assessment & Plan:  GERD: well-controlled HTN: well-controlled Type 2 DM: well-controlled   Patient Instructions  Please try going off the omeprazole. Please continue the same other medications.

## 2017-11-18 NOTE — Patient Instructions (Addendum)
Please try going off the omeprazole. Please continue the same other medications.

## 2017-11-25 ENCOUNTER — Encounter (HOSPITAL_COMMUNITY): Payer: Medicare Other

## 2017-11-30 ENCOUNTER — Encounter (HOSPITAL_COMMUNITY)
Admission: RE | Admit: 2017-11-30 | Discharge: 2017-11-30 | Disposition: A | Discharge status: Home / Self Care | Payer: Medicare Other | Source: Ambulatory Visit | Attending: Nephrology | Admitting: Nephrology

## 2017-11-30 VITALS — BP 124/65 | HR 67

## 2017-11-30 DIAGNOSIS — D631 Anemia in chronic kidney disease: Secondary | ICD-10-CM | POA: Insufficient documentation

## 2017-11-30 DIAGNOSIS — N183 Chronic kidney disease, stage 3 unspecified: Secondary | ICD-10-CM

## 2017-11-30 LAB — FERRITIN: FERRITIN: 1077 ng/mL — AB (ref 11–307)

## 2017-11-30 LAB — IRON AND TIBC
Iron: 63 ug/dL (ref 28–170)
SATURATION RATIOS: 32 % — AB (ref 10.4–31.8)
TIBC: 196 ug/dL — ABNORMAL LOW (ref 250–450)
UIBC: 133 ug/dL

## 2017-11-30 LAB — POCT HEMOGLOBIN-HEMACUE: Hemoglobin: 13.4 g/dL (ref 12.0–15.0)

## 2017-11-30 MED ORDER — EPOETIN ALFA 10000 UNIT/ML IJ SOLN: 10000.0000 [IU] | INTRAMUSCULAR | Status: DC

## 2017-12-07 ENCOUNTER — Other Ambulatory Visit: Payer: Self-pay | Admitting: Endocrinology

## 2017-12-14 ENCOUNTER — Other Ambulatory Visit: Payer: Self-pay | Admitting: Endocrinology

## 2017-12-14 ENCOUNTER — Ambulatory Visit (HOSPITAL_COMMUNITY)
Admission: RE | Admit: 2017-12-14 | Discharge: 2017-12-14 | Disposition: A | Discharge status: Home / Self Care | Payer: Medicare Other | Source: Ambulatory Visit | Attending: Nephrology | Admitting: Nephrology

## 2017-12-14 VITALS — BP 144/67 | HR 66 | Temp 98.1°F

## 2017-12-14 DIAGNOSIS — N183 Chronic kidney disease, stage 3 unspecified: Secondary | ICD-10-CM

## 2017-12-14 DIAGNOSIS — D631 Anemia in chronic kidney disease: Secondary | ICD-10-CM | POA: Insufficient documentation

## 2017-12-14 LAB — POCT HEMOGLOBIN-HEMACUE: Hemoglobin: 12.2 g/dL (ref 12.0–15.0)

## 2017-12-14 MED ORDER — EPOETIN ALFA 10000 UNIT/ML IJ SOLN: 10000.0000 [IU] | INTRAMUSCULAR | Status: DC

## 2017-12-27 ENCOUNTER — Ambulatory Visit (HOSPITAL_COMMUNITY)
Admission: RE | Admit: 2017-12-27 | Discharge: 2017-12-27 | Disposition: A | Discharge status: Home / Self Care | Payer: Medicare Other | Source: Ambulatory Visit | Attending: Nephrology | Admitting: Nephrology

## 2017-12-27 VITALS — BP 128/60 | HR 62 | Temp 97.8°F | Resp 20

## 2017-12-27 DIAGNOSIS — N183 Chronic kidney disease, stage 3 unspecified: Secondary | ICD-10-CM

## 2017-12-27 DIAGNOSIS — D631 Anemia in chronic kidney disease: Secondary | ICD-10-CM | POA: Diagnosis not present

## 2017-12-27 LAB — FERRITIN: Ferritin: 922 ng/mL — ABNORMAL HIGH (ref 11–307)

## 2017-12-27 LAB — POCT HEMOGLOBIN-HEMACUE: Hemoglobin: 10.8 g/dL — ABNORMAL LOW (ref 12.0–15.0)

## 2017-12-27 LAB — IRON AND TIBC
IRON: 79 ug/dL (ref 28–170)
SATURATION RATIOS: 39 % — AB (ref 10.4–31.8)
TIBC: 204 ug/dL — ABNORMAL LOW (ref 250–450)
UIBC: 125 ug/dL

## 2017-12-27 MED ORDER — EPOETIN ALFA 10000 UNIT/ML IJ SOLN
INTRAMUSCULAR | Status: AC
  Filled 2017-12-27: qty 1

## 2017-12-27 MED ORDER — EPOETIN ALFA 10000 UNIT/ML IJ SOLN
10000.0000 [IU] | INTRAMUSCULAR | Status: DC
  Administered 2017-12-27: 10000 [IU] via SUBCUTANEOUS

## 2017-12-28 ENCOUNTER — Ambulatory Visit
Admission: RE | Admit: 2017-12-28 | Discharge: 2017-12-28 | Disposition: A | Discharge status: Home / Self Care | Payer: Medicare Other | Source: Ambulatory Visit | Attending: Endocrinology | Admitting: Endocrinology

## 2017-12-28 ENCOUNTER — Encounter (HOSPITAL_COMMUNITY): Payer: Medicare Other

## 2017-12-28 DIAGNOSIS — Z1231 Encounter for screening mammogram for malignant neoplasm of breast: Secondary | ICD-10-CM | POA: Diagnosis not present

## 2017-12-28 DIAGNOSIS — Z1239 Encounter for other screening for malignant neoplasm of breast: Secondary | ICD-10-CM

## 2017-12-29 ENCOUNTER — Other Ambulatory Visit: Payer: Self-pay | Admitting: Endocrinology

## 2017-12-29 DIAGNOSIS — R928 Other abnormal and inconclusive findings on diagnostic imaging of breast: Secondary | ICD-10-CM

## 2018-01-05 ENCOUNTER — Other Ambulatory Visit: Payer: Medicare Other

## 2018-01-11 ENCOUNTER — Encounter (HOSPITAL_COMMUNITY): Payer: Medicare Other

## 2018-01-12 ENCOUNTER — Ambulatory Visit
Admission: RE | Admit: 2018-01-12 | Discharge: 2018-01-12 | Disposition: A | Discharge status: Home / Self Care | Payer: Medicare Other | Source: Ambulatory Visit | Attending: Endocrinology | Admitting: Endocrinology

## 2018-01-12 ENCOUNTER — Other Ambulatory Visit: Payer: Self-pay | Admitting: Endocrinology

## 2018-01-12 DIAGNOSIS — N6310 Unspecified lump in the right breast, unspecified quadrant: Secondary | ICD-10-CM

## 2018-01-12 DIAGNOSIS — R928 Other abnormal and inconclusive findings on diagnostic imaging of breast: Secondary | ICD-10-CM | POA: Diagnosis not present

## 2018-01-12 DIAGNOSIS — R599 Enlarged lymph nodes, unspecified: Secondary | ICD-10-CM

## 2018-01-19 DIAGNOSIS — M1990 Unspecified osteoarthritis, unspecified site: Secondary | ICD-10-CM | POA: Diagnosis not present

## 2018-01-19 DIAGNOSIS — D631 Anemia in chronic kidney disease: Secondary | ICD-10-CM | POA: Diagnosis not present

## 2018-01-19 DIAGNOSIS — M109 Gout, unspecified: Secondary | ICD-10-CM | POA: Diagnosis not present

## 2018-01-19 DIAGNOSIS — N63 Unspecified lump in unspecified breast: Secondary | ICD-10-CM | POA: Diagnosis not present

## 2018-01-19 DIAGNOSIS — N2581 Secondary hyperparathyroidism of renal origin: Secondary | ICD-10-CM | POA: Diagnosis not present

## 2018-01-19 DIAGNOSIS — E1122 Type 2 diabetes mellitus with diabetic chronic kidney disease: Secondary | ICD-10-CM | POA: Diagnosis not present

## 2018-01-19 DIAGNOSIS — N183 Chronic kidney disease, stage 3 (moderate): Secondary | ICD-10-CM | POA: Diagnosis not present

## 2018-01-19 DIAGNOSIS — I129 Hypertensive chronic kidney disease with stage 1 through stage 4 chronic kidney disease, or unspecified chronic kidney disease: Secondary | ICD-10-CM | POA: Diagnosis not present

## 2018-01-19 DIAGNOSIS — E039 Hypothyroidism, unspecified: Secondary | ICD-10-CM | POA: Diagnosis not present

## 2018-01-24 ENCOUNTER — Ambulatory Visit (HOSPITAL_COMMUNITY)
Admission: RE | Admit: 2018-01-24 | Discharge: 2018-01-24 | Disposition: A | Discharge status: Home / Self Care | Payer: Medicare Other | Source: Ambulatory Visit | Attending: Nephrology | Admitting: Nephrology

## 2018-01-24 VITALS — BP 116/62 | HR 67 | Temp 98.3°F

## 2018-01-24 DIAGNOSIS — N183 Chronic kidney disease, stage 3 unspecified: Secondary | ICD-10-CM

## 2018-01-24 DIAGNOSIS — D631 Anemia in chronic kidney disease: Secondary | ICD-10-CM | POA: Insufficient documentation

## 2018-01-24 LAB — IRON AND TIBC
Iron: 74 ug/dL (ref 28–170)
SATURATION RATIOS: 37 % — AB (ref 10.4–31.8)
TIBC: 202 ug/dL — AB (ref 250–450)
UIBC: 128 ug/dL

## 2018-01-24 LAB — FERRITIN: Ferritin: 1334 ng/mL — ABNORMAL HIGH (ref 11–307)

## 2018-01-24 LAB — POCT HEMOGLOBIN-HEMACUE: Hemoglobin: 11.6 g/dL — ABNORMAL LOW (ref 12.0–15.0)

## 2018-01-24 MED ORDER — EPOETIN ALFA 10000 UNIT/ML IJ SOLN: 10000.0000 [IU] | INTRAMUSCULAR | Status: DC

## 2018-01-24 MED ORDER — EPOETIN ALFA 10000 UNIT/ML IJ SOLN
INTRAMUSCULAR | Status: AC
  Administered 2018-01-24: 10000 [IU]
  Filled 2018-01-24: qty 1

## 2018-01-27 ENCOUNTER — Ambulatory Visit (INDEPENDENT_AMBULATORY_CARE_PROVIDER_SITE_OTHER): Payer: Medicare Other | Admitting: Endocrinology

## 2018-01-27 ENCOUNTER — Encounter: Payer: Self-pay | Admitting: Endocrinology

## 2018-01-27 VITALS — BP 126/82 | HR 66 | Ht 62.0 in | Wt 247.8 lb

## 2018-01-27 DIAGNOSIS — N183 Chronic kidney disease, stage 3 unspecified: Secondary | ICD-10-CM

## 2018-01-27 DIAGNOSIS — E039 Hypothyroidism, unspecified: Secondary | ICD-10-CM | POA: Diagnosis not present

## 2018-01-27 DIAGNOSIS — E1122 Type 2 diabetes mellitus with diabetic chronic kidney disease: Secondary | ICD-10-CM | POA: Diagnosis not present

## 2018-01-27 LAB — POCT GLYCOSYLATED HEMOGLOBIN (HGB A1C): HEMOGLOBIN A1C: 5.9 % — AB (ref 4.0–5.6)

## 2018-01-27 LAB — TSH: TSH: 7.97 u[IU]/mL — ABNORMAL HIGH (ref 0.35–4.50)

## 2018-01-27 MED ORDER — LOSARTAN POTASSIUM-HCTZ 100-25 MG PO TABS: 1.0000 | ORAL_TABLET | Freq: Every day | ORAL | 3 refills | Status: DC

## 2018-01-27 MED ORDER — ALLOPURINOL 300 MG PO TABS: 300.0000 mg | ORAL_TABLET | Freq: Every day | ORAL | 3 refills | Status: DC

## 2018-01-27 MED ORDER — FUROSEMIDE 20 MG PO TABS: 20.0000 mg | ORAL_TABLET | Freq: Every day | ORAL | 3 refills | Status: DC

## 2018-01-27 MED ORDER — SITAGLIPTIN PHOSPHATE 100 MG PO TABS: ORAL_TABLET | ORAL | 3 refills | Status: DC

## 2018-01-27 MED ORDER — LEVOTHYROXINE SODIUM 88 MCG PO TABS: 88.0000 ug | ORAL_TABLET | Freq: Every day | ORAL | 3 refills | Status: DC

## 2018-01-27 NOTE — Patient Instructions (Addendum)
Please continue the same medications. blood tests are requested for you today.  We'll let you know about the results.  I have sent a prescription to your pharmacy, to resume the omeprazole.   Please come back for a follow-up appointment in 6 months, for the diabetes and thyroid.

## 2018-01-27 NOTE — Progress Notes (Signed)
Subjective:    Patient ID: Stacy Walls, female    DOB: 06-28-1932, 84 y.o.   MRN: [id number]  HPI Pt returns for f/u of diabetes mellitus: DM type: 2 Dx'ed: 2952 Complications: renal insuff Therapy: januvia GDM: never DKA: never Severe hypoglycemia: never Pancreatitis: never Pancreatic imaging: normal on 2011 CT Other: she has never been on insulin.  Interval history: Since off the prilosec, heartburn has recurred.   Past Medical History:  Diagnosis Date  . Acute cholecystitis   . Allergic rhinitis   . Anemia   . Chest pain   . Chronic renal insufficiency   . Clostridium difficile colitis   . Coronary atherosclerosis of native coronary vessel   . Cough   . Diabetes mellitus   . Dizziness   . DM2 (diabetes mellitus, type 2) ([place])   . Dyshidrosis   . GERD (gastroesophageal reflux disease)   . Gout   . HLD (hyperlipidemia)   . HTN (hypertension)   . Hypothyroidism   . Neck mass   . Neck pain   . Osteoporosis   . Routine general medical examination at a health care facility   . Secondary hyperparathyroidism (of renal origin)   . Urinary incontinence   . Urinary tract infection     Past Surgical History:  Procedure Laterality Date  . bone density  08/21/05  . C-section (other)  1977  . CHOLECYSTECTOMY    . ELECTROCARDIOGRAM  02/01/06  . L ankle surgery  1988  . total left knee  2007    Social History   Socioeconomic History  . Marital status: Married    Spouse name: Not on file  . Number of children: Not on file  . Years of education: Not on file  . Highest education level: Not on file  Occupational History  . Not on file  Social Needs  . Financial resource strain: Not on file  . Food insecurity:    Worry: Not on file    Inability: Not on file  . Transportation needs:    Medical: Not on file    Non-medical: Not on file  Tobacco Use  . Smoking status: Never Smoker  . Smokeless tobacco: Never Used  Substance and Sexual Activity  . Alcohol  use: No  . Drug use: No  . Sexual activity: Not on file  Lifestyle  . Physical activity:    Days per week: Not on file    Minutes per session: Not on file  . Stress: Not on file  Relationships  . Social connections:    Talks on phone: Not on file    Gets together: Not on file    Attends religious service: Not on file    Active member of club or organization: Not on file    Attends meetings of clubs or organizations: Not on file    Relationship status: Not on file  . Intimate partner violence:    Fear of current or ex partner: Not on file    Emotionally abused: Not on file    Physically abused: Not on file    Forced sexual activity: Not on file  Other Topics Concern  . Not on file  Social History Narrative   Lives with husband and has lived in [place] for over 30 years.     Current Outpatient Medications on File Prior to Visit  Medication Sig Dispense Refill  . aspirin (ECOTRIN LOW STRENGTH) 81 MG EC tablet Take 81 mg by mouth daily.      Marland Kitchen  diclofenac sodium (VOLTAREN) 1 % GEL APPLY 2 GRAMS EXTERNALLY TO THE AFFECTED AREA FOUR TIMES DAILY 100 g 0  . epoetin alfa (PROCRIT) 98921 UNIT/ML injection Inject 10,000 Units into the skin every 30 (thirty) days. Dr. Justin Mend    . Ferrous Sulfate (IRON) 325 (65 FE) MG TABS Take 1 tablet by mouth daily.     . Misc Natural Products (TART CHERRY ADVANCED PO) Take 1,200 mg by mouth.     . Multiple Vitamins-Minerals (MULTIVITAMIN WOMEN 50+ PO) Take by mouth.    . oxybutynin (DITROPAN) 5 MG tablet 1 tab daily 90 tablet 3  . oxybutynin (DITROPAN) 5 MG tablet TAKE 1 TABLET(5 MG) BY MOUTH DAILY 90 tablet 0  . triamcinolone ointment (KENALOG) 0.1 % APPLY TOPICALLY TO THE AFFECTED AREA THREE TIMES DAILY AS NEEDED FOR ITCHING 454 g 0  . vitamin C (ASCORBIC ACID) 500 MG tablet Take 500 mg by mouth daily.     No current facility-administered medications on file prior to visit.     Allergies  Allergen Reactions  . Ace Inhibitors Cough  .  Azithromycin   . Pioglitazone     REACTION: edema  . Sulfonamide Derivatives     REACTION: rash  . Tramadol     dizziness    Family History  Problem Relation Age of Onset  . Coronary artery disease Father   . Stroke Father   . Diabetes Father   . Diabetes Sister   . Stroke Sister   . Diabetes Brother   . Breast cancer Neg Hx     BP 126/82 (BP Location: Right Wrist)   Pulse 66   Ht 5\' 2"  (1.575 m)   Wt 247 lb 12.8 oz (112.4 kg)   SpO2 97%   BMI 45.32 kg/m    Review of Systems She denies hypoglycemia    Objective:   Physical Exam VITAL SIGNS:  See vs page GENERAL: no distress Pulses: dorsalis pedis intact bilat.   MSK: no deformity of the feet.   CV: 1+ bilat leg edema.   Skin:  no ulcer on the feet.  normal color and temp on the feet.  Neuro: sensation is intact to touch on the feet.     Lab Results  Component Value Date   HGBA1C 5.9 (A) 01/27/2018      Assessment & Plan:  GERD: recurrent off rx Type 2 DM: well-controlled Edema: this limits rx options.  Patient Instructions  Please continue the same medications. blood tests are requested for you today.  We'll let you know about the results.  I have sent a prescription to your pharmacy, to resume the omeprazole.   Please come back for a follow-up appointment in 6 months, for the diabetes and thyroid.

## 2018-01-28 ENCOUNTER — Telehealth: Payer: Self-pay | Admitting: Endocrinology

## 2018-01-28 NOTE — Telephone Encounter (Signed)
Pt called back to get lab results

## 2018-01-28 NOTE — Telephone Encounter (Signed)
No vm set up will try again later

## 2018-01-31 NOTE — Telephone Encounter (Signed)
Gave patient results and she stated an understanding  

## 2018-02-02 ENCOUNTER — Other Ambulatory Visit: Payer: Medicare Other

## 2018-02-16 ENCOUNTER — Ambulatory Visit: Payer: Medicare Other

## 2018-02-20 ENCOUNTER — Other Ambulatory Visit: Payer: Self-pay | Admitting: Endocrinology

## 2018-02-21 ENCOUNTER — Other Ambulatory Visit: Payer: Self-pay | Admitting: Endocrinology

## 2018-02-21 ENCOUNTER — Ambulatory Visit (HOSPITAL_COMMUNITY)
Admission: RE | Admit: 2018-02-21 | Discharge: 2018-02-21 | Disposition: A | Discharge status: Home / Self Care | Payer: Medicare Other | Source: Ambulatory Visit | Attending: Nephrology | Admitting: Nephrology

## 2018-02-21 VITALS — BP 139/63 | HR 73 | Temp 98.3°F | Resp 20

## 2018-02-21 DIAGNOSIS — N183 Chronic kidney disease, stage 3 unspecified: Secondary | ICD-10-CM

## 2018-02-21 LAB — IRON AND TIBC
Iron: 56 ug/dL (ref 28–170)
Saturation Ratios: 29 % (ref 10.4–31.8)
TIBC: 193 ug/dL — ABNORMAL LOW (ref 250–450)
UIBC: 137 ug/dL

## 2018-02-21 LAB — POCT HEMOGLOBIN-HEMACUE: Hemoglobin: 11.3 g/dL — ABNORMAL LOW (ref 12.0–15.0)

## 2018-02-21 LAB — FERRITIN: Ferritin: 1297 ng/mL — ABNORMAL HIGH (ref 11–307)

## 2018-02-21 MED ORDER — EPOETIN ALFA 10000 UNIT/ML IJ SOLN
10000.0000 [IU] | INTRAMUSCULAR | Status: DC
  Administered 2018-02-21: 10000 [IU] via SUBCUTANEOUS

## 2018-02-21 MED ORDER — EPOETIN ALFA 10000 UNIT/ML IJ SOLN
INTRAMUSCULAR | Status: AC
  Administered 2018-02-21: 10000 [IU] via SUBCUTANEOUS
  Filled 2018-02-21: qty 1

## 2018-03-02 ENCOUNTER — Other Ambulatory Visit: Payer: Medicare Other

## 2018-03-04 DIAGNOSIS — N183 Chronic kidney disease, stage 3 (moderate): Secondary | ICD-10-CM | POA: Diagnosis not present

## 2018-03-04 DIAGNOSIS — Z79899 Other long term (current) drug therapy: Secondary | ICD-10-CM | POA: Diagnosis not present

## 2018-03-04 DIAGNOSIS — M109 Gout, unspecified: Secondary | ICD-10-CM | POA: Diagnosis not present

## 2018-03-04 DIAGNOSIS — E039 Hypothyroidism, unspecified: Secondary | ICD-10-CM | POA: Diagnosis not present

## 2018-03-04 DIAGNOSIS — I10 Essential (primary) hypertension: Secondary | ICD-10-CM | POA: Diagnosis not present

## 2018-03-21 ENCOUNTER — Ambulatory Visit (HOSPITAL_COMMUNITY)
Admission: RE | Admit: 2018-03-21 | Discharge: 2018-03-21 | Disposition: A | Discharge status: Home / Self Care | Payer: Medicare Other | Source: Ambulatory Visit | Attending: Nephrology | Admitting: Nephrology

## 2018-03-21 VITALS — BP 125/58 | HR 77 | Temp 98.1°F | Resp 20

## 2018-03-21 DIAGNOSIS — Z5181 Encounter for therapeutic drug level monitoring: Secondary | ICD-10-CM | POA: Insufficient documentation

## 2018-03-21 DIAGNOSIS — D631 Anemia in chronic kidney disease: Secondary | ICD-10-CM | POA: Insufficient documentation

## 2018-03-21 DIAGNOSIS — Z79899 Other long term (current) drug therapy: Secondary | ICD-10-CM | POA: Insufficient documentation

## 2018-03-21 DIAGNOSIS — N183 Chronic kidney disease, stage 3 unspecified: Secondary | ICD-10-CM

## 2018-03-21 LAB — POCT HEMOGLOBIN-HEMACUE: Hemoglobin: 11.4 g/dL — ABNORMAL LOW (ref 12.0–15.0)

## 2018-03-21 LAB — FERRITIN: Ferritin: 1205 ng/mL — ABNORMAL HIGH (ref 11–307)

## 2018-03-21 LAB — IRON AND TIBC
IRON: 57 ug/dL (ref 28–170)
SATURATION RATIOS: 28 % (ref 10.4–31.8)
TIBC: 207 ug/dL — ABNORMAL LOW (ref 250–450)
UIBC: 150 ug/dL

## 2018-03-21 MED ORDER — EPOETIN ALFA 10000 UNIT/ML IJ SOLN
INTRAMUSCULAR | Status: AC
  Administered 2018-03-21: 10000 [IU] via SUBCUTANEOUS
  Filled 2018-03-21: qty 1

## 2018-03-21 MED ORDER — EPOETIN ALFA 10000 UNIT/ML IJ SOLN
10000.0000 [IU] | INTRAMUSCULAR | Status: DC
  Administered 2018-03-21: 10000 [IU] via SUBCUTANEOUS

## 2018-03-25 ENCOUNTER — Other Ambulatory Visit: Payer: Self-pay | Admitting: Endocrinology

## 2018-03-25 NOTE — Telephone Encounter (Signed)
Ok to refill 

## 2018-03-25 NOTE — Telephone Encounter (Signed)
Please refill x 3 months Further refills would have to be considered by new PCP   

## 2018-03-29 DIAGNOSIS — I10 Essential (primary) hypertension: Secondary | ICD-10-CM | POA: Diagnosis not present

## 2018-03-29 DIAGNOSIS — R600 Localized edema: Secondary | ICD-10-CM | POA: Diagnosis not present

## 2018-03-29 DIAGNOSIS — N183 Chronic kidney disease, stage 3 (moderate): Secondary | ICD-10-CM | POA: Diagnosis not present

## 2018-03-29 DIAGNOSIS — E119 Type 2 diabetes mellitus without complications: Secondary | ICD-10-CM | POA: Diagnosis not present

## 2018-03-30 ENCOUNTER — Ambulatory Visit
Admission: RE | Admit: 2018-03-30 | Discharge: 2018-03-30 | Disposition: A | Discharge status: Home / Self Care | Payer: Medicare Other | Source: Ambulatory Visit | Attending: Endocrinology | Admitting: Endocrinology

## 2018-03-30 ENCOUNTER — Other Ambulatory Visit: Payer: Self-pay | Admitting: Endocrinology

## 2018-03-30 DIAGNOSIS — R599 Enlarged lymph nodes, unspecified: Secondary | ICD-10-CM

## 2018-03-30 DIAGNOSIS — N6310 Unspecified lump in the right breast, unspecified quadrant: Secondary | ICD-10-CM

## 2018-03-30 DIAGNOSIS — N6311 Unspecified lump in the right breast, upper outer quadrant: Secondary | ICD-10-CM | POA: Diagnosis not present

## 2018-03-30 DIAGNOSIS — N6314 Unspecified lump in the right breast, lower inner quadrant: Secondary | ICD-10-CM | POA: Diagnosis not present

## 2018-03-30 DIAGNOSIS — C50411 Malignant neoplasm of upper-outer quadrant of right female breast: Secondary | ICD-10-CM | POA: Diagnosis not present

## 2018-03-30 DIAGNOSIS — R590 Localized enlarged lymph nodes: Secondary | ICD-10-CM | POA: Diagnosis not present

## 2018-03-30 HISTORY — PX: BREAST BIOPSY: SHX20

## 2018-04-03 ENCOUNTER — Encounter: Payer: Self-pay | Admitting: General Surgery

## 2018-04-04 DIAGNOSIS — Z9049 Acquired absence of other specified parts of digestive tract: Secondary | ICD-10-CM | POA: Diagnosis not present

## 2018-04-04 DIAGNOSIS — A0472 Enterocolitis due to Clostridium difficile, not specified as recurrent: Secondary | ICD-10-CM | POA: Diagnosis not present

## 2018-04-04 DIAGNOSIS — C50411 Malignant neoplasm of upper-outer quadrant of right female breast: Secondary | ICD-10-CM | POA: Diagnosis not present

## 2018-04-04 DIAGNOSIS — C50911 Malignant neoplasm of unspecified site of right female breast: Secondary | ICD-10-CM | POA: Diagnosis not present

## 2018-04-04 DIAGNOSIS — N2581 Secondary hyperparathyroidism of renal origin: Secondary | ICD-10-CM | POA: Diagnosis not present

## 2018-04-04 DIAGNOSIS — Z6841 Body Mass Index (BMI) 40.0 and over, adult: Secondary | ICD-10-CM | POA: Diagnosis not present

## 2018-04-04 DIAGNOSIS — E039 Hypothyroidism, unspecified: Secondary | ICD-10-CM | POA: Diagnosis not present

## 2018-04-04 DIAGNOSIS — I2510 Atherosclerotic heart disease of native coronary artery without angina pectoris: Secondary | ICD-10-CM | POA: Diagnosis not present

## 2018-04-04 DIAGNOSIS — Z98891 History of uterine scar from previous surgery: Secondary | ICD-10-CM | POA: Diagnosis not present

## 2018-04-04 DIAGNOSIS — C773 Secondary and unspecified malignant neoplasm of axilla and upper limb lymph nodes: Secondary | ICD-10-CM | POA: Diagnosis not present

## 2018-04-04 DIAGNOSIS — E119 Type 2 diabetes mellitus without complications: Secondary | ICD-10-CM | POA: Diagnosis not present

## 2018-04-04 DIAGNOSIS — Z96652 Presence of left artificial knee joint: Secondary | ICD-10-CM | POA: Diagnosis not present

## 2018-04-05 ENCOUNTER — Other Ambulatory Visit: Payer: Self-pay | Admitting: General Surgery

## 2018-04-05 ENCOUNTER — Telehealth: Payer: Self-pay | Admitting: *Deleted

## 2018-04-05 DIAGNOSIS — C50911 Malignant neoplasm of unspecified site of right female breast: Secondary | ICD-10-CM

## 2018-04-05 NOTE — Telephone Encounter (Signed)
   [facility] Pre-operative Risk Assessment    Request for surgical clearance:  1. What type of surgery is being performed? RIGHT BREAST MASTECTOMY   2. When is this surgery scheduled? TBD   3. What type of clearance is required (medical clearance vs. Pharmacy clearance to hold med vs. Both)? MEDICAL  4. Are there any medications that need to be held prior to surgery and how long?ASA   5. Practice name and name of physician performing surgery? [facility]   6. What is your office phone number [phone]    7.   What is your office fax number       [phone]  8.   Anesthesia type (None, local, MAC, general) ? GENERAL    Stacy Walls 04/05/2018, 9:23 AM  _________________________________________________________________   (provider comments below)

## 2018-04-06 NOTE — Telephone Encounter (Addendum)
   Primary Cardiologist:No primary care provider on file.  Chart reviewed as part of pre-operative protocol coverage. Because of Stacy Walls's past medical history and time since last visit, he/she will require a follow-up visit in order to better assess preoperative cardiovascular risk.  Last OV > 1 yr ago, 07/2016.  Pre-op covering staff: - Please schedule appointment and call patient to inform them. - Please contact requesting surgeon's office via preferred method (i.e, phone, fax) to inform them of need for appointment prior to surgery.  If applicable, this message will also be routed to pharmacy pool and/or primary cardiologist for input on holding anticoagulant/antiplatelet agent as requested below so that this information is available at time of patient's appointment. Dr. Marlou Porch - ASA. - Please route response to [facility] PREOP (the pre-op pool). Thank you.   Charlie Pitter, PA-C  04/06/2018, 5:18 PM

## 2018-04-07 ENCOUNTER — Telehealth: Payer: Self-pay | Admitting: Cardiology

## 2018-04-07 NOTE — Telephone Encounter (Signed)
ASA may be held if necessary for surgery 5 days prior.  Candee Furbish, MD

## 2018-04-07 NOTE — Telephone Encounter (Signed)
Pt is scheduled to see [name] PA on 04/12/18. Clearance will be addressed at visit.

## 2018-04-07 NOTE — Telephone Encounter (Signed)
New Message:    Patient calling to get apt for pre-op surgery on the 04/21/17 she needs to see the dr. To be cleared. I could not fine anything. Please call patient

## 2018-04-07 NOTE — Telephone Encounter (Signed)
Pt has been scheduled for 12/24 with [name], PA for preop clearance

## 2018-04-08 ENCOUNTER — Inpatient Hospital Stay: Payer: Medicare Other | Attending: Hematology | Admitting: Hematology

## 2018-04-08 ENCOUNTER — Inpatient Hospital Stay: Payer: Medicare Other

## 2018-04-08 ENCOUNTER — Encounter: Payer: Self-pay | Admitting: Hematology

## 2018-04-08 DIAGNOSIS — E119 Type 2 diabetes mellitus without complications: Secondary | ICD-10-CM | POA: Insufficient documentation

## 2018-04-08 DIAGNOSIS — M1990 Unspecified osteoarthritis, unspecified site: Secondary | ICD-10-CM | POA: Insufficient documentation

## 2018-04-08 DIAGNOSIS — Z79899 Other long term (current) drug therapy: Secondary | ICD-10-CM | POA: Diagnosis not present

## 2018-04-08 DIAGNOSIS — Z170 Estrogen receptor positive status [ER+]: Secondary | ICD-10-CM | POA: Diagnosis not present

## 2018-04-08 DIAGNOSIS — D509 Iron deficiency anemia, unspecified: Secondary | ICD-10-CM | POA: Diagnosis not present

## 2018-04-08 DIAGNOSIS — N183 Chronic kidney disease, stage 3 (moderate): Secondary | ICD-10-CM | POA: Diagnosis not present

## 2018-04-08 DIAGNOSIS — I10 Essential (primary) hypertension: Secondary | ICD-10-CM | POA: Diagnosis not present

## 2018-04-08 DIAGNOSIS — C50111 Malignant neoplasm of central portion of right female breast: Secondary | ICD-10-CM | POA: Insufficient documentation

## 2018-04-08 DIAGNOSIS — R0602 Shortness of breath: Secondary | ICD-10-CM | POA: Insufficient documentation

## 2018-04-08 LAB — CBC WITH DIFFERENTIAL (CANCER CENTER ONLY)
ABS IMMATURE GRANULOCYTES: 0.01 10*3/uL (ref 0.00–0.07)
Basophils Absolute: 0 10*3/uL (ref 0.0–0.1)
Basophils Relative: 1 %
Eosinophils Absolute: 0.2 10*3/uL (ref 0.0–0.5)
Eosinophils Relative: 3 %
HCT: 34.3 % — ABNORMAL LOW (ref 36.0–46.0)
HEMOGLOBIN: 11.3 g/dL — AB (ref 12.0–15.0)
Immature Granulocytes: 0 %
LYMPHS ABS: 1.5 10*3/uL (ref 0.7–4.0)
Lymphocytes Relative: 26 %
MCH: 31.1 pg (ref 26.0–34.0)
MCHC: 32.9 g/dL (ref 30.0–36.0)
MCV: 94.5 fL (ref 80.0–100.0)
Monocytes Absolute: 0.8 10*3/uL (ref 0.1–1.0)
Monocytes Relative: 14 %
Neutro Abs: 3.2 10*3/uL (ref 1.7–7.7)
Neutrophils Relative %: 56 %
Platelet Count: 153 10*3/uL (ref 150–400)
RBC: 3.63 MIL/uL — ABNORMAL LOW (ref 3.87–5.11)
RDW: 12.5 % (ref 11.5–15.5)
WBC Count: 5.7 10*3/uL (ref 4.0–10.5)
nRBC: 0 % (ref 0.0–0.2)

## 2018-04-08 LAB — CMP (CANCER CENTER ONLY)
ALBUMIN: 3.5 g/dL (ref 3.5–5.0)
ALT: 15 U/L (ref 0–44)
AST: 20 U/L (ref 15–41)
Alkaline Phosphatase: 95 U/L (ref 38–126)
Anion gap: 8 (ref 5–15)
BUN: 22 mg/dL (ref 8–23)
CO2: 27 mmol/L (ref 22–32)
Calcium: 10.5 mg/dL — ABNORMAL HIGH (ref 8.9–10.3)
Chloride: 95 mmol/L — ABNORMAL LOW (ref 98–111)
Creatinine: 1.56 mg/dL — ABNORMAL HIGH (ref 0.44–1.00)
GFR, Est AFR Am: 35 mL/min — ABNORMAL LOW (ref >60)
GFR, Estimated: 30 mL/min — ABNORMAL LOW (ref >60)
Glucose, Bld: 111 mg/dL — ABNORMAL HIGH (ref 70–99)
Potassium: 4.6 mmol/L (ref 3.5–5.1)
Sodium: 130 mmol/L — ABNORMAL LOW (ref 135–145)
Total Bilirubin: 0.7 mg/dL (ref 0.3–1.2)
Total Protein: 6.6 g/dL (ref 6.5–8.1)

## 2018-04-08 NOTE — Progress Notes (Signed)
[place]  [phone]) [phone] Fax:([phone]) [place] Note   Patient Care Team: Tsosie Billing, MD as PCP - General (Internal Medicine) Edrick Oh, MD (Nephrology) Verl Blalock, Marijo Conception, MD (Inactive) (Cardiology) Maia Breslow, MD (Orthopedic Surgery) Newton Pigg, MD as Consulting Physician (Obstetrics and Gynecology)   Date of Service:  04/08/2018  REFERRAL PHYSICIAN: Dr. Dalbert Batman   CHIEF COMPLAINTS/PURPOSE OF CONSULTATION:  Newly diagnosed right breast cancer    Cancer of central portion of right female breast ([place])   01/13/2018 Mammogram    Diagnostic Mammogram 01/13/18  RECOMMENDATION: 1. Ultrasound-guided biopsies of both masses in the RIGHT breast identified by ultrasound at the 12 o'clock axis (7 cm from the nipple measuring 1.1 x 0.8 x 0.7 cm and 5 cm from the nipple measuring 1 x 0.8 x 0.9 cm respectively). 2. Ultrasound-guided biopsy of the most prominent lymph node in the RIGHT axilla. 3. Postprocedure mammogram to ensure that the masses correspond to the extent of the asymmetry/distortion seen on mammogram. Additional stereotactic biopsy may be needed if the clips do not approximate the anterior and posterior extents. 4. Depending on the location of the post biopsy clips, and if breast conservation surgery is considered, stereotactic biopsy may be also needed for the coarse heterogeneous calcifications within the slightly outer RIGHT breast, measuring 8 mm extent, located 1.2 cm from the asymmetry/distortion.    03/30/2018 Cancer Staging    Staging form: Breast, AJCC 8th Edition - Clinical stage from 03/30/2018: Stage IB (cT1c(m), cN1, cM0, G2, ER+, PR+, HER2-) - Signed by Truitt Merle, MD on 04/08/2018    03/30/2018 Initial Biopsy    Diagnosis 03/30/18 1. Breast, right, needle core biopsy, 12 o'clock position, 5cm from nipple - INVASIVE LOBULAR CARCINOMA, GRADE 2. SEE NOTE. - LOBULAR CARCINOMA IN SITU, INTERMEDIATE  NUCLEAR GRADE. 2. Breast, right, needle core biopsy, 12 o'clock position 7cfn - INVASIVE LOBULAR CARCINOMA, GRADE 2. SEE NOTE. 1 of 3 FINAL for Diffee, Tariana R ([id number]) Diagnosis(continued) - LOBULAR CARCINOMA IN SITU, INTERMEDIATE NUCLEAR GRADE. 3. Lymph node, needle/core biopsy, right axilla - METASTATIC LOBULAR CARCINOMA TO LYMPH NODE.    03/30/2018 Receptors her2    Results: IMMUNOHISTOCHEMICAL AND MORPHOMETRIC ANALYSIS PERFORMED MANUALLY The tumor cells are NEGATIVE for Her2 (1+). Estrogen Receptor: 100%, POSITIVE, STRONG STAINING INTENSITY Progesterone Receptor: 70%, POSITIVE, STRONG STAINING INTENSITY Proliferation Marker Ki67: 10%    04/08/2018 Initial Diagnosis    Cancer of central portion of right female breast ([place])     HISTORY OF PRESENTING ILLNESS:  Stacy Walls 84 y.o. female is a here because of newly diagnosed right breast cancer. The patient was referred by Dr. Dalbert Batman. The patient presents to the clinic today accompanied by daughter and son.   She did not have a mammogram in 12 years, prior to that she was going yearly. She felt lump in right breast 6 months ago with no change. She had mammogram in 12/2017 which prompted more workup. She waited for her biopsy which she had this month and confirmed cancer. She denies any other change in breast, skin or weight. She denies any new pain. She no longer feels lump.   Today she notes adequate appetite and gets tired at time.  She ambulates with cane from arthritis and occasional SOB.   Socially she is married and lives with husband. She has 2 children. She used to work in Scientist, research (medical) at [employer], now she is retired. She does not smoke or drink. She notes she is cooks, go  out the house and her husband drives. He has CHF. She overall is independent.  They have a PMHx of arthritis and occasional SOB, she ambulates with cane. She has CKD Stage III seen by Dr. Verl Blalock. She has DM which is controlled. She had a h/o IDA. She  denies heart issues. She had Knee replacement, ankle surgery and gall bladder removed.  She denies family history breast cancer. Her brother had [place] from drinking.    GYN HISTORY  Menarchal: 11-12 HRT: No G2P2, she did not breast feed    REVIEW OF SYSTEMS:   Constitutional: Denies fevers, chills or abnormal night sweats Eyes: Denies blurriness of vision, double vision or watery eyes Ears, nose, mouth, throat, and face: Denies mucositis or sore throat Respiratory: Denies cough or wheezes (+) occasional SOB  Cardiovascular: Denies palpitation, chest discomfort or lower extremity swelling MSK: (+) OA, ambulates with cane  Gastrointestinal:  Denies nausea, heartburn or change in bowel habits Skin: Denies abnormal skin rashes Lymphatics: Denies new lymphadenopathy or easy bruising Neurological:Denies numbness, tingling or new weaknesses Behavioral/Psych: Mood is stable, no new changes  All other systems were reviewed with the patient and are negative.   MEDICAL HISTORY:  Past Medical History:  Diagnosis Date  . Acute cholecystitis   . Allergic rhinitis   . Anemia   . Chest pain   . Chronic renal insufficiency   . Clostridium difficile colitis   . Coronary atherosclerosis of native coronary vessel   . Cough   . Diabetes mellitus   . Dizziness   . DM2 (diabetes mellitus, type 2) ([place])   . Dyshidrosis   . GERD (gastroesophageal reflux disease)   . Gout   . HLD (hyperlipidemia)   . HTN (hypertension)   . Hypothyroidism   . Neck mass   . Neck pain   . Osteoporosis   . Routine general medical examination at a health care facility   . Secondary hyperparathyroidism (of renal origin)   . Urinary incontinence   . Urinary tract infection     SURGICAL HISTORY: Past Surgical History:  Procedure Laterality Date  . bone density  08/21/05  . C-section (other)  1977  . CHOLECYSTECTOMY    . ELECTROCARDIOGRAM  02/01/06  . L ankle surgery  1988  . total left knee  2007     SOCIAL HISTORY: Social History   Socioeconomic History  . Marital status: Married    Spouse name: Not on file  . Number of children: Not on file  . Years of education: Not on file  . Highest education level: Not on file  Occupational History  . Occupation: retired   Scientific laboratory technician  . Financial resource strain: Not on file  . Food insecurity:    Worry: Not on file    Inability: Not on file  . Transportation needs:    Medical: Not on file    Non-medical: Not on file  Tobacco Use  . Smoking status: Never Smoker  . Smokeless tobacco: Never Used  Substance and Sexual Activity  . Alcohol use: No  . Drug use: No  . Sexual activity: Not on file  Lifestyle  . Physical activity:    Days per week: Not on file    Minutes per session: Not on file  . Stress: Not on file  Relationships  . Social connections:    Talks on phone: Not on file    Gets together: Not on file    Attends religious service: Not on file  Active member of club or organization: Not on file    Attends meetings of clubs or organizations: Not on file    Relationship status: Not on file  . Intimate partner violence:    Fear of current or ex partner: Not on file    Emotionally abused: Not on file    Physically abused: Not on file    Forced sexual activity: Not on file  Other Topics Concern  . Not on file  Social History Narrative   Lives with husband and has lived in [place] for over 30 years.     FAMILY HISTORY: Family History  Problem Relation Age of Onset  . Coronary artery disease Father   . Stroke Father   . Diabetes Father   . Diabetes Sister   . Stroke Sister   . Diabetes Brother   . Cancer Brother        liver caner  . Breast cancer Neg Hx     ALLERGIES:  is allergic to ace inhibitors; azithromycin; pioglitazone; sulfonamide derivatives; and tramadol.  MEDICATIONS:  Current Outpatient Medications  Medication Sig Dispense Refill  . allopurinol (ZYLOPRIM) 300 MG tablet Take 1 tablet  (300 mg total) by mouth daily. 90 tablet 3  . diclofenac sodium (VOLTAREN) 1 % GEL APPLY 2 GRAMS EXTERNALLY TO THE AFFECTED AREA FOUR TIMES DAILY 100 g 0  . epoetin alfa (PROCRIT) 16109 UNIT/ML injection Inject 10,000 Units into the skin every 30 (thirty) days. Dr. Justin Mend    . Ferrous Sulfate (IRON) 325 (65 FE) MG TABS Take 1 tablet by mouth daily.     . furosemide (LASIX) 20 MG tablet Take 1 tablet (20 mg total) by mouth daily. 90 tablet 3  . hydrOXYzine (VISTARIL) 25 MG capsule Take 25 mg by mouth at bedtime as needed (1 to 2 at bedtime as needed for sleep).    Marland Kitchen levothyroxine (SYNTHROID, LEVOTHROID) 88 MCG tablet Take 1 tablet (88 mcg total) by mouth daily. 90 tablet 3  . losartan-hydrochlorothiazide (HYZAAR) 100-25 MG tablet Take 1 tablet by mouth daily. 90 tablet 3  . Multiple Vitamins-Minerals (MULTIVITAMIN WOMEN 50+ PO) Take by mouth.    Marland Kitchen omeprazole (PRILOSEC) 20 MG capsule TAKE 1 CAPSULE(20 MG) BY MOUTH DAILY 90 capsule 0  . oxybutynin (DITROPAN) 5 MG tablet 1 tab daily 90 tablet 3  . sitaGLIPtin (JANUVIA) 100 MG tablet TAKE 1/2 TABLET(50 MG) BY MOUTH DAILY 45 tablet 3  . triamcinolone ointment (KENALOG) 0.1 % APPLY TOPICALLY TO THE AFFECTED AREA THREE TIMES DAILY AS NEEDED FOR ITCHING 454 g 0  . vitamin B-12 (CYANOCOBALAMIN) 500 MCG tablet Take 500 mcg by mouth daily.    . vitamin C (ASCORBIC ACID) 500 MG tablet Take 500 mg by mouth daily.     No current facility-administered medications for this visit.     PHYSICAL EXAMINATION: ECOG PERFORMANCE STATUS: 3 - Symptomatic, >50% confined to bed  Vitals:   04/08/18 1449  BP: (!) 126/50  Pulse: 73  Resp: 20  Temp: 98.7 F (37.1 C)  SpO2: 100%   Filed Weights   04/08/18 1449  Weight: 247 lb (112 kg)    GENERAL:alert, no distress and comfortable SKIN: skin color, texture, turgor are normal, no rashes or significant lesions EYES: normal, conjunctiva are pink and non-injected, sclera clear OROPHARYNX:no exudate, no erythema and  lips, buccal mucosa, and tongue normal  NECK: supple, thyroid normal size, non-tender, without nodularity LYMPH:  no palpable lymphadenopathy in the cervical, axillary or inguinal LUNGS: clear to  auscultation and percussion with normal breathing effort HEART: regular rate & rhythm and no murmurs (+)b/l lower extremity edema ABDOMEN:abdomen soft, non-tender and normal bowel sounds Musculoskeletal:no cyanosis of digits and no clubbing  PSYCH: alert & oriented x 3 with fluent speech NEURO: no focal motor/sensory deficits BREAST: Breast inspection showed them to be symmetrical with no nipple discharge. Palpation of the breasts and axilla revealed no obvious mass that I could appreciate.   LABORATORY DATA:  I have reviewed the data as listed CBC Latest Ref Rng & Units 04/08/2018 03/21/2018 02/21/2018  WBC 4.0 - 10.5 K/uL 5.7 - -  Hemoglobin 12.0 - 15.0 g/dL 11.3(L) 11.4(L) 11.3(L)  Hematocrit 36.0 - 46.0 % 34.3(L) - -  Platelets 150 - 400 K/uL 153 - -    CMP Latest Ref Rng & Units 04/08/2018 09/07/2017 09/07/2017  Glucose 70 - 99 mg/dL 111(H) - 165(H)  BUN 8 - 23 mg/dL 22 - 29(H)  Creatinine 0.44 - 1.00 mg/dL 1.56(H) - 1.75(H)  Sodium 135 - 145 mmol/L 130(L) - 136  Potassium 3.5 - 5.1 mmol/L 4.6 - 4.2  Chloride 98 - 111 mmol/L 95(L) - 98  CO2 22 - 32 mmol/L 27 - 32  Calcium 8.9 - 10.3 mg/dL 10.5(H) 10.7(H) 10.7(H)  Total Protein 6.5 - 8.1 g/dL 6.6 - 6.9  Total Bilirubin 0.3 - 1.2 mg/dL 0.7 - 0.7  Alkaline Phos 38 - 126 U/L 95 - 86  AST 15 - 41 U/L 20 - 20  ALT 0 - 44 U/L 15 - 17     RADIOGRAPHIC STUDIES: I have personally reviewed the radiological images as listed and agreed with the findings in the report. [place] Axillary Node Core Biopsy Right  Addendum Date: 04/01/2018   ADDENDUM REPORT: 04/01/2018 08:00 ADDENDUM: Pathology revealed GRADE II INVASIVE LOBULAR CARCINOMA, INTERMEDIATE NUCLEAR GRADE LOBULAR CARCINOMA IN SITU of the RIGHT breast, BOTH locations, 12 o'clock position, 5cmfn  and 12 o'clock position 7 cmfn. This was found to be concordant by Dr. Hassan Rowan. Pathology revealed METASTATIC LOBULAR CARCINOMA of the RIGHT axillary lymph node. This was found to be concordant by Dr. Hassan Rowan. Pathology results were discussed with the patient by telephone. The patient reported doing well after the biopsies with tenderness at the sites. Post biopsy instructions and care were reviewed and questions were answered. The patient was encouraged to call [facility] for any additional concerns. Surgical consultation has been arranged with Dr. Fanny Skates at [facility] on April 04, 2018. Recommendation for a bilateral breast MRI given the lobular histology. Pathology results reported by Terie Purser, RN on 04/01/2018. Electronically Signed   By: Margarette Canada M.D.   On: 04/01/2018 08:00   Result Date: 04/01/2018 CLINICAL DATA:  84 year old female for tissue sampling of 2 suspicious masses within the UPPER RIGHT breast and an enlarged RIGHT axillary lymph node. EXAM: ULTRASOUND GUIDED RIGHT BREAST CORE NEEDLE BIOPSY x 2 ULTRASOUND GUIDED CORE NEEDLE BIOPSY OF A RIGHT AXILLARY NODE COMPARISON:  Previous exam(s). FINDINGS: I met with the patient and we discussed the procedure of ultrasound-guided biopsy, including benefits and alternatives. We discussed the high likelihood of a successful procedure. We discussed the risks of the procedure, including infection, bleeding, tissue injury, clip migration, and inadequate sampling. Informed written consent was given. The usual time-out protocol was performed immediately prior to the procedure. ULTRASOUND-GUIDED RIGHT BREAST BIOPSY (12 O'CLOCK POSITION 5 CM FROM THE NIPPLE: RIBBON CLIP): Using sterile technique and 1% Lidocaine as local anesthetic,  under direct ultrasound visualization, a 12 gauge spring-loaded device was used to perform biopsy of the 1 cm mass at the 12 o'clock position of the RIGHT breast 5 cm from the  nipple using a LATERAL approach. At the conclusion of the procedure a RIBBON tissue marker clip was deployed into the biopsy cavity. Follow up 2 view mammogram was performed and dictated separately. ULTRASOUND-GUIDED RIGHT BREAST BIOPSY (12 O'CLOCK POSITION 7 CM FROM THE NIPPLE: COIL CLIP): Using sterile technique and 1% Lidocaine as local anesthetic, under direct ultrasound visualization, a 12 gauge spring-loaded device was used to perform biopsy of the 1.1 cm mass at the 12 o'clock position of the RIGHT breast 7 cm from the nipple using a LATERAL approach. At the conclusion of the procedure a COIL tissue marker clip was deployed into the biopsy cavity. Follow up 2 view mammogram was performed and dictated separately. ULTRASOUND-GUIDED RIGHT AXILLARY LYMPH NODE BIOPSY (HYDROMARK CLIP): Using sterile technique and 1% Lidocaine as local anesthetic, under direct ultrasound visualization, a 14 gauge spring-loaded device was used to perform biopsy of 1 of the enlarged RIGHT axillary lymph nodes using a INFERIOR approach. At the conclusion of the procedure a HydroMARK tissue marker clip was deployed into the biopsy cavity, with placement confirmed sonographically. Follow up 2 view mammogram was performed and dictated separately. IMPRESSION: Ultrasound guided biopsy of the 1 cm mass and the 1.1 cm mass at the 12 o'clock position of the RIGHT breast and an enlarged RIGHT axillary lymph node. No apparent complications. Electronically Signed: By: Margarette Canada M.D. On: 03/30/2018 15:22   Mm Clip Placement Right  Result Date: 03/30/2018 CLINICAL DATA:  Evaluate placement of biopsy clips following 2 ultrasound-guided RIGHT breast biopsies and ultrasound-guided RIGHT axillary biopsy. EXAM: DIAGNOSTIC RIGHT MAMMOGRAM POST ULTRASOUND BIOPSY COMPARISON:  Previous exam(s). FINDINGS: Mammographic images were obtained following ultrasound guided of a 1 cm mass at the 12 o'clock position 5 cm from the nipple, 1.1 cm mass at the 12  o'clock position 7 cm from the nipple and an enlarged RIGHT axillary lymph node. The RIBBON clip is in satisfactory position corresponding to the 1 cm RIGHT breast mass biopsied at the 12 o'clock position 5 cm from the nipple. The COIL clip is in satisfactory position corresponding to the 1.1 cm RIGHT breast mass biopsied at the 12 o'clock position 7 cm from the nipple. [facility] clip is in satisfactory position within an enlarged RIGHT axillary lymph node. The RIBBON and COIL clips are separated by a distance of 2.5 cm in the craniocaudal dimension. IMPRESSION: Satisfactory position of all 3 biopsy clips following 2 RIGHT breast biopsies and RIGHT axillary lymph node biopsy. The RIBBON and COIL clips within the RIGHT breast are separated by a distance of 2.5 cm Final Assessment: Post Procedure Mammograms for Marker Placement Electronically Signed   By: Margarette Canada M.D.   On: 03/30/2018 15:45   [place] Rt Breast Bx W Loc Dev 1st Lesion Img Bx Spec US Guide  Addendum Date: 04/01/2018   ADDENDUM REPORT: 04/01/2018 08:00 ADDENDUM: Pathology revealed GRADE II INVASIVE LOBULAR CARCINOMA, INTERMEDIATE NUCLEAR GRADE LOBULAR CARCINOMA IN SITU of the RIGHT breast, BOTH locations, 12 o'clock position, 5cmfn and 12 o'clock position 7 cmfn. This was found to be concordant by Dr. Hassan Rowan. Pathology revealed METASTATIC LOBULAR CARCINOMA of the RIGHT axillary lymph node. This was found to be concordant by Dr. Hassan Rowan. Pathology results were discussed with the patient by telephone. The patient reported doing well after the biopsies  with tenderness at the sites. Post biopsy instructions and care were reviewed and questions were answered. The patient was encouraged to call [facility] for any additional concerns. Surgical consultation has been arranged with Dr. Fanny Skates at [facility] on April 04, 2018. Recommendation for a bilateral breast MRI given the lobular histology.  Pathology results reported by Terie Purser, RN on 04/01/2018. Electronically Signed   By: Margarette Canada M.D.   On: 04/01/2018 08:00   Result Date: 04/01/2018 CLINICAL DATA:  84 year old female for tissue sampling of 2 suspicious masses within the UPPER RIGHT breast and an enlarged RIGHT axillary lymph node. EXAM: ULTRASOUND GUIDED RIGHT BREAST CORE NEEDLE BIOPSY x 2 ULTRASOUND GUIDED CORE NEEDLE BIOPSY OF A RIGHT AXILLARY NODE COMPARISON:  Previous exam(s). FINDINGS: I met with the patient and we discussed the procedure of ultrasound-guided biopsy, including benefits and alternatives. We discussed the high likelihood of a successful procedure. We discussed the risks of the procedure, including infection, bleeding, tissue injury, clip migration, and inadequate sampling. Informed written consent was given. The usual time-out protocol was performed immediately prior to the procedure. ULTRASOUND-GUIDED RIGHT BREAST BIOPSY (12 O'CLOCK POSITION 5 CM FROM THE NIPPLE: RIBBON CLIP): Using sterile technique and 1% Lidocaine as local anesthetic, under direct ultrasound visualization, a 12 gauge spring-loaded device was used to perform biopsy of the 1 cm mass at the 12 o'clock position of the RIGHT breast 5 cm from the nipple using a LATERAL approach. At the conclusion of the procedure a RIBBON tissue marker clip was deployed into the biopsy cavity. Follow up 2 view mammogram was performed and dictated separately. ULTRASOUND-GUIDED RIGHT BREAST BIOPSY (12 O'CLOCK POSITION 7 CM FROM THE NIPPLE: COIL CLIP): Using sterile technique and 1% Lidocaine as local anesthetic, under direct ultrasound visualization, a 12 gauge spring-loaded device was used to perform biopsy of the 1.1 cm mass at the 12 o'clock position of the RIGHT breast 7 cm from the nipple using a LATERAL approach. At the conclusion of the procedure a COIL tissue marker clip was deployed into the biopsy cavity. Follow up 2 view mammogram was performed and dictated  separately. ULTRASOUND-GUIDED RIGHT AXILLARY LYMPH NODE BIOPSY (HYDROMARK CLIP): Using sterile technique and 1% Lidocaine as local anesthetic, under direct ultrasound visualization, a 14 gauge spring-loaded device was used to perform biopsy of 1 of the enlarged RIGHT axillary lymph nodes using a INFERIOR approach. At the conclusion of the procedure a HydroMARK tissue marker clip was deployed into the biopsy cavity, with placement confirmed sonographically. Follow up 2 view mammogram was performed and dictated separately. IMPRESSION: Ultrasound guided biopsy of the 1 cm mass and the 1.1 cm mass at the 12 o'clock position of the RIGHT breast and an enlarged RIGHT axillary lymph node. No apparent complications. Electronically Signed: By: Margarette Canada M.D. On: 03/30/2018 15:22   [place] Rt Breast Bx W Loc Dev Ea Add Lesion Img Bx Spec US Guide  Addendum Date: 04/01/2018   ADDENDUM REPORT: 04/01/2018 08:00 ADDENDUM: Pathology revealed GRADE II INVASIVE LOBULAR CARCINOMA, INTERMEDIATE NUCLEAR GRADE LOBULAR CARCINOMA IN SITU of the RIGHT breast, BOTH locations, 12 o'clock position, 5cmfn and 12 o'clock position 7 cmfn. This was found to be concordant by Dr. Hassan Rowan. Pathology revealed METASTATIC LOBULAR CARCINOMA of the RIGHT axillary lymph node. This was found to be concordant by Dr. Hassan Rowan. Pathology results were discussed with the patient by telephone. The patient reported doing well after the biopsies with tenderness at the sites.  Post biopsy instructions and care were reviewed and questions were answered. The patient was encouraged to call [facility] for any additional concerns. Surgical consultation has been arranged with Dr. Fanny Skates at [facility] on April 04, 2018. Recommendation for a bilateral breast MRI given the lobular histology. Pathology results reported by Terie Purser, RN on 04/01/2018. Electronically Signed   By: Margarette Canada M.D.   On: 04/01/2018 08:00     Result Date: 04/01/2018 CLINICAL DATA:  84 year old female for tissue sampling of 2 suspicious masses within the UPPER RIGHT breast and an enlarged RIGHT axillary lymph node. EXAM: ULTRASOUND GUIDED RIGHT BREAST CORE NEEDLE BIOPSY x 2 ULTRASOUND GUIDED CORE NEEDLE BIOPSY OF A RIGHT AXILLARY NODE COMPARISON:  Previous exam(s). FINDINGS: I met with the patient and we discussed the procedure of ultrasound-guided biopsy, including benefits and alternatives. We discussed the high likelihood of a successful procedure. We discussed the risks of the procedure, including infection, bleeding, tissue injury, clip migration, and inadequate sampling. Informed written consent was given. The usual time-out protocol was performed immediately prior to the procedure. ULTRASOUND-GUIDED RIGHT BREAST BIOPSY (12 O'CLOCK POSITION 5 CM FROM THE NIPPLE: RIBBON CLIP): Using sterile technique and 1% Lidocaine as local anesthetic, under direct ultrasound visualization, a 12 gauge spring-loaded device was used to perform biopsy of the 1 cm mass at the 12 o'clock position of the RIGHT breast 5 cm from the nipple using a LATERAL approach. At the conclusion of the procedure a RIBBON tissue marker clip was deployed into the biopsy cavity. Follow up 2 view mammogram was performed and dictated separately. ULTRASOUND-GUIDED RIGHT BREAST BIOPSY (12 O'CLOCK POSITION 7 CM FROM THE NIPPLE: COIL CLIP): Using sterile technique and 1% Lidocaine as local anesthetic, under direct ultrasound visualization, a 12 gauge spring-loaded device was used to perform biopsy of the 1.1 cm mass at the 12 o'clock position of the RIGHT breast 7 cm from the nipple using a LATERAL approach. At the conclusion of the procedure a COIL tissue marker clip was deployed into the biopsy cavity. Follow up 2 view mammogram was performed and dictated separately. ULTRASOUND-GUIDED RIGHT AXILLARY LYMPH NODE BIOPSY (HYDROMARK CLIP): Using sterile technique and 1% Lidocaine as local  anesthetic, under direct ultrasound visualization, a 14 gauge spring-loaded device was used to perform biopsy of 1 of the enlarged RIGHT axillary lymph nodes using a INFERIOR approach. At the conclusion of the procedure a HydroMARK tissue marker clip was deployed into the biopsy cavity, with placement confirmed sonographically. Follow up 2 view mammogram was performed and dictated separately. IMPRESSION: Ultrasound guided biopsy of the 1 cm mass and the 1.1 cm mass at the 12 o'clock position of the RIGHT breast and an enlarged RIGHT axillary lymph node. No apparent complications. Electronically Signed: By: Margarette Canada M.D. On: 03/30/2018 15:22    DEXA 12/32/15:  Results:  Lumbar spine (L1-L4) Femoral neck (FN)  T-score - 0.7 RFN: - 2.6 LFN: - 1.4  Change in BMD from previous DXA test (%) - 0.8% + 10.7%*  (*) statistically significant Assessment: Patient has OSTEOPOROSIS according to the [facility] classification for osteoporosis (see below). Fracture risk: high   ASSESSMENT & PLAN:  Stacy Walls is a 84 y.o. African-American female with a history of arthritis, CKD Stage III, DM, HTN, h/o IDA, osteoporosis.    1. Cancer of central right breast, invasive carcinoma, stage IB, c (T1c(m)N1Mx), ER+/PR+, HER2-, Grade II -We discussed her image findings and the biopsy results in great  details. She has multifocal (2) invasive lobular carcinoma in right breast with 3 abnormal axillary nodes.  -Given the lobular histology, Dr. Dalbert Batman has ordered bilateral breast MRI, for further evaluation.  We discussed the possibility of more biopsy if MRI shows additional lesion.  -If MRI does not show additional lesion, she may be a candidate for lumpectomy, and right axillary lymph node dissection (giving 3 abnormal nodes). She already been seen by Dr. Dalbert Batman.  -Given the multiple focal disease, multiple abnormal lymph nodes, I recommend staging CT CAP and bone scan to rule out distant metastasis.  Her CKD, she  cannot receive IV CT contrast. -The risk of recurrence depends on the stage and biology of the tumor. She is IB stage, with ER/PR positive and HER2 Negative markers. I discussed this is the second most common type of slow growing tumor.  -Due to her advanced age, multiple comorbidities, and poor performance status, she is not a candidate for chemotherapy.  We also discussed the lobular carcinoma or less sensitive to chemotherapy, even if she has multiple positive axillary lymph node, the benefit of neoadjuvant or adjuvant chemotherapy may still be small. -She will consult with radiation oncologist.  Due to her multiple abnormal lymph nodes, she would benefit from adjuvant radiation to reduce her risk of local recurrence. -Giving the strong ER and PR expression in her postmenopausal status, I recommend adjuvant endocrine therapy with aromatase inhibitor for a total of 5-10 years to reduce the risk of cancer recurrence. Potential benefits and side effects were discussed with patient and she is interested. -We also discussed the breast cancer surveillance after her surgery. She will continue annual screening mammogram, self exam, and a routine office visit with lab and exam with [place]. -I encouraged her to have healthy diet and exercise regularly -Will do labs today for baseline.  -F/u open, likely after adjuvant radiation   2. Iron deficient anemia and anemia of chronic disease secondary to CKD  -She has been getting treated with monthly EPO injections by Dr. Justin Mend -On oral iron pill daily, continue  -She has not had IV iron before.  -I discussed stopping EPO as this can stimulate tumor growth. Will discuss this further with Dr. Justin Mend   3. CKD, stage III, HTN, DM -On Lasix, Hyzaar, Omeprazole, Januvia -HTN and DM overall controlled -Managed by PCP, nephrologist and cardiologist.   4. Osteoporsis, OA  -Seen on DEXA from 03/2014 with lowest T-score of -2.6 in femoral neck.  -She ambulates with cane     PLAN:  -Labs today  -CT CAP wo contrast and bone scan for staging, I will call her with the results  -will copy Dr. Dalbert Batman and Beverly Gust, and her PCP Dr. Marvel Plan  -F/u open      Orders Placed This Encounter  Procedures  . CT Abdomen Pelvis Wo Contrast    Standing Status:   Future    Standing Expiration Date:   04/08/2019    Order Specific Question:   Preferred imaging location?    Answer:   [facility]    Order Specific Question:   Is Oral Contrast requested for this exam?    Answer:   Yes, Per Radiology protocol    Order Specific Question:   Radiology Contrast Protocol - do NOT remove file path    Answer:   \\[email or web address]\CTProtocols.pdf  . CT Chest Wo Contrast    Standing Status:   Future    Standing Expiration Date:   04/08/2019    Order Specific  Question:   Preferred imaging location?    Answer:   [facility]    Order Specific Question:   Radiology Contrast Protocol - do NOT remove file path    Answer:   \\[email or web address]\CTProtocols.pdf  . NM Bone Scan Whole Body    Standing Status:   Future    Standing Expiration Date:   04/08/2019    Order Specific Question:   If indicated for the ordered procedure, I authorize the administration of a radiopharmaceutical per Radiology protocol    Answer:   Yes    Order Specific Question:   Preferred imaging location?    Answer:   [facility]    Order Specific Question:   Radiology Contrast Protocol - do NOT remove file path    Answer:   \\[email or web address]\Radiant\[email or web address].pdf  . CBC with Differential ([place] Only)    Standing Status:   Standing    Number of Occurrences:   50    Standing Expiration Date:   04/09/2023  . CMP ([place] only)    Standing Status:   Standing    Number of Occurrences:   50    Standing Expiration Date:   04/09/2023  . CA 27.29    Standing Status:   Standing    Number of Occurrences:   50    Standing Expiration Date:   04/09/2023     All questions were answered. The patient knows to call the clinic with any problems, questions or concerns. I spent 45 minutes counseling the patient face to face. The total time spent in the appointment was 50 minutes and more than 50% was on counseling.     Truitt Merle, MD 04/08/2018    I, Joslyn Devon, am acting as scribe for Truitt Merle, MD.   I have reviewed the above documentation for accuracy and completeness, and I agree with the above.

## 2018-04-09 LAB — CANCER ANTIGEN 27.29: CA 27.29: 40.9 U/mL — ABNORMAL HIGH (ref 0.0–38.6)

## 2018-04-11 ENCOUNTER — Encounter: Payer: Self-pay | Admitting: *Deleted

## 2018-04-11 ENCOUNTER — Telehealth: Payer: Self-pay | Admitting: Hematology

## 2018-04-11 NOTE — Telephone Encounter (Signed)
I spoke with pt's nephrologist Dr. Beverly Gust today, regarding her EPO injections.  Dr. Beverly Gust agrees to stop the injections, due to the concern of stimulating her breast cancer growth.  We discussed that due to the risk of late recurrence, we will avoid EPO injections for long term, unless her anemia gets much worse and requires frequent blood transfusions.   Truitt Merle  04/11/2018

## 2018-04-12 ENCOUNTER — Telehealth: Payer: Self-pay | Admitting: *Deleted

## 2018-04-12 ENCOUNTER — Encounter: Payer: Self-pay | Admitting: Physician Assistant

## 2018-04-12 ENCOUNTER — Ambulatory Visit (INDEPENDENT_AMBULATORY_CARE_PROVIDER_SITE_OTHER): Payer: Medicare Other | Admitting: Physician Assistant

## 2018-04-12 VITALS — BP 136/80 | HR 78 | Ht 62.0 in | Wt 242.1 lb

## 2018-04-12 DIAGNOSIS — N183 Chronic kidney disease, stage 3 unspecified: Secondary | ICD-10-CM

## 2018-04-12 DIAGNOSIS — Z01810 Encounter for preprocedural cardiovascular examination: Secondary | ICD-10-CM | POA: Diagnosis not present

## 2018-04-12 DIAGNOSIS — I10 Essential (primary) hypertension: Secondary | ICD-10-CM

## 2018-04-12 DIAGNOSIS — E1122 Type 2 diabetes mellitus with diabetic chronic kidney disease: Secondary | ICD-10-CM

## 2018-04-12 NOTE — Telephone Encounter (Signed)
Attempted to reach patient to go over lab results per Dr Ernestina Penna request.  LM for returned call to review.

## 2018-04-12 NOTE — Patient Instructions (Signed)
Medication Instructions:  Your physician recommends that you continue on your current medications as directed. Please refer to the Current Medication list given to you today.  If you need a refill on your cardiac medications before your next appointment, please call your pharmacy.   Lab work: NONE If you have labs (blood work) drawn today and your tests are completely normal, you will receive your results only by: Marland Kitchen MyChart Message (if you have MyChart) OR . A paper copy in the mail If you have any lab test that is abnormal or we need to change your treatment, we will call you to review the results.  Testing/Procedures: NONE  Follow-Up: At [facility], you and your health needs are our priority.  As part of our continuing mission to provide you with exceptional heart care, we have created designated Provider Care Teams.  These Care Teams include your primary Cardiologist (physician) and Advanced Practice Providers (APPs -  Physician Assistants and Nurse Practitioners) who all work together to provide you with the care you need, when you need it. You will need a follow up appointment in AS NEEDED .  Please call our office 2 months in advance to schedule this appointment.  You may see Candee Furbish, MD or one of the following Advanced Practice Providers on your designated Care Team:   Truitt Merle, NP Cecilie Kicks, NP . Kathyrn Drown, NP  Any Other Special Instructions Will Be Listed Below (If Applicable).

## 2018-04-12 NOTE — Progress Notes (Signed)
Cardiology Office Note:    Date:  04/12/2018   ID:  Stacy Walls, DOB [birth year], MRN [id number]  PCP:  Tsosie Billing, MD  Cardiologist:  Candee Furbish, MD   Electrophysiologist:  None  Nephrologist: Dr. Justin Mend  Referring MD: Cristy Friedlander*   Chief Complaint  Patient presents with  . Surgical Clearance    History of Present Illness:    Stacy Walls is a 84 y.o. female with diabetes, hypertension, hyperlipidemia, chronic kidney disease.  She saw Dr. Verl Blalock in 2007 for preoperative clearance.  Nuclear stress test was suggestive of inferior ischemia with normal ejection fraction.  With diabetes, it is presumed that she has coronary artery disease.  She was asymptomatic and treated medically given her elevated creatinine.  She was last seen in clinic by Dr. Marlou Porch in 2018.  She has been diagnosed with breast cancer and is in need of right mastectomy.   Stacy Walls returns for surgical clearance.  She is here alone.  She is somewhat limited in her activity due to osteoarthritis.  She walks with a cane.  However, she is able to do most routine activities around her house without issues.  Of note, today is her 63rd wedding anniversary.  She denies any chest discomfort, significant shortness of breath, paroxysmal nocturnal dyspnea, lower extremity swelling or syncope.   Prior CV studies:   The following studies were reviewed today:  None   Past Medical History:  Diagnosis Date  . Acute cholecystitis   . Allergic rhinitis   . Anemia   . Chest pain   . Chronic renal insufficiency   . Clostridium difficile colitis   . Coronary atherosclerosis of native coronary vessel   . Cough   . Diabetes mellitus   . Dizziness   . DM2 (diabetes mellitus, type 2) ([place])   . Dyshidrosis   . GERD (gastroesophageal reflux disease)   . Gout   . HLD (hyperlipidemia)   . HTN (hypertension)   . Hypothyroidism   . Neck mass   . Neck pain   . Osteoporosis   . Routine  general medical examination at a health care facility   . Secondary hyperparathyroidism (of renal origin)   . Urinary incontinence   . Urinary tract infection    Surgical Hx: The patient  has a past surgical history that includes C-section (other) (1977); L ankle surgery (1988); electrocardiogram (02/01/06); bone density (08/21/05); Cholecystectomy; and total left knee (2007).   Current Medications: Current Meds  Medication Sig  . allopurinol (ZYLOPRIM) 300 MG tablet Take 1 tablet (300 mg total) by mouth daily.  . diclofenac sodium (VOLTAREN) 1 % GEL APPLY 2 GRAMS EXTERNALLY TO THE AFFECTED AREA FOUR TIMES DAILY  . epoetin alfa (PROCRIT) 50539 UNIT/ML injection Inject 10,000 Units into the skin every 30 (thirty) days. Dr. Justin Mend  . Ferrous Sulfate (IRON) 325 (65 FE) MG TABS Take 1 tablet by mouth daily.   . furosemide (LASIX) 20 MG tablet Take 1 tablet (20 mg total) by mouth daily.  . hydrOXYzine (VISTARIL) 25 MG capsule Take 25 mg by mouth at bedtime as needed (1 to 2 at bedtime as needed for sleep).  Marland Kitchen levothyroxine (SYNTHROID, LEVOTHROID) 88 MCG tablet Take 1 tablet (88 mcg total) by mouth daily.  Marland Kitchen losartan-hydrochlorothiazide (HYZAAR) 100-25 MG tablet Take 1 tablet by mouth daily.  . Multiple Vitamins-Minerals (MULTIVITAMIN WOMEN 50+ PO) Take by mouth.  Marland Kitchen omeprazole (PRILOSEC) 20 MG capsule TAKE 1 CAPSULE(20 MG) BY MOUTH DAILY  .  oxybutynin (DITROPAN) 5 MG tablet 1 tab daily  . sitaGLIPtin (JANUVIA) 100 MG tablet TAKE 1/2 TABLET(50 MG) BY MOUTH DAILY  . triamcinolone ointment (KENALOG) 0.1 % APPLY TOPICALLY TO THE AFFECTED AREA THREE TIMES DAILY AS NEEDED FOR ITCHING  . vitamin B-12 (CYANOCOBALAMIN) 500 MCG tablet Take 500 mcg by mouth daily.  . vitamin C (ASCORBIC ACID) 500 MG tablet Take 500 mg by mouth daily.     Allergies:   Ace inhibitors; Azithromycin; Pioglitazone; Sulfonamide derivatives; and Tramadol   Social History   Tobacco Use  . Smoking status: Never Smoker  .  Smokeless tobacco: Never Used  Substance Use Topics  . Alcohol use: No  . Drug use: No     Family Hx: The patient's family history includes Cancer in her brother; Coronary artery disease in her father; Diabetes in her brother, father, and sister; Stroke in her father and sister. There is no history of Breast cancer.  ROS:   Please see the history of present illness.    Review of Systems  HENT: Positive for hearing loss.   Cardiovascular: Positive for leg swelling.  Respiratory: Positive for cough.   Musculoskeletal: Positive for myalgias.  Gastrointestinal: Positive for constipation.  Neurological: Positive for loss of balance.   All other systems reviewed and are negative.   EKGs/Labs/Other Test Reviewed:    EKG:  EKG is  ordered today.  The ekg ordered today demonstrates NSR, HR 78, inf Q waves, QTc 426 ms  Recent Labs: 01/27/2018: TSH 7.97 04/08/2018: ALT 15; BUN 22; Creatinine 1.56; Hemoglobin 11.3; Platelet Count 153; Potassium 4.6; Sodium 130   Recent Lipid Panel Lab Results  Component Value Date/Time   CHOL 260 (H) 09/07/2017 11:30 AM   TRIG 274.0 (H) 09/07/2017 11:30 AM   HDL 39.70 09/07/2017 11:30 AM   CHOLHDL 7 09/07/2017 11:30 AM   LDLCALC 86 04/01/2015 12:00 PM   LDLDIRECT 159.0 09/07/2017 11:30 AM    Physical Exam:    VS:  BP 136/80   Pulse 78   Ht 5\' 2"  (1.575 m)   Wt 242 lb 1.9 oz (109.8 kg)   SpO2 95%   BMI 44.28 kg/m     Wt Readings from Last 3 Encounters:  04/12/18 242 lb 1.9 oz (109.8 kg)  04/08/18 247 lb (112 kg)  01/27/18 247 lb 12.8 oz (112.4 kg)     Physical Exam  Constitutional: She is oriented to person, place, and time. She appears well-developed and well-nourished. No distress.  HENT:  Head: Normocephalic and atraumatic.  Eyes: No scleral icterus.  Neck: No JVD present. No thyromegaly present.  Cardiovascular: Normal rate and regular rhythm.  No murmur heard. Pulmonary/Chest: Effort normal. She has no rales.  Abdominal: Soft.  She exhibits no distension.  Musculoskeletal:        General: No edema.  Lymphadenopathy:    She has no cervical adenopathy.  Neurological: She is alert and oriented to person, place, and time.  Skin: Skin is warm and dry.  Psychiatric: She has a normal mood and affect.    ASSESSMENT & PLAN:    Preoperative cardiovascular examination The Revised Cardiac Risk Index indicates that her Perioperative Risk of Major Cardiac Event is (%): 0.9.  Therefore, she is at low risk for perioperative complications.  Her Functional Capacity in METs is: 4.73 as indicated by the Duke Activity Status Index (DASI). According to ACC/AHA guidelines, no further cardiovascular testing needed.  The patient may proceed to surgery at acceptable risk.  Aspirin can be held for 7 days prior to her surgery.  Aspirin should be resumed post operatively when it is felt to be safe from a bleeding standpoint.    Essential hypertension  The patient's blood pressure is controlled on her current regimen.  Continue current therapy.    CKD (chronic kidney disease), stage III ([place])  Continue follow up with nephrology as planned.   Type 2 diabetes mellitus with stage 3 chronic kidney disease, without long-term current use of insulin ([place])  Managed by primary care.  Empagliflozin could be considered given the results of the EMPA-REG OUTCOME trial.   Dispo:  Return for follow up as needed with Dr. Marlou Porch.   Medication Adjustments/Labs and Tests Ordered: Current medicines are reviewed at length with the patient today.  Concerns regarding medicines are outlined above.  Tests Ordered: Orders Placed This Encounter  Procedures  . EKG 12-Lead   Medication Changes: No orders of the defined types were placed in this encounter.   Signed, [name], PA-C  04/12/2018 10:05 AM    [facility] [place], [place], [place]  [address] Phone: [phone]; Fax: [phone]

## 2018-04-12 NOTE — Telephone Encounter (Signed)
-----   Message from Jonnie Finner, RN sent at 04/12/2018 12:03 PM EST -----  ----- Message ----- From: Truitt Merle, MD Sent: 04/10/2018   3:18 PM EST To: Jonnie Finner, RN  Please let pt know the lab results. She has known CKD, mild anemia is stable, I will call her after her staging CT and bone scan, thanks  Truitt Merle  04/10/2018

## 2018-04-14 ENCOUNTER — Ambulatory Visit
Admission: RE | Admit: 2018-04-14 | Discharge: 2018-04-14 | Disposition: A | Discharge status: Home / Self Care | Payer: Medicare Other | Source: Ambulatory Visit | Attending: General Surgery | Admitting: General Surgery

## 2018-04-14 DIAGNOSIS — C50911 Malignant neoplasm of unspecified site of right female breast: Secondary | ICD-10-CM

## 2018-04-14 DIAGNOSIS — D0501 Lobular carcinoma in situ of right breast: Secondary | ICD-10-CM | POA: Diagnosis not present

## 2018-04-14 MED ORDER — GADOBUTROL 1 MMOL/ML IV SOLN
10.0000 mL | Freq: Once | INTRAVENOUS | Status: AC | PRN
  Administered 2018-04-14: 10 mL via INTRAVENOUS

## 2018-04-15 ENCOUNTER — Other Ambulatory Visit: Payer: Self-pay | Admitting: General Surgery

## 2018-04-15 ENCOUNTER — Telehealth: Payer: Self-pay

## 2018-04-15 DIAGNOSIS — C50411 Malignant neoplasm of upper-outer quadrant of right female breast: Secondary | ICD-10-CM

## 2018-04-15 NOTE — Telephone Encounter (Signed)
Per Dr. Burr Medico informed patient to not get the injection (epo) on Monday, she verbalized an understanding.

## 2018-04-18 ENCOUNTER — Encounter (HOSPITAL_COMMUNITY): Payer: Medicare Other

## 2018-04-21 ENCOUNTER — Other Ambulatory Visit: Payer: Self-pay | Admitting: General Surgery

## 2018-04-21 DIAGNOSIS — C50911 Malignant neoplasm of unspecified site of right female breast: Secondary | ICD-10-CM

## 2018-04-25 ENCOUNTER — Encounter

## 2018-04-25 ENCOUNTER — Other Ambulatory Visit: Payer: Self-pay | Admitting: General Surgery

## 2018-04-25 ENCOUNTER — Ambulatory Visit
Admission: RE | Admit: 2018-04-25 | Discharge: 2018-04-25 | Disposition: A | Discharge status: Home / Self Care | Payer: Medicare Other | Source: Ambulatory Visit | Attending: General Surgery | Admitting: General Surgery

## 2018-04-25 DIAGNOSIS — C50911 Malignant neoplasm of unspecified site of right female breast: Secondary | ICD-10-CM

## 2018-04-25 HISTORY — PX: BREAST BIOPSY: SHX20

## 2018-04-27 ENCOUNTER — Encounter (HOSPITAL_COMMUNITY): Payer: Self-pay

## 2018-04-27 ENCOUNTER — Ambulatory Visit (HOSPITAL_COMMUNITY)
Admission: RE | Admit: 2018-04-27 | Discharge: 2018-04-27 | Disposition: A | Discharge status: Home / Self Care | Payer: Medicare Other | Source: Ambulatory Visit | Attending: Hematology | Admitting: Hematology

## 2018-04-27 DIAGNOSIS — Z170 Estrogen receptor positive status [ER+]: Principal | ICD-10-CM

## 2018-04-27 DIAGNOSIS — C50111 Malignant neoplasm of central portion of right female breast: Secondary | ICD-10-CM | POA: Diagnosis present

## 2018-04-27 MED ORDER — TECHNETIUM TC 99M MEDRONATE IV KIT
20.0000 | PACK | Freq: Once | INTRAVENOUS | Status: AC | PRN
  Administered 2018-04-27: 19 via INTRAVENOUS

## 2018-04-29 ENCOUNTER — Inpatient Hospital Stay: Admission: RE | Admit: 2018-04-29 | Payer: Medicare Other | Source: Ambulatory Visit

## 2018-05-03 ENCOUNTER — Telehealth: Payer: Self-pay

## 2018-05-03 NOTE — Telephone Encounter (Signed)
Left voice message for patient regarding CT scan and bone scan results, per Dr. Burr Medico they show no evidence of distant metastasis.  She has sent a copy to Dr. Dalbert Batman.  Encouraged patient to call back if she has any questions.

## 2018-05-03 NOTE — Telephone Encounter (Signed)
-----   Message from Truitt Merle, MD sent at 05/01/2018  8:53 PM EST ----- Renelda Loma, please see the CT scan results, I do not think she needs PET, let know what you think. Thanks  Malachy Mood, please let pt know her CT and bone scan results, no evidence of distant metastasis. I copied the results to Dr. Dalbert Batman, thanks   Truitt Merle  05/01/2018

## 2018-05-04 ENCOUNTER — Other Ambulatory Visit: Payer: Self-pay | Admitting: General Surgery

## 2018-05-04 DIAGNOSIS — Z170 Estrogen receptor positive status [ER+]: Principal | ICD-10-CM

## 2018-05-04 DIAGNOSIS — C50811 Malignant neoplasm of overlapping sites of right female breast: Secondary | ICD-10-CM

## 2018-05-06 ENCOUNTER — Other Ambulatory Visit: Payer: Self-pay | Admitting: General Surgery

## 2018-05-06 DIAGNOSIS — C50811 Malignant neoplasm of overlapping sites of right female breast: Secondary | ICD-10-CM

## 2018-05-06 DIAGNOSIS — Z170 Estrogen receptor positive status [ER+]: Principal | ICD-10-CM

## 2018-05-09 ENCOUNTER — Telehealth: Payer: Self-pay | Admitting: Hematology

## 2018-05-09 NOTE — Telephone Encounter (Signed)
Scheduled appt per 1/17 sch message - pt is aware of appt date and time   

## 2018-05-11 ENCOUNTER — Telehealth: Payer: Self-pay

## 2018-05-11 ENCOUNTER — Other Ambulatory Visit: Payer: Self-pay | Admitting: Hematology

## 2018-05-11 NOTE — Telephone Encounter (Signed)
Patient calls stating she has appointment on 1/27 for Procrit and her surgery is scheduled 2/4.  She wants to make sure Dr. Burr Medico wants her to get the Procrit. [phone]

## 2018-05-12 ENCOUNTER — Telehealth: Payer: Self-pay

## 2018-05-12 NOTE — Telephone Encounter (Signed)
Spoke to patient regarding Procrit, per Dr. Burr Medico she is to hold off on getting Procrit as her surgery is 2/4, also informed her CT/bone scan showed no distant metastasis, she was already aware, appreciated the phone call.

## 2018-05-16 ENCOUNTER — Encounter (HOSPITAL_COMMUNITY): Payer: Medicare Other

## 2018-05-17 ENCOUNTER — Other Ambulatory Visit (HOSPITAL_COMMUNITY): Payer: Medicare Other

## 2018-05-20 ENCOUNTER — Inpatient Hospital Stay
Admission: RE | Admit: 2018-05-20 | Discharge: 2018-05-20 | Disposition: A | Discharge status: Home / Self Care | Payer: Medicare Other | Source: Ambulatory Visit | Attending: General Surgery | Admitting: General Surgery

## 2018-05-20 DIAGNOSIS — C50111 Malignant neoplasm of central portion of right female breast: Secondary | ICD-10-CM

## 2018-05-20 DIAGNOSIS — Z170 Estrogen receptor positive status [ER+]: Principal | ICD-10-CM

## 2018-05-24 ENCOUNTER — Ambulatory Visit: Admit: 2018-05-24 | Payer: Medicare Other | Admitting: General Surgery

## 2018-05-24 SURGERY — BREAST LUMPECTOMY WITH RADIOACTIVE SEED AND AXILLARY LYMPH NODE DISSECTION
Anesthesia: General | Laterality: Right

## 2018-05-30 ENCOUNTER — Encounter (HOSPITAL_COMMUNITY): Payer: Self-pay

## 2018-05-30 NOTE — Pre-Procedure Instructions (Signed)
Stacy Walls  05/30/2018     Your procedure is scheduled on Tuesday, February 18.  Report to [facility] Admitting at 12:15 PM                    Your surgery or procedure is scheduled for 2:15 PM   Call this number if you have problems the morning of surgery: [phone]  This is the number for the Pre- Surgical Desk.               For any other questions, please call [phone], Monday - Friday 8 AM - 4 PM.   Remember:  Do not eat after midnight Monday, February 17.  You may drink clear liquids until 11: 15 AM  Clear liquids allowed are:    Water, Juice (non-citric and without pulp), Carbonated beverages, Clear Tea, Black Coffee only, Plain Jell-O only, Gatorade and Plain Popsicles only    Take these medicines the morning of surgery with A SIP OF WATER: allopurinol (ZYLOPRIM) levothyroxine (SYNTHROID, LEVOTHROID)  omeprazole (PRILOSEC)  May use Flonase Nasal Spray  DO NOT take sitaGLIPtin (JANUVIA) the morning of surgery  Follow your surgeon's instructions regarding holding or continuing Aspirin.  1 Week prior to surgery STOP taking  Aspirin Products (Goody Powder, Excedrin Migraine), Ibuprofen (Advil), Naproxen (Aleve), Vitamins and Herbal Products (ie Fish Oil).  How to Manage Your Diabetes Before and After Surgery  Why is it important to control my blood sugar before and after surgery? . Improving blood sugar levels before and after surgery helps healing and can limit problems. . A way of improving blood sugar control is eating a healthy diet by: o  Eating less sugar and carbohydrates o  Increasing activity/exercise o  Talking with your doctor about reaching your blood sugar goals . High blood sugars (greater than 180 mg/dL) can raise your risk of infections and slow your recovery, so you will need to focus on controlling your diabetes during the weeks before surgery. . Make sure that the doctor who takes care of your diabetes knows about your  planned surgery including the date and location.  How do I manage my blood sugar before surgery? . Check your blood sugar at least 4 times a day, starting 2 days before surgery, to make sure that the level is not too high or low. o Check your blood sugar the morning of your surgery when you wake up and every 2 hours until you get to the Short Stay unit. . If your blood sugar is less than 70 mg/dL, you will need to treat for low blood sugar: o Do not take insulin. o Treat a low blood sugar (less than 70 mg/dL) with  cup of clear juice (cranberry or apple), 4 glucose tablets, OR glucose gel. Recheck blood sugar in 15 minutes after treatment (to make sure it is greater than 70 mg/dL). If your blood sugar is not greater than 70 mg/dL on recheck, call [phone] o  for further instructions. . Report your blood sugar to the short stay nurse when you get to Short Stay.  . If you are admitted to the hospital after surgery: o Your blood sugar will be checked by the staff and you will probably be given insulin after surgery (instead of oral diabetes medicines) to make sure you have good blood sugar levels. o The goal for blood sugar control after surgery is 80-180 mg/dL.  WHAT DO I DO ABOUT MY DIABETES MEDICATION?  Marland Kitchen Do not take oral diabetes medicines (pills) the morning of surgery.   [place]- Preparing For Surgery  Before surgery, you can play an important role. Because skin is not sterile, your skin needs to be as free of germs as possible. You can reduce the number of germs on your skin by washing with CHG (chlorahexidine gluconate) Soap before surgery.  CHG is an antiseptic cleaner which kills germs and bonds with the skin to continue killing germs even after washing.    Oral Hygiene is also important to reduce your risk of infection.  Remember - BRUSH YOUR TEETH THE MORNING OF SURGERY WITH YOUR REGULAR TOOTHPASTE  Please do not use if you have an allergy to CHG or  antibacterial soaps. If your skin becomes reddened/irritated stop using the CHG.  Do not shave (including legs and underarms) for at least 48 hours prior to first CHG shower. It is OK to shave your face.  Please follow these instructions carefully.   1. Shower the NIGHT BEFORE SURGERY and the MORNING OF SURGERY with CHG.   2. If you chose to wash your hair, wash your hair first as usual with your normal shampoo.  3. After you shampoo,wash your face and private area with the soap you use at home, then rinse your hair and body thoroughly to remove the shampoo and soap.   4. Use CHG as you would any other liquid soap. You can apply CHG directly to the skin and wash gently with a scrungie or a clean washcloth.   5. Apply the CHG Soap to your body ONLY FROM THE NECK DOWN.  Do not use on open wounds or open sores. Avoid contact with your eyes, ears, mouth and genitals (private parts).  6. Wash thoroughly, paying special attention to the area where your surgery will be performed.  7. Thoroughly rinse your body with warm water from the neck down.  8. DO NOT shower/wash with your normal soap after using and rinsing off the CHG Soap.  9. Pat yourself dry with a CLEAN TOWEL.  10. Wear CLEAN PAJAMAS to bed the night before surgery, wear comfortable clothes the morning of surgery  11. Place CLEAN SHEETS on your bed the night of your first shower and DO NOT SLEEP WITH PETS.    Day of Surgery: Shower as written above.  Do not wear lotions, powders, or perfumes, or deodorant. Please wear clean clothes to the hospital/surgery center.   Remember to brush your teeth WITH YOUR REGULAR TOOTHPASTE.  Do not wear jewelry, make-up or nail polish.  Do not shave 48 hours prior to surgery.  Men may shave face and neck.  Do not bring valuables to the hospital.  [facility] is not responsible for any belongings or valuables.  Contacts, dentures or bridgework may not be worn into surgery.  Leave your suitcase  in the car.  After surgery it may be brought to your room.  For patients admitted to the hospital, discharge time will be determined by your treatment team.  Patients discharged the day of surgery will not be allowed to drive home.   Please read over the following fact sheets that you were given.  Managing Pain, Surgical Site Infection, Coughing and Deep Breathing

## 2018-05-31 ENCOUNTER — Encounter (HOSPITAL_COMMUNITY): Payer: Self-pay

## 2018-05-31 ENCOUNTER — Telehealth: Payer: Self-pay | Admitting: Hematology

## 2018-05-31 ENCOUNTER — Other Ambulatory Visit: Payer: Self-pay

## 2018-05-31 ENCOUNTER — Encounter (HOSPITAL_COMMUNITY)
Admission: RE | Admit: 2018-05-31 | Discharge: 2018-05-31 | Disposition: A | Discharge status: Home / Self Care | Payer: Medicare Other | Source: Ambulatory Visit | Attending: General Surgery | Admitting: General Surgery

## 2018-05-31 DIAGNOSIS — Z01812 Encounter for preprocedural laboratory examination: Secondary | ICD-10-CM | POA: Insufficient documentation

## 2018-05-31 HISTORY — DX: Constipation, unspecified: K59.00

## 2018-05-31 HISTORY — DX: Major depressive disorder, single episode, unspecified: F32.9

## 2018-05-31 HISTORY — DX: Malignant (primary) neoplasm, unspecified: C80.1

## 2018-05-31 HISTORY — DX: Unspecified osteoarthritis, unspecified site: M19.90

## 2018-05-31 HISTORY — DX: Family history of other specified conditions: Z84.89

## 2018-05-31 HISTORY — DX: Depression, unspecified: F32.A

## 2018-05-31 LAB — COMPREHENSIVE METABOLIC PANEL
ALT: 18 U/L (ref 0–44)
AST: 24 U/L (ref 15–41)
Albumin: 3.3 g/dL — ABNORMAL LOW (ref 3.5–5.0)
Alkaline Phosphatase: 75 U/L (ref 38–126)
Anion gap: 10 (ref 5–15)
BUN: 31 mg/dL — AB (ref 8–23)
CO2: 26 mmol/L (ref 22–32)
Calcium: 10.6 mg/dL — ABNORMAL HIGH (ref 8.9–10.3)
Chloride: 100 mmol/L (ref 98–111)
Creatinine, Ser: 1.86 mg/dL — ABNORMAL HIGH (ref 0.44–1.00)
GFR calc Af Amer: 28 mL/min — ABNORMAL LOW (ref >60)
GFR, EST NON AFRICAN AMERICAN: 25 mL/min — AB (ref >60)
Glucose, Bld: 136 mg/dL — ABNORMAL HIGH (ref 70–99)
Potassium: 3.7 mmol/L (ref 3.5–5.1)
Sodium: 136 mmol/L (ref 135–145)
Total Bilirubin: 0.7 mg/dL (ref 0.3–1.2)
Total Protein: 6.3 g/dL — ABNORMAL LOW (ref 6.5–8.1)

## 2018-05-31 LAB — CBC WITH DIFFERENTIAL/PLATELET
ABS IMMATURE GRANULOCYTES: 0.03 10*3/uL (ref 0.00–0.07)
BASOS PCT: 1 %
Basophils Absolute: 0 10*3/uL (ref 0.0–0.1)
EOS ABS: 0.4 10*3/uL (ref 0.0–0.5)
Eosinophils Relative: 5 %
HCT: 34.7 % — ABNORMAL LOW (ref 36.0–46.0)
Hemoglobin: 10.7 g/dL — ABNORMAL LOW (ref 12.0–15.0)
Immature Granulocytes: 0 %
Lymphocytes Relative: 28 %
Lymphs Abs: 2 10*3/uL (ref 0.7–4.0)
MCH: 30 pg (ref 26.0–34.0)
MCHC: 30.8 g/dL (ref 30.0–36.0)
MCV: 97.2 fL (ref 80.0–100.0)
Monocytes Absolute: 0.8 10*3/uL (ref 0.1–1.0)
Monocytes Relative: 11 %
Neutro Abs: 4 10*3/uL (ref 1.7–7.7)
Neutrophils Relative %: 55 %
PLATELETS: 143 10*3/uL — AB (ref 150–400)
RBC: 3.57 MIL/uL — ABNORMAL LOW (ref 3.87–5.11)
RDW: 12.5 % (ref 11.5–15.5)
WBC: 7.2 10*3/uL (ref 4.0–10.5)
nRBC: 0 % (ref 0.0–0.2)

## 2018-05-31 LAB — GLUCOSE, CAPILLARY: Glucose-Capillary: 142 mg/dL — ABNORMAL HIGH (ref 70–99)

## 2018-05-31 LAB — HEMOGLOBIN A1C
HEMOGLOBIN A1C: 6.3 % — AB (ref 4.8–5.6)
Mean Plasma Glucose: 134.11 mg/dL

## 2018-05-31 NOTE — Progress Notes (Signed)
PCP -  Dr. Mila Palmer  Cardiologist -Dr Marlou Porch.  Cardiac clearance - low risk- 04/12/18  Chest x-ray - na EKG - 04/12/2018  Stress Test - no  ECHO - no  Cardiac Cath - no   Sleep Study - no CPAP - no  LABS- ACBC, Cmp  ASA- I instructed patient to check with Dr. Jadene Pierini- 6.3 05/31/2018 Fasting Blood Sugar - 120's Checks Blood Sugar __1___ times a day  Anesthesia- na  Pt denies having chest pain, sob, or fever at this time. All instructions explained to the pt, with a verbal understanding of the material. Pt agrees to go over the instructions while at home for a better understanding. The opportunity to ask questions was provided.  Mrs Anfinson's husband of 81 years died the end of 10-Jun-2018, patient has been started on anti depression and Xanax.  Patient doesn't like the idea of taking anti depressions, but takes them most days.  Mrs Vanvalkenburgh has a supportive family, "my daughter has not left my side since he died."

## 2018-05-31 NOTE — Pre-Procedure Instructions (Addendum)
Stacy Walls  05/31/2018     Your procedure is scheduled on Tuesday, February 18.  Report to [facility] Admitting at 12:15 PM                    Your surgery or procedure is scheduled for 2:15 PM   Call this number if you have problems the morning of surgery: [phone]  This is the number for the Pre- Surgical Desk.               For any other questions, please call [phone], Monday - Friday 8 AM - 4 PM.   Remember:  Do not eat after midnight Monday, February 17.  You may drink clear liquids until 11: 15 AM  Clear liquids allowed are:    Water, Juice (non-citric and without pulp), Carbonated beverages, Clear Tea, Black Coffee only, Plain Jell-O only, Gatorade and Plain Popsicles only    Take these medicines the morning of surgery with A SIP OF WATER: allopurinol (ZYLOPRIM) levothyroxine (SYNTHROID, LEVOTHROID)  omeprazole (PRILOSEC)  Escitalopram  May use Flonase Nasal Spray if needed. May take Alprazolam if needed  DO NOT take sitaGLIPtin (JANUVIA) the morning of surgery  Follow your surgeon's instructions regarding holding or continuing Aspirin.  1 Week prior to surgery STOP taking  Aspirin Products (Goody Powder, Excedrin Migraine), Ibuprofen (Advil), Naproxen (Aleve), Vitamins and Herbal Products (ie Fish Oil).  How to Manage Your Diabetes Before and After Surgery  Why is it important to control my blood sugar before and after surgery? . Improving blood sugar levels before and after surgery helps healing and can limit problems. . A way of improving blood sugar control is eating a healthy diet by: o  Eating less sugar and carbohydrates o  Increasing activity/exercise o  Talking with your doctor about reaching your blood sugar goals . High blood sugars (greater than 180 mg/dL) can raise your risk of infections and slow your recovery, so you will need to focus on controlling your diabetes during the weeks before surgery. . Make sure that the  doctor who takes care of your diabetes knows about your planned surgery including the date and location.  How do I manage my blood sugar before surgery? . Check your blood sugar at least 4 times a day, starting 2 days before surgery, to make sure that the level is not too high or low. o Check your blood sugar the morning of your surgery when you wake up and every 2 hours until you get to the Short Stay unit. . If your blood sugar is less than 70 mg/dL, you will need to treat for low blood sugar: o Do not take insulin. o Treat a low blood sugar (less than 70 mg/dL) with  cup of clear juice (cranberry or apple), 4 glucose tablets, OR glucose gel. Recheck blood sugar in 15 minutes after treatment (to make sure it is greater than 70 mg/dL). If your blood sugar is not greater than 70 mg/dL on recheck, call [phone] o  for further instructions. . Report your blood sugar to the short stay nurse when you get to Short Stay.  . If you are admitted to the hospital after surgery: o Your blood sugar will be checked by the staff and you will probably be given insulin after surgery (instead of oral diabetes medicines) to make sure you have good blood sugar levels. o The goal for blood sugar control after surgery is 80-180 mg/dL.  WHAT DO I DO ABOUT MY DIABETES MEDICATION?  Marland Kitchen Do not take oral diabetes medicines (pills) the morning of surgery.   [place]- Preparing For Surgery  Before surgery, you can play an important role. Because skin is not sterile, your skin needs to be as free of germs as possible. You can reduce the number of germs on your skin by washing with CHG (chlorahexidine gluconate) Soap before surgery.  CHG is an antiseptic cleaner which kills germs and bonds with the skin to continue killing germs even after washing.    Oral Hygiene is also important to reduce your risk of infection.  Remember - BRUSH YOUR TEETH THE MORNING OF SURGERY WITH YOUR REGULAR  TOOTHPASTE  Please do not use if you have an allergy to CHG or antibacterial soaps. If your skin becomes reddened/irritated stop using the CHG.  Do not shave (including legs and underarms) for at least 48 hours prior to first CHG shower. It is OK to shave your face.  Please follow these instructions carefully.   1. Shower the NIGHT BEFORE SURGERY and the MORNING OF SURGERY with CHG.   2. If you chose to wash your hair, wash your hair first as usual with your normal shampoo.  3. After you shampoo,wash your face and private area with the soap you use at home, then rinse your hair and body thoroughly to remove the shampoo and soap.   4. Use CHG as you would any other liquid soap. You can apply CHG directly to the skin and wash gently with a scrungie or a clean washcloth.   5. Apply the CHG Soap to your body ONLY FROM THE NECK DOWN.  Do not use on open wounds or open sores. Avoid contact with your eyes, ears, mouth and genitals (private parts).  6. Wash thoroughly, paying special attention to the area where your surgery will be performed.  7. Thoroughly rinse your body with warm water from the neck down.  8. DO NOT shower/wash with your normal soap after using and rinsing off the CHG Soap.  9. Pat yourself dry with a CLEAN TOWEL.  10. Wear CLEAN PAJAMAS to bed the night before surgery, wear comfortable clothes the morning of surgery  11. Place CLEAN SHEETS on your bed the night of your first shower and DO NOT SLEEP WITH PETS.    Day of Surgery: Shower as written above.  Do not wear lotions, powders, or perfumes, or deodorant. Please wear clean clothes to the hospital/surgery center.   Remember to brush your teeth WITH YOUR REGULAR TOOTHPASTE.  Do not wear jewelry, make-up or nail polish.  Do not shave 48 hours prior to surgery.  Men may shave face and neck.  Do not bring valuables to the hospital.  [facility] is not responsible for any belongings or valuables.  Contacts, dentures  or bridgework may not be worn into surgery.  Leave your suitcase in the car.  After surgery it may be brought to your room.  For patients admitted to the hospital, discharge time will be determined by your treatment team.  Patients discharged the day of surgery will not be allowed to drive home.   Please read over the following fact sheets that you were given.  Managing Pain, Surgical Site Infection, Coughing and Deep Breathing

## 2018-05-31 NOTE — Pre-Procedure Instructions (Addendum)
Stacy Walls  05/31/2018     Your procedure is scheduled on Tuesday, February 18.  Report to [facility] Admitting at 12:15 PM                    Your surgery or procedure is scheduled for 2:15 PM   Call this number if you have problems the morning of surgery: [phone]  This is the number for the Pre- Surgical Desk.               For any other questions, please call [phone], Monday - Friday 8 AM - 4 PM.   Remember:  Do not eat after midnight Monday, February 17.  You may drink clear liquids until 11: 15 AM  Clear liquids allowed are:    Water, Juice (non-citric and without pulp), Carbonated beverages, Clear Tea, Black Coffee only, Plain Jell-O only, Gatorade and Plain Popsicles only    Take these medicines the morning of surgery with A SIP OF WATER: allopurinol (ZYLOPRIM) levothyroxine (SYNTHROID, LEVOTHROID)  omeprazole (PRILOSEC)  Escitalopram  May use Flonase Nasal Spray if needed. May take Alprazolam if needed  DO NOT take sitaGLIPtin (JANUVIA) the morning of surgery  Follow your surgeon's instructions regarding holding or continuing Aspirin.  1 Week prior to surgery STOP taking  Aspirin Products (Goody Powder, Excedrin Migraine), Ibuprofen (Advil), Naproxen (Aleve), Vitamins and Herbal Products (ie Fish Oil).  How to Manage Your Diabetes Before and After Surgery  Why is it important to control my blood sugar before and after surgery? . Improving blood sugar levels before and after surgery helps healing and can limit problems. . A way of improving blood sugar control is eating a healthy diet by: o  Eating less sugar and carbohydrates o  Increasing activity/exercise o  Talking with your doctor about reaching your blood sugar goals . High blood sugars (greater than 180 mg/dL) can raise your risk of infections and slow your recovery, so you will need to focus on controlling your diabetes during the weeks before surgery. . Make sure that the  doctor who takes care of your diabetes knows about your planned surgery including the date and location.  How do I manage my blood sugar before surgery? . Check your blood sugar at least 4 times a day, starting 2 days before surgery, to make sure that the level is not too high or low. o Check your blood sugar the morning of your surgery when you wake up and every 2 hours until you get to the Short Stay unit. . If your blood sugar is less than 70 mg/dL, you will need to treat for low blood sugar: o Do not take insulin. o Treat a low blood sugar (less than 70 mg/dL) with  cup of clear juice (cranberry or apple), 4 glucose tablets, OR glucose gel. Recheck blood sugar in 15 minutes after treatment (to make sure it is greater than 70 mg/dL). If your blood sugar is not greater than 70 mg/dL on recheck, call [phone] o  for further instructions. . Report your blood sugar to the short stay nurse when you get to Short Stay.  . If you are admitted to the hospital after surgery: o Your blood sugar will be checked by the staff and you will probably be given insulin after surgery (instead of oral diabetes medicines) to make sure you have good blood sugar levels. o The goal for blood sugar control after surgery is 80-180 mg/dL.  WHAT DO I DO ABOUT MY DIABETES MEDICATION?  Marland Kitchen Do not take oral diabetes medicines (pills) the morning of surgery.   [place]- Preparing For Surgery  Before surgery, you can play an important role. Because skin is not sterile, your skin needs to be as free of germs as possible. You can reduce the number of germs on your skin by washing with CHG (chlorahexidine gluconate) Soap before surgery.  CHG is an antiseptic cleaner which kills germs and bonds with the skin to continue killing germs even after washing.    Oral Hygiene is also important to reduce your risk of infection.  Remember - BRUSH YOUR TEETH THE MORNING OF SURGERY WITH YOUR REGULAR  TOOTHPASTE  Please do not use if you have an allergy to CHG or antibacterial soaps. If your skin becomes reddened/irritated stop using the CHG.  Do not shave (including legs and underarms) for at least 48 hours prior to first CHG shower. It is OK to shave your face.  Please follow these instructions carefully.   1. Shower the NIGHT BEFORE SURGERY and the MORNING OF SURGERY with CHG.   2. If you chose to wash your hair, wash your hair first as usual with your normal shampoo.  3. After you shampoo,wash your face and private area with the soap you use at home, then rinse your hair and body thoroughly to remove the shampoo and soap.   4. Use CHG as you would any other liquid soap. You can apply CHG directly to the skin and wash gently with a scrungie or a clean washcloth.   5. Apply the CHG Soap to your body ONLY FROM THE NECK DOWN.  Do not use on open wounds or open sores. Avoid contact with your eyes, ears, mouth and genitals (private parts).  6. Wash thoroughly, paying special attention to the area where your surgery will be performed.  7. Thoroughly rinse your body with warm water from the neck down.  8. DO NOT shower/wash with your normal soap after using and rinsing off the CHG Soap.  9. Pat yourself dry with a CLEAN TOWEL.  10. Wear CLEAN PAJAMAS to bed the night before surgery, wear comfortable clothes the morning of surgery  11. Place CLEAN SHEETS on your bed the night of your first shower and DO NOT SLEEP WITH PETS.    Day of Surgery: Shower as written above.  Do not wear lotions, powders, or perfumes, or deodorant. Please wear clean clothes to the hospital/surgery center.   Remember to brush your teeth WITH YOUR REGULAR TOOTHPASTE.  Do not wear jewelry, make-up or nail polish.  Do not shave 48 hours prior to surgery.  Men may shave face and neck.  Do not bring valuables to the hospital.  [facility] is not responsible for any belongings or valuables.  Contacts, dentures  or bridgework may not be worn into surgery.  Leave your suitcase in the car.  After surgery it may be brought to your room.  For patients admitted to the hospital, discharge time will be determined by your treatment team.  Patients discharged the day of surgery will not be allowed to drive home.   Please read over the following fact sheets that you were given.  Managing Pain, Surgical Site Infection, Coughing and Deep Breathing

## 2018-05-31 NOTE — Telephone Encounter (Signed)
R/s appt per 2/7 sch message - unable to reach patient - left message with appt date and time

## 2018-06-05 NOTE — H&P (Signed)
Stacy Walls Location: [facility] Patient #: [id number] DOB: Mar 09, 1933 Married / Language: English / Race: Black or African American Female       History of Present Illness      This is an 85 year old woman who returns with 2 of her children for discussion and planning of definitive surgery for her right breast cancer. Her PCP is Bradd Burner. Her oncologist is Dr. Burr Medico Her cardiologist is Dr. Marlou Porch.       She has no prior history of breast disease. No mammograms for 10 years. Recent imaging studies showed a 1.1 cm mass in the right breast at the 12 o'clock position 7 cm from the nipple and a second mass in the right breast, also 12:00 5 cm from the nipple 1 cm in size. These were said to be 1.3 cm apart. There was also some coarse clustered calcifications slightly lateral to the known cancer 8 mm diameter. This was 1.2 cm lateral to the small cancers At least 3 abnormal lymph nodes by ultrasound the 2 masses at the 12 o'clock position were biopsied and showed receptor positive invasive lobular carcinoma. HER-2 negative. Ki-67 10% The 8 mm area of calcification slightly lateral was not biopsied 1 axillary lymph node was biopsied is consistent with invasive lobular carcinoma She went and had an MRI and there was actually a fourth area nearby was biopsied also showed invasive lobular carcinoma. The MRI also showed at least 4 abnormal lymph nodes I have reviewed all of these imaging studies with Dr. Evangeline Dakin. It appears that although she has multifocal cancer it is limited to the upper quadrant of her right breast. This is a spherical area at least 5 cm in diameter. We have decided that we would like to bracket this with 2 radioactive seeds from superior to inferior      She has seen Dr. Burr Medico. She has been discussed at breast conference. CT scans and bone scan are negative for metastatic disease. She has huge amount of comorbidities and we would like  to avoid a mastectomy on her large breast and I think we can do that Consensus recommendation is generous right breast lumpectomy with bracketed radioactive seed localization and complete right axillary lymph node dissection. She will need radiation therapy She is not a candidate for chemotherapy Possibly she will receive antiestrogen therapy        Comorbidities include coronary artery disease without MI. Cholecystectomy. Non-insulin-dependent diabetes with CKD. Secondary hyperparathyroidism. C-section through a low midline incision. Left total knee replacement. Hypothyroidism. Developed C. difficile colitis following her total knee replacement that resolved. Family history is negative for breast: Ovarian or pancreatic cancer. Mother make brother may have died of liver cancer. Extensors was reveals she is married. Husband here today. 2 children are with her today. Denies alcohol or tobacco. Also with a cane. Lives in [place].      She has seen Dr. Marlou Porch and has received cardiac clearance      She'll be scheduled for right breast lumpectomy with bracketed radioactive seed localization Orient seeds superior to inferior Complete right axillary lymph node dissection She will have a drain and will need to be admitted overnight I discussed the indications, details, techniques, and numerous risk of the surgery with her and her children. She is aware of the risk of bleeding, infection, shoulder disability, arm swelling, arm numbness, reoperation for positive margins, cardiac pulmonary and thromboembolic problems. She understands these issues well. All of her questions are answered. She agrees  with this plan.    Allergies Sulfa Antibiotics  Dermatitis. Allergies Reconciled   Medication History  Omeprazole (20MG Capsule DR, Oral) Active. Allopurinol (300MG Tablet, Oral) Active. Furosemide (20MG Tablet, Oral) Active. Levothyroxine Sodium (88MCG Tablet, Oral)  Active. Oxybutynin Chloride (5MG Tablet, Oral) Active. Januvia (100MG Tablet, Oral) Active. hydrOXYzine Pamoate (25MG Capsule, Oral) Active. Aspirin (81MG Tablet Chewable, Oral) Active. Medications Reconciled  Vitals  Weight: 243 lb Height: 62in Body Surface Area: 2.08 m Body Mass Index: 44.44 kg/m  Temp.: 96.20F(Temporal)  Pulse: 106 (Regular)  BP: 138/78 (Sitting, Right Arm, Standard)     Physical Exam General Mental Status-Alert. General Appearance-Consistent with stated age. Hydration-Well hydrated. Voice-Normal. Note: Alert. No distress. Cooperative. BMI 44   Head and Neck Head-normocephalic, atraumatic with no lesions or palpable masses. Trachea-midline. Thyroid Gland Characteristics - normal size and consistency.  Eye Eyeball - Bilateral-Extraocular movements intact. Sclera/Conjunctiva - Bilateral-No scleral icterus.  Chest and Lung Exam Chest and lung exam reveals -quiet, even and easy respiratory effort with no use of accessory muscles and on auscultation, normal breath sounds, no adventitious sounds and normal vocal resonance. Inspection Chest Wall - Normal. Back - normal.  Breast Note: Breasts are very large and ptotic. Slight thickening at the 12 o'clock position of the right breast. No discrete mass maybe a little hematoma. No other skin changes or masses in either breast. I can feel one small palpable lymph node in the right axilla. Left axilla is clinically negative. No adenopathy in the neck.   Cardiovascular Cardiovascular examination reveals -normal heart sounds, regular rate and rhythm with no murmurs and normal pedal pulses bilaterally.  Abdomen Inspection Inspection of the abdomen reveals - No Hernias. Skin - Scar - Note: Lower midline incision from C-section and cholecystectomy scars. Palpation/Percussion Palpation and Percussion of the abdomen reveal - Soft, Non Tender, No Rebound tenderness, No  Rigidity (guarding) and No hepatosplenomegaly. Auscultation Auscultation of the abdomen reveals - Bowel sounds normal.  Neurologic Neurologic evaluation reveals -alert and oriented x 3 with no impairment of recent or remote memory. Mental Status-Normal.  Musculoskeletal Normal Exam - Left-Upper Extremity Strength Normal and Lower Extremity Strength Normal. Normal Exam - Right-Upper Extremity Strength Normal and Lower Extremity Strength Normal.  Lymphatic Head & Neck  General Head & Neck Lymphatics: Bilateral - Description - Normal. Axillary  General Axillary Region: Bilateral - Description - Normal. Tenderness - Non Tender. Femoral & Inguinal  Generalized Femoral & Inguinal Lymphatics: Bilateral - Description - Normal. Tenderness - Non Tender.    Assessment & Plan   PRIMARY CANCER OF UPPER OUTER QUADRANT OF RIGHT FEMALE BREAST (C50.411)  You have now completed 3 biopsies of your right breast and one biopsy of the lymph nodes on your arm All 3 biopsies of the breast showed cancer There is also some suspicious calcifications nearby The biopsy of the lymph node also shows cancer Fortunately, all of the cancer appears to be in one quadrant, so we can offer lumpectomy  You have seen Dr. Burr Medico, the medical oncologist at the cancer center, and you'll see her after surgery  You have seen the cardiologist and they have cleared you for surgery  We will have to place 2 radioactive seeds in the right breast a couple of days before the surgery to help guide the lumpectomy In your right axilla, we will have to remove all of the level I and level II lymph nodes. This is called a complete axillary lymph node dissection We will need to leave a drain  under your arm and you will need to stay in the hospital one night. I have discussed the indications, techniques, and risk of the surgery in detail with you and your family members  PRIMARY MALIGNANT NEOPLASM OF RIGHT BREAST WITH  METASTASIS TO MOVABLE IPSILATERAL LEVEL 1 OR 2 AXILLARY LYMPH NODES (N1) (C50.911)  CORONARY ARTERY DISEASE, OCCLUSIVE (I25.10) TYPE 2 DIABETES MELLITUS TREATED WITHOUT INSULIN (E11.9) BMI 45.0-49.9, ADULT (Z68.42) SECONDARY HYPERPARATHYROIDISM, RENAL (N25.81) HISTORY OF C-SECTION (J69.678) HISTORY OF CHOLECYSTECTOMY (Z90.49) C. DIFFICILE COLITIS (A04.72) H/O TOTAL KNEE REPLACEMENT, LEFT (L38.101) HYPOTHYROIDISM, ADULT (E03.9)    Taci Sterling M. Dalbert Batman, M.D., [facility] General and Minimally invasive Surgery Breast and Colorectal Surgery Office:   [phone] Pager:   [phone]

## 2018-06-06 ENCOUNTER — Ambulatory Visit
Admission: RE | Admit: 2018-06-06 | Discharge: 2018-06-06 | Disposition: A | Discharge status: Home / Self Care | Payer: Medicare Other | Source: Ambulatory Visit | Attending: General Surgery | Admitting: General Surgery

## 2018-06-06 ENCOUNTER — Ambulatory Visit: Payer: Medicare Other | Admitting: Hematology

## 2018-06-06 DIAGNOSIS — C50811 Malignant neoplasm of overlapping sites of right female breast: Secondary | ICD-10-CM

## 2018-06-06 DIAGNOSIS — Z170 Estrogen receptor positive status [ER+]: Principal | ICD-10-CM

## 2018-06-07 ENCOUNTER — Ambulatory Visit
Admission: RE | Admit: 2018-06-07 | Discharge: 2018-06-07 | Disposition: A | Discharge status: Home / Self Care | Payer: Medicare Other | Source: Ambulatory Visit | Attending: General Surgery | Admitting: General Surgery

## 2018-06-07 ENCOUNTER — Ambulatory Visit (HOSPITAL_COMMUNITY): Payer: Medicare Other | Admitting: Certified Registered Nurse Anesthetist

## 2018-06-07 ENCOUNTER — Ambulatory Visit (HOSPITAL_COMMUNITY)
Admission: RE | Admit: 2018-06-07 | Discharge: 2018-06-08 | Disposition: A | Discharge status: Home / Self Care | Payer: Medicare Other | Attending: General Surgery | Admitting: General Surgery

## 2018-06-07 ENCOUNTER — Encounter (HOSPITAL_COMMUNITY)
Admission: RE | Disposition: A | Discharge status: Home / Self Care | Payer: Self-pay | Source: Home / Self Care | Attending: General Surgery

## 2018-06-07 ENCOUNTER — Encounter (HOSPITAL_COMMUNITY): Payer: Self-pay | Admitting: Certified Registered Nurse Anesthetist

## 2018-06-07 ENCOUNTER — Other Ambulatory Visit: Payer: Self-pay

## 2018-06-07 DIAGNOSIS — K219 Gastro-esophageal reflux disease without esophagitis: Secondary | ICD-10-CM | POA: Diagnosis not present

## 2018-06-07 DIAGNOSIS — Z882 Allergy status to sulfonamides status: Secondary | ICD-10-CM | POA: Insufficient documentation

## 2018-06-07 DIAGNOSIS — N2581 Secondary hyperparathyroidism of renal origin: Secondary | ICD-10-CM | POA: Diagnosis not present

## 2018-06-07 DIAGNOSIS — Z885 Allergy status to narcotic agent status: Secondary | ICD-10-CM | POA: Diagnosis not present

## 2018-06-07 DIAGNOSIS — Z170 Estrogen receptor positive status [ER+]: Secondary | ICD-10-CM | POA: Insufficient documentation

## 2018-06-07 DIAGNOSIS — C773 Secondary and unspecified malignant neoplasm of axilla and upper limb lymph nodes: Secondary | ICD-10-CM | POA: Insufficient documentation

## 2018-06-07 DIAGNOSIS — Z888 Allergy status to other drugs, medicaments and biological substances status: Secondary | ICD-10-CM | POA: Diagnosis not present

## 2018-06-07 DIAGNOSIS — Z7984 Long term (current) use of oral hypoglycemic drugs: Secondary | ICD-10-CM | POA: Diagnosis not present

## 2018-06-07 DIAGNOSIS — I129 Hypertensive chronic kidney disease with stage 1 through stage 4 chronic kidney disease, or unspecified chronic kidney disease: Secondary | ICD-10-CM | POA: Insufficient documentation

## 2018-06-07 DIAGNOSIS — C50811 Malignant neoplasm of overlapping sites of right female breast: Secondary | ICD-10-CM

## 2018-06-07 DIAGNOSIS — Z7982 Long term (current) use of aspirin: Secondary | ICD-10-CM | POA: Diagnosis not present

## 2018-06-07 DIAGNOSIS — E039 Hypothyroidism, unspecified: Secondary | ICD-10-CM | POA: Diagnosis not present

## 2018-06-07 DIAGNOSIS — N189 Chronic kidney disease, unspecified: Secondary | ICD-10-CM | POA: Insufficient documentation

## 2018-06-07 DIAGNOSIS — Z96652 Presence of left artificial knee joint: Secondary | ICD-10-CM | POA: Diagnosis not present

## 2018-06-07 DIAGNOSIS — E1122 Type 2 diabetes mellitus with diabetic chronic kidney disease: Secondary | ICD-10-CM | POA: Diagnosis not present

## 2018-06-07 DIAGNOSIS — I2510 Atherosclerotic heart disease of native coronary artery without angina pectoris: Secondary | ICD-10-CM | POA: Diagnosis not present

## 2018-06-07 DIAGNOSIS — C50411 Malignant neoplasm of upper-outer quadrant of right female breast: Secondary | ICD-10-CM | POA: Insufficient documentation

## 2018-06-07 DIAGNOSIS — Z8619 Personal history of other infectious and parasitic diseases: Secondary | ICD-10-CM | POA: Insufficient documentation

## 2018-06-07 DIAGNOSIS — Z881 Allergy status to other antibiotic agents status: Secondary | ICD-10-CM | POA: Diagnosis not present

## 2018-06-07 DIAGNOSIS — Z79899 Other long term (current) drug therapy: Secondary | ICD-10-CM | POA: Insufficient documentation

## 2018-06-07 DIAGNOSIS — Z79890 Hormone replacement therapy (postmenopausal): Secondary | ICD-10-CM | POA: Insufficient documentation

## 2018-06-07 DIAGNOSIS — K5900 Constipation, unspecified: Secondary | ICD-10-CM | POA: Insufficient documentation

## 2018-06-07 DIAGNOSIS — C50111 Malignant neoplasm of central portion of right female breast: Secondary | ICD-10-CM | POA: Diagnosis present

## 2018-06-07 HISTORY — PX: BREAST LUMPECTOMY WITH RADIOACTIVE SEED AND AXILLARY LYMPH NODE DISSECTION: SHX6656

## 2018-06-07 HISTORY — PX: BREAST LUMPECTOMY: SHX2

## 2018-06-07 LAB — CBC
HCT: 33.4 % — ABNORMAL LOW (ref 36.0–46.0)
Hemoglobin: 10.7 g/dL — ABNORMAL LOW (ref 12.0–15.0)
MCH: 30.8 pg (ref 26.0–34.0)
MCHC: 32 g/dL (ref 30.0–36.0)
MCV: 96.3 fL (ref 80.0–100.0)
PLATELETS: 136 10*3/uL — AB (ref 150–400)
RBC: 3.47 MIL/uL — ABNORMAL LOW (ref 3.87–5.11)
RDW: 12.6 % (ref 11.5–15.5)
WBC: 10.9 10*3/uL — AB (ref 4.0–10.5)
nRBC: 0 % (ref 0.0–0.2)

## 2018-06-07 LAB — GLUCOSE, CAPILLARY
Glucose-Capillary: 135 mg/dL — ABNORMAL HIGH (ref 70–99)
Glucose-Capillary: 119 mg/dL — ABNORMAL HIGH (ref 70–99)
Glucose-Capillary: 152 mg/dL — ABNORMAL HIGH (ref 70–99)
Glucose-Capillary: 205 mg/dL — ABNORMAL HIGH (ref 70–99)

## 2018-06-07 LAB — CREATININE, SERUM
Creatinine, Ser: 1.76 mg/dL — ABNORMAL HIGH (ref 0.44–1.00)
GFR calc Af Amer: 30 mL/min — ABNORMAL LOW (ref >60)
GFR calc non Af Amer: 26 mL/min — ABNORMAL LOW (ref >60)

## 2018-06-07 SURGERY — BREAST LUMPECTOMY WITH RADIOACTIVE SEED AND AXILLARY LYMPH NODE DISSECTION
Anesthesia: General | Site: Breast | Laterality: Right

## 2018-06-07 MED ORDER — LIDOCAINE 2% (20 MG/ML) 5 ML SYRINGE
INTRAMUSCULAR | Status: DC | PRN
  Administered 2018-06-07: 100 mg via INTRAVENOUS

## 2018-06-07 MED ORDER — POLYETHYL GLYCOL-PROPYL GLYCOL 0.4-0.3 % OP SOLN: 1.0000 [drp] | Freq: Every day | OPHTHALMIC | Status: DC | PRN

## 2018-06-07 MED ORDER — HYDROCODONE-ACETAMINOPHEN 5-325 MG PO TABS
1.0000 | ORAL_TABLET | ORAL | Status: DC | PRN
  Administered 2018-06-07 – 2018-06-08 (×2): 1 via ORAL
  Filled 2018-06-07 (×2): qty 1

## 2018-06-07 MED ORDER — GABAPENTIN 300 MG PO CAPS
300.0000 mg | ORAL_CAPSULE | ORAL | Status: AC
  Administered 2018-06-07: 300 mg via ORAL
  Filled 2018-06-07: qty 1

## 2018-06-07 MED ORDER — HYDROXYZINE HCL 50 MG PO TABS
25.0000 mg | ORAL_TABLET | Freq: Every evening | ORAL | Status: DC | PRN
  Filled 2018-06-07: qty 1

## 2018-06-07 MED ORDER — HYDROMORPHONE HCL 1 MG/ML IJ SOLN: 1.0000 mg | INTRAMUSCULAR | Status: DC | PRN

## 2018-06-07 MED ORDER — LEVOTHYROXINE SODIUM 88 MCG PO TABS
88.0000 ug | ORAL_TABLET | Freq: Every day | ORAL | Status: DC
  Administered 2018-06-08: 88 ug via ORAL
  Filled 2018-06-07: qty 1

## 2018-06-07 MED ORDER — 0.9 % SODIUM CHLORIDE (POUR BTL) OPTIME
TOPICAL | Status: DC | PRN
  Administered 2018-06-07: 1000 mL

## 2018-06-07 MED ORDER — ONDANSETRON HCL 4 MG/2ML IJ SOLN
INTRAMUSCULAR | Status: DC | PRN
  Administered 2018-06-07: 4 mg via INTRAVENOUS

## 2018-06-07 MED ORDER — METHYLENE BLUE 0.5 % INJ SOLN
INTRAVENOUS | Status: AC
  Filled 2018-06-07: qty 10

## 2018-06-07 MED ORDER — FENTANYL CITRATE (PF) 100 MCG/2ML IJ SOLN
50.0000 ug | Freq: Once | INTRAMUSCULAR | Status: AC
  Administered 2018-06-07: 50 ug via INTRAVENOUS

## 2018-06-07 MED ORDER — PROPOFOL 10 MG/ML IV BOLUS
INTRAVENOUS | Status: DC | PRN
  Administered 2018-06-07: 200 mg via INTRAVENOUS

## 2018-06-07 MED ORDER — ALPRAZOLAM 0.25 MG PO TABS: 0.2500 mg | ORAL_TABLET | Freq: Three times a day (TID) | ORAL | Status: DC | PRN

## 2018-06-07 MED ORDER — HYDROMORPHONE HCL 1 MG/ML IJ SOLN
0.2500 mg | INTRAMUSCULAR | Status: DC | PRN
  Administered 2018-06-07: 0.25 mg via INTRAVENOUS

## 2018-06-07 MED ORDER — SODIUM CHLORIDE 0.9 % IV SOLN
INTRAVENOUS | Status: DC
  Administered 2018-06-07 (×2): via INTRAVENOUS

## 2018-06-07 MED ORDER — HYDROCHLOROTHIAZIDE 25 MG PO TABS
25.0000 mg | ORAL_TABLET | Freq: Every day | ORAL | Status: DC
  Administered 2018-06-07 – 2018-06-08 (×2): 25 mg via ORAL
  Filled 2018-06-07 (×2): qty 1

## 2018-06-07 MED ORDER — LACTATED RINGERS IV SOLN
INTRAVENOUS | Status: DC
  Administered 2018-06-07 – 2018-06-08 (×2): via INTRAVENOUS

## 2018-06-07 MED ORDER — FUROSEMIDE 20 MG PO TABS
20.0000 mg | ORAL_TABLET | Freq: Every day | ORAL | Status: DC
  Administered 2018-06-07 – 2018-06-08 (×2): 20 mg via ORAL
  Filled 2018-06-07 (×2): qty 1

## 2018-06-07 MED ORDER — MAGNESIUM HYDROXIDE 400 MG/5ML PO SUSP: 15.0000 mL | Freq: Every day | ORAL | Status: DC | PRN

## 2018-06-07 MED ORDER — SODIUM CHLORIDE (PF) 0.9 % IJ SOLN
INTRAMUSCULAR | Status: AC
  Filled 2018-06-07: qty 10

## 2018-06-07 MED ORDER — ESCITALOPRAM OXALATE 10 MG PO TABS
10.0000 mg | ORAL_TABLET | Freq: Every day | ORAL | Status: DC
  Administered 2018-06-08: 10 mg via ORAL
  Filled 2018-06-07: qty 1

## 2018-06-07 MED ORDER — BUPIVACAINE-EPINEPHRINE 0.25% -1:200000 IJ SOLN
INTRAMUSCULAR | Status: DC | PRN
  Administered 2018-06-07: 13 mL

## 2018-06-07 MED ORDER — BUPIVACAINE-EPINEPHRINE (PF) 0.25% -1:200000 IJ SOLN
INTRAMUSCULAR | Status: AC
  Filled 2018-06-07: qty 30

## 2018-06-07 MED ORDER — PHENYLEPHRINE 40 MCG/ML (10ML) SYRINGE FOR IV PUSH (FOR BLOOD PRESSURE SUPPORT)
PREFILLED_SYRINGE | INTRAVENOUS | Status: AC
  Filled 2018-06-07: qty 10

## 2018-06-07 MED ORDER — HYDROMORPHONE HCL 1 MG/ML IJ SOLN
INTRAMUSCULAR | Status: AC
  Filled 2018-06-07: qty 1

## 2018-06-07 MED ORDER — FENTANYL CITRATE (PF) 100 MCG/2ML IJ SOLN
INTRAMUSCULAR | Status: AC
  Administered 2018-06-07: 50 ug via INTRAVENOUS
  Filled 2018-06-07: qty 2

## 2018-06-07 MED ORDER — PHENYLEPHRINE 40 MCG/ML (10ML) SYRINGE FOR IV PUSH (FOR BLOOD PRESSURE SUPPORT)
PREFILLED_SYRINGE | INTRAVENOUS | Status: DC | PRN
  Administered 2018-06-07: 120 ug via INTRAVENOUS
  Administered 2018-06-07: 80 ug via INTRAVENOUS

## 2018-06-07 MED ORDER — DEXAMETHASONE SODIUM PHOSPHATE 10 MG/ML IJ SOLN
INTRAMUSCULAR | Status: DC | PRN
  Administered 2018-06-07: 5 mg via INTRAVENOUS

## 2018-06-07 MED ORDER — ONDANSETRON HCL 4 MG/2ML IJ SOLN
INTRAMUSCULAR | Status: AC
  Filled 2018-06-07: qty 2

## 2018-06-07 MED ORDER — LOSARTAN POTASSIUM 50 MG PO TABS
100.0000 mg | ORAL_TABLET | Freq: Every day | ORAL | Status: DC
  Administered 2018-06-07 – 2018-06-08 (×2): 100 mg via ORAL
  Filled 2018-06-07 (×2): qty 2

## 2018-06-07 MED ORDER — ONDANSETRON 4 MG PO TBDP: 4.0000 mg | ORAL_TABLET | Freq: Four times a day (QID) | ORAL | Status: DC | PRN

## 2018-06-07 MED ORDER — INSULIN ASPART 100 UNIT/ML ~~LOC~~ SOLN
5.0000 [IU] | Freq: Once | SUBCUTANEOUS | Status: AC
  Administered 2018-06-07: 5 [IU] via SUBCUTANEOUS

## 2018-06-07 MED ORDER — CHLORHEXIDINE GLUCONATE CLOTH 2 % EX PADS: 6.0000 | MEDICATED_PAD | Freq: Once | CUTANEOUS | Status: DC

## 2018-06-07 MED ORDER — LIDOCAINE 2% (20 MG/ML) 5 ML SYRINGE
INTRAMUSCULAR | Status: AC
  Filled 2018-06-07: qty 5

## 2018-06-07 MED ORDER — FERROUS SULFATE 325 (65 FE) MG PO TABS
325.0000 mg | ORAL_TABLET | Freq: Every day | ORAL | Status: DC
  Administered 2018-06-07 – 2018-06-08 (×2): 325 mg via ORAL
  Filled 2018-06-07 (×2): qty 1

## 2018-06-07 MED ORDER — ENOXAPARIN SODIUM 30 MG/0.3ML ~~LOC~~ SOLN
30.0000 mg | SUBCUTANEOUS | Status: DC
  Administered 2018-06-08: 30 mg via SUBCUTANEOUS
  Filled 2018-06-07: qty 0.3

## 2018-06-07 MED ORDER — ALLOPURINOL 300 MG PO TABS
300.0000 mg | ORAL_TABLET | Freq: Every day | ORAL | Status: DC
  Administered 2018-06-08: 300 mg via ORAL
  Filled 2018-06-07: qty 1

## 2018-06-07 MED ORDER — DEXAMETHASONE SODIUM PHOSPHATE 10 MG/ML IJ SOLN
INTRAMUSCULAR | Status: AC
  Filled 2018-06-07: qty 1

## 2018-06-07 MED ORDER — MIDAZOLAM HCL 2 MG/2ML IJ SOLN
INTRAMUSCULAR | Status: AC
  Filled 2018-06-07: qty 2

## 2018-06-07 MED ORDER — VITAMIN C 500 MG PO TABS: 500.0000 mg | ORAL_TABLET | ORAL | Status: DC

## 2018-06-07 MED ORDER — ACETAMINOPHEN 500 MG PO TABS: 1000.0000 mg | ORAL_TABLET | Freq: Once | ORAL | Status: DC

## 2018-06-07 MED ORDER — LOSARTAN POTASSIUM-HCTZ 100-25 MG PO TABS: 1.0000 | ORAL_TABLET | Freq: Every day | ORAL | Status: DC

## 2018-06-07 MED ORDER — SENNA 8.6 MG PO TABS
1.0000 | ORAL_TABLET | Freq: Two times a day (BID) | ORAL | Status: DC
  Administered 2018-06-07 – 2018-06-08 (×2): 8.6 mg via ORAL
  Filled 2018-06-07 (×2): qty 1

## 2018-06-07 MED ORDER — INSULIN ASPART 100 UNIT/ML ~~LOC~~ SOLN
0.0000 [IU] | Freq: Three times a day (TID) | SUBCUTANEOUS | Status: DC
  Administered 2018-06-08 (×2): 4 [IU] via SUBCUTANEOUS

## 2018-06-07 MED ORDER — LINAGLIPTIN 5 MG PO TABS
5.0000 mg | ORAL_TABLET | Freq: Every day | ORAL | Status: DC
  Administered 2018-06-07 – 2018-06-08 (×2): 5 mg via ORAL
  Filled 2018-06-07 (×2): qty 1

## 2018-06-07 MED ORDER — LACTATED RINGERS IV SOLN: INTRAVENOUS | Status: DC

## 2018-06-07 MED ORDER — OXYBUTYNIN CHLORIDE 5 MG PO TABS
5.0000 mg | ORAL_TABLET | Freq: Every day | ORAL | Status: DC
  Administered 2018-06-07 – 2018-06-08 (×2): 5 mg via ORAL
  Filled 2018-06-07 (×2): qty 1

## 2018-06-07 MED ORDER — ONDANSETRON HCL 4 MG/2ML IJ SOLN: 4.0000 mg | Freq: Four times a day (QID) | INTRAMUSCULAR | Status: DC | PRN

## 2018-06-07 MED ORDER — POLYVINYL ALCOHOL 1.4 % OP SOLN
1.0000 [drp] | OPHTHALMIC | Status: DC | PRN
  Filled 2018-06-07: qty 15

## 2018-06-07 MED ORDER — ACETAMINOPHEN 500 MG PO TABS
1000.0000 mg | ORAL_TABLET | ORAL | Status: AC
  Administered 2018-06-07: 1000 mg via ORAL
  Filled 2018-06-07: qty 2

## 2018-06-07 MED ORDER — EPHEDRINE SULFATE-NACL 50-0.9 MG/10ML-% IV SOSY
PREFILLED_SYRINGE | INTRAVENOUS | Status: DC | PRN
  Administered 2018-06-07: 10 mg via INTRAVENOUS
  Administered 2018-06-07: 15 mg via INTRAVENOUS

## 2018-06-07 MED ORDER — FENTANYL CITRATE (PF) 100 MCG/2ML IJ SOLN
INTRAMUSCULAR | Status: DC | PRN
  Administered 2018-06-07 (×2): 50 ug via INTRAVENOUS
  Administered 2018-06-07: 25 ug via INTRAVENOUS
  Administered 2018-06-07: 50 ug via INTRAVENOUS
  Administered 2018-06-07: 25 ug via INTRAVENOUS

## 2018-06-07 MED ORDER — PANTOPRAZOLE SODIUM 40 MG PO TBEC
40.0000 mg | DELAYED_RELEASE_TABLET | Freq: Every day | ORAL | Status: DC
  Administered 2018-06-08: 40 mg via ORAL
  Filled 2018-06-07: qty 1

## 2018-06-07 MED ORDER — FLUTICASONE PROPIONATE 50 MCG/ACT NA SUSP
2.0000 | Freq: Every day | NASAL | Status: DC | PRN
  Filled 2018-06-07: qty 16

## 2018-06-07 MED ORDER — FENTANYL CITRATE (PF) 250 MCG/5ML IJ SOLN
INTRAMUSCULAR | Status: AC
  Filled 2018-06-07: qty 5

## 2018-06-07 MED ORDER — DEXTROSE 5 % IV SOLN
3.0000 g | INTRAVENOUS | Status: AC
  Administered 2018-06-07: 3 g via INTRAVENOUS
  Filled 2018-06-07: qty 3

## 2018-06-07 SURGICAL SUPPLY — 54 items
ADH SKN CLS APL DERMABOND .7 (GAUZE/BANDAGES/DRESSINGS) ×1
APPLIER CLIP 9.375 MED OPEN (MISCELLANEOUS) ×9
APR CLP MED 9.3 20 MLT OPN (MISCELLANEOUS) ×3
BINDER BREAST LRG (GAUZE/BANDAGES/DRESSINGS) IMPLANT
BINDER BREAST XLRG (GAUZE/BANDAGES/DRESSINGS) IMPLANT
BINDER BREAST XXLRG (GAUZE/BANDAGES/DRESSINGS) ×2 IMPLANT
BIOPATCH RED 1 DISK 7.0 (GAUZE/BANDAGES/DRESSINGS) ×1 IMPLANT
BIOPATCH RED 1IN DISK 7.0MM (GAUZE/BANDAGES/DRESSINGS) ×1
CANISTER SUCT 3000ML PPV (MISCELLANEOUS) ×3 IMPLANT
CHLORAPREP W/TINT 26ML (MISCELLANEOUS) ×3 IMPLANT
CLIP APPLIE 9.375 MED OPEN (MISCELLANEOUS) ×1 IMPLANT
CONT SPEC 4OZ CLIKSEAL STRL BL (MISCELLANEOUS) ×3 IMPLANT
COVER PROBE W GEL 5X96 (DRAPES) ×3 IMPLANT
COVER SURGICAL LIGHT HANDLE (MISCELLANEOUS) ×3 IMPLANT
COVER WAND RF STERILE (DRAPES) ×3 IMPLANT
DERMABOND ADVANCED (GAUZE/BANDAGES/DRESSINGS) ×2
DERMABOND ADVANCED .7 DNX12 (GAUZE/BANDAGES/DRESSINGS) ×1 IMPLANT
DEVICE DUBIN SPECIMEN MAMMOGRA (MISCELLANEOUS) ×3 IMPLANT
DRAIN CHANNEL 19F RND (DRAIN) ×2 IMPLANT
DRAPE CHEST BREAST 15X10 FENES (DRAPES) ×3 IMPLANT
DRAPE HALF SHEET 40X57 (DRAPES) ×3 IMPLANT
DRSG PAD ABDOMINAL 8X10 ST (GAUZE/BANDAGES/DRESSINGS) ×5 IMPLANT
DRSG TEGADERM 2-3/8X2-3/4 SM (GAUZE/BANDAGES/DRESSINGS) ×2 IMPLANT
ELECT BLADE 4.0 EZ CLEAN MEGAD (MISCELLANEOUS) ×3
ELECT CAUTERY BLADE 6.4 (BLADE) ×5 IMPLANT
ELECT REM PT RETURN 9FT ADLT (ELECTROSURGICAL) ×3
ELECTRODE BLDE 4.0 EZ CLN MEGD (MISCELLANEOUS) IMPLANT
ELECTRODE REM PT RTRN 9FT ADLT (ELECTROSURGICAL) ×1 IMPLANT
EVACUATOR SILICONE 100CC (DRAIN) ×2 IMPLANT
FILTER STRAW FLUID ASPIR (MISCELLANEOUS) IMPLANT
GAUZE SPONGE 4X4 12PLY STRL (GAUZE/BANDAGES/DRESSINGS) ×3 IMPLANT
GLOVE EUDERMIC 7 POWDERFREE (GLOVE) ×3 IMPLANT
GOWN STRL REUS W/ TWL LRG LVL3 (GOWN DISPOSABLE) ×1 IMPLANT
GOWN STRL REUS W/ TWL XL LVL3 (GOWN DISPOSABLE) ×1 IMPLANT
GOWN STRL REUS W/TWL LRG LVL3 (GOWN DISPOSABLE) ×3
GOWN STRL REUS W/TWL XL LVL3 (GOWN DISPOSABLE) ×3
KIT BASIN OR (CUSTOM PROCEDURE TRAY) ×3 IMPLANT
KIT MARKER MARGIN INK (KITS) ×3 IMPLANT
NDL HYPO 25GX1X1/2 BEV (NEEDLE) ×1 IMPLANT
NDL SAFETY ECLIPSE 18X1.5 (NEEDLE) IMPLANT
NEEDLE HYPO 18GX1.5 SHARP (NEEDLE)
NEEDLE HYPO 25GX1X1/2 BEV (NEEDLE) ×3 IMPLANT
NS IRRIG 1000ML POUR BTL (IV SOLUTION) ×3 IMPLANT
PACK GENERAL/GYN (CUSTOM PROCEDURE TRAY) ×3 IMPLANT
SPONGE LAP 18X18 RF (DISPOSABLE) ×2 IMPLANT
SPONGE LAP 4X18 RFD (DISPOSABLE) ×3 IMPLANT
SUT ETHILON 3 0 FSL (SUTURE) ×2 IMPLANT
SUT MNCRL AB 4-0 PS2 18 (SUTURE) ×6 IMPLANT
SUT SILK 2 0 SH (SUTURE) ×3 IMPLANT
SUT VIC AB 2-0 SH 18 (SUTURE) ×2 IMPLANT
SUT VIC AB 3-0 SH 18 (SUTURE) ×5 IMPLANT
SYR CONTROL 10ML LL (SYRINGE) ×3 IMPLANT
TOWEL OR 17X24 6PK STRL BLUE (TOWEL DISPOSABLE) ×3 IMPLANT
TOWEL OR 17X26 10 PK STRL BLUE (TOWEL DISPOSABLE) ×3 IMPLANT

## 2018-06-07 NOTE — Transfer of Care (Signed)
Immediate Anesthesia Transfer of Care Note  Patient: Stacy Walls  Procedure(s) Performed: RIGHT BREAST LUMPECTOMY WITH BRACKETED RADIOACTIVE SEEDS AND RIGHT COMPLETE AXILLARY LYMPH NODE DISSECTION (Right Breast)  Patient Location: PACU  Anesthesia Type:General  Level of Consciousness: awake, alert  and patient cooperative  Airway & Oxygen Therapy: Patient Spontanous Breathing  Post-op Assessment: Report given to RN and Post -op Vital signs reviewed and stable  Post vital signs: Reviewed and stable  Last Vitals:  Vitals Value Taken Time  BP 155/74 06/07/2018  4:44 PM  Temp    Pulse 88 06/07/2018  4:45 PM  Resp 21 06/07/2018  4:45 PM  SpO2 98 % 06/07/2018  4:45 PM  Vitals shown include unvalidated device data.  Last Pain:  Vitals:   06/07/18 1405  TempSrc:   PainSc: 0-No pain      Patients Stated Pain Goal: 4 (98/06/99 9672)  Complications: No apparent anesthesia complications

## 2018-06-07 NOTE — Anesthesia Procedure Notes (Signed)
Anesthesia Regional Block: Pectoralis block   Pre-Anesthetic Checklist: ,, timeout performed, Correct Patient, Correct Site, Correct Laterality, Correct Procedure, Correct Position, site marked, Risks and benefits discussed, pre-op evaluation,  At surgeon's request and post-op pain management  Laterality: Right  Prep: Maximum Sterile Barrier Precautions used, chloraprep       Needles:  Injection technique: Single-shot  Needle Type: Echogenic Stimulator Needle     Needle Length: 9cm  Needle Gauge: 21     Additional Needles:   Procedures:,,,, ultrasound used (permanent image in chart),,,,  Narrative:  Start time: 06/07/2018 1:40 PM End time: 06/07/2018 1:50 PM Injection made incrementally with aspirations every 5 mL. Anesthesiologist: Roderic Palau, MD  Additional Notes: 2% Lidocaine skin wheel.

## 2018-06-07 NOTE — Op Note (Signed)
Patient Name:           Stacy Walls   Date of Surgery:        06/07/2018  Pre op Diagnosis:      Multifocal cancer right breast with metastasis to movable ipsilateral axillary lymph nodes  Post op Diagnosis:    Same  Procedure:                 Right breast quadrantectomy with bracketed radioactive seed localization,                                      complete right axillary lymph node dissection,                                      Reexcision inferior margin  Surgeon:                     Edsel Petrin. Dalbert Batman, M.D., FACS  Assistant:                      Sharyn Dross, RNFA  Operative Indications:   This is an 85 year old woman is brought to the hospital for definitive surgery for her right breast cancer. Her PCP is Bradd Burner. Her oncologist is Dr. Burr Medico Her cardiologist is Dr. Marlou Porch.       She has no prior history of breast disease. No mammograms for 10 years. Recent imaging studies showed a 1.1 cm mass in the right breast at the 12 o'clock position 7 cm from the nipple and a second mass in the right breast, also 12:00 5 cm from the nipple 1 cm in size. These were said to be 1.3 cm apart. There was also some coarse clustered calcifications slightly lateral to the known cancer 8 mm diameter. This was 1.2 cm lateral to the small cancers At least 3 abnormal lymph nodes by ultrasound the 2 masses at the 12 o'clock position were biopsied and showed receptor positive invasive lobular carcinoma. HER-2 negative. Ki-67 10% The 8 mm area of calcification slightly lateral was not biopsied 1 axillary lymph node was biopsied is consistent with invasive lobular carcinoma She went and had an MRI and there was actually a fourth area nearby was biopsied also showed invasive lobular carcinoma. The MRI also showed at least 4 abnormal lymph nodes I have reviewed all of these imaging studies with Dr. Evangeline Dakin. It appears that although she has multifocal cancer it is limited to  the upper quadrant of her right breast. This is a spherical area at least 5 cm in diameter. We have decided that we would like to bracket this with 2 radioactive seeds from superior to inferior      She has seen Dr. Burr Medico. She has been discussed at breast conference. CT scans and bone scan are negative for metastatic disease. She has huge amount of comorbidities and we would like to avoid a mastectomy on her large breast and I think we can do that Consensus recommendation is generous right breast lumpectomy with bracketed radioactive seed localization and complete right axillary lymph node dissection. She will need radiation therapy She is not a candidate for chemotherapy Possibly she will receive antiestrogen therapy        Comorbidities include coronary artery disease without MI. Cholecystectomy. Non-insulin-dependent diabetes  with CKD. Secondary hyperparathyroidism. C-section through a low midline incision. Left total knee replacement. Hypothyroidism. Developed C. difficile colitis following her total knee replacement that resolved. Family history is negative for breast, ovarian or pancreatic cancer. Mother  may have died of liver cancer. It is notable that her husband died 3 weeks ago.      She has seen Dr. Marlou Porch and has received cardiac clearance      She'll be scheduled for right breast lumpectomy with bracketed radioactive seed localization Orient seeds superior to inferior, Complete right axillary lymph node dissection  She agrees with this plan.  Operative Findings:       A complete level 1 and level 2 right axillary lymph node dissection was performed.  There were cervical mobile enlarged lymph nodes, some appeared possibly matted to each other.  There was no invasion of the chest wall.  We preserved the thoracodorsal neurovascular bundle and the long thoracic nerve.  The right breast lumpectomy was performed through a curvilinear transverse incision in the superior pole of the  breast.  The specimen mammogram looked good showing both seeds and both marker clips and there appeared to be margins all around.  I wondered if I was close and so I reexcised the inferior margin.  As expected, there is volume loss in the superior pole of the right breast  Procedure in Detail:          Following the induction of general LMA anesthesia the patient's entire right chest wall and axilla were prepped and draped in a sterile fashion.  Surgical timeout was performed.  Intravenous antibiotics were given.  She had a pectoral block performed in the holding area.  0.25% Marcaine with epinephrine was used as local infiltration anesthetic in the skin.       Using the neoprobe I mapped out the 2 radioactive seeds and marked their location on the skin.  One was superior and one was inferior.  This lent itself to a transverse curvilinear incision in the skin lines of the superior breast.  This incision was made.  A generous lumpectomy was performed using the neoprobe and electrocautery.  The specimen mammogram looked good as described above.  I reexcised the inferior margin and marked that with ink as well.  The wound was copiously irrigated.  Hemostasis was excellent.  I placed 6 metal marker clips in the walls of the lumpectomy cavity.  The lumpectomy cavity was closed in layers with 2-0 and 3-0 Vicryl sutures and the skin closed with a running subcuticular 4-0 Monocryl and Dermabond.     I then made a transverse incision in the skin lines of the right axilla at the hairline.  Dissection was carried down through the clavipectoral fascia.  Hand-held retractors were placed.  I dissected the lateral border of the pectoralis major and pectoralis minor muscles away from the axillary space.  I carried the dissection up along the chest wall until I identified the axillary vein.  I cleaned off the superficial tributaries of the axillary vein controlling them with metal clips and divided them.  Lymphatics and small  nerves were controlled with metal clips and divided.  I took the dissection down to the thoracodorsal neurovascular bundle which was preserved.  All of the axillary contents were then either dissected or swept out of the axilla.  This was a large packet of  Nodes, and it was sent as a separate specimen to the pathology lab.  Hemostasis in the axilla was excellent.  Achieved with electrocautery and numerous metal clips.  After irrigating the axilla I placed a 82 French Blake drain into the axilla and brought out through a separate stab incision inferiorly, sutured to the skin and connected to a suction bulb.  The clavipectoral fascia was closed with interrupted 3-0 Vicryl and the skin closed with a running subcuticular 4-0 Monocryl and Dermabond.  Clean dry bandages and a breast binder were placed.  The patient tolerated the procedure well was taken to PACU in stable condition.  EBL 40 to 50 cc.  Counts correct.  Complications none.   Addendum: I logged onto the PMP aware website and reviewed her prescription medication history.     Edsel Petrin. Dalbert Batman, M.D., FACS General and Minimally Invasive Surgery Breast and Colorectal Surgery  06/07/2018 4:33 PM

## 2018-06-07 NOTE — Interval H&P Note (Signed)
History and Physical Interval Note:  06/07/2018 1:04 PM  Stacy Walls  has presented today for surgery, with the diagnosis of RIGHT BREAST CANCER  The various methods of treatment have been discussed with the patient and family. After consideration of risks, benefits and other options for treatment, the patient has consented to  Procedure(s): RIGHT BREAST LUMPECTOMY WITH BRACKETED RADIOACTIVE SEEDS AND RIGHT COMPLETE AXILLARY LYMPH NODE DISSECTION (Right) as a surgical intervention .  The patient's history has been reviewed, patient examined, no change in status, stable for surgery.  I have reviewed the patient's chart and labs.  Questions were answered to the patient's satisfaction.     Adin Hector

## 2018-06-07 NOTE — Anesthesia Postprocedure Evaluation (Signed)
Anesthesia Post Note  Patient: Stacy Walls  Procedure(s) Performed: RIGHT BREAST LUMPECTOMY WITH BRACKETED RADIOACTIVE SEEDS AND RIGHT COMPLETE AXILLARY LYMPH NODE DISSECTION (Right Breast)     Patient location during evaluation: PACU Anesthesia Type: General Level of consciousness: awake and alert Pain management: pain level controlled Vital Signs Assessment: post-procedure vital signs reviewed and stable Respiratory status: spontaneous breathing, nonlabored ventilation, respiratory function stable and patient connected to nasal cannula oxygen Cardiovascular status: blood pressure returned to baseline and stable Postop Assessment: no apparent nausea or vomiting Anesthetic complications: no    Last Vitals:  Vitals:   06/07/18 1644 06/07/18 1659  BP: (!) 155/74 (!) 148/68  Pulse: 87 77  Resp: 16 15  Temp: (!) 36.4 C   SpO2: 99% 97%    Last Pain:  Vitals:   06/07/18 1712  TempSrc:   PainSc: 7                  Marquon Alcala

## 2018-06-07 NOTE — Anesthesia Preprocedure Evaluation (Addendum)
Anesthesia Evaluation  Patient identified by MRN, date of birth, ID band Patient awake    Reviewed: Allergy & Precautions, H&P , NPO status , Patient's Chart, lab work & pertinent test results  Airway Mallampati: I  TM Distance: >3 FB Neck ROM: Full    Dental no notable dental hx. (+) Partial Lower, Partial Upper, Dental Advisory Given   Pulmonary neg pulmonary ROS,    Pulmonary exam normal breath sounds clear to auscultation       Cardiovascular hypertension, Pt. on medications  Rhythm:Regular Rate:Normal     Neuro/Psych  Headaches, Depression    GI/Hepatic Neg liver ROS, GERD  Medicated and Controlled,  Endo/Other  diabetes, Type 2, Oral Hypoglycemic AgentsHypothyroidism   Renal/GU Renal InsufficiencyRenal disease  negative genitourinary   Musculoskeletal  (+) Arthritis , Osteoarthritis,    Abdominal   Peds  Hematology  (+) Blood dyscrasia, anemia ,   Anesthesia Other Findings   Reproductive/Obstetrics negative OB ROS                            Anesthesia Physical Anesthesia Plan  ASA: III  Anesthesia Plan: General   Post-op Pain Management:  Regional for Post-op pain   Induction: Intravenous  PONV Risk Score and Plan: 4 or greater and Ondansetron and Dexamethasone  Airway Management Planned: LMA  Additional Equipment:   Intra-op Plan:   Post-operative Plan: Extubation in OR  Informed Consent: I have reviewed the patients History and Physical, chart, labs and discussed the procedure including the risks, benefits and alternatives for the proposed anesthesia with the patient or authorized representative who has indicated his/her understanding and acceptance.     Dental advisory given  Plan Discussed with: CRNA  Anesthesia Plan Comments:        Anesthesia Quick Evaluation

## 2018-06-07 NOTE — Anesthesia Procedure Notes (Signed)
Procedure Name: LMA Insertion Date/Time: 06/07/2018 2:40 PM Performed by: Elayne Snare, CRNA Pre-anesthesia Checklist: Patient identified, Emergency Drugs available, Suction available and Patient being monitored Patient Re-evaluated:Patient Re-evaluated prior to induction Oxygen Delivery Method: Circle System Utilized Preoxygenation: Pre-oxygenation with 100% oxygen Induction Type: IV induction Ventilation: Mask ventilation without difficulty LMA: LMA inserted LMA Size: 4.0 Number of attempts: 1 Placement Confirmation: positive ETCO2 Tube secured with: Tape Dental Injury: Teeth and Oropharynx as per pre-operative assessment

## 2018-06-08 ENCOUNTER — Encounter (HOSPITAL_COMMUNITY): Payer: Self-pay | Admitting: General Surgery

## 2018-06-08 DIAGNOSIS — C50411 Malignant neoplasm of upper-outer quadrant of right female breast: Secondary | ICD-10-CM | POA: Diagnosis not present

## 2018-06-08 LAB — BASIC METABOLIC PANEL
Anion gap: 8 (ref 5–15)
BUN: 28 mg/dL — ABNORMAL HIGH (ref 8–23)
CO2: 26 mmol/L (ref 22–32)
Calcium: 9.8 mg/dL (ref 8.9–10.3)
Chloride: 100 mmol/L (ref 98–111)
Creatinine, Ser: 1.67 mg/dL — ABNORMAL HIGH (ref 0.44–1.00)
GFR calc Af Amer: 32 mL/min — ABNORMAL LOW (ref >60)
GFR calc non Af Amer: 28 mL/min — ABNORMAL LOW (ref >60)
Glucose, Bld: 182 mg/dL — ABNORMAL HIGH (ref 70–99)
Potassium: 3.7 mmol/L (ref 3.5–5.1)
Sodium: 134 mmol/L — ABNORMAL LOW (ref 135–145)

## 2018-06-08 LAB — CBC
HCT: 31.1 % — ABNORMAL LOW (ref 36.0–46.0)
Hemoglobin: 10.1 g/dL — ABNORMAL LOW (ref 12.0–15.0)
MCH: 31.2 pg (ref 26.0–34.0)
MCHC: 32.5 g/dL (ref 30.0–36.0)
MCV: 96 fL (ref 80.0–100.0)
Platelets: 136 10*3/uL — ABNORMAL LOW (ref 150–400)
RBC: 3.24 MIL/uL — ABNORMAL LOW (ref 3.87–5.11)
RDW: 12.6 % (ref 11.5–15.5)
WBC: 11.5 10*3/uL — ABNORMAL HIGH (ref 4.0–10.5)
nRBC: 0 % (ref 0.0–0.2)

## 2018-06-08 LAB — GLUCOSE, CAPILLARY
Glucose-Capillary: 153 mg/dL — ABNORMAL HIGH (ref 70–99)
Glucose-Capillary: 171 mg/dL — ABNORMAL HIGH (ref 70–99)

## 2018-06-08 MED ORDER — HYDROCODONE-ACETAMINOPHEN 5-325 MG PO TABS: 1.0000 | ORAL_TABLET | Freq: Four times a day (QID) | ORAL | 0 refills | Status: DC | PRN

## 2018-06-08 MED ORDER — MENTHOL 3 MG MT LOZG
1.0000 | LOZENGE | OROMUCOSAL | Status: DC | PRN
  Administered 2018-06-08: 3 mg via ORAL
  Filled 2018-06-08: qty 9

## 2018-06-08 NOTE — Care Management Note (Signed)
Case Management Note  Patient Details  Name: Stacy Walls MRN: [id number] Date of Birth: Jul 06, 1932  Subjective/Objective:                    Action/Plan:  Confirmed face sheet information. Daughter [name] requesting Home health RN and aide. Daughter aware HHRN and aide do not make daily visits. Wound and JP care teaching done by bedside nurse.   Medicare.gov list provided. Requesting [name]. Referral given to and accepted by [name] with [name]. Expected Discharge Date:  06/08/18               Expected Discharge Plan:  [place]  In-House Referral:  NA  Discharge planning Services  CM Consult  Post Acute Care Choice:  Home Health Choice offered to:  Patient, Adult Children  DME Arranged:  N/A DME Agency:  NA  HH Arranged:  RN, Nurse's Aide [place] Agency:  NA  Status of Service:  Completed, signed off  If discussed at [place] of Stay Meetings, dates discussed:    Additional Comments:  [name], RN 06/08/2018, 10:56 AM

## 2018-06-08 NOTE — Discharge Summary (Signed)
Patient ID: Stacy Walls [id number] 85 y.o. December 22, 1932  Admit date: 06/07/2018  Discharge date and time: 06/08/2018  Admitting Physician: Adin Hector  Discharge Physician: Adin Hector  Admission Diagnoses: RIGHT BREAST CANCER  Discharge Diagnoses: Multifocal cancer right breast with axillary lymph node metastasis                                         Coronary artery disease, occlusive                                          Type 2 diabetes treated without insulin                                          BMI 45                                          Secondary hyperparathyroidism, renal                                          History of C. difficile colitis                                          History total knee replacement, left                                           Hypothyroidism, adult                                           History cholecystectomy                                            History C-section  Operations: Procedure(s): RIGHT BREAST LUMPECTOMY WITH BRACKETED RADIOACTIVE SEEDS AND RIGHT COMPLETE AXILLARY LYMPH NODE DISSECTION  Admission Condition: good  Discharged Condition: good  Indication for Admission: This is an 85 year old woman is brought to the hospital for definitive surgery for her right breast cancer. Her PCP is Bradd Burner. Her oncologist is Dr. Burr Medico Her cardiologist is Dr. Marlou Porch. She has no prior history of breast disease. No mammograms for 10 years. Recent imaging studies showed a 1.1 cm mass in the right breast at the 12 o'clock position 7 cm from the nipple and a second mass in the right breast, also 12:00 5 cm from the nipple 1 cm in size. These were said to be 1.3 cm apart. There was also some coarse clustered calcifications slightly lateral to the known cancer 8 mm diameter. This was 1.2 cm lateral to the small  cancers At least 3 abnormal lymph nodes by ultrasound the 2 masses at the 12  o'clock position were biopsied and showed receptor positive invasive lobular carcinoma. HER-2 negative. Ki-67 10% The 8 mm area of calcification slightly lateral was not biopsied 1 axillary lymph node was biopsied is consistent with invasive lobular carcinoma She went and had an MRI and there was actually a fourth area nearby was biopsied also showed invasive lobular carcinoma. The MRI also showed at least 4 abnormal lymph nodes I have reviewed all of these imaging studies with Dr. Evangeline Dakin. It appears that although she has multifocal cancer it is limited to the upper quadrant of her right breast. This is a spherical area at least 5 cm in diameter. We have decided that we would like to bracket this with 2 radioactive seeds from superior to inferior She has seen Dr. Burr Medico. She has been discussed at breast conference. CT scans and bone scan are negative for metastatic disease. She has huge amount of comorbidities and we would like to avoid a mastectomy on her large breast and I think we can do that Consensus recommendation is generous right breast lumpectomy with bracketed radioactive seed localization and complete right axillary lymph node dissection. She will need radiation therapy She is not a candidate for chemotherapy Possibly she will receive antiestrogen therapy Comorbidities include coronary artery disease without MI. Cholecystectomy. Non-insulin-dependent diabetes with CKD. Secondary hyperparathyroidism. C-section through a low midline incision. Left total knee replacement. Hypothyroidism. Developed C. difficile colitis following her total knee replacement that resolved. Family history is negative for breast, ovarian or pancreatic cancer. Mother  may have died of liver cancer. It is notable that her husband died 3 weeks ago. She has seen Dr. Marlou Porch and has received cardiac clearance She'll be scheduled for right breast lumpectomy with bracketed  radioactive seed localization Orient seeds superior to inferior, Complete right axillary lymph node dissection  She agrees with this plan.  Hospital Course: On the day of admission the patient was taken to the operating room.  She underwent right breast lumpectomy with bracketed radioactive seed localization and complete right axillary lymph node dissection.  The surgery went well.    Operative findings were A complete level 1 and level 2 right axillary lymph node dissection was performed.  There were cervical mobile enlarged lymph nodes, some appeared possibly matted to each other.  There was no invasion of the chest wall.  We preserved the thoracodorsal neurovascular bundle and the long thoracic nerve.  The right breast lumpectomy was performed through a curvilinear transverse incision in the superior pole of the breast.  The specimen mammogram looked good showing both seeds and both marker clips and there appeared to be margins all around.  I wondered if I was close and so I reexcised the inferior margin.  As expected, there is volume loss in the superior pole of the right breast     She was observed overnight and did well.  She was anxious to go home the following morning.  Pain control was good.  She tolerated diet.  Voiding uneventfully.  She was able to ambulate with a walker.  She ambulates with a cane at home.     Discharge examination revealed that she is alert and pleasant and in no distress.  The right lumpectomy incision and the right axillary incision looked good.  There was no hematoma or swelling or active bleeding.  Skin flaps were viable.  Drainage volume was low, thin, serosanguineous.  Minimal  sensory deficit right arm.  Lungs were clear.  CBGs were adequately controlled.     She was given instructions in diet, wound care, drain care, activities.  She was urged to use the incentive spirometer and perform shoulder range of motion exercises 10 times a day.  She was reminded to take  something twice a day for constipation such as Colace or Senokot.    She was given patient instruction sheet on the use of Tylenol and acetaminophen.  I called in a prescription for Norco to her pharmacy in case she needs it.     Follow-up appointment with me in 8 to 9 days, end of next week.  Consults: None  Significant Diagnostic Studies: Surgical pathology, pending  Treatments: surgery: Right breast lumpectomy with bracketed radioactive seed localization, complete right axillary lymph node dissection  Disposition: Home  Patient Instructions:  Allergies as of 06/08/2018      Reactions   Ace Inhibitors Cough   Azithromycin Other (See Comments)   Unknown   Pioglitazone Other (See Comments)   REACTION: edema   Sulfonamide Derivatives Rash   Tramadol Other (See Comments)   dizziness      Medication List    TAKE these medications   allopurinol 300 MG tablet Commonly known as:  ZYLOPRIM Take 1 tablet (300 mg total) by mouth daily.   ALPRAZolam 0.25 MG tablet Commonly known as:  XANAX Take 0.25 mg by mouth 3 (three) times daily as needed for anxiety.   aspirin EC 81 MG tablet Take 81 mg by mouth daily.   diclofenac sodium 1 % Gel Commonly known as:  VOLTAREN APPLY 2 GRAMS EXTERNALLY TO THE AFFECTED AREA FOUR TIMES DAILY   escitalopram 10 MG tablet Commonly known as:  LEXAPRO Take 10 mg by mouth daily.   fluticasone 50 MCG/ACT nasal spray Commonly known as:  FLONASE Place 2 sprays into both nostrils daily as needed.   furosemide 20 MG tablet Commonly known as:  LASIX Take 1 tablet (20 mg total) by mouth daily.   hydrOXYzine 25 MG capsule Commonly known as:  VISTARIL Take 25 mg by mouth at bedtime as needed for anxiety.   Iron 325 (65 Fe) MG Tabs Take 325 mg by mouth daily.   levothyroxine 88 MCG tablet Commonly known as:  SYNTHROID, LEVOTHROID Take 1 tablet (88 mcg total) by mouth daily.   losartan-hydrochlorothiazide 100-25 MG tablet Commonly known as:   HYZAAR Take 1 tablet by mouth daily.   magnesium hydroxide 400 MG/5ML suspension Commonly known as:  MILK OF MAGNESIA Take 15 mLs by mouth daily as needed for mild constipation.   MULTIVITAMIN WOMEN 50+ PO Take 1 tablet by mouth daily.   omeprazole 20 MG capsule Commonly known as:  PRILOSEC TAKE 1 CAPSULE(20 MG) BY MOUTH DAILY What changed:  See the new instructions.   oxybutynin 5 MG tablet Commonly known as:  DITROPAN 1 tab daily What changed:    how much to take  how to take this  when to take this  additional instructions   PROCRIT 07680 UNIT/ML injection Generic drug:  epoetin alfa Inject 10,000 Units into the skin every 30 (thirty) days. Dr. Justin Mend   sitaGLIPtin 100 MG tablet Commonly known as:  JANUVIA TAKE 1/2 TABLET(50 MG) BY MOUTH DAILY What changed:    how much to take  how to take this  when to take this  additional instructions   SYSTANE 0.4-0.3 % Soln Generic drug:  Polyethyl Glycol-Propyl Glycol Place 1 drop into  both eyes daily as needed (for dry eyes).   triamcinolone ointment 0.1 % Commonly known as:  KENALOG APPLY TOPICALLY TO THE AFFECTED AREA THREE TIMES DAILY AS NEEDED FOR ITCHING What changed:  See the new instructions.   vitamin C 500 MG tablet Commonly known as:  ASCORBIC ACID Take 500 mg by mouth 2 (two) times a week.            Discharge Care Instructions  (From admission, onward)         Start     Ordered   06/08/18 0000  Discharge wound care:    Comments:  Change the bandages with dry gauze if they become wet or soiled Keep the elastic bandage in place for a few days  Keep a written record of the drainage Bring the written record of the drainage to the office with you each time  You may take a quick shower, starting on Friday If it is easier to take a sponge bath, that is okay as well Pat the wounds and the drain site until they are dry and replace the bandage Do not take a tub bath   06/08/18 0758           Activity: Ambulate with walker or cane at home is much as possible. Diet: diabetic diet Wound Care: as directed  Follow-up:  With Dr. Dalbert Batman in 8-9 days    Addendum: I logged onto the PMP aware website and reviewed her prescription medication history.  Signed: Edsel Petrin. Dalbert Batman, M.D., FACS General and minimally invasive surgery Breast and Colorectal Surgery  06/08/2018, 8:00 AM

## 2018-06-08 NOTE — Discharge Instructions (Signed)
[facility] °Office Phone Number [phone] ° °BREAST BIOPSY/ PARTIAL MASTECTOMY: POST OP INSTRUCTIONS ° °Always review your discharge instruction sheet given to you by the facility where your surgery was performed. ° °IF YOU HAVE DISABILITY OR FAMILY LEAVE FORMS, YOU MUST BRING THEM TO THE OFFICE FOR PROCESSING.  DO NOT GIVE THEM TO YOUR DOCTOR. ° °1. A prescription for pain medication may be given to you upon discharge.  Take your pain medication as prescribed, if needed.  If narcotic pain medicine is not needed, then you may take acetaminophen (Tylenol) or ibuprofen (Advil) as needed. °2. Take your usually prescribed medications unless otherwise directed °3. If you need a refill on your pain medication, please contact your pharmacy.  They will contact our office to request authorization.  Prescriptions will not be filled after 5pm or on week-ends. °4. You should eat very light the first 24 hours after surgery, such as soup, crackers, pudding, etc.  Resume your normal diet the day after surgery. °5. Most patients will experience some swelling and bruising in the breast.  Ice packs and a good support bra will help.  Swelling and bruising can take several days to resolve.  °6. It is common to experience some constipation if taking pain medication after surgery.  Increasing fluid intake and taking a stool softener will usually help or prevent this problem from occurring.  A mild laxative (Milk of Magnesia or Miralax) should be taken according to package directions if there are no bowel movements after 48 hours. °7. Unless discharge instructions indicate otherwise, you may remove your bandages 24-48 hours after surgery, and you may shower at that time.  You may have steri-strips (small skin tapes) in place directly over the incision.  These strips should be left on the skin for 7-10 days.  If your surgeon used skin glue on the incision, you may shower in 24 hours.  The glue will flake off over the  next 2-3 weeks.  Any sutures or staples will be removed at the office during your follow-up visit. °8. ACTIVITIES:  You may resume regular daily activities (gradually increasing) beginning the next day.  Wearing a good support bra or sports bra minimizes pain and swelling.  You may have sexual intercourse when it is comfortable. °a. You may drive when you no longer are taking prescription pain medication, you can comfortably wear a seatbelt, and you can safely maneuver your car and apply brakes. °b. RETURN TO WORK:  ______________________________________________________________________________________ °9. You should see your doctor in the office for a follow-up appointment approximately two weeks after your surgery.  Your doctor’s nurse will typically make your follow-up appointment when she calls you with your pathology report.  Expect your pathology report 2-3 business days after your surgery.  You may call to check if you do not hear from us after three days. °10. OTHER INSTRUCTIONS: _______________________________________________________________________________________________ _____________________________________________________________________________________________________________________________________ °_____________________________________________________________________________________________________________________________________ °_____________________________________________________________________________________________________________________________________ ° °WHEN TO CALL YOUR DOCTOR: °1. Fever over 101.0 °2. Nausea and/or vomiting. °3. Extreme swelling or bruising. °4. Continued bleeding from incision. °5. Increased pain, redness, or drainage from the incision. ° °The clinic staff is available to answer your questions during regular business hours.  Please don’t hesitate to call and ask to speak to one of the nurses for clinical concerns.  If you have a medical emergency, go to the nearest  emergency room or call 911.  A surgeon from [facility] is always on call at the hospital. ° °For further questions, please visit [email or web address]  ° ° ° ° ° ° ° ° ° ° °••••••••• ° ° °  Managing Your Pain After Surgery Without Opioids ° ° ° °Thank you for participating in our program to help patients manage their pain after surgery without opioids. This is part of our effort to provide you with the best care possible, without exposing you or your family to the risk that opioids pose. ° °What pain can I expect after surgery? °You can expect to have some pain after surgery. This is normal. The pain is typically worse the day after surgery, and quickly begins to get better. °Many studies have found that many patients are able to manage their pain after surgery with Over-the-Counter (OTC) medications such as Tylenol and Motrin. If you have a condition that does not allow you to take Tylenol or Motrin, notify your surgical team. ° °How will I manage my pain? °The best strategy for controlling your pain after surgery is around the clock pain control with Tylenol (acetaminophen) and Motrin (ibuprofen or Advil). Alternating these medications with each other allows you to maximize your pain control. In addition to Tylenol and Motrin, you can use heating pads or ice packs on your incisions to help reduce your pain. ° °How will I alternate your regular strength over-the-counter pain medication? °You will take a dose of pain medication every three hours. °; Start by taking 650 mg of Tylenol (2 pills of 325 mg) °; 3 hours later take 600 mg of Motrin (3 pills of 200 mg) °; 3 hours after taking the Motrin take 650 mg of Tylenol °; 3 hours after that take 600 mg of Motrin. ° ° °- 1 - ° °See example - if your first dose of Tylenol is at 12:00 PM ° ° °12:00 PM Tylenol 650 mg (2 pills of 325 mg)  °3:00 PM Motrin 600 mg (3 pills of 200 mg)  °6:00 PM Tylenol 650 mg (2 pills of 325 mg)  °9:00 PM Motrin 600 mg (3  pills of 200 mg)  °Continue alternating every 3 hours  ° °We recommend that you follow this schedule around-the-clock for at least 3 days after surgery, or until you feel that it is no longer needed. Use the table on the last page of this handout to keep track of the medications you are taking. °Important: °Do not take more than 3000mg of Tylenol or 3200mg of Motrin in a 24-hour period. °Do not take ibuprofen/Motrin if you have a history of bleeding stomach ulcers, severe kidney disease, &/or actively taking a blood thinner ° °What if I still have pain? °If you have pain that is not controlled with the over-the-counter pain medications (Tylenol and Motrin or Advil) you might have what we call “breakthrough” pain. You will receive a prescription for a small amount of an opioid pain medication such as Oxycodone, Tramadol, or Tylenol with Codeine. Use these opioid pills in the first 24 hours after surgery if you have breakthrough pain. Do not take more than 1 pill every 4-6 hours. ° °If you still have uncontrolled pain after using all opioid pills, don't hesitate to call our staff using the number provided. We will help make sure you are managing your pain in the best way possible, and if necessary, we can provide a prescription for additional pain medication. ° ° °Day 1   ° °Time  °Name of Medication Number of pills taken  °Amount of Acetaminophen  °Pain Level  ° °Comments  °AM PM       °AM PM       °AM PM       °  AM PM       °AM PM       °AM PM       °AM PM       °AM PM       °Total Daily amount of Acetaminophen °Do not take more than  3,000 mg per day    ° ° °Day 2   ° °Time  °Name of Medication Number of pills °taken  °Amount of Acetaminophen  °Pain Level  ° °Comments  °AM PM       °AM PM       °AM PM       °AM PM       °AM PM       °AM PM       °AM PM       °AM PM       °Total Daily amount of Acetaminophen °Do not take more than  3,000 mg per day    ° ° °Day 3   ° °Time  °Name of Medication Number of pills taken    °Amount of Acetaminophen  °Pain Level  ° °Comments  °AM PM       °AM PM       °AM PM       °AM PM       ° ° ° °AM PM       °AM PM       °AM PM       °AM PM       °Total Daily amount of Acetaminophen °Do not take more than  3,000 mg per day    ° ° °Day 4   ° °Time  °Name of Medication Number of pills taken  °Amount of Acetaminophen  °Pain Level  ° °Comments  °AM PM       °AM PM       °AM PM       °AM PM       °AM PM       °AM PM       °AM PM       °AM PM       °Total Daily amount of Acetaminophen °Do not take more than  3,000 mg per day    ° ° °Day 5   ° °Time  °Name of Medication Number °of pills taken  °Amount of Acetaminophen  °Pain Level  ° °Comments  °AM PM       °AM PM       °AM PM       °AM PM       °AM PM       °AM PM       °AM PM       °AM PM       °Total Daily amount of Acetaminophen °Do not take more than  3,000 mg per day    ° ° ° °Day 6   ° °Time  °Name of Medication Number of pills °taken  °Amount of Acetaminophen  °Pain Level  °Comments  °AM PM       °AM PM       °AM PM       °AM PM       °AM PM       °AM PM       °AM PM       °AM PM       °Total Daily amount of Acetaminophen °Do not take more than    3,000 mg per day    ° ° °Day 7   ° °Time  °Name of Medication Number of pills taken  °Amount of Acetaminophen  °Pain Level  ° °Comments  °AM PM       °AM PM       °AM PM       °AM PM       °AM PM       °AM PM       °AM PM       °AM PM       °Total Daily amount of Acetaminophen °Do not take more than  3,000 mg per day    ° ° ° ° °For additional information about how and where to safely dispose of unused opioid °medications - https://www.[employer].org ° °Disclaimer: This document contains information and/or instructional materials adapted from [facility] for the typical patient with your condition. It does not replace medical advice from your health care provider because your experience may differ from that of the °typical patient. Talk to your health care provider if you have any questions about  this °document, your condition or your treatment plan. °Adapted from [facility] ° °

## 2018-06-08 NOTE — Progress Notes (Signed)
Discharge instructions reviewed with pt and daughter, Otila Kluver (whom will be staying with pt at discharge). Pt and daughter instructed on JP drain care and emptying and recording information on record form given, and also instructed to take this recording of drainage to surgeon's office when they go next week for follow up appt.  Copy of instructions,  JP drain handout, container for measuring output given to pt/daughter.  Daughter was able to demonstrate how to empty and recharge JP bulb and verbalized where to record output and take to MD office next week.     Pt eating lunch at this time, waiting for pt to pass gas as pt's daughter stated they were instructed she had to pass gas before going home today. Pt has walked in hall today, and has been sitting up in chair since breakfast approx 0830.

## 2018-06-08 NOTE — Progress Notes (Signed)
Pt ate lunch, tolerated well, has passed gas and ready to go home. Pt and family had no further questions. Pt d/c'd via wheelchair with belongings, with family, escorted by unit staff.

## 2018-06-24 ENCOUNTER — Telehealth: Payer: Self-pay

## 2018-06-24 NOTE — Telephone Encounter (Signed)
Notes on file, referral sent to scheduling.

## 2018-06-24 NOTE — Progress Notes (Signed)
[place]   [phone]) [phone] [phone]) [phone]   Clinic Follow up Note   Patient Care Team: Tsosie Billing, MD as PCP - General (Internal Medicine) Jerline Pain, MD as PCP - Cardiology (Cardiology) Edrick Oh, MD (Nephrology) Maia Breslow, MD (Orthopedic Surgery) Newton Pigg, MD as Consulting Physician (Obstetrics and Gynecology) Truitt Merle, MD as Consulting Physician (Hematology) Fanny Skates, MD as Consulting Physician (General Surgery)  Date of Service:  06/27/2018  CHIEF COMPLAINT: F/u of right breast cancer  SUMMARY OF ONCOLOGIC HISTORY: Oncology History   Cancer Staging Cancer of central portion of right female breast [facility]) Staging form: Breast, AJCC 8th Edition - Clinical stage from 03/30/2018: Stage IB (cT1c(m), cN1, cM0, G2, ER+, PR+, HER2-) - Signed by Truitt Merle, MD on 04/08/2018 - Pathologic stage from 06/07/2018: Stage IB (pT2(m), pN2a, cM0, G2, ER+, PR+, HER2-) - Signed by Gardenia Phlegm, NP on 06/22/2018       Cancer of central portion of right female breast ([place])   01/13/2018 Mammogram    Diagnostic Mammogram 01/13/18  RECOMMENDATION: 1. Ultrasound-guided biopsies of both masses in the RIGHT breast identified by ultrasound at the 12 o'clock axis (7 cm from the nipple measuring 1.1 x 0.8 x 0.7 cm and 5 cm from the nipple measuring 1 x 0.8 x 0.9 cm respectively). 2. Ultrasound-guided biopsy of the most prominent lymph node in the RIGHT axilla. 3. Postprocedure mammogram to ensure that the masses correspond to the extent of the asymmetry/distortion seen on mammogram. Additional stereotactic biopsy may be needed if the clips do not approximate the anterior and posterior extents. 4. Depending on the location of the post biopsy clips, and if breast conservation surgery is considered, stereotactic biopsy may be also needed for the coarse heterogeneous calcifications within the slightly outer RIGHT breast, measuring  8 mm extent, located 1.2 cm from the asymmetry/distortion.    03/30/2018 Cancer Staging    Staging form: Breast, AJCC 8th Edition - Clinical stage from 03/30/2018: Stage IB (cT1c(m), cN1, cM0, G2, ER+, PR+, HER2-) - Signed by Truitt Merle, MD on 04/08/2018    03/30/2018 Initial Biopsy    Diagnosis 03/30/18 1. Breast, right, needle core biopsy, 12 o'clock position, 5cm from nipple - INVASIVE LOBULAR CARCINOMA, GRADE 2. SEE NOTE. - LOBULAR CARCINOMA IN SITU, INTERMEDIATE NUCLEAR GRADE. 2. Breast, right, needle core biopsy, 12 o'clock position 7cfn - INVASIVE LOBULAR CARCINOMA, GRADE 2. SEE NOTE. 1 of 3 FINAL for Walls, [name] ([id number]) Diagnosis(continued) - LOBULAR CARCINOMA IN SITU, INTERMEDIATE NUCLEAR GRADE. 3. Lymph node, needle/core biopsy, right axilla - METASTATIC LOBULAR CARCINOMA TO LYMPH NODE.    03/30/2018 Receptors her2    Results: IMMUNOHISTOCHEMICAL AND MORPHOMETRIC ANALYSIS PERFORMED MANUALLY The tumor cells are NEGATIVE for Her2 (1+). Estrogen Receptor: 100%, POSITIVE, STRONG STAINING INTENSITY Progesterone Receptor: 70%, POSITIVE, STRONG STAINING INTENSITY Proliferation Marker Ki67: 10%    04/08/2018 Initial Diagnosis    Cancer of central portion of right female breast ([place])    04/27/2018 Imaging    CT CAP W Contrast 04/27/18  IMPRESSION: 1. Right axillary and right subpectoral lymphadenopathy, suspicious for metastatic disease. PET-CT may prove helpful to further evaluate. 2. Cholelithiasis. 3. Colonic diverticulosis without diverticulitis. 4.  Aortic Atherosclerois (ICD10-170.0)    04/28/2018 Imaging    Bone Scan 04/28/18  IMPRESSION: No scintigraphic evidence skeletal metastasis. Severe degenerative uptake in the medial compartment RIGHT knee.    06/07/2018 Cancer Staging    Staging form: Breast, AJCC 8th Edition - Pathologic stage from 06/07/2018:  Stage IB (pT2(m), pN2a, cM0, G2, ER+, PR+, HER2-) - Signed by Gardenia Phlegm, NP on 06/22/2018     06/07/2018 Surgery    RIGHT BREAST LUMPECTOMY WITH BRACKETED RADIOACTIVE SEEDS AND RIGHT COMPLETE AXILLARY LYMPH NODE DISSECTION by Dr. Dalbert Batman 06/07/18     06/07/2018 Pathology Results    Diagnosis 06/07/18 1. Breast, lumpectomy, right with radioactive seeds - INVASIVE LOBULAR CARCINOMA, MULTIFOCAL, LARGER FOCUS IS 3 CM, NOTTINGHAM GRADE 2 OF 3. - MARGINS OF RESECTION ARE NOT INVOLVED (CLOSEST MARGIN: LESS THAN 1 MM, ANTERIOR). - BIOPSY SITE CHANGES. - SEE ONCOLOGY TABLE. 2. Lymph nodes, regional resection, right axillary contents - METASTATIC CARCINOMA PRESENT IN SEVEN OF FOURTEEN LYMPH NODES (7/14). 3. Breast, excision, inferior margin - BREAST PARENCHYMA, NEGATIVE FOR CARCINOMA.      CURRENT THERAPY:  PENDING Adjuvant radiation   INTERVAL HISTORY:  Stacy Walls is here for a follow up post right breast lumpectomy. She presents to the clinic today with her family. She notes she is doing well after surgery. She still has soreness and pain of right arm. Takes her time moving her arm. She did follow up with Dr. Dalbert Batman after surgery. She is currently doing PT at home.  She notes she is currently staying with her 80 yo grandson. She does not drive herself.    REVIEW OF SYSTEMS:   Constitutional: Denies fevers, chills or abnormal weight loss Eyes: Denies blurriness of vision Ears, nose, mouth, throat, and face: Denies mucositis or sore throat Respiratory: Denies cough, dyspnea or wheezes Cardiovascular: Denies palpitation, chest discomfort or lower extremity swelling Gastrointestinal:  Denies nausea, heartburn or change in bowel habits Skin: Denies abnormal skin rashes MKS: (+) limited ROM of right shoulder  Lymphatics: Denies new lymphadenopathy or easy bruising Neurological:Denies numbness, tingling or new weaknesses Behavioral/Psych: Mood is stable, no new changes  Breast: (+) Right arm and breast soreness  All other systems were reviewed with the patient and are  negative.  MEDICAL HISTORY:  Past Medical History:  Diagnosis Date  . Acute cholecystitis   . Allergic rhinitis   . Anemia   . Arthritis   . Cancer [facility])    Right breast  . Chest pain   . Chronic renal insufficiency    followed by Dr Justin Mend stage 3  . Clostridium difficile colitis   . Constipation   . Coronary atherosclerosis of native coronary vessel   . Cough   . Depression    situational - husband died 2018-05-16  . Diabetes mellitus   . Dizziness   . DM2 (diabetes mellitus, type 2) ([place])   . Dyshidrosis   . Family history of adverse reaction to anesthesia    daughter - nausea  . GERD (gastroesophageal reflux disease)   . Gout   . HLD (hyperlipidemia)   . HTN (hypertension)   . Hypothyroidism   . Neck mass   . Neck pain   . Osteoporosis   . Routine general medical examination at a health care facility   . Secondary hyperparathyroidism (of renal origin)   . Urinary incontinence   . Urinary tract infection     SURGICAL HISTORY: Past Surgical History:  Procedure Laterality Date  . bone density  08/21/05  . BREAST LUMPECTOMY WITH RADIOACTIVE SEED AND AXILLARY LYMPH NODE DISSECTION Right 06/07/2018   Procedure: RIGHT BREAST LUMPECTOMY WITH BRACKETED RADIOACTIVE SEEDS AND RIGHT COMPLETE AXILLARY LYMPH NODE DISSECTION;  Surgeon: Fanny Skates, MD;  Location: [place];  Service: General;  Laterality: Right;  . C-section (  other)  1977  . CHOLECYSTECTOMY    . ELECTROCARDIOGRAM  02/01/06  . L ankle surgery Left 1988  . total left knee Left 2007    I have reviewed the social history and family history with the patient and they are unchanged from previous note.  ALLERGIES:  is allergic to ace inhibitors; azithromycin; pioglitazone; sulfonamide derivatives; and tramadol.  MEDICATIONS:  Current Outpatient Medications  Medication Sig Dispense Refill  . allopurinol (ZYLOPRIM) 300 MG tablet Take 1 tablet (300 mg total) by mouth daily. 90 tablet 3  . ALPRAZolam (XANAX) 0.25 MG  tablet Take 0.25 mg by mouth 3 (three) times daily as needed for anxiety.    Marland Kitchen aspirin EC 81 MG tablet Take 81 mg by mouth daily.    . diclofenac sodium (VOLTAREN) 1 % GEL APPLY 2 GRAMS EXTERNALLY TO THE AFFECTED AREA FOUR TIMES DAILY 100 g 0  . epoetin alfa (PROCRIT) 73710 UNIT/ML injection Inject 10,000 Units into the skin every 30 (thirty) days. Dr. Justin Mend    . escitalopram (LEXAPRO) 10 MG tablet Take 10 mg by mouth daily.    . Ferrous Sulfate (IRON) 325 (65 FE) MG TABS Take 325 mg by mouth daily.     . fluticasone (FLONASE) 50 MCG/ACT nasal spray Place 2 sprays into both nostrils daily as needed.     . furosemide (LASIX) 20 MG tablet Take 1 tablet (20 mg total) by mouth daily. 90 tablet 3  . HYDROcodone-acetaminophen (NORCO) 5-325 MG tablet Take 1-2 tablets by mouth every 6 (six) hours as needed for moderate pain or severe pain. 20 tablet 0  . hydrOXYzine (VISTARIL) 25 MG capsule Take 25 mg by mouth at bedtime as needed for anxiety.     Marland Kitchen levothyroxine (SYNTHROID, LEVOTHROID) 88 MCG tablet Take 1 tablet (88 mcg total) by mouth daily. 90 tablet 3  . losartan-hydrochlorothiazide (HYZAAR) 100-25 MG tablet Take 1 tablet by mouth daily. 90 tablet 3  . magnesium hydroxide (MILK OF MAGNESIA) 400 MG/5ML suspension Take 15 mLs by mouth daily as needed for mild constipation.    . Multiple Vitamins-Minerals (MULTIVITAMIN WOMEN 50+ PO) Take 1 tablet by mouth daily.     Marland Kitchen omeprazole (PRILOSEC) 20 MG capsule TAKE 1 CAPSULE(20 MG) BY MOUTH DAILY (Patient taking differently: Take 20 mg by mouth daily. ) 90 capsule 0  . oxybutynin (DITROPAN) 5 MG tablet 1 tab daily (Patient taking differently: Take 5 mg by mouth daily. ) 90 tablet 3  . Polyethyl Glycol-Propyl Glycol (SYSTANE) 0.4-0.3 % SOLN Place 1 drop into both eyes daily as needed (for dry eyes).    . sitaGLIPtin (JANUVIA) 100 MG tablet TAKE 1/2 TABLET(50 MG) BY MOUTH DAILY (Patient taking differently: Take 50 mg by mouth daily. ) 45 tablet 3  . triamcinolone  ointment (KENALOG) 0.1 % APPLY TOPICALLY TO THE AFFECTED AREA THREE TIMES DAILY AS NEEDED FOR ITCHING (Patient taking differently: Apply 1 application topically 3 (three) times daily as needed (for itching). ) 454 g 0  . vitamin C (ASCORBIC ACID) 500 MG tablet Take 500 mg by mouth 2 (two) times a week.      No current facility-administered medications for this visit.     PHYSICAL EXAMINATION: ECOG PERFORMANCE STATUS: 1 - Symptomatic but completely ambulatory  Vitals:   06/27/18 1153  BP: (!) 152/63  Pulse: 71  Resp: 20  Temp: 98.4 F (36.9 C)  SpO2: 99%   Filed Weights   06/27/18 1153  Weight: 240 lb 12.8 oz (109.2 kg)  GENERAL:alert, no distress and comfortable SKIN: skin color, texture, turgor are normal, no rashes or significant lesions EYES: normal, Conjunctiva are pink and non-injected, sclera clear OROPHARYNX:no exudate, no erythema and lips, buccal mucosa, and tongue normal  NECK: supple, thyroid normal size, non-tender, without nodularity LYMPH:  no palpable lymphadenopathy in the cervical, axillary or inguinal LUNGS: clear to auscultation and percussion with normal breathing effort HEART: regular rate & rhythm and no murmurs and no lower extremity edema ABDOMEN:abdomen soft, non-tender and normal bowel sounds Musculoskeletal:no cyanosis of digits and no clubbing (+) Limited ROM of right shoulder due to surgery NEURO: alert & oriented x 3 with fluent speech, no focal motor/sensory deficits BREAST: S/p right breast lumpectomy: Surgical incisions of breast and right axilla healing very well, tender, no discharge.    LABORATORY DATA:  I have reviewed the data as listed CBC Latest Ref Rng & Units 06/08/2018 06/07/2018 05/31/2018  WBC 4.0 - 10.5 K/uL 11.5(H) 10.9(H) 7.2  Hemoglobin 12.0 - 15.0 g/dL 10.1(L) 10.7(L) 10.7(L)  Hematocrit 36.0 - 46.0 % 31.1(L) 33.4(L) 34.7(L)  Platelets 150 - 400 K/uL 136(L) 136(L) 143(L)     CMP Latest Ref Rng & Units 06/08/2018 06/07/2018  05/31/2018  Glucose 70 - 99 mg/dL 182(H) - 136(H)  BUN 8 - 23 mg/dL 28(H) - 31(H)  Creatinine 0.44 - 1.00 mg/dL 1.67(H) 1.76(H) 1.86(H)  Sodium 135 - 145 mmol/L 134(L) - 136  Potassium 3.5 - 5.1 mmol/L 3.7 - 3.7  Chloride 98 - 111 mmol/L 100 - 100  CO2 22 - 32 mmol/L 26 - 26  Calcium 8.9 - 10.3 mg/dL 9.8 - 10.6(H)  Total Protein 6.5 - 8.1 g/dL - - 6.3(L)  Total Bilirubin 0.3 - 1.2 mg/dL - - 0.7  Alkaline Phos 38 - 126 U/L - - 75  AST 15 - 41 U/L - - 24  ALT 0 - 44 U/L - - 18      RADIOGRAPHIC STUDIES: I have personally reviewed the radiological images as listed and agreed with the findings in the report. No results found.   ASSESSMENT & PLAN:  Stacy Walls is a 85 y.o. female with   1. Cancer of central right breast, invasive carcinoma, stage IB, pT2c(m)N2aM0, ER+/PR+, HER2-, Grade II -She was recently diagnosed in 03/2018. She is underwent right breast lumpectomy and axillary lymph node dissection on 06/07/18.  -We discussed her pathology results in great detail with patient and her children. Her tumor was completely resected, negative margins, but she had 7/14 positive lymph nodes.  -Given her 7/14 positive LN she has high risk of recurrence. Standard treatment is adjuvant chemotherapy, but given her age, medical comorbidities, and lobular histology which is less sensitive to chemotherapy, I do not recommend chemotherapy.  Patient agrees with the plan -To reduce local recurrence I recommend adjuvant radiation. I will refer her to [place] for consultation to consider this.  -Giving the strong ER and PR expression in her postmenopausal status, I recommend adjuvant endocrine therapy with AI or tamoxifen for a total of 5-10 years to reduce the risk of distant cancer recurrence.  I think she can tolerate this well. Will proceed after radiation.   -She has history of osteoporosis, last bone density scan was in 2015.  I will repeat bone density scan before she starts adjuvant  antiestrogen therapy.  If she has severe osteoporosis, I may consider tamoxifen. -We also discussed the breast cancer surveillance after her surgery. She will continue annual screening mammogram, self exam, and a  routine office visit with lab and exam with [place] every 3-4 months for the 1-2 year then every 6-12 months.  -She is clinically doing well. Physical exam showed her surgical incisions are healing well and has limited ROM of right shoulder.  -Continue PT at home to help prevent Lymphedema and help ROM. I encourage her to use compression sleeve.  -F/u in 2 months   2. Iron deficient anemia and anemia of chronic disease secondary to CKD  -She was previously being treated with monthly EPO injections by Dr. Justin Mend which was stopped due to possible tumor growth.  -Stable anemia.  -On oral iron pill daily, continue  -She has not had IV iron before.    3. CKD, stage III, HTN, DM -On Lasix, Hyzaar, Omeprazole, Januvia -Managed by PCP, nephrologist and cardiologist.  -BP at 152/63 today (06/27/18)  4. Osteoporosis, OA  -Seen on DEXA from 03/2014 with lowest T-score of -2.6 in femoral neck.  -She ambulates with cane  -I recommend repeat DEXA before starting anti-estrogen therapy to get new baseline. If she remains osteoporotic may consider Tamoxifen over AI.  -Continue Vitamin D.    PLAN:  -Rad Onc Referral to discuss adjuvant radiation -Lab and f/u in 2 months, to start adjuvant aromatase inhibitor    No problem-specific Assessment & Plan notes found for this encounter.   Orders Placed This Encounter  Procedures  . DG Bone Density    Standing Status:   Future    Standing Expiration Date:   06/27/2019    Order Specific Question:   Reason for Exam (SYMPTOM  OR DIAGNOSIS REQUIRED)    Answer:   screening    Order Specific Question:   Preferred imaging location?    Answer:   [facility]  . Ambulatory referral to Radiation Oncology    Referral Priority:   Routine    Referral  Type:   Consultation    Referral Reason:   Specialty Services Required    Requested Specialty:   Radiation Oncology    Number of Visits Requested:   1   All questions were answered. The patient knows to call the clinic with any problems, questions or concerns. No barriers to learning was detected. I spent 20 minutes counseling the patient face to face. The total time spent in the appointment was 25 minutes and more than 50% was on counseling and review of test results     Truitt Merle, MD 06/27/2018   I, Joslyn Devon, am acting as scribe for Truitt Merle, MD.   I have reviewed the above documentation for accuracy and completeness, and I agree with the above.

## 2018-06-27 ENCOUNTER — Encounter: Payer: Self-pay | Admitting: Hematology

## 2018-06-27 ENCOUNTER — Inpatient Hospital Stay: Payer: Medicare Other | Attending: Hematology | Admitting: Hematology

## 2018-06-27 ENCOUNTER — Telehealth: Payer: Self-pay | Admitting: Hematology

## 2018-06-27 VITALS — BP 152/63 | HR 71 | Temp 98.4°F | Resp 20 | Ht 62.0 in | Wt 240.8 lb

## 2018-06-27 DIAGNOSIS — I129 Hypertensive chronic kidney disease with stage 1 through stage 4 chronic kidney disease, or unspecified chronic kidney disease: Secondary | ICD-10-CM | POA: Diagnosis not present

## 2018-06-27 DIAGNOSIS — M810 Age-related osteoporosis without current pathological fracture: Secondary | ICD-10-CM | POA: Diagnosis not present

## 2018-06-27 DIAGNOSIS — Z170 Estrogen receptor positive status [ER+]: Secondary | ICD-10-CM | POA: Diagnosis not present

## 2018-06-27 DIAGNOSIS — E1122 Type 2 diabetes mellitus with diabetic chronic kidney disease: Secondary | ICD-10-CM | POA: Diagnosis not present

## 2018-06-27 DIAGNOSIS — N183 Chronic kidney disease, stage 3 (moderate): Secondary | ICD-10-CM

## 2018-06-27 DIAGNOSIS — E2839 Other primary ovarian failure: Secondary | ICD-10-CM

## 2018-06-27 DIAGNOSIS — C50111 Malignant neoplasm of central portion of right female breast: Secondary | ICD-10-CM | POA: Diagnosis not present

## 2018-06-27 DIAGNOSIS — D631 Anemia in chronic kidney disease: Secondary | ICD-10-CM

## 2018-06-27 DIAGNOSIS — D509 Iron deficiency anemia, unspecified: Secondary | ICD-10-CM

## 2018-06-27 NOTE — Telephone Encounter (Signed)
Scheduled appt per 3/9 los. ° °Printed calendar and avs. °

## 2018-07-08 NOTE — Progress Notes (Signed)
Location of Breast Cancer: Right Breast  Histology per Pathology Report:  03/31/19 Diagnosis 1. Breast, right, needle core biopsy, 12 o'clock position, 5cm from nipple - INVASIVE LOBULAR CARCINOMA, GRADE 2. SEE NOTE. - LOBULAR CARCINOMA IN SITU, INTERMEDIATE NUCLEAR GRADE. 2. Breast, right, needle core biopsy, 12 o'clock position 7cfn - INVASIVE LOBULAR CARCINOMA, GRADE 2. SEE NOTE. - LOBULAR CARCINOMA IN SITU, INTERMEDIATE NUCLEAR GRADE. 3. Lymph node, needle/core biopsy, right axilla - METASTATIC LOBULAR CARCINOMA TO LYMPH NODE.  Receptor Status: ER(100%), PR (70%), Her2-neu (NEG), Ki-(10%)  04/25/18 Diagnosis Breast, right, needle core biopsy, 1 o'clock, heart clip - INVASIVE AND IN SITU MAMMARY CARCINOMA.  Receptor status: ER(90%), PR(90%), Her2-neu (NEG), Ki- (10%)  06/07/18 Diagnosis 1. Breast, lumpectomy, right with radioactive seeds - INVASIVE LOBULAR CARCINOMA, MULTIFOCAL, LARGER FOCUS IS 3 CM, NOTTINGHAM GRADE 2 OF 3. - MARGINS OF RESECTION ARE NOT INVOLVED (CLOSEST MARGIN: LESS THAN 1 MM, ANTERIOR). - BIOPSY SITE CHANGES. - SEE ONCOLOGY TABLE. 2. Lymph nodes, regional resection, right axillary contents - METASTATIC CARCINOMA PRESENT IN SEVEN OF FOURTEEN LYMPH NODES (7/14). 3. Breast, excision, inferior margin - BREAST PARENCHYMA, NEGATIVE FOR CARCINOMA.  Did patient present with symptoms or was this found on screening mammography?: She felt a knot herself and she presented to her physician.   Past/Anticipated interventions by surgeon, if any: 06/07/18 Procedure:                 Right breast quadrantectomy with bracketed radioactive seed localization,                                      complete right axillary lymph node dissection,                                      Reexcision inferior margin Surgeon:                     Stacy Walls. Stacy Walls, M.D., [facility]  Past/Anticipated interventions by medical oncology, if any:  Stacy Walls 06/27/18 PLAN:  -Rad Onc Referral to  discuss adjuvant radiation -Lab and f/u in 2 months, to start adjuvant aromatase inhibitor  Lymphedema issues, if any:  She reports swelling to her upper arm and breast area.   Pain issues, if any:  She reports soreness at her surgery site.   SAFETY ISSUES:  Prior radiation? No  Pacemaker/ICD? No  Possible current pregnancy? No  Is the patient on methotrexate? No  Current Complaints / other details:        [name], [name], RN 07/08/2018,3:11 PM

## 2018-07-12 ENCOUNTER — Telehealth: Payer: Self-pay

## 2018-07-12 NOTE — Telephone Encounter (Addendum)
I spoke with Ms. Salvetti regarding her scheduled consult with Dr. Isidore Moos tomorrow 07/13/18. Dr. Isidore Moos would like to have a telephone consult with Ms. Lapiana due to COVID-19 virus and to minimize Ms. Quintanilla's risk by completing the consult on the telephone. Ms. Totman has agreed to the telephone consult. She knows to expect a phone call at 3:00 or shortly after from Dr. Isidore Moos. She knows to call me if she has any further questions or concerns before that time.

## 2018-07-13 ENCOUNTER — Other Ambulatory Visit: Payer: Self-pay

## 2018-07-13 ENCOUNTER — Encounter: Payer: Self-pay | Admitting: Radiation Oncology

## 2018-07-13 ENCOUNTER — Ambulatory Visit
Admission: RE | Admit: 2018-07-13 | Discharge: 2018-07-13 | Disposition: A | Discharge status: Home / Self Care | Payer: Medicare Other | Source: Ambulatory Visit | Attending: Radiation Oncology | Admitting: Radiation Oncology

## 2018-07-13 DIAGNOSIS — Z170 Estrogen receptor positive status [ER+]: Principal | ICD-10-CM

## 2018-07-13 DIAGNOSIS — C50111 Malignant neoplasm of central portion of right female breast: Secondary | ICD-10-CM

## 2018-07-13 NOTE — Progress Notes (Signed)
Radiation Oncology         ([phone]) [phone] ________________________________  Initial Telemedicine Consultation  Name: Stacy Walls MRN: [id number]  Date: 07/13/2018  DOB: [birth year]  DG:[id number], Hardie Pulley, MD  Truitt Merle, MD   REFERRING PHYSICIAN: Truitt Merle, MD  DIAGNOSIS:    ICD-10-CM   1. Malignant neoplasm of central portion of right breast in female, estrogen receptor positive ([place]) C50.111    Z17.0   Cancer Staging Cancer of central portion of right female breast [facility]) Staging form: Breast, AJCC 8th Edition - Clinical stage from 03/30/2018: Stage IB (cT1c(m), cN1, cM0, G2, ER+, PR+, HER2-) - Signed by Truitt Merle, MD on 04/08/2018 - Pathologic stage from 06/07/2018: Stage IB (pT2(m), pN2a, cM0, G2, ER+, PR+, HER2-) - Signed by Gardenia Phlegm, NP on 06/22/2018    CHIEF COMPLAINT:  breast cancer  HISTORY OF PRESENT ILLNESS::Stacy Walls is a 85 y.o. female  felt a knot herself and she presented to her physician.   On 03/31/19 biopsies showed Diagnosis 1. Breast, right, needle core biopsy, 12 o'clock position, 5cm from nipple - INVASIVE LOBULAR CARCINOMA, GRADE 2. SEE NOTE. - LOBULAR CARCINOMA IN SITU, INTERMEDIATE NUCLEAR GRADE. 2. Breast, right, needle core biopsy, 12 o'clock position 7cfn - INVASIVE LOBULAR CARCINOMA, GRADE 2. SEE NOTE. - LOBULAR CARCINOMA IN SITU, INTERMEDIATE NUCLEAR GRADE. 3. Lymph node, needle/core biopsy, right axilla - METASTATIC LOBULAR CARCINOMA TO LYMPH NODE.  Receptor Status: ER(100%), PR (70%), Her2-neu (NEG), Ki-(10%)  MR of breasts 04-14-18 showed: At least 4 enlarged lymph nodes within the RIGHT axilla, largest containing a biopsy clip corresponding to the biopsy-proven axillary metastasis, and including 1 enlarged/morphologically abnormal subpectoral lymph node (series 8, image 82).  No enlarged or morphologically abnormal lymph nodes identified within the LEFT axilla or within the bilateral internal mammary  chain regions. There is a combination of mass and non-mass enhancement within the upper RIGHT breast.  04/25/18 Diagnosis Breast, right, needle core biopsy, 1 o'clock, heart clip - INVASIVE AND IN SITU MAMMARY CARCINOMA.  Receptor status: ER(90%), PR(90%), Her2-neu (NEG), Ki- (10%)  Staging scans (Bone scan, CT CAP) negative for distant mets. I've looked at her images.   06/07/18 Diagnosis 1. Breast, lumpectomy, right with radioactive seeds - INVASIVE LOBULAR CARCINOMA, MULTIFOCAL, LARGER FOCUS IS 3 CM, NOTTINGHAM GRADE 2 OF 3. - MARGINS OF RESECTION ARE NOT INVOLVED (CLOSEST MARGIN: LESS THAN 1 MM, ANTERIOR). - BIOPSY SITE CHANGES. - SEE ONCOLOGY TABLE. 2. Lymph nodes, regional resection, right axillary contents - METASTATIC CARCINOMA PRESENT IN SEVEN OF FOURTEEN LYMPH NODES (7/14). 3. Breast, excision, inferior margin - BREAST PARENCHYMA, NEGATIVE FOR CARCINOMA.    Edsel Petrin. Dalbert Batman, M.D., FACS was her surgeon  Past/Anticipated interventions by medical oncology, if any:  Dr. Burr Medico 06/27/18 - did not recommend chemotherapy given the risks of treatment. PLAN:  -Rad Onc Referral to discuss adjuvant radiation -Lab and f/u in 2 months, to start adjuvant aromatase inhibitor  Lymphedema issues, if any:  She reports swelling to her upper arm and breast area.   Pain issues, if any:  She reports soreness at her surgery site.   SAFETY ISSUES:  Prior radiation? No  Pacemaker/ICD? No  Possible current pregnancy? No  Is the patient on methotrexate? No  Current Complaints / other details:  her right breast and arm sore post operatively.  No drains in place any more.  Constipation is noted, chronic. + right tricep numbness  PREVIOUS RADIATION THERAPY: No  PAST MEDICAL HISTORY:  has a past  medical history of Acute cholecystitis, Allergic rhinitis, Anemia, Arthritis, Cancer ([place]), Chest pain, Chronic renal insufficiency, Clostridium difficile colitis, Constipation, Coronary  atherosclerosis of native coronary vessel, Cough, Depression, Diabetes mellitus, Dizziness, DM2 (diabetes mellitus, type 2) ([place]), Dyshidrosis, Family history of adverse reaction to anesthesia, GERD (gastroesophageal reflux disease), Gout, HLD (hyperlipidemia), HTN (hypertension), Hypothyroidism, Neck mass, Neck pain, Osteoporosis, Routine general medical examination at a health care facility, Secondary hyperparathyroidism (of renal origin), Urinary incontinence, and Urinary tract infection.    PAST SURGICAL HISTORY: Past Surgical History:  Procedure Laterality Date   bone density  08/21/05   BREAST LUMPECTOMY WITH RADIOACTIVE SEED AND AXILLARY LYMPH NODE DISSECTION Right 06/07/2018   Procedure: RIGHT BREAST LUMPECTOMY WITH BRACKETED RADIOACTIVE SEEDS AND RIGHT COMPLETE AXILLARY LYMPH NODE DISSECTION;  Surgeon: Fanny Skates, MD;  Location: [place];  Service: General;  Laterality: Right;   C-section (other)  1977   CHOLECYSTECTOMY     ELECTROCARDIOGRAM  02/01/06   L ankle surgery Left 1988   total left knee Left 2007    FAMILY HISTORY: family history includes Cancer in her brother; Coronary artery disease in her father; Diabetes in her brother, father, and sister; Stroke in her father and sister.  SOCIAL HISTORY:  reports that she has never smoked. She has never used smokeless tobacco. She reports that she does not drink alcohol or use drugs.  ALLERGIES: Ace inhibitors; Azithromycin; Pioglitazone; Sulfonamide derivatives; and Tramadol  MEDICATIONS:  Current Outpatient Medications  Medication Sig Dispense Refill   allopurinol (ZYLOPRIM) 300 MG tablet Take 1 tablet (300 mg total) by mouth daily. 90 tablet 3   ALPRAZolam (XANAX) 0.25 MG tablet Take 0.25 mg by mouth 3 (three) times daily as needed for anxiety.     aspirin EC 81 MG tablet Take 81 mg by mouth daily.     diclofenac sodium (VOLTAREN) 1 % GEL APPLY 2 GRAMS EXTERNALLY TO THE AFFECTED AREA FOUR TIMES DAILY 100 g 0    escitalopram (LEXAPRO) 10 MG tablet Take 10 mg by mouth daily. Patient taking it as needed when depressed.     Ferrous Sulfate (IRON) 325 (65 FE) MG TABS Take 325 mg by mouth daily.      fluticasone (FLONASE) 50 MCG/ACT nasal spray Place 2 sprays into both nostrils daily as needed.      furosemide (LASIX) 20 MG tablet Take 1 tablet (20 mg total) by mouth daily. 90 tablet 3   HYDROcodone-acetaminophen (NORCO) 5-325 MG tablet Take 1-2 tablets by mouth every 6 (six) hours as needed for moderate pain or severe pain. 20 tablet 0   hydrOXYzine (VISTARIL) 25 MG capsule Take 25 mg by mouth at bedtime as needed for anxiety.      levothyroxine (SYNTHROID, LEVOTHROID) 88 MCG tablet Take 1 tablet (88 mcg total) by mouth daily. 90 tablet 3   losartan-hydrochlorothiazide (HYZAAR) 100-25 MG tablet Take 1 tablet by mouth daily. 90 tablet 3   magnesium hydroxide (MILK OF MAGNESIA) 400 MG/5ML suspension Take 15 mLs by mouth daily as needed for mild constipation.     Multiple Vitamins-Minerals (MULTIVITAMIN WOMEN 50+ PO) Take 1 tablet by mouth daily.      oxybutynin (DITROPAN) 5 MG tablet 1 tab daily (Patient taking differently: Take 5 mg by mouth daily. ) 90 tablet 3   Polyethyl Glycol-Propyl Glycol (SYSTANE) 0.4-0.3 % SOLN Place 1 drop into both eyes daily as needed (for dry eyes).     sitaGLIPtin (JANUVIA) 100 MG tablet TAKE 1/2 TABLET(50 MG) BY MOUTH DAILY (Patient taking  differently: Take 50 mg by mouth daily. ) 45 tablet 3   triamcinolone ointment (KENALOG) 0.1 % APPLY TOPICALLY TO THE AFFECTED AREA THREE TIMES DAILY AS NEEDED FOR ITCHING (Patient taking differently: Apply 1 application topically 3 (three) times daily as needed (for itching). ) 454 g 0   vitamin C (ASCORBIC ACID) 500 MG tablet Take 500 mg by mouth 2 (two) times a week.      epoetin alfa (PROCRIT) 09811 UNIT/ML injection Inject 10,000 Units into the skin every 30 (thirty) days. Dr. Justin Mend     omeprazole (PRILOSEC) 20 MG capsule TAKE  1 CAPSULE(20 MG) BY MOUTH DAILY (Patient not taking: No sig reported) 90 capsule 0   No current facility-administered medications for this encounter.     REVIEW OF SYSTEMS: A 10+ POINT REVIEW OF SYSTEMS WAS OBTAINED including neurology, dermatology, psychiatry, cardiac, respiratory, lymph, extremities, GI, GU, Musculoskeletal, constitutional, breasts, reproductive, HEENT.  All pertinent positives are noted in the HPI.  All others are negative.   PHYSICAL EXAM:  vitals were not taken for this visit.    Gen: NAD Breasts :Right breast lumpectomy/axillary scar show good healing    LABORATORY DATA:  Lab Results  Component Value Date   WBC 11.5 (H) 06/08/2018   HGB 10.1 (L) 06/08/2018   HCT 31.1 (L) 06/08/2018   MCV 96.0 06/08/2018   PLT 136 (L) 06/08/2018   CMP     Component Value Date/Time   NA 134 (L) 06/08/2018 0214   K 3.7 06/08/2018 0214   CL 100 06/08/2018 0214   CO2 26 06/08/2018 0214   GLUCOSE 182 (H) 06/08/2018 0214   BUN 28 (H) 06/08/2018 0214   CREATININE 1.67 (H) 06/08/2018 0214   CREATININE 1.56 (H) 04/08/2018 1547   CALCIUM 9.8 06/08/2018 0214   CALCIUM 10.5 01/12/2011 1116   PROT 6.3 (L) 05/31/2018 1106   ALBUMIN 3.3 (L) 05/31/2018 1106   AST 24 05/31/2018 1106   AST 20 04/08/2018 1547   ALT 18 05/31/2018 1106   ALT 15 04/08/2018 1547   ALKPHOS 75 05/31/2018 1106   BILITOT 0.7 05/31/2018 1106   BILITOT 0.7 04/08/2018 1547   GFRNONAA 28 (L) 06/08/2018 0214   GFRNONAA 30 (L) 04/08/2018 1547   GFRAA 32 (L) 06/08/2018 0214   GFRAA 35 (L) 04/08/2018 1547        RADIOGRAPHY: as above, I've viewed her images     IMPRESSION/PLAN: locally advanced right breast cancer  It was a pleasure meeting the patient and her family by WebEx today. We discussed the risks, benefits, and side effects of radiotherapy. I recommend radiotherapy to the right breast and regional nodes to reduce her risk of locoregional recurrence by 2/3.  We discussed that radiation would take  approximately 4 weeks to complete. We spoke about acute effects including skin irritation and fatigue as well as much less common late effects including internal organ injury or irritation. We spoke about the latest technology that is used to minimize the risk of late effects for patients undergoing radiotherapy to the breast or chest wall. No guarantees of treatment were given. The patient is enthusiastic about proceeding with treatment. I look forward to participating in the patient's care. Will schedule simulation ASAP.  We discussed measures to reduce the risk of infection during the COVID-19 pandemic.  We discussed the option of a standard 6 wk course vs hypo fractionated 4 wk course.  To complete her treatment ASAP during this pandemic and minimize trips outside the home in  this time of social isolation, I think a 4 wk course is best for her. She and her family agreed to this during our consult.   This encounter was provided by telemedicine platform Webex.  The patient has given verbal consent for this type of encounter and has been advised to only accept a meeting of this type in a secure network environment. The time spent during this encounter was over 20 minutes. The attendants for this meeting include Eppie Gibson  and [name].  During the encounter, Eppie Gibson was located at [facility] Radiation Oncology Department.  Stacy Walls was located at home.    __________________________________________   Eppie Gibson, MD

## 2018-07-18 ENCOUNTER — Encounter: Payer: Self-pay | Admitting: Radiation Oncology

## 2018-07-19 NOTE — Addendum Note (Signed)
Encounter addended by: Lydiah Pong, [name], RN on: 07/19/2018 3:39 PM  Actions taken: Charge Capture section accepted

## 2018-07-20 ENCOUNTER — Telehealth: Payer: Self-pay | Admitting: Hematology

## 2018-07-20 ENCOUNTER — Ambulatory Visit: Payer: Medicare Other | Admitting: Radiation Oncology

## 2018-07-20 ENCOUNTER — Encounter: Payer: Self-pay | Admitting: Radiation Oncology

## 2018-07-20 ENCOUNTER — Other Ambulatory Visit: Payer: Self-pay | Admitting: Hematology

## 2018-07-20 MED ORDER — TAMOXIFEN CITRATE 20 MG PO TABS: 20.0000 mg | ORAL_TABLET | Freq: Every day | ORAL | 3 refills | Status: DC

## 2018-07-20 NOTE — Progress Notes (Signed)
I spoke with Ms. Mulhall this AM and let her know that I now have heightened concerns about her coming to [facility] this month for RT, given that the pandemic is predicted to peak in [place] at the end of April.  I told her that I spoke with Dr Burr Medico and we agree it is in her best interest to start antiestrogens now and hold RT planning for the middle of the summer.  Ms Dingledine agrees and said she was thinking the same thing.  Dr Burr Medico will call her soon to discuss an antiestrogen Rx. My staff will arrange f/u and simulation with me in the middle of the summer.  I wished her the best. She was very grateful for the call. -----------------------------------  Eppie Gibson, MD

## 2018-07-20 NOTE — Progress Notes (Addendum)
I spoke with Ms. Behney this AM and let her know that I now have heightened concerns about her coming to [facility] this month for RT, given that the pandemic is predicted to peak in [place] at the end of April.  I told her that I spoke with Dr Burr Medico and we agree it is in her best interest to start antiestrogens now and hold RT planning for the middle of the summer.  Ms Staub agrees and said she was thinking the same thing.  Dr Burr Medico will call her soon to discuss an antiestrogen Rx. My staff will arrange f/u and simulation with me in the middle of the summer.  I wished her the best. She was very grateful for the call.  -----------------------------------  Eppie Gibson, MD

## 2018-07-20 NOTE — Telephone Encounter (Signed)
Dr. Isidore Moos has decided to postpone her adjuvant radiation due to the current COVID-19 outbreak.  I spoke with patient, and recommend her to start antiestrogen therapy now while she is waiting for radiation.  Due to her history of osteoporosis, I recommend her to consider tamoxifen 20 mg once daily.  Potential side effects, especially increased risk of thrombosis, endometrial cancer, hot flash, metabolic change, etc. were discussed with her in details.  She agreed to proceed.  I called into her pharmacy, she will start this week or next week.  I will see her back in a few months for follow up.  Stacy Walls  07/20/2018

## 2018-07-21 ENCOUNTER — Telehealth: Payer: Self-pay | Admitting: *Deleted

## 2018-07-21 NOTE — Telephone Encounter (Signed)
Called patient to inform of [facility] appt. and sim for 10-12-18, lvm for a return call.

## 2018-07-25 ENCOUNTER — Ambulatory Visit: Payer: Medicare Other | Admitting: Radiation Oncology

## 2018-07-26 ENCOUNTER — Ambulatory Visit: Payer: Medicare Other

## 2018-07-27 ENCOUNTER — Ambulatory Visit: Payer: Medicare Other | Admitting: Radiation Oncology

## 2018-07-27 ENCOUNTER — Ambulatory Visit: Payer: Medicare Other

## 2018-07-28 ENCOUNTER — Ambulatory Visit: Payer: Medicare Other

## 2018-07-28 ENCOUNTER — Telehealth: Payer: Self-pay

## 2018-07-28 NOTE — Telephone Encounter (Signed)
Patient requested to cancel her diabetes appt with Dr. Loanne Drilling on 08/02/18. States her diabetes is being managed by Dr. Marvel Plan and is no longer needing Dr. Loanne Drilling to manage. Message sent Dr. Loanne Drilling to make him aware

## 2018-07-29 ENCOUNTER — Ambulatory Visit: Payer: Medicare Other

## 2018-08-01 ENCOUNTER — Ambulatory Visit: Payer: Medicare Other

## 2018-08-02 ENCOUNTER — Ambulatory Visit: Payer: Medicare Other

## 2018-08-02 ENCOUNTER — Ambulatory Visit: Payer: Medicare Other | Admitting: Endocrinology

## 2018-08-03 ENCOUNTER — Ambulatory Visit: Payer: Medicare Other

## 2018-08-04 ENCOUNTER — Ambulatory Visit: Payer: Medicare Other

## 2018-08-05 ENCOUNTER — Ambulatory Visit: Payer: Medicare Other

## 2018-08-08 ENCOUNTER — Ambulatory Visit: Payer: Medicare Other

## 2018-08-09 ENCOUNTER — Ambulatory Visit: Payer: Medicare Other

## 2018-08-09 ENCOUNTER — Telehealth: Payer: Self-pay | Admitting: Hematology

## 2018-08-10 ENCOUNTER — Telehealth: Payer: Self-pay | Admitting: Hematology

## 2018-08-10 ENCOUNTER — Telehealth: Payer: Self-pay

## 2018-08-10 ENCOUNTER — Ambulatory Visit: Payer: Medicare Other

## 2018-08-10 NOTE — Telephone Encounter (Signed)
R/s appt per 4/21 sch message - unable to reach patient . Unable to leave message - sent reminder letter in the mail.

## 2018-08-10 NOTE — Telephone Encounter (Signed)
Spoke with patient per Dr. Burr Medico to follow up on how she is tolerating Tamoxifen.  The patient feels like she is doing well, no adverse side effects.  Explained we will move her scheduled appointment for 5/8 to 6/24 either before or after her RT appointments, she understands someone from scheduling will call her.  A scheduling message was sent.

## 2018-08-11 ENCOUNTER — Ambulatory Visit: Payer: Medicare Other

## 2018-08-12 ENCOUNTER — Ambulatory Visit: Payer: Medicare Other

## 2018-08-15 ENCOUNTER — Ambulatory Visit: Payer: Medicare Other

## 2018-08-16 ENCOUNTER — Ambulatory Visit: Payer: Medicare Other

## 2018-08-17 ENCOUNTER — Ambulatory Visit: Payer: Medicare Other

## 2018-08-18 ENCOUNTER — Ambulatory Visit: Payer: Medicare Other

## 2018-08-19 ENCOUNTER — Ambulatory Visit: Payer: Medicare Other

## 2018-08-26 ENCOUNTER — Other Ambulatory Visit: Payer: Medicare Other

## 2018-08-26 ENCOUNTER — Ambulatory Visit: Payer: Medicare Other | Admitting: Hematology

## 2018-09-06 ENCOUNTER — Other Ambulatory Visit: Payer: Medicare Other

## 2018-09-30 NOTE — Progress Notes (Signed)
Location of Breast Cancer: Right Breast  Histology per Pathology Report:  03/31/19 Diagnosis 1. Breast, right, needle core biopsy, 12 o'clock position, 5cm from nipple - INVASIVE LOBULAR CARCINOMA, GRADE 2. SEE NOTE. - LOBULAR CARCINOMA IN SITU, INTERMEDIATE NUCLEAR GRADE. 2. Breast, right, needle core biopsy, 12 o'clock position 7cfn - INVASIVE LOBULAR CARCINOMA, GRADE 2. SEE NOTE. - LOBULAR CARCINOMA IN SITU, INTERMEDIATE NUCLEAR GRADE. 3. Lymph node, needle/core biopsy, right axilla - METASTATIC LOBULAR CARCINOMA TO LYMPH NODE.  Receptor Status: ER(100%), PR (70%), Her2-neu (NEG), Ki-(10%)  04/25/18 Diagnosis Breast, right, needle core biopsy, 1 o'clock, heart clip - INVASIVE AND IN SITU MAMMARY CARCINOMA.  Receptor status: ER(90%), PR(90%), Her2-neu (NEG), Ki- (10%)  06/07/18 Diagnosis 1. Breast, lumpectomy, right with radioactive seeds - INVASIVE LOBULAR CARCINOMA, MULTIFOCAL, LARGER FOCUS IS 3 CM, NOTTINGHAM GRADE 2 OF 3. - MARGINS OF RESECTION ARE NOT INVOLVED (CLOSEST MARGIN: LESS THAN 1 MM, ANTERIOR). - BIOPSY SITE CHANGES. - SEE ONCOLOGY TABLE. 2. Lymph nodes, regional resection, right axillary contents - METASTATIC CARCINOMA PRESENT IN SEVEN OF FOURTEEN LYMPH NODES (7/14). 3. Breast, excision, inferior margin - BREAST PARENCHYMA, NEGATIVE FOR CARCINOMA.  Did patient present with symptoms or was this found on screening mammography?: She felt a knot herself and she presented to her physician.   Past/Anticipated interventions by surgeon, if any: 06/07/18 Procedure:Right breast quadrantectomy with bracketed radioactive seed localization, complete right axillary lymph node dissection, Reexcision inferior margin Surgeon:Haywood M. Dalbert Batman, M.D., [facility]  Past/Anticipated interventions by medical oncology, if any:  Dr. Burr Medico 06/27/18 PLAN: -Rad Onc  Referralto discuss adjuvant radiation -Lab and f/u in 2 months,to start adjuvant aromatase inhibitor  ++Patient was started on Tamoxifen by Dr. Burr Medico on 07/20/18.  Lymphedema issues, if any:  She denies. She has good arm mobility  Pain issues, if any:  She denies.   SAFETY ISSUES:  Prior radiation? No  Pacemaker/ICD? No  Possible current pregnancy? No  Is the patient on methotrexate? No  Current Complaints / other details:    BP 102/88 (BP Location: Left Wrist, Patient Position: Sitting)   Pulse 63   Temp 98.3 F (36.8 C) (Temporal)   Resp 18   Ht _0  (1.575 m)   SpO2 100%   BMI 44.04 kg/m    Wt Readings from Last 3 Encounters:  06/27/18 240 lb 12.8 oz (109.2 kg)  05/31/18 237 lb 9.6 oz (107.8 kg)  04/12/18 242 lb 1.9 oz (109.8 kg)

## 2018-10-05 ENCOUNTER — Ambulatory Visit
Admission: RE | Admit: 2018-10-05 | Discharge: 2018-10-05 | Disposition: A | Discharge status: Home / Self Care | Payer: Medicare Other | Source: Ambulatory Visit | Attending: Radiation Oncology | Admitting: Radiation Oncology

## 2018-10-05 ENCOUNTER — Encounter: Payer: Self-pay | Admitting: Radiation Oncology

## 2018-10-05 ENCOUNTER — Other Ambulatory Visit: Payer: Self-pay

## 2018-10-05 VITALS — BP 102/88 | HR 63 | Temp 98.3°F | Resp 18 | Ht 62.0 in

## 2018-10-05 DIAGNOSIS — Z170 Estrogen receptor positive status [ER+]: Secondary | ICD-10-CM | POA: Insufficient documentation

## 2018-10-05 DIAGNOSIS — Z510 Encounter for antineoplastic radiation therapy: Secondary | ICD-10-CM | POA: Insufficient documentation

## 2018-10-05 DIAGNOSIS — C50111 Malignant neoplasm of central portion of right female breast: Secondary | ICD-10-CM

## 2018-10-05 DIAGNOSIS — C50011 Malignant neoplasm of nipple and areola, right female breast: Secondary | ICD-10-CM | POA: Insufficient documentation

## 2018-10-05 DIAGNOSIS — C50811 Malignant neoplasm of overlapping sites of right female breast: Secondary | ICD-10-CM

## 2018-10-05 NOTE — Patient Instructions (Signed)
Coronavirus (COVID-19) Are you at risk?  Are you at risk for the Coronavirus (COVID-19)?  To be considered HIGH RISK for Coronavirus (COVID-19), you have to meet the following criteria:  . Traveled to [place], [place], [place], [place] or [place]; or in the [place] to [place], [place], [place], or [place]; and have fever, cough, and shortness of breath within the last 2 weeks of travel OR . Been in close contact with a person diagnosed with COVID-19 within the last 2 weeks and have fever, cough, and shortness of breath . IF YOU DO NOT MEET THESE CRITERIA, YOU ARE CONSIDERED LOW RISK FOR COVID-19.  What to do if you are HIGH RISK for COVID-19?  . If you are having a medical emergency, call 911. . Seek medical care right away. Before you go to a doctor's office, urgent care or emergency department, call ahead and tell them about your recent travel, contact with someone diagnosed with COVID-19, and your symptoms. You should receive instructions from your physician's office regarding next steps of care.  . When you arrive at healthcare provider, tell the healthcare staff immediately you have returned from visiting [place], [place], [place], [place] or [place]; or traveled in the [place] to [place], [place], [place], or [place]; in the last two weeks or you have been in close contact with a person diagnosed with COVID-19 in the last 2 weeks.   . Tell the health care staff about your symptoms: fever, cough and shortness of breath. . After you have been seen by a medical provider, you will be either: o Tested for (COVID-19) and discharged home on quarantine except to seek medical care if symptoms worsen, and asked to  - Stay home and avoid contact with others until you get your results (4-5 days)  - Avoid travel on public transportation if possible (such as bus, train, or airplane) or o Sent to the Emergency Department by EMS for evaluation, COVID-19 testing, and possible  admission depending on your condition and test results.  What to do if you are LOW RISK for COVID-19?  Reduce your risk of any infection by using the same precautions used for avoiding the common cold or flu:  . Wash your hands often with soap and warm water for at least 20 seconds.  If soap and water are not readily available, use an alcohol-based hand sanitizer with at least 60% alcohol.  . If coughing or sneezing, cover your mouth and nose by coughing or sneezing into the elbow areas of your shirt or coat, into a tissue or into your sleeve (not your hands). . Avoid shaking hands with others and consider head nods or verbal greetings only. . Avoid touching your eyes, nose, or mouth with unwashed hands.  . Avoid close contact with people who are sick. . Avoid places or events with large numbers of people in one location, like concerts or sporting events. . Carefully consider travel plans you have or are making. . If you are planning any travel outside or inside the [place], visit the CDC's Travelers' Health webpage for the latest health notices. . If you have some symptoms but not all symptoms, continue to monitor at home and seek medical attention if your symptoms worsen. . If you are having a medical emergency, call 911.   ADDITIONAL HEALTHCARE OPTIONS FOR PATIENTS  [facility] [facility] / e-Visit: [email or web address]         [facility]: [phone]  [facility]: [phone]                   [facility]: [phone]   

## 2018-10-05 NOTE — Progress Notes (Signed)
  Radiation Oncology         [phone]) [id number] ________________________________  Name: Stacy Walls MRN: [id number]  Date: 10/05/2018  DOB: [birth year]  SIMULATION AND TREATMENT PLANNING NOTE    Outpatient  DIAGNOSIS:    ICD-10-CM  Malignant neoplasm of central portion of right breast in female, estrogen receptor positive ([place]) C50.111  Z17.0    NARRATIVE:  The patient was brought to the [place].  Identity was confirmed.  All relevant records and images related to the planned course of therapy were reviewed.  The patient freely provided informed written consent to proceed with treatment after reviewing the details related to the planned course of therapy. The consent form was witnessed and verified by the simulation staff.    Then, the patient was set-up in a stable reproducible supine position for radiation therapy with her ipsilateral arm over her head, and her upper body secured in a custom-made Vac-lok device.  CT images were obtained.  Surface markings were placed.  The CT images were loaded into the planning software.    TREATMENT PLANNING NOTE: Treatment planning then occurred.  The radiation prescription was entered and confirmed.     A total of 5 medically necessary complex treatment devices were fabricated and supervised by me: 4 fields with MLCs for custom blocks to protect heart, and lungs;  and, a Vac-lok. MORE COMPLEX DEVICES MAY BE MADE IN DOSIMETRY FOR FIELD IN FIELD BEAMS FOR DOSE HOMOGENEITY.  I have requested : 3D Simulation which is medically necessary to give adequate dose to at risk tissues while sparing lungs and heart.  I have requested a DVH of the following structures: lungs, heart, lumpectomy cavity.    The patient will receive 50 Gy in 25 fractions to the right breast and regional nodes with 4 fields. This will be followed by a boost.  Optical Surface Tracking Plan:  Since intensity modulated radiotherapy (IMRT) and 3D conformal  radiation treatment methods are predicated on accurate and precise positioning for treatment, intrafraction motion monitoring is medically necessary to ensure accurate and safe treatment delivery. The ability to quantify intrafraction motion without excessive ionizing radiation dose can only be performed with optical surface tracking. Accordingly, surface imaging offers the opportunity to obtain 3D measurements of patient position throughout IMRT and 3D treatments without excessive radiation exposure. I am ordering optical surface tracking for this patient's upcoming course of radiotherapy.  ________________________________   Reference:  Ursula Alert, J, et al. Surface imaging-based analysis of intrafraction motion for breast radiotherapy patients.Journal of [place], n. 6, nov. 2014. ISSN [id number].  Available at: <http://www.jacmp.org/index.php/jacmp/article/view/4957>.    -----------------------------------  Eppie Gibson, MD

## 2018-10-07 DIAGNOSIS — C50011 Malignant neoplasm of nipple and areola, right female breast: Secondary | ICD-10-CM | POA: Diagnosis not present

## 2018-10-08 ENCOUNTER — Encounter: Payer: Self-pay | Admitting: Radiation Oncology

## 2018-10-08 NOTE — Progress Notes (Addendum)
Radiation Oncology         [phone]) [phone] ________________________________  Outpatient follow-up in person  Name: Stacy Walls MRN: [id number]  Date: 10/05/2018  DOB: January 28, 1933  [id number], Hardie Pulley, MD  Truitt Merle, MD   REFERRING PHYSICIAN: Truitt Merle, MD  DIAGNOSIS:    ICD-10-CM   1. Malignant neoplasm of overlapping sites of right breast in female, estrogen receptor positive ([place])  C50.811    Z17.0   2. Malignant neoplasm of central portion of right breast in female, estrogen receptor positive ([place])  C50.111    Z17.0   Cancer Staging Cancer of central portion of right female breast [facility]) Staging form: Breast, AJCC 8th Edition - Clinical stage from 03/30/2018: Stage IB (cT1c(m), cN1, cM0, G2, ER+, PR+, HER2-) - Signed by Truitt Merle, MD on 04/08/2018 - Pathologic stage from 06/07/2018: Stage IB (pT2(m), pN2a, cM0, G2, ER+, PR+, HER2-) - Signed by Gardenia Phlegm, NP on 06/22/2018    CHIEF COMPLAINT:  breast cancer  HISTORY OF PRESENT ILLNESS::Stacy Walls returns for follow-up today. She is ready for simulation  ++Patient was started on Tamoxifen by Dr. Burr Medico on 07/20/18.  Lymphedema issues, if any:  She denies. She has good arm mobility  Pain issues, if any:  She denies.   PREVIOUS RADIATION THERAPY: No  PAST MEDICAL HISTORY:  has a past medical history of Acute cholecystitis, Allergic rhinitis, Anemia, Arthritis, Cancer ([place]), Chest pain, Chronic renal insufficiency, Clostridium difficile colitis, Constipation, Coronary atherosclerosis of native coronary vessel, Cough, Depression, Diabetes mellitus, Dizziness, DM2 (diabetes mellitus, type 2) ([place]), Dyshidrosis, Family history of adverse reaction to anesthesia, GERD (gastroesophageal reflux disease), Gout, HLD (hyperlipidemia), HTN (hypertension), Hypothyroidism, Neck mass, Neck pain, Osteoporosis, Routine general medical examination at a health care facility, Secondary hyperparathyroidism (of  renal origin), Urinary incontinence, and Urinary tract infection.    PAST SURGICAL HISTORY: Past Surgical History:  Procedure Laterality Date  . bone density  08/21/05  . BREAST LUMPECTOMY WITH RADIOACTIVE SEED AND AXILLARY LYMPH NODE DISSECTION Right 06/07/2018   Procedure: RIGHT BREAST LUMPECTOMY WITH BRACKETED RADIOACTIVE SEEDS AND RIGHT COMPLETE AXILLARY LYMPH NODE DISSECTION;  Surgeon: Fanny Skates, MD;  Location: [place];  Service: General;  Laterality: Right;  . C-section (other)  1977  . CHOLECYSTECTOMY    . ELECTROCARDIOGRAM  02/01/06  . L ankle surgery Left 1988  . total left knee Left 2007    FAMILY HISTORY: family history includes Cancer in her brother; Coronary artery disease in her father; Diabetes in her brother, father, and sister; Stroke in her father and sister.  SOCIAL HISTORY:  reports that she has never smoked. She has never used smokeless tobacco. She reports that she does not drink alcohol or use drugs.  ALLERGIES: Ace inhibitors, Azithromycin, Pioglitazone, Sulfonamide derivatives, and Tramadol  MEDICATIONS:  Current Outpatient Medications  Medication Sig Dispense Refill  . allopurinol (ZYLOPRIM) 300 MG tablet Take 1 tablet (300 mg total) by mouth daily. 90 tablet 3  . ALPRAZolam (XANAX) 0.25 MG tablet Take 0.25 mg by mouth 3 (three) times daily as needed for anxiety.    Marland Kitchen aspirin EC 81 MG tablet Take 81 mg by mouth daily.    . cetirizine (ZYRTEC) 10 MG tablet     . diclofenac sodium (VOLTAREN) 1 % GEL APPLY 2 GRAMS EXTERNALLY TO THE AFFECTED AREA FOUR TIMES DAILY 100 g 0  . escitalopram (LEXAPRO) 10 MG tablet Take 10 mg by mouth daily. Patient taking it as needed when depressed.    Marland Kitchen  Ferrous Sulfate (IRON) 325 (65 FE) MG TABS Take 325 mg by mouth daily.     . fluticasone (FLONASE) 50 MCG/ACT nasal spray Place 2 sprays into both nostrils daily as needed.     . furosemide (LASIX) 20 MG tablet Take 1 tablet (20 mg total) by mouth daily. 90 tablet 3  .  levothyroxine (SYNTHROID, LEVOTHROID) 88 MCG tablet Take 1 tablet (88 mcg total) by mouth daily. 90 tablet 3  . losartan-hydrochlorothiazide (HYZAAR) 100-25 MG tablet Take 1 tablet by mouth daily. 90 tablet 3  . magnesium hydroxide (MILK OF MAGNESIA) 400 MG/5ML suspension Take 15 mLs by mouth daily as needed for mild constipation.    . Multiple Vitamins-Minerals (MULTIVITAMIN WOMEN 50+ PO) Take 1 tablet by mouth daily.     Marland Kitchen oxybutynin (DITROPAN) 5 MG tablet 1 tab daily (Patient taking differently: Take 5 mg by mouth daily. ) 90 tablet 3  . Polyethyl Glycol-Propyl Glycol (SYSTANE) 0.4-0.3 % SOLN Place 1 drop into both eyes daily as needed (for dry eyes).    . sitaGLIPtin (JANUVIA) 100 MG tablet TAKE 1/2 TABLET(50 MG) BY MOUTH DAILY (Patient taking differently: Take 50 mg by mouth daily. ) 45 tablet 3  . tamoxifen (NOLVADEX) 20 MG tablet Take 1 tablet (20 mg total) by mouth daily. 30 tablet 3  . triamcinolone ointment (KENALOG) 0.1 % APPLY TOPICALLY TO THE AFFECTED AREA THREE TIMES DAILY AS NEEDED FOR ITCHING (Patient taking differently: Apply 1 application topically 3 (three) times daily as needed (for itching). ) 454 g 0  . vitamin C (ASCORBIC ACID) 500 MG tablet Take 500 mg by mouth 2 (two) times a week.     Marland Kitchen epoetin alfa (PROCRIT) 40347 UNIT/ML injection Inject 10,000 Units into the skin every 30 (thirty) days. Dr. Justin Mend    . HYDROcodone-acetaminophen (NORCO) 5-325 MG tablet Take 1-2 tablets by mouth every 6 (six) hours as needed for moderate pain or severe pain. (Patient not taking: Reported on 10/05/2018) 20 tablet 0  . hydrOXYzine (VISTARIL) 25 MG capsule Take 25 mg by mouth at bedtime as needed for anxiety.     Marland Kitchen omeprazole (PRILOSEC) 20 MG capsule TAKE 1 CAPSULE(20 MG) BY MOUTH DAILY (Patient not taking: No sig reported) 90 capsule 0   No current facility-administered medications for this encounter.     REVIEW OF SYSTEMS: as above  PHYSICAL EXAM:  height is 5' 2"  (1.575 m). Her temporal  temperature is 98.3 F (36.8 C). Her blood pressure is 102/88 and her pulse is 63. Her respiration is 18 and oxygen saturation is 100%.    Gen: NAD Breasts :Right breast lumpectomy/axillary scar show good healing Skin: no rashes over right breast MSK: good ROM in shoulders Ext: no obvious swelling in UE's She is in a Wheelchair today.    LABORATORY DATA:  Lab Results  Component Value Date   WBC 11.5 (H) 06/08/2018   HGB 10.1 (L) 06/08/2018   HCT 31.1 (L) 06/08/2018   MCV 96.0 06/08/2018   PLT 136 (L) 06/08/2018   CMP     Component Value Date/Time   NA 134 (L) 06/08/2018 0214   K 3.7 06/08/2018 0214   CL 100 06/08/2018 0214   CO2 26 06/08/2018 0214   GLUCOSE 182 (H) 06/08/2018 0214   BUN 28 (H) 06/08/2018 0214   CREATININE 1.67 (H) 06/08/2018 0214   CREATININE 1.56 (H) 04/08/2018 1547   CALCIUM 9.8 06/08/2018 0214   CALCIUM 10.5 01/12/2011 1116   PROT 6.3 (L) 05/31/2018 1106  ALBUMIN 3.3 (L) 05/31/2018 1106   AST 24 05/31/2018 1106   AST 20 04/08/2018 1547   ALT 18 05/31/2018 1106   ALT 15 04/08/2018 1547   ALKPHOS 75 05/31/2018 1106   BILITOT 0.7 05/31/2018 1106   BILITOT 0.7 04/08/2018 1547   GFRNONAA 28 (L) 06/08/2018 0214   GFRNONAA 30 (L) 04/08/2018 1547   GFRAA 32 (L) 06/08/2018 0214   GFRAA 35 (L) 04/08/2018 1547          IMPRESSION/PLAN: locally advanced right breast cancer  She is ready for simulation.  I spoke w/ the patient and her son (who was on speaker phone) today.  Given our success in keeping patients safe from the coronavirus during cancer center visits, I now recommend a standard 6 week course of radiotherapy.  I think this is the safest and best option for curing her node positive breast cancer. They are agreeable.  Consent was signed today.  We discussed the patient's diagnosis of breast cancer and general treatment for this, highlighting the role of radiotherapy in the management. We discussed the available radiation techniques, and  focused on the details of logistics and delivery.    We discussed the risks, benefits, and side effects of radiotherapy. Side effects may include but not necessarily be limited to: lymphedema, breast tenderness, fatigue, skin breakdown, internal organ injury (rare). No guarantees of treatment were given. A consent form was signed and placed in the patient's medical record. The patient was encouraged to ask questions that I answered to the best of my ability.   Simulation to occur today.  __________________________________________   Eppie Gibson, MD

## 2018-10-11 ENCOUNTER — Telehealth: Payer: Self-pay | Admitting: Hematology

## 2018-10-11 NOTE — Telephone Encounter (Signed)
Scheduled appt per 6/23 sch message - pt is aware of appt date and time   

## 2018-10-12 ENCOUNTER — Other Ambulatory Visit: Payer: Self-pay

## 2018-10-12 ENCOUNTER — Ambulatory Visit: Payer: Self-pay | Admitting: Radiation Oncology

## 2018-10-12 ENCOUNTER — Inpatient Hospital Stay: Payer: Medicare Other | Admitting: Hematology

## 2018-10-12 ENCOUNTER — Ambulatory Visit: Payer: Medicare Other | Admitting: Radiation Oncology

## 2018-10-12 ENCOUNTER — Ambulatory Visit
Admission: RE | Admit: 2018-10-12 | Discharge: 2018-10-12 | Disposition: A | Discharge status: Home / Self Care | Payer: Medicare Other | Source: Ambulatory Visit | Attending: Radiation Oncology | Admitting: Radiation Oncology

## 2018-10-12 ENCOUNTER — Inpatient Hospital Stay: Payer: Medicare Other

## 2018-10-12 DIAGNOSIS — C50011 Malignant neoplasm of nipple and areola, right female breast: Secondary | ICD-10-CM | POA: Diagnosis not present

## 2018-10-13 ENCOUNTER — Ambulatory Visit
Admission: RE | Admit: 2018-10-13 | Discharge: 2018-10-13 | Disposition: A | Discharge status: Home / Self Care | Payer: Medicare Other | Source: Ambulatory Visit | Attending: Radiation Oncology | Admitting: Radiation Oncology

## 2018-10-13 ENCOUNTER — Other Ambulatory Visit: Payer: Self-pay

## 2018-10-13 DIAGNOSIS — C50011 Malignant neoplasm of nipple and areola, right female breast: Secondary | ICD-10-CM | POA: Diagnosis not present

## 2018-10-14 ENCOUNTER — Ambulatory Visit: Payer: Medicare Other

## 2018-10-17 ENCOUNTER — Other Ambulatory Visit: Payer: Self-pay

## 2018-10-17 ENCOUNTER — Ambulatory Visit
Admission: RE | Admit: 2018-10-17 | Discharge: 2018-10-17 | Disposition: A | Discharge status: Home / Self Care | Payer: Medicare Other | Source: Ambulatory Visit | Attending: Radiation Oncology | Admitting: Radiation Oncology

## 2018-10-17 DIAGNOSIS — C50011 Malignant neoplasm of nipple and areola, right female breast: Secondary | ICD-10-CM | POA: Diagnosis not present

## 2018-10-17 DIAGNOSIS — C50811 Malignant neoplasm of overlapping sites of right female breast: Secondary | ICD-10-CM

## 2018-10-17 DIAGNOSIS — Z170 Estrogen receptor positive status [ER+]: Secondary | ICD-10-CM

## 2018-10-17 MED ORDER — RADIAPLEXRX EX GEL
Freq: Once | CUTANEOUS | Status: AC
  Administered 2018-10-17: 17:00:00 via TOPICAL

## 2018-10-17 MED ORDER — ALRA NON-METALLIC DEODORANT (RAD-ONC)
1.0000 "application " | Freq: Once | TOPICAL | Status: AC
  Administered 2018-10-17: 1 via TOPICAL

## 2018-10-18 ENCOUNTER — Ambulatory Visit
Admission: RE | Admit: 2018-10-18 | Discharge: 2018-10-18 | Disposition: A | Discharge status: Home / Self Care | Payer: Medicare Other | Source: Ambulatory Visit | Attending: Radiation Oncology | Admitting: Radiation Oncology

## 2018-10-18 ENCOUNTER — Other Ambulatory Visit: Payer: Self-pay

## 2018-10-18 DIAGNOSIS — C50011 Malignant neoplasm of nipple and areola, right female breast: Secondary | ICD-10-CM | POA: Diagnosis not present

## 2018-10-19 ENCOUNTER — Ambulatory Visit
Admission: RE | Admit: 2018-10-19 | Discharge: 2018-10-19 | Disposition: A | Discharge status: Home / Self Care | Payer: Medicare Other | Source: Ambulatory Visit | Attending: Radiation Oncology | Admitting: Radiation Oncology

## 2018-10-19 ENCOUNTER — Other Ambulatory Visit: Payer: Self-pay

## 2018-10-19 DIAGNOSIS — Z170 Estrogen receptor positive status [ER+]: Secondary | ICD-10-CM | POA: Insufficient documentation

## 2018-10-19 DIAGNOSIS — C50011 Malignant neoplasm of nipple and areola, right female breast: Secondary | ICD-10-CM | POA: Diagnosis present

## 2018-10-19 DIAGNOSIS — Z510 Encounter for antineoplastic radiation therapy: Secondary | ICD-10-CM | POA: Insufficient documentation

## 2018-10-20 ENCOUNTER — Other Ambulatory Visit: Payer: Self-pay | Admitting: Hematology

## 2018-10-20 ENCOUNTER — Other Ambulatory Visit: Payer: Self-pay

## 2018-10-20 ENCOUNTER — Ambulatory Visit
Admission: RE | Admit: 2018-10-20 | Discharge: 2018-10-20 | Disposition: A | Discharge status: Home / Self Care | Payer: Medicare Other | Source: Ambulatory Visit | Attending: Radiation Oncology | Admitting: Radiation Oncology

## 2018-10-20 DIAGNOSIS — C50011 Malignant neoplasm of nipple and areola, right female breast: Secondary | ICD-10-CM | POA: Diagnosis not present

## 2018-10-24 ENCOUNTER — Ambulatory Visit
Admission: RE | Admit: 2018-10-24 | Discharge: 2018-10-24 | Disposition: A | Discharge status: Home / Self Care | Payer: Medicare Other | Source: Ambulatory Visit | Attending: Radiation Oncology | Admitting: Radiation Oncology

## 2018-10-24 ENCOUNTER — Other Ambulatory Visit: Payer: Self-pay

## 2018-10-24 DIAGNOSIS — C50011 Malignant neoplasm of nipple and areola, right female breast: Secondary | ICD-10-CM | POA: Diagnosis not present

## 2018-10-25 ENCOUNTER — Ambulatory Visit
Admission: RE | Admit: 2018-10-25 | Discharge: 2018-10-25 | Disposition: A | Discharge status: Home / Self Care | Payer: Medicare Other | Source: Ambulatory Visit | Attending: Radiation Oncology | Admitting: Radiation Oncology

## 2018-10-25 DIAGNOSIS — C50011 Malignant neoplasm of nipple and areola, right female breast: Secondary | ICD-10-CM | POA: Diagnosis not present

## 2018-10-26 ENCOUNTER — Ambulatory Visit
Admission: RE | Admit: 2018-10-26 | Discharge: 2018-10-26 | Disposition: A | Discharge status: Home / Self Care | Payer: Medicare Other | Source: Ambulatory Visit | Attending: Radiation Oncology | Admitting: Radiation Oncology

## 2018-10-26 ENCOUNTER — Other Ambulatory Visit: Payer: Self-pay

## 2018-10-26 DIAGNOSIS — C50011 Malignant neoplasm of nipple and areola, right female breast: Secondary | ICD-10-CM | POA: Diagnosis not present

## 2018-10-27 ENCOUNTER — Ambulatory Visit
Admission: RE | Admit: 2018-10-27 | Discharge: 2018-10-27 | Disposition: A | Discharge status: Home / Self Care | Payer: Medicare Other | Source: Ambulatory Visit | Attending: Radiation Oncology | Admitting: Radiation Oncology

## 2018-10-27 DIAGNOSIS — C50011 Malignant neoplasm of nipple and areola, right female breast: Secondary | ICD-10-CM | POA: Diagnosis not present

## 2018-10-28 ENCOUNTER — Other Ambulatory Visit: Payer: Medicare Other

## 2018-10-28 ENCOUNTER — Ambulatory Visit
Admission: RE | Admit: 2018-10-28 | Discharge: 2018-10-28 | Disposition: A | Discharge status: Home / Self Care | Payer: Medicare Other | Source: Ambulatory Visit | Attending: Radiation Oncology | Admitting: Radiation Oncology

## 2018-10-28 DIAGNOSIS — C50011 Malignant neoplasm of nipple and areola, right female breast: Secondary | ICD-10-CM | POA: Diagnosis not present

## 2018-10-31 ENCOUNTER — Ambulatory Visit
Admission: RE | Admit: 2018-10-31 | Discharge: 2018-10-31 | Disposition: A | Discharge status: Home / Self Care | Payer: Medicare Other | Source: Ambulatory Visit | Attending: Radiation Oncology | Admitting: Radiation Oncology

## 2018-10-31 DIAGNOSIS — Z170 Estrogen receptor positive status [ER+]: Secondary | ICD-10-CM

## 2018-10-31 DIAGNOSIS — C50011 Malignant neoplasm of nipple and areola, right female breast: Secondary | ICD-10-CM | POA: Diagnosis not present

## 2018-10-31 DIAGNOSIS — C50811 Malignant neoplasm of overlapping sites of right female breast: Secondary | ICD-10-CM

## 2018-10-31 MED ORDER — RADIAPLEXRX EX GEL
Freq: Once | CUTANEOUS | Status: AC
  Administered 2018-10-31: 16:00:00 via TOPICAL

## 2018-11-01 ENCOUNTER — Other Ambulatory Visit: Payer: Self-pay

## 2018-11-01 ENCOUNTER — Ambulatory Visit
Admission: RE | Admit: 2018-11-01 | Discharge: 2018-11-01 | Disposition: A | Discharge status: Home / Self Care | Payer: Medicare Other | Source: Ambulatory Visit | Attending: Radiation Oncology | Admitting: Radiation Oncology

## 2018-11-01 DIAGNOSIS — C50011 Malignant neoplasm of nipple and areola, right female breast: Secondary | ICD-10-CM | POA: Diagnosis not present

## 2018-11-02 ENCOUNTER — Ambulatory Visit
Admission: RE | Admit: 2018-11-02 | Discharge: 2018-11-02 | Disposition: A | Discharge status: Home / Self Care | Payer: Medicare Other | Source: Ambulatory Visit | Attending: Radiation Oncology | Admitting: Radiation Oncology

## 2018-11-02 DIAGNOSIS — C50011 Malignant neoplasm of nipple and areola, right female breast: Secondary | ICD-10-CM | POA: Diagnosis not present

## 2018-11-03 ENCOUNTER — Other Ambulatory Visit: Payer: Self-pay

## 2018-11-03 ENCOUNTER — Ambulatory Visit
Admission: RE | Admit: 2018-11-03 | Discharge: 2018-11-03 | Disposition: A | Discharge status: Home / Self Care | Payer: Medicare Other | Source: Ambulatory Visit | Attending: Radiation Oncology | Admitting: Radiation Oncology

## 2018-11-03 DIAGNOSIS — C50011 Malignant neoplasm of nipple and areola, right female breast: Secondary | ICD-10-CM | POA: Diagnosis not present

## 2018-11-04 ENCOUNTER — Ambulatory Visit
Admission: RE | Admit: 2018-11-04 | Discharge: 2018-11-04 | Disposition: A | Discharge status: Home / Self Care | Payer: Medicare Other | Source: Ambulatory Visit | Attending: Radiation Oncology | Admitting: Radiation Oncology

## 2018-11-04 ENCOUNTER — Other Ambulatory Visit: Payer: Self-pay

## 2018-11-04 DIAGNOSIS — C50011 Malignant neoplasm of nipple and areola, right female breast: Secondary | ICD-10-CM | POA: Diagnosis not present

## 2018-11-07 ENCOUNTER — Other Ambulatory Visit: Payer: Self-pay

## 2018-11-07 ENCOUNTER — Ambulatory Visit
Admission: RE | Admit: 2018-11-07 | Discharge: 2018-11-07 | Disposition: A | Discharge status: Home / Self Care | Payer: Medicare Other | Source: Ambulatory Visit | Attending: Radiation Oncology | Admitting: Radiation Oncology

## 2018-11-07 DIAGNOSIS — C50011 Malignant neoplasm of nipple and areola, right female breast: Secondary | ICD-10-CM | POA: Diagnosis not present

## 2018-11-08 ENCOUNTER — Ambulatory Visit
Admission: RE | Admit: 2018-11-08 | Discharge: 2018-11-08 | Disposition: A | Discharge status: Home / Self Care | Payer: Medicare Other | Source: Ambulatory Visit | Attending: Radiation Oncology | Admitting: Radiation Oncology

## 2018-11-08 ENCOUNTER — Other Ambulatory Visit: Payer: Self-pay

## 2018-11-08 DIAGNOSIS — C50011 Malignant neoplasm of nipple and areola, right female breast: Secondary | ICD-10-CM | POA: Diagnosis not present

## 2018-11-09 ENCOUNTER — Ambulatory Visit
Admission: RE | Admit: 2018-11-09 | Discharge: 2018-11-09 | Disposition: A | Discharge status: Home / Self Care | Payer: Medicare Other | Source: Ambulatory Visit | Attending: Radiation Oncology | Admitting: Radiation Oncology

## 2018-11-09 ENCOUNTER — Other Ambulatory Visit: Payer: Self-pay

## 2018-11-09 DIAGNOSIS — C50011 Malignant neoplasm of nipple and areola, right female breast: Secondary | ICD-10-CM | POA: Diagnosis not present

## 2018-11-10 ENCOUNTER — Telehealth: Payer: Self-pay

## 2018-11-10 ENCOUNTER — Ambulatory Visit
Admission: RE | Admit: 2018-11-10 | Discharge: 2018-11-10 | Disposition: A | Discharge status: Home / Self Care | Payer: Medicare Other | Source: Ambulatory Visit | Attending: Radiation Oncology | Admitting: Radiation Oncology

## 2018-11-10 DIAGNOSIS — C50011 Malignant neoplasm of nipple and areola, right female breast: Secondary | ICD-10-CM | POA: Diagnosis not present

## 2018-11-10 NOTE — Telephone Encounter (Signed)
Patient called requesting her appointments on 8/7 with Dr. Burr Medico be moved to 8/10, a scheduling message was sent.

## 2018-11-11 ENCOUNTER — Ambulatory Visit
Admission: RE | Admit: 2018-11-11 | Discharge: 2018-11-11 | Disposition: A | Discharge status: Home / Self Care | Payer: Medicare Other | Source: Ambulatory Visit | Attending: Radiation Oncology | Admitting: Radiation Oncology

## 2018-11-11 ENCOUNTER — Other Ambulatory Visit: Payer: Self-pay

## 2018-11-11 DIAGNOSIS — C50011 Malignant neoplasm of nipple and areola, right female breast: Secondary | ICD-10-CM | POA: Diagnosis not present

## 2018-11-14 ENCOUNTER — Other Ambulatory Visit: Payer: Self-pay

## 2018-11-14 ENCOUNTER — Ambulatory Visit: Payer: Medicare Other | Admitting: Radiation Oncology

## 2018-11-14 ENCOUNTER — Ambulatory Visit
Admission: RE | Admit: 2018-11-14 | Discharge: 2018-11-14 | Disposition: A | Discharge status: Home / Self Care | Payer: Medicare Other | Source: Ambulatory Visit | Attending: Radiation Oncology | Admitting: Radiation Oncology

## 2018-11-14 DIAGNOSIS — C50011 Malignant neoplasm of nipple and areola, right female breast: Secondary | ICD-10-CM | POA: Diagnosis not present

## 2018-11-14 DIAGNOSIS — Z170 Estrogen receptor positive status [ER+]: Secondary | ICD-10-CM

## 2018-11-14 DIAGNOSIS — C50811 Malignant neoplasm of overlapping sites of right female breast: Secondary | ICD-10-CM

## 2018-11-14 MED ORDER — RADIAPLEXRX EX GEL
Freq: Once | CUTANEOUS | Status: AC
  Administered 2018-11-14: 17:00:00 via TOPICAL

## 2018-11-15 ENCOUNTER — Ambulatory Visit
Admission: RE | Admit: 2018-11-15 | Discharge: 2018-11-15 | Disposition: A | Discharge status: Home / Self Care | Payer: Medicare Other | Source: Ambulatory Visit | Attending: Radiation Oncology | Admitting: Radiation Oncology

## 2018-11-15 DIAGNOSIS — C50011 Malignant neoplasm of nipple and areola, right female breast: Secondary | ICD-10-CM | POA: Diagnosis not present

## 2018-11-16 ENCOUNTER — Other Ambulatory Visit: Payer: Self-pay

## 2018-11-16 ENCOUNTER — Ambulatory Visit
Admission: RE | Admit: 2018-11-16 | Discharge: 2018-11-16 | Disposition: A | Discharge status: Home / Self Care | Payer: Medicare Other | Source: Ambulatory Visit | Attending: Radiation Oncology | Admitting: Radiation Oncology

## 2018-11-16 DIAGNOSIS — C50011 Malignant neoplasm of nipple and areola, right female breast: Secondary | ICD-10-CM | POA: Diagnosis not present

## 2018-11-17 ENCOUNTER — Ambulatory Visit
Admission: RE | Admit: 2018-11-17 | Discharge: 2018-11-17 | Disposition: A | Discharge status: Home / Self Care | Payer: Medicare Other | Source: Ambulatory Visit | Attending: Radiation Oncology | Admitting: Radiation Oncology

## 2018-11-17 DIAGNOSIS — C50011 Malignant neoplasm of nipple and areola, right female breast: Secondary | ICD-10-CM | POA: Diagnosis not present

## 2018-11-18 ENCOUNTER — Ambulatory Visit: Payer: Medicare Other

## 2018-11-18 ENCOUNTER — Ambulatory Visit
Admission: RE | Admit: 2018-11-18 | Discharge: 2018-11-18 | Disposition: A | Discharge status: Home / Self Care | Payer: Medicare Other | Source: Ambulatory Visit | Attending: Radiation Oncology | Admitting: Radiation Oncology

## 2018-11-18 ENCOUNTER — Other Ambulatory Visit: Payer: Self-pay

## 2018-11-18 DIAGNOSIS — C50011 Malignant neoplasm of nipple and areola, right female breast: Secondary | ICD-10-CM | POA: Diagnosis not present

## 2018-11-21 ENCOUNTER — Other Ambulatory Visit: Payer: Self-pay

## 2018-11-21 ENCOUNTER — Ambulatory Visit
Admission: RE | Admit: 2018-11-21 | Discharge: 2018-11-21 | Disposition: A | Discharge status: Home / Self Care | Payer: Medicare Other | Source: Ambulatory Visit | Attending: Radiation Oncology | Admitting: Radiation Oncology

## 2018-11-21 ENCOUNTER — Other Ambulatory Visit: Payer: Self-pay | Admitting: Endocrinology

## 2018-11-21 ENCOUNTER — Ambulatory Visit: Payer: Medicare Other

## 2018-11-21 DIAGNOSIS — C50011 Malignant neoplasm of nipple and areola, right female breast: Secondary | ICD-10-CM | POA: Insufficient documentation

## 2018-11-21 DIAGNOSIS — Z170 Estrogen receptor positive status [ER+]: Secondary | ICD-10-CM | POA: Diagnosis not present

## 2018-11-21 DIAGNOSIS — Z510 Encounter for antineoplastic radiation therapy: Secondary | ICD-10-CM | POA: Insufficient documentation

## 2018-11-21 NOTE — Telephone Encounter (Signed)
Please refill x 1 F/u is due  

## 2018-11-22 ENCOUNTER — Other Ambulatory Visit: Payer: Self-pay

## 2018-11-22 ENCOUNTER — Ambulatory Visit
Admission: RE | Admit: 2018-11-22 | Discharge: 2018-11-22 | Disposition: A | Discharge status: Home / Self Care | Payer: Medicare Other | Source: Ambulatory Visit | Attending: Radiation Oncology | Admitting: Radiation Oncology

## 2018-11-22 DIAGNOSIS — Z170 Estrogen receptor positive status [ER+]: Secondary | ICD-10-CM

## 2018-11-22 DIAGNOSIS — C50811 Malignant neoplasm of overlapping sites of right female breast: Secondary | ICD-10-CM

## 2018-11-22 DIAGNOSIS — C50011 Malignant neoplasm of nipple and areola, right female breast: Secondary | ICD-10-CM | POA: Diagnosis not present

## 2018-11-22 MED ORDER — RADIAPLEXRX EX GEL
Freq: Once | CUTANEOUS | Status: AC
  Administered 2018-11-22: 16:00:00 via TOPICAL

## 2018-11-23 ENCOUNTER — Other Ambulatory Visit: Payer: Self-pay | Admitting: Endocrinology

## 2018-11-23 ENCOUNTER — Ambulatory Visit
Admission: RE | Admit: 2018-11-23 | Discharge: 2018-11-23 | Disposition: A | Discharge status: Home / Self Care | Payer: Medicare Other | Source: Ambulatory Visit | Attending: Radiation Oncology | Admitting: Radiation Oncology

## 2018-11-23 DIAGNOSIS — C50011 Malignant neoplasm of nipple and areola, right female breast: Secondary | ICD-10-CM | POA: Diagnosis not present

## 2018-11-24 ENCOUNTER — Other Ambulatory Visit: Payer: Self-pay

## 2018-11-24 ENCOUNTER — Ambulatory Visit
Admission: RE | Admit: 2018-11-24 | Discharge: 2018-11-24 | Disposition: A | Discharge status: Home / Self Care | Payer: Medicare Other | Source: Ambulatory Visit | Attending: Radiation Oncology | Admitting: Radiation Oncology

## 2018-11-24 ENCOUNTER — Ambulatory Visit: Payer: Medicare Other

## 2018-11-24 DIAGNOSIS — C50011 Malignant neoplasm of nipple and areola, right female breast: Secondary | ICD-10-CM | POA: Diagnosis not present

## 2018-11-25 ENCOUNTER — Encounter: Payer: Self-pay | Admitting: *Deleted

## 2018-11-25 ENCOUNTER — Encounter: Payer: Self-pay | Admitting: Radiation Oncology

## 2018-11-25 ENCOUNTER — Ambulatory Visit: Payer: Medicare Other | Admitting: Hematology

## 2018-11-25 ENCOUNTER — Other Ambulatory Visit: Payer: Medicare Other

## 2018-11-25 ENCOUNTER — Ambulatory Visit
Admission: RE | Admit: 2018-11-25 | Discharge: 2018-11-25 | Disposition: A | Discharge status: Home / Self Care | Payer: Medicare Other | Source: Ambulatory Visit | Attending: Radiation Oncology | Admitting: Radiation Oncology

## 2018-11-25 DIAGNOSIS — C50011 Malignant neoplasm of nipple and areola, right female breast: Secondary | ICD-10-CM | POA: Diagnosis not present

## 2018-11-28 ENCOUNTER — Inpatient Hospital Stay: Payer: Medicare Other | Attending: Nurse Practitioner

## 2018-11-28 ENCOUNTER — Other Ambulatory Visit: Payer: Self-pay

## 2018-11-28 ENCOUNTER — Inpatient Hospital Stay (HOSPITAL_BASED_OUTPATIENT_CLINIC_OR_DEPARTMENT_OTHER): Payer: Medicare Other | Admitting: Nurse Practitioner

## 2018-11-28 ENCOUNTER — Encounter: Payer: Self-pay | Admitting: Nurse Practitioner

## 2018-11-28 VITALS — BP 156/65 | HR 71 | Temp 99.1°F | Resp 17 | Ht 62.0 in | Wt 246.9 lb

## 2018-11-28 DIAGNOSIS — C50111 Malignant neoplasm of central portion of right female breast: Secondary | ICD-10-CM | POA: Insufficient documentation

## 2018-11-28 DIAGNOSIS — A0472 Enterocolitis due to Clostridium difficile, not specified as recurrent: Secondary | ICD-10-CM | POA: Diagnosis not present

## 2018-11-28 DIAGNOSIS — Z170 Estrogen receptor positive status [ER+]: Secondary | ICD-10-CM | POA: Insufficient documentation

## 2018-11-28 DIAGNOSIS — E1122 Type 2 diabetes mellitus with diabetic chronic kidney disease: Secondary | ICD-10-CM | POA: Insufficient documentation

## 2018-11-28 DIAGNOSIS — Z7982 Long term (current) use of aspirin: Secondary | ICD-10-CM | POA: Diagnosis not present

## 2018-11-28 DIAGNOSIS — Z79810 Long term (current) use of selective estrogen receptor modulators (SERMs): Secondary | ICD-10-CM | POA: Insufficient documentation

## 2018-11-28 DIAGNOSIS — J309 Allergic rhinitis, unspecified: Secondary | ICD-10-CM | POA: Diagnosis not present

## 2018-11-28 DIAGNOSIS — C773 Secondary and unspecified malignant neoplasm of axilla and upper limb lymph nodes: Secondary | ICD-10-CM | POA: Diagnosis not present

## 2018-11-28 DIAGNOSIS — I129 Hypertensive chronic kidney disease with stage 1 through stage 4 chronic kidney disease, or unspecified chronic kidney disease: Secondary | ICD-10-CM | POA: Insufficient documentation

## 2018-11-28 DIAGNOSIS — F329 Major depressive disorder, single episode, unspecified: Secondary | ICD-10-CM | POA: Insufficient documentation

## 2018-11-28 DIAGNOSIS — L301 Dyshidrosis [pompholyx]: Secondary | ICD-10-CM | POA: Insufficient documentation

## 2018-11-28 DIAGNOSIS — M109 Gout, unspecified: Secondary | ICD-10-CM | POA: Insufficient documentation

## 2018-11-28 DIAGNOSIS — D631 Anemia in chronic kidney disease: Secondary | ICD-10-CM | POA: Insufficient documentation

## 2018-11-28 DIAGNOSIS — I2510 Atherosclerotic heart disease of native coronary artery without angina pectoris: Secondary | ICD-10-CM | POA: Insufficient documentation

## 2018-11-28 DIAGNOSIS — Z923 Personal history of irradiation: Secondary | ICD-10-CM | POA: Insufficient documentation

## 2018-11-28 DIAGNOSIS — Z79899 Other long term (current) drug therapy: Secondary | ICD-10-CM | POA: Insufficient documentation

## 2018-11-28 DIAGNOSIS — E785 Hyperlipidemia, unspecified: Secondary | ICD-10-CM | POA: Insufficient documentation

## 2018-11-28 DIAGNOSIS — M810 Age-related osteoporosis without current pathological fracture: Secondary | ICD-10-CM | POA: Diagnosis not present

## 2018-11-28 DIAGNOSIS — Z7984 Long term (current) use of oral hypoglycemic drugs: Secondary | ICD-10-CM | POA: Diagnosis not present

## 2018-11-28 DIAGNOSIS — N183 Chronic kidney disease, stage 3 (moderate): Secondary | ICD-10-CM | POA: Insufficient documentation

## 2018-11-28 DIAGNOSIS — K219 Gastro-esophageal reflux disease without esophagitis: Secondary | ICD-10-CM | POA: Insufficient documentation

## 2018-11-28 DIAGNOSIS — K5900 Constipation, unspecified: Secondary | ICD-10-CM | POA: Diagnosis not present

## 2018-11-28 DIAGNOSIS — E039 Hypothyroidism, unspecified: Secondary | ICD-10-CM | POA: Diagnosis not present

## 2018-11-28 LAB — CMP (CANCER CENTER ONLY)
ALT: 15 U/L (ref 0–44)
AST: 22 U/L (ref 15–41)
Albumin: 3.3 g/dL — ABNORMAL LOW (ref 3.5–5.0)
Alkaline Phosphatase: 61 U/L (ref 38–126)
Anion gap: 10 (ref 5–15)
BUN: 29 mg/dL — ABNORMAL HIGH (ref 8–23)
CO2: 22 mmol/L (ref 22–32)
Calcium: 9.8 mg/dL (ref 8.9–10.3)
Chloride: 98 mmol/L (ref 98–111)
Creatinine: 1.83 mg/dL — ABNORMAL HIGH (ref 0.44–1.00)
GFR, Est AFR Am: 29 mL/min — ABNORMAL LOW (ref >60)
GFR, Estimated: 25 mL/min — ABNORMAL LOW (ref >60)
Glucose, Bld: 102 mg/dL — ABNORMAL HIGH (ref 70–99)
Potassium: 4.2 mmol/L (ref 3.5–5.1)
Sodium: 130 mmol/L — ABNORMAL LOW (ref 135–145)
Total Bilirubin: 0.5 mg/dL (ref 0.3–1.2)
Total Protein: 6 g/dL — ABNORMAL LOW (ref 6.5–8.1)

## 2018-11-28 LAB — CBC WITH DIFFERENTIAL (CANCER CENTER ONLY)
Abs Immature Granulocytes: 0.01 10*3/uL (ref 0.00–0.07)
Basophils Absolute: 0 10*3/uL (ref 0.0–0.1)
Basophils Relative: 1 %
Eosinophils Absolute: 0.2 10*3/uL (ref 0.0–0.5)
Eosinophils Relative: 5 %
HCT: 30 % — ABNORMAL LOW (ref 36.0–46.0)
Hemoglobin: 9.8 g/dL — ABNORMAL LOW (ref 12.0–15.0)
Immature Granulocytes: 0 %
Lymphocytes Relative: 19 %
Lymphs Abs: 0.8 10*3/uL (ref 0.7–4.0)
MCH: 30.2 pg (ref 26.0–34.0)
MCHC: 32.7 g/dL (ref 30.0–36.0)
MCV: 92.6 fL (ref 80.0–100.0)
Monocytes Absolute: 0.7 10*3/uL (ref 0.1–1.0)
Monocytes Relative: 17 %
Neutro Abs: 2.3 10*3/uL (ref 1.7–7.7)
Neutrophils Relative %: 58 %
Platelet Count: 100 10*3/uL — ABNORMAL LOW (ref 150–400)
RBC: 3.24 MIL/uL — ABNORMAL LOW (ref 3.87–5.11)
RDW: 12.2 % (ref 11.5–15.5)
WBC Count: 4 10*3/uL (ref 4.0–10.5)
nRBC: 0 % (ref 0.0–0.2)

## 2018-11-28 NOTE — Progress Notes (Signed)
[name]   [phone] [phone]   Clinic Follow up Note   Patient Care Team: Stacy Billing, MD (Inactive) as PCP - General (Internal Medicine) Stacy Pain, MD as PCP - Cardiology (Cardiology) Stacy Oh, MD (Nephrology) Stacy Breslow, MD (Orthopedic Surgery) Stacy Pigg, MD as Consulting Physician (Obstetrics and Gynecology) Stacy Merle, MD as Consulting Physician (Hematology) Stacy Skates, MD as Consulting Physician (General Surgery) 11/28/2018  CHIEF COMPLAINT: f/u right breast cancer    SUMMARY OF ONCOLOGIC HISTORY: Oncology History Overview Note  Cancer Staging Cancer of central portion of right female breast [facility]) Staging form: Breast, AJCC 8th Edition - Clinical stage from 03/30/2018: Stage IB (cT1c(m), cN1, cM0, G2, ER+, PR+, HER2-) - Signed by Stacy Merle, MD on 04/08/2018 - Pathologic stage from 06/07/2018: Stage IB (pT2(m), pN2a, cM0, G2, ER+, PR+, HER2-) - Signed by Stacy Phlegm, NP on 06/22/2018     Cancer of central portion of right female breast ([place])  01/13/2018 Mammogram   Diagnostic Mammogram 01/13/18  RECOMMENDATION: 1. Ultrasound-guided biopsies of both masses in the RIGHT breast identified by ultrasound at the 12 o'clock axis (7 cm from the nipple measuring 1.1 x 0.8 x 0.7 cm and 5 cm from the nipple measuring 1 x 0.8 x 0.9 cm respectively). 2. Ultrasound-guided biopsy of the most prominent lymph node in the RIGHT axilla. 3. Postprocedure mammogram to ensure that the masses correspond to the extent of the asymmetry/distortion seen on mammogram. Additional stereotactic biopsy may be needed if the clips do not approximate the anterior and posterior extents. 4. Depending on the location of the post biopsy clips, and if breast conservation surgery is considered, stereotactic biopsy may be also needed for the coarse heterogeneous calcifications within the slightly outer RIGHT breast, measuring  8 mm extent, located 1.2 cm from the asymmetry/distortion.   03/30/2018 Cancer Staging   Staging form: Breast, AJCC 8th Edition - Clinical stage from 03/30/2018: Stage IB (cT1c(m), cN1, cM0, G2, ER+, PR+, HER2-) - Signed by Stacy Merle, MD on 04/08/2018   03/30/2018 Initial Biopsy   Diagnosis 03/30/18 1. Breast, right, needle core biopsy, 12 o'clock position, 5cm from nipple - INVASIVE LOBULAR CARCINOMA, GRADE 2. SEE NOTE. - LOBULAR CARCINOMA IN SITU, INTERMEDIATE NUCLEAR GRADE. 2. Breast, right, needle core biopsy, 12 o'clock position 7cfn - INVASIVE LOBULAR CARCINOMA, GRADE 2. SEE NOTE. 1 of 3 FINAL for Stacy Walls, Stacy Walls ([id number]) Diagnosis(continued) - LOBULAR CARCINOMA IN SITU, INTERMEDIATE NUCLEAR GRADE. 3. Lymph node, needle/core biopsy, right axilla - METASTATIC LOBULAR CARCINOMA TO LYMPH NODE.   03/30/2018 Receptors her2   Results: IMMUNOHISTOCHEMICAL AND MORPHOMETRIC ANALYSIS PERFORMED MANUALLY The tumor cells are NEGATIVE for Her2 (1+). Estrogen Receptor: 100%, POSITIVE, STRONG STAINING INTENSITY Progesterone Receptor: 70%, POSITIVE, STRONG STAINING INTENSITY Proliferation Marker Ki67: 10%   04/08/2018 Initial Diagnosis   Cancer of central portion of right female breast ([place])   04/27/2018 Imaging   CT CAP W Contrast 04/27/18  IMPRESSION: 1. Right axillary and right subpectoral lymphadenopathy, suspicious for metastatic disease. PET-CT may prove helpful to further evaluate. 2. Cholelithiasis. 3. Colonic diverticulosis without diverticulitis. 4.  Aortic Atherosclerois (ICD10-170.0)   04/28/2018 Imaging   Bone Scan 04/28/18  IMPRESSION: No scintigraphic evidence skeletal metastasis. Severe degenerative uptake in the medial compartment RIGHT knee.   06/07/2018 Cancer Staging   Staging form: Breast, AJCC 8th Edition - Pathologic stage from 06/07/2018: Stage IB (pT2(m), pN2a, cM0, G2, ER+, PR+, HER2-) - Signed by Stacy Phlegm, NP on 06/22/2018  06/07/2018  Surgery   RIGHT BREAST LUMPECTOMY WITH BRACKETED RADIOACTIVE SEEDS AND RIGHT COMPLETE AXILLARY LYMPH NODE DISSECTION by Stacy. Dalbert Walls 06/07/18    06/07/2018 Pathology Results   Diagnosis 06/07/18 1. Breast, lumpectomy, right with radioactive seeds - INVASIVE LOBULAR CARCINOMA, MULTIFOCAL, LARGER FOCUS IS 3 CM, NOTTINGHAM GRADE 2 OF 3. - MARGINS OF RESECTION ARE NOT INVOLVED (CLOSEST MARGIN: LESS THAN 1 MM, ANTERIOR). - BIOPSY SITE CHANGES. - SEE ONCOLOGY TABLE. 2. Lymph nodes, regional resection, right axillary contents - METASTATIC CARCINOMA PRESENT IN SEVEN OF FOURTEEN LYMPH NODES (7/14). 3. Breast, excision, inferior margin - BREAST PARENCHYMA, NEGATIVE FOR CARCINOMA.   10/13/2018 - 11/25/2018 Radiation Therapy   Adjuvant radiation to right breat per Stacy Walls     CURRENT THERAPY: adjuvant radiation per Stacy Walls, completed 11/25/18  PENDING adjuvant anti-estrogen therapy   INTERVAL HISTORY: Stacy Walls returns for follow up as scheduled. She was last seen on 06/2018 by Stacy Walls. She completed adjuvant radiation per Stacy Walls in the interval, last treatment on 11/25/18. She is doing well. Breast is very dark with some discomfort and swelling. Her neck is raw. She uses ointment. Wears a compression bra during the day. She is easily fatigued but remains functional. Lives with grandson. She manages well at home. Tries to eat and drink well. She continues tamoxifen. Denies bone Walls, hot flashes. Denies new leg swelling. Denies vaginal or other bleeding. She has constipation, manages with milk of magnesia. She has bone density scan coming up. Usually sees nephrologist once a year, has not seen Stacy Walls this year.    MEDICAL HISTORY:  Past Medical History:  Diagnosis Date  . Acute cholecystitis   . Allergic rhinitis   . Anemia   . Arthritis   . Cancer [facility])    Right breast  . Chest Walls   . Chronic renal insufficiency    followed by Stacy Walls stage 3  . Clostridium difficile colitis   .  Constipation   . Coronary atherosclerosis of native coronary vessel   . Cough   . Depression    situational - husband died 06-13-18  . Diabetes mellitus   . Dizziness   . DM2 (diabetes mellitus, type 2) ([place])   . Dyshidrosis   . Family history of adverse reaction to anesthesia    daughter - nausea  . GERD (gastroesophageal reflux disease)   . Gout   . HLD (hyperlipidemia)   . HTN (hypertension)   . Hypothyroidism   . Neck mass   . Neck Walls   . Osteoporosis   . Routine general medical examination at a health care facility   . Secondary hyperparathyroidism (of renal origin)   . Urinary incontinence   . Urinary tract infection     SURGICAL HISTORY: Past Surgical History:  Procedure Laterality Date  . bone density  08/21/05  . BREAST LUMPECTOMY WITH RADIOACTIVE SEED AND AXILLARY LYMPH NODE DISSECTION Right 06/07/2018   Procedure: RIGHT BREAST LUMPECTOMY WITH BRACKETED RADIOACTIVE SEEDS AND RIGHT COMPLETE AXILLARY LYMPH NODE DISSECTION;  Surgeon: Stacy Skates, MD;  Location: [place];  Service: General;  Laterality: Right;  . C-section (other)  1977  . CHOLECYSTECTOMY    . ELECTROCARDIOGRAM  02/01/06  . L ankle surgery Left 1988  . total left knee Left 2007    I have reviewed the social history and family history with the patient and they are unchanged from previous note.  ALLERGIES:  is allergic to ace inhibitors; azithromycin; pioglitazone; sulfonamide derivatives; and tramadol.  MEDICATIONS:  Current Outpatient Medications  Medication Sig Dispense Refill  . allopurinol (ZYLOPRIM) 300 MG tablet Take 1 tablet (300 mg total) by mouth daily. 90 tablet 3  . ALPRAZolam (XANAX) 0.25 MG tablet Take 0.25 mg by mouth 3 (three) times daily as needed for anxiety.    Marland Kitchen aspirin EC 81 MG tablet Take 81 mg by mouth daily.    . cetirizine (ZYRTEC) 10 MG tablet     . epoetin alfa (PROCRIT) 70350 UNIT/ML injection Inject 10,000 Units into the skin every 30 (thirty) days. Stacy. Justin Walls    .  escitalopram (LEXAPRO) 10 MG tablet Take 10 mg by mouth daily. Patient taking it as needed when depressed.    . Ferrous Sulfate (IRON) 325 (65 FE) MG TABS Take 325 mg by mouth daily.     . fluticasone (FLONASE) 50 MCG/ACT nasal spray Place 2 sprays into both nostrils daily as needed.     . furosemide (LASIX) 20 MG tablet Take 1 tablet (20 mg total) by mouth daily. 90 tablet 3  . hydrOXYzine (VISTARIL) 25 MG capsule Take 25 mg by mouth at bedtime as needed for anxiety.     Marland Kitchen levothyroxine (SYNTHROID, LEVOTHROID) 88 MCG tablet Take 1 tablet (88 mcg total) by mouth daily. 90 tablet 3  . losartan-hydrochlorothiazide (HYZAAR) 100-25 MG tablet Take 1 tablet by mouth daily. 90 tablet 3  . magnesium hydroxide (MILK OF MAGNESIA) 400 MG/5ML suspension Take 15 mLs by mouth daily as needed for mild constipation.    . Multiple Vitamins-Minerals (MULTIVITAMIN WOMEN 50+ PO) Take 1 tablet by mouth daily.     Marland Kitchen omeprazole (PRILOSEC) 20 MG capsule TAKE 1 CAPSULE(20 MG) BY MOUTH DAILY 90 capsule 0  . oxybutynin (DITROPAN) 5 MG tablet 1 tab daily (Patient taking differently: Take 5 mg by mouth daily. ) 90 tablet 3  . Polyethyl Glycol-Propyl Glycol (SYSTANE) 0.4-0.3 % SOLN Place 1 drop into both eyes daily as needed (for dry eyes).    . sitaGLIPtin (JANUVIA) 100 MG tablet TAKE 1/2 TABLET(50 MG) BY MOUTH DAILY 15 tablet 0  . tamoxifen (NOLVADEX) 20 MG tablet TAKE 1 TABLET(20 MG) BY MOUTH DAILY 30 tablet 3  . triamcinolone ointment (KENALOG) 0.1 % APPLY TOPICALLY TO THE AFFECTED AREA THREE TIMES DAILY AS NEEDED FOR ITCHING (Patient taking differently: Apply 1 application topically 3 (three) times daily as needed (for itching). ) 454 g 0  . vitamin C (ASCORBIC ACID) 500 MG tablet Take 500 mg by mouth 2 (two) times a week.     . diclofenac sodium (VOLTAREN) 1 % GEL APPLY 2 GRAMS EXTERNALLY TO THE AFFECTED AREA FOUR TIMES DAILY (Patient not taking: Reported on 11/28/2018) 100 g 0  . HYDROcodone-acetaminophen (NORCO) 5-325 MG  tablet Take 1-2 tablets by mouth every 6 (six) hours as needed for moderate Walls or severe Walls. (Patient not taking: Reported on 11/28/2018) 20 tablet 0   No current facility-administered medications for this visit.     PHYSICAL EXAMINATION: ECOG PERFORMANCE STATUS: 1 - Symptomatic but completely ambulatory  Vitals:   11/28/18 1333  BP: (!) 156/65  Pulse: 71  Resp: 17  Temp: 99.1 F (37.3 C)  SpO2: 100%   Filed Weights   11/28/18 1333  Weight: 246 lb 14.4 oz (112 kg)    GENERAL:alert, no distress and comfortable SKIN: no rash  EYES:  sclera clear LUNGS: respirations even and unlabored  HEART:  Musculoskeletal:no cyanosis of digits  NEURO: alert & oriented x 3 with fluent speech Breast  exam: s/p right lumpectomy and radiation. Breast is diffusely hyperpigmented with mild to moderate edema. Some peeling in the inframammary fold. No palpable mass in either breast or axilla. Right neck with dry desquamation   LABORATORY DATA:  I have reviewed the data as listed CBC Latest Ref Rng & Units 11/28/2018 06/08/2018 06/07/2018  WBC 4.0 - 10.5 K/uL 4.0 11.5(H) 10.9(H)  Hemoglobin 12.0 - 15.0 g/dL 9.8(L) 10.1(L) 10.7(L)  Hematocrit 36.0 - 46.0 % 30.0(L) 31.1(L) 33.4(L)  Platelets 150 - 400 K/uL 100(L) 136(L) 136(L)     CMP Latest Ref Rng & Units 11/28/2018 06/08/2018 06/07/2018  Glucose 70 - 99 mg/dL 102(H) 182(H) -  BUN 8 - 23 mg/dL 29(H) 28(H) -  Creatinine 0.44 - 1.00 mg/dL 1.83(H) 1.67(H) 1.76(H)  Sodium 135 - 145 mmol/L 130(L) 134(L) -  Potassium 3.5 - 5.1 mmol/L 4.2 3.7 -  Chloride 98 - 111 mmol/L 98 100 -  CO2 22 - 32 mmol/L 22 26 -  Calcium 8.9 - 10.3 mg/dL 9.8 9.8 -  Total Protein 6.5 - 8.1 g/dL 6.0(L) - -  Total Bilirubin 0.3 - 1.2 mg/dL 0.5 - -  Alkaline Phos 38 - 126 U/L 61 - -  AST 15 - 41 U/L 22 - -  ALT 0 - 44 U/L 15 - -      RADIOGRAPHIC STUDIES: I have personally reviewed the radiological images as listed and agreed with the findings in the report. No  results found.   ASSESSMENT & PLAN: Stacy Walls is a 85 y.o. female with   1. Cancer of central right breast,invasive carcinoma,stageIB, pT2c(m)N2aM0, ER+/PR+, HER2-, GradeII -diagnosed in 03/2018. She underwent right breast lumpectomy and axillary lymph node dissection on 06/07/18. 7/14 positive lymph nodes.  -Stacy Walls did not recommend adjuvant chemotherapy given her age, medical comorbidities, and lobular histology which is less sensitive to chemotherapy -to reduce local recurrence risk, she completed adjuvant radiation per Stacy Walls on 11/25/18. She tolerated moderately well with fatigue, skin hyperpigmentation and swelling. Breast is uncomfortable. She has lymphedema, wears compression bra during the day.  -once she recovers from RT, she may benefit from more PT if she has persistent lymphedema.  -she continues tamoxifen, which she started before RT as it was delayed due to covid19 outbreak. She tolerates tamoxifen well overall without significant side effects. Plan to continue total of 5-10 years to reduce the risk of distant cancer recurrence.  -I encouraged her to remain active with ADLs and exercise as tolerated. I reviewed risks of endometrial cancer and DVT on tamoxifen, she knows to monitor for vaginal bleeding and leg swelling/Walls. -labs reviewed, Hg 9.8, plt 100; Cr elevated. Plan to recheck labs in 6 weeks -she will return for lab and f/u with Stacy Walls in 3 months, SCP in 6 weeks -she is due annual mammogram in 12/2018, given she is still recovering from radiation, can delay for a couple months if needed.   2. Iron deficient anemiaand anemia of chronic disease secondary to CKD -She was previously being treated with monthly EPO injections by Stacy. Justin Walls which was stopped due to possible tumor growth.  -anemia is stable overall, Hg 9.8 today -will check iron studies at next visit. For now she takes 1 iron tab daily   3. CKD, stage III, HTN, DM -On Lasix, Hyzaar,  Omeprazole, Januvia -Managed by PCP, nephrologist and cardiologist.  -Cr 1.83, I encouraged her to hydrate. I recommend she f/u with nephrology soon  4. Osteoporosis, OA  -Seen on DEXA  from 03/2014 with lowest T-score of -2.6 in femoral neck.  -She ambulates with cane  -continue calcium and vitamin D -DEXA on 12/08/18  PLAN: -Mammogram due ~12/2018 or once recovered from RT -Continue breast cancer surveillance, continue tamoxifen -Lab in 6 weeks -Lab, f/u with Stacy Walls in 3 months -SCP in 6 months  -F/u with nephrology, Cr 1.8 slightly worse  -DEXA 11/2018 -Continue calcium and vitamin D   Orders Placed This Encounter  Procedures  . MM DIAG BREAST TOMO BILATERAL    Standing Status:   Future    Standing Expiration Date:   11/28/2019    Order Specific Question:   Reason for Exam (SYMPTOM  OR DIAGNOSIS REQUIRED)    Answer:   h/o invasive lobular carcioma 2019 s/p lumpectomy, RT, on tamoxifen    Order Specific Question:   Preferred imaging location?    Answer:   [facility]  . Iron and TIBC    Standing Status:   Standing    Number of Occurrences:   1    Standing Expiration Date:   11/28/2019  . Ferritin    Standing Status:   Standing    Number of Occurrences:   1    Standing Expiration Date:   11/28/2019   All questions were answered. The patient knows to call the clinic with any problems, questions or concerns. No barriers to learning was detected. I spent 20 minutes counseling the patient face to face. The total time spent in the appointment was 25 minutes and more than 50% was on counseling and review of test results     Alla Feeling, NP 11/28/18

## 2018-11-29 ENCOUNTER — Telehealth: Payer: Self-pay | Admitting: Nurse Practitioner

## 2018-11-29 LAB — CANCER ANTIGEN 27.29: CA 27.29: 24.7 U/mL (ref 0.0–38.6)

## 2018-11-29 NOTE — Telephone Encounter (Signed)
Scheduled appt per 8/10 los.  Will print and mail out calendar when I am in the office the week of Aug 24.

## 2018-12-08 ENCOUNTER — Ambulatory Visit
Admission: RE | Admit: 2018-12-08 | Discharge: 2018-12-08 | Disposition: A | Discharge status: Home / Self Care | Payer: Medicare Other | Source: Ambulatory Visit | Attending: Hematology | Admitting: Hematology

## 2018-12-08 ENCOUNTER — Other Ambulatory Visit: Payer: Self-pay

## 2018-12-08 DIAGNOSIS — E2839 Other primary ovarian failure: Secondary | ICD-10-CM

## 2018-12-12 ENCOUNTER — Telehealth: Payer: Self-pay

## 2018-12-12 NOTE — Telephone Encounter (Signed)
-----   Message from Truitt Merle, MD sent at 12/11/2018  9:41 PM EDT ----- My nurse, please let pt know her DEXA result, she has osteoporosis, slightly worse than last time. Due to her CKD, I will not recommend medicine for this, but will continue Tamoxifen and it will improve her bone strength, thanks   Truitt Merle  12/11/2018

## 2018-12-12 NOTE — Telephone Encounter (Signed)
Contacted patient, made aware of the results, and the recommendations. Patient appreciative of the call and had no other questions or concerns.

## 2018-12-20 DIAGNOSIS — Z923 Personal history of irradiation: Secondary | ICD-10-CM

## 2018-12-20 HISTORY — DX: Personal history of irradiation: Z92.3

## 2018-12-23 NOTE — Progress Notes (Signed)
I called the patient today about her upcoming follow-up appointment in radiation oncology.   Given concerns about the COVID-19 pandemic, I offered a phone assessment with the patient to determine if coming to the clinic was necessary. She accepted.  I let the patient know that I had spoken with Dr. Isidore Moos, and she wanted them to know the importance of washing their hands for at least 20 seconds at a time, especially after going out in public, and before they eat.  Limit going out in public whenever possible. Do not touch your face, unless your hands are clean, such as when bathing. Get plenty of rest, eat well, and stay hydrated.   The patient denies any symptomatic concerns.  Specifically, they report good healing of their skin in the radiation fields.  Skin is intact.    I recommended that she continue skin care by applying oil or lotion with vitamin E to the skin in the radiation fields, BID, for 2 more months.  Continue follow-up with medical oncology - follow-up is scheduled on 02/27/19 with Dr. Burr Medico.  I explained that yearly mammograms are important for patients with intact breast tissue, and physical exams are important after mastectomy for patients that cannot undergo mammography.  I encouraged her to call if she had further questions or concerns about her healing. Otherwise, she will follow-up PRN in radiation oncology. Patient is pleased with this plan, and we will cancel her upcoming follow-up to reduce the risk of COVID-19 transmission.

## 2018-12-27 ENCOUNTER — Ambulatory Visit
Admission: RE | Admit: 2018-12-27 | Discharge: 2018-12-27 | Disposition: A | Discharge status: Home / Self Care | Payer: Medicare Other | Source: Ambulatory Visit | Attending: Radiation Oncology | Admitting: Radiation Oncology

## 2019-01-03 NOTE — Progress Notes (Signed)
  Patient Name: Stacy Walls MRN: [id number] DOB: 1932/11/25 Referring Physician: Truitt Merle (Profile Not Attached) Date of Service: 11/25/2018 [facility], [place]                                                        End Of Treatment Note  Diagnoses: C50.111-Malignant neoplasm of central portion of right female breast  Cancer Staging: Cancer of central portion of right female breast [facility]) Staging form: Breast, AJCC 8th Edition - Clinical stage from 03/30/2018: Stage IB (cT1c(m), cN1, cM0, G2, ER+, PR+, HER2-) - Signed by Truitt Merle, MD on 04/08/2018 - Pathologic stage from 06/07/2018: Stage IB (pT2(m), pN2a, cM0, G2, ER+, PR+, HER2-) - Signed by Gardenia Phlegm, NP on 06/22/2018  Intent: Curative  Radiation Treatment Dates: 10/13/2018 through 11/25/2018 Site Technique Total Dose, Gy Dose per Fx Completed Fx Beam Energies  Breast: Breast_Rt 3D 50/50 2 25/25 10X  Breast: Breast_Rt_PAB_SCV 3D 50/50 2 25/25 6X, 15X  Breast: Breast_Rt_Bst 3D 10/10 2 5/5 6X, 10X   Narrative: The patient tolerated radiation therapy relatively well.    Plan: The patient will follow-up with radiation oncology in 1 mo.   -----------------------------------  Eppie Gibson, MD

## 2019-01-09 ENCOUNTER — Inpatient Hospital Stay: Payer: Medicare Other | Attending: Nurse Practitioner

## 2019-01-09 ENCOUNTER — Other Ambulatory Visit: Payer: Self-pay

## 2019-01-09 DIAGNOSIS — D509 Iron deficiency anemia, unspecified: Secondary | ICD-10-CM | POA: Diagnosis not present

## 2019-01-09 DIAGNOSIS — C50111 Malignant neoplasm of central portion of right female breast: Secondary | ICD-10-CM | POA: Diagnosis not present

## 2019-01-09 LAB — CBC WITH DIFFERENTIAL (CANCER CENTER ONLY)
Abs Immature Granulocytes: 0.01 10*3/uL (ref 0.00–0.07)
Basophils Absolute: 0 10*3/uL (ref 0.0–0.1)
Basophils Relative: 1 %
Eosinophils Absolute: 0.2 10*3/uL (ref 0.0–0.5)
Eosinophils Relative: 5 %
HCT: 31.9 % — ABNORMAL LOW (ref 36.0–46.0)
Hemoglobin: 10.6 g/dL — ABNORMAL LOW (ref 12.0–15.0)
Immature Granulocytes: 0 %
Lymphocytes Relative: 22 %
Lymphs Abs: 0.9 10*3/uL (ref 0.7–4.0)
MCH: 31.3 pg (ref 26.0–34.0)
MCHC: 33.2 g/dL (ref 30.0–36.0)
MCV: 94.1 fL (ref 80.0–100.0)
Monocytes Absolute: 0.5 10*3/uL (ref 0.1–1.0)
Monocytes Relative: 13 %
Neutro Abs: 2.5 10*3/uL (ref 1.7–7.7)
Neutrophils Relative %: 59 %
Platelet Count: 119 10*3/uL — ABNORMAL LOW (ref 150–400)
RBC: 3.39 MIL/uL — ABNORMAL LOW (ref 3.87–5.11)
RDW: 12.4 % (ref 11.5–15.5)
WBC Count: 4.2 10*3/uL (ref 4.0–10.5)
nRBC: 0 % (ref 0.0–0.2)

## 2019-01-09 LAB — CMP (CANCER CENTER ONLY)
ALT: 14 U/L (ref 0–44)
AST: 21 U/L (ref 15–41)
Albumin: 3.5 g/dL (ref 3.5–5.0)
Alkaline Phosphatase: 69 U/L (ref 38–126)
Anion gap: 10 (ref 5–15)
BUN: 26 mg/dL — ABNORMAL HIGH (ref 8–23)
CO2: 24 mmol/L (ref 22–32)
Calcium: 9.9 mg/dL (ref 8.9–10.3)
Chloride: 98 mmol/L (ref 98–111)
Creatinine: 1.69 mg/dL — ABNORMAL HIGH (ref 0.44–1.00)
GFR, Est AFR Am: 32 mL/min — ABNORMAL LOW (ref >60)
GFR, Estimated: 28 mL/min — ABNORMAL LOW (ref >60)
Glucose, Bld: 107 mg/dL — ABNORMAL HIGH (ref 70–99)
Potassium: 3.8 mmol/L (ref 3.5–5.1)
Sodium: 132 mmol/L — ABNORMAL LOW (ref 135–145)
Total Bilirubin: 0.5 mg/dL (ref 0.3–1.2)
Total Protein: 6.3 g/dL — ABNORMAL LOW (ref 6.5–8.1)

## 2019-01-09 LAB — IRON AND TIBC
Iron: 49 ug/dL (ref 41–142)
Saturation Ratios: 29 % (ref 21–57)
TIBC: 169 ug/dL — ABNORMAL LOW (ref 236–444)
UIBC: 120 ug/dL (ref 120–384)

## 2019-01-09 LAB — FERRITIN: Ferritin: 1205 ng/mL — ABNORMAL HIGH (ref 11–307)

## 2019-01-10 ENCOUNTER — Telehealth: Payer: Self-pay | Admitting: *Deleted

## 2019-01-10 NOTE — Telephone Encounter (Signed)
-----   Message from Truitt Merle, MD sent at 01/10/2019  2:10 PM EDT ----- Please let pt know her lab results, anemia slightly improved, Cr stable/slightly better, iron study normal, no other concerns, thanks   Truitt Merle  01/10/2019

## 2019-01-10 NOTE — Telephone Encounter (Signed)
TCT patient regarding recent lab rsults.  Spoke with patient and informed her that anemia has improved slightly, kidney function is stable to slightly better and that her iron studies were normal. She voiced understanding. No questions or concerns  She is aware of her f/u appts.

## 2019-01-12 ENCOUNTER — Other Ambulatory Visit: Payer: Self-pay

## 2019-01-12 ENCOUNTER — Telehealth: Payer: Self-pay

## 2019-01-12 DIAGNOSIS — C50111 Malignant neoplasm of central portion of right female breast: Secondary | ICD-10-CM

## 2019-01-12 DIAGNOSIS — Z170 Estrogen receptor positive status [ER+]: Secondary | ICD-10-CM

## 2019-01-12 MED ORDER — TAMOXIFEN CITRATE 20 MG PO TABS: ORAL_TABLET | ORAL | 3 refills | Status: DC

## 2019-01-12 NOTE — Telephone Encounter (Signed)
Patient left a voicemail saying she was changing pharmacies to [facility] and that they would need a new prescription for her Tamoxifen before they could fill it. [facility] added to patient's preferred pharmacies and prescription for tamoxifen sent electronically.   Called patient back and updated her on above information. Patient verbalized understanding and agreement, and denied any further needs at this time

## 2019-02-24 NOTE — Progress Notes (Signed)
[name]   Telephone:([phone]) [phone] Fax:(336) [phone]   Clinic Follow up Note   Patient Care Team: Sonia Side., FNP as PCP - General (Family Medicine) Jerline Pain, MD as PCP - Cardiology (Cardiology) Edrick Oh, MD (Nephrology) Maia Breslow, MD (Orthopedic Surgery) Newton Pigg, MD as Consulting Physician (Obstetrics and Gynecology) Truitt Merle, MD as Consulting Physician (Hematology) Fanny Skates, MD as Consulting Physician (General Surgery)  Date of Service:  02/27/2019  CHIEF COMPLAINT: F/u of right breast cancer  SUMMARY OF ONCOLOGIC HISTORY: Oncology History Overview Note  Cancer Staging Cancer of central portion of right female breast [facility]) Staging form: Breast, AJCC 8th Edition - Clinical stage from 03/30/2018: Stage IB (cT1c(m), cN1, cM0, G2, ER+, PR+, HER2-) - Signed by Truitt Merle, MD on 04/08/2018 - Pathologic stage from 06/07/2018: Stage IB (pT2(m), pN2a, cM0, G2, ER+, PR+, HER2-) - Signed by Gardenia Phlegm, NP on 06/22/2018     Cancer of central portion of right female breast ([place])  01/13/2018 Mammogram   Diagnostic Mammogram 01/13/18  RECOMMENDATION: 1. Ultrasound-guided biopsies of both masses in the RIGHT breast identified by ultrasound at the 12 o'clock axis (7 cm from the nipple measuring 1.1 x 0.8 x 0.7 cm and 5 cm from the nipple measuring 1 x 0.8 x 0.9 cm respectively). 2. Ultrasound-guided biopsy of the most prominent lymph node in the RIGHT axilla. 3. Postprocedure mammogram to ensure that the masses correspond to the extent of the asymmetry/distortion seen on mammogram. Additional stereotactic biopsy may be needed if the clips do not approximate the anterior and posterior extents. 4. Depending on the location of the post biopsy clips, and if breast conservation surgery is considered, stereotactic biopsy may be also needed for the coarse heterogeneous calcifications within the slightly outer RIGHT breast, measuring  8 mm extent, located 1.2 cm from the asymmetry/distortion.   03/30/2018 Cancer Staging   Staging form: Breast, AJCC 8th Edition - Clinical stage from 03/30/2018: Stage IB (cT1c(m), cN1, cM0, G2, ER+, PR+, HER2-) - Signed by Truitt Merle, MD on 04/08/2018   03/30/2018 Initial Biopsy   Diagnosis 03/30/18 1. Breast, right, needle core biopsy, 12 o'clock position, 5cm from nipple - INVASIVE LOBULAR CARCINOMA, GRADE 2. SEE NOTE. - LOBULAR CARCINOMA IN SITU, INTERMEDIATE NUCLEAR GRADE. 2. Breast, right, needle core biopsy, 12 o'clock position 7cfn - INVASIVE LOBULAR CARCINOMA, GRADE 2. SEE NOTE. 1 of 3 FINAL for [name], [name] ([id number]) Diagnosis(continued) - LOBULAR CARCINOMA IN SITU, INTERMEDIATE NUCLEAR GRADE. 3. Lymph node, needle/core biopsy, right axilla - METASTATIC LOBULAR CARCINOMA TO LYMPH NODE.   03/30/2018 Receptors her2   Results: IMMUNOHISTOCHEMICAL AND MORPHOMETRIC ANALYSIS PERFORMED MANUALLY The tumor cells are NEGATIVE for Her2 (1+). Estrogen Receptor: 100%, POSITIVE, STRONG STAINING INTENSITY Progesterone Receptor: 70%, POSITIVE, STRONG STAINING INTENSITY Proliferation Marker Ki67: 10%   04/08/2018 Initial Diagnosis   Cancer of central portion of right female breast ([place])   04/27/2018 Imaging   CT CAP W Contrast 04/27/18  IMPRESSION: 1. Right axillary and right subpectoral lymphadenopathy, suspicious for metastatic disease. PET-CT may prove helpful to further evaluate. 2. Cholelithiasis. 3. Colonic diverticulosis without diverticulitis. 4.  Aortic Atherosclerois (ICD10-170.0)   04/28/2018 Imaging   Bone Scan 04/28/18  IMPRESSION: No scintigraphic evidence skeletal metastasis. Severe degenerative uptake in the medial compartment RIGHT knee.   06/07/2018 Cancer Staging   Staging form: Breast, AJCC 8th Edition - Pathologic stage from 06/07/2018: Stage IB (pT2(m), pN2a, cM0, G2, ER+, PR+, HER2-) - Signed by Gardenia Phlegm, NP  on 06/22/2018   06/07/2018  Surgery   RIGHT BREAST LUMPECTOMY WITH BRACKETED RADIOACTIVE SEEDS AND RIGHT COMPLETE AXILLARY LYMPH NODE DISSECTION by Dr. Dalbert Batman 06/07/18    06/07/2018 Pathology Results   Diagnosis 06/07/18 1. Breast, lumpectomy, right with radioactive seeds - INVASIVE LOBULAR CARCINOMA, MULTIFOCAL, LARGER FOCUS IS 3 CM, NOTTINGHAM GRADE 2 OF 3. - MARGINS OF RESECTION ARE NOT INVOLVED (CLOSEST MARGIN: LESS THAN 1 MM, ANTERIOR). - BIOPSY SITE CHANGES. - SEE ONCOLOGY TABLE. 2. Lymph nodes, regional resection, right axillary contents - METASTATIC CARCINOMA PRESENT IN SEVEN OF FOURTEEN LYMPH NODES (7/14). 3. Breast, excision, inferior margin - BREAST PARENCHYMA, NEGATIVE FOR CARCINOMA.   06/2018 -  Anti-estrogen oral therapy   Tamoxifen 42m daily starting in March or April 2020.    10/13/2018 - 11/25/2018 Radiation Therapy   Adjuvant radiation to right breat per Dr. SIsidore Moos     CURRENT THERAPY:  Tamoxifen 25mdaily starting in March or April 2020.   INTERVAL HISTORY:  Stacy Walls here for a follow up after radiation. She presents to the clinic alone. Her son and daughter were called to be included in the visit. She notes she is doing well. She notes she is taking Tamoxifen still. She denies hot flashes, mood swings. She is tolerating well. She notes she has been trying to lose weight and have been successful. She notes during the day she can cook for herself and clean around the house occasionally. She lives with her 2123ear old grandson. She sees new PCP Dr. FrDustin Folks She notes having white dots on her lower right legs, no pain or itching. She denies recent rash.    REVIEW OF SYSTEMS:   Constitutional: Denies fevers, chills (+) Purposeful weight loss Eyes: Denies blurriness of vision Ears, nose, mouth, throat, and face: Denies mucositis or sore throat Respiratory: Denies cough, dyspnea or wheezes Cardiovascular: Denies palpitation, chest discomfort or lower extremity swelling  Gastrointestinal:  Denies nausea, heartburn or change in bowel habits Skin: Denies abnormal skin rashes (+) Small white dots on left arm and right lower leg Lymphatics: Denies new lymphadenopathy or easy bruising Neurological:Denies numbness, tingling or new weaknesses Behavioral/Psych: Mood is stable, no new changes  All other systems were reviewed with the patient and are negative.  MEDICAL HISTORY:  Past Medical History:  Diagnosis Date  . Acute cholecystitis   . Allergic rhinitis   . Anemia   . Arthritis   . [facility]   [facility]  . Chest pain   . Chronic renal insufficiency    followed by Dr WeJustin Mendtage 3  . Clostridium difficile colitis   . Constipation   . Coronary atherosclerosis of native coronary vessel   . Cough   . Depression    situational - husband died 04/21/08/2020. Diabetes mellitus   . Dizziness   . [place]  . Dyshidrosis   . Family history of adverse reaction to anesthesia    daughter - nausea  . GERD (gastroesophageal reflux disease)   . Gout   . HLD (hyperlipidemia)   . HTN (hypertension)   . Hypothyroidism   . Neck mass   . Neck pain   . Osteoporosis   . Routine general medical examination at a health care facility   . Secondary hyperparathyroidism (of renal origin)   . Urinary incontinence   . Urinary tract infection     SURGICAL HISTORY: Past Surgical History:  Procedure Laterality Date  .  bone density  08/21/05  . BREAST LUMPECTOMY WITH RADIOACTIVE SEED AND AXILLARY LYMPH NODE DISSECTION Right 06/07/2018   Procedure: RIGHT BREAST LUMPECTOMY WITH BRACKETED RADIOACTIVE SEEDS AND RIGHT COMPLETE AXILLARY LYMPH NODE DISSECTION;  Surgeon: Fanny Skates, MD;  Location: [place];  Service: General;  Laterality: Right;  . C-section (other)  1977  . CHOLECYSTECTOMY    . ELECTROCARDIOGRAM  02/01/06  . L ankle surgery Left 1988  . total left knee Left 2007    I have reviewed the social history and family history with  the patient and they are unchanged from previous note.  ALLERGIES:  is allergic to ace inhibitors; azithromycin; pioglitazone; sulfonamide derivatives; and tramadol.  MEDICATIONS:  Current Outpatient Medications  Medication Sig Dispense Refill  . allopurinol (ZYLOPRIM) 300 MG tablet Take 1 tablet (300 mg total) by mouth daily. 90 tablet 3  . ALPRAZolam (XANAX) 0.25 MG tablet Take 0.25 mg by mouth 3 (three) times daily as needed for anxiety.    Marland Kitchen amLODipine (NORVASC) 5 MG tablet Take 5 mg by mouth daily.    Marland Kitchen aspirin EC 81 MG tablet Take 81 mg by mouth daily.    Marland Kitchen atorvastatin (LIPITOR) 20 MG tablet Take 20 mg by mouth daily.    . cetirizine (ZYRTEC) 10 MG tablet     . diclofenac sodium (VOLTAREN) 1 % GEL APPLY 2 GRAMS EXTERNALLY TO THE AFFECTED AREA FOUR TIMES DAILY 100 g 0  . epoetin alfa (PROCRIT) 16384 UNIT/ML injection Inject 10,000 Units into the skin every 30 (thirty) days. Dr. Justin Mend    . escitalopram (LEXAPRO) 10 MG tablet Take 10 mg by mouth daily. Patient taking it as needed when depressed.    . Ferrous Sulfate (IRON) 325 (65 FE) MG TABS Take 325 mg by mouth daily.     . fluticasone (FLONASE) 50 MCG/ACT nasal spray Place 2 sprays into both nostrils daily as needed.     . furosemide (LASIX) 20 MG tablet Take 1 tablet (20 mg total) by mouth daily. 90 tablet 3  . HYDROcodone-acetaminophen (NORCO) 5-325 MG tablet Take 1-2 tablets by mouth every 6 (six) hours as needed for moderate pain or severe pain. 20 tablet 0  . hydrOXYzine (VISTARIL) 25 MG capsule Take 25 mg by mouth at bedtime as needed for anxiety.     Marland Kitchen levothyroxine (SYNTHROID, LEVOTHROID) 88 MCG tablet Take 1 tablet (88 mcg total) by mouth daily. 90 tablet 3  . losartan-hydrochlorothiazide (HYZAAR) 100-25 MG tablet Take 1 tablet by mouth daily. 90 tablet 3  . magnesium hydroxide (MILK OF MAGNESIA) 400 MG/5ML suspension Take 15 mLs by mouth daily as needed for mild constipation.    . Multiple Vitamins-Minerals (MULTIVITAMIN WOMEN  50+ PO) Take 1 tablet by mouth daily.     Marland Kitchen omeprazole (PRILOSEC) 20 MG capsule TAKE 1 CAPSULE(20 MG) BY MOUTH DAILY 90 capsule 0  . oxybutynin (DITROPAN) 5 MG tablet 1 tab daily (Patient taking differently: Take 5 mg by mouth daily. ) 90 tablet 3  . Polyethyl Glycol-Propyl Glycol (SYSTANE) 0.4-0.3 % SOLN Place 1 drop into both eyes daily as needed (for dry eyes).    . sitaGLIPtin (JANUVIA) 100 MG tablet TAKE 1/2 TABLET(50 MG) BY MOUTH DAILY 15 tablet 0  . tamoxifen (NOLVADEX) 20 MG tablet TAKE 1 TABLET(20 MG) BY MOUTH DAILY 90 tablet 3  . triamcinolone ointment (KENALOG) 0.1 % APPLY TOPICALLY TO THE AFFECTED AREA THREE TIMES DAILY AS NEEDED FOR ITCHING (Patient taking differently: Apply 1 application topically 3 (three) times  daily as needed (for itching). ) 454 g 0  . vitamin C (ASCORBIC ACID) 500 MG tablet Take 500 mg by mouth 2 (two) times a week.      No current facility-administered medications for this visit.     PHYSICAL EXAMINATION: ECOG PERFORMANCE STATUS: 2  Vitals:   02/27/19 0946  BP: (!) 156/61  Pulse: 75  Resp: 17  Temp: 98.9 F (37.2 C)  SpO2: 96%   Filed Weights   02/27/19 0946  Weight: 241 lb 14.4 oz (109.7 kg)    GENERAL:alert, no distress and comfortable SKIN: skin color, texture, turgor are normal, no rashes or significant lesions EYES: normal, Conjunctiva are pink and non-injected, sclera clear  NECK: supple, thyroid normal size, non-tender, without nodularity LYMPH:  no palpable lymphadenopathy in the cervical, axillary  LUNGS: clear to auscultation and percussion with normal breathing effort HEART: regular rate & rhythm and no murmurs and no lower extremity edema ABDOMEN:abdomen soft, non-tender and normal bowel sounds Musculoskeletal:no cyanosis of digits and no clubbing  NEURO: alert & oriented x 3 with fluent speech, no focal motor/sensory deficits BREAST: S/p right lumpectomy: Surgical incision healed well. (+) Right breast skin hyperpigmentation  from RT. No palpable mass, nodules or adenopathy bilaterally. Breast exam benign.  Exam performed in wheelchair today  LABORATORY DATA:  I have reviewed the data as listed CBC Latest Ref Rng & Units 02/27/2019 01/09/2019 11/28/2018  WBC 4.0 - 10.5 K/uL 4.4 4.2 4.0  Hemoglobin 12.0 - 15.0 g/dL 10.3(L) 10.6(L) 9.8(L)  Hematocrit 36.0 - 46.0 % 30.3(L) 31.9(L) 30.0(L)  Platelets 150 - 400 K/uL 121(L) 119(L) 100(L)     CMP Latest Ref Rng & Units 02/27/2019 01/09/2019 11/28/2018  Glucose 70 - 99 mg/dL 144(H) 107(H) 102(H)  BUN 8 - 23 mg/dL 28(H) 26(H) 29(H)  Creatinine 0.44 - 1.00 mg/dL 1.68(H) 1.69(H) 1.83(H)  Sodium 135 - 145 mmol/L 127(L) 132(L) 130(L)  Potassium 3.5 - 5.1 mmol/L 4.1 3.8 4.2  Chloride 98 - 111 mmol/L 92(L) 98 98  CO2 22 - 32 mmol/L 26 24 22   Calcium 8.9 - 10.3 mg/dL 10.2 9.9 9.8  Total Protein 6.5 - 8.1 g/dL 6.4(L) 6.3(L) 6.0(L)  Total Bilirubin 0.3 - 1.2 mg/dL 0.7 0.5 0.5  Alkaline Phos 38 - 126 U/L 61 69 61  AST 15 - 41 U/L 22 21 22   ALT 0 - 44 U/L 13 14 15       RADIOGRAPHIC STUDIES: I have personally reviewed the radiological images as listed and agreed with the findings in the report. No results found.   ASSESSMENT & PLAN:  Stacy Walls is a 85 y.o. female with   1. Cancer of central right breast,invasive carcinoma,stageIB, pT2c(m)N2aM0, ER+/PR+, HER2-, GradeII -She was diagnosed in 03/2018. She underwent right breast lumpectomy and axillary lymph node dissection on 06/07/18. Her tumor was completely resected, negative margins, but she had 7/14 positive lymph nodes.  -Given her multiple positive LN she has high risk of recurrence. Standard treatment is adjuvant chemotherapy, but given her age, medical comorbidities, and lobular histology which is less sensitive to chemotherapy, I do not recommend chemotherapy. Patient agrees with the plan -She underwent breast radiation in 11/2018.  -She started Tamoxifen in March or April of 2020. She is tolerating  well with no major side effects. I encouraged her to be active most of the day given her risk of blood clots and to watch for vaginal bleeding while on Tamoxifen.  -From a cancer standpoint she is clinically doing well.  Lab reviewed, her CBC and CMP are within normal limits except mild anemia, thrombocytopaenia, hyponatremia, stable CKD. Her physical exam was unremarkable. There is no clinical concern for recurrence. -Continue surveillance. Next mammogram in 02/2019 -Continue Tamoxifen  -F/u in 6 months.   2. Iron deficient anemiaand anemia of chronic disease secondary to CKD -She was previously being treated with monthly EPO injections by Dr. Justin Mend which was stopped due to possible tumor growth.  -She has not had IV iron before. On oral iron pill daily, continue -Stable anemia.   3. CKD, stage III, HTN, DM -On Lasix, Hyzaar, Omeprazole, Januvia -Managed by PCP, nephrologist and cardiologist.  -Stable.   4. Osteoporosis, OA  -Seen on DEXA from 03/2014 with lowest T-score of -2.6 in femoral neck. -12/08/18 DEXA shows T-score -3/1 at right femur neck.  -She is on Tamoxifen which can help her bone density. She will continue Vit D.  -She ambulates with cane  5. Chronic hyponatremia -Has slightly decreased recently, at 127 today (02/27/19) -She will continue to f/u with her PCP.   PLAN:  -Continue Tamoxifen  -Mammogram on 03/09/19 -Lab and f/u in 6 months  -I spoke with her son and daughter during her visit    No problem-specific Assessment & Plan notes found for this encounter.   No orders of the defined types were placed in this encounter.  All questions were answered. The patient knows to call the clinic with any problems, questions or concerns. No barriers to learning was detected. I spent 15 minutes counseling the patient face to face. The total time spent in the appointment was 20 minutes and more than 50% was on counseling and review of test results     Truitt Merle, MD  02/27/2019   I, Joslyn Devon, am acting as scribe for Truitt Merle, MD.   I have reviewed the above documentation for accuracy and completeness, and I agree with the above.

## 2019-02-27 ENCOUNTER — Other Ambulatory Visit: Payer: Self-pay

## 2019-02-27 ENCOUNTER — Telehealth: Payer: Self-pay | Admitting: Hematology

## 2019-02-27 ENCOUNTER — Inpatient Hospital Stay: Payer: Medicare Other

## 2019-02-27 ENCOUNTER — Inpatient Hospital Stay: Payer: Medicare Other | Attending: Nurse Practitioner | Admitting: Hematology

## 2019-02-27 VITALS — BP 156/61 | HR 75 | Temp 98.9°F | Resp 17 | Ht 62.0 in | Wt 241.9 lb

## 2019-02-27 DIAGNOSIS — N2581 Secondary hyperparathyroidism of renal origin: Secondary | ICD-10-CM | POA: Diagnosis not present

## 2019-02-27 DIAGNOSIS — C50111 Malignant neoplasm of central portion of right female breast: Secondary | ICD-10-CM | POA: Diagnosis not present

## 2019-02-27 DIAGNOSIS — Z170 Estrogen receptor positive status [ER+]: Secondary | ICD-10-CM | POA: Diagnosis not present

## 2019-02-27 DIAGNOSIS — N189 Chronic kidney disease, unspecified: Secondary | ICD-10-CM | POA: Diagnosis not present

## 2019-02-27 DIAGNOSIS — E785 Hyperlipidemia, unspecified: Secondary | ICD-10-CM | POA: Insufficient documentation

## 2019-02-27 DIAGNOSIS — Z79810 Long term (current) use of selective estrogen receptor modulators (SERMs): Secondary | ICD-10-CM | POA: Diagnosis not present

## 2019-02-27 DIAGNOSIS — Z791 Long term (current) use of non-steroidal anti-inflammatories (NSAID): Secondary | ICD-10-CM | POA: Diagnosis not present

## 2019-02-27 DIAGNOSIS — Z923 Personal history of irradiation: Secondary | ICD-10-CM | POA: Insufficient documentation

## 2019-02-27 DIAGNOSIS — Z7982 Long term (current) use of aspirin: Secondary | ICD-10-CM | POA: Insufficient documentation

## 2019-02-27 DIAGNOSIS — K219 Gastro-esophageal reflux disease without esophagitis: Secondary | ICD-10-CM | POA: Diagnosis not present

## 2019-02-27 DIAGNOSIS — F329 Major depressive disorder, single episode, unspecified: Secondary | ICD-10-CM | POA: Diagnosis not present

## 2019-02-27 DIAGNOSIS — I129 Hypertensive chronic kidney disease with stage 1 through stage 4 chronic kidney disease, or unspecified chronic kidney disease: Secondary | ICD-10-CM | POA: Insufficient documentation

## 2019-02-27 DIAGNOSIS — M810 Age-related osteoporosis without current pathological fracture: Secondary | ICD-10-CM | POA: Insufficient documentation

## 2019-02-27 DIAGNOSIS — E871 Hypo-osmolality and hyponatremia: Secondary | ICD-10-CM | POA: Diagnosis not present

## 2019-02-27 DIAGNOSIS — E1122 Type 2 diabetes mellitus with diabetic chronic kidney disease: Secondary | ICD-10-CM | POA: Diagnosis not present

## 2019-02-27 DIAGNOSIS — Z7984 Long term (current) use of oral hypoglycemic drugs: Secondary | ICD-10-CM | POA: Insufficient documentation

## 2019-02-27 DIAGNOSIS — M109 Gout, unspecified: Secondary | ICD-10-CM | POA: Diagnosis not present

## 2019-02-27 DIAGNOSIS — Z79899 Other long term (current) drug therapy: Secondary | ICD-10-CM | POA: Insufficient documentation

## 2019-02-27 DIAGNOSIS — C773 Secondary and unspecified malignant neoplasm of axilla and upper limb lymph nodes: Secondary | ICD-10-CM | POA: Diagnosis present

## 2019-02-27 DIAGNOSIS — M1990 Unspecified osteoarthritis, unspecified site: Secondary | ICD-10-CM | POA: Diagnosis not present

## 2019-02-27 DIAGNOSIS — I2510 Atherosclerotic heart disease of native coronary artery without angina pectoris: Secondary | ICD-10-CM | POA: Diagnosis not present

## 2019-02-27 DIAGNOSIS — E039 Hypothyroidism, unspecified: Secondary | ICD-10-CM | POA: Insufficient documentation

## 2019-02-27 LAB — CBC WITH DIFFERENTIAL (CANCER CENTER ONLY)
Abs Immature Granulocytes: 0.02 10*3/uL (ref 0.00–0.07)
Basophils Absolute: 0 10*3/uL (ref 0.0–0.1)
Basophils Relative: 1 %
Eosinophils Absolute: 0.1 10*3/uL (ref 0.0–0.5)
Eosinophils Relative: 2 %
HCT: 30.3 % — ABNORMAL LOW (ref 36.0–46.0)
Hemoglobin: 10.3 g/dL — ABNORMAL LOW (ref 12.0–15.0)
Immature Granulocytes: 1 %
Lymphocytes Relative: 22 %
Lymphs Abs: 1 10*3/uL (ref 0.7–4.0)
MCH: 30.6 pg (ref 26.0–34.0)
MCHC: 34 g/dL (ref 30.0–36.0)
MCV: 89.9 fL (ref 80.0–100.0)
Monocytes Absolute: 0.5 10*3/uL (ref 0.1–1.0)
Monocytes Relative: 10 %
Neutro Abs: 2.9 10*3/uL (ref 1.7–7.7)
Neutrophils Relative %: 64 %
Platelet Count: 121 10*3/uL — ABNORMAL LOW (ref 150–400)
RBC: 3.37 MIL/uL — ABNORMAL LOW (ref 3.87–5.11)
RDW: 11.9 % (ref 11.5–15.5)
WBC Count: 4.4 10*3/uL (ref 4.0–10.5)
nRBC: 0 % (ref 0.0–0.2)

## 2019-02-27 LAB — CMP (CANCER CENTER ONLY)
ALT: 13 U/L (ref 0–44)
AST: 22 U/L (ref 15–41)
Albumin: 3.7 g/dL (ref 3.5–5.0)
Alkaline Phosphatase: 61 U/L (ref 38–126)
Anion gap: 9 (ref 5–15)
BUN: 28 mg/dL — ABNORMAL HIGH (ref 8–23)
CO2: 26 mmol/L (ref 22–32)
Calcium: 10.2 mg/dL (ref 8.9–10.3)
Chloride: 92 mmol/L — ABNORMAL LOW (ref 98–111)
Creatinine: 1.68 mg/dL — ABNORMAL HIGH (ref 0.44–1.00)
GFR, Est AFR Am: 32 mL/min — ABNORMAL LOW (ref >60)
GFR, Estimated: 28 mL/min — ABNORMAL LOW (ref >60)
Glucose, Bld: 144 mg/dL — ABNORMAL HIGH (ref 70–99)
Potassium: 4.1 mmol/L (ref 3.5–5.1)
Sodium: 127 mmol/L — ABNORMAL LOW (ref 135–145)
Total Bilirubin: 0.7 mg/dL (ref 0.3–1.2)
Total Protein: 6.4 g/dL — ABNORMAL LOW (ref 6.5–8.1)

## 2019-02-27 NOTE — Telephone Encounter (Signed)
Scheduled appt per 11/9 los.  Sent a staff message to get a calendar mailed out.

## 2019-02-28 ENCOUNTER — Encounter: Payer: Self-pay | Admitting: Hematology

## 2019-02-28 LAB — CANCER ANTIGEN 27.29: CA 27.29: 35.4 U/mL (ref 0.0–38.6)

## 2019-03-01 ENCOUNTER — Other Ambulatory Visit: Payer: Self-pay | Admitting: Family

## 2019-03-01 DIAGNOSIS — N184 Chronic kidney disease, stage 4 (severe): Secondary | ICD-10-CM

## 2019-03-17 ENCOUNTER — Inpatient Hospital Stay (HOSPITAL_COMMUNITY)
Admission: EM | Admit: 2019-03-17 | Discharge: 2019-03-27 | DRG: 640 | Disposition: A | Discharge status: Rehabilitation Facility | Payer: Medicare Other | Attending: Internal Medicine | Admitting: Internal Medicine

## 2019-03-17 ENCOUNTER — Encounter (HOSPITAL_COMMUNITY): Payer: Self-pay

## 2019-03-17 DIAGNOSIS — F05 Delirium due to known physiological condition: Secondary | ICD-10-CM | POA: Diagnosis not present

## 2019-03-17 DIAGNOSIS — D696 Thrombocytopenia, unspecified: Secondary | ICD-10-CM | POA: Diagnosis present

## 2019-03-17 DIAGNOSIS — K219 Gastro-esophageal reflux disease without esophagitis: Secondary | ICD-10-CM | POA: Diagnosis present

## 2019-03-17 DIAGNOSIS — R5381 Other malaise: Secondary | ICD-10-CM | POA: Diagnosis present

## 2019-03-17 DIAGNOSIS — M109 Gout, unspecified: Secondary | ICD-10-CM | POA: Diagnosis present

## 2019-03-17 DIAGNOSIS — Z20828 Contact with and (suspected) exposure to other viral communicable diseases: Secondary | ICD-10-CM | POA: Diagnosis present

## 2019-03-17 DIAGNOSIS — R0600 Dyspnea, unspecified: Secondary | ICD-10-CM

## 2019-03-17 DIAGNOSIS — Z6841 Body Mass Index (BMI) 40.0 and over, adult: Secondary | ICD-10-CM

## 2019-03-17 DIAGNOSIS — T502X5A Adverse effect of carbonic-anhydrase inhibitors, benzothiadiazides and other diuretics, initial encounter: Secondary | ICD-10-CM | POA: Diagnosis present

## 2019-03-17 DIAGNOSIS — Z79890 Hormone replacement therapy (postmenopausal): Secondary | ICD-10-CM

## 2019-03-17 DIAGNOSIS — C50111 Malignant neoplasm of central portion of right female breast: Secondary | ICD-10-CM | POA: Diagnosis present

## 2019-03-17 DIAGNOSIS — F419 Anxiety disorder, unspecified: Secondary | ICD-10-CM | POA: Diagnosis present

## 2019-03-17 DIAGNOSIS — F329 Major depressive disorder, single episode, unspecified: Secondary | ICD-10-CM | POA: Diagnosis present

## 2019-03-17 DIAGNOSIS — E1122 Type 2 diabetes mellitus with diabetic chronic kidney disease: Secondary | ICD-10-CM | POA: Diagnosis present

## 2019-03-17 DIAGNOSIS — R059 Cough, unspecified: Secondary | ICD-10-CM

## 2019-03-17 DIAGNOSIS — Z800 Family history of malignant neoplasm of digestive organs: Secondary | ICD-10-CM

## 2019-03-17 DIAGNOSIS — Z7982 Long term (current) use of aspirin: Secondary | ICD-10-CM

## 2019-03-17 DIAGNOSIS — I2510 Atherosclerotic heart disease of native coronary artery without angina pectoris: Secondary | ICD-10-CM | POA: Diagnosis present

## 2019-03-17 DIAGNOSIS — G9341 Metabolic encephalopathy: Secondary | ICD-10-CM | POA: Diagnosis not present

## 2019-03-17 DIAGNOSIS — I10 Essential (primary) hypertension: Secondary | ICD-10-CM | POA: Diagnosis present

## 2019-03-17 DIAGNOSIS — I130 Hypertensive heart and chronic kidney disease with heart failure and stage 1 through stage 4 chronic kidney disease, or unspecified chronic kidney disease: Secondary | ICD-10-CM | POA: Diagnosis present

## 2019-03-17 DIAGNOSIS — Z823 Family history of stroke: Secondary | ICD-10-CM

## 2019-03-17 DIAGNOSIS — Z888 Allergy status to other drugs, medicaments and biological substances status: Secondary | ICD-10-CM

## 2019-03-17 DIAGNOSIS — B379 Candidiasis, unspecified: Secondary | ICD-10-CM | POA: Diagnosis not present

## 2019-03-17 DIAGNOSIS — Z923 Personal history of irradiation: Secondary | ICD-10-CM

## 2019-03-17 DIAGNOSIS — Z881 Allergy status to other antibiotic agents status: Secondary | ICD-10-CM

## 2019-03-17 DIAGNOSIS — R05 Cough: Secondary | ICD-10-CM

## 2019-03-17 DIAGNOSIS — E871 Hypo-osmolality and hyponatremia: Principal | ICD-10-CM | POA: Diagnosis present

## 2019-03-17 DIAGNOSIS — N183 Chronic kidney disease, stage 3 unspecified: Secondary | ICD-10-CM | POA: Diagnosis present

## 2019-03-17 DIAGNOSIS — Z87440 Personal history of urinary (tract) infections: Secondary | ICD-10-CM

## 2019-03-17 DIAGNOSIS — E039 Hypothyroidism, unspecified: Secondary | ICD-10-CM | POA: Diagnosis present

## 2019-03-17 DIAGNOSIS — E1129 Type 2 diabetes mellitus with other diabetic kidney complication: Secondary | ICD-10-CM | POA: Diagnosis present

## 2019-03-17 DIAGNOSIS — Z882 Allergy status to sulfonamides status: Secondary | ICD-10-CM

## 2019-03-17 DIAGNOSIS — Z8249 Family history of ischemic heart disease and other diseases of the circulatory system: Secondary | ICD-10-CM

## 2019-03-17 DIAGNOSIS — I5032 Chronic diastolic (congestive) heart failure: Secondary | ICD-10-CM | POA: Diagnosis present

## 2019-03-17 DIAGNOSIS — D631 Anemia in chronic kidney disease: Secondary | ICD-10-CM | POA: Diagnosis present

## 2019-03-17 DIAGNOSIS — K5900 Constipation, unspecified: Secondary | ICD-10-CM | POA: Diagnosis present

## 2019-03-17 DIAGNOSIS — Z833 Family history of diabetes mellitus: Secondary | ICD-10-CM

## 2019-03-17 DIAGNOSIS — M810 Age-related osteoporosis without current pathological fracture: Secondary | ICD-10-CM | POA: Diagnosis present

## 2019-03-17 DIAGNOSIS — R413 Other amnesia: Secondary | ICD-10-CM | POA: Diagnosis present

## 2019-03-17 DIAGNOSIS — E785 Hyperlipidemia, unspecified: Secondary | ICD-10-CM | POA: Diagnosis present

## 2019-03-17 DIAGNOSIS — N179 Acute kidney failure, unspecified: Secondary | ICD-10-CM | POA: Diagnosis present

## 2019-03-17 DIAGNOSIS — I493 Ventricular premature depolarization: Secondary | ICD-10-CM | POA: Diagnosis present

## 2019-03-17 DIAGNOSIS — N2581 Secondary hyperparathyroidism of renal origin: Secondary | ICD-10-CM | POA: Diagnosis present

## 2019-03-17 LAB — CBG MONITORING, ED: Glucose-Capillary: 139 mg/dL — ABNORMAL HIGH (ref 70–99)

## 2019-03-17 NOTE — ED Triage Notes (Signed)
Pt BIB [facility] from home. Pt complains of AMS/slight LOC at home per EMS. Taking New antibiotic today for chronic UTI. Edema on legs bilaterally. Pt continues repeating random phrases ("I feel like I'm dying"). No neuro deficits noted.   Also complains of coffee ground BM per family.   Hx of renal disease and breast cancer (no dialysis and R. Arm restricted)  [place] 1 week ago at home and was scheduled for CT Scan but was never completed.   EMS VS: 174/80 CBG: 145 O2: 99% RA Pulse: 82

## 2019-03-18 ENCOUNTER — Observation Stay (HOSPITAL_COMMUNITY): Payer: Medicare Other

## 2019-03-18 ENCOUNTER — Observation Stay (HOSPITAL_BASED_OUTPATIENT_CLINIC_OR_DEPARTMENT_OTHER): Payer: Medicare Other

## 2019-03-18 ENCOUNTER — Encounter (HOSPITAL_COMMUNITY): Payer: Self-pay | Admitting: Family Medicine

## 2019-03-18 ENCOUNTER — Emergency Department (HOSPITAL_COMMUNITY): Payer: Medicare Other

## 2019-03-18 ENCOUNTER — Inpatient Hospital Stay (HOSPITAL_COMMUNITY): Payer: Medicare Other

## 2019-03-18 ENCOUNTER — Other Ambulatory Visit: Payer: Self-pay

## 2019-03-18 DIAGNOSIS — Z881 Allergy status to other antibiotic agents status: Secondary | ICD-10-CM | POA: Diagnosis not present

## 2019-03-18 DIAGNOSIS — J340 Abscess, furuncle and carbuncle of nose: Secondary | ICD-10-CM

## 2019-03-18 DIAGNOSIS — I10 Essential (primary) hypertension: Secondary | ICD-10-CM | POA: Diagnosis not present

## 2019-03-18 DIAGNOSIS — Z20828 Contact with and (suspected) exposure to other viral communicable diseases: Secondary | ICD-10-CM | POA: Diagnosis present

## 2019-03-18 DIAGNOSIS — C50111 Malignant neoplasm of central portion of right female breast: Secondary | ICD-10-CM | POA: Diagnosis present

## 2019-03-18 DIAGNOSIS — K219 Gastro-esophageal reflux disease without esophagitis: Secondary | ICD-10-CM | POA: Diagnosis present

## 2019-03-18 DIAGNOSIS — I2510 Atherosclerotic heart disease of native coronary artery without angina pectoris: Secondary | ICD-10-CM | POA: Diagnosis present

## 2019-03-18 DIAGNOSIS — Z882 Allergy status to sulfonamides status: Secondary | ICD-10-CM | POA: Diagnosis not present

## 2019-03-18 DIAGNOSIS — E1121 Type 2 diabetes mellitus with diabetic nephropathy: Secondary | ICD-10-CM | POA: Diagnosis not present

## 2019-03-18 DIAGNOSIS — F05 Delirium due to known physiological condition: Secondary | ICD-10-CM | POA: Diagnosis not present

## 2019-03-18 DIAGNOSIS — R5381 Other malaise: Secondary | ICD-10-CM | POA: Diagnosis present

## 2019-03-18 DIAGNOSIS — I5032 Chronic diastolic (congestive) heart failure: Secondary | ICD-10-CM | POA: Diagnosis present

## 2019-03-18 DIAGNOSIS — E871 Hypo-osmolality and hyponatremia: Secondary | ICD-10-CM | POA: Diagnosis not present

## 2019-03-18 DIAGNOSIS — E039 Hypothyroidism, unspecified: Secondary | ICD-10-CM

## 2019-03-18 DIAGNOSIS — N183 Chronic kidney disease, stage 3 unspecified: Secondary | ICD-10-CM | POA: Diagnosis present

## 2019-03-18 DIAGNOSIS — E785 Hyperlipidemia, unspecified: Secondary | ICD-10-CM | POA: Diagnosis present

## 2019-03-18 DIAGNOSIS — F419 Anxiety disorder, unspecified: Secondary | ICD-10-CM | POA: Diagnosis present

## 2019-03-18 DIAGNOSIS — Z6841 Body Mass Index (BMI) 40.0 and over, adult: Secondary | ICD-10-CM | POA: Diagnosis not present

## 2019-03-18 DIAGNOSIS — N1832 Chronic kidney disease, stage 3b: Secondary | ICD-10-CM

## 2019-03-18 DIAGNOSIS — Z888 Allergy status to other drugs, medicaments and biological substances status: Secondary | ICD-10-CM | POA: Diagnosis not present

## 2019-03-18 DIAGNOSIS — F329 Major depressive disorder, single episode, unspecified: Secondary | ICD-10-CM | POA: Diagnosis present

## 2019-03-18 DIAGNOSIS — I130 Hypertensive heart and chronic kidney disease with heart failure and stage 1 through stage 4 chronic kidney disease, or unspecified chronic kidney disease: Secondary | ICD-10-CM | POA: Diagnosis present

## 2019-03-18 DIAGNOSIS — N179 Acute kidney failure, unspecified: Secondary | ICD-10-CM | POA: Diagnosis present

## 2019-03-18 DIAGNOSIS — R0602 Shortness of breath: Secondary | ICD-10-CM

## 2019-03-18 DIAGNOSIS — D631 Anemia in chronic kidney disease: Secondary | ICD-10-CM | POA: Diagnosis present

## 2019-03-18 DIAGNOSIS — Z170 Estrogen receptor positive status [ER+]: Secondary | ICD-10-CM

## 2019-03-18 DIAGNOSIS — E1122 Type 2 diabetes mellitus with diabetic chronic kidney disease: Secondary | ICD-10-CM | POA: Diagnosis present

## 2019-03-18 DIAGNOSIS — N2581 Secondary hyperparathyroidism of renal origin: Secondary | ICD-10-CM | POA: Diagnosis present

## 2019-03-18 DIAGNOSIS — G9341 Metabolic encephalopathy: Secondary | ICD-10-CM | POA: Diagnosis not present

## 2019-03-18 LAB — COMPREHENSIVE METABOLIC PANEL
ALT: 16 U/L (ref 0–44)
AST: 29 U/L (ref 15–41)
Albumin: 3.5 g/dL (ref 3.5–5.0)
Alkaline Phosphatase: 47 U/L (ref 38–126)
Anion gap: 15 (ref 5–15)
BUN: 18 mg/dL (ref 8–23)
CO2: 22 mmol/L (ref 22–32)
Calcium: 10.2 mg/dL (ref 8.9–10.3)
Chloride: 87 mmol/L — ABNORMAL LOW (ref 98–111)
Creatinine, Ser: 2.16 mg/dL — ABNORMAL HIGH (ref 0.44–1.00)
GFR calc Af Amer: 24 mL/min — ABNORMAL LOW (ref >60)
GFR calc non Af Amer: 21 mL/min — ABNORMAL LOW (ref >60)
Glucose, Bld: 143 mg/dL — ABNORMAL HIGH (ref 70–99)
Potassium: 3.9 mmol/L (ref 3.5–5.1)
Sodium: 124 mmol/L — ABNORMAL LOW (ref 135–145)
Total Bilirubin: 1.4 mg/dL — ABNORMAL HIGH (ref 0.3–1.2)
Total Protein: 5.8 g/dL — ABNORMAL LOW (ref 6.5–8.1)

## 2019-03-18 LAB — CBC WITH DIFFERENTIAL/PLATELET
Abs Immature Granulocytes: 0.09 10*3/uL — ABNORMAL HIGH (ref 0.00–0.07)
Basophils Absolute: 0 10*3/uL (ref 0.0–0.1)
Basophils Relative: 0 %
Eosinophils Absolute: 0 10*3/uL (ref 0.0–0.5)
Eosinophils Relative: 0 %
HCT: 30.4 % — ABNORMAL LOW (ref 36.0–46.0)
Hemoglobin: 10.5 g/dL — ABNORMAL LOW (ref 12.0–15.0)
Immature Granulocytes: 1 %
Lymphocytes Relative: 6 %
Lymphs Abs: 0.4 10*3/uL — ABNORMAL LOW (ref 0.7–4.0)
MCH: 30.8 pg (ref 26.0–34.0)
MCHC: 34.5 g/dL (ref 30.0–36.0)
MCV: 89.1 fL (ref 80.0–100.0)
Monocytes Absolute: 0.6 10*3/uL (ref 0.1–1.0)
Monocytes Relative: 9 %
Neutro Abs: 5.4 10*3/uL (ref 1.7–7.7)
Neutrophils Relative %: 84 %
Platelets: 128 10*3/uL — ABNORMAL LOW (ref 150–400)
RBC: 3.41 MIL/uL — ABNORMAL LOW (ref 3.87–5.11)
RDW: 12.2 % (ref 11.5–15.5)
WBC: 6.4 10*3/uL (ref 4.0–10.5)
nRBC: 0 % (ref 0.0–0.2)

## 2019-03-18 LAB — BASIC METABOLIC PANEL
Anion gap: 11 (ref 5–15)
Anion gap: 9 (ref 5–15)
Anion gap: 9 (ref 5–15)
BUN: 18 mg/dL (ref 8–23)
BUN: 16 mg/dL (ref 8–23)
BUN: 16 mg/dL (ref 8–23)
CO2: 25 mmol/L (ref 22–32)
CO2: 24 mmol/L (ref 22–32)
CO2: 26 mmol/L (ref 22–32)
Calcium: 9.9 mg/dL (ref 8.9–10.3)
Calcium: 9.6 mg/dL (ref 8.9–10.3)
Calcium: 9.7 mg/dL (ref 8.9–10.3)
Chloride: 88 mmol/L — ABNORMAL LOW (ref 98–111)
Chloride: 89 mmol/L — ABNORMAL LOW (ref 98–111)
Chloride: 89 mmol/L — ABNORMAL LOW (ref 98–111)
Creatinine, Ser: 1.94 mg/dL — ABNORMAL HIGH (ref 0.44–1.00)
Creatinine, Ser: 1.88 mg/dL — ABNORMAL HIGH (ref 0.44–1.00)
Creatinine, Ser: 1.93 mg/dL — ABNORMAL HIGH (ref 0.44–1.00)
GFR calc Af Amer: 27 mL/min — ABNORMAL LOW (ref >60)
GFR calc Af Amer: 28 mL/min — ABNORMAL LOW (ref >60)
GFR calc Af Amer: 27 mL/min — ABNORMAL LOW (ref >60)
GFR calc non Af Amer: 23 mL/min — ABNORMAL LOW (ref >60)
GFR calc non Af Amer: 24 mL/min — ABNORMAL LOW (ref >60)
GFR calc non Af Amer: 24 mL/min — ABNORMAL LOW (ref >60)
Glucose, Bld: 136 mg/dL — ABNORMAL HIGH (ref 70–99)
Glucose, Bld: 109 mg/dL — ABNORMAL HIGH (ref 70–99)
Glucose, Bld: 103 mg/dL — ABNORMAL HIGH (ref 70–99)
Potassium: 4 mmol/L (ref 3.5–5.1)
Potassium: 4 mmol/L (ref 3.5–5.1)
Potassium: 4.3 mmol/L (ref 3.5–5.1)
Sodium: 124 mmol/L — ABNORMAL LOW (ref 135–145)
Sodium: 122 mmol/L — ABNORMAL LOW (ref 135–145)
Sodium: 124 mmol/L — ABNORMAL LOW (ref 135–145)

## 2019-03-18 LAB — URINALYSIS, ROUTINE W REFLEX MICROSCOPIC
Bacteria, UA: NONE SEEN
Bilirubin Urine: NEGATIVE
Glucose, UA: NEGATIVE mg/dL
Hgb urine dipstick: NEGATIVE
Ketones, ur: 5 mg/dL — AB
Leukocytes,Ua: NEGATIVE
Nitrite: NEGATIVE
Protein, ur: >=300 mg/dL — AB
Specific Gravity, Urine: 1.009 (ref 1.005–1.030)
pH: 8 (ref 5.0–8.0)

## 2019-03-18 LAB — ECHOCARDIOGRAM COMPLETE
Height: 62 in
Weight: 3952 oz

## 2019-03-18 LAB — TSH: TSH: 5.807 u[IU]/mL — ABNORMAL HIGH (ref 0.350–4.500)

## 2019-03-18 LAB — GLUCOSE, CAPILLARY
Glucose-Capillary: 133 mg/dL — ABNORMAL HIGH (ref 70–99)
Glucose-Capillary: 96 mg/dL (ref 70–99)
Glucose-Capillary: 112 mg/dL — ABNORMAL HIGH (ref 70–99)
Glucose-Capillary: 106 mg/dL — ABNORMAL HIGH (ref 70–99)

## 2019-03-18 LAB — HEMOGLOBIN A1C
Hgb A1c MFr Bld: 5.2 % (ref 4.8–5.6)
Mean Plasma Glucose: 102.54 mg/dL

## 2019-03-18 LAB — OSMOLALITY, URINE: Osmolality, Ur: 294 mOsm/kg — ABNORMAL LOW (ref 300–900)

## 2019-03-18 LAB — SARS CORONAVIRUS 2 (TAT 6-24 HRS): SARS Coronavirus 2: NEGATIVE

## 2019-03-18 LAB — LIPASE, BLOOD: Lipase: 21 U/L (ref 11–51)

## 2019-03-18 LAB — CREATININE, URINE, RANDOM: Creatinine, Urine: 78.89 mg/dL

## 2019-03-18 LAB — MAGNESIUM: Magnesium: 1.7 mg/dL (ref 1.7–2.4)

## 2019-03-18 LAB — SODIUM, URINE, RANDOM: Sodium, Ur: 43 mmol/L

## 2019-03-18 LAB — T4, FREE: Free T4: 1.41 ng/dL — ABNORMAL HIGH (ref 0.61–1.12)

## 2019-03-18 LAB — TROPONIN I (HIGH SENSITIVITY)
Troponin I (High Sensitivity): 15 ng/L (ref <18)
Troponin I (High Sensitivity): 29 ng/L — ABNORMAL HIGH (ref <18)

## 2019-03-18 LAB — OSMOLALITY: Osmolality: 260 mOsm/kg — ABNORMAL LOW (ref 275–295)

## 2019-03-18 MED ORDER — HYDROCODONE-ACETAMINOPHEN 5-325 MG PO TABS
1.0000 | ORAL_TABLET | Freq: Four times a day (QID) | ORAL | Status: DC | PRN
  Administered 2019-03-19: 1 via ORAL
  Filled 2019-03-18: qty 1

## 2019-03-18 MED ORDER — FLUTICASONE PROPIONATE 50 MCG/ACT NA SUSP
2.0000 | Freq: Every day | NASAL | Status: DC
  Administered 2019-03-18 – 2019-03-27 (×9): 2 via NASAL
  Filled 2019-03-18: qty 16

## 2019-03-18 MED ORDER — SODIUM CHLORIDE 0.9% FLUSH
3.0000 mL | Freq: Two times a day (BID) | INTRAVENOUS | Status: DC
  Administered 2019-03-18 – 2019-03-27 (×15): 3 mL via INTRAVENOUS

## 2019-03-18 MED ORDER — FERROUS SULFATE 325 (65 FE) MG PO TABS
325.0000 mg | ORAL_TABLET | Freq: Every day | ORAL | Status: DC
  Administered 2019-03-18 – 2019-03-27 (×10): 325 mg via ORAL
  Filled 2019-03-18 (×11): qty 1

## 2019-03-18 MED ORDER — ASPIRIN EC 81 MG PO TBEC: 81.0000 mg | DELAYED_RELEASE_TABLET | Freq: Every day | ORAL | Status: DC

## 2019-03-18 MED ORDER — POLYETHYL GLYCOL-PROPYL GLYCOL 0.4-0.3 % OP SOLN: 1.0000 [drp] | Freq: Every day | OPHTHALMIC | Status: DC | PRN

## 2019-03-18 MED ORDER — OXYBUTYNIN CHLORIDE 5 MG PO TABS
5.0000 mg | ORAL_TABLET | Freq: Every day | ORAL | Status: DC
  Administered 2019-03-18 – 2019-03-20 (×3): 5 mg via ORAL
  Filled 2019-03-18 (×3): qty 1

## 2019-03-18 MED ORDER — PANTOPRAZOLE SODIUM 40 MG PO TBEC
40.0000 mg | DELAYED_RELEASE_TABLET | Freq: Every day | ORAL | Status: DC
  Administered 2019-03-18 – 2019-03-27 (×10): 40 mg via ORAL
  Filled 2019-03-18: qty 2
  Filled 2019-03-18 (×7): qty 1
  Filled 2019-03-18: qty 2
  Filled 2019-03-18: qty 1

## 2019-03-18 MED ORDER — LORATADINE 10 MG PO TABS: 10.0000 mg | ORAL_TABLET | Freq: Every day | ORAL | Status: DC

## 2019-03-18 MED ORDER — ONDANSETRON HCL 4 MG/2ML IJ SOLN
4.0000 mg | Freq: Four times a day (QID) | INTRAMUSCULAR | Status: DC | PRN
  Administered 2019-03-18 – 2019-03-20 (×3): 4 mg via INTRAVENOUS
  Filled 2019-03-18 (×3): qty 2

## 2019-03-18 MED ORDER — INSULIN ASPART 100 UNIT/ML ~~LOC~~ SOLN
0.0000 [IU] | Freq: Three times a day (TID) | SUBCUTANEOUS | Status: DC
  Administered 2019-03-18 – 2019-03-21 (×5): 1 [IU] via SUBCUTANEOUS
  Administered 2019-03-22: 2 [IU] via SUBCUTANEOUS
  Administered 2019-03-22 – 2019-03-27 (×6): 1 [IU] via SUBCUTANEOUS

## 2019-03-18 MED ORDER — HYDRALAZINE HCL 50 MG PO TABS
50.0000 mg | ORAL_TABLET | Freq: Two times a day (BID) | ORAL | Status: DC
  Administered 2019-03-18 – 2019-03-27 (×19): 50 mg via ORAL
  Filled 2019-03-18 (×19): qty 1

## 2019-03-18 MED ORDER — ACETAMINOPHEN 325 MG PO TABS
650.0000 mg | ORAL_TABLET | Freq: Four times a day (QID) | ORAL | Status: DC | PRN
  Administered 2019-03-19 – 2019-03-20 (×2): 650 mg via ORAL
  Filled 2019-03-18 (×3): qty 2

## 2019-03-18 MED ORDER — TAMOXIFEN CITRATE 10 MG PO TABS
20.0000 mg | ORAL_TABLET | Freq: Every day | ORAL | Status: DC
  Administered 2019-03-18 – 2019-03-27 (×10): 20 mg via ORAL
  Filled 2019-03-18 (×10): qty 2

## 2019-03-18 MED ORDER — PROMETHAZINE HCL 25 MG/ML IJ SOLN
6.2500 mg | Freq: Once | INTRAMUSCULAR | Status: AC
  Administered 2019-03-18: 6.25 mg via INTRAVENOUS
  Filled 2019-03-18: qty 1

## 2019-03-18 MED ORDER — ATORVASTATIN CALCIUM 10 MG PO TABS
20.0000 mg | ORAL_TABLET | Freq: Every day | ORAL | Status: DC
  Administered 2019-03-18 – 2019-03-20 (×3): 20 mg via ORAL
  Filled 2019-03-18 (×3): qty 2

## 2019-03-18 MED ORDER — FERROUS SULFATE 325 (65 FE) MG PO TABS: 325.0000 mg | ORAL_TABLET | Freq: Every day | ORAL | Status: DC

## 2019-03-18 MED ORDER — ATORVASTATIN CALCIUM 10 MG PO TABS: 20.0000 mg | ORAL_TABLET | Freq: Every day | ORAL | Status: DC

## 2019-03-18 MED ORDER — HEPARIN SODIUM (PORCINE) 5000 UNIT/ML IJ SOLN
5000.0000 [IU] | Freq: Three times a day (TID) | INTRAMUSCULAR | Status: DC
  Administered 2019-03-18 – 2019-03-27 (×29): 5000 [IU] via SUBCUTANEOUS
  Filled 2019-03-18 (×29): qty 1

## 2019-03-18 MED ORDER — LORATADINE 10 MG PO TABS
10.0000 mg | ORAL_TABLET | Freq: Every day | ORAL | Status: DC
  Administered 2019-03-18 – 2019-03-20 (×3): 10 mg via ORAL
  Filled 2019-03-18 (×3): qty 1

## 2019-03-18 MED ORDER — AMLODIPINE BESYLATE 5 MG PO TABS: 5.0000 mg | ORAL_TABLET | Freq: Every day | ORAL | Status: DC

## 2019-03-18 MED ORDER — POLYVINYL ALCOHOL 1.4 % OP SOLN
1.0000 [drp] | OPHTHALMIC | Status: DC | PRN
  Filled 2019-03-18: qty 15

## 2019-03-18 MED ORDER — AMLODIPINE BESYLATE 5 MG PO TABS
5.0000 mg | ORAL_TABLET | Freq: Every day | ORAL | Status: DC
  Administered 2019-03-18 – 2019-03-19 (×2): 5 mg via ORAL
  Filled 2019-03-18 (×3): qty 1

## 2019-03-18 MED ORDER — SODIUM CHLORIDE 0.9 % IV BOLUS
1000.0000 mL | Freq: Once | INTRAVENOUS | Status: AC
  Administered 2019-03-18: 1000 mL via INTRAVENOUS

## 2019-03-18 MED ORDER — LUBIPROSTONE 24 MCG PO CAPS
24.0000 ug | ORAL_CAPSULE | Freq: Two times a day (BID) | ORAL | Status: DC
  Administered 2019-03-18 – 2019-03-20 (×5): 24 ug via ORAL
  Filled 2019-03-18 (×5): qty 1

## 2019-03-18 MED ORDER — POTASSIUM CHLORIDE CRYS ER 10 MEQ PO TBCR
10.0000 meq | EXTENDED_RELEASE_TABLET | Freq: Once | ORAL | Status: AC
  Administered 2019-03-18: 10 meq via ORAL
  Filled 2019-03-18: qty 1

## 2019-03-18 MED ORDER — DICLOFENAC SODIUM 1 % TD GEL
2.0000 g | Freq: Four times a day (QID) | TRANSDERMAL | Status: DC
  Administered 2019-03-19 – 2019-03-27 (×31): 2 g via TOPICAL
  Filled 2019-03-18: qty 100

## 2019-03-18 MED ORDER — ACETAMINOPHEN 650 MG RE SUPP: 650.0000 mg | Freq: Four times a day (QID) | RECTAL | Status: DC | PRN

## 2019-03-18 MED ORDER — ASPIRIN EC 81 MG PO TBEC
81.0000 mg | DELAYED_RELEASE_TABLET | Freq: Every day | ORAL | Status: DC
  Administered 2019-03-18 – 2019-03-27 (×10): 81 mg via ORAL
  Filled 2019-03-18 (×10): qty 1

## 2019-03-18 NOTE — ED Provider Notes (Signed)
[facility] EMERGENCY DEPARTMENT Provider Note   CSN: [id number] Arrival date & time: 03/17/19  2338     History   Chief Complaint Chief Complaint  Patient presents with  . Loss of Consciousness    HPI Stacy Walls is a 85 y.o. female.     HPI  This is an 85 year old female with a history of breast cancer, renal insufficiency, diabetes who presents via EMS with concerns for altered mental status.  Patient is complaining of nausea and dizziness to me.  She reports that she took an increased dose of antibiotic today and started having nausea and upper abdominal pain.  That is her only complaint to me.  She states "I just feel bad."  She is awake, alert, and oriented x3 and able to provide history.  Per EMS, family noted dark bowel movements and some altered mental status.  Additionally she fell 1 week ago and is scheduled for CT scan per report.  Again, patient is only complaining of nausea.  Denies chest pain, shortness of breath, fevers.  12:28 AM With the patient's son, Mortimer Fries.  States that recently they have had issues with her blood pressure.  She was transitioned to amlodipine and HCTZ.  She was having some increased swelling with the amlodipine and dizziness.  She also was recently diagnosed with a UTI but did not finish antibiotics.  She was increased to Levaquin 750 mg and started having nausea.  He is concerned that she may be dehydrated.  Additionally he does state that a couple weeks ago she fell and seems to have declined since.  Past Medical History:  Diagnosis Date  . Acute cholecystitis   . Allergic rhinitis   . Anemia   . Arthritis   . Cancer [facility])    Right breast  . Chest pain   . Chronic renal insufficiency    followed by Dr Justin Mend stage 3  . Clostridium difficile colitis   . Constipation   . Coronary atherosclerosis of native coronary vessel   . Cough   . Depression    situational - husband died 05-17-18  . Diabetes mellitus   .  Dizziness   . DM2 (diabetes mellitus, type 2) ([place])   . Dyshidrosis   . Family history of adverse reaction to anesthesia    daughter - nausea  . GERD (gastroesophageal reflux disease)   . Gout   . HLD (hyperlipidemia)   . HTN (hypertension)   . Hypothyroidism   . Neck mass   . Neck pain   . Osteoporosis   . Routine general medical examination at a health care facility   . Secondary hyperparathyroidism (of renal origin)   . Urinary incontinence   . Urinary tract infection     Patient Active Problem List   Diagnosis Date Noted  . Hyponatremia 03/18/2019  . Acute renal failure superimposed on stage 3 chronic kidney disease ([place]) 03/18/2019  . Cancer of overlapping sites of right female breast ([place]) 06/07/2018  . Cancer of central portion of right female breast ([place]) 04/08/2018  . Cough 10/11/2017  . Abnormal endometrial ultrasound 08/03/2017  . Chronic scapular pain 08/02/2017  . Pelvic pain 07/27/2017  . Headache 05/05/2017  . Osteoarthritis 07/29/2016  . UTI (urinary tract infection) 09/04/2015  . Acute sinus infection 07/31/2015  . Screening for breast cancer 01/31/2013  . Diabetes mellitus with renal manifestation ([place]) 08/24/2012  . Iron deficiency anemia, unspecified 01/27/2012  . Screening examination for infectious disease 01/27/2012  .  Encounter for long-term (current) use of other medications 01/12/2011  . OTITIS EXTERNA 03/04/2010  . Shoulder pain, left 12/02/2009  . Pancytopenia ([place]) 10/09/2009  . CLOSTRIDIUM DIFFICILE COLITIS 08/23/2009  . CAD, NATIVE VESSEL 08/23/2009  . CKD (chronic kidney disease), stage III ([place]) 08/23/2009  . Acute cholecystitis 08/15/2009  . Dyshidrosis 04/08/2009  . DIZZINESS 07/03/2008  . Secondary renal hyperparathyroidism ([place]) 06/15/2007  . Hypothyroidism 04/20/2007  . NECK PAIN 04/20/2007  . NECK MASS 04/05/2007  . GERD 03/09/2007  . Dyslipidemia 09/20/2006  . GOUT 09/20/2006  . Essential hypertension 09/20/2006  .  ALLERGIC RHINITIS 09/20/2006  . Osteoporosis 09/20/2006  . URINARY INCONTINENCE 09/20/2006    Past Surgical History:  Procedure Laterality Date  . bone density  08/21/05  . BREAST LUMPECTOMY WITH RADIOACTIVE SEED AND AXILLARY LYMPH NODE DISSECTION Right 06/07/2018   Procedure: RIGHT BREAST LUMPECTOMY WITH BRACKETED RADIOACTIVE SEEDS AND RIGHT COMPLETE AXILLARY LYMPH NODE DISSECTION;  Surgeon: Fanny Skates, MD;  Location: [place];  Service: General;  Laterality: Right;  . C-section (other)  1977  . CHOLECYSTECTOMY    . ELECTROCARDIOGRAM  02/01/06  . L ankle surgery Left 1988  . total left knee Left 2007     OB History   No obstetric history on file.      Home Medications    Prior to Admission medications   Medication Sig Start Date End Date Taking? Authorizing Provider  allopurinol (ZYLOPRIM) 300 MG tablet Take 1 tablet (300 mg total) by mouth daily. 01/27/18   Renato Shin, MD  ALPRAZolam Duanne Moron) 0.25 MG tablet Take 0.25 mg by mouth 3 (three) times daily as needed for anxiety.    [provider]  amLODipine (NORVASC) 5 MG tablet Take 5 mg by mouth daily.    [provider]  aspirin EC 81 MG tablet Take 81 mg by mouth daily.    [provider]  atorvastatin (LIPITOR) 20 MG tablet Take 20 mg by mouth daily.    [provider]  cetirizine (ZYRTEC) 10 MG tablet  09/29/18   [provider]  diclofenac sodium (VOLTAREN) 1 % GEL APPLY 2 GRAMS EXTERNALLY TO THE AFFECTED AREA FOUR TIMES DAILY 08/17/17   Renato Shin, MD  epoetin alfa (PROCRIT) 50277 UNIT/ML injection Inject 10,000 Units into the skin every 30 (thirty) days. Dr. Justin Mend    [provider]  escitalopram (LEXAPRO) 10 MG tablet Take 10 mg by mouth daily. Patient taking it as needed when depressed.    [provider]  Ferrous Sulfate (IRON) 325 (65 FE) MG TABS Take 325 mg by mouth daily.     [provider]  fluticasone (FLONASE) 50 MCG/ACT nasal spray Place 2  sprays into both nostrils daily as needed.     [provider]  furosemide (LASIX) 20 MG tablet Take 1 tablet (20 mg total) by mouth daily. 01/27/18   Renato Shin, MD  HYDROcodone-acetaminophen (NORCO) 5-325 MG tablet Take 1-2 tablets by mouth every 6 (six) hours as needed for moderate pain or severe pain. 06/08/18   Fanny Skates, MD  hydrOXYzine (VISTARIL) 25 MG capsule Take 25 mg by mouth at bedtime as needed for anxiety.     [provider]  levothyroxine (SYNTHROID, LEVOTHROID) 88 MCG tablet Take 1 tablet (88 mcg total) by mouth daily. 01/27/18   Renato Shin, MD  losartan-hydrochlorothiazide (HYZAAR) 100-25 MG tablet Take 1 tablet by mouth daily. 01/27/18   Renato Shin, MD  magnesium hydroxide (MILK OF MAGNESIA) 400 MG/5ML suspension Take  15 mLs by mouth daily as needed for mild constipation.    [provider]  Multiple Vitamins-Minerals (MULTIVITAMIN WOMEN 50+ PO) Take 1 tablet by mouth daily.     [provider]  omeprazole (PRILOSEC) 20 MG capsule TAKE 1 CAPSULE(20 MG) BY MOUTH DAILY 03/28/18   Renato Shin, MD  oxybutynin (DITROPAN) 5 MG tablet 1 tab daily Patient taking differently: Take 5 mg by mouth daily.  09/07/17   Renato Shin, MD  Polyethyl Glycol-Propyl Glycol (SYSTANE) 0.4-0.3 % SOLN Place 1 drop into both eyes daily as needed (for dry eyes).    [provider]  sitaGLIPtin (JANUVIA) 100 MG tablet TAKE 1/2 TABLET(50 MG) BY MOUTH DAILY 11/21/18   Renato Shin, MD  tamoxifen (NOLVADEX) 20 MG tablet TAKE 1 TABLET(20 MG) BY MOUTH DAILY 01/12/19   Truitt Merle, MD  triamcinolone ointment (KENALOG) 0.1 % APPLY TOPICALLY TO THE AFFECTED AREA THREE TIMES DAILY AS NEEDED FOR ITCHING Patient taking differently: Apply 1 application topically 3 (three) times daily as needed (for itching).  03/06/17   Renato Shin, MD  vitamin C (ASCORBIC ACID) 500 MG tablet Take 500 mg by mouth 2 (two) times a week.     [provider]    Family  History Family History  Problem Relation Age of Onset  . Coronary artery disease Father   . Stroke Father   . Diabetes Father   . Diabetes Sister   . Stroke Sister   . Diabetes Brother   . Cancer Brother        liver caner  . Breast cancer Neg Hx     Social History Social History   Tobacco Use  . Smoking status: Never Smoker  . Smokeless tobacco: Never Used  Substance Use Topics  . Alcohol use: No  . Drug use: No     Allergies   Ace inhibitors, Azithromycin, Pioglitazone, Sulfonamide derivatives, and Tramadol   Review of Systems Review of Systems  Constitutional: Negative for fever.  Respiratory: Negative for shortness of breath.   Cardiovascular: Negative for chest pain.  Gastrointestinal: Positive for nausea. Negative for abdominal pain and vomiting.  Genitourinary: Negative for dysuria.  Skin: Negative for rash.  Neurological: Negative for headaches.  All other systems reviewed and are negative.    Physical Exam Updated Vital Signs BP (!) 145/81   Pulse 98   Temp 97.7 F (36.5 C) (Oral)   Resp (!) 21   SpO2 95%   Physical Exam Vitals signs and nursing note reviewed.  Constitutional:      Appearance: She is well-developed. She is obese.     Comments: Awake, alert, oriented  HENT:     Head: Normocephalic and atraumatic.     Mouth/Throat:     Mouth: Mucous membranes are dry.  Eyes:     Pupils: Pupils are equal, round, and reactive to light.  Neck:     Musculoskeletal: Neck supple.  Cardiovascular:     Rate and Rhythm: Normal rate and regular rhythm.     Heart sounds: Normal heart sounds.  Pulmonary:     Effort: Pulmonary effort is normal. No respiratory distress.     Breath sounds: No wheezing.  Abdominal:     General: Bowel sounds are normal.     Palpations: Abdomen is soft.     Tenderness: There is no abdominal tenderness. There is no guarding or rebound.  Musculoskeletal:     Right lower leg: Edema present.     Left lower leg: Edema  present.     Comments: Right upper extremity swelling  Skin:    General: Skin is warm and dry.  Neurological:     Mental Status: She is alert and oriented to person, place, and time.  Psychiatric:        Mood and Affect: Mood normal.      ED Treatments / Results  Labs (all labs ordered are listed, but only abnormal results are displayed) Labs Reviewed  CBC WITH DIFFERENTIAL/PLATELET - Abnormal; Notable for the following components:      Result Value   RBC 3.41 (*)    Hemoglobin 10.5 (*)    HCT 30.4 (*)    Platelets 128 (*)    Lymphs Abs 0.4 (*)    Abs Immature Granulocytes 0.09 (*)    All other components within normal limits  COMPREHENSIVE METABOLIC PANEL - Abnormal; Notable for the following components:   Sodium 124 (*)    Chloride 87 (*)    Glucose, Bld 143 (*)    Creatinine, Ser 2.16 (*)    Total Protein 5.8 (*)    Total Bilirubin 1.4 (*)    GFR calc non Af Amer 21 (*)    GFR calc Af Amer 24 (*)    All other components within normal limits  URINALYSIS, ROUTINE W REFLEX MICROSCOPIC - Abnormal; Notable for the following components:   Ketones, ur 5 (*)    Protein, ur >=300 (*)    All other components within normal limits  CBG MONITORING, ED - Abnormal; Notable for the following components:   Glucose-Capillary 139 (*)    All other components within normal limits  URINE CULTURE  SARS CORONAVIRUS 2 (TAT 6-24 HRS)  LIPASE, BLOOD  BASIC METABOLIC PANEL  BASIC METABOLIC PANEL  BASIC METABOLIC PANEL  SODIUM, URINE, RANDOM  CREATININE, URINE, RANDOM  UREA NITROGEN, URINE  MAGNESIUM  TSH  TROPONIN I (HIGH SENSITIVITY)  TROPONIN I (HIGH SENSITIVITY)    EKG EKG Interpretation  Date/Time:  Friday March 17 2019 23:41:55 EST Ventricular Rate:  90 PR Interval:    QRS Duration: 95 QT Interval:  414 QTC Calculation: 507 R Axis:   46 Text Interpretation: Sinus rhythm Multiform ventricular premature complexes Prolonged QT interval Confirmed by Thayer Jew  [phone]) on 03/18/2019 12:14:01 AM   Radiology Ct Head Wo Contrast  Result Date: 03/18/2019 CLINICAL DATA:  Altered mental status, loss of consciousness EXAM: CT HEAD WITHOUT CONTRAST TECHNIQUE: Contiguous axial images were obtained from the base of the skull through the vertex without intravenous contrast. COMPARISON:  None. FINDINGS: Brain: Motion artifact is noted near the vertex which may limit detection of subtle abnormalities. No evidence of acute infarction, hemorrhage, hydrocephalus, extra-axial collection or mass lesion/mass effect. Symmetric prominence of the ventricles, cisterns and sulci compatible with parenchymal volume loss. Patchy areas of white matter hypoattenuation are most compatible with chronic microvascular angiopathy. Senescent mineralization of the basal ganglia. Vascular: Atherosclerotic calcification of the carotid siphons and intradural vertebral arteries. No hyperdense vessel. Skull: No calvarial fracture or suspicious osseous lesion. No scalp swelling or hematoma. Sinuses/Orbits: Paranasal sinuses and mastoid air cells are predominantly clear. Included orbital structures are unremarkable. Other: None IMPRESSION: 1. Motion artifact is noted near the vertex which may limit detection of subtle abnormalities. 2. No acute intracranial abnormality. 3. Moderate parenchymal atrophy and chronic microvascular angiopathy. Electronically Signed   By: Lovena Le M.D.   On: 03/18/2019 00:48    Procedures Procedures (including critical care time)  Medications Ordered in ED Medications  tamoxifen (NOLVADEX) tablet 20 mg (has no administration in time range)  potassium chloride SA (KLOR-CON) CR tablet 10 mEq (has no administration in time range)  promethazine (PHENERGAN) injection 6.25 mg (6.25 mg Intravenous Given 03/18/19 0019)  sodium chloride 0.9 % bolus 1,000 mL (0 mLs Intravenous Stopped 03/18/19 0341)     Initial Impression / Assessment and Plan / ED Course  I have  reviewed the triage vital signs and the nursing notes.  Pertinent labs & imaging results that were available during my care of the patient were reviewed by me and considered in my medical decision making (see chart for details).        Patient presents with nausea.  Family is concerned she may be dehydrated and has had issues with her blood pressure medications recently.  Also recent increase in antibiotic for UTI.  She is overall nontoxic-appearing.  Vital signs notable for blood pressure of 168/79.  She is afebrile.  Exam is fairly reassuring.  Also reported recent fall.  Lab work and imaging obtained.  Work-up notable for AKI with an elevated creatinine.  Additionally she has hyponatremia with a sodium of 124.  Patient was given fluids.  Urinalysis without obvious urinary tract infection.  This is in the setting of recent antibiotics.  Urine culture was sent.  Patient has remained hemodynamically stable.  Given her hyponatremia and evidence of AKI, feel she would benefit from observation admission for fluids.  Final Clinical Impressions(s) / ED Diagnoses   Final diagnoses:  Hyponatremia  AKI (acute kidney injury) [facility])    ED Discharge Orders    None       Dina Rich, Barbette Hair, MD 03/18/19 [phone]

## 2019-03-18 NOTE — ED Notes (Signed)
Patient transported to CT 

## 2019-03-18 NOTE — Progress Notes (Signed)
PT Cancellation Note  Patient Details Name: Stacy Walls MRN: [id number] DOB: [birth year]   Cancelled Treatment:    Reason Eval/Treat Not Completed: Patient at procedure or test/unavailable   Shary Decamp [facility] 03/18/2019, 4:10 PM [place] Pager [phone] Office [phone]

## 2019-03-18 NOTE — Progress Notes (Signed)
  Echocardiogram 2D Echocardiogram has been performed.  Merrie Roof F 03/18/2019, 11:53 AM

## 2019-03-18 NOTE — ED Notes (Addendum)
ED TO INPATIENT HANDOFF REPORT  ED Nurse Name and Phone #: Thurmond Butts [place] Name/Age/Gender Impact 85 y.o. female Room/Bed: 021C/021C  Code Status   Code Status: Prior  Home/SNF/Other Home Patient oriented to: Is this baseline? No   Triage Complete: Triage complete  Chief Complaint weakness vomiting  Triage Note Pt BIB [facility] from home. Pt complains of AMS/slight LOC at home per EMS. Taking New antibiotic today for chronic UTI. Edema on legs bilaterally. Pt continues repeating random phrases ("I feel like I'm dying"). No neuro deficits noted.   Also complains of coffee ground BM per family.   Hx of renal disease and breast cancer (no dialysis and R. Arm restricted)  [place] 1 week ago at home and was scheduled for CT Scan but was never completed.   EMS VS: 174/80 CBG: 145 O2: 99% RA Pulse: 82   Allergies Allergies  Allergen Reactions  . Ace Inhibitors Cough  . Azithromycin Other (See Comments)    Unknown  . Pioglitazone Other (See Comments)    REACTION: edema  . Sulfonamide Derivatives Rash  . Tramadol Other (See Comments)    dizziness    Level of Care/Admitting Diagnosis ED Disposition    ED Disposition Condition [facility] Area: [place] [[id number]]  Level of Care: Telemetry Medical [104]  I expect the patient will be discharged within 24 hours: Yes  LOW acuity---Tx typically complete <24 hrs---ACUTE conditions typically can be evaluated <24 hours---LABS likely to return to acceptable levels <24 hours---IS near functional baseline---EXPECTED to return to current living arrangement---NOT newly hypoxic: Does not meet criteria for 5C-Observation unit  Covid Evaluation: Asymptomatic Screening Protocol (No Symptoms)  Diagnosis: Hyponatremia [607371]  Admitting Physician: Vianne Bulls [[id number]]  Attending Physician: Vianne Bulls [[id number]]  PT Class (Do Not Modify): Observation [104]  PT Acc Code (Do Not  Modify): Observation [10022]       B Medical/Surgery History Past Medical History:  Diagnosis Date  . Acute cholecystitis   . Allergic rhinitis   . Anemia   . Arthritis   . Cancer [facility])    Right breast  . Chest pain   . Chronic renal insufficiency    followed by Dr Justin Mend stage 3  . Clostridium difficile colitis   . Constipation   . Coronary atherosclerosis of native coronary vessel   . Cough   . Depression    situational - husband died May 15, 2018  . Diabetes mellitus   . Dizziness   . DM2 (diabetes mellitus, type 2) ([place])   . Dyshidrosis   . Family history of adverse reaction to anesthesia    daughter - nausea  . GERD (gastroesophageal reflux disease)   . Gout   . HLD (hyperlipidemia)   . HTN (hypertension)   . Hypothyroidism   . Neck mass   . Neck pain   . Osteoporosis   . Routine general medical examination at a health care facility   . Secondary hyperparathyroidism (of renal origin)   . Urinary incontinence   . Urinary tract infection    Past Surgical History:  Procedure Laterality Date  . bone density  08/21/05  . BREAST LUMPECTOMY WITH RADIOACTIVE SEED AND AXILLARY LYMPH NODE DISSECTION Right 06/07/2018   Procedure: RIGHT BREAST LUMPECTOMY WITH BRACKETED RADIOACTIVE SEEDS AND RIGHT COMPLETE AXILLARY LYMPH NODE DISSECTION;  Surgeon: Fanny Skates, MD;  Location: [facility];  Service: General;  Laterality: Right;  . C-section (other)  1977  . CHOLECYSTECTOMY    .  ELECTROCARDIOGRAM  02/01/06  . L ankle surgery Left 1988  . total left knee Left 2007     A IV Location/Drains/Wounds Patient Lines/Drains/Airways Status   Active Line/Drains/Airways    Name:   Placement date:   Placement time:   Site:   Days:   Peripheral IV 03/18/19 Left Hand   03/18/19    0024    Hand   less than 1   Closed System Drain 1 Right;Lateral Other (Comment) Bulb (JP) 19 Fr.   06/07/18    1600    Other (Comment)   284   External Urinary Catheter   06/07/18    2200    -   284   Incision  (Closed) 06/07/18 Breast Right   06/07/18    1632     284          Intake/Output Last 24 hours  Intake/Output Summary (Last 24 hours) at 03/18/2019 0445 Last data filed at 03/18/2019 0341 Gross per 24 hour  Intake 975 ml  Output -  Net 975 ml    Labs/Imaging Results for orders placed or performed during the hospital encounter of 03/17/19 (from the past 48 hour(s))  CBG monitoring, ED     Status: Abnormal   Collection Time: 03/17/19 11:53 PM  Result Value Ref Range   Glucose-Capillary 139 (H) 70 - 99 mg/dL   Comment 1 Notify RN    Comment 2 Document in Chart   CBC with Differential     Status: Abnormal   Collection Time: 03/18/19 12:03 AM  Result Value Ref Range   WBC 6.4 4.0 - 10.5 K/uL   RBC 3.41 (L) 3.87 - 5.11 MIL/uL   Hemoglobin 10.5 (L) 12.0 - 15.0 g/dL   HCT 30.4 (L) 36.0 - 46.0 %   MCV 89.1 80.0 - 100.0 fL   MCH 30.8 26.0 - 34.0 pg   MCHC 34.5 30.0 - 36.0 g/dL   RDW 12.2 11.5 - 15.5 %   Platelets 128 (L) 150 - 400 K/uL    Comment: REPEATED TO VERIFY   nRBC 0.0 0.0 - 0.2 %   Neutrophils Relative % 84 %   Neutro Abs 5.4 1.7 - 7.7 K/uL   Lymphocytes Relative 6 %   Lymphs Abs 0.4 (L) 0.7 - 4.0 K/uL   Monocytes Relative 9 %   Monocytes Absolute 0.6 0.1 - 1.0 K/uL   Eosinophils Relative 0 %   Eosinophils Absolute 0.0 0.0 - 0.5 K/uL   Basophils Relative 0 %   Basophils Absolute 0.0 0.0 - 0.1 K/uL   Immature Granulocytes 1 %   Abs Immature Granulocytes 0.09 (H) 0.00 - 0.07 K/uL    Comment: Performed at [facility], [address] [place], [place] [address]  Comprehensive metabolic panel     Status: Abnormal   Collection Time: 03/18/19 12:03 AM  Result Value Ref Range   Sodium 124 (L) 135 - 145 mmol/L   Potassium 3.9 3.5 - 5.1 mmol/L   Chloride 87 (L) 98 - 111 mmol/L   CO2 22 22 - 32 mmol/L   Glucose, Bld 143 (H) 70 - 99 mg/dL   BUN 18 8 - 23 mg/dL   Creatinine, Ser 2.16 (H) 0.44 - 1.00 mg/dL   Calcium 10.2 8.9 - 10.3 mg/dL   Total Protein 5.8 (L)  6.5 - 8.1 g/dL   Albumin 3.5 3.5 - 5.0 g/dL   AST 29 15 - 41 U/L   ALT 16 0 - 44 U/L  Alkaline Phosphatase 47 38 - 126 U/L   Total Bilirubin 1.4 (H) 0.3 - 1.2 mg/dL   GFR calc non Af Amer 21 (L) >60 mL/min   GFR calc Af Amer 24 (L) >60 mL/min   Anion gap 15 5 - 15    Comment: Performed at [facility] [address], [place], [place] [address]  Lipase, blood     Status: None   Collection Time: 03/18/19 12:03 AM  Result Value Ref Range   Lipase 21 11 - 51 U/L    Comment: Performed at [facility] [address], [place], [place] [address]  Troponin I (High Sensitivity)     Status: None   Collection Time: 03/18/19 12:03 AM  Result Value Ref Range   Troponin I (High Sensitivity) 15 <18 ng/L    Comment: (NOTE) Elevated high sensitivity troponin I (hsTnI) values and significant  changes across serial measurements may suggest ACS but many other  chronic and acute conditions are known to elevate hsTnI results.  Refer to the "Links" section for chest pain algorithms and additional  guidance. Performed at [facility], [place] [address], [place], [place] [address]   Urinalysis, Routine w reflex microscopic     Status: Abnormal   Collection Time: 03/18/19 12:03 AM  Result Value Ref Range   Color, Urine YELLOW YELLOW   APPearance CLEAR CLEAR   Specific Gravity, Urine 1.009 1.005 - 1.030   pH 8.0 5.0 - 8.0   Glucose, UA NEGATIVE NEGATIVE mg/dL   Hgb urine dipstick NEGATIVE NEGATIVE   Bilirubin Urine NEGATIVE NEGATIVE   Ketones, ur 5 (A) NEGATIVE mg/dL   Protein, ur >=300 (A) NEGATIVE mg/dL   Nitrite NEGATIVE NEGATIVE   Leukocytes,Ua NEGATIVE NEGATIVE   RBC / HPF 0-5 0 - 5 RBC/hpf   WBC, UA 0-5 0 - 5 WBC/hpf   Bacteria, UA NONE SEEN NONE SEEN   Squamous Epithelial / LPF 0-5 0 - 5    Comment: Performed at [facility], [place] [address], [place], [place] [address]   Ct Head Wo Contrast  Result Date: 03/18/2019 CLINICAL DATA:  Altered mental status, loss of  consciousness EXAM: CT HEAD WITHOUT CONTRAST TECHNIQUE: Contiguous axial images were obtained from the base of the skull through the vertex without intravenous contrast. COMPARISON:  None. FINDINGS: Brain: Motion artifact is noted near the vertex which may limit detection of subtle abnormalities. No evidence of acute infarction, hemorrhage, hydrocephalus, extra-axial collection or mass lesion/mass effect. Symmetric prominence of the ventricles, cisterns and sulci compatible with parenchymal volume loss. Patchy areas of white matter hypoattenuation are most compatible with chronic microvascular angiopathy. Senescent mineralization of the basal ganglia. Vascular: Atherosclerotic calcification of the carotid siphons and intradural vertebral arteries. No hyperdense vessel. Skull: No calvarial fracture or suspicious osseous lesion. No scalp swelling or hematoma. Sinuses/Orbits: Paranasal sinuses and mastoid air cells are predominantly clear. Included orbital structures are unremarkable. Other: None IMPRESSION: 1. Motion artifact is noted near the vertex which may limit detection of subtle abnormalities. 2. No acute intracranial abnormality. 3. Moderate parenchymal atrophy and chronic microvascular angiopathy. Electronically Signed   By: Lovena Le M.D.   On: 03/18/2019 00:48    Pending Labs Unresulted Labs (From admission, onward)    Start     Ordered   03/18/19 3267  Basic metabolic panel  Now then every 8 hours,   R (with STAT occurrences)     03/18/19 0408   03/18/19 0500  Magnesium  Tomorrow morning,   R     03/18/19 0429   03/18/19 0500  TSH  Tomorrow morning,   R     03/18/19 0440   03/18/19 0409  Urea nitrogen, urine  Add-on,   AD     03/18/19 0408   03/18/19 0408  Sodium, urine, random  Add-on,   AD     03/18/19 0408   03/18/19 0408  Creatinine, urine, random  Add-on,   AD     03/18/19 0408   03/18/19 0347  SARS CORONAVIRUS 2 (TAT 6-24 HRS) Nasopharyngeal Nasopharyngeal Swab   (Asymptomatic/Tier 3)  Once,   STAT    Question Answer Comment  Is this test for diagnosis or screening Screening   Symptomatic for COVID-19 as defined by CDC No   Hospitalized for COVID-19 No   Admitted to ICU for COVID-19 No   Previously tested for COVID-19 No   Resident in a congregate (group) care setting No   Employed in healthcare setting No   Pregnant No      03/18/19 0347   03/18/19 0026  Urine culture  ONCE - STAT,   STAT     03/18/19 0025   Signed and Held  Hemoglobin A1c  Tomorrow morning,   R    Comments: To assess prior glycemic control    Signed and Held          Vitals/Pain Today's Vitals   03/18/19 0245 03/18/19 0300 03/18/19 0315 03/18/19 0330  BP: (!) 145/72 (!) 143/58 (!) 156/59 (!) 145/81  Pulse: 93 92 (!) 102 98  Resp: 14 12 14  (!) 21  Temp:      TempSrc:      SpO2: 95% 97% 91% 95%  PainSc:        Isolation Precautions No active isolations  Medications Medications  tamoxifen (NOLVADEX) tablet 20 mg (has no administration in time range)  potassium chloride SA (KLOR-CON) CR tablet 10 mEq (has no administration in time range)  promethazine (PHENERGAN) injection 6.25 mg (6.25 mg Intravenous Given 03/18/19 0019)  sodium chloride 0.9 % bolus 1,000 mL (0 mLs Intravenous Stopped 03/18/19 0341)    Mobility walks with person assist High fall risk   Focused Assessments    R Recommendations: See Admitting Provider Note  Report given to: Thane Edu RN  Additional Notes:

## 2019-03-18 NOTE — H&P (Signed)
History and Physical    Stacy Walls VQQ:[id number] DOB: [birth year] DOA: 03/17/2019  PCP: Sonia Side., FNP   Patient coming from: Home   Chief Complaint: Malaise, fatigue, nausea, headache   HPI: Stacy Walls is a 85 y.o. female with medical history significant for cancer of the right breast, chronic kidney disease stage III, hypertension, type 2 diabetes mellitus, depression, anxiety, and recent UTI, now presenting to the emergency department for evaluation of malaise, nausea, fatigue, and headache.  Patient reports that she was recently started on Levaquin for a UTI and subsequently developed nausea, headache, fatigue, and general malaise.  She notes that some of her other medications were also adjusted recently but is unsure of the specifics.  She does not typically get headaches, has had intermittent headache now for the past 2 days, denies any change in vision or hearing or focal numbness or weakness.  She denies fevers, chills, cough, shortness of breath, or chest pain.  She denies any dysuria or flank pain.  She denies gross hematuria. No seizures.   ED Course: Upon arrival to the ED, patient is found to be afebrile, saturating mid 90s on room air, and with stable blood pressure.  EKG features a sinus rhythm with PVCs and QTc interval of 507 ms.  Noncontrast head CT is negative for acute intracranial abnormality though there is motion artifact at the vertex.  Chemistry panel features a sodium of 124 and creatinine of 2.16, up from 1.68 earlier this month.  CBC features a chronic normocytic anemia and mild thrombocytopenia.  High-sensitivity troponin is normal.  Urinalysis notable for proteinuria.  Urine was sent for culture and the patient was treated with a liter of normal saline and Phenergan in the ED.  COVID-19 screening test has not yet resulted.  Review of Systems:  All other systems reviewed and apart from HPI, are negative.  Past Medical History:    Diagnosis Date   Acute cholecystitis    Allergic rhinitis    Anemia    Arthritis    Cancer (HCC)    Right breast   Chest pain    Chronic renal insufficiency    followed by Dr Justin Mend stage 3   Clostridium difficile colitis    Constipation    Coronary atherosclerosis of native coronary vessel    Cough    Depression    situational - husband died 05/28/18   Diabetes mellitus    Dizziness    DM2 (diabetes mellitus, type 2) ([place])    Dyshidrosis    Family history of adverse reaction to anesthesia    daughter - nausea   GERD (gastroesophageal reflux disease)    Gout    HLD (hyperlipidemia)    HTN (hypertension)    Hypothyroidism    Neck mass    Neck pain    Osteoporosis    Routine general medical examination at a health care facility    Secondary hyperparathyroidism (of renal origin)    Urinary incontinence    Urinary tract infection     Past Surgical History:  Procedure Laterality Date   bone density  08/21/05   BREAST LUMPECTOMY WITH RADIOACTIVE SEED AND AXILLARY LYMPH NODE DISSECTION Right 06/07/2018   Procedure: RIGHT BREAST LUMPECTOMY WITH BRACKETED RADIOACTIVE SEEDS AND RIGHT COMPLETE AXILLARY LYMPH NODE DISSECTION;  Surgeon: Fanny Skates, MD;  Location: [facility];  Service: General;  Laterality: Right;   C-section (other)  1977   CHOLECYSTECTOMY     ELECTROCARDIOGRAM  02/01/06   L  ankle surgery Left 1988   total left knee Left 2007     reports that she has never smoked. She has never used smokeless tobacco. She reports that she does not drink alcohol or use drugs.  Allergies  Allergen Reactions   Ace Inhibitors Cough   Azithromycin Other (See Comments)    Unknown   Pioglitazone Other (See Comments)    REACTION: edema   Sulfonamide Derivatives Rash   Tramadol Other (See Comments)    dizziness    Family History  Problem Relation Age of Onset   Coronary artery disease Father    Stroke Father    Diabetes Father     Diabetes Sister    Stroke Sister    Diabetes Brother    Cancer Brother        liver caner   Breast cancer Neg Hx      Prior to Admission medications   Medication Sig Start Date End Date Taking? Authorizing Provider  allopurinol (ZYLOPRIM) 300 MG tablet Take 1 tablet (300 mg total) by mouth daily. 01/27/18   Renato Shin, MD  ALPRAZolam Duanne Moron) 0.25 MG tablet Take 0.25 mg by mouth 3 (three) times daily as needed for anxiety.    [provider]  amLODipine (NORVASC) 5 MG tablet Take 5 mg by mouth daily.    [provider]  aspirin EC 81 MG tablet Take 81 mg by mouth daily.    [provider]  atorvastatin (LIPITOR) 20 MG tablet Take 20 mg by mouth daily.    [provider]  cetirizine (ZYRTEC) 10 MG tablet  09/29/18   [provider]  diclofenac sodium (VOLTAREN) 1 % GEL APPLY 2 GRAMS EXTERNALLY TO THE AFFECTED AREA FOUR TIMES DAILY 08/17/17   Renato Shin, MD  epoetin alfa (PROCRIT) 08657 UNIT/ML injection Inject 10,000 Units into the skin every 30 (thirty) days. Dr. Justin Mend    [provider]  escitalopram (LEXAPRO) 10 MG tablet Take 10 mg by mouth daily. Patient taking it as needed when depressed.    [provider]  Ferrous Sulfate (IRON) 325 (65 FE) MG TABS Take 325 mg by mouth daily.     [provider]  fluticasone (FLONASE) 50 MCG/ACT nasal spray Place 2 sprays into both nostrils daily as needed.     [provider]  furosemide (LASIX) 20 MG tablet Take 1 tablet (20 mg total) by mouth daily. 01/27/18   Renato Shin, MD  HYDROcodone-acetaminophen (NORCO) 5-325 MG tablet Take 1-2 tablets by mouth every 6 (six) hours as needed for moderate pain or severe pain. 06/08/18   Fanny Skates, MD  hydrOXYzine (VISTARIL) 25 MG capsule Take 25 mg by mouth at bedtime as needed for anxiety.     [provider]  levothyroxine (SYNTHROID, LEVOTHROID) 88 MCG tablet Take 1 tablet (88 mcg total) by mouth daily.  01/27/18   Renato Shin, MD  losartan-hydrochlorothiazide (HYZAAR) 100-25 MG tablet Take 1 tablet by mouth daily. 01/27/18   Renato Shin, MD  magnesium hydroxide (MILK OF MAGNESIA) 400 MG/5ML suspension Take 15 mLs by mouth daily as needed for mild constipation.    [provider]  Multiple Vitamins-Minerals (MULTIVITAMIN WOMEN 50+ PO) Take 1 tablet by mouth daily.     [provider]  omeprazole (PRILOSEC) 20 MG capsule TAKE 1 CAPSULE(20 MG) BY MOUTH DAILY 03/28/18   Renato Shin, MD  oxybutynin (DITROPAN) 5 MG tablet 1 tab daily Patient taking differently: Take 5 mg by mouth daily.  09/07/17  Renato Shin, MD  Polyethyl Glycol-Propyl Glycol (SYSTANE) 0.4-0.3 % SOLN Place 1 drop into both eyes daily as needed (for dry eyes).    [provider]  sitaGLIPtin (JANUVIA) 100 MG tablet TAKE 1/2 TABLET(50 MG) BY MOUTH DAILY 11/21/18   Renato Shin, MD  tamoxifen (NOLVADEX) 20 MG tablet TAKE 1 TABLET(20 MG) BY MOUTH DAILY 01/12/19   Truitt Merle, MD  triamcinolone ointment (KENALOG) 0.1 % APPLY TOPICALLY TO THE AFFECTED AREA THREE TIMES DAILY AS NEEDED FOR ITCHING Patient taking differently: Apply 1 application topically 3 (three) times daily as needed (for itching).  03/06/17   Renato Shin, MD  vitamin C (ASCORBIC ACID) 500 MG tablet Take 500 mg by mouth 2 (two) times a week.     [provider]    Physical Exam: Vitals:   03/18/19 0245 03/18/19 0300 03/18/19 0315 03/18/19 0330  BP: (!) 145/72 (!) 143/58 (!) 156/59 (!) 145/81  Pulse: 93 92 (!) 102 98  Resp: 14 12 14  (!) 21  Temp:      TempSrc:      SpO2: 95% 97% 91% 95%    Constitutional: NAD, calm  Eyes: PERTLA, lids and conjunctivae normal ENMT: Mucous membranes are moist. Posterior pharynx clear of any exudate or lesions.   Neck: normal, supple, no masses, no thyromegaly Respiratory: no wheezing, no crackles. No accessory muscle use.  Cardiovascular: S1 & S2 heard, regular rate and rhythm. Lower leg  swelling bilaterally. Abdomen: No distension, no tenderness, soft. Bowel sounds active.  Musculoskeletal: no clubbing / cyanosis. No joint deformity upper and lower extremities.  Skin: no significant rashes, lesions, ulcers. Warm, dry, well-perfused. Neurologic: CN 2-12 grossly intact. Sensation intact. Strength 5/5 in all 4 limbs.  Psychiatric: Alert and oriented to person, place, and situation but not oriented to month or year. Pleasant, cooperative.      Labs on Admission: I have personally reviewed following labs and imaging studies  CBC: Recent Labs  Lab 03/18/19 0003  WBC 6.4  NEUTROABS 5.4  HGB 10.5*  HCT 30.4*  MCV 89.1  PLT 952*   Basic Metabolic Panel: Recent Labs  Lab 03/18/19 0003  NA 124*  K 3.9  CL 87*  CO2 22  GLUCOSE 143*  BUN 18  CREATININE 2.16*  CALCIUM 10.2   GFR: CrCl cannot be calculated (Unknown ideal weight.). Liver Function Tests: Recent Labs  Lab 03/18/19 0003  AST 29  ALT 16  ALKPHOS 47  BILITOT 1.4*  PROT 5.8*  ALBUMIN 3.5   Recent Labs  Lab 03/18/19 0003  LIPASE 21   No results for input(s): AMMONIA in the last 168 hours. Coagulation Profile: No results for input(s): INR, PROTIME in the last 168 hours. Cardiac Enzymes: No results for input(s): CKTOTAL, CKMB, CKMBINDEX, TROPONINI in the last 168 hours. BNP (last 3 results) No results for input(s): PROBNP in the last 8760 hours. HbA1C: No results for input(s): HGBA1C in the last 72 hours. CBG: Recent Labs  Lab 03/17/19 2353  GLUCAP 139*   Lipid Profile: No results for input(s): CHOL, HDL, LDLCALC, TRIG, CHOLHDL, LDLDIRECT in the last 72 hours. Thyroid Function Tests: No results for input(s): TSH, T4TOTAL, FREET4, T3FREE, THYROIDAB in the last 72 hours. Anemia Panel: No results for input(s): VITAMINB12, FOLATE, FERRITIN, TIBC, IRON, RETICCTPCT in the last 72 hours. Urine analysis:    Component Value Date/Time   COLORURINE YELLOW 03/18/2019 0003   APPEARANCEUR CLEAR  03/18/2019 0003   LABSPEC 1.009 03/18/2019 0003   PHURINE 8.0 03/18/2019  0003   GLUCOSEU NEGATIVE 03/18/2019 0003   GLUCOSEU NEGATIVE 07/27/2017 1517   HGBUR NEGATIVE 03/18/2019 0003   BILIRUBINUR NEGATIVE 03/18/2019 0003   BILIRUBINUR negative 09/04/2015 1326   KETONESUR 5 (A) 03/18/2019 0003   PROTEINUR >=300 (A) 03/18/2019 0003   UROBILINOGEN 0.2 07/27/2017 1517   NITRITE NEGATIVE 03/18/2019 0003   LEUKOCYTESUR NEGATIVE 03/18/2019 0003   Sepsis Labs: @LABRCNTIP (procalcitonin:4,lacticidven:4) )No results found for this or any previous visit (from the past 240 hour(s)).   Radiological Exams on Admission: Ct Head Wo Contrast  Result Date: 03/18/2019 CLINICAL DATA:  Altered mental status, loss of consciousness EXAM: CT HEAD WITHOUT CONTRAST TECHNIQUE: Contiguous axial images were obtained from the base of the skull through the vertex without intravenous contrast. COMPARISON:  None. FINDINGS: Brain: Motion artifact is noted near the vertex which may limit detection of subtle abnormalities. No evidence of acute infarction, hemorrhage, hydrocephalus, extra-axial collection or mass lesion/mass effect. Symmetric prominence of the ventricles, cisterns and sulci compatible with parenchymal volume loss. Patchy areas of white matter hypoattenuation are most compatible with chronic microvascular angiopathy. Senescent mineralization of the basal ganglia. Vascular: Atherosclerotic calcification of the carotid siphons and intradural vertebral arteries. No hyperdense vessel. Skull: No calvarial fracture or suspicious osseous lesion. No scalp swelling or hematoma. Sinuses/Orbits: Paranasal sinuses and mastoid air cells are predominantly clear. Included orbital structures are unremarkable. Other: None IMPRESSION: 1. Motion artifact is noted near the vertex which may limit detection of subtle abnormalities. 2. No acute intracranial abnormality. 3. Moderate parenchymal atrophy and chronic microvascular  angiopathy. Electronically Signed   By: Lovena Le M.D.   On: 03/18/2019 00:48    EKG: Independently reviewed. Sinus rhythm, PVC's, QTc 507 ms.   Assessment/Plan   1. Hyponatremia  - Presents with headache, fatigue, dizziness, and malaise, found to have serum sodium of 124, down from 127 on 02/27/19  - She was given a liter of NS in ED  - Appears euvolemic  - Likely secondary to HCTZ and renal disease  - Hold HCTZ, hold Lasix, SLIV, restrict-free water, follow serial chem panels, avoid correcting too rapidly (goal sodium will be 130-132 the morning of 11/29)   2. Acute kidney injury superimposed on CKD III  - SCr is 2.16 on admission, up from 1.68 earlier this month  - She was given a liter of NS in ED  - Hold HCTZ, Lasix, and losartan, renally-dose medications, repeat chem panel in am    3. Breast cancer, right   - She is s/p lumpectomy, LN dissection, and radiation   - Continue tamoxifen, continue oncology follow-up   4. Type II DM  - A1c was 6.3% in February 2020  - Pharmacy med-rec is pending, check CBG's and use SSI with Novolog for now   5. Prolonged QT interval  - QT interval is 507 ms in ED  - Continue cardiac monitoring, avoid QT-prolonging medications, replace potassium to 4 and magnesium to 2, repeat EKG in am   6. Hypothyroidism  - Check TSH in light of hyponatremia and fatigue  - Continue Synthroid pending pharmacy med-rec     PPE: Mask, face shield  DVT prophylaxis: sq heparin  Code Status: Full, confirmed with patient  Family Communication: Son, Mortimer Fries, updated by phone at patient's request Consults called: None  Admission status: Observation     Vianne Bulls, MD [facility] Pager [phone]  If 7PM-7AM, please contact night-coverage [email or web address] Password [email or web address]  03/18/2019, 4:14 AM

## 2019-03-18 NOTE — ED Notes (Signed)
Patient son Mortimer Fries [phone] calling asking for a call back  Would like an update on patient

## 2019-03-18 NOTE — Plan of Care (Signed)

## 2019-03-18 NOTE — ED Notes (Signed)
Pt placed on Purewick 

## 2019-03-18 NOTE — ED Notes (Signed)
Patient placed on pure wick °

## 2019-03-18 NOTE — ED Notes (Signed)
Patient son Mortimer Fries [phone] calling asking for a call back  Would like an update on patient

## 2019-03-18 NOTE — ED Notes (Signed)
Patient unable to stand at this time. Obtained ortho with her lying flat and sitting up.

## 2019-03-18 NOTE — Progress Notes (Addendum)
PROGRESS NOTE  Stacy Walls KYH:[id number] DOB: 06-22-1932 DOA: 03/17/2019 PCP: Sonia Side., FNP  HPI/Recap of past 24 hours: HPI from Dr Tamala Fothergill Stacy Walls is a 85 y.o. female with medical history significant for cancer of the right breast, chronic kidney disease stage III, hypertension, type 2 diabetes mellitus, depression, anxiety, and recent UTI, now presenting to the ED for evaluation of malaise, nausea, fatigue, and headache.  Patient reports that she was recently started on Levaquin for a UTI and subsequently developed the above symptoms. Pt denies fevers, chills, cough, shortness of breath, or chest pain, dysuria. In the ED, VSS, EKG features a sinus rhythm with PVCs and QTc interval of 507 ms, head CT is negative for acute intracranial abnormality, labs showed sodium of 124 and creatinine of 2.16, up from 1.68 earlier this month. High-sensitivity troponin is normal.  Urinalysis notable for proteinuria.  Urine was sent for culture. COVID-19 screening test pending.    Today, patient still with generalized weakness, slight headache, dizziness, feeling of unwell.  Patient denies any further nausea or vomiting, chest pain, shortness of breath, fever/chills.  Assessment/Plan: Principal Problem:   Hyponatremia Active Problems:   Hypothyroidism   Essential hypertension   CAD, NATIVE VESSEL   Diabetes mellitus with renal manifestation (HCC)   Cancer of central portion of right female breast (HCC)   Acute renal failure superimposed on stage 3 chronic kidney disease ([place])  Acute on chronic hyponatremia  Noted to have sodium of 124, down from 127 on 02/27/19 (baseline around 130s) Likely 2/2 ??hydrochlorothiazide/Lasix, with possible poor oral intake Vs SIADH Status post 1 L of normal saline in the ED, will hold off on further fluids for now and reevaluate Urine studies pending Continue to hold HCTZ, Lasix Frequent BMP checks  Prolonged QT interval QT  interval is 507 on presentation Recent use of Levaquin Repeat EKG, monitor electrolytes, continue telemetry  AKI on CKD III Baseline creatinine around 1.6-1.8, on admission 2.16 Status post IV fluids, with improvement Continue to hold HCTZ, Lasix, and losartan, renally-dose medications Daily BMP  ??UTI Was started on Levaquin by PCP, took only 1 dose PTA UA only positive for proteinuria UC pending  Anemia of chronic kidney disease/thrombocytopenia Hemoglobin and platelets at baseline Daily CBC  Hypertension Continue amlodipine, hydralazine Continue to hold hydrochlorothiazide, losartan  Type 2 diabetes mellitus Last A1c 5.2 SSI, Accu-Cheks, hypoglycemic protocol Hold home glipizide, Januvia  Hypothyroidism TSH 5.8, free T4 1.41 elevated Hold home Synthroid for now, repeat in a couple of days or prior to discharge  Hyperlipidemia Continue Lipitor  GERD Continue PPI  Breast cancer, right   S/p lumpectomy, LN dissection, and radiation   Continue tamoxifen, continue oncology follow-up   Morbid obesity Lifestyle modification advised       Malnutrition Type:      Malnutrition Characteristics:      Nutrition Interventions:       Estimated body mass index is 45.18 kg/m as calculated from the following:   Height as of this encounter: 5\' 2"  (1.575 m).   Weight as of this encounter: 112 kg.     Code Status: Full  Family Communication: Discussed with Son on 03/18/2019  Disposition Plan: To be determined   Consultants:  None  Procedures:  None  Antimicrobials:  None  DVT prophylaxis: Heparin   Objective: Vitals:   03/18/19 0330 03/18/19 0536 03/18/19 0537 03/18/19 0804  BP: (!) 145/81  (!) 160/78 (!) 148/59  Pulse: 98  90 87  Resp: (!) 21  18 18   Temp:   98.7 F (37.1 C)   TempSrc:   Oral   SpO2: 95%  96% 100%  Weight:  112 kg    Height:  5\' 2"  (1.575 m)      Intake/Output Summary (Last 24 hours) at 03/18/2019 1104 Last  data filed at 03/18/2019 1000 Gross per 24 hour  Intake 1215 ml  Output --  Net 1215 ml   Filed Weights   03/18/19 0536  Weight: 112 kg    Exam:   General: NAD, lethargic, chronically ill-appearing  Cardiovascular: S1, S2 present  Respiratory:  Diminished breath sounds at the bases  Abdomen: Soft, nontender, nondistended, bowel sounds present  Musculoskeletal: 1+ bilateral pedal edema noted  Skin: Normal  Psychiatry: Normal mood   Data Reviewed: CBC: Recent Labs  Lab 03/18/19 0003  WBC 6.4  NEUTROABS 5.4  HGB 10.5*  HCT 30.4*  MCV 89.1  PLT 500*   Basic Metabolic Panel: Recent Labs  Lab 03/18/19 0003 03/18/19 0551  NA 124* 124*  K 3.9 4.0  CL 87* 88*  CO2 22 25  GLUCOSE 143* 136*  BUN 18 18  CREATININE 2.16* 1.94*  CALCIUM 10.2 9.9  MG  --  1.7   GFR: Estimated Creatinine Clearance: 26 mL/min (A) (by C-G formula based on SCr of 1.94 mg/dL (H)). Liver Function Tests: Recent Labs  Lab 03/18/19 0003  AST 29  ALT 16  ALKPHOS 47  BILITOT 1.4*  PROT 5.8*  ALBUMIN 3.5   Recent Labs  Lab 03/18/19 0003  LIPASE 21   No results for input(s): AMMONIA in the last 168 hours. Coagulation Profile: No results for input(s): INR, PROTIME in the last 168 hours. Cardiac Enzymes: No results for input(s): CKTOTAL, CKMB, CKMBINDEX, TROPONINI in the last 168 hours. BNP (last 3 results) No results for input(s): PROBNP in the last 8760 hours. HbA1C: Recent Labs    03/18/19 0551  HGBA1C 5.2   CBG: Recent Labs  Lab 03/17/19 2353 03/18/19 0540  GLUCAP 139* 133*   Lipid Profile: No results for input(s): CHOL, HDL, LDLCALC, TRIG, CHOLHDL, LDLDIRECT in the last 72 hours. Thyroid Function Tests: Recent Labs    03/18/19 0551  TSH 5.807*  FREET4 1.41*   Anemia Panel: No results for input(s): VITAMINB12, FOLATE, FERRITIN, TIBC, IRON, RETICCTPCT in the last 72 hours. Urine analysis:    Component Value Date/Time   COLORURINE YELLOW 03/18/2019 0003    APPEARANCEUR CLEAR 03/18/2019 0003   LABSPEC 1.009 03/18/2019 0003   PHURINE 8.0 03/18/2019 0003   GLUCOSEU NEGATIVE 03/18/2019 0003   GLUCOSEU NEGATIVE 07/27/2017 1517   HGBUR NEGATIVE 03/18/2019 0003   BILIRUBINUR NEGATIVE 03/18/2019 0003   BILIRUBINUR negative 09/04/2015 1326   KETONESUR 5 (A) 03/18/2019 0003   PROTEINUR >=300 (A) 03/18/2019 0003   UROBILINOGEN 0.2 07/27/2017 1517   NITRITE NEGATIVE 03/18/2019 0003   LEUKOCYTESUR NEGATIVE 03/18/2019 0003   Sepsis Labs: @LABRCNTIP (procalcitonin:4,lacticidven:4)  )No results found for this or any previous visit (from the past 240 hour(s)).    Studies: Ct Head Wo Contrast  Result Date: 03/18/2019 CLINICAL DATA:  Altered mental status, loss of consciousness EXAM: CT HEAD WITHOUT CONTRAST TECHNIQUE: Contiguous axial images were obtained from the base of the skull through the vertex without intravenous contrast. COMPARISON:  None. FINDINGS: Brain: Motion artifact is noted near the vertex which may limit detection of subtle abnormalities. No evidence of acute infarction, hemorrhage, hydrocephalus, extra-axial collection or mass lesion/mass effect. Symmetric prominence of  the ventricles, cisterns and sulci compatible with parenchymal volume loss. Patchy areas of white matter hypoattenuation are most compatible with chronic microvascular angiopathy. Senescent mineralization of the basal ganglia. Vascular: Atherosclerotic calcification of the carotid siphons and intradural vertebral arteries. No hyperdense vessel. Skull: No calvarial fracture or suspicious osseous lesion. No scalp swelling or hematoma. Sinuses/Orbits: Paranasal sinuses and mastoid air cells are predominantly clear. Included orbital structures are unremarkable. Other: None IMPRESSION: 1. Motion artifact is noted near the vertex which may limit detection of subtle abnormalities. 2. No acute intracranial abnormality. 3. Moderate parenchymal atrophy and chronic microvascular  angiopathy. Electronically Signed   By: Lovena Le M.D.   On: 03/18/2019 00:48   Dg Chest Port 1 View  Result Date: 03/18/2019 CLINICAL DATA:  Shortness of breath EXAM: PORTABLE CHEST 1 VIEW COMPARISON:  Chest CT April 27, 2018 FINDINGS: There is no edema or consolidation. Heart is slightly enlarged with pulmonary vascularity normal. No adenopathy. There is aortic atherosclerosis. There are surgical clips in the right lateral breast/axillary region. IMPRESSION: No edema or consolidation. Stable cardiac prominence. Aortic Atherosclerosis (ICD10-I70.0). Electronically Signed   By: Lowella Grip III M.D.   On: 03/18/2019 08:06    Scheduled Meds:  heparin  5,000 Units Subcutaneous Q8H   insulin aspart  0-9 Units Subcutaneous TID WC   sodium chloride flush  3 mL Intravenous Q12H   tamoxifen  20 mg Oral Daily    Continuous Infusions:   LOS: 0 days     Alma Friendly, MD [facility]  If 7PM-7AM, please contact night-coverage [email or web address] 03/18/2019, 11:04 AM

## 2019-03-18 NOTE — Progress Notes (Signed)
PT Cancellation Note  Patient Details Name: Stacy Walls MRN: [id number] DOB: 12/05/1932   Cancelled Treatment:    Reason Eval/Treat Not Completed: Fatigue/lethargy limiting ability to participate attempted to work with patient, very pleasant but very fatigued, falling asleep while talking to PT this afternoon. Holding eval due to significant fatigue. Will attempt to check back later if time/schedule allow, otherwise will attempt on next date of service.    Windell Norfolk, DPT, PN1   Supplemental Physical Therapist [facility]    Pager [phone] Acute Rehab Office [phone]

## 2019-03-18 NOTE — Plan of Care (Signed)
Poc progressing.  

## 2019-03-19 DIAGNOSIS — I2510 Atherosclerotic heart disease of native coronary artery without angina pectoris: Secondary | ICD-10-CM

## 2019-03-19 LAB — CBC WITH DIFFERENTIAL/PLATELET
Abs Immature Granulocytes: 0.03 10*3/uL (ref 0.00–0.07)
Basophils Absolute: 0 10*3/uL (ref 0.0–0.1)
Basophils Relative: 0 %
Eosinophils Absolute: 0.1 10*3/uL (ref 0.0–0.5)
Eosinophils Relative: 1 %
HCT: 27.2 % — ABNORMAL LOW (ref 36.0–46.0)
Hemoglobin: 9.2 g/dL — ABNORMAL LOW (ref 12.0–15.0)
Immature Granulocytes: 1 %
Lymphocytes Relative: 19 %
Lymphs Abs: 1.1 10*3/uL (ref 0.7–4.0)
MCH: 31 pg (ref 26.0–34.0)
MCHC: 33.8 g/dL (ref 30.0–36.0)
MCV: 91.6 fL (ref 80.0–100.0)
Monocytes Absolute: 0.9 10*3/uL (ref 0.1–1.0)
Monocytes Relative: 15 %
Neutro Abs: 3.9 10*3/uL (ref 1.7–7.7)
Neutrophils Relative %: 64 %
Platelets: 115 10*3/uL — ABNORMAL LOW (ref 150–400)
RBC: 2.97 MIL/uL — ABNORMAL LOW (ref 3.87–5.11)
RDW: 12.6 % (ref 11.5–15.5)
WBC: 6 10*3/uL (ref 4.0–10.5)
nRBC: 0 % (ref 0.0–0.2)

## 2019-03-19 LAB — UREA NITROGEN, URINE: Urea Nitrogen, Ur: 274 mg/dL

## 2019-03-19 LAB — BASIC METABOLIC PANEL
Anion gap: 10 (ref 5–15)
BUN: 17 mg/dL (ref 8–23)
CO2: 25 mmol/L (ref 22–32)
Calcium: 9.6 mg/dL (ref 8.9–10.3)
Chloride: 91 mmol/L — ABNORMAL LOW (ref 98–111)
Creatinine, Ser: 1.99 mg/dL — ABNORMAL HIGH (ref 0.44–1.00)
GFR calc Af Amer: 26 mL/min — ABNORMAL LOW (ref >60)
GFR calc non Af Amer: 23 mL/min — ABNORMAL LOW (ref >60)
Glucose, Bld: 99 mg/dL (ref 70–99)
Potassium: 4.2 mmol/L (ref 3.5–5.1)
Sodium: 126 mmol/L — ABNORMAL LOW (ref 135–145)

## 2019-03-19 LAB — URINE CULTURE: Culture: <10000 — AB

## 2019-03-19 LAB — GLUCOSE, CAPILLARY
Glucose-Capillary: 100 mg/dL — ABNORMAL HIGH (ref 70–99)
Glucose-Capillary: 145 mg/dL — ABNORMAL HIGH (ref 70–99)
Glucose-Capillary: 114 mg/dL — ABNORMAL HIGH (ref 70–99)
Glucose-Capillary: 167 mg/dL — ABNORMAL HIGH (ref 70–99)

## 2019-03-19 MED ORDER — GLUCERNA SHAKE PO LIQD
237.0000 mL | Freq: Three times a day (TID) | ORAL | Status: DC
  Administered 2019-03-19 – 2019-03-20 (×3): 237 mL via ORAL

## 2019-03-19 MED ORDER — LEVOTHYROXINE SODIUM 88 MCG PO TABS
88.0000 ug | ORAL_TABLET | Freq: Every day | ORAL | Status: DC
  Administered 2019-03-19 – 2019-03-27 (×9): 88 ug via ORAL
  Filled 2019-03-19 (×8): qty 1

## 2019-03-19 MED ORDER — SENNOSIDES-DOCUSATE SODIUM 8.6-50 MG PO TABS
1.0000 | ORAL_TABLET | Freq: Every day | ORAL | Status: DC
  Administered 2019-03-19: 1 via ORAL
  Filled 2019-03-19: qty 1

## 2019-03-19 NOTE — Plan of Care (Signed)

## 2019-03-19 NOTE — Progress Notes (Signed)
Updated pt's son Ahtziry Saathoff x 2 at 2010 and 2210. Will continue to monitor.

## 2019-03-19 NOTE — Progress Notes (Signed)
Pt's Son minetta krisher updated on pt's condition. Will continue to monitor.

## 2019-03-19 NOTE — Progress Notes (Signed)
PROGRESS NOTE  Krissa Utke TKZ:[id number] DOB: 1933-03-12 DOA: 03/17/2019 PCP: Sonia Side., FNP  HPI/Recap of past 24 hours: HPI from Dr Tamala Fothergill Azlynn Mitnick is a 85 y.o. female with medical history significant for cancer of the right breast, chronic kidney disease stage III, hypertension, type 2 diabetes mellitus, depression, anxiety, and recent UTI, now presenting to the ED for evaluation of malaise, nausea, fatigue, and headache.  Patient reports that she was recently started on Levaquin for a UTI and subsequently developed the above symptoms. Pt denies fevers, chills, cough, shortness of breath, or chest pain, dysuria. In the ED, VSS, EKG features a sinus rhythm with PVCs and QTc interval of 507 ms, head CT is negative for acute intracranial abnormality, labs showed sodium of 124 and creatinine of 2.16, up from 1.68 earlier this month. High-sensitivity troponin is normal.  Urinalysis notable for proteinuria.  Urine was sent for culture. COVID-19 screening test pending.    Today, patient reports feeling slightly better, reports no further headaches, although still having generalized weakness.  Denies any nausea/vomiting, chest pain, shortness of breath, fever/chills.  Assessment/Plan: Principal Problem:   Hyponatremia Active Problems:   Hypothyroidism   Essential hypertension   CAD, NATIVE VESSEL   Diabetes mellitus with renal manifestation (HCC)   Cancer of central portion of right female breast (HCC)   Acute renal failure superimposed on stage 3 chronic kidney disease ([place])  Acute on chronic hyponatremia  Noted to have sodium of 124, down from 127 on 02/27/19 (baseline around 130s) Likely 2/2 ??hydrochlorothiazide/Lasix, with possible poor oral intake Vs SIADH Status post 1 L of normal saline in the ED, will hold off on further fluids for now and reevaluate Continue to hold HCTZ, Lasix Frequent BMP checks  Prolonged QT interval QT interval is 507 on  presentation, repeat EKG with normal QTC Recent use of Levaquin Continue telemetry  AKI on CKD III Baseline creatinine around 1.6-1.8, on admission 2.16 Status post IV fluids, with improvement Continue to hold HCTZ, Lasix, and losartan, renally-dose medications Daily BMP  ??UTI Was started on Levaquin by PCP, took only 1 dose PTA UA only positive for proteinuria UC <10,000 insignificant growth  Anemia of chronic kidney disease/thrombocytopenia Hemoglobin and platelets at baseline Daily CBC  Hypertension Continue amlodipine, hydralazine Continue to hold hydrochlorothiazide, losartan  Type 2 diabetes mellitus Last A1c 5.2 SSI, Accu-Cheks, hypoglycemic protocol Hold home glipizide, Januvia  Hypothyroidism TSH 5.8, free T4 1.41 elevated Continue Synthroid  Hyperlipidemia Continue Lipitor  GERD Continue PPI  Breast cancer, right   S/p lumpectomy, LN dissection, and radiation   Continue tamoxifen, continue oncology follow-up   Morbid obesity Lifestyle modification advised       Malnutrition Type:      Malnutrition Characteristics:      Nutrition Interventions:       Estimated body mass index is 45.36 kg/m as calculated from the following:   Height as of this encounter: 5\' 2"  (1.575 m).   Weight as of this encounter: 112.5 kg.     Code Status: Full  Family Communication: Discussed with Son on 03/19/2019  Disposition Plan: To be determined   Consultants:  None  Procedures:  None  Antimicrobials:  None  DVT prophylaxis: Heparin   Objective: Vitals:   03/18/19 2058 03/19/19 0444 03/19/19 0715 03/19/19 1311  BP: (!) 146/57 (!) 151/59 (!) 143/57 (!) 132/51  Pulse: 84 87 80 75  Resp: 18  20 18   Temp: 99.9 F (37.7 C) 99.7  F (37.6 C) 99.6 F (37.6 C) 99.1 F (37.3 C)  TempSrc: Oral Oral Oral Oral  SpO2: 96% 98% 96% 96%  Weight:  112.5 kg    Height:        Intake/Output Summary (Last 24 hours) at 03/19/2019 1716 Last  data filed at 03/19/2019 1500 Gross per 24 hour  Intake 340 ml  Output 700 ml  Net -360 ml   Filed Weights   03/18/19 0536 03/19/19 0444  Weight: 112 kg 112.5 kg    Exam:   General: NAD, chronically ill-appearing  Cardiovascular: S1, S2 present  Respiratory: Diminished breath sounds at the bases  Abdomen: Soft, nontender, nondistended, bowel sounds present  Musculoskeletal: 1+ bilateral pedal edema noted  Skin: Normal  Psychiatry: Normal mood   Data Reviewed: CBC: Recent Labs  Lab 03/18/19 0003 03/19/19 0516  WBC 6.4 6.0  NEUTROABS 5.4 3.9  HGB 10.5* 9.2*  HCT 30.4* 27.2*  MCV 89.1 91.6  PLT 128* 546*   Basic Metabolic Panel: Recent Labs  Lab 03/18/19 0003 03/18/19 0551 03/18/19 1318 03/18/19 2041 03/19/19 0516  NA 124* 124* 122* 124* 126*  K 3.9 4.0 4.0 4.3 4.2  CL 87* 88* 89* 89* 91*  CO2 22 25 24 26 25   GLUCOSE 143* 136* 109* 103* 99  BUN 18 18 16 16 17   CREATININE 2.16* 1.94* 1.88* 1.93* 1.99*  CALCIUM 10.2 9.9 9.6 9.7 9.6  MG  --  1.7  --   --   --    GFR: Estimated Creatinine Clearance: 25.4 mL/min (A) (by C-G formula based on SCr of 1.99 mg/dL (H)). Liver Function Tests: Recent Labs  Lab 03/18/19 0003  AST 29  ALT 16  ALKPHOS 47  BILITOT 1.4*  PROT 5.8*  ALBUMIN 3.5   Recent Labs  Lab 03/18/19 0003  LIPASE 21   No results for input(s): AMMONIA in the last 168 hours. Coagulation Profile: No results for input(s): INR, PROTIME in the last 168 hours. Cardiac Enzymes: No results for input(s): CKTOTAL, CKMB, CKMBINDEX, TROPONINI in the last 168 hours. BNP (last 3 results) No results for input(s): PROBNP in the last 8760 hours. HbA1C: Recent Labs    03/18/19 0551  HGBA1C 5.2   CBG: Recent Labs  Lab 03/18/19 1659 03/18/19 2124 03/19/19 0623 03/19/19 1146 03/19/19 1628  GLUCAP 112* 106* 100* 145* 114*   Lipid Profile: No results for input(s): CHOL, HDL, LDLCALC, TRIG, CHOLHDL, LDLDIRECT in the last 72 hours. Thyroid  Function Tests: Recent Labs    03/18/19 0551  TSH 5.807*  FREET4 1.41*   Anemia Panel: No results for input(s): VITAMINB12, FOLATE, FERRITIN, TIBC, IRON, RETICCTPCT in the last 72 hours. Urine analysis:    Component Value Date/Time   COLORURINE YELLOW 03/18/2019 0003   APPEARANCEUR CLEAR 03/18/2019 0003   LABSPEC 1.009 03/18/2019 0003   PHURINE 8.0 03/18/2019 0003   GLUCOSEU NEGATIVE 03/18/2019 0003   GLUCOSEU NEGATIVE 07/27/2017 1517   HGBUR NEGATIVE 03/18/2019 0003   BILIRUBINUR NEGATIVE 03/18/2019 0003   BILIRUBINUR negative 09/04/2015 1326   KETONESUR 5 (A) 03/18/2019 0003   PROTEINUR >=300 (A) 03/18/2019 0003   UROBILINOGEN 0.2 07/27/2017 1517   NITRITE NEGATIVE 03/18/2019 0003   LEUKOCYTESUR NEGATIVE 03/18/2019 0003   Sepsis Labs: @LABRCNTIP (procalcitonin:4,lacticidven:4)  ) Recent Results (from the past 240 hour(s))  Urine culture     Status: Abnormal   Collection Time: 03/18/19  2:29 AM   Specimen: Urine, Random  Result Value Ref Range Status   Specimen Description URINE,  RANDOM  Final   Special Requests NONE  Final   Culture (A)  Final    <10,000 COLONIES/mL INSIGNIFICANT GROWTH Performed at [facility], [place] [address] [place], [place] [address]    Report Status 03/19/2019 FINAL  Final  SARS CORONAVIRUS 2 (TAT 6-24 HRS) Nasopharyngeal Nasopharyngeal Swab     Status: None   Collection Time: 03/18/19  4:47 AM   Specimen: Nasopharyngeal Swab  Result Value Ref Range Status   SARS Coronavirus 2 NEGATIVE NEGATIVE Final    Comment: (NOTE) SARS-CoV-2 target nucleic acids are NOT DETECTED. The SARS-CoV-2 RNA is generally detectable in upper and lower respiratory specimens during the acute phase of infection. Negative results do not preclude SARS-CoV-2 infection, do not rule out co-infections with other pathogens, and should not be used as the sole basis for treatment or other patient management decisions. Negative results must be combined with clinical  observations, patient history, and epidemiological information. The expected result is Negative. Fact Sheet for Patients: SugarRoll.be Fact Sheet for Healthcare Providers: https://www.woods-mathews.com/ This test is not yet approved or cleared by the [place] FDA and  has been authorized for detection and/or diagnosis of SARS-CoV-2 by FDA under an Emergency Use Authorization (EUA). This EUA will remain  in effect (meaning this test can be used) for the duration of the COVID-19 declaration under Section 56 4(b)(1) of the Act, 21 U.S.C. section 360bbb-3(b)(1), unless the authorization is terminated or revoked sooner. Performed at [facility], [place] [address] [place], [place] [address]       Studies: No results found.  Scheduled Meds: . amLODipine  5 mg Oral Daily  . aspirin EC  81 mg Oral Daily  . atorvastatin  20 mg Oral Daily  . diclofenac sodium  2 g Topical QID  . ferrous sulfate  325 mg Oral Daily  . fluticasone  2 spray Each Nare Daily  . heparin  5,000 Units Subcutaneous Q8H  . hydrALAZINE  50 mg Oral BID  . insulin aspart  0-9 Units Subcutaneous TID WC  . levothyroxine  88 mcg Oral Q0600  . loratadine  10 mg Oral Daily  . lubiprostone  24 mcg Oral BID  . oxybutynin  5 mg Oral Daily  . pantoprazole  40 mg Oral Daily  . sodium chloride flush  3 mL Intravenous Q12H  . tamoxifen  20 mg Oral Daily    Continuous Infusions:   LOS: 1 day     Alma Friendly, MD [facility]  If 7PM-7AM, please contact night-coverage [email or web address] 03/19/2019, 5:16 PM

## 2019-03-19 NOTE — Evaluation (Signed)
Physical Therapy Evaluation Patient Details Name: Stacy Walls MRN: [id number] DOB: February 04, 1933 Today's Date: 03/19/2019   History of Present Illness  Pt is a 77 t/o female with PMH of R breast CA, CKD III, DM2, HTN, depression, anxiety, UTI, presenting to ED for evaluation of malaise, nausea, fatigue and headache. CT negative, COVID negative. Found with hyponatremia.   Clinical Impression  Pt admitted with above diagnosis and presents to PT with functional limitations due to deficits listed below (See PT problem list). Pt needs skilled PT to maximize independence and safety to allow discharge to SNF unless family can provide 24 hour assist.      Follow Up Recommendations SNF;Supervision/Assistance - 24 hour(Unless family can provide 24 hour assist)    Equipment Recommendations  Rolling walker with 5" wheels    Recommendations for Other Services       Precautions / Restrictions Precautions Precautions: Fall Restrictions Weight Bearing Restrictions: No      Mobility  Bed Mobility Overal bed mobility: Needs Assistance Bed Mobility: Supine to Sit     Supine to sit: Mod assist;HOB elevated     General bed mobility comments: mod assist for BLE and trunk support to EOB; increased time and effort. Verbal cues for technique  Transfers Overall transfer level: Needs assistance Equipment used: Rolling walker (2 wheeled) Transfers: Sit to/from [facility] Sit to Stand: Min assist;+2 physical assistance;+2 safety/equipment Stand pivot transfers: Min assist;+2 physical assistance;+2 safety/equipment       General transfer comment: Assist to bring hips up and for balance  Ambulation/Gait Ambulation/Gait assistance: Min assist;+2 physical assistance Gait Distance (Feet): 3 Feet Assistive device: Rolling walker (2 wheeled) Gait Pattern/deviations: Step-through pattern;Decreased step length - right;Decreased step length - left;Shuffle Gait velocity:  decr Gait velocity interpretation: <1.31 ft/sec, indicative of household ambulator General Gait Details: Assist for balance and support.   Stairs            Wheelchair Mobility    Modified Rankin (Stroke Patients Only)       Balance Overall balance assessment: Needs assistance Sitting-balance support: No upper extremity supported;Feet supported Sitting balance-Leahy Scale: Fair     Standing balance support: During functional activity;Bilateral upper extremity supported Standing balance-Leahy Scale: Poor Standing balance comment: walker and min guard for static standing                             Pertinent Vitals/Pain Pain Assessment: No/denies pain    Home Living Family/patient expects to be discharged to:: Private residence Living Arrangements: Other relatives(grandson) Available Help at Discharge: Family;Available PRN/intermittently Type of Home: House Home Access: Stairs to enter   Entrance Stairs-Number of Steps: 1 Home Layout: One level Home Equipment: Cane - single point;Bedside commode      Prior Function Level of Independence: Needs assistance   Gait / Transfers Assistance Needed: modified independent without assistive   ADL's / Homemaking Assistance Needed: independent with ADLs, limited IADLs         Hand Dominance   Dominant Hand: Right    Extremity/Trunk Assessment   Upper Extremity Assessment Upper Extremity Assessment: Defer to OT evaluation    Lower Extremity Assessment Lower Extremity Assessment: Generalized weakness       Communication   Communication: No difficulties  Cognition Arousal/Alertness: Awake/alert Behavior During Therapy: WFL for tasks assessed/performed Overall Cognitive Status: Within Functional Limits for tasks assessed  General Comments: appears [facility]       General Comments      Exercises     Assessment/Plan    PT Assessment Patient needs  continued PT services  PT Problem List Decreased strength;Decreased activity tolerance;Decreased balance;Decreased mobility;Obesity       PT Treatment Interventions DME instruction;Gait training;Functional mobility training;Therapeutic activities;Therapeutic exercise;Balance training;Patient/family education    PT Goals (Current goals can be found in the Care Plan section)  Acute Rehab PT Goals Patient Stated Goal: to get stronger PT Goal Formulation: With patient Time For Goal Achievement: 04/02/19 Potential to Achieve Goals: Good    Frequency Min 3X/week   Barriers to discharge Decreased caregiver support Unsure if 24 hour assist available    Co-evaluation PT/OT/SLP Co-Evaluation/Treatment: Yes Reason for Co-Treatment: For patient/therapist safety;To address functional/ADL transfers PT goals addressed during session: Mobility/safety with mobility;Proper use of DME OT goals addressed during session: ADL's and self-care       AM-PAC PT "6 Clicks" Mobility  Outcome Measure Help needed turning from your back to your side while in a flat bed without using bedrails?: A Lot Help needed moving from lying on your back to sitting on the side of a flat bed without using bedrails?: A Lot Help needed moving to and from a bed to a chair (including a wheelchair)?: A Lot Help needed standing up from a chair using your arms (e.g., wheelchair or bedside chair)?: A Lot Help needed to walk in hospital room?: A Lot Help needed climbing 3-5 steps with a railing? : Total 6 Click Score: 11    End of Session Equipment Utilized During Treatment: Gait belt Activity Tolerance: Patient tolerated treatment well Patient left: in chair;with call bell/phone within reach;with bed alarm set Nurse Communication: Mobility status PT Visit Diagnosis: Unsteadiness on feet (R26.81);Other abnormalities of gait and mobility (R26.89);Muscle weakness (generalized) (M62.81)    Time: 9629-5284 PT Time Calculation  (min) (ACUTE ONLY): 21 min   Charges:   PT Evaluation $PT Eval Moderate Complexity: [place] Pager [phone] Office [place] 03/19/2019, 4:30 PM

## 2019-03-19 NOTE — Progress Notes (Signed)
Patient drank a full bottle of ensure before taking meds. Patient's son is updated. Patient is in bed with no complains at this moment. Will continue to monitor.

## 2019-03-19 NOTE — Evaluation (Signed)
Occupational Therapy Evaluation Patient Details Name: Stacy Walls MRN: [id number] DOB: November 28, 1932 Today's Date: 03/19/2019    History of Present Illness Pt is a 37 t/o female with PMH of R breast CA, CKD III, DM2, HTN, depression, anxiety, UTI, presenting to ED for evaluation of malaise, nausea, fatigue and headache. CT negative, COVID negative. Found with hyponatremia.    Clinical Impression   PTA patient independent with mobility, ADls, limited IADLs. Admitted for above and limited by problem list below, including impaired balance, decreased activity tolerance, generalized weakness. Patient currently requires min assist +2 for transfers, min assist for UB ADLs, max assist for LB ADLs and total assist for toileting.  Patient will benefit from continued OT services while admitted and after dc at SNF level in order to optimize independence and return to PLOF with ADLs/mobility.      Follow Up Recommendations  SNF;Supervision/Assistance - 24 hour(if has 24/7 support at home, [facility] and aide)    Equipment Recommendations  3 in 1 bedside commode    Recommendations for Other Services       Precautions / Restrictions Precautions Precautions: Fall Restrictions Weight Bearing Restrictions: No      Mobility Bed Mobility Overal bed mobility: Needs Assistance Bed Mobility: Supine to Sit     Supine to sit: Mod assist;HOB elevated     General bed mobility comments: mod assist for BLE and trunk support to EOB; increased time and effort  Transfers Overall transfer level: Needs assistance Equipment used: Rolling walker (2 wheeled) Transfers: Sit to/from [facility] Sit to Stand: Min assist;+2 physical assistance;+2 safety/equipment Stand pivot transfers: Min assist;+2 physical assistance;+2 safety/equipment       General transfer comment: min assist +2 to power up and steady from EOB     Balance Overall balance assessment: Needs  assistance Sitting-balance support: No upper extremity supported;Feet supported Sitting balance-Leahy Scale: Fair     Standing balance support: During functional activity;Bilateral upper extremity supported Standing balance-Leahy Scale: Poor Standing balance comment: relaint on BUE and external support                           ADL either performed or assessed with clinical judgement   ADL Overall ADL's : Needs assistance/impaired     Grooming: Set up;Sitting   Upper Body Bathing: Set up;Sitting   Lower Body Bathing: +2 for physical assistance;Sit to/from stand;Moderate assistance Lower Body Bathing Details (indicate cue type and reason): assist to reach feet and buttocks, min assist +2 stand  Upper Body Dressing : Minimal assistance;Sitting   Lower Body Dressing: Sit to/from stand;Maximal assistance Lower Body Dressing Details (indicate cue type and reason): assist to reach feet and buttocks, min assist +2 stand  Toilet Transfer: Minimal assistance;+2 for physical assistance;+2 for safety/equipment;RW Toilet Transfer Details (indicate cue type and reason): simulated to recliner  Toileting- Clothing Manipulation and Hygiene: Total assistance;+2 for physical assistance;Sit to/from stand Toileting - Clothing Manipulation Details (indicate cue type and reason): total assist for hygiene care      Functional mobility during ADLs: Minimal assistance;+2 for physical assistance;+2 for safety/equipment       Vision   Vision Assessment?: No apparent visual deficits     Perception     Praxis      Pertinent Vitals/Pain Pain Assessment: No/denies pain     Hand Dominance Right   Extremity/Trunk Assessment Upper Extremity Assessment Upper Extremity Assessment: Generalized weakness   Lower Extremity Assessment Lower Extremity Assessment: Defer  to PT evaluation       Communication Communication Communication: No difficulties   Cognition Arousal/Alertness:  Awake/alert Behavior During Therapy: WFL for tasks assessed/performed Overall Cognitive Status: Within Functional Limits for tasks assessed                                 General Comments: appears [facility]    General Comments       Exercises     Shoulder Instructions      Home Living Family/patient expects to be discharged to:: Private residence Living Arrangements: Other relatives(grandson) Available Help at Discharge: Family;Available PRN/intermittently Type of Home: House Home Access: Stairs to enter [employer] of Steps: 1   Home Layout: One level     Bathroom Shower/Tub: Other (comment)(sponge bathing only)   Bathroom Toilet: Handicapped height     Home Equipment: [place] - single point;Bedside commode          Prior Functioning/Environment Level of Independence: Needs assistance  Gait / Transfers Assistance Needed: modified independent without assistive  ADL's / Homemaking Assistance Needed: independent with ADLs, limited IADLs             OT Problem List: Decreased strength;Impaired balance (sitting and/or standing);Decreased activity tolerance;Decreased knowledge of use of DME or AE;Decreased knowledge of precautions;Obesity      OT Treatment/Interventions: Self-care/ADL training;DME and/or AE instruction;Therapeutic exercise;Therapeutic activities;Patient/family education;Balance training    OT Goals(Current goals can be found in the care plan section) Acute Rehab OT Goals Patient Stated Goal: to get stronger OT Goal Formulation: With patient Time For Goal Achievement: 04/02/19 Potential to Achieve Goals: Good  OT Frequency: Min 2X/week   Barriers to D/C: Decreased caregiver support          Co-evaluation PT/OT/SLP Co-Evaluation/Treatment: Yes Reason for Co-Treatment: To address functional/ADL transfers;For patient/therapist safety   OT goals addressed during session: ADL's and self-care      AM-PAC OT "6 Clicks" Daily  Activity     Outcome Measure Help from another person eating meals?: None Help from another person taking care of personal grooming?: A Little Help from another person toileting, which includes using toliet, bedpan, or urinal?: Total Help from another person bathing (including washing, rinsing, drying)?: A Lot Help from another person to put on and taking off regular upper body clothing?: A Little Help from another person to put on and taking off regular lower body clothing?: A Lot 6 Click Score: 15   End of Session Equipment Utilized During Treatment: Gait belt;Rolling walker Nurse Communication: Mobility status  Activity Tolerance: Patient tolerated treatment well Patient left: in chair;with call bell/phone within reach;with chair alarm set;with nursing/sitter in room  OT Visit Diagnosis: Other abnormalities of gait and mobility (R26.89);Muscle weakness (generalized) (M62.81)                Time: 1419-1440 OT Time Calculation (min): 21 min Charges:  OT General Charges $OT Visit: 1 Visit OT Evaluation $OT Eval Moderate Complexity: [place], OT Acute Rehabilitation Services Pager [phone] Office [phone]    Delight Stare 03/19/2019, 4:07 PM

## 2019-03-19 NOTE — Progress Notes (Signed)
Patient's son at bedside and requesting information on his mother's health's condition. He is is updated and all questions answered to his satisfaction.

## 2019-03-20 LAB — T4, FREE: Free T4: 1.35 ng/dL — ABNORMAL HIGH (ref 0.61–1.12)

## 2019-03-20 LAB — CBC WITH DIFFERENTIAL/PLATELET
Abs Immature Granulocytes: 0.03 10*3/uL (ref 0.00–0.07)
Basophils Absolute: 0 10*3/uL (ref 0.0–0.1)
Basophils Relative: 1 %
Eosinophils Absolute: 0.1 10*3/uL (ref 0.0–0.5)
Eosinophils Relative: 2 %
HCT: 25.8 % — ABNORMAL LOW (ref 36.0–46.0)
Hemoglobin: 8.6 g/dL — ABNORMAL LOW (ref 12.0–15.0)
Immature Granulocytes: 1 %
Lymphocytes Relative: 17 %
Lymphs Abs: 1.1 10*3/uL (ref 0.7–4.0)
MCH: 30.5 pg (ref 26.0–34.0)
MCHC: 33.3 g/dL (ref 30.0–36.0)
MCV: 91.5 fL (ref 80.0–100.0)
Monocytes Absolute: 1 10*3/uL (ref 0.1–1.0)
Monocytes Relative: 15 %
Neutro Abs: 4.3 10*3/uL (ref 1.7–7.7)
Neutrophils Relative %: 64 %
Platelets: 110 10*3/uL — ABNORMAL LOW (ref 150–400)
RBC: 2.82 MIL/uL — ABNORMAL LOW (ref 3.87–5.11)
RDW: 12.3 % (ref 11.5–15.5)
WBC: 6.6 10*3/uL (ref 4.0–10.5)
nRBC: 0 % (ref 0.0–0.2)

## 2019-03-20 LAB — BASIC METABOLIC PANEL
Anion gap: 8 (ref 5–15)
BUN: 24 mg/dL — ABNORMAL HIGH (ref 8–23)
CO2: 27 mmol/L (ref 22–32)
Calcium: 9.2 mg/dL (ref 8.9–10.3)
Chloride: 87 mmol/L — ABNORMAL LOW (ref 98–111)
Creatinine, Ser: 2 mg/dL — ABNORMAL HIGH (ref 0.44–1.00)
GFR calc Af Amer: 26 mL/min — ABNORMAL LOW (ref >60)
GFR calc non Af Amer: 23 mL/min — ABNORMAL LOW (ref >60)
Glucose, Bld: 120 mg/dL — ABNORMAL HIGH (ref 70–99)
Potassium: 4.6 mmol/L (ref 3.5–5.1)
Sodium: 122 mmol/L — ABNORMAL LOW (ref 135–145)

## 2019-03-20 LAB — RENAL FUNCTION PANEL
Albumin: 2.7 g/dL — ABNORMAL LOW (ref 3.5–5.0)
Anion gap: 11 (ref 5–15)
BUN: 22 mg/dL (ref 8–23)
CO2: 24 mmol/L (ref 22–32)
Calcium: 9.5 mg/dL (ref 8.9–10.3)
Chloride: 89 mmol/L — ABNORMAL LOW (ref 98–111)
Creatinine, Ser: 2 mg/dL — ABNORMAL HIGH (ref 0.44–1.00)
GFR calc Af Amer: 26 mL/min — ABNORMAL LOW (ref >60)
GFR calc non Af Amer: 23 mL/min — ABNORMAL LOW (ref >60)
Glucose, Bld: 130 mg/dL — ABNORMAL HIGH (ref 70–99)
Phosphorus: 2.4 mg/dL — ABNORMAL LOW (ref 2.5–4.6)
Potassium: 4.6 mmol/L (ref 3.5–5.1)
Sodium: 124 mmol/L — ABNORMAL LOW (ref 135–145)

## 2019-03-20 LAB — GLUCOSE, CAPILLARY
Glucose-Capillary: 113 mg/dL — ABNORMAL HIGH (ref 70–99)
Glucose-Capillary: 140 mg/dL — ABNORMAL HIGH (ref 70–99)
Glucose-Capillary: 121 mg/dL — ABNORMAL HIGH (ref 70–99)
Glucose-Capillary: 160 mg/dL — ABNORMAL HIGH (ref 70–99)

## 2019-03-20 LAB — CREATININE, URINE, RANDOM: Creatinine, Urine: 65.72 mg/dL

## 2019-03-20 LAB — CORTISOL: Cortisol, Plasma: 15.2 ug/dL

## 2019-03-20 LAB — TSH: TSH: 11.724 u[IU]/mL — ABNORMAL HIGH (ref 0.350–4.500)

## 2019-03-20 LAB — OSMOLALITY: Osmolality: 261 mOsm/kg — ABNORMAL LOW (ref 275–295)

## 2019-03-20 LAB — SODIUM, URINE, RANDOM: Sodium, Ur: 18 mmol/L

## 2019-03-20 LAB — BRAIN NATRIURETIC PEPTIDE: B Natriuretic Peptide: 73.6 pg/mL (ref 0.0–100.0)

## 2019-03-20 LAB — OSMOLALITY, URINE: Osmolality, Ur: 208 mOsm/kg — ABNORMAL LOW (ref 300–900)

## 2019-03-20 MED ORDER — TRAZODONE HCL 50 MG PO TABS
50.0000 mg | ORAL_TABLET | Freq: Every day | ORAL | Status: DC
  Administered 2019-03-20: 50 mg via ORAL
  Filled 2019-03-20: qty 1

## 2019-03-20 MED ORDER — FUROSEMIDE 40 MG PO TABS: 40.0000 mg | ORAL_TABLET | Freq: Once | ORAL | Status: DC

## 2019-03-20 MED ORDER — FUROSEMIDE 20 MG PO TABS
20.0000 mg | ORAL_TABLET | Freq: Once | ORAL | Status: AC
  Administered 2019-03-20: 20 mg via ORAL
  Filled 2019-03-20: qty 1

## 2019-03-20 MED ORDER — ATORVASTATIN CALCIUM 10 MG PO TABS
20.0000 mg | ORAL_TABLET | Freq: Every day | ORAL | Status: DC
  Administered 2019-03-21 – 2019-03-27 (×7): 20 mg via ORAL
  Filled 2019-03-20 (×9): qty 2

## 2019-03-20 MED ORDER — ENSURE ENLIVE PO LIQD
237.0000 mL | Freq: Two times a day (BID) | ORAL | Status: DC
  Administered 2019-03-20 – 2019-03-27 (×11): 237 mL via ORAL

## 2019-03-20 MED ORDER — BISACODYL 5 MG PO TBEC
10.0000 mg | DELAYED_RELEASE_TABLET | Freq: Every day | ORAL | Status: DC
  Administered 2019-03-20 – 2019-03-23 (×4): 10 mg via ORAL
  Filled 2019-03-20 (×4): qty 2

## 2019-03-20 MED ORDER — ACETAMINOPHEN 325 MG PO TABS
650.0000 mg | ORAL_TABLET | Freq: Three times a day (TID) | ORAL | Status: DC
  Administered 2019-03-20 – 2019-03-27 (×20): 650 mg via ORAL
  Filled 2019-03-20 (×20): qty 2

## 2019-03-20 MED ORDER — ONDANSETRON HCL 4 MG/2ML IJ SOLN
4.0000 mg | INTRAMUSCULAR | Status: DC | PRN
  Administered 2019-03-21 (×2): 4 mg via INTRAVENOUS
  Filled 2019-03-20 (×2): qty 2

## 2019-03-20 NOTE — Consult Note (Signed)
Reason for Consult: Hyponatremia Referring Physician:  Horris Latino, MD  Stacy Walls is an 85 y.o. female.  HPI: Stacy Walls has a PMH significant for breast cancer (s/p right mastectimy with radioactive seeds, adjuvant XRT with persistent lymphedema), CKD stage 3 (Cr 1.7-2 followed by Dr. Justin Walls), HTN, DM, depression, anxiety, and grade 1 diastolic dysfunction who presented to [facility] ED on 03/18/19 with a 2 day history of nausea, HA, malaise, and fatigue.  She reported that she was diagnosed with an UTI and started on levaquin prior to her presentation.  She was afebrile and found to be hypoxic in the ED.  Labs revealed a serum sodium of 124 and Cr of 2.16.  She was covid negative and admitted for further evaluation and management of her hyponatremia.  Her HCTZ and lasix were stopped and her sodium did not significantly change and we were consulted to help manage her hyponatremia.  The trend in serum sodium and Cr is seen below.  Labs on 11/28 regarding hyponatremia: urine sodium 43, urine osmolality 294, serum osmolality 260.  Trend in Serum Sodium: Sodium  Date/Time Value Ref Range Status  03/20/2019 03:45 PM 124 (L) 135 - 145 mmol/L Final  03/20/2019 05:19 AM 122 (L) 135 - 145 mmol/L Final  03/19/2019 05:16 AM 126 (L) 135 - 145 mmol/L Final  03/18/2019 08:41 PM 124 (L) 135 - 145 mmol/L Final  03/18/2019 01:18 PM 122 (L) 135 - 145 mmol/L Final  03/18/2019 05:51 AM 124 (L) 135 - 145 mmol/L Final  03/18/2019 12:03 AM 124 (L) 135 - 145 mmol/L Final  02/27/2019 09:24 AM 127 (L) 135 - 145 mmol/L Final  01/09/2019 11:05 AM 132 (L) 135 - 145 mmol/L Final  11/28/2018 01:05 PM 130 (L) 135 - 145 mmol/L Final  06/08/2018 02:14 AM 134 (L) 135 - 145 mmol/L Final  05/31/2018 11:06 AM 136 135 - 145 mmol/L Final  04/08/2018 03:47 PM 130 (L) 135 - 145 mmol/L Final  09/07/2017 11:30 AM 136 135 - 145 mEq/L Final  04/10/2017 08:37 PM 136 135 - 145 mmol/L Final  04/01/2015 12:00 PM 137 135 - 145 mEq/L  Final  08/09/2012 02:30 PM 129 (L) 135 - 145 mEq/L Final  05/26/2011 10:49 AM 135 135 - 145 mEq/L Final  01/12/2011 11:16 AM 138 135 - 145 mEq/L Final  02/24/2010 01:58 PM 138 135 - 145 mEq/L Final  09/05/2009 02:10 PM 132 (L) 135 - 145 mEq/L Final  09/04/2009 11:37 AM 134 (L) 135 - 145 meq/L Final  08/23/2009 12:36 PM 126 (L) 135 - 145 mEq/L Final  08/21/2009 127 meq/L   08/10/2009 04:00 AM 138 135 - 145 mEq/L Final  08/09/2009 05:30 AM 133 (L) 135 - 145 mEq/L Final  08/08/2009 03:40 AM 134 (L) 135 - 145 mEq/L Final  08/07/2009 08:00 AM 134 (L) 135 - 145 mEq/L Final  08/06/2009 04:45 AM 136 135 - 145 mEq/L Final  08/05/2009 03:45 AM 132 (L) 135 - 145 mEq/L Final  08/04/2009 06:52 AM 134 (L) 135 - 145 mEq/L Final  08/03/2009 04:06 AM 136 135 - 145 mEq/L Final  08/01/2009 04:25 AM 134 (L) 135 - 145 mEq/L Final  07/31/2009 06:29 AM 137 135 - 145 mEq/L Final  04/08/2009 02:38 PM 140 135 - 145 mEq/L Final  03/20/2009 02:37 PM 138 135 - 145 mEq/L Final  02/14/2009 02:43 PM 137 135 - 145 mEq/L Final  01/31/2009 03:11 PM 138 135 - 145 mEq/L Final  01/03/2009 03:05 PM 136 135 - 145  mEq/L Final  12/26/2008 10:09 AM 138 135 - 145 meq/L Final  12/01/2007 02:27 PM 138  Final  02/07/2007 09:20 AM 143 135 - 145 meq/L Final   Trend in Serum Creatinine Creatinine, Ser  Date/Time Value Ref Range Status  03/20/2019 03:45 PM 2.00 (H) 0.44 - 1.00 mg/dL Final  03/20/2019 05:19 AM 2.00 (H) 0.44 - 1.00 mg/dL Final  03/19/2019 05:16 AM 1.99 (H) 0.44 - 1.00 mg/dL Final  03/18/2019 08:41 PM 1.93 (H) 0.44 - 1.00 mg/dL Final  03/18/2019 01:18 PM 1.88 (H) 0.44 - 1.00 mg/dL Final  03/18/2019 05:51 AM 1.94 (H) 0.44 - 1.00 mg/dL Final  03/18/2019 12:03 AM 2.16 (H) 0.44 - 1.00 mg/dL Final  02/27/2019 09:24 AM 1.68 (H) 0.44 - 1.00 mg/dL Final  01/09/2019 11:05 AM 1.69 (H) 0.44 - 1.00 mg/dL Final  06/08/2018 02:14 AM 1.67 (H) 0.44 - 1.00 mg/dL Final  06/07/2018 10:40 PM 1.76 (H) 0.44 - 1.00 mg/dL Final   05/31/2018 11:06 AM 1.86 (H) 0.44 - 1.00 mg/dL Final  09/07/2017 11:30 AM 1.75 (H) 0.40 - 1.20 mg/dL Final  04/10/2017 08:37 PM 2.10 (H) 0.44 - 1.00 mg/dL Final  04/01/2015 12:00 PM 1.70 (H) 0.40 - 1.20 mg/dL Final  08/09/2012 02:30 PM 1.8 (H) 0.4 - 1.2 mg/dL Final  05/26/2011 10:49 AM 2.00 (H) 0.50 - 1.10 mg/dL Final  01/12/2011 11:16 AM 1.6 (H) 0.4 - 1.2 mg/dL Final  02/24/2010 01:58 PM 1.91 (H) 0.4 - 1.2 mg/dL Final  09/05/2009 02:10 PM 1.55 (H) 0.4 - 1.2 mg/dL Final  09/04/2009 11:37 AM 1.5 (H) 0.4 - 1.2 mg/dL Final  08/23/2009 12:36 PM 1.77 (H) 0.4 - 1.2 mg/dL Final  08/21/2009 1.90 mg/dL   08/10/2009 04:00 AM 2.06 (H) 0.4 - 1.2 mg/dL Final  08/09/2009 05:30 AM 2.20 (H) 0.4 - 1.2 mg/dL Final  08/08/2009 03:40 AM 2.46 (H) 0.4 - 1.2 mg/dL Final  08/07/2009 08:00 AM 2.54 (H) 0.4 - 1.2 mg/dL Final  08/06/2009 04:45 AM 2.83 (H) 0.4 - 1.2 mg/dL Final  08/05/2009 03:45 AM 3.03 (H) 0.4 - 1.2 mg/dL Final  08/04/2009 06:52 AM 3.38 (H) 0.4 - 1.2 mg/dL Final  08/03/2009 04:06 AM 2.57 (H) 0.4 - 1.2 mg/dL Final  08/01/2009 04:25 AM 2.01 (H) 0.4 - 1.2 mg/dL Final  07/31/2009 06:29 AM 2.08 (H) 0.4 - 1.2 mg/dL Final  04/08/2009 02:38 PM 2.50 (H) 0.4 - 1.2 mg/dL Final  03/20/2009 02:37 PM 2.52 (H) 0.4 - 1.2 mg/dL Final  02/14/2009 02:43 PM 2.40 (H) 0.4 - 1.2 mg/dL Final  01/31/2009 03:11 PM 2.70 (H) 0.4 - 1.2 mg/dL Final  01/03/2009 03:05 PM 1.79 (H) 0.4 - 1.2 mg/dL Final  12/26/2008 10:09 AM 2.2 (H) 0.4 - 1.2 mg/dL Final  02/07/2007 09:20 AM 2.2 (H) 0.4 - 1.2 mg/dL Final   PMH:   Past Medical History:  Diagnosis Date  . Acute cholecystitis   . Allergic rhinitis   . Anemia   . Arthritis   . Cancer [facility])    Right breast  . Chest pain   . Chronic renal insufficiency    followed by Dr Stacy Walls stage 3  . Clostridium difficile colitis   . Constipation   . Coronary atherosclerosis of native coronary vessel   . Cough   . Depression    situational - husband died 2018/05/28  . Diabetes mellitus    . Dizziness   . DM2 (diabetes mellitus, type 2) ([place])   . Dyshidrosis   . Family history of adverse reaction to  anesthesia    daughter - nausea  . GERD (gastroesophageal reflux disease)   . Gout   . HLD (hyperlipidemia)   . HTN (hypertension)   . Hypothyroidism   . Neck mass   . Neck pain   . Osteoporosis   . Routine general medical examination at a health care facility   . Secondary hyperparathyroidism (of renal origin)   . Urinary incontinence   . Urinary tract infection     PSH:   Past Surgical History:  Procedure Laterality Date  . bone density  08/21/05  . BREAST LUMPECTOMY WITH RADIOACTIVE SEED AND AXILLARY LYMPH NODE DISSECTION Right 06/07/2018   Procedure: RIGHT BREAST LUMPECTOMY WITH BRACKETED RADIOACTIVE SEEDS AND RIGHT COMPLETE AXILLARY LYMPH NODE DISSECTION;  Surgeon: Fanny Skates, MD;  Location: [place];  Service: General;  Laterality: Right;  . C-section (other)  1977  . CHOLECYSTECTOMY    . ELECTROCARDIOGRAM  02/01/06  . L ankle surgery Left 1988  . total left knee Left 2007    Allergies:  Allergies  Allergen Reactions  . Ace Inhibitors Cough  . Azithromycin Other (See Comments)    Unknown  . Pioglitazone Other (See Comments)    REACTION: edema  . Sulfonamide Derivatives Rash  . Tramadol Other (See Comments)    dizziness    Medications:   Prior to Admission medications   Medication Sig Start Date End Date Taking? Authorizing Provider  allopurinol (ZYLOPRIM) 300 MG tablet Take 1 tablet (300 mg total) by mouth daily. 01/27/18  Yes Renato Shin, MD  ALPRAZolam Duanne Moron) 0.25 MG tablet Take 0.25 mg by mouth 3 (three) times daily as needed for anxiety.   Yes [provider]  AMITIZA 24 MCG capsule Take 24 mcg by mouth 2 (two) times daily. 03/17/19  Yes [provider]  aspirin EC 81 MG tablet Take 81 mg by mouth daily.   Yes [provider]  Calcium Carb-Cholecalciferol (CALCIUM 500 +D PO) Take 1 tablet by mouth daily.   Yes  [provider]  cetirizine (ZYRTEC) 10 MG tablet Take 10 mg by mouth daily.  09/29/18  Yes [provider]  diclofenac sodium (VOLTAREN) 1 % GEL APPLY 2 GRAMS EXTERNALLY TO THE AFFECTED AREA FOUR TIMES DAILY Patient taking differently: Apply 2 g topically 4 (four) times daily as needed (painful joints).  08/17/17  Yes Renato Shin, MD  escitalopram (LEXAPRO) 10 MG tablet Take 10 mg by mouth daily. Patient taking it as needed when depressed.   Yes [provider]  Ferrous Sulfate (IRON) 325 (65 FE) MG TABS Take 325 mg by mouth daily.    Yes [provider]  fluticasone (FLONASE) 50 MCG/ACT nasal spray Place 2 sprays into both nostrils daily as needed.    Yes [provider]  furosemide (LASIX) 20 MG tablet Take 1 tablet (20 mg total) by mouth daily. 01/27/18  Yes Renato Shin, MD  hydrALAZINE (APRESOLINE) 50 MG tablet Take 100 mg by mouth 2 (two) times daily.  03/13/19  Yes [provider]  levofloxacin (LEVAQUIN) 750 MG tablet Take 750 mg by mouth daily. 03/17/19  Yes [provider]  levothyroxine (SYNTHROID, LEVOTHROID) 88 MCG tablet Take 1 tablet (88 mcg total) by mouth daily. 01/27/18  Yes Renato Shin, MD  Multiple Vitamins-Minerals (MULTIVITAMIN WOMEN 50+ PO) Take 1 tablet by mouth daily.    Yes [provider]  omeprazole (PRILOSEC) 20 MG capsule TAKE 1 CAPSULE(20 MG) BY MOUTH DAILY Patient taking differently: Take 20 mg by mouth  daily.  03/28/18  Yes Renato Shin, MD  ondansetron (ZOFRAN) 4 MG tablet Take 4 mg by mouth every 6 (six) hours as needed. 03/15/19  Yes [provider]  Polyethyl Glycol-Propyl Glycol (SYSTANE) 0.4-0.3 % SOLN Place 1 drop into both eyes daily as needed (for dry eyes).   Yes [provider]  polyethylene glycol (MIRALAX / GLYCOLAX) 17 g packet Take 17 g by mouth daily.   Yes [provider]  tamoxifen (NOLVADEX) 20 MG tablet TAKE 1 TABLET(20 MG) BY MOUTH  DAILY Patient taking differently: Take 20 mg by mouth daily. TAKE 1 TABLET(20 MG) BY MOUTH DAIL 01/12/19  Yes Truitt Merle, MD  triamcinolone ointment (KENALOG) 0.1 % APPLY TOPICALLY TO THE AFFECTED AREA THREE TIMES DAILY AS NEEDED FOR ITCHING Patient taking differently: Apply 1 application topically 3 (three) times daily as needed (for itching).  03/06/17  Yes Renato Shin, MD  vitamin C (ASCORBIC ACID) 500 MG tablet Take 500 mg by mouth 2 (two) times a week.    Yes [provider]  VITAMIN D PO Take 1 tablet by mouth daily.   Yes [provider]  losartan-hydrochlorothiazide (HYZAAR) 100-25 MG tablet Take 1 tablet by mouth daily. Patient not taking: Reported on 03/18/2019 01/27/18   Renato Shin, MD  sitaGLIPtin (JANUVIA) 100 MG tablet TAKE 1/2 TABLET(50 MG) BY MOUTH DAILY Patient not taking: Reported on 03/18/2019 11/21/18   Renato Shin, MD    Discontinued Meds:   Medications Discontinued During This Encounter  Medication Reason  . epoetin alfa (PROCRIT) 48546 UNIT/ML injection Patient Preference  . glipiZIDE (GLUCOTROL) 10 MG tablet Discontinued by provider  . HYDROcodone-acetaminophen (NORCO) 5-325 MG tablet Completed Course  . magnesium hydroxide (MILK OF MAGNESIA) 400 MG/5ML suspension No longer needed (for PRN medications)  . atorvastatin (LIPITOR) 20 MG tablet Discontinued by provider  . hydrOXYzine (VISTARIL) 25 MG capsule No longer needed (for PRN medications)  . montelukast (SINGULAIR) 10 MG tablet Discontinued by provider  . amLODipine (NORVASC) 5 MG tablet Discontinued by provider  . oxybutynin (DITROPAN) 5 MG tablet Discontinued by provider  . Polyethyl Glycol-Propyl Glycol 0.4-0.3 % SOLN 1 drop Not available  . amLODipine (NORVASC) tablet 5 mg   . aspirin EC tablet 81 mg   . atorvastatin (LIPITOR) tablet 20 mg   . loratadine (CLARITIN) tablet 10 mg   . ferrous sulfate tablet 325 mg   . feeding supplement (GLUCERNA SHAKE) (GLUCERNA SHAKE) liquid 237 mL    . furosemide (LASIX) tablet 40 mg   . amLODipine (NORVASC) tablet 5 mg   . acetaminophen (TYLENOL) tablet 650 mg   . acetaminophen (TYLENOL) suppository 650 mg   . lubiprostone (AMITIZA) capsule 24 mcg   . oxybutynin (DITROPAN) tablet 5 mg   . atorvastatin (LIPITOR) tablet 20 mg   . loratadine (CLARITIN) tablet 10 mg   . senna-docusate (Senokot-S) tablet 1 tablet   . ondansetron (ZOFRAN) injection 4 mg Dose change    Social History:  reports that she has never smoked. She has never used smokeless tobacco. She reports that she does not drink alcohol or use drugs.  Family History:   Family History  Problem Relation Age of Onset  . Coronary artery disease Father   . Stroke Father   . Diabetes Father   . Diabetes Sister   . Stroke Sister   . Diabetes Brother   . Cancer Brother        liver caner  . Breast cancer Neg Hx  Pertinent items are noted in HPI.  Blood pressure (!) 148/54, pulse 82, temperature 100 F (37.8 C), temperature source Oral, resp. rate 17, height 5\' 2"  (1.575 m), weight 113.4 kg, SpO2 96 %. General appearance: alert, cooperative and no distress Head: Normocephalic, without obvious abnormality, atraumatic Resp: clear to auscultation bilaterally Cardio: regular rate and rhythm and no rub GI: soft, non-tender; bowel sounds normal; no masses,  no organomegaly Extremities: edema lymphedema of right upper extremity  Labs: Basic Metabolic Panel: Recent Labs  Lab 03/18/19 0003 03/18/19 0551 03/18/19 1318 03/18/19 2041 03/19/19 0516 03/20/19 0519 03/20/19 1545  NA 124* 124* 122* 124* 126* 122* 124*  K 3.9 4.0 4.0 4.3 4.2 4.6 4.6  CL 87* 88* 89* 89* 91* 87* 89*  CO2 22 25 24 26 25 27 24   GLUCOSE 143* 136* 109* 103* 99 120* 130*  BUN 18 18 16 16 17  24* 22  CREATININE 2.16* 1.94* 1.88* 1.93* 1.99* 2.00* 2.00*  ALBUMIN 3.5  --   --   --   --   --  2.7*  CALCIUM 10.2 9.9 9.6 9.7 9.6 9.2 9.5  PHOS  --   --   --   --   --   --  2.4*   Liver Function  Tests: Recent Labs  Lab 03/18/19 0003 03/20/19 1545  AST 29  --   ALT 16  --   ALKPHOS 47  --   BILITOT 1.4*  --   PROT 5.8*  --   ALBUMIN 3.5 2.7*   Recent Labs  Lab 03/18/19 0003  LIPASE 21   No results for input(s): AMMONIA in the last 168 hours. CBC: Recent Labs  Lab 03/18/19 0003 03/19/19 0516 03/20/19 0519  WBC 6.4 6.0 6.6  NEUTROABS 5.4 3.9 4.3  HGB 10.5* 9.2* 8.6*  HCT 30.4* 27.2* 25.8*  MCV 89.1 91.6 91.5  PLT 128* 115* 110*   PT/INR: @labrcntip (inr:5) Cardiac Enzymes: No results for input(s): CKTOTAL, CKMB, CKMBINDEX, TROPONINI in the last 168 hours. CBG: Recent Labs  Lab 03/19/19 1628 03/19/19 2124 03/20/19 0619 03/20/19 1110 03/20/19 1624  GLUCAP 114* 167* 113* 140* 121*    Iron Studies: No results for input(s): IRON, TIBC, TRANSFERRIN, FERRITIN in the last 168 hours.  Xrays/Other Studies: No results found.   Assessment/Plan: 1.  Hyponatremia- initially was likely combination of nausea, quinolone, HCTZ, and volume depletion.  Now with ongoing hyponatremia off of offending agents.  She does have some edema of RUE but no lower ext edema or presacral edema.  Evaluation is complicated by presence of CKD and her inability to fully dilute her urine.  Unfortunately repeat urine Na and osmolality were not performed at the time of this note despite being ordered 8 hours ago so difficult to assess renal handling of sodium.  She does have edema and was on lasix as an outpatient.  Will dose with po lasix and follow response.  Will also need to follow orthostatic vital signs to r/o volume depletion.  Her TSH is markedly elevated and awaiting free T3 level.   1. Repeat urine Na down to 18 and urine osmolality 208 and serum osmolality 261 consistent with an appropriate response in patient with CKD and possible volume depletion.   2. Will follow sodium levels and if sodium falls again will add IV NS along with lasix to help correct sodium levels.  2. Diastolic CHF-  no lower ext or presacral edema.  Will resume lasix and follow 3. CKD stage 3- she  is within her baseline Cr range.  Cont to hold ARB and HCTZ 4. Anemia of CKD- was on procrit monthly.  Now with significant drop.  Would guaiac stools and follow h/h.  redose ESA and follow. 5. HTN- holding ARB/HCTZ and amlodipine per son's request. 6. DM- per primary svc 7. Hypothyroidism- TSH up to 11.724   Donetta Potts 03/20/2019, 8:07 PM

## 2019-03-20 NOTE — Progress Notes (Signed)
PROGRESS NOTE  Caresse Sedivy BJY:[id number] DOB: 08/05/1932 DOA: 03/17/2019 PCP: Sonia Side., FNP  HPI/Recap of past 24 hours: HPI from Dr Tamala Fothergill Eliska Hamil is a 85 y.o. female with medical history significant for cancer of the right breast, chronic kidney disease stage III, hypertension, type 2 diabetes mellitus, depression, anxiety, and recent UTI, now presenting to the ED for evaluation of malaise, nausea, fatigue, and headache.  Patient reports that she was recently started on Levaquin for a UTI and subsequently developed the above symptoms. Pt denies fevers, chills, cough, shortness of breath, or chest pain, dysuria. In the ED, VSS, EKG features a sinus rhythm with PVCs and QTc interval of 507 ms, head CT is negative for acute intracranial abnormality, labs showed sodium of 124 and creatinine of 2.16, up from 1.68 earlier this month. High-sensitivity troponin is normal.  Urinalysis notable for proteinuria.  Urine was sent for culture. COVID-19 screening test pending.    Today, patient still complaining of generalized weakness, not back to her baseline, poor appetite, denies any chest pain, shortness of breath, abdominal pain, nausea/vomiting, fever/chills.  Assessment/Plan: Principal Problem:   Hyponatremia Active Problems:   Hypothyroidism   Essential hypertension   CAD, NATIVE VESSEL   Diabetes mellitus with renal manifestation (HCC)   Cancer of central portion of right female breast (HCC)   Acute renal failure superimposed on stage 3 chronic kidney disease ([place])  Acute on chronic hyponatremia Worsening  Noted to have sodium of 124, down from 127 on 02/27/19 (baseline around 130s) Likely 2/2 ??hydrochlorothiazide/Lasix, with possible poor oral intake Vs SIADH Vs dCHF exacerbation  Status post 1 L of normal saline in the ED, will hold off on further fluids for now and reevaluate Nephrology consulted Continue to hold HCTZ, may resume lasix pending  nephrology Daily BMP checks  ?? Acute on chronic diastolic HF Appears somewhat overloaded, although volume status hard to evaluate due to body habitus (gained 3 pounds since admission) BNP pending Chest x-ray with no edema Echo with 60 to 65% EF, grade 1 diastolic dysfunction Plan to resume Lasix pending nephrology recommendation  AKI on CKD III Baseline creatinine around 1.6-1.8, on admission 2.16 Status post IV fluids, with some improvement Continue to hold HCTZ, Lasix (may restart, as above) Daily BMP  ??UTI Was started on Levaquin by PCP, took only 1 dose PTA UA only positive for proteinuria UC <10,000 insignificant growth  Prolonged QT interval Resolved QT interval is 507 on presentation, repeat EKG with normal QTC Recent use of Levaquin Continue telemetry  Anemia of chronic kidney disease/thrombocytopenia Hemoglobin slowly trending down, no obvious signs of bleeding Anemia panel pending FOBT pending Type and screen pending Daily CBC  Hypertension Continue amlodipine, hydralazine (son refusing for pt to have amlodipine, as they relate recent changes to beginning amlodipine) Continue to hold hydrochlorothiazide, losartan  Type 2 diabetes mellitus Last A1c 5.2 SSI, Accu-Cheks, hypoglycemic protocol Hold home glipizide, Januvia  Hypothyroidism TSH 5.8, free T4 1.41 elevated Continue Synthroid  Hyperlipidemia Continue Lipitor  GERD Continue PPI  Breast cancer, right   S/p lumpectomy, LN dissection, and radiation   Continue tamoxifen, continue oncology follow-up   Morbid obesity Lifestyle modification advised       Malnutrition Type:      Malnutrition Characteristics:      Nutrition Interventions:       Estimated body mass index is 45.73 kg/m as calculated from the following:   Height as of this encounter: 5\' 2"  (1.575 m).  Weight as of this encounter: 113.4 kg.     Code Status: Full  Family Communication: Discussed with Son on  03/19/2019  Disposition Plan: To be determined   Consultants:  Nephrology  Procedures:  None  Antimicrobials:  None  DVT prophylaxis: Heparin   Objective: Vitals:   03/20/19 0316 03/20/19 0651 03/20/19 0937 03/20/19 1113  BP:  (!) 131/50 (!) 139/57 (!) 148/54  Pulse:  78  82  Resp:  18  17  Temp:  100 F (37.8 C)    TempSrc:  Oral    SpO2:  98%  96%  Weight: 113.4 kg     Height:        Intake/Output Summary (Last 24 hours) at 03/20/2019 1248 Last data filed at 03/20/2019 1241 Gross per 24 hour  Intake 960 ml  Output 1650 ml  Net -690 ml   Filed Weights   03/18/19 0536 03/19/19 0444 03/20/19 0316  Weight: 112 kg 112.5 kg 113.4 kg    Exam:   General: NAD, chronically ill-appearing  Cardiovascular: S1, S2 present  Respiratory:  Diminished breath sounds at the bases  Abdomen: Soft, nontender, nondistended, bowel sounds present  Musculoskeletal: +1 bilateral pedal edema noted, right upper extremity swelling  Skin: Normal  Psychiatry: Normal mood    Data Reviewed: CBC: Recent Labs  Lab 03/18/19 0003 03/19/19 0516 03/20/19 0519  WBC 6.4 6.0 6.6  NEUTROABS 5.4 3.9 4.3  HGB 10.5* 9.2* 8.6*  HCT 30.4* 27.2* 25.8*  MCV 89.1 91.6 91.5  PLT 128* 115* 381*   Basic Metabolic Panel: Recent Labs  Lab 03/18/19 0551 03/18/19 1318 03/18/19 2041 03/19/19 0516 03/20/19 0519  NA 124* 122* 124* 126* 122*  K 4.0 4.0 4.3 4.2 4.6  CL 88* 89* 89* 91* 87*  CO2 25 24 26 25 27   GLUCOSE 136* 109* 103* 99 120*  BUN 18 16 16 17  24*  CREATININE 1.94* 1.88* 1.93* 1.99* 2.00*  CALCIUM 9.9 9.6 9.7 9.6 9.2  MG 1.7  --   --   --   --    GFR: Estimated Creatinine Clearance: 25.4 mL/min (A) (by C-G formula based on SCr of 2 mg/dL (H)). Liver Function Tests: Recent Labs  Lab 03/18/19 0003  AST 29  ALT 16  ALKPHOS 47  BILITOT 1.4*  PROT 5.8*  ALBUMIN 3.5   Recent Labs  Lab 03/18/19 0003  LIPASE 21   No results for input(s): AMMONIA in the last  168 hours. Coagulation Profile: No results for input(s): INR, PROTIME in the last 168 hours. Cardiac Enzymes: No results for input(s): CKTOTAL, CKMB, CKMBINDEX, TROPONINI in the last 168 hours. BNP (last 3 results) No results for input(s): PROBNP in the last 8760 hours. HbA1C: Recent Labs    03/18/19 0551  HGBA1C 5.2   CBG: Recent Labs  Lab 03/19/19 1146 03/19/19 1628 03/19/19 2124 03/20/19 0619 03/20/19 1110  GLUCAP 145* 114* 167* 113* 140*   Lipid Profile: No results for input(s): CHOL, HDL, LDLCALC, TRIG, CHOLHDL, LDLDIRECT in the last 72 hours. Thyroid Function Tests: Recent Labs    03/18/19 0551  TSH 5.807*  FREET4 1.41*   Anemia Panel: No results for input(s): VITAMINB12, FOLATE, FERRITIN, TIBC, IRON, RETICCTPCT in the last 72 hours. Urine analysis:    Component Value Date/Time   COLORURINE YELLOW 03/18/2019 0003   APPEARANCEUR CLEAR 03/18/2019 0003   LABSPEC 1.009 03/18/2019 0003   PHURINE 8.0 03/18/2019 0003   GLUCOSEU NEGATIVE 03/18/2019 0003   GLUCOSEU NEGATIVE 07/27/2017 1517  HGBUR NEGATIVE 03/18/2019 0003   BILIRUBINUR NEGATIVE 03/18/2019 0003   BILIRUBINUR negative 09/04/2015 1326   KETONESUR 5 (A) 03/18/2019 0003   PROTEINUR >=300 (A) 03/18/2019 0003   UROBILINOGEN 0.2 07/27/2017 1517   NITRITE NEGATIVE 03/18/2019 0003   LEUKOCYTESUR NEGATIVE 03/18/2019 0003   Sepsis Labs: @LABRCNTIP (procalcitonin:4,lacticidven:4)  ) Recent Results (from the past 240 hour(s))  Urine culture     Status: Abnormal   Collection Time: 03/18/19  2:29 AM   Specimen: Urine, Random  Result Value Ref Range Status   Specimen Description URINE, RANDOM  Final   Special Requests NONE  Final   Culture (A)  Final    <10,000 COLONIES/mL INSIGNIFICANT GROWTH Performed at [facility], [place] [address], [place], [place] [address]    Report Status 03/19/2019 FINAL  Final  SARS CORONAVIRUS 2 (TAT 6-24 HRS) Nasopharyngeal Nasopharyngeal Swab     Status: None    Collection Time: 03/18/19  4:47 AM   Specimen: Nasopharyngeal Swab  Result Value Ref Range Status   SARS Coronavirus 2 NEGATIVE NEGATIVE Final    Comment: (NOTE) SARS-CoV-2 target nucleic acids are NOT DETECTED. The SARS-CoV-2 RNA is generally detectable in upper and lower respiratory specimens during the acute phase of infection. Negative results do not preclude SARS-CoV-2 infection, do not rule out co-infections with other pathogens, and should not be used as the sole basis for treatment or other patient management decisions. Negative results must be combined with clinical observations, patient history, and epidemiological information. The expected result is Negative. Fact Sheet for Patients: SugarRoll.be Fact Sheet for Healthcare Providers: https://www.woods-mathews.com/ This test is not yet approved or cleared by the [place] FDA and  has been authorized for detection and/or diagnosis of SARS-CoV-2 by FDA under an Emergency Use Authorization (EUA). This EUA will remain  in effect (meaning this test can be used) for the duration of the COVID-19 declaration under Section 56 4(b)(1) of the Act, 21 U.S.C. section 360bbb-3(b)(1), unless the authorization is terminated or revoked sooner. Performed at [facility], [place] [address], [place], [place] [address]       Studies: No results found.  Scheduled Meds: . amLODipine  5 mg Oral Daily  . aspirin EC  81 mg Oral Daily  . atorvastatin  20 mg Oral Daily  . diclofenac sodium  2 g Topical QID  . feeding supplement (GLUCERNA SHAKE)  237 mL Oral TID BM  . ferrous sulfate  325 mg Oral Daily  . fluticasone  2 spray Each Nare Daily  . heparin  5,000 Units Subcutaneous Q8H  . hydrALAZINE  50 mg Oral BID  . insulin aspart  0-9 Units Subcutaneous TID WC  . levothyroxine  88 mcg Oral Q0600  . loratadine  10 mg Oral Daily  . lubiprostone  24 mcg Oral BID  . oxybutynin  5 mg Oral Daily   . pantoprazole  40 mg Oral Daily  . senna-docusate  1 tablet Oral QHS  . sodium chloride flush  3 mL Intravenous Q12H  . tamoxifen  20 mg Oral Daily    Continuous Infusions:   LOS: 2 days     Alma Friendly, MD [facility]  If 7PM-7AM, please contact night-coverage [email or web address] 03/20/2019, 12:48 PM

## 2019-03-20 NOTE — Evaluation (Signed)
Clinical/Bedside Swallow Evaluation Patient Details  Name: Stacy Walls MRN: [id number] Date of Birth: 11/19/1932  Today's Date: 03/20/2019 Time: SLP Start Time (ACUTE ONLY): 2831 SLP Stop Time (ACUTE ONLY): 0844 SLP Time Calculation (min) (ACUTE ONLY): 12 min  Past Medical History:  Past Medical History:  Diagnosis Date  . Acute cholecystitis   . Allergic rhinitis   . Anemia   . Arthritis   . [facility])    Right breast  . Chest pain   . Chronic renal insufficiency    followed by Dr Justin Mend stage 3  . Clostridium difficile colitis   . Constipation   . Coronary atherosclerosis of native coronary vessel   . Cough   . Depression    situational - husband died 05/21/2018  . Diabetes mellitus   . Dizziness   . DM2 (diabetes mellitus, type 2) ([place])   . Dyshidrosis   . Family history of adverse reaction to anesthesia    daughter - nausea  . GERD (gastroesophageal reflux disease)   . Gout   . HLD (hyperlipidemia)   . HTN (hypertension)   . Hypothyroidism   . Neck mass   . Neck pain   . Osteoporosis   . Routine general medical examination at a health care facility   . Secondary hyperparathyroidism (of renal origin)   . Urinary incontinence   . Urinary tract infection    Past Surgical History:  Past Surgical History:  Procedure Laterality Date  . bone density  08/21/05  . BREAST LUMPECTOMY WITH RADIOACTIVE SEED AND AXILLARY LYMPH NODE DISSECTION Right 06/07/2018   Procedure: RIGHT BREAST LUMPECTOMY WITH BRACKETED RADIOACTIVE SEEDS AND RIGHT COMPLETE AXILLARY LYMPH NODE DISSECTION;  Surgeon: Fanny Skates, MD;  Location: [facility];  Service: General;  Laterality: Right;  . C-section (other)  1977  . CHOLECYSTECTOMY    . ELECTROCARDIOGRAM  02/01/06  . L ankle surgery Left 1988  . total left knee Left 2007   HPI:  Pt is a 44 t/o female with PMH of R breast CA, CKD III, DM2, HTN, depression, anxiety, UTI, presenting to ED for evaluation of malaise, nausea, fatigue and  headache. CT negative, COVID negative. Found with hyponatremia.    Assessment / Plan / Recommendation Clinical Impression  Pt was encountered awake/alert with breakfast meal tray in front of her.  She reported that she has been having difficulty swallowing food for the past few months.  Oral mech exam was remarkable for reduced lingual ROM and missing dentition.  Pt was seen with trials of thin liquid, puree, and regular solids and she presents with minimal oral dysphagia and suspected functional pharyngeal phase of the swallow.  She exhibited good bolus acceptance, timely AP transport, and consistent hyolaryngeal elevation/excursion to observation and palpation with all trials.  Pt presented with mildly prolonged mastication of regular solid trials; however, mastication was effective and pt independently cleared trace oral residue with a liquid wash. Oropharyngeal swallow function does not appear related to pt's decreased po intake, rather pt reported that reduced sense of taste has had the biggest impact on her po intake.  Recommend Dysphagia 3 (soft) solids and thin liquids with the following precautions: 1) Small bites/sips 2) Slow rate of intake 3) Sit upright 90 degrees.  SLP will briefly f/u to monitor diet tolerance per POC.    SLP Visit Diagnosis: Dysphagia, oral phase (R13.11)    Aspiration Risk  No limitations    Diet Recommendation Dysphagia 3 (Mech soft);Thin liquid   Liquid Administration  via: Cup;Straw Medication Administration: Whole meds with puree Supervision: Patient able to self feed Compensations: Slow rate;Small sips/bites Postural Changes: Seated upright at 90 degrees    Other  Recommendations Oral Care Recommendations: Oral care BID   Follow up Recommendations None      Frequency and Duration min 1 x/week  2 weeks       Prognosis Prognosis for Safe Diet Advancement: Good      Swallow Study   General HPI: Pt is a 40 t/o female with PMH of R breast CA, CKD III,  DM2, HTN, depression, anxiety, UTI, presenting to ED for evaluation of malaise, nausea, fatigue and headache. CT negative, COVID negative. Found with hyponatremia.  Type of Study: Bedside Swallow Evaluation Previous Swallow Assessment: none Diet Prior to this Study: Regular;Thin liquids Temperature Spikes Noted: No Respiratory Status: Room air History of Recent Intubation: No Behavior/Cognition: Alert;Cooperative;Pleasant mood Oral Cavity Assessment: Within Functional Limits Oral Care Completed by SLP: No Oral Cavity - Dentition: Missing dentition Vision: Functional for self-feeding Self-Feeding Abilities: Needs set up Patient Positioning: Upright in bed Baseline Vocal Quality: Normal Volitional Swallow: Able to elicit    Oral/Motor/Sensory Function Overall Oral Motor/Sensory Function: Mild impairment Facial ROM: Within Functional Limits Facial Symmetry: Within Functional Limits Facial Sensation: Within Functional Limits Lingual ROM: Reduced right;Reduced left Lingual Symmetry: Within Functional Limits   Ice Chips Ice chips: Not tested   Thin Liquid Thin Liquid: Within functional limits Presentation: Straw    Nectar Thick Nectar Thick Liquid: Not tested   Honey Thick Honey Thick Liquid: Not tested   Puree Puree: Within functional limits Presentation: Spoon   Solid     Solid: Impaired Presentation: Self Fed Oral Phase Impairments: Impaired mastication Oral Phase Functional Implications: Impaired mastication     Colin Mulders., M.S., CCC-SLP Acute Rehabilitation Services Office: [phone]  [place] 03/20/2019,9:01 AM

## 2019-03-20 NOTE — Progress Notes (Signed)
Patient's daughter called Palliative Care requesting that we see her mother. She expressed multiple concerns and has questions regarding care plan that include:  -her moms worsening memory loss  -generalized weakness  -poor appetite, feels related to depression  -need for support and care at home, they do not want SNF  -she has several medical questions about her mothers heart failure management, her very low sodium levels and her kidney function.  -she also expressed concerns about medication interactions- they think her condition got worse when her Valsartan was stopped about 3 weeks ago and she was started on amlodipine.  I spoke with her daughter and told her I would convey her questions and concerns and request a consult from the Attending hospitalist at her request.  Please let us know how we can support the care of this patient.   Lane Hacker, DO Palliative Medicine

## 2019-03-20 NOTE — Progress Notes (Signed)
Tele order will expire at 2200 tonight , MD [place] notified.

## 2019-03-20 NOTE — Plan of Care (Signed)

## 2019-03-20 NOTE — Progress Notes (Addendum)
Initial Nutrition Assessment  DOCUMENTATION CODES:   Morbid obesity  INTERVENTION:  Ensure Enlive BID (each supplement provides 350 calories and 20 grams of protein)  Magic cup with meals (each supplement provides 250 calories and 9 grams of protein)  Liberalize diet  NUTRITION DIAGNOSIS:   Inadequate oral intake related to poor appetite, nausea as evidenced by per patient/family report.  GOAL:   Patient will meet greater than or equal to 90% of their needs  MONITOR:   PO intake, Supplement acceptance, Diet advancement, Labs, Weight trends, Skin, I & O's  REASON FOR ASSESSMENT:   Consult Assessment of nutrition requirement/status, Poor PO  ASSESSMENT:   85 y.o. female with medical history significant for cancer of the right breast, chronic kidney disease stage III, hypertension, type 2 diabetes mellitus, obese class III,  GERD, CAD, hyperparathyroidism secondary to CKD, depression, anxiety, and recent UTI. Admitted for hyponatremia.  Pt reports poor appetite and some nausea since admission, but had a good appetite PTA. Reported only wanting to eat the applesauce that was given to her and States that she wishes she could have more "flavor" in her food in the hospital. Reported that she enjoyed the ensure that the nurse gave her and was able to finish the entire supplement. Pt has PO intake of 5-50% since admission with an average of 31% PO intake since admission.   Attests to having a hard time swallowing large bites of food.    Lives at home with her grandson who is not home much, so she cooks for herself. Usually only eats two meals per day which consists of eggs and grits for breakfast or lunch and some chicken with vegetables for dinner.   Pt states that her UBW is around 240#, she reports this as her dry weight, and that she weighs herself daily in the AM. Pt is wt stable according to documented wt history.   Labs reviewed: CBG: 113-167; A1c 5.2; Sodium 122(L); chloride  87(L); Creat 2.00(H) Medications reviewed include: ferrous sulfate; heparin; insulin; synthroid; lasix  NUTRITION - FOCUSED PHYSICAL EXAM:    Most Recent Value  Orbital Region  No depletion  Upper Arm Region  No depletion  Thoracic and Lumbar Region  No depletion  Buccal Region  No depletion  Temple Region  No depletion  Clavicle Bone Region  No depletion  Clavicle and Acromion Bone Region  No depletion  Scapular Bone Region  No depletion  Dorsal Hand  No depletion  Patellar Region  No depletion  Anterior Thigh Region  No depletion  Posterior Calf Region  No depletion  Edema (RD Assessment)  Mild  Hair  Reviewed  Eyes  Reviewed  Mouth  Reviewed  Skin  Reviewed  Nails  Reviewed       Diet Order:   Diet Order            DIET DYS 3 Room service appropriate? Yes with Assist; Fluid consistency: Thin; Fluid restriction: 1500 mL Fluid  Diet effective now              EDUCATION NEEDS:   Education needs have been addressed  Skin:  Skin Assessment: Reviewed RN Assessment  Last BM:  unknown  Height:   Ht Readings from Last 1 Encounters:  03/18/19 5\' 2"  (1.575 m)    Weight:   Wt Readings from Last 1 Encounters:  03/20/19 113.4 kg    Ideal Body Weight:  50 kg  BMI:  Body mass index is 45.73 kg/m.  Estimated  Nutritional Needs:   Kcal:  1700-1900  Protein:  85-95 grams  Fluid:  </= 1.5 L  Meda Klinefelter, Dietetic Intern

## 2019-03-20 NOTE — Progress Notes (Signed)
Full consult note to follow. Patient complaining of abdominal pain and cramping that kept her from sleeping last PM, states things got bad for her after she had a fall on Nov 14th- prior to that time she was doing "great" especially given that she finished radiation for breast cancer less than 3 months ago. Issues with constipation, poor appetite, and weight loss. Family also tell me that she has not been walking very well since the fall.Had multiple episodes of vomiting on day of admission- possibly after taking Levaquin 750 mg that she had been prescribed for a UTI. Given her history of cancer, constipation, anemia, a recent fall and abdominal pain will obtain CT to rule out unexpected metastatic disease, possible occult pelvic fracture or compression fractures.  Scheduled Tylenol TID, Low dose trazadone trial tonight for sleep. Discontinued amitiza, started dulcolax. Stopped ditropan. Stopped Claritin. Concern for polypharmacy at home. I reviewed list of meds from a visit with her PCP and they do not match our records. Patient tells me she was getting confused with all of the different medications she has been prescribed in the past couple of weeks.  Will address goals of care as diagnostic information becomes available and do ACP prior to discharge. Will support patient and family desire to avoid nursing facilities after discharge. I encouraged mobility as much as possible.  Lane Hacker, DO Palliative Medicine [phone]

## 2019-03-20 NOTE — TOC Initial Note (Signed)
Transition of Care [facility]) - Initial/Assessment Note    Patient Details  Name: Stacy Walls MRN: [id number] Date of Birth: 01/28/1933  Transition of Care [facility]) CM/SW Contact:    Gelene Mink, [place] Phone Number: 03/20/2019, 4:14 PM  Clinical Narrative:                  CSW met with the patient and her daughter at bedside. CSW introduced herself and explained her role. CSW shared therapy recommendation. Patient and her daughter declined SNF. Patient doesn't want rehabilitation at a skilled nursing facility. They inquired about inpatient rehab, CSW explained process and shared she can have the doctor put in the consultation order. Patient has been active with [name] earlier this year. If they can't get CIR then they want Home health and to continue with [facility].   Patient lives with her grandson but patient's adult children are able to assist with supervision. Patient active with her PCP and SW at the office is assisting with applying for medicaid. Patient did not disclose what DME she had at home.   CSW will continue to follow and assist with disposition planning. CSW followed up with MD about placing CIR order.   Expected Discharge Plan: (CIR vs. HH) Barriers to Discharge: Ship broker, Continued Medical Work up   Patient Goals and CMS Choice Patient states their goals for this hospitalization and ongoing recovery are:: Pt wants to feel better CMS Medicare.gov Compare Post Acute Care list provided to:: Patient Choice offered to / list presented to : Patient, Adult Children  Expected Discharge Plan and Services Expected Discharge Plan: (CIR vs. HH) In-house Referral: Clinical Social Work   Post Acute Care Choice: (To be determined) Living arrangements for the past 2 months: Single Family Home                                      Prior Living Arrangements/Services Living arrangements for the past 2 months: Single Family Home Lives with:: Self,  Adult Children Patient language and need for interpreter reviewed:: No Do you feel safe going back to the place where you live?: Yes      Need for Family Participation in Patient Care: No (Comment) Care giver support system in place?: Yes (comment) Current home services: DME Criminal Activity/Legal Involvement Pertinent to Current Situation/Hospitalization: No - Comment as needed  Activities of Daily Living Home Assistive Devices/Equipment: Cane (specify quad or straight) ADL Screening (condition at time of admission) Patient's cognitive ability adequate to safely complete daily activities?: No Is the patient deaf or have difficulty hearing?: No Does the patient have difficulty seeing, even when wearing glasses/contacts?: No Does the patient have difficulty concentrating, remembering, or making decisions?: Yes Patient able to express need for assistance with ADLs?: No Does the patient have difficulty dressing or bathing?: Yes Independently performs ADLs?: Yes (appropriate for developmental age) Does the patient have difficulty walking or climbing stairs?: Yes Weakness of Legs: Both Weakness of Arms/Hands: Both  Permission Sought/Granted Permission sought to share information with : Case Manager Permission granted to share information with : Yes, Verbal Permission Granted     Permission granted to share info w AGENCY: HH        Emotional Assessment Appearance:: Appears stated age Attitude/Demeanor/Rapport: Engaged Affect (typically observed): Unable to Assess Orientation: : Oriented to Self, Oriented to Place, Oriented to  Time, Oriented to Situation Alcohol / Substance Use: Not  Applicable Psych Involvement: No (comment)  Admission diagnosis:  Hyponatremia [E87.1] AKI (acute kidney injury) ([place]) [N17.9] Patient Active Problem List   Diagnosis Date Noted  . Hyponatremia 03/18/2019  . Acute renal failure superimposed on stage 3 chronic kidney disease ([place]) 03/18/2019  .  Cancer of overlapping sites of right female breast ([place]) 06/07/2018  . Cancer of central portion of right female breast ([place]) 04/08/2018  . Cough 10/11/2017  . Abnormal endometrial ultrasound 08/03/2017  . Chronic scapular pain 08/02/2017  . Pelvic pain 07/27/2017  . Headache 05/05/2017  . Osteoarthritis 07/29/2016  . UTI (urinary tract infection) 09/04/2015  . Acute sinus infection 07/31/2015  . Screening for breast cancer 01/31/2013  . Diabetes mellitus with renal manifestation ([place]) 08/24/2012  . Iron deficiency anemia, unspecified 01/27/2012  . Screening examination for infectious disease 01/27/2012  . Encounter for long-term (current) use of other medications 01/12/2011  . OTITIS EXTERNA 03/04/2010  . Shoulder pain, left 12/02/2009  . Pancytopenia ([place]) 10/09/2009  . CLOSTRIDIUM DIFFICILE COLITIS 08/23/2009  . CAD, NATIVE VESSEL 08/23/2009  . CKD (chronic kidney disease), stage III ([place]) 08/23/2009  . Acute cholecystitis 08/15/2009  . Dyshidrosis 04/08/2009  . DIZZINESS 07/03/2008  . Secondary renal hyperparathyroidism ([place]) 06/15/2007  . Hypothyroidism 04/20/2007  . NECK PAIN 04/20/2007  . NECK MASS 04/05/2007  . GERD 03/09/2007  . Dyslipidemia 09/20/2006  . GOUT 09/20/2006  . Essential hypertension 09/20/2006  . ALLERGIC RHINITIS 09/20/2006  . Osteoporosis 09/20/2006  . URINARY INCONTINENCE 09/20/2006   PCP:  Sonia Side., FNP Pharmacy:   [facility] [place], [place] - [address] AT [address] [place] [address] Phone: [phone] Fax: [place], [place] [facility] [address] [place] [address] [place] [address] Phone: [phone] Fax: [phone]     Social Determinants of Health ([place]) Interventions    Readmission Risk Interventions No flowsheet data found.

## 2019-03-20 NOTE — Progress Notes (Signed)
Nephrologist ordered urine osmolality. Purewick canister emptied before I saw the order, awaiting for patient to urinate for me to send the specimen to lab.Neohrologist aware.  Will continue to monitor.

## 2019-03-21 ENCOUNTER — Inpatient Hospital Stay (HOSPITAL_COMMUNITY): Payer: Medicare Other

## 2019-03-21 LAB — VITAMIN B12: Vitamin B-12: 1835 pg/mL — ABNORMAL HIGH (ref 180–914)

## 2019-03-21 LAB — BASIC METABOLIC PANEL
Anion gap: 8 (ref 5–15)
Anion gap: 13 (ref 5–15)
BUN: 25 mg/dL — ABNORMAL HIGH (ref 8–23)
BUN: 28 mg/dL — ABNORMAL HIGH (ref 8–23)
CO2: 26 mmol/L (ref 22–32)
CO2: 19 mmol/L — ABNORMAL LOW (ref 22–32)
Calcium: 9.5 mg/dL (ref 8.9–10.3)
Calcium: 9.6 mg/dL (ref 8.9–10.3)
Chloride: 90 mmol/L — ABNORMAL LOW (ref 98–111)
Chloride: 93 mmol/L — ABNORMAL LOW (ref 98–111)
Creatinine, Ser: 2.03 mg/dL — ABNORMAL HIGH (ref 0.44–1.00)
Creatinine, Ser: 2.11 mg/dL — ABNORMAL HIGH (ref 0.44–1.00)
GFR calc Af Amer: 26 mL/min — ABNORMAL LOW (ref >60)
GFR calc Af Amer: 24 mL/min — ABNORMAL LOW (ref >60)
GFR calc non Af Amer: 22 mL/min — ABNORMAL LOW (ref >60)
GFR calc non Af Amer: 21 mL/min — ABNORMAL LOW (ref >60)
Glucose, Bld: 101 mg/dL — ABNORMAL HIGH (ref 70–99)
Glucose, Bld: 143 mg/dL — ABNORMAL HIGH (ref 70–99)
Potassium: 4.6 mmol/L (ref 3.5–5.1)
Potassium: 4.9 mmol/L (ref 3.5–5.1)
Sodium: 124 mmol/L — ABNORMAL LOW (ref 135–145)
Sodium: 125 mmol/L — ABNORMAL LOW (ref 135–145)

## 2019-03-21 LAB — GLUCOSE, CAPILLARY
Glucose-Capillary: 117 mg/dL — ABNORMAL HIGH (ref 70–99)
Glucose-Capillary: 114 mg/dL — ABNORMAL HIGH (ref 70–99)
Glucose-Capillary: 145 mg/dL — ABNORMAL HIGH (ref 70–99)
Glucose-Capillary: 145 mg/dL — ABNORMAL HIGH (ref 70–99)

## 2019-03-21 LAB — TYPE AND SCREEN
ABO/RH(D): O POS
Antibody Screen: NEGATIVE

## 2019-03-21 LAB — ABO/RH: ABO/RH(D): O POS

## 2019-03-21 LAB — CBC WITH DIFFERENTIAL/PLATELET
Abs Immature Granulocytes: 0.03 10*3/uL (ref 0.00–0.07)
Basophils Absolute: 0 10*3/uL (ref 0.0–0.1)
Basophils Relative: 1 %
Eosinophils Absolute: 0.2 10*3/uL (ref 0.0–0.5)
Eosinophils Relative: 3 %
HCT: 26.2 % — ABNORMAL LOW (ref 36.0–46.0)
Hemoglobin: 8.8 g/dL — ABNORMAL LOW (ref 12.0–15.0)
Immature Granulocytes: 1 %
Lymphocytes Relative: 22 %
Lymphs Abs: 1.2 10*3/uL (ref 0.7–4.0)
MCH: 30.8 pg (ref 26.0–34.0)
MCHC: 33.6 g/dL (ref 30.0–36.0)
MCV: 91.6 fL (ref 80.0–100.0)
Monocytes Absolute: 0.9 10*3/uL (ref 0.1–1.0)
Monocytes Relative: 17 %
Neutro Abs: 3 10*3/uL (ref 1.7–7.7)
Neutrophils Relative %: 56 %
Platelets: 111 10*3/uL — ABNORMAL LOW (ref 150–400)
RBC: 2.86 MIL/uL — ABNORMAL LOW (ref 3.87–5.11)
RDW: 12.5 % (ref 11.5–15.5)
WBC: 5.3 10*3/uL (ref 4.0–10.5)
nRBC: 0 % (ref 0.0–0.2)

## 2019-03-21 LAB — FERRITIN: Ferritin: 1144 ng/mL — ABNORMAL HIGH (ref 11–307)

## 2019-03-21 LAB — IRON AND TIBC
Iron: 44 ug/dL (ref 28–170)
Saturation Ratios: 31 % (ref 10.4–31.8)
TIBC: 141 ug/dL — ABNORMAL LOW (ref 250–450)
UIBC: 97 ug/dL

## 2019-03-21 LAB — T3, FREE: T3, Free: 1.6 pg/mL — ABNORMAL LOW (ref 2.0–4.4)

## 2019-03-21 LAB — FOLATE: Folate: 15.1 ng/mL (ref >5.9)

## 2019-03-21 MED ORDER — MAGIC MOUTHWASH
10.0000 mL | Freq: Three times a day (TID) | ORAL | Status: DC
  Administered 2019-03-21 – 2019-03-22 (×4): 10 mL via ORAL
  Administered 2019-03-23: 5 mL via ORAL
  Administered 2019-03-23 (×2): 10 mL via ORAL
  Administered 2019-03-24: 5 mL via ORAL
  Administered 2019-03-24 – 2019-03-27 (×9): 10 mL via ORAL
  Filled 2019-03-21 (×21): qty 10

## 2019-03-21 MED ORDER — TRAZODONE HCL 50 MG PO TABS
50.0000 mg | ORAL_TABLET | Freq: Once | ORAL | Status: AC
  Administered 2019-03-21: 50 mg via ORAL
  Filled 2019-03-21: qty 1

## 2019-03-21 MED ORDER — LACTULOSE 10 GM/15ML PO SOLN: 10.0000 g | Freq: Once | ORAL | Status: DC

## 2019-03-21 MED ORDER — BISACODYL 10 MG RE SUPP: 10.0000 mg | Freq: Once | RECTAL | Status: DC

## 2019-03-21 MED ORDER — FUROSEMIDE 20 MG PO TABS
20.0000 mg | ORAL_TABLET | Freq: Every day | ORAL | Status: DC
  Administered 2019-03-21 – 2019-03-27 (×7): 20 mg via ORAL
  Filled 2019-03-21 (×7): qty 1

## 2019-03-21 MED ORDER — SODIUM CHLORIDE 0.9 % IV SOLN
INTRAVENOUS | Status: DC
  Administered 2019-03-21 – 2019-03-23 (×4): via INTRAVENOUS

## 2019-03-21 NOTE — Progress Notes (Signed)
PROGRESS NOTE  Stacy Walls SEG:[id number] DOB: 08-22-1932 DOA: 03/17/2019 PCP: Sonia Side., FNP  HPI/Recap of past 24 hours: HPI from Dr Tamala Fothergill Stacy Walls is a 85 y.o. female with medical history significant for cancer of the right breast, chronic kidney disease stage III, hypertension, type 2 diabetes mellitus, depression, anxiety, and recent UTI, now presenting to the ED for evaluation of malaise, nausea, fatigue, and headache.  Patient reports that she was recently started on Levaquin for a UTI and subsequently developed the above symptoms. Pt denies fevers, chills, cough, shortness of breath, or chest pain, dysuria. In the ED, VSS, EKG features a sinus rhythm with PVCs and QTc interval of 507 ms, head CT is negative for acute intracranial abnormality, labs showed sodium of 124 and creatinine of 2.16, up from 1.68 earlier this month. High-sensitivity troponin is normal.  Urinalysis notable for proteinuria.  Urine was sent for culture. COVID-19 screening test pending.     Today, met patient at bedside with PT, was trying to stand up from her bed.  Denies any new complaints, still with generalized weakness.  Denies any chest pain, abdominal pain, nausea/vomiting, fever/chills.  Assessment/Plan: Principal Problem:   Hyponatremia Active Problems:   Hypothyroidism   Essential hypertension   CAD, NATIVE VESSEL   Diabetes mellitus with renal manifestation (HCC)   Cancer of central portion of right female breast (HCC)   Acute renal failure superimposed on stage 3 chronic kidney disease ([place])  Acute on chronic hyponatremia Ongoing  Noted to have sodium of 124, down from 127 on 02/27/19 (baseline around 130s) Nephrology on board, appreciate recs, continue IV fluids plus Lasix Continue to hold HCTZ Frequent BMP checks  Chronic diastolic HF Appears euvolemic BNP 73.6 Chest x-ray with no edema Echo with 60 to 65% EF, grade 1 diastolic dysfunction Continue  Lasix as per nephrology  CKD III Currently around baseline creatinine  Nephrology on board Continue to hold HCTZ Daily BMP  ??UTI Was started on Levaquin by PCP, took only 1 dose PTA UA only positive for proteinuria UC <10,000 insignificant growth  Prolonged QT interval Resolved QT interval is 507 on presentation, repeat EKG with normal QTC Recent use of Levaquin Continue telemetry  Anemia of chronic kidney disease/malignancy Hemoglobin slowly trending down, no obvious signs of bleeding Anemia panel iron 44, TIBC 141, sats 31, ferritin 1144, vitamin B12 1835 FOBT pending Type and screen done Daily CBC  Hypertension Continue amlodipine, hydralazine (son refusing for pt to have amlodipine, as they relate recent changes to beginning amlodipine) Continue to hold hydrochlorothiazide, losartan  Type 2 diabetes mellitus Last A1c 5.2 SSI, Accu-Cheks, hypoglycemic protocol Hold home glipizide, Januvia  Hypothyroidism TSH 5.8, free T4 1.41 elevated, repeat TSH this admission continue to trend up T3 pending Continue Synthroid  Hyperlipidemia Continue Lipitor  GERD Continue PPI  Breast cancer, right   S/p lumpectomy, LN dissection, and radiation   Continue tamoxifen, continue oncology follow-up   Morbid obesity Lifestyle modification advised       Malnutrition Type:  Nutrition Problem: Inadequate oral intake Etiology: poor appetite, nausea   Malnutrition Characteristics:  Signs/Symptoms: per patient/family report   Nutrition Interventions:  Interventions: Ensure Enlive (each supplement provides 350kcal and 20 grams of protein), Snacks    Estimated body mass index is 45.73 kg/m as calculated from the following:   Height as of this encounter: 5' 2"  (1.575 m).   Weight as of this encounter: 113.4 kg.     Code Status: Full  Family Communication: Discussed with daughter on 03/20/2019  Disposition Plan: Possible CIR, if does not qualify, family wants  patient home   Consultants:  Nephrology  Palliative (Dr. Hilma Favors is a friend of the family)  Procedures:  None  Antimicrobials:  None  DVT prophylaxis: Heparin   Objective: Vitals:   03/20/19 2020 03/21/19 0447 03/21/19 0913 03/21/19 1115  BP: (!) 146/54 (!) 152/61 (!) 143/52 (!) 143/54  Pulse: 82 80  77  Resp: 18 20  16   Temp: 98.1 F (36.7 C) 97.9 F (36.6 C)  98.7 F (37.1 C)  TempSrc: Oral Oral  Oral  SpO2: 95% 98%  97%  Weight:  113.4 kg    Height:        Intake/Output Summary (Last 24 hours) at 03/21/2019 1720 Last data filed at 03/21/2019 1559 Gross per 24 hour  Intake 1270.34 ml  Output 1050 ml  Net 220.34 ml   Filed Weights   03/19/19 0444 03/20/19 0316 03/21/19 0447  Weight: 112.5 kg 113.4 kg 113.4 kg    Exam:   General: NAD, chronically ill-appearing  Cardiovascular: S1, S2 present  Respiratory:  Diminished breath sounds at the bases  Abdomen: Soft, nontender, nondistended, bowel sounds present  Musculoskeletal: No bilateral pedal edema noted, right upper extremity swelling noted  Skin: Normal  Psychiatry: Normal mood    Data Reviewed: CBC: Recent Labs  Lab 03/18/19 0003 03/19/19 0516 03/20/19 0519 03/21/19 0525  WBC 6.4 6.0 6.6 5.3  NEUTROABS 5.4 3.9 4.3 3.0  HGB 10.5* 9.2* 8.6* 8.8*  HCT 30.4* 27.2* 25.8* 26.2*  MCV 89.1 91.6 91.5 91.6  PLT 128* 115* 110* 419*   Basic Metabolic Panel: Recent Labs  Lab 03/18/19 0551  03/18/19 2041 03/19/19 0516 03/20/19 0519 03/20/19 1545 03/21/19 0525  NA 124*   < > 124* 126* 122* 124* 124*  K 4.0   < > 4.3 4.2 4.6 4.6 4.6  CL 88*   < > 89* 91* 87* 89* 90*  CO2 25   < > 26 25 27 24 26   GLUCOSE 136*   < > 103* 99 120* 130* 101*  BUN 18   < > 16 17 24* 22 25*  CREATININE 1.94*   < > 1.93* 1.99* 2.00* 2.00* 2.03*  CALCIUM 9.9   < > 9.7 9.6 9.2 9.5 9.5  MG 1.7  --   --   --   --   --   --   PHOS  --   --   --   --   --  2.4*  --    < > = values in this interval not displayed.     GFR: Estimated Creatinine Clearance: 25 mL/min (A) (by C-G formula based on SCr of 2.03 mg/dL (H)). Liver Function Tests: Recent Labs  Lab 03/18/19 0003 03/20/19 1545  AST 29  --   ALT 16  --   ALKPHOS 47  --   BILITOT 1.4*  --   PROT 5.8*  --   ALBUMIN 3.5 2.7*   Recent Labs  Lab 03/18/19 0003  LIPASE 21   No results for input(s): AMMONIA in the last 168 hours. Coagulation Profile: No results for input(s): INR, PROTIME in the last 168 hours. Cardiac Enzymes: No results for input(s): CKTOTAL, CKMB, CKMBINDEX, TROPONINI in the last 168 hours. BNP (last 3 results) No results for input(s): PROBNP in the last 8760 hours. HbA1C: No results for input(s): HGBA1C in the last 72 hours. CBG: Recent Labs  Lab  03/20/19 1110 03/20/19 1624 03/20/19 2104 03/21/19 0632 03/21/19 1115  GLUCAP 140* 121* 160* 117* 114*   Lipid Profile: No results for input(s): CHOL, HDL, LDLCALC, TRIG, CHOLHDL, LDLDIRECT in the last 72 hours. Thyroid Function Tests: Recent Labs    03/20/19 1229 03/20/19 1545  TSH 11.724*  --   FREET4  --  1.35*  T3FREE  --  1.6*   Anemia Panel: Recent Labs    03/21/19 0525  VITAMINB12 1,835*  FOLATE 15.1  FERRITIN 1,144*  TIBC 141*  IRON 44   Urine analysis:    Component Value Date/Time   COLORURINE YELLOW 03/18/2019 0003   APPEARANCEUR CLEAR 03/18/2019 0003   LABSPEC 1.009 03/18/2019 0003   PHURINE 8.0 03/18/2019 0003   GLUCOSEU NEGATIVE 03/18/2019 0003   GLUCOSEU NEGATIVE 07/27/2017 1517   HGBUR NEGATIVE 03/18/2019 0003   BILIRUBINUR NEGATIVE 03/18/2019 0003   BILIRUBINUR negative 09/04/2015 1326   KETONESUR 5 (A) 03/18/2019 0003   PROTEINUR >=300 (A) 03/18/2019 0003   UROBILINOGEN 0.2 07/27/2017 1517   NITRITE NEGATIVE 03/18/2019 0003   LEUKOCYTESUR NEGATIVE 03/18/2019 0003   Sepsis Labs: @LABRCNTIP (procalcitonin:4,lacticidven:4)  ) Recent Results (from the past 240 hour(s))  Urine culture     Status: Abnormal   Collection Time:  03/18/19  2:29 AM   Specimen: Urine, Random  Result Value Ref Range Status   Specimen Description URINE, RANDOM  Final   Special Requests NONE  Final   Culture (A)  Final    <10,000 COLONIES/mL INSIGNIFICANT GROWTH Performed at [facility], [place] [address], [place], [place] [address]    Report Status 03/19/2019 FINAL  Final  SARS CORONAVIRUS 2 (TAT 6-24 HRS) Nasopharyngeal Nasopharyngeal Swab     Status: None   Collection Time: 03/18/19  4:47 AM   Specimen: Nasopharyngeal Swab  Result Value Ref Range Status   SARS Coronavirus 2 NEGATIVE NEGATIVE Final    Comment: (NOTE) SARS-CoV-2 target nucleic acids are NOT DETECTED. The SARS-CoV-2 RNA is generally detectable in upper and lower respiratory specimens during the acute phase of infection. Negative results do not preclude SARS-CoV-2 infection, do not rule out co-infections with other pathogens, and should not be used as the sole basis for treatment or other patient management decisions. Negative results must be combined with clinical observations, patient history, and epidemiological information. The expected result is Negative. Fact Sheet for Patients: SugarRoll.be Fact Sheet for Healthcare Providers: https://www.woods-mathews.com/ This test is not yet approved or cleared by the [place] FDA and  has been authorized for detection and/or diagnosis of SARS-CoV-2 by FDA under an Emergency Use Authorization (EUA). This EUA will remain  in effect (meaning this test can be used) for the duration of the COVID-19 declaration under Section 56 4(b)(1) of the Act, 21 U.S.C. section 360bbb-3(b)(1), unless the authorization is terminated or revoked sooner. Performed at [facility], [place] [address], [place], [place] [address]       Studies: Ct Abdomen Pelvis Wo Contrast  Result Date: 03/21/2019 CLINICAL DATA:  Unintentional weight loss, abdominal pain, nausea, vomiting EXAM: CT  ABDOMEN AND PELVIS WITHOUT CONTRAST TECHNIQUE: Multidetector CT imaging of the abdomen and pelvis was performed following the standard protocol without IV contrast. COMPARISON:  08/01/2009 FINDINGS: Lower chest: Trace bilateral pleural effusions, right greater than left, with associated compressive atelectasis. Extensive postsurgical and posttreatment changes to the right breast including a 5.5 x 2.2 cm fluid collection adjacent to surgical clips which may represent postoperative seroma or hematoma versus an infected fluid  collection (series 3, image 1). Hepatobiliary: No focal liver abnormality is seen. Status post cholecystectomy. No biliary dilatation. Pancreas: Unremarkable. No pancreatic ductal dilatation or surrounding inflammatory changes. Spleen: Normal in size without focal abnormality. Adrenals/Urinary Tract: Adrenal glands are unremarkable. Kidneys are normal, without renal calculi, focal lesion, or hydronephrosis. Bladder is unremarkable. Stomach/Bowel: Stomach is within normal limits. No pericecal or periappendiceal inflammatory changes. Scattered colonic diverticulosis. No evidence of bowel wall thickening, distention, or inflammatory changes. Vascular/Lymphatic: Aortic atherosclerosis. No enlarged abdominal or pelvic lymph nodes. Reproductive: Uterus and bilateral adnexa are unremarkable. Other: No abdominopelvic ascites. Tiny fat containing umbilical hernia. Musculoskeletal: Degenerative disc disease of the lumbar spine most pronounced at L4-L5. No acute osseous findings. IMPRESSION: 1. No acute abdominopelvic findings. 2. Extensive postsurgical/posttreatment changes to the right breast including a 5.5 x 2.2 cm fluid collection adjacent to surgical clips which may represent postoperative seroma or hematoma. In the appropriate clinical setting, an infected fluid collection is also a consideration. Follow-up with outpatient diagnostic mammogram and possible ultrasound is recommended. 3. Trace bilateral  pleural effusions, right greater than left, with associated compressive atelectasis. 4. Aortic atherosclerosis. Aortic Atherosclerosis (ICD10-I70.0). Electronically Signed   By: Davina Poke M.D.   On: 03/21/2019 11:08    Scheduled Meds:  acetaminophen  650 mg Oral TID   aspirin EC  81 mg Oral Daily   atorvastatin  20 mg Oral Daily   bisacodyl  10 mg Oral QHS   diclofenac sodium  2 g Topical QID   feeding supplement (ENSURE ENLIVE)  237 mL Oral BID BM   ferrous sulfate  325 mg Oral Daily   fluticasone  2 spray Each Nare Daily   furosemide  20 mg Oral Daily   heparin  5,000 Units Subcutaneous Q8H   hydrALAZINE  50 mg Oral BID   insulin aspart  0-9 Units Subcutaneous TID WC   levothyroxine  88 mcg Oral Q0600   pantoprazole  40 mg Oral Daily   sodium chloride flush  3 mL Intravenous Q12H   tamoxifen  20 mg Oral Daily   traZODone  50 mg Oral QHS    Continuous Infusions:  sodium chloride 50 mL/hr at 03/21/19 1223     LOS: 3 days     Alma Friendly, MD [facility]  If 7PM-7AM, please contact night-coverage [email or web address] 03/21/2019, 5:20 PM

## 2019-03-21 NOTE — Progress Notes (Signed)
Patient ID: Stacy Walls, female   DOB: Sep 30, 1932, 85 y.o.   MRN: [id number] S: Was up and moving in the room with PT assistance.  Having trouble moving her bowels O:BP (!) 143/54 (BP Location: Left Arm)   Pulse 77   Temp 98.7 F (37.1 C) (Oral)   Resp 16   Ht 5\' 2"  (1.575 m)   Wt 113.4 kg   SpO2 97%   BMI 45.73 kg/m   Intake/Output Summary (Last 24 hours) at 03/21/2019 1314 Last data filed at 03/21/2019 0925 Gross per 24 hour  Intake 840 ml  Output 150 ml  Net 690 ml   Intake/Output: I/O last 3 completed shifts: In: 1440 [P.O.:1440] Out: 1800 [Urine:1800]  Intake/Output this shift:  Total I/O In: 240 [P.O.:240] Out: -  Weight change: 0.011 kg Gen: NAD CVS: no rub Resp: cta Abd: +BS, obese, NT Ext: +edema of RUE  Recent Labs  Lab 03/18/19 0003 03/18/19 0551 03/18/19 1318 03/18/19 2041 03/19/19 0516 03/20/19 0519 03/20/19 1545 03/21/19 0525  NA 124* 124* 122* 124* 126* 122* 124* 124*  K 3.9 4.0 4.0 4.3 4.2 4.6 4.6 4.6  CL 87* 88* 89* 89* 91* 87* 89* 90*  CO2 22 25 24 26 25 27 24 26   GLUCOSE 143* 136* 109* 103* 99 120* 130* 101*  BUN 18 18 16 16 17  24* 22 25*  CREATININE 2.16* 1.94* 1.88* 1.93* 1.99* 2.00* 2.00* 2.03*  ALBUMIN 3.5  --   --   --   --   --  2.7*  --   CALCIUM 10.2 9.9 9.6 9.7 9.6 9.2 9.5 9.5  PHOS  --   --   --   --   --   --  2.4*  --   AST 29  --   --   --   --   --   --   --   ALT 16  --   --   --   --   --   --   --    Liver Function Tests: Recent Labs  Lab 03/18/19 0003 03/20/19 1545  AST 29  --   ALT 16  --   ALKPHOS 47  --   BILITOT 1.4*  --   PROT 5.8*  --   ALBUMIN 3.5 2.7*   Recent Labs  Lab 03/18/19 0003  LIPASE 21   No results for input(s): AMMONIA in the last 168 hours. CBC: Recent Labs  Lab 03/18/19 0003 03/19/19 0516 03/20/19 0519 03/21/19 0525  WBC 6.4 6.0 6.6 5.3  NEUTROABS 5.4 3.9 4.3 3.0  HGB 10.5* 9.2* 8.6* 8.8*  HCT 30.4* 27.2* 25.8* 26.2*  MCV 89.1 91.6 91.5 91.6  PLT 128* 115* 110* 111*    Cardiac Enzymes: No results for input(s): CKTOTAL, CKMB, CKMBINDEX, TROPONINI in the last 168 hours. CBG: Recent Labs  Lab 03/20/19 1110 03/20/19 1624 03/20/19 2104 03/21/19 0632 03/21/19 1115  GLUCAP 140* 121* 160* 117* 114*    Iron Studies:  Recent Labs    03/21/19 0525  IRON 44  TIBC 141*  FERRITIN 1,144*   Studies/Results: Ct Abdomen Pelvis Wo Contrast  Result Date: 03/21/2019 CLINICAL DATA:  Unintentional weight loss, abdominal pain, nausea, vomiting EXAM: CT ABDOMEN AND PELVIS WITHOUT CONTRAST TECHNIQUE: Multidetector CT imaging of the abdomen and pelvis was performed following the standard protocol without IV contrast. COMPARISON:  08/01/2009 FINDINGS: Lower chest: Trace bilateral pleural effusions, right greater than left, with associated compressive atelectasis. Extensive postsurgical and posttreatment  changes to the right breast including a 5.5 x 2.2 cm fluid collection adjacent to surgical clips which may represent postoperative seroma or hematoma versus an infected fluid collection (series 3, image 1). Hepatobiliary: No focal liver abnormality is seen. Status post cholecystectomy. No biliary dilatation. Pancreas: Unremarkable. No pancreatic ductal dilatation or surrounding inflammatory changes. Spleen: Normal in size without focal abnormality. Adrenals/Urinary Tract: Adrenal glands are unremarkable. Kidneys are normal, without renal calculi, focal lesion, or hydronephrosis. Bladder is unremarkable. Stomach/Bowel: Stomach is within normal limits. No pericecal or periappendiceal inflammatory changes. Scattered colonic diverticulosis. No evidence of bowel wall thickening, distention, or inflammatory changes. Vascular/Lymphatic: Aortic atherosclerosis. No enlarged abdominal or pelvic lymph nodes. Reproductive: Uterus and bilateral adnexa are unremarkable. Other: No abdominopelvic ascites. Tiny fat containing umbilical hernia. Musculoskeletal: Degenerative disc disease of the  lumbar spine most pronounced at L4-L5. No acute osseous findings. IMPRESSION: 1. No acute abdominopelvic findings. 2. Extensive postsurgical/posttreatment changes to the right breast including a 5.5 x 2.2 cm fluid collection adjacent to surgical clips which may represent postoperative seroma or hematoma. In the appropriate clinical setting, an infected fluid collection is also a consideration. Follow-up with outpatient diagnostic mammogram and possible ultrasound is recommended. 3. Trace bilateral pleural effusions, right greater than left, with associated compressive atelectasis. 4. Aortic atherosclerosis. Aortic Atherosclerosis (ICD10-I70.0). Electronically Signed   By: Davina Poke M.D.   On: 03/21/2019 11:08   . acetaminophen  650 mg Oral TID  . aspirin EC  81 mg Oral Daily  . atorvastatin  20 mg Oral Daily  . bisacodyl  10 mg Oral QHS  . diclofenac sodium  2 g Topical QID  . feeding supplement (ENSURE ENLIVE)  237 mL Oral BID BM  . ferrous sulfate  325 mg Oral Daily  . fluticasone  2 spray Each Nare Daily  . furosemide  20 mg Oral Daily  . heparin  5,000 Units Subcutaneous Q8H  . hydrALAZINE  50 mg Oral BID  . insulin aspart  0-9 Units Subcutaneous TID WC  . levothyroxine  88 mcg Oral Q0600  . pantoprazole  40 mg Oral Daily  . sodium chloride flush  3 mL Intravenous Q12H  . tamoxifen  20 mg Oral Daily  . traZODone  50 mg Oral QHS    BMET    Component Value Date/Time   NA 124 (L) 03/21/2019 0525   K 4.6 03/21/2019 0525   CL 90 (L) 03/21/2019 0525   CO2 26 03/21/2019 0525   GLUCOSE 101 (H) 03/21/2019 0525   BUN 25 (H) 03/21/2019 0525   CREATININE 2.03 (H) 03/21/2019 0525   CREATININE 1.68 (H) 02/27/2019 0924   CALCIUM 9.5 03/21/2019 0525   CALCIUM 10.5 01/12/2011 1116   GFRNONAA 22 (L) 03/21/2019 0525   GFRNONAA 28 (L) 02/27/2019 0924   GFRAA 26 (L) 03/21/2019 0525   GFRAA 32 (L) 02/27/2019 0924   CBC    Component Value Date/Time   WBC 5.3 03/21/2019 0525   RBC 2.86  (L) 03/21/2019 0525   HGB 8.8 (L) 03/21/2019 0525   HGB 10.3 (L) 02/27/2019 0924   HCT 26.2 (L) 03/21/2019 0525   PLT 111 (L) 03/21/2019 0525   PLT 121 (L) 02/27/2019 0924   MCV 91.6 03/21/2019 0525   MCH 30.8 03/21/2019 0525   MCHC 33.6 03/21/2019 0525   RDW 12.5 03/21/2019 0525   LYMPHSABS 1.2 03/21/2019 0525   MONOABS 0.9 03/21/2019 0525   EOSABS 0.2 03/21/2019 0525   BASOSABS 0.0 03/21/2019 0525  Assessment/Plan: 1.  Hyponatremia- initially was likely combination of nausea, quinolone, HCTZ, and volume depletion.  Now with ongoing hyponatremia off of offending agents.  She does have some edema of RUE but no lower ext edema or presacral edema.  Evaluation is complicated by presence of CKD and her inability to fully dilute her urine.  Unfortunately repeat urine Na and osmolality were not performed at the time of this note despite being ordered 8 hours ago so difficult to assess renal handling of sodium.  She does have edema and was on lasix as an outpatient.  Will dose with po lasix and follow response.  Will also need to follow orthostatic vital signs to r/o volume depletion.  Her TSH is markedly elevated and awaiting free T3 level.   1. Repeat urine Na down to 18 and urine osmolality 208 and serum osmolality 261 consistent with an appropriate response in patient with CKD and possible volume depletion.   2. Will add IV NS along with lasix to help correct sodium levels. 3. Continue to follow sodium levels again this afternoon  2. Diastolic CHF- no lower ext or presacral edema.  Will resume lasix and follow 3. CKD stage 3- she is within her baseline Cr range.  Cont to hold ARB and HCTZ 4. Anemia of CKD- was on procrit monthly.  Now with significant drop.  Would guaiac stools and follow h/h.  redose ESA and follow. 5. HTN- holding ARB/HCTZ and amlodipine per son's request. 6. DM- per primary svc 7. Hypothyroidism- TSH up to 11.724  Donetta Potts, MD [facility] [phone]

## 2019-03-21 NOTE — Progress Notes (Signed)
Inpatient Rehabilitation Admissions Coordinator  Inpatient rehab consult received.  I met with patient and her son at bedside for rehab assessment. We discussed goals and expectations of an inpt rehab admit. Son states they will not agree to SNF. If not approved for CIR, they will take pt home and provide 24/7 care. I will begin insurance authorization with [facility] for a possible inpt rehab admit pending insurance approval. I explained to son and pt that I do not feel insurance will approve, but I will attempt authorization.  Danne Baxter, RN, MSN Rehab Admissions Coordinator [phone] 03/21/2019 1:41 PM

## 2019-03-21 NOTE — Progress Notes (Signed)
Physical Therapy Treatment Patient Details Name: Stacy Walls MRN: [id number] DOB: 10/02/1932 Today's Date: 03/21/2019    History of Present Illness Pt is a 8 t/o female with PMH of R breast CA, CKD III, DM2, HTN, depression, anxiety, UTI, presenting to ED for evaluation of malaise, nausea, fatigue and headache. CT negative, COVID negative. Found with hyponatremia.    PT Comments    Pt progressing with mobility. Able to ambulate short distance in room with RW, requiring up to [place] for mobility. Limited by generalized weakness and decreased activity tolerance. Pt reports she is open to SNF-level therapies. Continue to recommend SNF to maximize functional mobility and independence prior to return home. If pt to d/c home, will need 24/7 assist and [facility] services.    Follow Up Recommendations  SNF;Supervision/Assistance - 24 hour(if declines, will need HHPT and 24/7)     Equipment Recommendations  Rolling walker with 5" wheels;Other (comment)(bariatric 3in1)    Recommendations for Other Services       Precautions / Restrictions Precautions Precautions: Fall Restrictions Weight Bearing Restrictions: No    Mobility  Bed Mobility Overal bed mobility: Needs Assistance Bed Mobility: Supine to Sit     Supine to sit: Mod assist;HOB elevated     General bed mobility comments: mod assist for trunk support and scooting hips to EOB  Transfers Overall transfer level: Needs assistance Equipment used: Rolling walker (2 wheeled) Transfers: Sit to/from Stand Sit to Stand: Min assist;Min guard Stand pivot transfers: Min guard       General transfer comment: MinA to power into standing from EOB to RW; able to stand from [facility] with min guard, repeated cues for safe hand placement  Ambulation/Gait Ambulation/Gait assistance: Min guard Gait Distance (Feet): 20 Feet Assistive device: Rolling walker (2 wheeled) Gait Pattern/deviations: Step-through pattern;Decreased stride  length;Trunk flexed Gait velocity: Decreased Gait velocity interpretation: <1.31 ft/sec, indicative of household ambulator General Gait Details: Steps from bed to use BSC, then additional gait to recliner with RW and min guard; pt c/o feeling weak with SOB, no physical assist required, but close min guard for safety/balance   Stairs             Wheelchair Mobility    Modified Rankin (Stroke Patients Only)       Balance Overall balance assessment: Needs assistance Sitting-balance support: No upper extremity supported;Feet supported Sitting balance-Leahy Scale: Fair     Standing balance support: Bilateral upper extremity supported;During functional activity Standing balance-Leahy Scale: Poor Standing balance comment: relaint on BUE support and min guard; assist for posterior pericare                            Cognition Arousal/Alertness: Awake/alert Behavior During Therapy: WFL for tasks assessed/performed Overall Cognitive Status: No family/caregiver present to determine baseline cognitive functioning                                 General Comments: Repetitive with apparent short-term memory deficits and poor attention, requiring frequent redirection to stay on task; likely baseline cognition      Exercises      General Comments        Pertinent Vitals/Pain Pain Assessment: Faces Faces Pain Scale: Hurts a little bit Pain Location: Abdomen (constipation) Pain Descriptors / Indicators: Discomfort Pain Intervention(s): Monitored during session    Home Living  Prior Function            PT Goals (current goals can now be found in the care plan section) Acute Rehab PT Goals Patient Stated Goal: to get stronger Progress towards PT goals: Progressing toward goals    Frequency    Min 3X/week      PT Plan Current plan remains appropriate    Co-evaluation PT/OT/SLP Co-Evaluation/Treatment:  Yes Reason for Co-Treatment: To address functional/ADL transfers(pt declining multiple OOB) PT goals addressed during session: Mobility/safety with mobility;Proper use of DME OT goals addressed during session: ADL's and self-care      AM-PAC PT "6 Clicks" Mobility   Outcome Measure  Help needed turning from your back to your side while in a flat bed without using bedrails?: A Little Help needed moving from lying on your back to sitting on the side of a flat bed without using bedrails?: A Lot Help needed moving to and from a bed to a chair (including a wheelchair)?: A Little Help needed standing up from a chair using your arms (e.g., wheelchair or bedside chair)?: A Little Help needed to walk in hospital room?: A Little Help needed climbing 3-5 steps with a railing? : A Lot 6 Click Score: 16    End of Session   Activity Tolerance: Patient tolerated treatment well Patient left: in chair;with call bell/phone within reach;with bed alarm set Nurse Communication: Mobility status PT Visit Diagnosis: Unsteadiness on feet (R26.81);Other abnormalities of gait and mobility (R26.89);Muscle weakness (generalized) (M62.81)     Time: 7482-7078 PT Time Calculation (min) (ACUTE ONLY): 24 min  Charges:  $Therapeutic Activity: 8-22 mins                    [name], PT, DPT Acute Rehabilitation Services  Pager [phone] Office [phone]  [name] 03/21/2019, 12:27 PM

## 2019-03-21 NOTE — Progress Notes (Signed)
Occupational Therapy Treatment Patient Details Name: Stacy Walls MRN: [id number] DOB: 10/26/1932 Today's Date: 03/21/2019    History of present illness Pt is a 72 t/o female with PMH of R breast CA, CKD III, DM2, HTN, depression, anxiety, UTI, presenting to ED for evaluation of malaise, nausea, fatigue and headache. CT negative, COVID negative. Found with hyponatremia.    OT comments  Patient progressing towards OT goals.  Completing bed mobility with mod assist, min assist for transfers to [facility] using RW, and max assist for toileting.  She requires increased time and effort for all tasks, cueing for safety awareness and hand placement using RW during transfers. Limited by generalized weakness, decreased activity tolerance, and impaired balance. She reports being open to SNF rehab now, continue to recommend SNF unless she have 24/7 support at dc.    Follow Up Recommendations  SNF;Supervision/Assistance - 24 hour(if has 24/7 support at home, [facility] and aide)    Equipment Recommendations  3 in 1 bedside commode(bariatric)    Recommendations for Other Services      Precautions / Restrictions Precautions Precautions: Fall Restrictions Weight Bearing Restrictions: No       Mobility Bed Mobility Overal bed mobility: Needs Assistance Bed Mobility: Supine to Sit     Supine to sit: Mod assist;HOB elevated     General bed mobility comments: mod assist for trunk support and scooting hips to EOB  Transfers Overall transfer level: Needs assistance Equipment used: Rolling walker (2 wheeled) Transfers: Sit to/from [facility] Sit to Stand: Min assist Stand pivot transfers: Min guard       General transfer comment: min assist to power up and steady using RW with cueing for hand placement, safety during transitions; pivoting to [facility] with min guard given increased time     Balance Overall balance assessment: Needs assistance Sitting-balance support: No upper  extremity supported;Feet supported Sitting balance-Leahy Scale: Fair     Standing balance support: Bilateral upper extremity supported;During functional activity Standing balance-Leahy Scale: Poor Standing balance comment: relaint on BUE support and min guard                           ADL either performed or assessed with clinical judgement   ADL Overall ADL's : Needs assistance/impaired     Grooming: Set up;Sitting;Wash/dry face               Lower Body Dressing: Maximal assistance;Sit to/from stand Lower Body Dressing Details (indicate cue type and reason): min assist sit to stand, reliant on BUE suppport and needs assist to reach feet Toilet Transfer: Minimal assistance;Stand-pivot;BSC;RW   Toileting- Clothing Manipulation and Hygiene: Maximal assistance;Sit to/from stand Toileting - Clothing Manipulation Details (indicate cue type and reason): assist for clothing mgmt and hygiene after +BM     Functional mobility during ADLs: Minimal assistance;Rolling walker General ADL Comments: pt limited by decreased activity tolerance, weakness and balance     Vision   Vision Assessment?: No apparent visual deficits   Perception     Praxis      Cognition Arousal/Alertness: Awake/alert Behavior During Therapy: WFL for tasks assessed/performed Overall Cognitive Status: Within Functional Limits for tasks assessed                                          Exercises     Shoulder Instructions  General Comments      Pertinent Vitals/ Pain       Pain Assessment: No/denies pain  Home Living                                          Prior Functioning/Environment              Frequency  Min 2X/week        Progress Toward Goals  OT Goals(current goals can now be found in the care plan section)  Progress towards OT goals: Progressing toward goals  Acute Rehab OT Goals Patient Stated Goal: to get stronger OT  Goal Formulation: With patient  Plan Discharge plan remains appropriate;Frequency remains appropriate    Co-evaluation    PT/OT/SLP Co-Evaluation/Treatment: Yes Reason for Co-Treatment: To address functional/ADL transfers   OT goals addressed during session: ADL's and self-care      AM-PAC OT "6 Clicks" Daily Activity     Outcome Measure   Help from another person eating meals?: None Help from another person taking care of personal grooming?: A Little Help from another person toileting, which includes using toliet, bedpan, or urinal?: A Lot Help from another person bathing (including washing, rinsing, drying)?: A Lot Help from another person to put on and taking off regular upper body clothing?: A Little Help from another person to put on and taking off regular lower body clothing?: A Lot 6 Click Score: 16    End of Session Equipment Utilized During Treatment: Rolling walker  OT Visit Diagnosis: Other abnormalities of gait and mobility (R26.89);Muscle weakness (generalized) (M62.81)   Activity Tolerance Patient tolerated treatment well   Patient Left in chair;with call bell/phone within reach;with chair alarm set   Nurse Communication Mobility status        Time: 3295-1884 OT Time Calculation (min): 24 min  Charges: OT General Charges $OT Visit: 1 Visit OT Treatments $Self Care/Home Management : 8-22 mins  Delight Stare, [place] Pager [phone] Office [phone]    Delight Stare 03/21/2019, 10:30 AM

## 2019-03-21 NOTE — Plan of Care (Signed)
  Problem: Education: ?Goal: Knowledge of General Education information will improve ?Description: Including pain rating scale, medication(s)/side effects and non-pharmacologic comfort measures ?Outcome: Progressing ?  ?Problem: Health Behavior/Discharge Planning: ?Goal: Ability to manage health-related needs will improve ?Outcome: Progressing ?  ?Problem: Clinical Measurements: ?Goal: Ability to maintain clinical measurements within normal limits will improve ?Outcome: Progressing ?  ?Problem: Coping: ?Goal: Level of anxiety will decrease ?Outcome: Progressing ?  ?Problem: Pain Managment: ?Goal: General experience of comfort will improve ?Outcome: Progressing ?  ?Problem: Skin Integrity: ?Goal: Risk for impaired skin integrity will decrease ?Outcome: Progressing ?  ?

## 2019-03-21 NOTE — Progress Notes (Signed)
  Speech Language Pathology Treatment: Dysphagia  Patient Details Name: Stacy Walls MRN: [id number] DOB: 13-Mar-1933 Today's Date: 03/21/2019 Time: 4392-6599 SLP Time Calculation (min) (ACUTE ONLY): 11 min  Assessment / Plan / Recommendation Clinical Impression  Pt was encountered awake/alert with breakfast meal tray in front of her.  Pt and RN reported that she had been tolerating her current diet (Dysphagia 3 solids + thin liquid) without difficulty.  Pt reported that she was better able to taste her food this morning and was subsequently able to masticate and swallow items from her breakfast tray.  Pt was observed with trials of scrambled eggs, soft potatoes, sausage, and thin liquid via straw sip.  She was able to independently feed herself given set up.  She exhibited mildly prolonged mastication of sausage; however, mastication was effective and no oral residue was observed.  Pt independently took small bites and sips throughout this tx session and no clinical s/sx of aspiration were observed with any trials.  Recommend continuation of Dysphagia 3 (soft) solids and thin liquids with the following precautions: 1) Small bites/sips 2) Slow rate of intake 3) Sit upright 90 degrees.  Pt was re-educated regarding compensatory strategies and she demonstrated understanding.  No further skilled ST is warranted at this time.  Please re-consult if additional needs arise.     HPI HPI: Pt is a 69 t/o female with PMH of R breast CA, CKD III, DM2, HTN, depression, anxiety, UTI, presenting to ED for evaluation of malaise, nausea, fatigue and headache. CT negative, COVID negative. Found with hyponatremia.       SLP Plan  All goals met       Recommendations  Diet recommendations: Dysphagia 3 (mechanical soft);Thin liquid Liquids provided via: Cup;Straw Medication Administration: Whole meds with puree Supervision: Patient able to self feed Compensations: Slow rate;Small sips/bites Postural  Changes and/or Swallow Maneuvers: Seated upright 90 degrees                Oral Care Recommendations: Oral care BID Follow up Recommendations: None SLP Visit Diagnosis: Dysphagia, oral phase (R13.11) Plan: All goals met                     Colin Mulders M.S., CCC-SLP Acute Rehabilitation Services Office: [phone]  [place] 03/21/2019, 9:16 AM

## 2019-03-22 DIAGNOSIS — E871 Hypo-osmolality and hyponatremia: Principal | ICD-10-CM

## 2019-03-22 LAB — CBC WITH DIFFERENTIAL/PLATELET
Abs Immature Granulocytes: 0.02 10*3/uL (ref 0.00–0.07)
Basophils Absolute: 0 10*3/uL (ref 0.0–0.1)
Basophils Relative: 1 %
Eosinophils Absolute: 0.2 10*3/uL (ref 0.0–0.5)
Eosinophils Relative: 5 %
HCT: 27.2 % — ABNORMAL LOW (ref 36.0–46.0)
Hemoglobin: 9.1 g/dL — ABNORMAL LOW (ref 12.0–15.0)
Immature Granulocytes: 1 %
Lymphocytes Relative: 22 %
Lymphs Abs: 0.9 10*3/uL (ref 0.7–4.0)
MCH: 30.8 pg (ref 26.0–34.0)
MCHC: 33.5 g/dL (ref 30.0–36.0)
MCV: 92.2 fL (ref 80.0–100.0)
Monocytes Absolute: 0.6 10*3/uL (ref 0.1–1.0)
Monocytes Relative: 15 %
Neutro Abs: 2.2 10*3/uL (ref 1.7–7.7)
Neutrophils Relative %: 56 %
Platelets: 115 10*3/uL — ABNORMAL LOW (ref 150–400)
RBC: 2.95 MIL/uL — ABNORMAL LOW (ref 3.87–5.11)
RDW: 12.6 % (ref 11.5–15.5)
WBC: 3.9 10*3/uL — ABNORMAL LOW (ref 4.0–10.5)
nRBC: 0 % (ref 0.0–0.2)

## 2019-03-22 LAB — BASIC METABOLIC PANEL
Anion gap: 10 (ref 5–15)
Anion gap: 8 (ref 5–15)
BUN: 29 mg/dL — ABNORMAL HIGH (ref 8–23)
BUN: 28 mg/dL — ABNORMAL HIGH (ref 8–23)
CO2: 24 mmol/L (ref 22–32)
CO2: 25 mmol/L (ref 22–32)
Calcium: 10 mg/dL (ref 8.9–10.3)
Calcium: 9.9 mg/dL (ref 8.9–10.3)
Chloride: 95 mmol/L — ABNORMAL LOW (ref 98–111)
Chloride: 97 mmol/L — ABNORMAL LOW (ref 98–111)
Creatinine, Ser: 2.29 mg/dL — ABNORMAL HIGH (ref 0.44–1.00)
Creatinine, Ser: 1.95 mg/dL — ABNORMAL HIGH (ref 0.44–1.00)
GFR calc Af Amer: 22 mL/min — ABNORMAL LOW (ref >60)
GFR calc Af Amer: 27 mL/min — ABNORMAL LOW (ref >60)
GFR calc non Af Amer: 19 mL/min — ABNORMAL LOW (ref >60)
GFR calc non Af Amer: 23 mL/min — ABNORMAL LOW (ref >60)
Glucose, Bld: 124 mg/dL — ABNORMAL HIGH (ref 70–99)
Glucose, Bld: 143 mg/dL — ABNORMAL HIGH (ref 70–99)
Potassium: 4.6 mmol/L (ref 3.5–5.1)
Potassium: 4.6 mmol/L (ref 3.5–5.1)
Sodium: 129 mmol/L — ABNORMAL LOW (ref 135–145)
Sodium: 130 mmol/L — ABNORMAL LOW (ref 135–145)

## 2019-03-22 LAB — GLUCOSE, CAPILLARY
Glucose-Capillary: 130 mg/dL — ABNORMAL HIGH (ref 70–99)
Glucose-Capillary: 148 mg/dL — ABNORMAL HIGH (ref 70–99)
Glucose-Capillary: 133 mg/dL — ABNORMAL HIGH (ref 70–99)

## 2019-03-22 MED ORDER — LIOTHYRONINE SODIUM 5 MCG PO TABS
5.0000 ug | ORAL_TABLET | Freq: Every day | ORAL | Status: DC
  Administered 2019-03-23 – 2019-03-27 (×5): 5 ug via ORAL
  Filled 2019-03-22 (×6): qty 1

## 2019-03-22 NOTE — Care Management Important Message (Signed)
Important Message  Patient Details  Name: Stacy Walls MRN: [id number] Date of Birth: 05/22/1932   Medicare Important Message Given:  Yes     Shelda Altes 03/22/2019, 1:21 PM

## 2019-03-22 NOTE — Progress Notes (Signed)
Patient ID: Stacy Walls, female   DOB: [birth year], 85 y.o.   MRN: [id number] S: still complaining of intermittent HA O:BP (!) 150/57 (BP Location: Left Arm)   Pulse 88   Temp 98.6 F (37 C) (Oral)   Resp 20   Ht 5\' 2"  (1.575 m)   Wt 110.3 kg Comment: scale b  SpO2 94%   BMI 44.46 kg/m   Intake/Output Summary (Last 24 hours) at 03/22/2019 1320 Last data filed at 03/22/2019 0542 Gross per 24 hour  Intake 430.34 ml  Output 1850 ml  Net -1419.66 ml   Intake/Output: I/O last 3 completed shifts: In: 1030.3 [P.O.:840; I.V.:190.3] Out: 1850 [Urine:1850]  Intake/Output this shift:  No intake/output data recorded. Weight change: -3.141 kg Gen: NAD CVS: no rub Resp: cta Abd: +BS, soft, NT/ND Ext: RUE Edema  Recent Labs  Lab 03/18/19 0003  03/18/19 2041 03/19/19 0516 03/20/19 0519 03/20/19 1545 03/21/19 0525 03/21/19 1631 03/22/19 0648  NA 124*   < > 124* 126* 122* 124* 124* 125* 129*  K 3.9   < > 4.3 4.2 4.6 4.6 4.6 4.9 4.6  CL 87*   < > 89* 91* 87* 89* 90* 93* 95*  CO2 22   < > 26 25 27 24 26  19* 24  GLUCOSE 143*   < > 103* 99 120* 130* 101* 143* 124*  BUN 18   < > 16 17 24* 22 25* 28* 29*  CREATININE 2.16*   < > 1.93* 1.99* 2.00* 2.00* 2.03* 2.11* 2.29*  ALBUMIN 3.5  --   --   --   --  2.7*  --   --   --   CALCIUM 10.2   < > 9.7 9.6 9.2 9.5 9.5 9.6 10.0  PHOS  --   --   --   --   --  2.4*  --   --   --   AST 29  --   --   --   --   --   --   --   --   ALT 16  --   --   --   --   --   --   --   --    < > = values in this interval not displayed.   Liver Function Tests: Recent Labs  Lab 03/18/19 0003 03/20/19 1545  AST 29  --   ALT 16  --   ALKPHOS 47  --   BILITOT 1.4*  --   PROT 5.8*  --   ALBUMIN 3.5 2.7*   Recent Labs  Lab 03/18/19 0003  LIPASE 21   No results for input(s): AMMONIA in the last 168 hours. CBC: Recent Labs  Lab 03/18/19 0003 03/19/19 0516 03/20/19 0519 03/21/19 0525 03/22/19 0648  WBC 6.4 6.0 6.6 5.3 3.9*  NEUTROABS  5.4 3.9 4.3 3.0 2.2  HGB 10.5* 9.2* 8.6* 8.8* 9.1*  HCT 30.4* 27.2* 25.8* 26.2* 27.2*  MCV 89.1 91.6 91.5 91.6 92.2  PLT 128* 115* 110* 111* 115*   Cardiac Enzymes: No results for input(s): CKTOTAL, CKMB, CKMBINDEX, TROPONINI in the last 168 hours. CBG: Recent Labs  Lab 03/21/19 1115 03/21/19 1639 03/21/19 2107 03/22/19 0616 03/22/19 1216  GLUCAP 114* 145* 145* 130* 148*    Iron Studies:  Recent Labs    03/21/19 0525  IRON 44  TIBC 141*  FERRITIN 1,144*   Studies/Results: Ct Abdomen Pelvis Wo Contrast  Result Date: 03/21/2019 CLINICAL DATA:  Unintentional weight loss,  abdominal pain, nausea, vomiting EXAM: CT ABDOMEN AND PELVIS WITHOUT CONTRAST TECHNIQUE: Multidetector CT imaging of the abdomen and pelvis was performed following the standard protocol without IV contrast. COMPARISON:  08/01/2009 FINDINGS: Lower chest: Trace bilateral pleural effusions, right greater than left, with associated compressive atelectasis. Extensive postsurgical and posttreatment changes to the right breast including a 5.5 x 2.2 cm fluid collection adjacent to surgical clips which may represent postoperative seroma or hematoma versus an infected fluid collection (series 3, image 1). Hepatobiliary: No focal liver abnormality is seen. Status post cholecystectomy. No biliary dilatation. Pancreas: Unremarkable. No pancreatic ductal dilatation or surrounding inflammatory changes. Spleen: Normal in size without focal abnormality. Adrenals/Urinary Tract: Adrenal glands are unremarkable. Kidneys are normal, without renal calculi, focal lesion, or hydronephrosis. Bladder is unremarkable. Stomach/Bowel: Stomach is within normal limits. No pericecal or periappendiceal inflammatory changes. Scattered colonic diverticulosis. No evidence of bowel wall thickening, distention, or inflammatory changes. Vascular/Lymphatic: Aortic atherosclerosis. No enlarged abdominal or pelvic lymph nodes. Reproductive: Uterus and bilateral  adnexa are unremarkable. Other: No abdominopelvic ascites. Tiny fat containing umbilical hernia. Musculoskeletal: Degenerative disc disease of the lumbar spine most pronounced at L4-L5. No acute osseous findings. IMPRESSION: 1. No acute abdominopelvic findings. 2. Extensive postsurgical/posttreatment changes to the right breast including a 5.5 x 2.2 cm fluid collection adjacent to surgical clips which may represent postoperative seroma or hematoma. In the appropriate clinical setting, an infected fluid collection is also a consideration. Follow-up with outpatient diagnostic mammogram and possible ultrasound is recommended. 3. Trace bilateral pleural effusions, right greater than left, with associated compressive atelectasis. 4. Aortic atherosclerosis. Aortic Atherosclerosis (ICD10-I70.0). Electronically Signed   By: Davina Poke M.D.   On: 03/21/2019 11:08   . acetaminophen  650 mg Oral TID  . aspirin EC  81 mg Oral Daily  . atorvastatin  20 mg Oral Daily  . bisacodyl  10 mg Oral QHS  . bisacodyl  10 mg Rectal Once  . diclofenac sodium  2 g Topical QID  . feeding supplement (ENSURE ENLIVE)  237 mL Oral BID BM  . ferrous sulfate  325 mg Oral Daily  . fluticasone  2 spray Each Nare Daily  . furosemide  20 mg Oral Daily  . heparin  5,000 Units Subcutaneous Q8H  . hydrALAZINE  50 mg Oral BID  . insulin aspart  0-9 Units Subcutaneous TID WC  . lactulose  10 g Oral Once  . levothyroxine  88 mcg Oral Q0600  . magic mouthwash  10 mL Oral TID  . pantoprazole  40 mg Oral Daily  . sodium chloride flush  3 mL Intravenous Q12H  . tamoxifen  20 mg Oral Daily    BMET    Component Value Date/Time   NA 129 (L) 03/22/2019 0648   K 4.6 03/22/2019 0648   CL 95 (L) 03/22/2019 0648   CO2 24 03/22/2019 0648   GLUCOSE 124 (H) 03/22/2019 0648   BUN 29 (H) 03/22/2019 0648   CREATININE 2.29 (H) 03/22/2019 0648   CREATININE 1.68 (H) 02/27/2019 0924   CALCIUM 10.0 03/22/2019 0648   CALCIUM 10.5 01/12/2011  1116   GFRNONAA 19 (L) 03/22/2019 0648   GFRNONAA 28 (L) 02/27/2019 0924   GFRAA 22 (L) 03/22/2019 0648   GFRAA 32 (L) 02/27/2019 0924   CBC    Component Value Date/Time   WBC 3.9 (L) 03/22/2019 0648   RBC 2.95 (L) 03/22/2019 0648   HGB 9.1 (L) 03/22/2019 0648   HGB 10.3 (L) 02/27/2019 4098  HCT 27.2 (L) 03/22/2019 0648   PLT 115 (L) 03/22/2019 0648   PLT 121 (L) 02/27/2019 0924   MCV 92.2 03/22/2019 0648   MCH 30.8 03/22/2019 0648   MCHC 33.5 03/22/2019 0648   RDW 12.6 03/22/2019 0648   LYMPHSABS 0.9 03/22/2019 0648   MONOABS 0.6 03/22/2019 0648   EOSABS 0.2 03/22/2019 0648   BASOSABS 0.0 03/22/2019 0648    Assessment/Plan: 1. Hyponatremia- initially was likely combination of nausea, quinolone, HCTZ, and volume depletion. Now with ongoing hyponatremia off of offending agents. She does have some edema of RUE but no lower ext edema or presacral edema. Evaluation is complicated by presence of CKD and her inability to fully dilute her urine. Unfortunately repeat urine Na and osmolality were not performed at the time of this note despite being ordered 8 hours ago so difficult to assess renal handling of sodium. She does have edema and was on lasix as an outpatient. Will dose with po lasix and follow response. Will also need to follow orthostatic vital signs to r/o volume depletion. Her TSH is markedly elevated and awaiting free T3 level.  1. Repeat urine Na down to 18 and urine osmolality 208 and serum osmolality 261 consistent with an appropriate response in patient with CKD and possible volume depletion.  2. Sodium level improved with IV NS along with lasix. 3. Will continue with current regimen for now but may need to stop lasix given vigorous diuresis. 2. Diastolic CHF- no lower ext or presacral edema. Continue with home lasix dose. 3. CKD stage 3- she is within her baseline Cr range. Cont to hold ARB and HCTZ 4. Anemia of CKD- was on procrit monthly. Now with  significant drop. Would guaiac stools and follow h/h. redose ESA and follow. 5. HTN- holding ARB/HCTZ and amlodipine per son's request. 6. DM- per primary svc 7. Hypothyroidism- TSH up to 11.724  Donetta Potts, MD [facility] [phone]

## 2019-03-22 NOTE — Progress Notes (Signed)
PROGRESS NOTE  Stacy Walls EHU:[id number] DOB: 01-06-1933 DOA: 03/17/2019 PCP: Stacy Side., FNP  Brief History   Stacy Dorner Armstrongis a 85 y.o.femalewith medical history significant forcancer of the right breast, chronic kidney disease stage III, hypertension, type 2 diabetes mellitus, depression, anxiety, and recent UTI, now presenting to the ED for evaluation of malaise, nausea, fatigue, and headache. Patient reports that she was recently started on Levaquin for a UTI and subsequently developed the above symptoms. Pt denies fevers, chills, cough, shortness of breath, or chest pain, dysuria. In the ED, VSS, EKG features a sinus rhythm with PVCs and QTc interval of 507 ms, head CT is negative for acute intracranial abnormality, labs showed sodium of 124 and creatinine of 2.16, up from 1.68 earlier this month. High-sensitivity troponin is normal. Urinalysis notable for proteinuria. Urine was sent for culture. COVID-19 screening test pending.  The patient has been admitted to a telemetry bed. Nerphology has been consulted. Her HCTZ has been held. The patient appears euvolemic. There is no evidence for UTI. Prologned QT interval is resolved. Monitor.    Consultants  . Nephrology  Procedures  . None  Antibiotics   Anti-infectives (From admission, onward)   None    .  Subjective  The patient is resting comfortably. No new complaints.   Objective   Vitals:  Vitals:   03/22/19 1056 03/22/19 1219  BP: (!) 152/62 (!) 150/57  Pulse: 86 88  Resp:  20  Temp:  98.6 F (37 C)  SpO2: 98% 94%   Exam:  Constitutional:  . The patient is awake, alert, and oriented x 3. No acute distress. Respiratory:  . No increased work of breathing. . No wheezes, rales, or rhonchi . No tactile fremitus Cardiovascular:  . Regular rate and rhythm . No murmurs, ectopy, or gallups. . No lateral PMI. No thrills. Abdomen:  . Abdomen is soft, non-tender, non-distended . No  hernias, masses, or organomegaly . Normoactive bowel sounds.  Musculoskeletal:  . No cyanosis, clubbing, or edema Skin:  . No rashes, lesions, ulcers . palpation of skin: no induration or nodules Neurologic:  . CN 2-12 intact . Sensation all 4 extremities intact Psychiatric:  . Mental status o Mood, affect appropriate o Orientation to person, place, time  . judgment and insight appear intact  I have personally reviewed the following:   Today's Data  . Vitals, bmp, cbc  Scheduled Meds: . acetaminophen  650 mg Oral TID  . aspirin EC  81 mg Oral Daily  . atorvastatin  20 mg Oral Daily  . bisacodyl  10 mg Oral QHS  . bisacodyl  10 mg Rectal Once  . diclofenac sodium  2 g Topical QID  . feeding supplement (ENSURE ENLIVE)  237 mL Oral BID BM  . ferrous sulfate  325 mg Oral Daily  . fluticasone  2 spray Each Nare Daily  . furosemide  20 mg Oral Daily  . heparin  5,000 Units Subcutaneous Q8H  . hydrALAZINE  50 mg Oral BID  . insulin aspart  0-9 Units Subcutaneous TID WC  . lactulose  10 g Oral Once  . levothyroxine  88 mcg Oral Q0600  . magic mouthwash  10 mL Oral TID  . pantoprazole  40 mg Oral Daily  . sodium chloride flush  3 mL Intravenous Q12H  . tamoxifen  20 mg Oral Daily   Continuous Infusions: . sodium chloride 50 mL/hr at 03/22/19 7858    Principal Problem:   Hyponatremia Active  Problems:   Hypothyroidism   Essential hypertension   CAD, NATIVE VESSEL   Diabetes mellitus with renal manifestation (HCC)   Cancer of central portion of right female breast ([place])   Acute renal failure superimposed on stage 3 chronic kidney disease ([place])   LOS: 4 days   A & P  Acute on chronic hyponatremia: Noted to have sodium of 124, down from 127 on 02/27/19 (baseline around 130s). Resolving. 130 today. Nephrology on board, appreciate recs, continue IV fluids plus Lasix. Continue to hold HCTZ and do not continue upon discharge. Monitor. Frequent BMP checks.  Chronic  diastolic HF: Appears euvolemic. Echo with 60 to 65% EF, grade 1 diastolic dysfunction. Continue Lasix as per nephrology.  CKD III: Currently around baseline creatinine with creatinine of 1.95. I appreciate the assistance of nephrology.  UTI: NO evidence of UTI at this time with negative UA and urine culture.  Prolonged QT interval: Resolved. QT interval was 507 on presentation, repeat EKG with normal QTC. Continue telemetry.  Anemia of chronic kidney disease/malignancy: Hemoglobin slowly trending down, no obvious signs of bleeding. Anemia panel iron 44, TIBC 141, sats 31, ferritin 1144, vitamin B12 1835. FOBT pending. Monitor hemoglobin and hematocrit.  Hypertension: Continue amlodipine, hydralazine (son refusing for pt to have amlodipine, as they relate recent changes to beginning amlodipine).Continue to hold hydrochlorothiazide, losartan.  Type 2 diabetes mellitus: Last A1c 5.2. Glucoses are followed with FSBS and SSI. She is on a carbohydrate controlled diet and hypoglycemic protocol. Hold home glipizide, Januvia.  Hypothyroidism: Continue Synthroid. TSH and FT4, FT3 should be followed as outpatient by PCP in 6 weeks.  Hyperlipidemia: Continue Lipitor  GERD: Continue PPI  Breast cancer, right: S/p lumpectomy, LN dissection, and radiation.Continue tamoxifen, continue oncology follow-up.  Morbid obesity: Lifestyle modification advised  I have seen and examined this patient myself. I have spent 28 minutes in her evaluation and care.  DVT prophylaxis: Heparin Code Status: Full Code Family Communication: None available Disposition Plan: CIR   Nicey Krah, DO [facility] Direct contact: see [email or web address]  7PM-7AM contact night coverage as above 03/22/2019, 5:12 PM  LOS: 4 days

## 2019-03-22 NOTE — Progress Notes (Addendum)
Stacy Walls   DOB:[birth year]   VX#:[id number]   QZR#:[id number]  Oncology follow up  Subjective: Pt's daughter called our service over the weekend and informed us that she was admitted on 11/27 for fatigue, nausea, headache, and mental status change.  Patient was awake, but disoriented when I saw her in the late afternoon.  Per patient's daughter, her symptoms started recent antibiotics Levaquin for UTI.  Per patient's nurse, she has been confused all day.    Objective:  Vitals:   03/22/19 1219 03/22/19 1934  BP: (!) 150/57 (!) 154/62  Pulse: 88 98  Resp: 20 20  Temp: 98.6 F (37 C) 98.8 F (37.1 C)  SpO2: 94% 96%    Body mass index is 44.46 kg/m.  Intake/Output Summary (Last 24 hours) at 03/22/2019 2022 Last data filed at 03/22/2019 1600 Gross per 24 hour  Intake 924.87 ml  Output 1701 ml  Net -776.13 ml     Sclerae unicteric  Oropharynx clear  No peripheral adenopathy  Lungs clear -- no rales or rhonchi  Heart regular rate and rhythm  Abdomen benign   CBG (last 3)  Recent Labs    03/21/19 2107 03/22/19 0616 03/22/19 1216  GLUCAP 145* 130* 148*     Labs:  Urine Studies No results for input(s): UHGB, CRYS in the last 72 hours.  Invalid input(s): UACOL, UAPR, USPG, UPH, UTP, UGL, UKET, UBIL, UNIT, UROB, [place], UEPI, UWBC, Duwayne Heck [place], [place]  Basic Metabolic Panel: Recent Labs  Lab 03/18/19 0551  03/20/19 1545 03/21/19 0525 03/21/19 1631 03/22/19 0648 03/22/19 1412  NA 124*   < > 124* 124* 125* 129* 130*  K 4.0   < > 4.6 4.6 4.9 4.6 4.6  CL 88*   < > 89* 90* 93* 95* 97*  CO2 25   < > 24 26 19* 24 25  GLUCOSE 136*   < > 130* 101* 143* 124* 143*  BUN 18   < > 22 25* 28* 29* 28*  CREATININE 1.94*   < > 2.00* 2.03* 2.11* 2.29* 1.95*  CALCIUM 9.9   < > 9.5 9.5 9.6 10.0 9.9  MG 1.7  --   --   --   --   --   --   PHOS  --   --  2.4*  --   --   --   --    < > = values in this interval not displayed.   GFR Estimated Creatinine Clearance:  25.6 mL/min (A) (by C-G formula based on SCr of 1.95 mg/dL (H)). Liver Function Tests: Recent Labs  Lab 03/18/19 0003 03/20/19 1545  AST 29  --   ALT 16  --   ALKPHOS 47  --   BILITOT 1.4*  --   PROT 5.8*  --   ALBUMIN 3.5 2.7*   Recent Labs  Lab 03/18/19 0003  LIPASE 21   No results for input(s): AMMONIA in the last 168 hours. Coagulation profile No results for input(s): INR, PROTIME in the last 168 hours.  CBC: Recent Labs  Lab 03/18/19 0003 03/19/19 0516 03/20/19 0519 03/21/19 0525 03/22/19 0648  WBC 6.4 6.0 6.6 5.3 3.9*  NEUTROABS 5.4 3.9 4.3 3.0 2.2  HGB 10.5* 9.2* 8.6* 8.8* 9.1*  HCT 30.4* 27.2* 25.8* 26.2* 27.2*  MCV 89.1 91.6 91.5 91.6 92.2  PLT 128* 115* 110* 111* 115*   Cardiac Enzymes: No results for input(s): CKTOTAL, CKMB, CKMBINDEX, TROPONINI in the last 168 hours. BNP: Invalid input(s):  POCBNP CBG: Recent Labs  Lab 03/21/19 1115 03/21/19 1639 03/21/19 2107 03/22/19 0616 03/22/19 1216  GLUCAP 114* 145* 145* 130* 148*   D-Dimer No results for input(s): DDIMER in the last 72 hours. Hgb A1c No results for input(s): HGBA1C in the last 72 hours. Lipid Profile No results for input(s): CHOL, HDL, LDLCALC, TRIG, CHOLHDL, LDLDIRECT in the last 72 hours. Thyroid function studies Recent Labs    03/20/19 1229 03/20/19 1545  TSH 11.724*  --   T3FREE  --  1.6*   Anemia work up Recent Labs    03/21/19 0525  VITAMINB12 1,835*  FOLATE 15.1  FERRITIN 1,144*  TIBC 141*  IRON 44   Microbiology Recent Results (from the past 240 hour(s))  Urine culture     Status: Abnormal   Collection Time: 03/18/19  2:29 AM   Specimen: Urine, Random  Result Value Ref Range Status   Specimen Description URINE, RANDOM  Final   Special Requests NONE  Final   Culture (A)  Final    <10,000 COLONIES/mL INSIGNIFICANT GROWTH Performed at [facility], [place] [address], [place], [place] [address]    Report Status 03/19/2019 FINAL  Final  SARS CORONAVIRUS 2  (TAT 6-24 HRS) Nasopharyngeal Nasopharyngeal Swab     Status: None   Collection Time: 03/18/19  4:47 AM   Specimen: Nasopharyngeal Swab  Result Value Ref Range Status   SARS Coronavirus 2 NEGATIVE NEGATIVE Final    Comment: (NOTE) SARS-CoV-2 target nucleic acids are NOT DETECTED. The SARS-CoV-2 RNA is generally detectable in upper and lower respiratory specimens during the acute phase of infection. Negative results do not preclude SARS-CoV-2 infection, do not rule out co-infections with other pathogens, and should not be used as the sole basis for treatment or other patient management decisions. Negative results must be combined with clinical observations, patient history, and epidemiological information. The expected result is Negative. Fact Sheet for Patients: SugarRoll.be Fact Sheet for Healthcare Providers: https://www.woods-mathews.com/ This test is not yet approved or cleared by the [place] FDA and  has been authorized for detection and/or diagnosis of SARS-CoV-2 by FDA under an Emergency Use Authorization (EUA). This EUA will remain  in effect (meaning this test can be used) for the duration of the COVID-19 declaration under Section 56 4(b)(1) of the Act, 21 U.S.C. section 360bbb-3(b)(1), unless the authorization is terminated or revoked sooner. Performed at [facility], [place] [address], [place], [place] [address]       Studies:  Ct Abdomen Pelvis Wo Contrast  Result Date: 03/21/2019 CLINICAL DATA:  Unintentional weight loss, abdominal pain, nausea, vomiting EXAM: CT ABDOMEN AND PELVIS WITHOUT CONTRAST TECHNIQUE: Multidetector CT imaging of the abdomen and pelvis was performed following the standard protocol without IV contrast. COMPARISON:  08/01/2009 FINDINGS: Lower chest: Trace bilateral pleural effusions, right greater than left, with associated compressive atelectasis. Extensive postsurgical and posttreatment changes  to the right breast including a 5.5 x 2.2 cm fluid collection adjacent to surgical clips which may represent postoperative seroma or hematoma versus an infected fluid collection (series 3, image 1). Hepatobiliary: No focal liver abnormality is seen. Status post cholecystectomy. No biliary dilatation. Pancreas: Unremarkable. No pancreatic ductal dilatation or surrounding inflammatory changes. Spleen: Normal in size without focal abnormality. Adrenals/Urinary Tract: Adrenal glands are unremarkable. Kidneys are normal, without renal calculi, focal lesion, or hydronephrosis. Bladder is unremarkable. Stomach/Bowel: Stomach is within normal limits. No pericecal or periappendiceal inflammatory changes. Scattered colonic diverticulosis. No evidence of bowel wall thickening,  distention, or inflammatory changes. Vascular/Lymphatic: Aortic atherosclerosis. No enlarged abdominal or pelvic lymph nodes. Reproductive: Uterus and bilateral adnexa are unremarkable. Other: No abdominopelvic ascites. Tiny fat containing umbilical hernia. Musculoskeletal: Degenerative disc disease of the lumbar spine most pronounced at L4-L5. No acute osseous findings. IMPRESSION: 1. No acute abdominopelvic findings. 2. Extensive postsurgical/posttreatment changes to the right breast including a 5.5 x 2.2 cm fluid collection adjacent to surgical clips which may represent postoperative seroma or hematoma. In the appropriate clinical setting, an infected fluid collection is also a consideration. Follow-up with outpatient diagnostic mammogram and possible ultrasound is recommended. 3. Trace bilateral pleural effusions, right greater than left, with associated compressive atelectasis. 4. Aortic atherosclerosis. Aortic Atherosclerosis (ICD10-I70.0). Electronically Signed   By: Davina Poke M.D.   On: 03/21/2019 11:08    Assessment: 85 y.o. female with PMH of right breast cancer, CKD, hypertension, diabetes, presented with worsening fatigue, nausea,  headache, and confusion.  1.  Hyponatremia 2.  Metabolic encephalopathy, probably related to hyponatremia, and medications 3.  Chronic diastolic CHF 4. Recent UTI  5. DM 6. History of right breast cancer diagnosed in 03/2018, s/p surgery,radiation and currently on adjuvant Tamoxifen  7. CKD stage III 8. Anemia of chronic disease   Plan:  -I have reviewed her lab and recent CT abdomen, no scan evidence of cancer recurrence, recent brain MRI was also unremarkable  -management per primary team and nephology -I called her daughter and updated her condition and my thoughts, I reassured her that I am not concerned about breast cancer recurrence, I do not think her symptoms are related to tamoxifen. Her daughter is very concerned about symptoms are related to her recent antibiotics and medication change by her new PCP -I will f/u as needed while she is in hospital. F/u in my clinic as it's scheduled.    Truitt Merle, MD 03/22/2019

## 2019-03-22 NOTE — Plan of Care (Signed)

## 2019-03-22 NOTE — Progress Notes (Signed)
Nurse spoke with patient's son regarding plan of care for patient. All questions were answered. Per patient's son request, nurse took blood pressure after medication administration. Current vitals are as follows:    03/22/19 1056  Vitals  BP (!) 152/62  MAP (mmHg) 88  BP Location Left Arm  BP Method Automatic  Patient Position (if appropriate) Lying  Pulse Rate 86  Pulse Rate Source Dinamap  Oxygen Therapy  SpO2 98 %  O2 Device Room Air  MEWS Score  MEWS RR 0  MEWS Pulse 0  MEWS Systolic 0  MEWS LOC 0  MEWS Temp 0  MEWS Score 0  MEWS Score Color Green   Patient currently lying in bed, call bell within reach, television on, and bed lowest position.

## 2019-03-23 LAB — URINALYSIS, COMPLETE (UACMP) WITH MICROSCOPIC
Bilirubin Urine: NEGATIVE
Glucose, UA: NEGATIVE mg/dL
Hgb urine dipstick: NEGATIVE
Ketones, ur: NEGATIVE mg/dL
Leukocytes,Ua: NEGATIVE
Nitrite: NEGATIVE
Protein, ur: 100 mg/dL — AB
Specific Gravity, Urine: 1.009 (ref 1.005–1.030)
pH: 8 (ref 5.0–8.0)

## 2019-03-23 LAB — BASIC METABOLIC PANEL
Anion gap: 9 (ref 5–15)
BUN: 26 mg/dL — ABNORMAL HIGH (ref 8–23)
CO2: 24 mmol/L (ref 22–32)
Calcium: 9.8 mg/dL (ref 8.9–10.3)
Chloride: 96 mmol/L — ABNORMAL LOW (ref 98–111)
Creatinine, Ser: 1.64 mg/dL — ABNORMAL HIGH (ref 0.44–1.00)
GFR calc Af Amer: 33 mL/min — ABNORMAL LOW (ref >60)
GFR calc non Af Amer: 29 mL/min — ABNORMAL LOW (ref >60)
Glucose, Bld: 129 mg/dL — ABNORMAL HIGH (ref 70–99)
Potassium: 4.5 mmol/L (ref 3.5–5.1)
Sodium: 129 mmol/L — ABNORMAL LOW (ref 135–145)

## 2019-03-23 LAB — CBC WITH DIFFERENTIAL/PLATELET
Abs Immature Granulocytes: 0.02 10*3/uL (ref 0.00–0.07)
Basophils Absolute: 0 10*3/uL (ref 0.0–0.1)
Basophils Relative: 1 %
Eosinophils Absolute: 0.3 10*3/uL (ref 0.0–0.5)
Eosinophils Relative: 5 %
HCT: 26.5 % — ABNORMAL LOW (ref 36.0–46.0)
Hemoglobin: 8.8 g/dL — ABNORMAL LOW (ref 12.0–15.0)
Immature Granulocytes: 0 %
Lymphocytes Relative: 17 %
Lymphs Abs: 0.9 10*3/uL (ref 0.7–4.0)
MCH: 31 pg (ref 26.0–34.0)
MCHC: 33.2 g/dL (ref 30.0–36.0)
MCV: 93.3 fL (ref 80.0–100.0)
Monocytes Absolute: 0.6 10*3/uL (ref 0.1–1.0)
Monocytes Relative: 13 %
Neutro Abs: 3.1 10*3/uL (ref 1.7–7.7)
Neutrophils Relative %: 64 %
Platelets: 125 10*3/uL — ABNORMAL LOW (ref 150–400)
RBC: 2.84 MIL/uL — ABNORMAL LOW (ref 3.87–5.11)
RDW: 12.8 % (ref 11.5–15.5)
WBC: 5 10*3/uL (ref 4.0–10.5)
nRBC: 0 % (ref 0.0–0.2)

## 2019-03-23 LAB — GLUCOSE, CAPILLARY
Glucose-Capillary: 154 mg/dL — ABNORMAL HIGH (ref 70–99)
Glucose-Capillary: 108 mg/dL — ABNORMAL HIGH (ref 70–99)
Glucose-Capillary: 139 mg/dL — ABNORMAL HIGH (ref 70–99)
Glucose-Capillary: 93 mg/dL (ref 70–99)
Glucose-Capillary: 115 mg/dL — ABNORMAL HIGH (ref 70–99)

## 2019-03-23 NOTE — Progress Notes (Signed)
Nutrition Follow-up  DOCUMENTATION CODES:   Morbid obesity  INTERVENTION:   -Continue Magic cup TID with meals, each supplement provides 290 kcal and 9 grams of protein -Continue MVI with minerals daily -Continue Ensure Enlive po BID, each supplement provides 350 kcal and 20 grams of protein  NUTRITION DIAGNOSIS:   Inadequate oral intake related to poor appetite, nausea as evidenced by per patient/family report.  Progressing   GOAL:   Patient will meet greater than or equal to 90% of their needs  Progressing   MONITOR:   PO intake, Supplement acceptance, Diet advancement, Labs, Weight trends, Skin, I & O's  REASON FOR ASSESSMENT:   Consult Assessment of nutrition requirement/status, Poor PO  ASSESSMENT:   85 y.o. female with medical history significant for cancer of the right breast, chronic kidney disease stage III, hypertension, type 2 diabetes mellitus, obese class III,  GERD, CAD, hyperparathyroidism secondary to CKD, depression, anxiety, and recent UTI. Admitted for hyponatremia.  Reviewed I/O's: -328 ml x 24 hours and -1 L since admission  UOP: 1.6 L x 24 hours  Called into pt room and spoke with RN, who reports that pt is very confused this morning. She reports pt with fair appetite and consumed some breakfast this morning. Noted meal completion 50-75%. She is consuming her Ensure supplements per MAR.  Per SLP notes, pt is tolerating dysphagia 3 diet with thin liquids without difficulty.  Per chart review, pt family hopeful for CIR at discharge. If pt unable to be admitted to CIR, pt family will take pt home with 24 hour care.   Labs reviewed: Na: 130, CBGS: 133-148 (inpatient orders for glycemic control are 0-9 units insulin aspart TID with meals).   Diet Order:   Diet Order            DIET DYS 3 Room service appropriate? Yes with Assist; Fluid consistency: Thin; Fluid restriction: 1500 mL Fluid  Diet effective now              EDUCATION NEEDS:    Education needs have been addressed  Skin:  Skin Assessment: Reviewed RN Assessment  Last BM:  03/22/19  Height:   Ht Readings from Last 1 Encounters:  03/18/19 5\' 2"  (1.575 m)    Weight:   Wt Readings from Last 1 Encounters:  03/23/19 110.7 kg    Ideal Body Weight:  50 kg  BMI:  Body mass index is 44.63 kg/m.  Estimated Nutritional Needs:   Kcal:  1700-1900  Protein:  85-95 grams  Fluid:  </= 1.5 L    Brandyn Thien A. Jimmye Norman, RD, LDN, [place] Registered Dietitian II Certified Diabetes Care and Education Specialist Pager: [phone] After hours Pager: [phone]

## 2019-03-23 NOTE — Progress Notes (Signed)
Palliative Care Progress Note  Conference call with patient's daughter and son last PM for support and to further discuss goals of care. In general Stacy Walls's lab values are improving -her sodium is closer to her baseline for renal function has improved and her hb has been stable. She also has eaten a full meal. They express concerns about her generalized weakness and confusion.  Assessment:  1. Thrush noted on exam: Started Magic mouthwash yesterday- could consider fluconazole as well but she has historically been sensitive to medication changes.  2. Low T3, very high TSH- started low dose cytomel for her asthenia.  3. Hospital Induced delirium- unfortunately this is getting worse- possibly she has some intravascular volume depletion making this worse- but she had large diuresis after lasix and her renal function has improved. Her med list has been cut down significantly and she isnt on any known delirium causing meds. Will repeat a UA. The only way her delirium will get better is for her to get out of the hospital and return home to familiar surroundings where she can get needed rest.  I introduced the concept of Advance care planning which they are open to doing but deferred conversation to discharge planning. Home would be best for her and we need to determine what kind of support is needed to make that happen and what if any of our current interventions could be continued at home.  Will continue to support patient and family.  Lane Hacker, DO Palliative Medicine

## 2019-03-23 NOTE — Progress Notes (Signed)
PROGRESS NOTE  Stacy Walls JSE:[id number] DOB: 12/26/1932 DOA: 03/17/2019 PCP: Sonia Side., FNP  Brief History   Stacy Amara Armstrongis a 85 y.o.femalewith medical history significant forcancer of the right breast, chronic kidney disease stage III, hypertension, type 2 diabetes mellitus, depression, anxiety, and recent UTI, now presenting to the ED for evaluation of malaise, nausea, fatigue, and headache. Patient reports that she was recently started on Levaquin for a UTI and subsequently developed the above symptoms. Pt denies fevers, chills, cough, shortness of breath, or chest pain, dysuria. In the ED, VSS, EKG features a sinus rhythm with PVCs and QTc interval of 507 ms, head CT is negative for acute intracranial abnormality, labs showed sodium of 124 and creatinine of 2.16, up from 1.68 earlier this month. High-sensitivity troponin is normal. Urinalysis notable for proteinuria. Urine was sent for culture. COVID-19 screening test pending.  The patient has been admitted to a telemetry bed. Nerphology has been consulted. Her HCTZ has been held. The patient appears euvolemic. There is no evidence for UTI. Prologned QT interval is resolved. Monitor.   Consultants   Nephrology  Palliative care    Procedures   None  Antibiotics   Anti-infectives (From admission, onward)   None     Subjective  The patient is resting comfortably. She is very confused.   Objective   Vitals:  Vitals:   03/23/19 0520 03/23/19 0826  BP: (!) 152/66 (!) 140/55  Pulse: 78 75  Resp: 20 20  Temp: 98.6 F (37 C) 98.1 F (36.7 C)  SpO2: 95% 98%   Exam:  Constitutional:   The patient is awake and alert. She is very confused.  Respiratory:   No increased work of breathing.  No wheezes, rales, or rhonchi  No tactile fremitus Cardiovascular:   Regular rate and rhythm  No murmurs, ectopy, or gallups.  No lateral PMI. No thrills. Abdomen:   Abdomen is soft,  non-tender, non-distended  No hernias, masses, or organomegaly  Normoactive bowel sounds.  Musculoskeletal:   No cyanosis, clubbing, or edema Skin:   No rashes, lesions, ulcers  palpation of skin: no induration or nodules Neurologic:   Patient is unable to cooperate with exam. Psychiatric:   Mental status: Confused.  I have personally reviewed the following:   Today's Data   Vitals, bmp, cbc  Scheduled Meds:  acetaminophen  650 mg Oral TID   aspirin EC  81 mg Oral Daily   atorvastatin  20 mg Oral Daily   bisacodyl  10 mg Oral QHS   bisacodyl  10 mg Rectal Once   diclofenac sodium  2 g Topical QID   feeding supplement (ENSURE ENLIVE)  237 mL Oral BID BM   ferrous sulfate  325 mg Oral Daily   fluticasone  2 spray Each Nare Daily   furosemide  20 mg Oral Daily   heparin  5,000 Units Subcutaneous Q8H   hydrALAZINE  50 mg Oral BID   insulin aspart  0-9 Units Subcutaneous TID WC   lactulose  10 g Oral Once   levothyroxine  88 mcg Oral Q0600   liothyronine  5 mcg Oral Daily   magic mouthwash  10 mL Oral TID   pantoprazole  40 mg Oral Daily   sodium chloride flush  3 mL Intravenous Q12H   tamoxifen  20 mg Oral Daily   Continuous Infusions:   Principal Problem:   Hyponatremia Active Problems:   Hypothyroidism   Essential hypertension   CAD, NATIVE VESSEL  Diabetes mellitus with renal manifestation (HCC)   Cancer of central portion of right female breast ([place])   Acute renal failure superimposed on stage 3 chronic kidney disease ([place])   LOS: 5 days   A & P  Acute on chronic hyponatremia: Noted to have sodium of 124, down from 127 on 02/27/19 (baseline around 130s). Resolving. Down to 129 again today.  Nephrology on board, appreciate recs, continue IV fluids plus Lasix. Continue to hold HCTZ and do not continue upon discharge. Monitor. Frequent BMP checks.  Chronic diastolic HF: Appears euvolemic. Echo with 60 to 65% EF, grade 1 diastolic  dysfunction. Continue Lasix as per nephrology. Fluid balance is down 442.9L.  CKD III: Currently around baseline creatinine with creatinine of 1.64. I appreciate the assistance of nephrology.  UTI: NO evidence of UTI at this time with negative UA and urine culture.  Prolonged QT interval: Resolved. QT interval was 507 on presentation, repeat EKG with normal QTC. Continue telemetry.  Anemia of chronic kidney disease/malignancy: Hemoglobin stable at 8.8. There are no obvious signs of bleeding. Anemia panel iron 44, TIBC 141, sats 31, ferritin 1144, vitamin B12 1835. FOBT pending. Monitor hemoglobin and hematocrit.  Hypertension: Blood pressures well controlled on hydralazine The patient's son refuses for pt to have amlodipine, as he relates recent changes to beginning amlodipine. Continue to hold hydrochlorothiazide, losartan, and amlodipine.  Type 2 diabetes mellitus: Last A1c 5.2. Glucoses are followed with FSBS and SSI. She is on a carbohydrate controlled diet and hypoglycemic protocol. Hold home glipizide, Januvia. Glucoses have been 108 - 148 in the last 24 hours.  Hypothyroidism: Euthyroid sick by 03/20/2019 results. Continue Synthroid. TSH and FT4, FT3 should be followed as outpatient by PCP in 6 weeks.  Hyperlipidemia: Continue Lipitor  GERD: Continue PPI  Breast cancer, right: S/p lumpectomy, LN dissection, and radiation.Continue tamoxifen, continue oncology follow-up. We appreciate oncology's assistance.   Morbid obesity: Lifestyle modification advised  I have seen and examined this patient myself. I have spent 32 minutes in her evaluation and care.  DVT prophylaxis: Heparin Code Status: Full Code Family Communication: None available Disposition Plan: CIR   Lundy Cozart, DO [facility] Direct contact: see [email or web address]  7PM-7AM contact night coverage as above 03/23/2019, 4:18 PM  LOS: 4 days

## 2019-03-23 NOTE — Progress Notes (Signed)
Physical Therapy Treatment Patient Details Name: Stacy Walls MRN: [id number] DOB: Mar 03, 1933 Today's Date: 03/23/2019    History of Present Illness Pt is a 85 y.o. female with PMH of R breast CA, CKD III, DM2, HTN, depression, anxiety, UTI, presenting to ED 03/17/19 for evaluation of malaise, nausea, fatigue and headache. CT negative, COVID negative. Found with hyponatremia.   PT Comments    Pt with increased confusion this session, conversant but disoriented and inconsistently following commands, at time quiet requiring max cues to re-engage in conversation. Pt requires min guard to maxA for mobility; assist for ADL tasks. Pt would likely benefit from returning to a familiar environment, but if to return home, will need 24/7 assist from family and increased DME (see below). If necessary assist not available, recommend SNF-level therapies to maximize functional mobility and independence prior to return home.    Follow Up Recommendations  SNF;Supervision/Assistance - 24 hour(vs. HH with 24/7 assist)     Equipment Recommendations  Rolling walker with 5" wheels;Other (comment)(bariatric 3in1)    Recommendations for Other Services       Precautions / Restrictions Precautions Precautions: Fall Precaution Comments: Bowel incontinence Restrictions Weight Bearing Restrictions: No    Mobility  Bed Mobility Overal bed mobility: Needs Assistance Bed Mobility: Supine to Sit     Supine to sit: Max assist;HOB elevated     General bed mobility comments: MaxA to initiate movement and assist trunk elevation, then pt scooting to EOB automatically without assist  Transfers Overall transfer level: Needs assistance Equipment used: Rolling walker (2 wheeled) Transfers: Sit to/from Stand Sit to Stand: Min guard         General transfer comment: Stood from bed and BSC multiple times to RW with close min guard, heavy reliance on BUE support  Ambulation/Gait Ambulation/Gait  assistance: Counsellor (Feet): 5 Feet Assistive device: Rolling walker (2 wheeled) Gait Pattern/deviations: Step-to pattern;Trunk flexed Gait velocity: Decreased Gait velocity interpretation: <1.31 ft/sec, indicative of household ambulator General Gait Details: Pt initially taking steps appropriately with RW to [facility]; when time to walk to recliner, pt walking straight into recliner then continuing to march in place, requiring repeated cues to turn around (would turn one step then march in place, etc.); unsure if cognition exacerbated by fatigue?   Stairs             Wheelchair Mobility    Modified Rankin (Stroke Patients Only)       Balance Overall balance assessment: Needs assistance Sitting-balance support: No upper extremity supported;Feet supported Sitting balance-Leahy Scale: Fair     Standing balance support: Bilateral upper extremity supported;During functional activity Standing balance-Leahy Scale: Poor Standing balance comment: relaint on BUE support and min guard; assist for posterior pericare                            Cognition Arousal/Alertness: Awake/alert Behavior During Therapy: WFL for tasks assessed/performed Overall Cognitive Status: No family/caregiver present to determine baseline cognitive functioning Area of Impairment: Orientation;Attention;Memory;Following commands;Safety/judgement;Awareness;Problem solving                 Orientation Level: Disoriented to;Place;Time;Situation Current Attention Level: Sustained Memory: Decreased short-term memory Following Commands: Follows one step commands inconsistently Safety/Judgement: Decreased awareness of safety;Decreased awareness of deficits Awareness: Intellectual Problem Solving: Slow processing;Decreased initiation;Difficulty sequencing;Requires verbal cues General Comments: Pt initially pleasant and conversant, although clearly confused and repetitive of various words.  Disoriented to date even when told tomorrow  is her birthday. While using BSC pt stopped talking and staring out window, max cues to reengage in conversation      Exercises      General Comments        Pertinent Vitals/Pain Pain Assessment: No/denies pain Pain Intervention(s): Monitored during session    Home Living                      Prior Function            PT Goals (current goals can now be found in the care plan section) Progress towards PT goals: Not progressing toward goals - comment(limited by cognition)    Frequency    Min 3X/week      PT Plan Current plan remains appropriate    Co-evaluation              AM-PAC PT "6 Clicks" Mobility   Outcome Measure  Help needed turning from your back to your side while in a flat bed without using bedrails?: A Lot Help needed moving from lying on your back to sitting on the side of a flat bed without using bedrails?: A Lot Help needed moving to and from a bed to a chair (including a wheelchair)?: A Little Help needed standing up from a chair using your arms (e.g., wheelchair or bedside chair)?: A Little Help needed to walk in hospital room?: A Little Help needed climbing 3-5 steps with a railing? : A Lot 6 Click Score: 15    End of Session   Activity Tolerance: Patient limited by fatigue Patient left: in chair;with call bell/phone within reach;with chair alarm set Nurse Communication: Mobility status PT Visit Diagnosis: Unsteadiness on feet (R26.81);Other abnormalities of gait and mobility (R26.89);Muscle weakness (generalized) (M62.81)     Time: 2707-8675 PT Time Calculation (min) (ACUTE ONLY): 26 min  Charges:  $Therapeutic Activity: 23-37 mins                    [name], PT, DPT Acute Rehabilitation Services  Pager [phone] [facility] [place] 03/23/2019, 5:15 PM

## 2019-03-23 NOTE — Progress Notes (Signed)
During morning medication administration, patient became withdrawn and more confused, states she does not know her date of birth or name. Patient is currently lying in bed, talking to people that are not in the room. Current vitals are as follows:   03/23/19 0826  Vitals  Temp 98.1 F (36.7 C)  Temp Source Oral  BP (!) 140/55  MAP (mmHg) 79  BP Location Left Arm  BP Method Automatic  Patient Position (if appropriate) Lying  Pulse Rate 75  Pulse Rate Source Monitor  Resp 20  Oxygen Therapy  SpO2 98 %  MEWS Score  MEWS RR 0  MEWS Pulse 0  MEWS Systolic 0  MEWS LOC 0  MEWS Temp 0  MEWS Score 0  MEWS Score Color Gloris Ham, MD paged.

## 2019-03-23 NOTE — Progress Notes (Addendum)
Patient ID: Stacy Walls, female   DOB: 1933/02/21, 85 y.o.   MRN: [id number] S: More confused today and is nonverbal O:BP (!) 140/55 (BP Location: Left Arm)   Pulse 75   Temp 98.1 F (36.7 C) (Oral)   Resp 20   Ht 5\' 2"  (1.575 m)   Wt 110.7 kg   SpO2 98%   BMI 44.63 kg/m   Intake/Output Summary (Last 24 hours) at 03/23/2019 1313 Last data filed at 03/23/2019 0859 Gross per 24 hour  Intake 977.86 ml  Output 1201 ml  Net -223.14 ml   Intake/Output: I/O last 3 completed shifts: In: 1222.8 [P.O.:480; I.V.:742.8] Out: 2501 [Urine:2500; Stool:1]  Intake/Output this shift:  Total I/O In: 240 [P.O.:240] Out: -  Weight change: 0.408 kg Gen: obese AAF in NAD CVS: no rub Resp: cta Abd: obese, +BS, soft, NT Ext: RUE edema  Recent Labs  Lab 03/18/19 0003  03/20/19 0519 03/20/19 1545 03/21/19 0525 03/21/19 1631 03/22/19 0648 03/22/19 1412 03/23/19 1138  NA 124*   < > 122* 124* 124* 125* 129* 130* 129*  K 3.9   < > 4.6 4.6 4.6 4.9 4.6 4.6 4.5  CL 87*   < > 87* 89* 90* 93* 95* 97* 96*  CO2 22   < > 27 24 26  19* 24 25 24   GLUCOSE 143*   < > 120* 130* 101* 143* 124* 143* 129*  BUN 18   < > 24* 22 25* 28* 29* 28* 26*  CREATININE 2.16*   < > 2.00* 2.00* 2.03* 2.11* 2.29* 1.95* 1.64*  ALBUMIN 3.5  --   --  2.7*  --   --   --   --   --   CALCIUM 10.2   < > 9.2 9.5 9.5 9.6 10.0 9.9 9.8  PHOS  --   --   --  2.4*  --   --   --   --   --   AST 29  --   --   --   --   --   --   --   --   ALT 16  --   --   --   --   --   --   --   --    < > = values in this interval not displayed.   Liver Function Tests: Recent Labs  Lab 03/18/19 0003 03/20/19 1545  AST 29  --   ALT 16  --   ALKPHOS 47  --   BILITOT 1.4*  --   PROT 5.8*  --   ALBUMIN 3.5 2.7*   Recent Labs  Lab 03/18/19 0003  LIPASE 21   No results for input(s): AMMONIA in the last 168 hours. CBC: Recent Labs  Lab 03/19/19 0516 03/20/19 0519 03/21/19 0525 03/22/19 0648 03/23/19 1138  WBC 6.0 6.6 5.3  3.9* 5.0  NEUTROABS 3.9 4.3 3.0 2.2 3.1  HGB 9.2* 8.6* 8.8* 9.1* 8.8*  HCT 27.2* 25.8* 26.2* 27.2* 26.5*  MCV 91.6 91.5 91.6 92.2 93.3  PLT 115* 110* 111* 115* 125*   Cardiac Enzymes: No results for input(s): CKTOTAL, CKMB, CKMBINDEX, TROPONINI in the last 168 hours. CBG: Recent Labs  Lab 03/22/19 0616 03/22/19 1216 03/22/19 2118 03/23/19 0613 03/23/19 1143  GLUCAP 130* 148* 133* 108* 139*    Iron Studies:  Recent Labs    03/21/19 0525  IRON 44  TIBC 141*  FERRITIN 1,144*   Studies/Results: No results found. Marland Kitchen acetaminophen  650  mg Oral TID  . aspirin EC  81 mg Oral Daily  . atorvastatin  20 mg Oral Daily  . bisacodyl  10 mg Oral QHS  . bisacodyl  10 mg Rectal Once  . diclofenac sodium  2 g Topical QID  . feeding supplement (ENSURE ENLIVE)  237 mL Oral BID BM  . ferrous sulfate  325 mg Oral Daily  . fluticasone  2 spray Each Nare Daily  . furosemide  20 mg Oral Daily  . heparin  5,000 Units Subcutaneous Q8H  . hydrALAZINE  50 mg Oral BID  . insulin aspart  0-9 Units Subcutaneous TID WC  . lactulose  10 g Oral Once  . levothyroxine  88 mcg Oral Q0600  . liothyronine  5 mcg Oral Daily  . magic mouthwash  10 mL Oral TID  . pantoprazole  40 mg Oral Daily  . sodium chloride flush  3 mL Intravenous Q12H  . tamoxifen  20 mg Oral Daily    BMET    Component Value Date/Time   NA 129 (L) 03/23/2019 1138   K 4.5 03/23/2019 1138   CL 96 (L) 03/23/2019 1138   CO2 24 03/23/2019 1138   GLUCOSE 129 (H) 03/23/2019 1138   BUN 26 (H) 03/23/2019 1138   CREATININE 1.64 (H) 03/23/2019 1138   CREATININE 1.68 (H) 02/27/2019 0924   CALCIUM 9.8 03/23/2019 1138   CALCIUM 10.5 01/12/2011 1116   GFRNONAA 29 (L) 03/23/2019 1138   GFRNONAA 28 (L) 02/27/2019 0924   GFRAA 33 (L) 03/23/2019 1138   GFRAA 32 (L) 02/27/2019 0924   CBC    Component Value Date/Time   WBC 5.0 03/23/2019 1138   RBC 2.84 (L) 03/23/2019 1138   HGB 8.8 (L) 03/23/2019 1138   HGB 10.3 (L) 02/27/2019 0924    HCT 26.5 (L) 03/23/2019 1138   PLT 125 (L) 03/23/2019 1138   PLT 121 (L) 02/27/2019 0924   MCV 93.3 03/23/2019 1138   MCH 31.0 03/23/2019 1138   MCHC 33.2 03/23/2019 1138   RDW 12.8 03/23/2019 1138   LYMPHSABS 0.9 03/23/2019 1138   MONOABS 0.6 03/23/2019 1138   EOSABS 0.3 03/23/2019 1138   BASOSABS 0.0 03/23/2019 1138    Assessment/Plan: 1. Hyponatremia- initially was likely combination of nausea, quinolone, HCTZ, and volume depletion. Now with ongoing hyponatremia off of offending agents. She does have some edema of RUE but no lower ext edema or presacral edema. Evaluation is complicated by presence of CKD and her inability to fully dilute her urine. Unfortunately repeat urine Na and osmolality were not performed at the time of this note despite being ordered 8 hours ago so difficult to assess renal handling of sodium. She does have edema and was on lasix as an outpatient. Will dose with po lasix and follow response. Will also need to follow orthostatic vital signs to r/o volume depletion. Her TSH is markedly elevated and awaiting free T3 level.  1. Repeat urine Na down to 18 and urine osmolality 208 and serum osmolality 261 consistent with an appropriate response in patient with CKD and possible volume depletion.  2. Sodium level improved with IV NS along with lasix. 3. Will stop IVF's for now and follow sodium levels 4. Continue with lasix 20 mg daily. 2. AMS- pt much more confused this am and was not speaking.  Recommend further evaluation per primary svc.  Glucose WNL at 139 and sodium stable so unclear etiology.  Consider CT scan of head or blood/urine cultures.  3. Diastolic CHF- no lower ext or presacral edema. Continue with home lasix dose. 4. CKD stage 3- she is within her baseline Cr range. Cont to hold ARB and HCTZ 5. Anemia of CKD- was on procrit monthly. Now with significant drop. Would guaiac stools and follow h/h. redose ESA and follow. 6. HTN- holding  ARB/HCTZ and amlodipine per son's request. 7. DM- per primary svc 8. Hypothyroidism- TSH up to 11.724  Donetta Potts, MD [facility] [phone]

## 2019-03-23 NOTE — Progress Notes (Signed)
Inpatient Rehabilitation Admissions Coordinator  I await medical clearance and insurance approval for a possible inpt rehab admit.  Danne Baxter, RN, MSN Rehab Admissions Coordinator [phone] 03/23/2019 12:45 PM

## 2019-03-23 NOTE — Progress Notes (Signed)
Patient's son currently at bedside. Nurse spoke with son regarding plan of care for patient. All questions and concerns were answered. Patient currently lying in bed, patient's son states she is not ready to eat lunch yet.

## 2019-03-23 NOTE — H&P (Signed)
Physical Medicine and Rehabilitation Admission H&P    Chief Complaint  Patient presents with  . Loss of Consciousness  : HPI: Stacy Walls is an 85 year old right-handed female with significant history of right breast cancer diagnosed December 2019 status post radiation and adjuvant tamoxifen followed by Dr. Burr Medico, diastolic congestive heart failure, anemia of chronic disease, chronic kidney disease stage III followed by renal services Dr. Justin Mend, hypertension, type 2 diabetes mellitus, depression, anxiety and recent UTI completing a course of Levaquin.  Per chart review patient lives with grandson.  1 level home one-step to entry.  Modified independent using a straight point cane.  Reportedly independent with ADLs.  Presented 03/18/2019 with malaise, headache, nausea, decreased appetite.  Admission labs hemoglobin 10.5, sodium 124, creatinine 2.16 with latest of 1.683 weeks ago, troponin negative, urinalysis negative nitrite, SARS coronavirus negative, TSH 5.807.  Cranial CT scan/MRI negative for acute changes.  Chest x-ray no edema or consolidation.  CT abdomen pelvis no acute abdominopelvic findings.  Extensive postsurgical posttreatment changes to the right breast that was reviewed by oncology services Dr.Feng showing no evidence of cancer recurrence.  Patient's HCTZ was held due to elevated creatinine and hyponatremia with renal services consulted.  Sodium is slowly improving 129-131 and latest creatinine 1.65.  Patient does remain on low-dose Lasix at the recommendations of renal services.  Subcutaneous heparin for DVT prophylaxis.  Latest hemoglobin 8.6 and monitoring for any bleeding episodes.  C. difficile negative.  Patient is tolerating a mechanical soft diet with fluid restriction.  Palliative care consulted to establish goals of care.  Therapy evaluations completed and patient was admitted for a comprehensive rehab program  Review of Systems  Constitutional: Positive for  malaise/fatigue.       Decreased appetite  HENT: Negative for hearing loss.   Eyes: Negative for blurred vision and double vision.  Respiratory: Positive for cough.   Cardiovascular: Positive for palpitations and leg swelling. Negative for chest pain.  Gastrointestinal: Positive for nausea.       GERD  Genitourinary: Negative for dysuria, flank pain and hematuria.  Musculoskeletal: Positive for joint pain, myalgias and neck pain.  Skin: Negative for rash.  Neurological: Positive for dizziness and headaches. Negative for loss of consciousness.  Psychiatric/Behavioral: Positive for depression.       Anxiety  All other systems reviewed and are negative.  Past Medical History:  Diagnosis Date  . Acute cholecystitis   . Allergic rhinitis   . Anemia   . Arthritis   . Cancer [facility])    Right breast  . Chest pain   . Chronic renal insufficiency    followed by Dr Justin Mend stage 3  . Clostridium difficile colitis   . Constipation   . Coronary atherosclerosis of native coronary vessel   . Cough   . Depression    situational - husband died 06/03/2018  . Diabetes mellitus   . Dizziness   . DM2 (diabetes mellitus, type 2) ([place])   . Dyshidrosis   . Family history of adverse reaction to anesthesia    daughter - nausea  . GERD (gastroesophageal reflux disease)   . Gout   . HLD (hyperlipidemia)   . HTN (hypertension)   . Hypothyroidism   . Neck mass   . Neck pain   . Osteoporosis   . Routine general medical examination at a health care facility   . Secondary hyperparathyroidism (of renal origin)   . Urinary incontinence   . Urinary tract infection  Past Surgical History:  Procedure Laterality Date  . bone density  08/21/05  . BREAST LUMPECTOMY WITH RADIOACTIVE SEED AND AXILLARY LYMPH NODE DISSECTION Right 06/07/2018   Procedure: RIGHT BREAST LUMPECTOMY WITH BRACKETED RADIOACTIVE SEEDS AND RIGHT COMPLETE AXILLARY LYMPH NODE DISSECTION;  Surgeon: Fanny Skates, MD;  Location: [place];   Service: General;  Laterality: Right;  . C-section (other)  1977  . CHOLECYSTECTOMY    . ELECTROCARDIOGRAM  02/01/06  . L ankle surgery Left 1988  . total left knee Left 2007   Family History  Problem Relation Age of Onset  . Coronary artery disease Father   . Stroke Father   . Diabetes Father   . Diabetes Sister   . Stroke Sister   . Diabetes Brother   . Cancer Brother        liver caner  . Breast cancer Neg Hx    Social History:  reports that she has never smoked. She has never used smokeless tobacco. She reports that she does not drink alcohol or use drugs. Allergies:  Allergies  Allergen Reactions  . Ace Inhibitors Cough  . Azithromycin Other (See Comments)    Unknown  . Pioglitazone Other (See Comments)    REACTION: edema  . Sulfonamide Derivatives Rash  . Tramadol Other (See Comments)    dizziness   Medications Prior to Admission  Medication Sig Dispense Refill  . allopurinol (ZYLOPRIM) 300 MG tablet Take 1 tablet (300 mg total) by mouth daily. 90 tablet 3  . ALPRAZolam (XANAX) 0.25 MG tablet Take 0.25 mg by mouth 3 (three) times daily as needed for anxiety.    . AMITIZA 24 MCG capsule Take 24 mcg by mouth 2 (two) times daily.    Marland Kitchen aspirin EC 81 MG tablet Take 81 mg by mouth daily.    . Calcium Carb-Cholecalciferol (CALCIUM 500 +D PO) Take 1 tablet by mouth daily.    . cetirizine (ZYRTEC) 10 MG tablet Take 10 mg by mouth daily.     . diclofenac sodium (VOLTAREN) 1 % GEL APPLY 2 GRAMS EXTERNALLY TO THE AFFECTED AREA FOUR TIMES DAILY (Patient taking differently: Apply 2 g topically 4 (four) times daily as needed (painful joints). ) 100 g 0  . escitalopram (LEXAPRO) 10 MG tablet Take 10 mg by mouth daily. Patient taking it as needed when depressed.    . Ferrous Sulfate (IRON) 325 (65 FE) MG TABS Take 325 mg by mouth daily.     . fluticasone (FLONASE) 50 MCG/ACT nasal spray Place 2 sprays into both nostrils daily as needed.     . furosemide (LASIX) 20 MG tablet Take 1  tablet (20 mg total) by mouth daily. 90 tablet 3  . hydrALAZINE (APRESOLINE) 50 MG tablet Take 100 mg by mouth 2 (two) times daily.     Marland Kitchen levofloxacin (LEVAQUIN) 750 MG tablet Take 750 mg by mouth daily.    Marland Kitchen levothyroxine (SYNTHROID, LEVOTHROID) 88 MCG tablet Take 1 tablet (88 mcg total) by mouth daily. 90 tablet 3  . Multiple Vitamins-Minerals (MULTIVITAMIN WOMEN 50+ PO) Take 1 tablet by mouth daily.     Marland Kitchen omeprazole (PRILOSEC) 20 MG capsule TAKE 1 CAPSULE(20 MG) BY MOUTH DAILY (Patient taking differently: Take 20 mg by mouth daily. ) 90 capsule 0  . ondansetron (ZOFRAN) 4 MG tablet Take 4 mg by mouth every 6 (six) hours as needed.    Vladimir Faster Glycol-Propyl Glycol (SYSTANE) 0.4-0.3 % SOLN Place 1 drop into both eyes daily as needed (for dry  eyes).    . polyethylene glycol (MIRALAX / GLYCOLAX) 17 g packet Take 17 g by mouth daily.    . tamoxifen (NOLVADEX) 20 MG tablet TAKE 1 TABLET(20 MG) BY MOUTH DAILY (Patient taking differently: Take 20 mg by mouth daily. TAKE 1 TABLET(20 MG) BY MOUTH DAIL) 90 tablet 3  . triamcinolone ointment (KENALOG) 0.1 % APPLY TOPICALLY TO THE AFFECTED AREA THREE TIMES DAILY AS NEEDED FOR ITCHING (Patient taking differently: Apply 1 application topically 3 (three) times daily as needed (for itching). ) 454 g 0  . vitamin C (ASCORBIC ACID) 500 MG tablet Take 500 mg by mouth 2 (two) times a week.     Marland Kitchen VITAMIN D PO Take 1 tablet by mouth daily.    Marland Kitchen losartan-hydrochlorothiazide (HYZAAR) 100-25 MG tablet Take 1 tablet by mouth daily. (Patient not taking: Reported on 03/18/2019) 90 tablet 3  . sitaGLIPtin (JANUVIA) 100 MG tablet TAKE 1/2 TABLET(50 MG) BY MOUTH DAILY (Patient not taking: Reported on 03/18/2019) 15 tablet 0    Drug Regimen Review Drug regimen was reviewed and remains appropriate with no significant issues identified  Home: Home Living Family/patient expects to be discharged to:: Private residence Living Arrangements: Other relatives(grandson)  Available Help at Discharge: Family, Available PRN/intermittently Type of Home: House Home Access: Stairs to enter Technical brewer of Steps: 1 Home Layout: One level Bathroom Shower/Tub: Other (comment)(sponge bathing only) Bathroom Toilet: Handicapped height Home Equipment: [place] - single point, Bedside commode   Functional History: Prior Function Level of Independence: Needs assistance Gait / Transfers Assistance Needed: modified independent without assistive  ADL's / Homemaking Assistance Needed: independent with ADLs, limited IADLs   Functional Status:  Mobility: Bed Mobility Overal bed mobility: Needs Assistance Bed Mobility: Supine to Sit Rolling: Supervision Supine to sit: Supervision, HOB elevated Sit to supine: HOB elevated, Mod assist, +2 for safety/equipment General bed mobility comments: Pt. able to utilize bed rails to pull with b ues and bring trunk upright.  guided b les towards eob without physical assistance cues to scoot hips and bring feet to the floor. Transfers Overall transfer level: Needs assistance Equipment used: Rolling walker (2 wheeled) Transfers: Sit to/from Stand, Stand Pivot Transfers Sit to Stand: Min guard Stand pivot transfers: Min assist General transfer comment: no physical assistance for sit/stand, cues for rw management and hand placement Ambulation/Gait Ambulation/Gait assistance: Min guard Gait Distance (Feet): 12 Feet(1st trial 8', 2nd trial 12') Assistive device: Rolling walker (2 wheeled) Gait Pattern/deviations: Step-to pattern, Wide base of support General Gait Details: short step to gait with widened BOS Gait velocity: reduced Gait velocity interpretation: <1.31 ft/sec, indicative of household ambulator    ADL: ADL Overall ADL's : Needs assistance/impaired Grooming: Set up, Sitting, Wash/dry face Upper Body Bathing: Set up, Sitting Lower Body Bathing: +2 for physical assistance, Sit to/from stand, Moderate assistance  Lower Body Bathing Details (indicate cue type and reason): assist to reach feet and buttocks, min assist +2 stand  Upper Body Dressing : Minimal assistance, Sitting Lower Body Dressing: Moderate assistance, Sitting/lateral leans Lower Body Dressing Details (indicate cue type and reason): don and adj. socks while seated eob Toilet Transfer: Stand-pivot, BSC, RW, Min guard, Minimal assistance Toilet Transfer Details (indicate cue type and reason): simulated to recliner  Toileting- Clothing Manipulation and Hygiene: Total assistance, Sit to/from stand, Min guard Toileting - Clothing Manipulation Details (indicate cue type and reason): reports prior to admission she had no issues with pericare says she can not remember how she was doing it but  knows she did not need assistance, able to clean front peri area without assistance in standing but required total a for buttocks in standing Functional mobility during ADLs: Minimal assistance, Rolling walker General ADL Comments: reports not sleeping well but agreeable to oob for toileting and seated in recliner for breakfast.  Cognition: Cognition Overall Cognitive Status: Impaired/Different from baseline Orientation Level: Oriented to time, Oriented to person, Oriented to place Cognition Arousal/Alertness: Awake/alert Behavior During Therapy: WFL for tasks assessed/performed Overall Cognitive Status: Impaired/Different from baseline Area of Impairment: Problem solving Orientation Level: Disoriented to, Place, Time, Situation Current Attention Level: Sustained Memory: Decreased short-term memory Following Commands: Follows one step commands inconsistently Safety/Judgement: Decreased awareness of safety, Decreased awareness of deficits Awareness: Intellectual Problem Solving: Slow processing, Decreased initiation, Requires verbal cues General Comments: pt required increased time to problem solve and benefits from one step commands with multimodal cues  to sequence steps  Physical Exam: Blood pressure (!) 153/70, pulse 69, temperature 98.9 F (37.2 C), temperature source Oral, resp. rate 18, height 5\' 2"  (1.575 m), weight 106.5 kg, SpO2 100 %. Vitals reviewed. Gen: no distress, normal appearing HEENT: oral mucosa pink and moist, NCAT Cardio: Reg rate Chest: normal effort, normal rate of breathing Abd: soft, non-distended Ext: no edema Skin: intact Neuro: Patient is alert sitting up in bed.  She makes good eye contact with examiner.  She was able to provide her name as well as place.  Limited medical historian.  She does follow full simple commands.  Musculoskeletal: Strength diffusely 4/5; no focal deficits.  Psych: pleasant, normal affect  Results for orders placed or performed during the hospital encounter of 03/17/19 (from the past 48 hour(s))  Glucose, capillary     Status: None   Collection Time: 03/25/19  6:30 AM  Result Value Ref Range   Glucose-Capillary 96 70 - 99 mg/dL  Glucose, capillary     Status: Abnormal   Collection Time: 03/25/19 11:35 AM  Result Value Ref Range   Glucose-Capillary 104 (H) 70 - 99 mg/dL   Comment 1 Notify RN    Comment 2 Document in Chart   Glucose, capillary     Status: Abnormal   Collection Time: 03/25/19  4:07 PM  Result Value Ref Range   Glucose-Capillary 114 (H) 70 - 99 mg/dL   Comment 1 Notify RN    Comment 2 Document in Chart   Glucose, capillary     Status: Abnormal   Collection Time: 03/25/19  9:39 PM  Result Value Ref Range   Glucose-Capillary 120 (H) 70 - 99 mg/dL   Comment 1 Notify RN    Comment 2 Document in Chart   CBC with Differential/Platelet     Status: Abnormal   Collection Time: 03/26/19  4:56 AM  Result Value Ref Range   WBC 3.5 (L) 4.0 - 10.5 K/uL   RBC 2.66 (L) 3.87 - 5.11 MIL/uL   Hemoglobin 8.3 (L) 12.0 - 15.0 g/dL   HCT 25.0 (L) 36.0 - 46.0 %   MCV 94.0 80.0 - 100.0 fL   MCH 31.2 26.0 - 34.0 pg   MCHC 33.2 30.0 - 36.0 g/dL   RDW 13.0 11.5 - 15.5 %    Platelets 123 (L) 150 - 400 K/uL   nRBC 0.0 0.0 - 0.2 %   Neutrophils Relative % 47 %   Neutro Abs 1.7 1.7 - 7.7 K/uL   Lymphocytes Relative 25 %   Lymphs Abs 0.9 0.7 - 4.0 K/uL   Monocytes Relative  20 %   Monocytes Absolute 0.7 0.1 - 1.0 K/uL   Eosinophils Relative 7 %   Eosinophils Absolute 0.2 0.0 - 0.5 K/uL   Basophils Relative 1 %   Basophils Absolute 0.0 0.0 - 0.1 K/uL   Immature Granulocytes 0 %   Abs Immature Granulocytes 0.01 0.00 - 0.07 K/uL    Comment: Performed at [facility] [address], [place], [place] [address]  Glucose, capillary     Status: None   Collection Time: 03/26/19  6:18 AM  Result Value Ref Range   Glucose-Capillary 93 70 - 99 mg/dL   Comment 1 Notify RN    Comment 2 Document in Chart   Glucose, capillary     Status: Abnormal   Collection Time: 03/26/19 11:21 AM  Result Value Ref Range   Glucose-Capillary 105 (H) 70 - 99 mg/dL   Comment 1 Notify RN    Comment 2 Document in Chart   Glucose, capillary     Status: Abnormal   Collection Time: 03/26/19  5:21 PM  Result Value Ref Range   Glucose-Capillary 146 (H) 70 - 99 mg/dL   Comment 1 Notify RN    Comment 2 Document in Chart   Glucose, capillary     Status: Abnormal   Collection Time: 03/26/19  9:17 PM  Result Value Ref Range   Glucose-Capillary 114 (H) 70 - 99 mg/dL   Comment 1 Notify RN    Comment 2 Document in Chart   CBC with Differential/Platelet     Status: Abnormal   Collection Time: 03/27/19  5:40 AM  Result Value Ref Range   WBC 3.6 (L) 4.0 - 10.5 K/uL   RBC 2.75 (L) 3.87 - 5.11 MIL/uL   Hemoglobin 8.6 (L) 12.0 - 15.0 g/dL   HCT 25.5 (L) 36.0 - 46.0 %   MCV 92.7 80.0 - 100.0 fL   MCH 31.3 26.0 - 34.0 pg   MCHC 33.7 30.0 - 36.0 g/dL   RDW 13.2 11.5 - 15.5 %   Platelets 126 (L) 150 - 400 K/uL   nRBC 0.0 0.0 - 0.2 %   Neutrophils Relative % 46 %   Neutro Abs 1.7 1.7 - 7.7 K/uL   Lymphocytes Relative 23 %   Lymphs Abs 0.8 0.7 - 4.0 K/uL   Monocytes Relative 22 %    Monocytes Absolute 0.8 0.1 - 1.0 K/uL   Eosinophils Relative 7 %   Eosinophils Absolute 0.3 0.0 - 0.5 K/uL   Basophils Relative 1 %   Basophils Absolute 0.0 0.0 - 0.1 K/uL   Immature Granulocytes 1 %   Abs Immature Granulocytes 0.02 0.00 - 0.07 K/uL    Comment: Performed at [facility], [address], [place], [place] [address]  Basic metabolic panel     Status: Abnormal   Collection Time: 03/27/19  5:40 AM  Result Value Ref Range   Sodium 131 (L) 135 - 145 mmol/L   Potassium 4.0 3.5 - 5.1 mmol/L   Chloride 98 98 - 111 mmol/L   CO2 24 22 - 32 mmol/L   Glucose, Bld 115 (H) 70 - 99 mg/dL   BUN 25 (H) 8 - 23 mg/dL   Creatinine, Ser 1.65 (H) 0.44 - 1.00 mg/dL   Calcium 9.7 8.9 - 10.3 mg/dL   GFR calc non Af Amer 28 (L) >60 mL/min   GFR calc Af Amer 33 (L) >60 mL/min   Anion gap 9 5 - 15    Comment:  Performed at [facility], [place] [address], [place], [place] [address]  Glucose, capillary     Status: Abnormal   Collection Time: 03/27/19  5:57 AM  Result Value Ref Range   Glucose-Capillary 114 (H) 70 - 99 mg/dL   Comment 1 Notify RN    Comment 2 Document in Chart    No results found.     Medical Problem List and Plan: 1.  Debility secondary to acute on chronic hyponatremia/AKI with CKD stage III  -patient may shower  -ELOS/Goals: ModI in PT, OT, I in SLP 2.  Antithrombotics: -DVT/anticoagulation: Subcutaneous heparin  -antiplatelet therapy: Aspirin 81 mg daily 3. Pain Management: Voltaren gel 4 times daily 4. Mood: Provide emotional support  -antipsychotic agents: N/A 5. Neuropsych: This patient is capable of making decisions on her own behalf. 6. Skin/Wound Care: Routine skin checks 7. Fluids/Electrolytes/Nutrition: Routine in and outs with follow-up chemistries. Continue Ensure twice per day. 8.  Diastolic congestive heart failure.  Lasix 20 mg daily.  Monitor for any signs of fluid overload 9.  AKI with CKD stage III/hyponatremia.  Followed by renal  services.  Continue fluid restriction as well as low-dose Lasix 10.  Hypertension.  Hydralazine 50 mg twice daily 11.  Diabetes mellitus.  Hemoglobin A1c 6.3.  SSI. 12.  History of right breast cancer.  Continue tamoxifen.  Follow-up out patient oncology services 13.  Hypothyroidism.  Continue Synthroid/Cytomel 14.  Anemia of chronic disease.  Continue iron supplement 15.  Hyperlipidemia.  Lipitor 16.  GERD.  Protonix 17. Morbid obesity: Dietary follow-up. 18. Disposition: Upon discharge, she should have follow-up with PCP, physiatry, and nephrology.   Lavon Paganini Angiulli, PA-C 03/27/2019   I have personally performed a face to face diagnostic evaluation, including, but not limited to relevant history and physical exam findings, of this patient and developed relevant assessment and plan.  Additionally, I have reviewed and concur with the physician assistant's documentation above.  Leeroy Cha, MD

## 2019-03-24 ENCOUNTER — Inpatient Hospital Stay (HOSPITAL_COMMUNITY): Payer: Medicare Other

## 2019-03-24 LAB — BASIC METABOLIC PANEL
Anion gap: 7 (ref 5–15)
BUN: 24 mg/dL — ABNORMAL HIGH (ref 8–23)
CO2: 26 mmol/L (ref 22–32)
Calcium: 10 mg/dL (ref 8.9–10.3)
Chloride: 98 mmol/L (ref 98–111)
Creatinine, Ser: 1.63 mg/dL — ABNORMAL HIGH (ref 0.44–1.00)
GFR calc Af Amer: 33 mL/min — ABNORMAL LOW (ref >60)
GFR calc non Af Amer: 29 mL/min — ABNORMAL LOW (ref >60)
Glucose, Bld: 92 mg/dL (ref 70–99)
Potassium: 4.4 mmol/L (ref 3.5–5.1)
Sodium: 131 mmol/L — ABNORMAL LOW (ref 135–145)

## 2019-03-24 LAB — C DIFFICILE QUICK SCREEN W PCR REFLEX
C Diff antigen: NEGATIVE
C Diff interpretation: NOT DETECTED
C Diff toxin: NEGATIVE

## 2019-03-24 LAB — CBC WITH DIFFERENTIAL/PLATELET
Abs Immature Granulocytes: 0.01 10*3/uL (ref 0.00–0.07)
Basophils Absolute: 0 10*3/uL (ref 0.0–0.1)
Basophils Relative: 1 %
Eosinophils Absolute: 0.3 10*3/uL (ref 0.0–0.5)
Eosinophils Relative: 8 %
HCT: 25.4 % — ABNORMAL LOW (ref 36.0–46.0)
Hemoglobin: 8.2 g/dL — ABNORMAL LOW (ref 12.0–15.0)
Immature Granulocytes: 0 %
Lymphocytes Relative: 24 %
Lymphs Abs: 0.9 10*3/uL (ref 0.7–4.0)
MCH: 30.4 pg (ref 26.0–34.0)
MCHC: 32.3 g/dL (ref 30.0–36.0)
MCV: 94.1 fL (ref 80.0–100.0)
Monocytes Absolute: 0.8 10*3/uL (ref 0.1–1.0)
Monocytes Relative: 21 %
Neutro Abs: 1.7 10*3/uL (ref 1.7–7.7)
Neutrophils Relative %: 46 %
Platelets: 122 10*3/uL — ABNORMAL LOW (ref 150–400)
RBC: 2.7 MIL/uL — ABNORMAL LOW (ref 3.87–5.11)
RDW: 12.9 % (ref 11.5–15.5)
WBC: 3.6 10*3/uL — ABNORMAL LOW (ref 4.0–10.5)
nRBC: 0 % (ref 0.0–0.2)

## 2019-03-24 LAB — GLUCOSE, CAPILLARY
Glucose-Capillary: 91 mg/dL (ref 70–99)
Glucose-Capillary: 130 mg/dL — ABNORMAL HIGH (ref 70–99)
Glucose-Capillary: 103 mg/dL — ABNORMAL HIGH (ref 70–99)
Glucose-Capillary: 110 mg/dL — ABNORMAL HIGH (ref 70–99)

## 2019-03-24 MED ORDER — GUAIFENESIN 100 MG/5ML PO SOLN
5.0000 mL | ORAL | Status: DC | PRN
  Administered 2019-03-24 – 2019-03-26 (×6): 100 mg via ORAL
  Filled 2019-03-24 (×7): qty 5

## 2019-03-24 MED ORDER — LOPERAMIDE HCL 2 MG PO CAPS: 2.0000 mg | ORAL_CAPSULE | ORAL | Status: DC | PRN

## 2019-03-24 NOTE — TOC Progression Note (Signed)
Transition of Care [facility]) - Progression Note    Patient Details  Name: Stacy Walls MRN: [id number] Date of Birth: [birth year]  Transition of Care [facility] CM/SW Contact  Zenon Mayo, RN Phone Number: 03/24/2019, 2:59 PM  Clinical Narrative:    Per [name] with CIR she has received denial for CIR.  She is working on expedited appeal.    Expected Discharge Plan: (CIR vs. HH) Barriers to Discharge: Ship broker, Continued Medical Work up  Expected Discharge Plan and Services Expected Discharge Plan: (CIR vs. HH) In-house Referral: Clinical Social Work   Post Acute Care Choice: (To be determined) Living arrangements for the past 2 months: Single Family Home                                       Social Determinants of Health (SDOH) Interventions    Readmission Risk Interventions No flowsheet data found.

## 2019-03-24 NOTE — Progress Notes (Signed)
Occupational Therapy Treatment Patient Details Name: Stacy Walls MRN: [id number] DOB: November 21, 1932 Today's Date: 03/24/2019    History of present illness Pt is a 85 y.o. female with PMH of R breast CA, CKD III, DM2, HTN, depression, anxiety, UTI, presenting to ED 03/17/19 for evaluation of malaise, nausea, fatigue and headache. CT negative, COVID negative. Found with hyponatremia.   OT comments  Pt making slow but steady progress towards OT goals this session. Session focus on functional transfer training from EOB>BSC. Pt reports fatigue from being up all night coughing, but still agreeable to session. Overall, pt requires MIN A- Min guard assist for functional mobility and transfers with RW. Pt required increased assist for sit>stand from low BSC. Total A for posterior pericare after BM. DC plan remains appropriate, will continue to follow acutely per POC.    Follow Up Recommendations  SNF;Supervision/Assistance - 24 hour;Other (comment)(if has 24/7 support at home, [facility] and aide)    Equipment Recommendations  3 in 1 bedside commode;Other (comment)(bari)    Recommendations for Other Services      Precautions / Restrictions Precautions Precautions: Fall Precaution Comments: Bowel incontinence Restrictions Weight Bearing Restrictions: No       Mobility Bed Mobility Overal bed mobility: Needs Assistance Bed Mobility: Supine to Sit;Sit to Supine;Rolling Rolling: Min guard;+2 for safety/equipment   Supine to sit: Mod assist;+2 for safety/equipment;HOB elevated Sit to supine: HOB elevated;Mod assist;+2 for safety/equipment   General bed mobility comments: MOD A for all bed mobility +2 helpful, cues to sequence all steps and assist to manage BLEs  Transfers Overall transfer level: Needs assistance Equipment used: Rolling walker (2 wheeled) Transfers: Sit to/from Stand Sit to Stand: Min guard;+2 safety/equipment;Mod assist Stand pivot transfers: Min assist        General transfer comment: min guard sit>stand from elevated EOB; MOD A from BSC; MIN A for stand pivot to BSC from EOB with RW    Balance Overall balance assessment: Needs assistance Sitting-balance support: No upper extremity supported;Feet supported Sitting balance-Leahy Scale: Fair     Standing balance support: Bilateral upper extremity supported;During functional activity Standing balance-Leahy Scale: Poor Standing balance comment: relaint on BUE support and min guard; assist for posterior pericare                           ADL either performed or assessed with clinical judgement   ADL Overall ADL's : Needs assistance/impaired                         Toilet Transfer: Minimal assistance;Stand-pivot;BSC;RW   Toileting- Clothing Manipulation and Hygiene: Total assistance;Sit to/from stand Toileting - Clothing Manipulation Details (indicate cue type and reason): total A for pericare after BM       General ADL Comments: pt limited by decreased activity tolerance, weakness and balance. session focus on stand pivot transfer to [place]?: No apparent visual deficits   Perception     Praxis      Cognition Arousal/Alertness: Awake/alert Behavior During Therapy: WFL for tasks assessed/performed Overall Cognitive Status: No family/caregiver present to determine baseline cognitive functioning Area of Impairment: Following commands;Problem solving                       Following Commands: Follows one step commands inconsistently     Problem Solving: Slow processing;Decreased initiation;Difficulty sequencing;Requires verbal cues;Requires tactile cues General Comments:  pt required increased time to problem solve and benefits from one step commands with multimodal cues to sequence steps        Exercises     Shoulder Instructions       General Comments swelling noted in RUE; positioned RUE on pillow to assist with  edema management    Pertinent Vitals/ Pain       Pain Assessment: Faces Faces Pain Scale: Hurts little more Pain Location: general discomfort from coughing at night Pain Descriptors / Indicators: Discomfort Pain Intervention(s): Monitored during session;Repositioned  Home Living                                          Prior Functioning/Environment              Frequency  Min 2X/week        Progress Toward Goals  OT Goals(current goals can now be found in the care plan section)  Progress towards OT goals: Progressing toward goals  Acute Rehab OT Goals Patient Stated Goal: to get stronger OT Goal Formulation: With patient Time For Goal Achievement: 04/02/19 Potential to Achieve Goals: Good  Plan Discharge plan remains appropriate;Frequency remains appropriate    Co-evaluation                 AM-PAC OT "6 Clicks" Daily Activity     Outcome Measure   Help from another person eating meals?: None Help from another person taking care of personal grooming?: A Little Help from another person toileting, which includes using toliet, bedpan, or urinal?: A Lot Help from another person bathing (including washing, rinsing, drying)?: A Lot Help from another person to put on and taking off regular upper body clothing?: A Little Help from another person to put on and taking off regular lower body clothing?: A Lot 6 Click Score: 16    End of Session Equipment Utilized During Treatment: Rolling walker;Gait belt;Other (comment)(bari BSC)  OT Visit Diagnosis: Other abnormalities of gait and mobility (R26.89);Muscle weakness (generalized) (M62.81)   Activity Tolerance Patient tolerated treatment well   Patient Left in bed;with call bell/phone within reach;with nursing/sitter in room   Nurse Communication Mobility status;Other (comment)(requesting something for cough)        Time: 1005-1030 OT Time Calculation (min): 25 min  Charges: OT General  Charges $OT Visit: 1 Visit OT Treatments $Self Care/Home Management : 23-37 mins  Lanier Clam., COTA/L Acute Rehabilitation Services [phone] [place] 03/24/2019, 11:01 AM

## 2019-03-24 NOTE — Plan of Care (Signed)
  Problem: Activity: Goal: Risk for activity intolerance will decrease Outcome: Progressing   Problem: Safety: Goal: Ability to remain free from injury will improve Outcome: Progressing   

## 2019-03-24 NOTE — Progress Notes (Signed)
Physical Therapy Treatment Patient Details Name: Stacy Walls MRN: [id number] DOB: [birth year] Today's Date: 03/24/2019    History of Present Illness Pt is a 85 y.o. female with PMH of R breast CA, CKD III, DM2, HTN, depression, anxiety, UTI, presenting to ED 03/17/19 for evaluation of malaise, nausea, fatigue and headache. CT negative, COVID negative. Found with hyponatremia.    PT Comments    Pt tolerated treatment well with multiple short seated rest breaks. Pt continues to require significant physical assistance to perform bed mobility, as well as minG-minA for all transfers and ambulation. Pt is able to ambulate very limited household distances at this time due to fatigue and LE weakness. Pt requires PT cues to improve transfer quality due to LE power deficits. Pt will continue to benefit from acute PT services to improve ambulation tolerance and reduce assistance requirements in bed mobility.   Follow Up Recommendations  SNF;Supervision/Assistance - 24 hour     Equipment Recommendations  Rolling walker with 5" wheels;Other (comment)(bariatric 3in1)    Recommendations for Other Services       Precautions / Restrictions Precautions Precautions: Fall Restrictions Weight Bearing Restrictions: No    Mobility  Bed Mobility Overal bed mobility: Needs Assistance Bed Mobility: Supine to Sit     Supine to sit: Mod assist        Transfers Overall transfer level: Needs assistance Equipment used: Rolling walker (2 wheeled) Transfers: Sit to/from Stand Sit to Stand: Min assist            Ambulation/Gait Ambulation/Gait assistance: Min guard Gait Distance (Feet): 12 Feet(1st trial 8', 2nd trial 12') Assistive device: Rolling walker (2 wheeled) Gait Pattern/deviations: Step-to pattern;Wide base of support Gait velocity: reduced Gait velocity interpretation: <1.31 ft/sec, indicative of household ambulator General Gait Details: short step to gait with widened  BOS   Stairs             Wheelchair Mobility    Modified Rankin (Stroke Patients Only)       Balance Overall balance assessment: Needs assistance Sitting-balance support: Single extremity supported;Feet supported Sitting balance-Leahy Scale: Good Sitting balance - Comments: close supervision   Standing balance support: Bilateral upper extremity supported Standing balance-Leahy Scale: Fair Standing balance comment: minG, reliant on BUE support                            Cognition Arousal/Alertness: Awake/alert Behavior During Therapy: WFL for tasks assessed/performed Overall Cognitive Status: Impaired/Different from baseline Area of Impairment: Problem solving                             Problem Solving: Slow processing;Decreased initiation;Requires verbal cues        Exercises      General Comments General comments (skin integrity, edema, etc.): VSS, RA      Pertinent Vitals/Pain Pain Assessment: Faces Faces Pain Scale: Hurts even more Pain Location: R breast Pain Descriptors / Indicators: Discomfort Pain Intervention(s): Limited activity within patient's tolerance    Home Living                      Prior Function            PT Goals (current goals can now be found in the care plan section) Acute Rehab PT Goals Patient Stated Goal: to get stronger Progress towards PT goals: Progressing toward goals    Frequency  Min 3X/week      PT Plan Current plan remains appropriate    Co-evaluation              AM-PAC PT "6 Clicks" Mobility   Outcome Measure  Help needed turning from your back to your side while in a flat bed without using bedrails?: A Lot Help needed moving from lying on your back to sitting on the side of a flat bed without using bedrails?: A Lot Help needed moving to and from a bed to a chair (including a wheelchair)?: A Little Help needed standing up from a chair using your arms (e.g.,  wheelchair or bedside chair)?: A Little Help needed to walk in hospital room?: A Little Help needed climbing 3-5 steps with a railing? : A Lot 6 Click Score: 15    End of Session Equipment Utilized During Treatment: Gait belt Activity Tolerance: Patient tolerated treatment well Patient left: in chair;with call bell/phone within reach;with chair alarm set;with family/visitor present Nurse Communication: Mobility status PT Visit Diagnosis: Unsteadiness on feet (R26.81);Other abnormalities of gait and mobility (R26.89);Muscle weakness (generalized) (M62.81)     Time: 1510-1540 PT Time Calculation (min) (ACUTE ONLY): 30 min  Charges:  $Gait Training: 8-22 mins $Therapeutic Activity: 8-22 mins                     Zenaida Niece, PT, DPT Acute Rehabilitation Pager: [phone]    Zenaida Niece 03/24/2019, 3:57 PM

## 2019-03-24 NOTE — Plan of Care (Signed)
  Problem: Elimination: Goal: Will not experience complications related to urinary retention Outcome: Completed/Met

## 2019-03-24 NOTE — Progress Notes (Signed)
Patient's delirium has improved significantly. Family does want CIR for rehab -goal is to improve her strength and independence following acute illness and deconditioning- at baseline she was independent prior to this admission.

## 2019-03-24 NOTE — Progress Notes (Signed)
Inpatient Rehabilitation Admissions Coordinator  I have received a denial for an inpt rehab admit/CIR by [place] after a peer to peer with Dr. Glendell Docker and MD at [facility] that I arranged yesterday. I spoke with pt's son by phone and he is aware. He is requesting an expedited appeal which I will initiate now. It typically take 2 to 3 business days to get a decision. RN CM and SW are aware. Pt's son is requesting Dr. Benny Lennert contact him for updates on his Mom's condition . I have notified her of this request. I will follow up on Monday.  Danne Baxter, RN, MSN Rehab Admissions Coordinator [phone] 03/24/2019 11:12 AM

## 2019-03-25 LAB — CBC WITH DIFFERENTIAL/PLATELET
Abs Immature Granulocytes: 0.02 10*3/uL (ref 0.00–0.07)
Basophils Absolute: 0 10*3/uL (ref 0.0–0.1)
Basophils Relative: 1 %
Eosinophils Absolute: 0.2 10*3/uL (ref 0.0–0.5)
Eosinophils Relative: 7 %
HCT: 24.3 % — ABNORMAL LOW (ref 36.0–46.0)
Hemoglobin: 8 g/dL — ABNORMAL LOW (ref 12.0–15.0)
Immature Granulocytes: 1 %
Lymphocytes Relative: 29 %
Lymphs Abs: 1 10*3/uL (ref 0.7–4.0)
MCH: 30.5 pg (ref 26.0–34.0)
MCHC: 32.9 g/dL (ref 30.0–36.0)
MCV: 92.7 fL (ref 80.0–100.0)
Monocytes Absolute: 0.7 10*3/uL (ref 0.1–1.0)
Monocytes Relative: 20 %
Neutro Abs: 1.4 10*3/uL — ABNORMAL LOW (ref 1.7–7.7)
Neutrophils Relative %: 42 %
Platelets: 124 10*3/uL — ABNORMAL LOW (ref 150–400)
RBC: 2.62 MIL/uL — ABNORMAL LOW (ref 3.87–5.11)
RDW: 12.9 % (ref 11.5–15.5)
WBC: 3.3 10*3/uL — ABNORMAL LOW (ref 4.0–10.5)
nRBC: 0 % (ref 0.0–0.2)

## 2019-03-25 LAB — BASIC METABOLIC PANEL
Anion gap: 10 (ref 5–15)
BUN: 22 mg/dL (ref 8–23)
CO2: 25 mmol/L (ref 22–32)
Calcium: 9.9 mg/dL (ref 8.9–10.3)
Chloride: 95 mmol/L — ABNORMAL LOW (ref 98–111)
Creatinine, Ser: 1.68 mg/dL — ABNORMAL HIGH (ref 0.44–1.00)
GFR calc Af Amer: 32 mL/min — ABNORMAL LOW (ref >60)
GFR calc non Af Amer: 28 mL/min — ABNORMAL LOW (ref >60)
Glucose, Bld: 93 mg/dL (ref 70–99)
Potassium: 4.2 mmol/L (ref 3.5–5.1)
Sodium: 130 mmol/L — ABNORMAL LOW (ref 135–145)

## 2019-03-25 LAB — GLUCOSE, CAPILLARY
Glucose-Capillary: 96 mg/dL (ref 70–99)
Glucose-Capillary: 104 mg/dL — ABNORMAL HIGH (ref 70–99)
Glucose-Capillary: 114 mg/dL — ABNORMAL HIGH (ref 70–99)
Glucose-Capillary: 120 mg/dL — ABNORMAL HIGH (ref 70–99)

## 2019-03-25 MED ORDER — METOPROLOL TARTRATE 25 MG PO TABS
25.0000 mg | ORAL_TABLET | Freq: Two times a day (BID) | ORAL | Status: DC
  Administered 2019-03-25 – 2019-03-27 (×5): 25 mg via ORAL
  Filled 2019-03-25 (×5): qty 1

## 2019-03-25 NOTE — Progress Notes (Signed)
PROGRESS NOTE  Stacy Walls HAL:[id number] DOB: 15-Nov-1932 DOA: 03/17/2019 PCP: Sonia Side., FNP  Brief History   Stacy Pavelko Armstrongis a 85 y.o.femalewith medical history significant forcancer of the right breast, chronic kidney disease stage III, hypertension, type 2 diabetes mellitus, depression, anxiety, and recent UTI, now presenting to the ED for evaluation of malaise, nausea, fatigue, and headache. Patient reports that she was recently started on Levaquin for a UTI and subsequently developed the above symptoms. Pt denies fevers, chills, cough, shortness of breath, or chest pain, dysuria. In the ED, VSS, EKG features a sinus rhythm with PVCs and QTc interval of 507 ms, head CT is negative for acute intracranial abnormality, labs showed sodium of 124 and creatinine of 2.16, up from 1.68 earlier this month. High-sensitivity troponin is normal. Urinalysis notable for proteinuria. Urine was sent for culture. COVID-19 screening test pending.  The patient has been admitted to a telemetry bed. Nerphology has been consulted. Her HCTZ has been held. The patient appears euvolemic. There is no evidence for UTI. Prologned QT interval is resolved. Monitor.   Consultants   Nephrology  Palliative care  Procedures   None  Antibiotics   Anti-infectives (From admission, onward)   None     Subjective  The patient is resting comfortably. She is less confused than yesterday.  Objective   Vitals:  Vitals:   03/25/19 1402 03/25/19 1459  BP: (!) 159/74 (!) 163/67  Pulse: 75 71  Resp: 18   Temp: 98.3 F (36.8 C)   SpO2: 100%    Exam:  Constitutional:   The patient is awake and alert. She is a little confused. She is in no acute distress.  Respiratory:   No increased work of breathing.  No wheezes, rales, or rhonchi  No tactile fremitus Cardiovascular:   Regular rate and rhythm  No murmurs, ectopy, or gallups.  No lateral PMI. No thrills. Abdomen:     Abdomen is soft, non-tender, non-distended  No hernias, masses, or organomegaly  Normoactive bowel sounds.  Musculoskeletal:   No cyanosis, clubbing, or edema Skin:   No rashes, lesions, ulcers  palpation of skin: no induration or nodules Neurologic:   Patient is unable to cooperate with exam. Psychiatric:   Mental status: Confused.  I have personally reviewed the following:   Today's Data   Vitals, bmp, cbc  Scheduled Meds:  acetaminophen  650 mg Oral TID   aspirin EC  81 mg Oral Daily   atorvastatin  20 mg Oral Daily   bisacodyl  10 mg Rectal Once   diclofenac sodium  2 g Topical QID   feeding supplement (ENSURE ENLIVE)  237 mL Oral BID BM   ferrous sulfate  325 mg Oral Daily   fluticasone  2 spray Each Nare Daily   furosemide  20 mg Oral Daily   heparin  5,000 Units Subcutaneous Q8H   hydrALAZINE  50 mg Oral BID   insulin aspart  0-9 Units Subcutaneous TID WC   lactulose  10 g Oral Once   levothyroxine  88 mcg Oral Q0600   liothyronine  5 mcg Oral Daily   magic mouthwash  10 mL Oral TID   metoprolol tartrate  25 mg Oral BID   pantoprazole  40 mg Oral Daily   sodium chloride flush  3 mL Intravenous Q12H   tamoxifen  20 mg Oral Daily    Principal Problem:   Hyponatremia Active Problems:   Hypothyroidism   Essential hypertension   CAD,  NATIVE VESSEL   Diabetes mellitus with renal manifestation (HCC)   Cancer of central portion of right female breast ([place])   Acute renal failure superimposed on stage 3 chronic kidney disease ([place])   LOS: 7 days   A & P  Acute on chronic hyponatremia: Noted to have sodium of 124, down from 127 on 02/27/19 (baseline around 130s). Resolving. Sodium is 131 again today.  Nephrology on board, appreciate recs, continue IV fluids plus Lasix. Continue to hold HCTZ and do not continue upon discharge. Monitor. Frequent BMP checks.  Chronic diastolic HF: Appears euvolemic. Echo with 60 to 65% EF, grade 1  diastolic dysfunction. Continue Lasix as per nephrology. Fluid balance is down 1.4 L today.  CKD III: Currently around baseline creatinine with creatinine of 1.64. I appreciate the assistance of nephrology.  UTI: No evidence of UTI at this time with negative UA and urine culture.  Prolonged QT interval: Resolved. QT interval was 507 on presentation, repeat EKG with normal QTC. Continue telemetry.  Anemia of chronic kidney disease/malignancy: Hemoglobin stable at 8.2. There are no obvious signs of bleeding. Anemia panel iron 44, TIBC 141, sats 31, ferritin 1144, vitamin B12 1835. FOBT pending. Monitor hemoglobin and hematocrit.  Hypertension: Blood pressures well controlled on hydralazine The patient's son refuses for pt to have amlodipine, as he relates recent changes to beginning amlodipine. Continue to hold hydrochlorothiazide, losartan, and amlodipine.  Type 2 diabetes mellitus: Last A1c 5.2. Glucoses are followed with FSBS and SSI. She is on a carbohydrate controlled diet and hypoglycemic protocol. Hold home glipizide, Januvia. Glucoses have been 91 - 130 in the last 24 hours.  Hypothyroidism: Euthyroid sick by 03/20/2019 results. Continue Synthroid. TSH and FT4, FT3 should be followed as outpatient by PCP in 6 weeks.  Hyperlipidemia: Continue Lipitor  GERD: Continue PPI  Breast cancer, right: S/p lumpectomy, LN dissection, and radiation.Continue tamoxifen, continue oncology follow-up. We appreciate oncology's assistance.   Morbid obesity: Lifestyle modification advised  I have seen and examined this patient myself. I have spent 30 minutes in her evaluation and care.  DVT prophylaxis: Heparin Code Status: Full Code Family Communication: None available Disposition Plan: CIR   Andie Mortimer, DO [facility] Direct contact: see [email or web address]  7PM-7AM contact night coverage as above 03/24/2019, 2:53 PM  LOS: 4 days

## 2019-03-25 NOTE — Progress Notes (Signed)
PROGRESS NOTE  Stacy Walls [id number]:[id number] DOB: Feb 23, 1933 DOA: 03/17/2019 PCP: Sonia Side., FNP  Brief History   Stacy Moret Armstrongis a 85 y.o.femalewith medical history significant forcancer of the right breast, chronic kidney disease stage III, hypertension, type 2 diabetes mellitus, depression, anxiety, and recent UTI, now presenting to the ED for evaluation of malaise, nausea, fatigue, and headache. Patient reports that she was recently started on Levaquin for a UTI and subsequently developed the above symptoms. Pt denies fevers, chills, cough, shortness of breath, or chest pain, dysuria. In the ED, VSS, EKG features a sinus rhythm with PVCs and QTc interval of 507 ms, head CT is negative for acute intracranial abnormality, labs showed sodium of 124 and creatinine of 2.16, up from 1.68 earlier this month. High-sensitivity troponin is normal. Urinalysis notable for proteinuria. Urine was sent for culture. COVID-19 screening test pending.  The patient has been admitted to a telemetry bed. Nerphology has been consulted. Her HCTZ has been held. The patient appears euvolemic. There is no evidence for UTI. Prologned QT interval is resolved. Monitor.   Consultants  . Nephrology . Palliative care  Procedures  . None  Antibiotics   Anti-infectives (From admission, onward)   None     Subjective  The patient is sitting up at bedside today. She states that she doesn't feel well, but cannot tell me exactly why she doesn't feel well.  Objective   Vitals:  Vitals:   03/25/19 1402 03/25/19 1459  BP: (!) 159/74 (!) 163/67  Pulse: 75 71  Resp: 18   Temp: 98.3 F (36.8 C)   SpO2: 100%    Exam:  Constitutional:  . The patient is awake and alert.  She is in no acute distress, but states that she doesn't feel well. Respiratory:  . No increased work of breathing. . No wheezes, rales, or rhonchi . No tactile fremitus Cardiovascular:  . Regular rate and rhythm  . No murmurs, ectopy, or gallups. . No lateral PMI. No thrills. Abdomen:  . Abdomen is soft, non-tender, non-distended . No hernias, masses, or organomegaly . Normoactive bowel sounds.  Musculoskeletal:  . No cyanosis, clubbing, or edema Skin:  . No rashes, lesions, ulcers . palpation of skin: no induration or nodules Neurologic:  . Patient is unable to cooperate with exam. Psychiatric:  . Mental status: Confused.  I have personally reviewed the following:   Today's Data  . Vitals, bmp, cbc  Scheduled Meds: . acetaminophen  650 mg Oral TID  . aspirin EC  81 mg Oral Daily  . atorvastatin  20 mg Oral Daily  . bisacodyl  10 mg Rectal Once  . diclofenac sodium  2 g Topical QID  . feeding supplement (ENSURE ENLIVE)  237 mL Oral BID BM  . ferrous sulfate  325 mg Oral Daily  . fluticasone  2 spray Each Nare Daily  . furosemide  20 mg Oral Daily  . heparin  5,000 Units Subcutaneous Q8H  . hydrALAZINE  50 mg Oral BID  . insulin aspart  0-9 Units Subcutaneous TID WC  . lactulose  10 g Oral Once  . levothyroxine  88 mcg Oral Q0600  . liothyronine  5 mcg Oral Daily  . magic mouthwash  10 mL Oral TID  . metoprolol tartrate  25 mg Oral BID  . pantoprazole  40 mg Oral Daily  . sodium chloride flush  3 mL Intravenous Q12H  . tamoxifen  20 mg Oral Daily    Principal Problem:  Hyponatremia Active Problems:   Hypothyroidism   Essential hypertension   CAD, NATIVE VESSEL   Diabetes mellitus with renal manifestation (HCC)   Cancer of central portion of right female breast ([place])   Acute renal failure superimposed on stage 3 chronic kidney disease ([place])   LOS: 7 days   A & P  Acute on chronic hyponatremia: Noted to have sodium of 124, down from 127 on 02/27/19 (baseline around 130s). Resolving. Sodium is 130 again today.  Nephrology on board, appreciate recs, continue IV fluids plus Lasix. Continue to hold HCTZ and do not continue upon discharge. Monitor. Frequent BMP checks.   Chronic diastolic HF: Appears euvolemic. Echo with 60 to 65% EF, grade 1 diastolic dysfunction. Continue Lasix as per nephrology. Fluid balance is down 1.85 L today.  CKD III: Currently around baseline creatinine with creatinine of 1.68. I appreciate the assistance of nephrology.  UTI: No evidence of UTI at this time with negative UA and urine culture.  Prolonged QT interval: Resolved. QT interval was 507 on presentation, repeat EKG with normal QTC. Continue telemetry.  Anemia of chronic kidney disease/malignancy: Hemoglobin stable at 8.0.Marland Kitchen There are no obvious signs of bleeding. Anemia panel iron 44, TIBC 141, sats 31, ferritin 1144, vitamin B12 1835. FOBT pending. Monitor hemoglobin and hematocrit.  Hypertension: Blood pressures is a little high this afternoon. Metoprolol added to hydralazine. The patient's son refuses for pt to have amlodipine, as he relates recent changes to beginning amlodipine. Continue to hold hydrochlorothiazide, losartan, and amlodipine.  Type 2 diabetes mellitus: Last A1c 5.2. Glucoses are followed with FSBS and SSI. She is on a carbohydrate controlled diet and hypoglycemic protocol. Hold home glipizide, Januvia. Glucoses have been 96 - 114 in the last 24 hours.  Hypothyroidism: Euthyroid sick by 03/20/2019 results. Continue Synthroid. TSH and FT4, FT3 should be followed as outpatient by PCP in 6 weeks.  Hyperlipidemia: Continue Lipitor  GERD: Continue PPI  Breast cancer, right: S/p lumpectomy, LN dissection, and radiation.Continue tamoxifen, continue oncology follow-up. We appreciate oncology's assistance.   Morbid obesity: Lifestyle modification advised  I have seen and examined this patient myself. I have spent 32 minutes in her evaluation and care.  DVT prophylaxis: Heparin Code Status: Full Code Family Communication: None available Disposition Plan: CIR   Carliss Porcaro, DO [facility] Direct contact: see [email or web address]  7PM-7AM  contact night coverage as above 03/25/2019, 5:09 PM  LOS: 4 days

## 2019-03-25 NOTE — Progress Notes (Signed)
Occupational Therapy Treatment Patient Details Name: Stacy Walls MRN: [id number] DOB: July 10, 1932 Today's Date: 03/25/2019    History of present illness Pt is a 85 y.o. female with PMH of R breast CA, CKD III, DM2, HTN, depression, anxiety, UTI, presenting to ED 03/17/19 for evaluation of malaise, nausea, fatigue and headache. CT negative, COVID negative. Found with hyponatremia.   OT comments  Pt. Seen for skilled OT treatment session.  Reports not sleeping well last night but agreeable to participation.  Decreased assistance required for bed mobility today.  Completed transfer to/from bsc min a.    Follow Up Recommendations  SNF;Supervision/Assistance - 24 hour;Other (comment)    Equipment Recommendations  3 in 1 bedside commode;Other (comment)    Recommendations for Other Services      Precautions / Restrictions Precautions Precautions: Fall       Mobility Bed Mobility Overal bed mobility: Needs Assistance Bed Mobility: Supine to Sit Rolling: Supervision   Supine to sit: Supervision;HOB elevated     General bed mobility comments: Pt. able to utilize bed rails to pull with b ues and bring trunk upright.  guided b les towards eob without physical assistance cues to scoot hips and bring feet to the floor.  Transfers Overall transfer level: Needs assistance Equipment used: Rolling walker (2 wheeled) Transfers: Sit to/from [facility] Sit to Stand: Min guard Stand pivot transfers: Min assist       General transfer comment: no physical assistance for sit/stand, cues for rw management and hand placement    Balance                                           ADL either performed or assessed with clinical judgement   ADL Overall ADL's : Needs assistance/impaired                     Lower Body Dressing: Moderate assistance;Sitting/lateral leans Lower Body Dressing Details (indicate cue type and reason): don and adj.  socks while seated eob Toilet Transfer: Stand-pivot;BSC;RW;Min guard;Minimal assistance   Toileting- Clothing Manipulation and Hygiene: Total assistance;Sit to/from stand;Min guard Toileting - Clothing Manipulation Details (indicate cue type and reason): reports prior to admission she had no issues with pericare says she can not remember how she was doing it but knows she did not need assistance, able to clean front peri area without assistance in standing but required total a for buttocks in standing       General ADL Comments: reports not sleeping well but agreeable to oob for toileting and seated in recliner for breakfast.     Vision       Perception     Praxis      Cognition Arousal/Alertness: Awake/alert Behavior During Therapy: Stacy Walls for tasks assessed/performed                                            Exercises     Shoulder Instructions       General Comments  encouragement to complete tasks that she is capable of.  When b.fast tray provided she immediately pressed call button.  When I asked why she said "they feed me" I asked why, she did not have an answer.  I assisted with tray set up and  provided her the fork.  Encouraged her to self feed.  Reviewed that all daily tasks are important for her to maintain strength and independence.  She began to eat without any difficulty.  Provided positive reinforcement and reviewed that she has so many things she can do and she has to keep trying. She smiled and agreed "im not going to give up" and cont. To eat her breakfast without difficulty.  Pt. In good spirits at end of session.     Pertinent Vitals/ Pain       Pain Assessment: No/denies pain  Home Living                                          Prior Functioning/Environment              Frequency  Min 2X/week        Progress Toward Goals  OT Goals(current goals can now be found in the care plan section)  Progress towards OT  goals: Progressing toward goals     Plan Discharge plan remains appropriate;Frequency remains appropriate    Co-evaluation                 AM-PAC OT "6 Clicks" Daily Activity     Outcome Measure   Help from another person eating meals?: None Help from another person taking care of personal grooming?: A Little Help from another person toileting, which includes using toliet, bedpan, or urinal?: A Lot Help from another person bathing (including washing, rinsing, drying)?: A Lot Help from another person to put on and taking off regular upper body clothing?: A Little Help from another person to put on and taking off regular lower body clothing?: A Lot 6 Click Score: 16    End of Session Equipment Utilized During Treatment: Rolling walker;Gait belt  OT Visit Diagnosis: Other abnormalities of gait and mobility (R26.89);Muscle weakness (generalized) (M62.81)   Activity Tolerance Patient tolerated treatment well   Patient Left in chair;with call bell/phone within reach;with chair alarm set   Nurse Communication          Time: [phone] OT Time Calculation (min): 34 min  Charges: OT General Charges $OT Visit: 1 Visit OT Treatments $Self Care/Home Management : 23-37 mins   Janice Coffin, COTA/L 03/25/2019, 8:14 AM

## 2019-03-25 NOTE — Progress Notes (Signed)
Patient ID: Stacy Walls, female   DOB: February 12, 1933, 85 y.o.   MRN: [id number] [facility] Progress Note   Assessment/ Plan:   1. Acute kidney Injury: Likely hemodynamically mediated and renal function appears to have been stable over the last 24 to 48 hours.  Suspect that she has chronic kidney disease associated with her chronic diastolic heart failure.  Do not restart hydrochlorothiazide and reevaluate need to restart ARB based on blood pressure/GFR trend. 2.  Hyponatremia: Suspected to have been volume mediated and appears to be stable at around 130 at this time.  At her age, possible indeed that she has a reset osmostat and would avoid thiazide use. 3.  Metabolic encephalopathy: Unclear etiology and suspected to be in part from her hyponatremia.  Mental status now back to baseline and anticipated discharge to CIR for continued rehabilitation. 4.  Hypertension: Intermittently elevated blood pressures with hydralazine and furosemide.  Uptitrate hydralazine as indicated.  Son declines for her to be on amlodipine and would not restart HCTZ secondary to hyponatremia risk.  Consider ARB down the road.  Will sign off at this time, please call with questions or concerns.  Subjective:   She reports having had a very rough birthday yesterday with diarrhea.  Started feeling better last evening.   Objective:   BP (!) 150/62 (BP Location: Left Arm)   Pulse 77   Temp 98.8 F (37.1 C) (Oral)   Resp 20   Ht 5\' 2"  (1.575 m)   Wt 108.9 kg   SpO2 96%   BMI 43.90 kg/m   Intake/Output Summary (Last 24 hours) at 03/25/2019 0915 Last data filed at 03/25/2019 6967 Gross per 24 hour  Intake 720 ml  Output 550 ml  Net 170 ml   Weight change: -0.737 kg  Physical Exam: Gen: Comfortably sitting up in recliner, eating breakfast CVS: Pulse regular rhythm, normal rate, S1 and S2 normal Resp: Clear to auscultation, no rales/rhonchi Abd: Soft, obese, nontender Ext: Trace ankle edema  with right upper extremity edema.  Imaging: Dg Chest Port 1 View  Result Date: 03/24/2019 CLINICAL DATA:  Onset productive cough last night. EXAM: PORTABLE CHEST 1 VIEW COMPARISON:  Single-view of the chest 03/18/2019. CT chest 04/27/2018. FINDINGS: The lungs are clear. Heart size is normal. Aortic atherosclerosis noted. No pneumothorax or pleural effusion. Surgical clips are seen in the right axilla. IMPRESSION: No acute disease. Atherosclerosis. Electronically Signed   By: Inge Rise M.D.   On: 03/24/2019 13:36    Labs: BMET Recent Labs  Lab 03/20/19 1545 03/21/19 0525 03/21/19 1631 03/22/19 0648 03/22/19 1412 03/23/19 1138 03/24/19 0543 03/25/19 0437  NA 124* 124* 125* 129* 130* 129* 131* 130*  K 4.6 4.6 4.9 4.6 4.6 4.5 4.4 4.2  CL 89* 90* 93* 95* 97* 96* 98 95*  CO2 24 26 19* 24 25 24 26 25   GLUCOSE 130* 101* 143* 124* 143* 129* 92 93  BUN 22 25* 28* 29* 28* 26* 24* 22  CREATININE 2.00* 2.03* 2.11* 2.29* 1.95* 1.64* 1.63* 1.68*  CALCIUM 9.5 9.5 9.6 10.0 9.9 9.8 10.0 9.9  PHOS 2.4*  --   --   --   --   --   --   --    CBC Recent Labs  Lab 03/22/19 0648 03/23/19 1138 03/24/19 0543 03/25/19 0437  WBC 3.9* 5.0 3.6* 3.3*  NEUTROABS 2.2 3.1 1.7 1.4*  HGB 9.1* 8.8* 8.2* 8.0*  HCT 27.2* 26.5* 25.4* 24.3*  MCV 92.2 93.3 94.1  92.7  PLT 115* 125* 122* 124*    Medications:    . acetaminophen  650 mg Oral TID  . aspirin EC  81 mg Oral Daily  . atorvastatin  20 mg Oral Daily  . bisacodyl  10 mg Rectal Once  . diclofenac sodium  2 g Topical QID  . feeding supplement (ENSURE ENLIVE)  237 mL Oral BID BM  . ferrous sulfate  325 mg Oral Daily  . fluticasone  2 spray Each Nare Daily  . furosemide  20 mg Oral Daily  . heparin  5,000 Units Subcutaneous Q8H  . hydrALAZINE  50 mg Oral BID  . insulin aspart  0-9 Units Subcutaneous TID WC  . lactulose  10 g Oral Once  . levothyroxine  88 mcg Oral Q0600  . liothyronine  5 mcg Oral Daily  . magic mouthwash  10 mL Oral TID   . pantoprazole  40 mg Oral Daily  . sodium chloride flush  3 mL Intravenous Q12H  . tamoxifen  20 mg Oral Daily   [name], MD 03/25/2019, 9:15 AM

## 2019-03-26 LAB — GLUCOSE, CAPILLARY
Glucose-Capillary: 93 mg/dL (ref 70–99)
Glucose-Capillary: 105 mg/dL — ABNORMAL HIGH (ref 70–99)
Glucose-Capillary: 146 mg/dL — ABNORMAL HIGH (ref 70–99)
Glucose-Capillary: 114 mg/dL — ABNORMAL HIGH (ref 70–99)

## 2019-03-26 LAB — CBC WITH DIFFERENTIAL/PLATELET
Abs Immature Granulocytes: 0.01 10*3/uL (ref 0.00–0.07)
Basophils Absolute: 0 10*3/uL (ref 0.0–0.1)
Basophils Relative: 1 %
Eosinophils Absolute: 0.2 10*3/uL (ref 0.0–0.5)
Eosinophils Relative: 7 %
HCT: 25 % — ABNORMAL LOW (ref 36.0–46.0)
Hemoglobin: 8.3 g/dL — ABNORMAL LOW (ref 12.0–15.0)
Immature Granulocytes: 0 %
Lymphocytes Relative: 25 %
Lymphs Abs: 0.9 10*3/uL (ref 0.7–4.0)
MCH: 31.2 pg (ref 26.0–34.0)
MCHC: 33.2 g/dL (ref 30.0–36.0)
MCV: 94 fL (ref 80.0–100.0)
Monocytes Absolute: 0.7 10*3/uL (ref 0.1–1.0)
Monocytes Relative: 20 %
Neutro Abs: 1.7 10*3/uL (ref 1.7–7.7)
Neutrophils Relative %: 47 %
Platelets: 123 10*3/uL — ABNORMAL LOW (ref 150–400)
RBC: 2.66 MIL/uL — ABNORMAL LOW (ref 3.87–5.11)
RDW: 13 % (ref 11.5–15.5)
WBC: 3.5 10*3/uL — ABNORMAL LOW (ref 4.0–10.5)
nRBC: 0 % (ref 0.0–0.2)

## 2019-03-26 MED ORDER — MENTHOL 3 MG MT LOZG
1.0000 | LOZENGE | OROMUCOSAL | Status: DC | PRN
  Administered 2019-03-26 – 2019-03-27 (×2): 3 mg via ORAL
  Filled 2019-03-26: qty 9

## 2019-03-26 NOTE — Progress Notes (Signed)
PROGRESS NOTE  Stacy Walls CHY:[id number] DOB: 1932-10-06 DOA: 03/17/2019 PCP: Sonia Side., FNP  Brief History   Stacy Yarrow Armstrongis a 85 y.o.femalewith medical history significant forcancer of the right breast, chronic kidney disease stage III, hypertension, type 2 diabetes mellitus, depression, anxiety, and recent UTI, now presenting to the ED for evaluation of malaise, nausea, fatigue, and headache. Patient reports that she was recently started on Levaquin for a UTI and subsequently developed the above symptoms. Pt denies fevers, chills, cough, shortness of breath, or chest pain, dysuria. In the ED, VSS, EKG features a sinus rhythm with PVCs and QTc interval of 507 ms, head CT is negative for acute intracranial abnormality, labs showed sodium of 124 and creatinine of 2.16, up from 1.68 earlier this month. High-sensitivity troponin is normal. Urinalysis notable for proteinuria. Urine was sent for culture. COVID-19 screening test pending.  The patient has been admitted to a telemetry bed. Nerphology has been consulted. Her HCTZ has been held. The patient appears euvolemic. There is no evidence for UTI. Prologned QT interval is resolved. Monitor.   CIR has been recommended for this patient. Insurance had declined authorization for rehab. Peer to peer was done without a change in this determination. The family wished for an expedited appeal. I have encouraged the patient to await the result of this appeal.  Consultants  . Nephrology . Palliative care  Procedures  . None  Antibiotics   Anti-infectives (From admission, onward)   None     Subjective  The patient is sitting up at bedside today. She has no new complaints.  Objective   Vitals:  Vitals:   03/26/19 0442 03/26/19 0930  BP: (!) 139/92 (!) 159/58  Pulse: 67 73  Resp: 16   Temp: 98.9 F (37.2 C)   SpO2: 97% 98%   Exam:  Constitutional:  The patient is awake and alert. She is in no acute  distress. She wants to go home. Respiratory:  . No increased work of breathing. . No wheezes, rales, or rhonchi . No tactile fremitus Cardiovascular:  . Regular rate and rhythm . No murmurs, ectopy, or gallups. . No lateral PMI. No thrills. Abdomen:  . Abdomen is soft, non-tender, non-distended . No hernias, masses, or organomegaly . Normoactive bowel sounds.  Musculoskeletal:  . No cyanosis, clubbing, or edema Skin:  . No rashes, lesions, ulcers . palpation of skin: no induration or nodules Neurologic:  . Patient is unable to cooperate with exam. Psychiatric:  . Mental status: Awake and alert.  . Mood and affect are congruent  I have personally reviewed the following:   Today's Data  . Vitals, cbc  Scheduled Meds: . acetaminophen  650 mg Oral TID  . aspirin EC  81 mg Oral Daily  . atorvastatin  20 mg Oral Daily  . bisacodyl  10 mg Rectal Once  . diclofenac sodium  2 g Topical QID  . feeding supplement (ENSURE ENLIVE)  237 mL Oral BID BM  . ferrous sulfate  325 mg Oral Daily  . fluticasone  2 spray Each Nare Daily  . furosemide  20 mg Oral Daily  . heparin  5,000 Units Subcutaneous Q8H  . hydrALAZINE  50 mg Oral BID  . insulin aspart  0-9 Units Subcutaneous TID WC  . lactulose  10 g Oral Once  . levothyroxine  88 mcg Oral Q0600  . liothyronine  5 mcg Oral Daily  . magic mouthwash  10 mL Oral TID  . metoprolol tartrate  25 mg Oral BID  . pantoprazole  40 mg Oral Daily  . sodium chloride flush  3 mL Intravenous Q12H  . tamoxifen  20 mg Oral Daily    Principal Problem:   Hyponatremia Active Problems:   Hypothyroidism   Essential hypertension   CAD, NATIVE VESSEL   Diabetes mellitus with renal manifestation (HCC)   Cancer of central portion of right female breast (HCC)   Acute renal failure superimposed on stage 3 chronic kidney disease ([place])   LOS: 8 days   A & P  Acute on chronic hyponatremia: Noted to have sodium of 124, down from 127 on 02/27/19  (baseline around 130s). Resolving. Sodium is 130 again today.  Nephrology on board, appreciate recs, continue IV fluids plus Lasix. Continue to hold HCTZ and do not continue upon discharge. Monitor.  Chronic diastolic HF: Appears euvolemic. Echo with 60 to 65% EF, grade 1 diastolic dysfunction. Continue Lasix as per nephrology. Fluid balance is down 1.85 L today.  CKD III: Currently around baseline creatinine with creatinine of 1.68. I appreciate the assistance of nephrology.  UTI: No evidence of UTI at this time with negative UA and urine culture.  Prolonged QT interval: Resolved. QT interval was 507 on presentation, repeat EKG with normal QTC. Continue telemetry.  Anemia of chronic kidney disease/malignancy: Hemoglobin stable at 8.0.Marland Kitchen There are no obvious signs of bleeding. Anemia panel iron 44, TIBC 141, sats 31, ferritin 1144, vitamin B12 1835. FOBT pending. Monitor hemoglobin and hematocrit.  Hypertension: Blood pressures is a little high this afternoon. Metoprolol added to hydralazine. The patient's son refuses for pt to have amlodipine, as he relates recent changes to beginning amlodipine. Continue to hold hydrochlorothiazide, losartan, and amlodipine.  Type 2 diabetes mellitus: Last A1c 5.2. Glucoses are followed with FSBS and SSI. She is on a carbohydrate controlled diet and hypoglycemic protocol. Hold home glipizide, Januvia. Glucoses have been 96 - 114 in the last 24 hours.  Hypothyroidism: Euthyroid sick by 03/20/2019 results. Continue Synthroid. TSH and FT4, FT3 should be followed as outpatient by PCP in 6 weeks.  Hyperlipidemia: Continue Lipitor  GERD: Continue PPI  Breast cancer, right: S/p lumpectomy, LN dissection, and radiation.Continue tamoxifen, continue oncology follow-up. We appreciate oncology's assistance.   Debility: Recommendation is for the patient to go to CIR for optimal recovery of functionality and mobility. Awaiting outcome of expedited appeal. If  not approved, the patient will go home with home health PT, although this far less than optimal for this patient. She refuses to consider SNF. Inpatient rehab will allow behavior redirection and treatment as well as intensive rehab and monitoring of electrolyte status.  Morbid obesity: Lifestyle modification advised  I have seen and examined this patient myself. I have spent 30 minutes in her evaluation and care.  DVT prophylaxis: Heparin Code Status: Full Code Family Communication: None available Disposition Plan: CIR   Stacy Weismann, DO [facility] Direct contact: see [email or web address]  7PM-7AM contact night coverage as above 03/25/2019, 5:09 PM  LOS: 4 days

## 2019-03-26 NOTE — Progress Notes (Signed)
Cardiac teli order is about to expire per CCMD, paged MD to renew order as needed.

## 2019-03-26 NOTE — Progress Notes (Addendum)
Patient is asking something for cough, requesting cough lozenges. Cough medicine given, not effective, paged MD Swayze and requested order for cough lozenges.

## 2019-03-26 NOTE — Progress Notes (Signed)
Spoke with son, asking to speak with MD, informed personaly Dr. Benny Lennert

## 2019-03-27 ENCOUNTER — Encounter (HOSPITAL_COMMUNITY): Payer: Self-pay | Admitting: *Deleted

## 2019-03-27 ENCOUNTER — Inpatient Hospital Stay (HOSPITAL_COMMUNITY)
Admission: RE | Admit: 2019-03-27 | Discharge: 2019-04-03 | DRG: 945 | Disposition: A | Discharge status: Home Health | Payer: Medicare Other | Source: Intra-hospital | Attending: Physical Medicine and Rehabilitation | Admitting: Physical Medicine and Rehabilitation

## 2019-03-27 ENCOUNTER — Other Ambulatory Visit: Payer: Self-pay

## 2019-03-27 DIAGNOSIS — Z833 Family history of diabetes mellitus: Secondary | ICD-10-CM

## 2019-03-27 DIAGNOSIS — I5032 Chronic diastolic (congestive) heart failure: Secondary | ICD-10-CM | POA: Diagnosis present

## 2019-03-27 DIAGNOSIS — E039 Hypothyroidism, unspecified: Secondary | ICD-10-CM | POA: Diagnosis present

## 2019-03-27 DIAGNOSIS — D638 Anemia in other chronic diseases classified elsewhere: Secondary | ICD-10-CM | POA: Diagnosis present

## 2019-03-27 DIAGNOSIS — Z79899 Other long term (current) drug therapy: Secondary | ICD-10-CM

## 2019-03-27 DIAGNOSIS — Z8249 Family history of ischemic heart disease and other diseases of the circulatory system: Secondary | ICD-10-CM

## 2019-03-27 DIAGNOSIS — I10 Essential (primary) hypertension: Secondary | ICD-10-CM | POA: Diagnosis not present

## 2019-03-27 DIAGNOSIS — G9341 Metabolic encephalopathy: Secondary | ICD-10-CM | POA: Diagnosis present

## 2019-03-27 DIAGNOSIS — Z888 Allergy status to other drugs, medicaments and biological substances status: Secondary | ICD-10-CM

## 2019-03-27 DIAGNOSIS — M109 Gout, unspecified: Secondary | ICD-10-CM | POA: Diagnosis present

## 2019-03-27 DIAGNOSIS — N2581 Secondary hyperparathyroidism of renal origin: Secondary | ICD-10-CM | POA: Diagnosis present

## 2019-03-27 DIAGNOSIS — Z7982 Long term (current) use of aspirin: Secondary | ICD-10-CM

## 2019-03-27 DIAGNOSIS — F329 Major depressive disorder, single episode, unspecified: Secondary | ICD-10-CM | POA: Diagnosis present

## 2019-03-27 DIAGNOSIS — Z6841 Body Mass Index (BMI) 40.0 and over, adult: Secondary | ICD-10-CM

## 2019-03-27 DIAGNOSIS — Z923 Personal history of irradiation: Secondary | ICD-10-CM | POA: Diagnosis not present

## 2019-03-27 DIAGNOSIS — Z885 Allergy status to narcotic agent status: Secondary | ICD-10-CM

## 2019-03-27 DIAGNOSIS — Z853 Personal history of malignant neoplasm of breast: Secondary | ICD-10-CM

## 2019-03-27 DIAGNOSIS — R5381 Other malaise: Principal | ICD-10-CM | POA: Diagnosis present

## 2019-03-27 DIAGNOSIS — N179 Acute kidney failure, unspecified: Secondary | ICD-10-CM | POA: Diagnosis present

## 2019-03-27 DIAGNOSIS — M1990 Unspecified osteoarthritis, unspecified site: Secondary | ICD-10-CM | POA: Diagnosis present

## 2019-03-27 DIAGNOSIS — I2510 Atherosclerotic heart disease of native coronary artery without angina pectoris: Secondary | ICD-10-CM | POA: Diagnosis present

## 2019-03-27 DIAGNOSIS — N183 Chronic kidney disease, stage 3 unspecified: Secondary | ICD-10-CM | POA: Diagnosis present

## 2019-03-27 DIAGNOSIS — E1129 Type 2 diabetes mellitus with other diabetic kidney complication: Secondary | ICD-10-CM | POA: Diagnosis present

## 2019-03-27 DIAGNOSIS — K219 Gastro-esophageal reflux disease without esophagitis: Secondary | ICD-10-CM | POA: Diagnosis present

## 2019-03-27 DIAGNOSIS — K5900 Constipation, unspecified: Secondary | ICD-10-CM | POA: Diagnosis present

## 2019-03-27 DIAGNOSIS — D696 Thrombocytopenia, unspecified: Secondary | ICD-10-CM | POA: Diagnosis present

## 2019-03-27 DIAGNOSIS — E871 Hypo-osmolality and hyponatremia: Secondary | ICD-10-CM | POA: Diagnosis present

## 2019-03-27 DIAGNOSIS — E1122 Type 2 diabetes mellitus with diabetic chronic kidney disease: Secondary | ICD-10-CM | POA: Diagnosis present

## 2019-03-27 DIAGNOSIS — Z882 Allergy status to sulfonamides status: Secondary | ICD-10-CM

## 2019-03-27 DIAGNOSIS — E785 Hyperlipidemia, unspecified: Secondary | ICD-10-CM | POA: Diagnosis present

## 2019-03-27 DIAGNOSIS — Z823 Family history of stroke: Secondary | ICD-10-CM

## 2019-03-27 DIAGNOSIS — Z170 Estrogen receptor positive status [ER+]: Secondary | ICD-10-CM

## 2019-03-27 DIAGNOSIS — I130 Hypertensive heart and chronic kidney disease with heart failure and stage 1 through stage 4 chronic kidney disease, or unspecified chronic kidney disease: Secondary | ICD-10-CM | POA: Diagnosis present

## 2019-03-27 DIAGNOSIS — Z881 Allergy status to other antibiotic agents status: Secondary | ICD-10-CM

## 2019-03-27 DIAGNOSIS — Z79810 Long term (current) use of selective estrogen receptor modulators (SERMs): Secondary | ICD-10-CM

## 2019-03-27 DIAGNOSIS — F419 Anxiety disorder, unspecified: Secondary | ICD-10-CM | POA: Diagnosis present

## 2019-03-27 DIAGNOSIS — Z79890 Hormone replacement therapy (postmenopausal): Secondary | ICD-10-CM

## 2019-03-27 DIAGNOSIS — M810 Age-related osteoporosis without current pathological fracture: Secondary | ICD-10-CM | POA: Diagnosis present

## 2019-03-27 DIAGNOSIS — Z800 Family history of malignant neoplasm of digestive organs: Secondary | ICD-10-CM

## 2019-03-27 LAB — BASIC METABOLIC PANEL
Anion gap: 9 (ref 5–15)
BUN: 25 mg/dL — ABNORMAL HIGH (ref 8–23)
CO2: 24 mmol/L (ref 22–32)
Calcium: 9.7 mg/dL (ref 8.9–10.3)
Chloride: 98 mmol/L (ref 98–111)
Creatinine, Ser: 1.65 mg/dL — ABNORMAL HIGH (ref 0.44–1.00)
GFR calc Af Amer: 33 mL/min — ABNORMAL LOW (ref >60)
GFR calc non Af Amer: 28 mL/min — ABNORMAL LOW (ref >60)
Glucose, Bld: 115 mg/dL — ABNORMAL HIGH (ref 70–99)
Potassium: 4 mmol/L (ref 3.5–5.1)
Sodium: 131 mmol/L — ABNORMAL LOW (ref 135–145)

## 2019-03-27 LAB — GLUCOSE, CAPILLARY
Glucose-Capillary: 114 mg/dL — ABNORMAL HIGH (ref 70–99)
Glucose-Capillary: 129 mg/dL — ABNORMAL HIGH (ref 70–99)
Glucose-Capillary: 118 mg/dL — ABNORMAL HIGH (ref 70–99)
Glucose-Capillary: 119 mg/dL — ABNORMAL HIGH (ref 70–99)

## 2019-03-27 LAB — CBC WITH DIFFERENTIAL/PLATELET
Abs Immature Granulocytes: 0.02 10*3/uL (ref 0.00–0.07)
Basophils Absolute: 0 10*3/uL (ref 0.0–0.1)
Basophils Relative: 1 %
Eosinophils Absolute: 0.3 10*3/uL (ref 0.0–0.5)
Eosinophils Relative: 7 %
HCT: 25.5 % — ABNORMAL LOW (ref 36.0–46.0)
Hemoglobin: 8.6 g/dL — ABNORMAL LOW (ref 12.0–15.0)
Immature Granulocytes: 1 %
Lymphocytes Relative: 23 %
Lymphs Abs: 0.8 10*3/uL (ref 0.7–4.0)
MCH: 31.3 pg (ref 26.0–34.0)
MCHC: 33.7 g/dL (ref 30.0–36.0)
MCV: 92.7 fL (ref 80.0–100.0)
Monocytes Absolute: 0.8 10*3/uL (ref 0.1–1.0)
Monocytes Relative: 22 %
Neutro Abs: 1.7 10*3/uL (ref 1.7–7.7)
Neutrophils Relative %: 46 %
Platelets: 126 10*3/uL — ABNORMAL LOW (ref 150–400)
RBC: 2.75 MIL/uL — ABNORMAL LOW (ref 3.87–5.11)
RDW: 13.2 % (ref 11.5–15.5)
WBC: 3.6 10*3/uL — ABNORMAL LOW (ref 4.0–10.5)
nRBC: 0 % (ref 0.0–0.2)

## 2019-03-27 MED ORDER — FUROSEMIDE 20 MG PO TABS
20.0000 mg | ORAL_TABLET | Freq: Every day | ORAL | Status: DC
  Administered 2019-03-28 – 2019-03-30 (×3): 20 mg via ORAL
  Filled 2019-03-27 (×3): qty 1

## 2019-03-27 MED ORDER — LIOTHYRONINE SODIUM 5 MCG PO TABS
5.0000 ug | ORAL_TABLET | Freq: Every day | ORAL | Status: DC
  Administered 2019-03-28: 5 ug via ORAL
  Filled 2019-03-27: qty 1

## 2019-03-27 MED ORDER — GUAIFENESIN 100 MG/5ML PO SOLN
5.0000 mL | ORAL | Status: DC | PRN
  Administered 2019-03-28 – 2019-04-02 (×4): 100 mg via ORAL
  Filled 2019-03-27 (×5): qty 10

## 2019-03-27 MED ORDER — LOPERAMIDE HCL 2 MG PO CAPS: 2.0000 mg | ORAL_CAPSULE | ORAL | Status: DC | PRN

## 2019-03-27 MED ORDER — FLUTICASONE PROPIONATE 50 MCG/ACT NA SUSP
2.0000 | Freq: Every day | NASAL | Status: DC
  Administered 2019-03-28 – 2019-04-03 (×7): 2 via NASAL
  Filled 2019-03-27: qty 16

## 2019-03-27 MED ORDER — LIOTHYRONINE SODIUM 5 MCG PO TABS: 5.0000 ug | ORAL_TABLET | Freq: Every day | ORAL | 0 refills | Status: DC

## 2019-03-27 MED ORDER — INSULIN ASPART 100 UNIT/ML ~~LOC~~ SOLN
0.0000 [IU] | Freq: Three times a day (TID) | SUBCUTANEOUS | Status: DC
  Administered 2019-03-28 – 2019-04-02 (×6): 1 [IU] via SUBCUTANEOUS

## 2019-03-27 MED ORDER — FERROUS SULFATE 325 (65 FE) MG PO TABS
325.0000 mg | ORAL_TABLET | Freq: Every day | ORAL | Status: DC
  Administered 2019-03-28 – 2019-04-03 (×7): 325 mg via ORAL
  Filled 2019-03-27 (×7): qty 1

## 2019-03-27 MED ORDER — METOPROLOL TARTRATE 25 MG PO TABS: 25.0000 mg | ORAL_TABLET | Freq: Two times a day (BID) | ORAL | 0 refills | Status: DC

## 2019-03-27 MED ORDER — HEPARIN SODIUM (PORCINE) 5000 UNIT/ML IJ SOLN: 5000.0000 [IU] | Freq: Three times a day (TID) | INTRAMUSCULAR | Status: DC

## 2019-03-27 MED ORDER — ENSURE ENLIVE PO LIQD
237.0000 mL | Freq: Two times a day (BID) | ORAL | Status: DC
  Administered 2019-03-28 – 2019-04-03 (×11): 237 mL via ORAL

## 2019-03-27 MED ORDER — TAMOXIFEN CITRATE 10 MG PO TABS
20.0000 mg | ORAL_TABLET | Freq: Every day | ORAL | Status: DC
  Administered 2019-03-28 – 2019-04-03 (×7): 20 mg via ORAL
  Filled 2019-03-27 (×7): qty 2

## 2019-03-27 MED ORDER — HEPARIN SODIUM (PORCINE) 5000 UNIT/ML IJ SOLN
5000.0000 [IU] | Freq: Three times a day (TID) | INTRAMUSCULAR | Status: DC
  Administered 2019-03-27 – 2019-04-03 (×20): 5000 [IU] via SUBCUTANEOUS
  Filled 2019-03-27 (×20): qty 1

## 2019-03-27 MED ORDER — MENTHOL 3 MG MT LOZG: 1.0000 | LOZENGE | OROMUCOSAL | 12 refills | Status: DC | PRN

## 2019-03-27 MED ORDER — ATORVASTATIN CALCIUM 20 MG PO TABS: 20.0000 mg | ORAL_TABLET | Freq: Every day | ORAL | 0 refills | Status: DC

## 2019-03-27 MED ORDER — PANTOPRAZOLE SODIUM 40 MG PO TBEC
40.0000 mg | DELAYED_RELEASE_TABLET | Freq: Every day | ORAL | Status: DC
  Administered 2019-03-28 – 2019-04-03 (×7): 40 mg via ORAL
  Filled 2019-03-27 (×7): qty 1

## 2019-03-27 MED ORDER — POLYVINYL ALCOHOL 1.4 % OP SOLN
1.0000 [drp] | OPHTHALMIC | Status: DC | PRN
  Administered 2019-03-30 – 2019-04-03 (×2): 1 [drp] via OPHTHALMIC
  Filled 2019-03-27: qty 15

## 2019-03-27 MED ORDER — METOPROLOL TARTRATE 25 MG PO TABS
25.0000 mg | ORAL_TABLET | Freq: Two times a day (BID) | ORAL | Status: DC
  Administered 2019-03-27 – 2019-04-03 (×14): 25 mg via ORAL
  Filled 2019-03-27 (×14): qty 1

## 2019-03-27 MED ORDER — SORBITOL 70 % SOLN
30.0000 mL | Freq: Every day | Status: DC | PRN
  Administered 2019-03-28: 30 mL via ORAL
  Filled 2019-03-27: qty 30

## 2019-03-27 MED ORDER — DICLOFENAC SODIUM 1 % TD GEL
2.0000 g | Freq: Four times a day (QID) | TRANSDERMAL | Status: DC
  Administered 2019-03-27 – 2019-04-02 (×23): 2 g via TOPICAL
  Filled 2019-03-27: qty 100

## 2019-03-27 MED ORDER — ENSURE ENLIVE PO LIQD: 237.0000 mL | Freq: Two times a day (BID) | ORAL | 12 refills | Status: DC

## 2019-03-27 MED ORDER — ASPIRIN EC 81 MG PO TBEC
81.0000 mg | DELAYED_RELEASE_TABLET | Freq: Every day | ORAL | Status: DC
  Administered 2019-03-28 – 2019-04-03 (×7): 81 mg via ORAL
  Filled 2019-03-27 (×7): qty 1

## 2019-03-27 MED ORDER — LEVOTHYROXINE SODIUM 88 MCG PO TABS
88.0000 ug | ORAL_TABLET | Freq: Every day | ORAL | Status: DC
  Administered 2019-03-28: 88 ug via ORAL
  Filled 2019-03-27: qty 1

## 2019-03-27 MED ORDER — ATORVASTATIN CALCIUM 10 MG PO TABS
20.0000 mg | ORAL_TABLET | Freq: Every day | ORAL | Status: DC
  Administered 2019-03-28 – 2019-04-03 (×7): 20 mg via ORAL
  Filled 2019-03-27 (×7): qty 2

## 2019-03-27 MED ORDER — MAGIC MOUTHWASH
10.0000 mL | Freq: Three times a day (TID) | ORAL | Status: DC
  Administered 2019-03-27 – 2019-04-03 (×16): 10 mL via ORAL
  Filled 2019-03-27 (×22): qty 10

## 2019-03-27 MED ORDER — ACETAMINOPHEN 325 MG PO TABS
325.0000 mg | ORAL_TABLET | ORAL | Status: DC | PRN
  Administered 2019-03-28 – 2019-04-02 (×15): 650 mg via ORAL
  Administered 2019-04-02: 325 mg via ORAL
  Administered 2019-04-02 – 2019-04-03 (×3): 650 mg via ORAL
  Filled 2019-03-27 (×19): qty 2

## 2019-03-27 NOTE — Progress Notes (Signed)
Physical Therapy Treatment Patient Details Name: Stacy Walls MRN: [id number] DOB: 29-Mar-1933 Today's Date: 03/27/2019    History of Present Illness Pt is a 85 y.o. female with PMH of R breast CA, CKD III, DM2, HTN, depression, anxiety, UTI, presenting to ED 03/17/19 for evaluation of malaise, nausea, fatigue and headache. CT negative, COVID negative. Found with hyponatremia.    PT Comments    Pt in bed upon PT arrival, but agreeable to PT session. The pt was able to demo good bed mobility and transfers without assist, but she requires sig extra time and encouragement for all mobility. The pt was able to ambulate to the hallway and back, requiring standing rest breaks x3 for 30 ft amb. Pt SpO2 remained >92% through amb on RA, but pt reports sig fatigue following ambulation. The pt was thankful for mobility and assist, but will continue to benefit from skilled PT to address functional endurance and facilitate independence with mobility.    Follow Up Recommendations  SNF;Supervision/Assistance - 24 hour     Equipment Recommendations  Rolling walker with 5" wheels;Other (comment)(bariatric 3 in 1)    Recommendations for Other Services       Precautions / Restrictions Precautions Precautions: Fall Precaution Comments: Bowel incontinence Restrictions Weight Bearing Restrictions: No    Mobility  Bed Mobility Overal bed mobility: Needs Assistance Bed Mobility: Supine to Sit Rolling: Supervision   Supine to sit: Supervision;HOB elevated     General bed mobility comments: pt needs sig extra time, able to come to sitting EOB without assist.  Transfers Overall transfer level: Needs assistance Equipment used: Rolling walker (2 wheeled) Transfers: Sit to/from Stand Sit to Stand: Min guard         General transfer comment: no physical assistance for sit/stand, cues for rw management and hand placement  Ambulation/Gait Ambulation/Gait assistance: Min guard Gait  Distance (Feet): 30 Feet Assistive device: Rolling walker (2 wheeled) Gait Pattern/deviations: Step-to pattern;Wide base of support Gait velocity: 0.15 m/s Gait velocity interpretation: <1.31 ft/sec, indicative of household ambulator General Gait Details: short step to gait with widened BOS   Stairs             Wheelchair Mobility    Modified Rankin (Stroke Patients Only)       Balance Overall balance assessment: Needs assistance Sitting-balance support: Single extremity supported;Feet supported Sitting balance-Leahy Scale: Good Sitting balance - Comments: supervision   Standing balance support: Bilateral upper extremity supported Standing balance-Leahy Scale: Fair Standing balance comment: minG, reliant on BUE support                            Cognition Arousal/Alertness: Awake/alert Behavior During Therapy: WFL for tasks assessed/performed Overall Cognitive Status: Impaired/Different from baseline Area of Impairment: Problem solving                   Current Attention Level: Sustained   Following Commands: Follows one step commands inconsistently Safety/Judgement: Decreased awareness of safety;Decreased awareness of deficits Awareness: Intellectual Problem Solving: Slow processing;Decreased initiation;Requires verbal cues General Comments: pt required increased time to problem solve and benefits from one step commands with multimodal cues to sequence steps. pt very tangential and talking about family members throughout session, requires extra time for tasks      Exercises      General Comments        Pertinent Vitals/Pain Pain Assessment: No/denies pain    Home Living  Prior Function            PT Goals (current goals can now be found in the care plan section) Acute Rehab PT Goals Patient Stated Goal: to get stronger PT Goal Formulation: With patient Time For Goal Achievement: 04/02/19 Potential  to Achieve Goals: Good Progress towards PT goals: Progressing toward goals    Frequency    Min 3X/week      PT Plan Current plan remains appropriate    Co-evaluation              AM-PAC PT "6 Clicks" Mobility   Outcome Measure  Help needed turning from your back to your side while in a flat bed without using bedrails?: A Little Help needed moving from lying on your back to sitting on the side of a flat bed without using bedrails?: A Little Help needed moving to and from a bed to a chair (including a wheelchair)?: A Little Help needed standing up from a chair using your arms (e.g., wheelchair or bedside chair)?: A Little Help needed to walk in hospital room?: A Little Help needed climbing 3-5 steps with a railing? : A Lot 6 Click Score: 17    End of Session Equipment Utilized During Treatment: Gait belt Activity Tolerance: Patient tolerated treatment well;Patient limited by fatigue Patient left: in chair;with call bell/phone within reach;with chair alarm set Nurse Communication: Mobility status(need for purewick) PT Visit Diagnosis: Unsteadiness on feet (R26.81);Other abnormalities of gait and mobility (R26.89);Muscle weakness (generalized) (M62.81)     Time: 0102-7253 PT Time Calculation (min) (ACUTE ONLY): 27 min  Charges:  $Gait Training: 8-22 mins $Therapeutic Activity: 8-22 mins                     Mickey Farber, PT, DPT   Acute Rehabilitation Department [phone]   Otho Bellows 03/27/2019, 11:55 AM

## 2019-03-27 NOTE — Progress Notes (Signed)
Izora Ribas, MD  Physician  Physical Medicine and Rehabilitation  PMR Pre-admission  Signed  Date of Service:  03/27/2019 12:07 PM      Related encounter: ED to Hosp-Admission (Discharged) from 03/17/2019 in [facility]      Signed         Show:Clear all _0 Manual_1 Template_2 Copied  Added by: _3 Cristina Gong, RN_4 Ranell Patrick Clide Deutscher, MD  _5 Hover for details PMR Admission Coordinator Pre-Admission Assessment  Patient: Jamika Sadek is an 85 y.o., female MRN: [id number] DOB: 1932/11/17 Height: _6  (157.5 cm) Weight: 106.5 kg(scale b)  Insurance Information HMO: yes    PPO:      PCP:      IPA:      80/20:      OTHER:  PRIMARY: U[employer] Medicare      Policy#: [id number]      Subscriber: pt CM Name: Jannifer Hick (denied after peer to peer. Overturned on expedited appeal )approved until 12/11 when updates are due     Phone#: [phone]     Fax#: [phone] Pre-Cert#: [id number] updates due 12/11   Employer: Benefits:  Phone #: [phone]     Name: 12/5 Eff. Date: 02/19/2019     Deduct: none      Out of Pocket Max: $4500      Life Max: none CIR: $325 co pay per day days 1 until 5      SNF: no co pay per day days 1 until 20; $160 co pay per day days 21 until 49; no co pay per day days 50 until 100 Outpatient: $35 per visit     Co-Pay: visits per medical neccesity Home Health: 100%      Co-Pay: visits per medical neccesity DME: 80 %    Co-Pay: 20% Providers: in network  SECONDARY: none      Medicaid Application Date:       Case Manager:  Disability Application Date:       Case Worker:   The "Data Collection Information Summary" for patients in Inpatient Rehabilitation Facilities with attached "Privacy Act [place] Records" was provided and verbally reviewed with: Patient and Family  Emergency Contact Information         Contact Information    Name Relation Home Work Mobile   [place], Stanton Kidney "[place]" Daughter  [phone]  [phone]   Allyne, Hebert [phone]  [phone]      Current Medical History  Patient Admitting Diagnosis: debility  History of Present Illness: 85 year old right-handed female with significant history of right breast cancer diagnosed December 2019 status post radiation andadjuvanttamoxifen followed by Dr. Burr Medico, diastolic congestive heart failure, anemia of chronic disease, chronic kidney disease stage III followed by renal services Dr. Justin Mend, hypertension, type 2 diabetes mellitus, depression, anxiety and recent UTIcompleting a course of Levaquin. Presented 03/18/2019 with malaise, headache, nausea, decreased appetite. Admission labs hemoglobin 10.5, sodium 124, creatinine 2.16 with latest of 1.683 weeks ago, troponin negative, urinalysis negative nitrite, SARS coronavirus negative, TSH 5.807. Cranial CT scan/MRI negative for acute changes. Chest x-ray no edema or consolidation. CT abdomen pelvis no acute abdominopelvic findings. Extensive postsurgical posttreatment changes to the right breast that was reviewed by oncology services Dr.Fengshowing no evidence of cancer recurrence. Patient's HCTZ was held due to elevated creatinine and hyponatremia with renal services consulted. Sodium is slowly improving 129-131and latest creatinine 1.65. Patient does remain on low-dose Lasix at the recommendations of renal services. Subcutaneous heparin for DVT prophylaxis. Latest hemoglobin 8.6and monitoring for  any bleeding episodes.C. difficile negative. Patient is tolerating a mechanical soft diet with fluid restriction.Palliative care consulted to establish goals of care.  Patient[facility] from [facility]  has been reviewed by the rehabilitation admission coordinator and physician.  Past Medical History      Past Medical History:  Diagnosis Date  . Acute cholecystitis   . Allergic rhinitis   . Anemia   . Arthritis   . Cancer  [facility])    Right breast  . Chest pain   . Chronic renal insufficiency    followed by Dr Justin Mend stage 3  . Clostridium difficile colitis   . Constipation   . Coronary atherosclerosis of native coronary vessel   . Cough   . Depression    situational - husband died 06/04/18  . Diabetes mellitus   . Dizziness   . DM2 (diabetes mellitus, type 2) ([place])   . Dyshidrosis   . Family history of adverse reaction to anesthesia    daughter - nausea  . GERD (gastroesophageal reflux disease)   . Gout   . HLD (hyperlipidemia)   . HTN (hypertension)   . Hypothyroidism   . Neck mass   . Neck pain   . Osteoporosis   . Routine general medical examination at a health care facility   . Secondary hyperparathyroidism (of renal origin)   . Urinary incontinence   . Urinary tract infection     Family History   family history includes Cancer in her brother; Coronary artery disease in her father; Diabetes in her brother, father, and sister; Stroke in her father and sister.  Prior Rehab/Hospitalizations Has the patient had prior rehab or hospitalizations prior to admission? Yes  Has the patient had major surgery during 100 days prior to admission? No              Current Medications  Current Facility-Administered Medications:  .  acetaminophen (TYLENOL) tablet 650 mg, 650 mg, Oral, TID, Lane Hacker L, DO, 650 mg at 03/27/19 0913 .  aspirin EC tablet 81 mg, 81 mg, Oral, Daily, Alma Friendly, MD, 81 mg at 03/27/19 0914 .  atorvastatin (LIPITOR) tablet 20 mg, 20 mg, Oral, Daily, Lane Hacker L, DO, 20 mg at 03/27/19 0913 .  bisacodyl (DULCOLAX) suppository 10 mg, 10 mg, Rectal, Once, Lane Hacker L, DO .  diclofenac sodium (VOLTAREN) 1 % transdermal gel 2 g, 2 g, Topical, QID, Alma Friendly, MD, 2 g at 03/27/19 0919 .  feeding supplement (ENSURE ENLIVE) (ENSURE ENLIVE) liquid 237 mL, 237 mL, Oral, BID BM, Alma Friendly, MD, 237 mL at  03/27/19 0920 .  ferrous sulfate tablet 325 mg, 325 mg, Oral, Daily, Alma Friendly, MD, 325 mg at 03/27/19 0914 .  fluticasone (FLONASE) 50 MCG/ACT nasal spray 2 spray, 2 spray, Each Nare, Daily, Alma Friendly, MD, 2 spray at 03/27/19 0919 .  furosemide (LASIX) tablet 20 mg, 20 mg, Oral, Daily, Donato Heinz, MD, 20 mg at 03/27/19 0913 .  guaiFENesin (ROBITUSSIN) 100 MG/5ML solution 100 mg, 5 mL, Oral, Q4H PRN, Swayze, Ava, DO, 100 mg at 03/26/19 1503 .  heparin injection 5,000 Units, 5,000 Units, Subcutaneous, Q8H, Opyd, Ilene Qua, MD, 5,000 Units at 03/27/19 0645 .  hydrALAZINE (APRESOLINE) tablet 50 mg, 50 mg, Oral, BID, Alma Friendly, MD, 50 mg at 03/27/19 0913 .  insulin aspart (novoLOG) injection 0-9 Units, 0-9 Units, Subcutaneous, TID WC, Opyd, Ilene Qua, MD, 1 Units at 03/26/19 1807 .  lactulose (  CHRONULAC) 10 GM/15ML solution 10 g, 10 g, Oral, Once, Lane Hacker L, DO .  levothyroxine (SYNTHROID) tablet 88 mcg, 88 mcg, Oral, Q0600, Alma Friendly, MD, 88 mcg at 03/27/19 0645 .  liothyronine (CYTOMEL) tablet 5 mcg, 5 mcg, Oral, Daily, Lane Hacker L, DO, 5 mcg at 03/27/19 9702 .  loperamide (IMODIUM) capsule 2 mg, 2 mg, Oral, PRN, Swayze, Ava, DO .  magic mouthwash, 10 mL, Oral, TID, Hilma Favors, Elizabeth L, DO, 10 mL at 03/27/19 0919 .  menthol-cetylpyridinium (CEPACOL) lozenge 3 mg, 1 lozenge, Oral, PRN, Swayze, Ava, DO, 3 mg at 03/27/19 0652 .  metoprolol tartrate (LOPRESSOR) tablet 25 mg, 25 mg, Oral, BID, Swayze, Ava, DO, 25 mg at 03/27/19 0913 .  ondansetron (ZOFRAN) injection 4 mg, 4 mg, Intravenous, Q4H PRN, Lane Hacker L, DO, 4 mg at 03/21/19 1446 .  pantoprazole (PROTONIX) EC tablet 40 mg, 40 mg, Oral, Daily, Alma Friendly, MD, 40 mg at 03/27/19 0913 .  polyvinyl alcohol (LIQUIFILM TEARS) 1.4 % ophthalmic solution 1 drop, 1 drop, Both Eyes, PRN, Horris Latino, Adline Peals, MD .  sodium chloride flush (NS) 0.9 % injection 3 mL, 3 mL,  Intravenous, Q12H, Opyd, Ilene Qua, MD, 3 mL at 03/27/19 0920 .  tamoxifen (NOLVADEX) tablet 20 mg, 20 mg, Oral, Daily, Opyd, Ilene Qua, MD, 20 mg at 03/27/19 0913  Patients Current Diet:     Diet Order                  Diet - low sodium heart healthy         Diet Carb Modified         DIET DYS 3 Room service appropriate? Yes with Assist; Fluid consistency: Thin; Fluid restriction: 1500 mL Fluid  Diet effective now               Precautions / Restrictions Precautions Precautions: Fall Precaution Comments: Bowel incontinence Restrictions Weight Bearing Restrictions: No   Has the patient had 2 or more falls or a fall with injury in the past year? No  Prior Activity Level Community (5-7x/wk): Independent pta  Prior Functional Level Self Care: Did the patient need help bathing, dressing, using the toilet or eating? Independent  Indoor Mobility: Did the patient need assistance with walking from room to room (with or without device)? Independent  Stairs: Did the patient need assistance with internal or external stairs (with or without device)? Independent  Functional Cognition: Did the patient need help planning regular tasks such as shopping or remembering to take medications? [place] Devices/Equipment: Cane (specify quad or straight) Home Equipment: Cane - single point, Bedside commode  Prior Device Use: Indicate devices/aids used by the patient prior to current illness, exacerbation or injury? cane  Current Functional Level Cognition  Overall Cognitive Status: Impaired/Different from baseline Current Attention Level: Sustained Orientation Level: Oriented X4 Following Commands: Follows one step commands inconsistently Safety/Judgement: Decreased awareness of safety, Decreased awareness of deficits General Comments: pt required increased time to problem solve and benefits from one step  commands with multimodal cues to sequence steps. pt very tangential and talking about family members throughout session, requires extra time for tasks    Extremity Assessment (includes Sensation/Coordination)  Upper Extremity Assessment: Defer to OT evaluation  Lower Extremity Assessment: Generalized weakness    ADLs  Overall ADL's : Needs assistance/impaired Grooming: Set up, Sitting, Wash/dry face Upper Body Bathing: Set up, Sitting Lower Body Bathing: +2 for physical assistance,  Sit to/from stand, Moderate assistance Lower Body Bathing Details (indicate cue type and reason): assist to reach feet and buttocks, min assist +2 stand  Upper Body Dressing : Minimal assistance, Sitting Lower Body Dressing: Moderate assistance, Sitting/lateral leans Lower Body Dressing Details (indicate cue type and reason): don and adj. socks while seated eob Toilet Transfer: Stand-pivot, BSC, RW, Min guard, Minimal assistance Toilet Transfer Details (indicate cue type and reason): simulated to recliner  Toileting- Clothing Manipulation and Hygiene: Total assistance, Sit to/from stand, Min guard Toileting - Clothing Manipulation Details (indicate cue type and reason): reports prior to admission she had no issues with pericare says she can not remember how she was doing it but knows she did not need assistance, able to clean front peri area without assistance in standing but required total a for buttocks in standing Functional mobility during ADLs: Minimal assistance, Rolling walker General ADL Comments: reports not sleeping well but agreeable to oob for toileting and seated in recliner for breakfast.    Mobility  Overal bed mobility: Needs Assistance Bed Mobility: Supine to Sit Rolling: Supervision Supine to sit: Supervision, HOB elevated Sit to supine: HOB elevated, Mod assist, +2 for safety/equipment General bed mobility comments: pt needs sig extra time, able to come to sitting EOB without assist.     Transfers  Overall transfer level: Needs assistance Equipment used: Rolling walker (2 wheeled) Transfers: Sit to/from Stand Sit to Stand: Min guard Stand pivot transfers: Min assist General transfer comment: no physical assistance for sit/stand, cues for rw management and hand placement    Ambulation / Gait / Stairs / Wheelchair Mobility  Ambulation/Gait Ambulation/Gait assistance: Counsellor (Feet): 30 Feet Assistive device: Rolling walker (2 wheeled) Gait Pattern/deviations: Step-to pattern, Wide base of support General Gait Details: short step to gait with widened BOS Gait velocity: 0.15 m/s Gait velocity interpretation: <1.31 ft/sec, indicative of household ambulator    Posture / Balance Dynamic Sitting Balance Sitting balance - Comments: supervision Balance Overall balance assessment: Needs assistance Sitting-balance support: Single extremity supported, Feet supported Sitting balance-Leahy Scale: Good Sitting balance - Comments: supervision Standing balance support: Bilateral upper extremity supported Standing balance-Leahy Scale: Fair Standing balance comment: minG, reliant on BUE support    Special needs/care consideration BiPAP/CPAP  CPM  Continuous Drip IV  Dialysis         Days  Life Vest  Oxygen  Special Bed  Trach Size  Wound Vac (area)       Location  Skin                              Location  Bowel mgmt: incontinent 12/2 Bladder mgmt: external catheter Diabetic mgmt: yes behavioral consideration confusion during admission which is not her baseline Chemo/radiation  Designated visitor is son, Dr. Cecille Rubin   Previous Home Environment (from acute therapy documentation) Living Arrangements: Alone  Lives With: Alone Available Help at Discharge: Family, Available 24 hours/day(son and daughter can come assist) Type of Home: House Home Layout: One level Home Access: Stairs to enter [employer] of Steps: 1 Bathroom  Shower/Tub: Public librarian, Architectural technologist: Handicapped height Bathroom Accessibility: Yes How Accessible: Accessible via walker [place]: No  Discharge Living Setting Plans for Discharge Living Setting: Patient's home, Alone Type of Home at Discharge: House Discharge Home Layout: One level Discharge Home Access: Stairs to enter Entrance Stairs-Rails: None Entrance Stairs-Number of Steps: 1 Discharge Bathroom Shower/Tub: Tub/shower unit,  Curtain Discharge Bathroom Toilet: Standard Discharge Bathroom Accessibility: Yes How Accessible: Accessible via walker Does the patient have any problems obtaining your medications?: No  Social/Family/Support Systems Patient Roles: Parent Contact Information: son, Dr. Tasia Catchings is main contact Anticipated Caregiver: son and daughter Anticipated Caregiver's Contact Information: see above Ability/Limitations of Caregiver: son and daughter would alternate Caregiver Availability: 24/7 Discharge Plan Discussed with Primary Caregiver: Yes Is Caregiver In Agreement with Plan?: Yes Does Caregiver/Family have Issues with Lodging/Transportation while Pt is in Rehab?: No  Goals/Additional Needs Patient/Family Goal for Rehab: Mod I to supervision with PT, OT, and SLP Expected length of stay: ELOS 5 to 7 days Pt/Family Agrees to Admission and willing to participate: Yes Program Orientation Provided & Reviewed with Pt/Caregiver Including Roles  & Responsibilities: Yes  Decrease burden of Care through IP rehab admission:   Possible need for SNF placement upon discharge: not anticipated  Patient Condition: I have reviewed medical records from [facility] , spoken with CM, and patient and son. I met with patient at the bedside for inpatient rehabilitation assessment.  Patient will benefit from ongoing PT, OT and SLP, can actively participate in 3 hours of therapy a day 5 days of the week, and can make measurable gains during  the admission.  Patient will also benefit from the coordinated team approach during an Inpatient Acute Rehabilitation admission.  The patient will receive intensive therapy as well as Rehabilitation physician, nursing, social worker, and care management interventions.  Due to bladder management, bowel management, safety, skin/wound care, disease management, medication administration, pain management and patient education the patient requires 24 hour a day rehabilitation nursing.  The patient is currently min assist with mobility and basic ADLs.  Discharge setting and therapy post discharge at home with home health is anticipated.  Patient has agreed to participate in the Acute Inpatient Rehabilitation Program and will admit today.  Preadmission Screen Completed By:  Cleatrice Burke, 03/27/2019 12:07 PM ______________________________________________________________________   Discussed status with Dr. Ranell Patrick on 03/27/2019 at  65 and received approval for admission today.  Admission Coordinator:  Cleatrice Burke, RN, time 3094 Date  03/27/2019   Assessment/Plan: Diagnosis: Impaired mobility and ADLs secondary to debility 1. Does the need for close, 24 hr/day Medical supervision in concert with the patient's rehab needs make it unreasonable for this patient to be served in a less intensive setting? Yes 2. Co-Morbidities requiring supervision/potential complications: right breast cancer diagnosed December 2019 s/p radiation and Tamoxifen, diastolic CHF, anemia of chronic disease, CKD stage 3, type 2 DM, depression, anxiety, recent UTI, morbid obesity, hyponatremia 3. Due to safety, skin/wound care, disease management, medication administration, pain management and patient education, does the patient require 24 hr/day rehab nursing? Yes 4. Does the patient require coordinated care of a physician, rehab nurse, PT, OT, and SLP to address physical and functional deficits in the context of the  above medical diagnosis(es)? Yes Addressing deficits in the following areas: balance, endurance, locomotion, strength, transferring, bowel/bladder control, bathing, dressing, feeding, grooming, toileting and psychosocial support 5. Can the patient actively participate in an intensive therapy program of at least 3 hrs of therapy 5 days a week? Yes 6. The potential for patient to make measurable gains while on inpatient rehab is excellent 7. Anticipated functional outcomes upon discharge from inpatient rehab: modified independent PT, modified independent OT, independent SLP 8. Estimated rehab length of stay to reach the above functional goals is: 10-14 days. 9. Anticipated discharge destination: Home 10. Overall Rehab/Functional Prognosis:  excellent   MD Signature: Leeroy Cha, MD        Revision History

## 2019-03-27 NOTE — Plan of Care (Signed)

## 2019-03-27 NOTE — Plan of Care (Signed)
  Problem: Consults Goal: RH GENERAL PATIENT EDUCATION Description: See Patient Education module for education specifics. Outcome: Progressing Goal: Nutrition Consult-if indicated Outcome: Progressing   Problem: RH BOWEL ELIMINATION Goal: RH STG MANAGE BOWEL WITH ASSISTANCE Description: STG Manage Bowel with min Assistance. Outcome: Progressing Flowsheets (Taken 03/27/2019 1846) STG: Pt will manage bowels with assistance: 4-Minimum assistance Goal: RH STG MANAGE BOWEL W/MEDICATION W/ASSISTANCE Description: STG Manage Bowel with Medication with mod I Assistance. Outcome: Progressing Flowsheets (Taken 03/27/2019 1846) STG: Pt will manage bowels with medication with assistance: 4-Minimal assistance   Problem: RH BLADDER ELIMINATION Goal: RH STG MANAGE BLADDER WITH ASSISTANCE Description: STG Manage Bladder With min Assistance Outcome: Progressing Flowsheets (Taken 03/27/2019 1846) STG: Pt will manage bladder with assistance: 4-Minimal assistance   Problem: RH SKIN INTEGRITY Goal: RH STG SKIN FREE OF INFECTION/BREAKDOWN Description: Pt will remain free of skin breakdown with min assist Outcome: Progressing   Problem: RH SAFETY Goal: RH STG ADHERE TO SAFETY PRECAUTIONS W/ASSISTANCE/DEVICE Description: STG Adhere to Safety Precautions With supervision Assistance/Device. Outcome: Progressing Flowsheets (Taken 03/27/2019 1846) STG:Pt will adhere to safety precautions with assistance/device: 4-Minimal assistance Goal: RH STG DECREASED RISK OF FALL WITH ASSISTANCE Description: STG Decreased Risk of Fall With reminders/cues  Assistance. Outcome: Progressing Flowsheets (Taken 03/27/2019 1846) [id number] risk of fall  with assistance/device: 4-Minimal assistance   Problem: RH KNOWLEDGE DEFICIT GENERAL Goal: RH STG INCREASE KNOWLEDGE OF SELF CARE AFTER HOSPITALIZATION Description: Pt and family will be able to demonstrate safety and fall precautions for home and compliance and  understanding of medications using handouts and teaching education.  Outcome: Progressing

## 2019-03-27 NOTE — PMR Pre-admission (Signed)
PMR Admission Coordinator Pre-Admission Assessment  Patient: Stacy Walls is an 85 y.o., female MRN: [id number] DOB: 22-Sep-1932 Height: 5' 2" (157.5 cm) Weight: 106.5 kg(scale b)  Insurance Information HMO: yes    PPO:      PCP:      IPA:      80/20:      OTHER:  PRIMARY: [employer] Medicare      Policy#: [id number]      Subscriber: pt CM Name: Jannifer Hick (denied after peer to peer. Overturned on expedited appeal )approved until 12/11 when updates are due     Phone#: [phone]     Fax#: [phone] Pre-Cert#: [id number] updates due 12/11   Employer: Benefits:  Phone #: [phone]     Name: 12/5 Eff. Date: 02/19/2019     Deduct: none      Out of Pocket Max: $4500      Life Max: none CIR: $325 co pay per day days 1 until 5      SNF: no co pay per day days 1 until 20; $160 co pay per day days 21 until 49; no co pay per day days 50 until 100 Outpatient: $35 per visit     Co-Pay: visits per medical neccesity Home Health: 100%      Co-Pay: visits per medical neccesity DME: 80 %    Co-Pay: 20% Providers: in network  SECONDARY: none      Medicaid Application Date:       Case Manager:  Disability Application Date:       Case Worker:   The "Data Collection Information Summary" for patients in Inpatient Rehabilitation Facilities with attached "Privacy Act [place] Records" was provided and verbally reviewed with: Patient and Family  Emergency Contact Information Contact Information    Name Relation Home Work Mobile   [place]land, Stanton Kidney "[place]" Daughter [phone]  [phone]   Dezyre, Hoefer [phone]  [phone]      Current Medical History  Patient Admitting Diagnosis: debility  History of Present Illness: 85 year old right-handed female with significant history of right breast cancer diagnosed December 2019 status post radiation and adjuvant tamoxifen followed by Dr. Burr Medico, diastolic congestive heart failure, anemia of chronic disease, chronic kidney  disease stage III followed by renal services Dr. Justin Mend, hypertension, type 2 diabetes mellitus, depression, anxiety and recent UTI completing a course of Levaquin. Presented 03/18/2019 with malaise, headache, nausea, decreased appetite.  Admission labs hemoglobin 10.5, sodium 124, creatinine 2.16 with latest of 1.683 weeks ago, troponin negative, urinalysis negative nitrite, SARS coronavirus negative, TSH 5.807.  Cranial CT scan/MRI negative for acute changes.  Chest x-ray no edema or consolidation.  CT abdomen pelvis no acute abdominopelvic findings.  Extensive postsurgical posttreatment changes to the right breast that was reviewed by oncology services Dr.Feng showing no evidence of cancer recurrence.  Patient's HCTZ was held due to elevated creatinine and hyponatremia with renal services consulted.  Sodium is slowly improving 129-131 and latest creatinine 1.65.  Patient does remain on low-dose Lasix at the recommendations of renal services.  Subcutaneous heparin for DVT prophylaxis.  Latest hemoglobin 8.6 and monitoring for any bleeding episodes.  C. difficile negative.  Patient is tolerating a mechanical soft diet with fluid restriction.  Palliative care consulted to establish goals of care.    Patient's medical record from [facility]  has been reviewed by the rehabilitation admission coordinator and physician.  Past Medical History  Past Medical History:  Diagnosis Date  . Acute cholecystitis   . Allergic  rhinitis   . Anemia   . Arthritis   . Cancer [facility])    Right breast  . Chest pain   . Chronic renal insufficiency    followed by Dr Justin Mend stage 3  . Clostridium difficile colitis   . Constipation   . Coronary atherosclerosis of native coronary vessel   . Cough   . Depression    situational - husband died May 19, 2018  . Diabetes mellitus   . Dizziness   . DM2 (diabetes mellitus, type 2) ([place])   . Dyshidrosis   . Family history of adverse reaction to anesthesia    daughter - nausea   . GERD (gastroesophageal reflux disease)   . Gout   . HLD (hyperlipidemia)   . HTN (hypertension)   . Hypothyroidism   . Neck mass   . Neck pain   . Osteoporosis   . Routine general medical examination at a health care facility   . Secondary hyperparathyroidism (of renal origin)   . Urinary incontinence   . Urinary tract infection     Family History   family history includes Cancer in her brother; Coronary artery disease in her father; Diabetes in her brother, father, and sister; Stroke in her father and sister.  Prior Rehab/Hospitalizations Has the patient had prior rehab or hospitalizations prior to admission? Yes  Has the patient had major surgery during 100 days prior to admission? No   Current Medications  Current Facility-Administered Medications:  .  acetaminophen (TYLENOL) tablet 650 mg, 650 mg, Oral, TID, Lane Hacker L, DO, 650 mg at 03/27/19 0913 .  aspirin EC tablet 81 mg, 81 mg, Oral, Daily, Alma Friendly, MD, 81 mg at 03/27/19 0914 .  atorvastatin (LIPITOR) tablet 20 mg, 20 mg, Oral, Daily, Lane Hacker L, DO, 20 mg at 03/27/19 0913 .  bisacodyl (DULCOLAX) suppository 10 mg, 10 mg, Rectal, Once, Lane Hacker L, DO .  diclofenac sodium (VOLTAREN) 1 % transdermal gel 2 g, 2 g, Topical, QID, Alma Friendly, MD, 2 g at 03/27/19 0919 .  feeding supplement (ENSURE ENLIVE) (ENSURE ENLIVE) liquid 237 mL, 237 mL, Oral, BID BM, Alma Friendly, MD, 237 mL at 03/27/19 0920 .  ferrous sulfate tablet 325 mg, 325 mg, Oral, Daily, Alma Friendly, MD, 325 mg at 03/27/19 0914 .  fluticasone (FLONASE) 50 MCG/ACT nasal spray 2 spray, 2 spray, Each Nare, Daily, Alma Friendly, MD, 2 spray at 03/27/19 0919 .  furosemide (LASIX) tablet 20 mg, 20 mg, Oral, Daily, Donato Heinz, MD, 20 mg at 03/27/19 0913 .  guaiFENesin (ROBITUSSIN) 100 MG/5ML solution 100 mg, 5 mL, Oral, Q4H PRN, Swayze, Ava, DO, 100 mg at 03/26/19 1503 .  heparin  injection 5,000 Units, 5,000 Units, Subcutaneous, Q8H, Opyd, Ilene Qua, MD, 5,000 Units at 03/27/19 0645 .  hydrALAZINE (APRESOLINE) tablet 50 mg, 50 mg, Oral, BID, Alma Friendly, MD, 50 mg at 03/27/19 0913 .  insulin aspart (novoLOG) injection 0-9 Units, 0-9 Units, Subcutaneous, TID WC, Opyd, Ilene Qua, MD, 1 Units at 03/26/19 1807 .  lactulose (CHRONULAC) 10 GM/15ML solution 10 g, 10 g, Oral, Once, Lane Hacker L, DO .  levothyroxine (SYNTHROID) tablet 88 mcg, 88 mcg, Oral, Q0600, Alma Friendly, MD, 88 mcg at 03/27/19 0645 .  liothyronine (CYTOMEL) tablet 5 mcg, 5 mcg, Oral, Daily, Lane Hacker L, DO, 5 mcg at 03/27/19 9458 .  loperamide (IMODIUM) capsule 2 mg, 2 mg, Oral, PRN, Swayze, Ava, DO .  magic mouthwash, 10 mL, Oral,  TID, Lane Hacker L, DO, 10 mL at 03/27/19 0919 .  menthol-cetylpyridinium (CEPACOL) lozenge 3 mg, 1 lozenge, Oral, PRN, Swayze, Ava, DO, 3 mg at 03/27/19 0652 .  metoprolol tartrate (LOPRESSOR) tablet 25 mg, 25 mg, Oral, BID, Swayze, Ava, DO, 25 mg at 03/27/19 0913 .  ondansetron (ZOFRAN) injection 4 mg, 4 mg, Intravenous, Q4H PRN, Lane Hacker L, DO, 4 mg at 03/21/19 1446 .  pantoprazole (PROTONIX) EC tablet 40 mg, 40 mg, Oral, Daily, Alma Friendly, MD, 40 mg at 03/27/19 0913 .  polyvinyl alcohol (LIQUIFILM TEARS) 1.4 % ophthalmic solution 1 drop, 1 drop, Both Eyes, PRN, Horris Latino, Adline Peals, MD .  sodium chloride flush (NS) 0.9 % injection 3 mL, 3 mL, Intravenous, Q12H, Opyd, Ilene Qua, MD, 3 mL at 03/27/19 0920 .  tamoxifen (NOLVADEX) tablet 20 mg, 20 mg, Oral, Daily, Opyd, Ilene Qua, MD, 20 mg at 03/27/19 0913  Patients Current Diet:  Diet Order            Diet - low sodium heart healthy        Diet Carb Modified        DIET DYS 3 Room service appropriate? Yes with Assist; Fluid consistency: Thin; Fluid restriction: 1500 mL Fluid  Diet effective now              Precautions /  Restrictions Precautions Precautions: Fall Precaution Comments: Bowel incontinence Restrictions Weight Bearing Restrictions: No   Has the patient had 2 or more falls or a fall with injury in the past year? No  Prior Activity Level Community (5-7x/wk): Independent pta  Prior Functional Level Self Care: Did the patient need help bathing, dressing, using the toilet or eating? Independent  Indoor Mobility: Did the patient need assistance with walking from room to room (with or without device)? Independent  Stairs: Did the patient need assistance with internal or external stairs (with or without device)? Independent  Functional Cognition: Did the patient need help planning regular tasks such as shopping or remembering to take medications? [place] Devices/Equipment: Cane (specify quad or straight) Home Equipment: Cane - single point, Bedside commode  Prior Device Use: Indicate devices/aids used by the patient prior to current illness, exacerbation or injury? cane  Current Functional Level Cognition  Overall Cognitive Status: Impaired/Different from baseline Current Attention Level: Sustained Orientation Level: Oriented X4 Following Commands: Follows one step commands inconsistently Safety/Judgement: Decreased awareness of safety, Decreased awareness of deficits General Comments: pt required increased time to problem solve and benefits from one step commands with multimodal cues to sequence steps. pt very tangential and talking about family members throughout session, requires extra time for tasks    Extremity Assessment (includes Sensation/Coordination)  Upper Extremity Assessment: Defer to OT evaluation  Lower Extremity Assessment: Generalized weakness    ADLs  Overall ADL's : Needs assistance/impaired Grooming: Set up, Sitting, Wash/dry face Upper Body Bathing: Set up, Sitting Lower Body Bathing: +2 for physical  assistance, Sit to/from stand, Moderate assistance Lower Body Bathing Details (indicate cue type and reason): assist to reach feet and buttocks, min assist +2 stand  Upper Body Dressing : Minimal assistance, Sitting Lower Body Dressing: Moderate assistance, Sitting/lateral leans Lower Body Dressing Details (indicate cue type and reason): don and adj. socks while seated eob Toilet Transfer: Stand-pivot, BSC, RW, Min guard, Minimal assistance Toilet Transfer Details (indicate cue type and reason): simulated to recliner  Toileting- Clothing Manipulation and Hygiene: Total assistance, Sit to/from stand, Min  guard Toileting - Clothing Manipulation Details (indicate cue type and reason): reports prior to admission she had no issues with pericare says she can not remember how she was doing it but knows she did not need assistance, able to clean front peri area without assistance in standing but required total a for buttocks in standing Functional mobility during ADLs: Minimal assistance, Rolling walker General ADL Comments: reports not sleeping well but agreeable to oob for toileting and seated in recliner for breakfast.    Mobility  Overal bed mobility: Needs Assistance Bed Mobility: Supine to Sit Rolling: Supervision Supine to sit: Supervision, HOB elevated Sit to supine: HOB elevated, Mod assist, +2 for safety/equipment General bed mobility comments: pt needs sig extra time, able to come to sitting EOB without assist.    Transfers  Overall transfer level: Needs assistance Equipment used: Rolling walker (2 wheeled) Transfers: Sit to/from Stand Sit to Stand: Min guard Stand pivot transfers: Min assist General transfer comment: no physical assistance for sit/stand, cues for rw management and hand placement    Ambulation / Gait / Stairs / Wheelchair Mobility  Ambulation/Gait Ambulation/Gait assistance: Counsellor (Feet): 30 Feet Assistive device: Rolling walker (2 wheeled) Gait  Pattern/deviations: Step-to pattern, Wide base of support General Gait Details: short step to gait with widened BOS Gait velocity: 0.15 m/s Gait velocity interpretation: <1.31 ft/sec, indicative of household ambulator    Posture / Balance Dynamic Sitting Balance Sitting balance - Comments: supervision Balance Overall balance assessment: Needs assistance Sitting-balance support: Single extremity supported, Feet supported Sitting balance-Leahy Scale: Good Sitting balance - Comments: supervision Standing balance support: Bilateral upper extremity supported Standing balance-Leahy Scale: Fair Standing balance comment: minG, reliant on BUE support    Special needs/care consideration BiPAP/CPAP  CPM  Continuous Drip IV  Dialysis         Days  Life Vest  Oxygen  Special Bed  Trach Size  Wound Vac (area)       Location  Skin                              Location  Bowel mgmt: incontinent 12/2 Bladder mgmt: external catheter Diabetic mgmt: yes behavioral consideration confusion during admission which is not her baseline Chemo/radiation  Designated visitor is son, Dr. Cecille Rubin   Previous Home Environment (from acute therapy documentation) Living Arrangements: Alone  Lives With: Alone Available Help at Discharge: Family, Available 24 hours/day(son and daughter can come assist) Type of Home: House Home Layout: One level Home Access: Stairs to enter [employer] of Steps: 1 Bathroom Shower/Tub: Public librarian, Architectural technologist: Handicapped height Bathroom Accessibility: Yes How Accessible: Accessible via walker Pettibone: No  Discharge Living Setting Plans for Discharge Living Setting: Patient's home, Alone Type of Home at Discharge: House Discharge Home Layout: One level Discharge Home Access: Stairs to enter Entrance Stairs-Rails: None Entrance Stairs-Number of Steps: 1 Discharge Bathroom Shower/Tub: Tub/shower unit, Curtain Discharge  Bathroom Toilet: Standard Discharge Bathroom Accessibility: Yes How Accessible: Accessible via walker Does the patient have any problems obtaining your medications?: No  Social/Family/Support Systems Patient Roles: Parent Contact Information: son, Dr. Tasia Catchings is main contact Anticipated Caregiver: son and daughter Anticipated Caregiver's Contact Information: see above Ability/Limitations of Caregiver: son and daughter would alternate Caregiver Availability: 24/7 Discharge Plan Discussed with Primary Caregiver: Yes Is Caregiver In Agreement with Plan?: Yes Does Caregiver/Family have Issues with Lodging/Transportation while Pt is in Rehab?: No  Goals/Additional Needs Patient/Family Goal for Rehab: Mod I to supervision with PT, OT, and SLP Expected length of stay: ELOS 5 to 7 days Pt/Family Agrees to Admission and willing to participate: Yes Program Orientation Provided & Reviewed with Pt/Caregiver Including Roles  & Responsibilities: Yes  Decrease burden of Care through IP rehab admission:   Possible need for SNF placement upon discharge: not anticipated  Patient Condition: I have reviewed medical records from [facility] , spoken with CM, and patient and son. I met with patient at the bedside for inpatient rehabilitation assessment.  Patient will benefit from ongoing PT, OT and SLP, can actively participate in 3 hours of therapy a day 5 days of the week, and can make measurable gains during the admission.  Patient will also benefit from the coordinated team approach during an Inpatient Acute Rehabilitation admission.  The patient will receive intensive therapy as well as Rehabilitation physician, nursing, social worker, and care management interventions.  Due to bladder management, bowel management, safety, skin/wound care, disease management, medication administration, pain management and patient education the patient requires 24 hour a day rehabilitation nursing.  The patient is  currently min assist with mobility and basic ADLs.  Discharge setting and therapy post discharge at home with home health is anticipated.  Patient has agreed to participate in the Acute Inpatient Rehabilitation Program and will admit today.  Preadmission Screen Completed By:  Cleatrice Burke, 03/27/2019 12:07 PM ______________________________________________________________________   Discussed status with Dr. Ranell Patrick on 03/27/2019 at  31 and received approval for admission today.  Admission Coordinator:  Cleatrice Burke, RN, time 2060 Date  03/27/2019   Assessment/Plan: Diagnosis: Impaired mobility and ADLs secondary to debility 1. Does the need for close, 24 hr/day Medical supervision in concert with the patient's rehab needs make it unreasonable for this patient to be served in a less intensive setting? Yes 2. Co-Morbidities requiring supervision/potential complications: right breast cancer diagnosed December 2019 s/p radiation and Tamoxifen, diastolic CHF, anemia of chronic disease, CKD stage 3, type 2 DM, depression, anxiety, recent UTI, morbid obesity, hyponatremia 3. Due to safety, skin/wound care, disease management, medication administration, pain management and patient education, does the patient require 24 hr/day rehab nursing? Yes 4. Does the patient require coordinated care of a physician, rehab nurse, PT, OT, and SLP to address physical and functional deficits in the context of the above medical diagnosis(es)? Yes Addressing deficits in the following areas: balance, endurance, locomotion, strength, transferring, bowel/bladder control, bathing, dressing, feeding, grooming, toileting and psychosocial support 5. Can the patient actively participate in an intensive therapy program of at least 3 hrs of therapy 5 days a week? Yes 6. The potential for patient to make measurable gains while on inpatient rehab is excellent 7. Anticipated functional outcomes upon discharge from  inpatient rehab: modified independent PT, modified independent OT, independent SLP 8. Estimated rehab length of stay to reach the above functional goals is: 10-14 days. 9. Anticipated discharge destination: Home 10. Overall Rehab/Functional Prognosis: excellent   MD Signature: Leeroy Cha, MD

## 2019-03-27 NOTE — TOC Transition Note (Signed)
Transition of Care [facility]) - CM/SW Discharge Note   Patient Details  Name: [name] MRN: [id number] Date of Birth: 1933-02-12  [facility] CM/SW Contact:  Zenon Mayo, RN Phone Number: 03/27/2019, 1:08 PM   Clinical Narrative:    Patient to dc to CIR today.   Final next level of care: IP Rehab Facility Barriers to Discharge: No Barriers Identified   Patient Goals and CMS Choice Patient states their goals for this hospitalization and ongoing recovery are:: Pt wants to feel better CMS Medicare.gov Compare Post Acute Care list provided to:: Patient Choice offered to / list presented to : Patient, Adult Children  Discharge Placement                       Discharge Plan and Services In-house Referral: Clinical Social Work   Post Acute Care Choice: (To be determined)                               Social Determinants of Health (SDOH) Interventions     Readmission Risk Interventions No flowsheet data found.

## 2019-03-27 NOTE — TOC Progression Note (Signed)
Transition of Care [facility]) - Progression Note    Patient Details  Name: Stacy Walls MRN: [id number] Date of Birth: 1933-01-09  [facility] CM/SW Contact  Zenon Mayo, RN Phone Number: 03/27/2019, 10:24 AM  Clinical Narrative:    Per [name] we have insurance approval for appeal for patient to go to CIR today.   Expected Discharge Plan: (CIR vs. HH) Barriers to Discharge: Ship broker, Continued Medical Work up  Expected Discharge Plan and Services Expected Discharge Plan: (CIR vs. HH) In-house Referral: Clinical Social Work   Post Acute Care Choice: (To be determined) Living arrangements for the past 2 months: Single Family Home                                       Social Determinants of Health (SDOH) Interventions    Readmission Risk Interventions No flowsheet data found.

## 2019-03-27 NOTE — Discharge Summary (Signed)
Physician Discharge Summary  Stacy Walls XFG:[id number] DOB: 02-12-1933 DOA: 03/17/2019  PCP: Sonia Side., FNP  Admit date: 03/17/2019 Discharge date: 03/27/2019  Recommendations for Outpatient Follow-up:  1. Discharge to inpatient rehab. 2. Follow up with PCP 7-10 days after discharge from rehab. 3. Check chemistry on 03/30/2019 and report to facility physician. 4. Follow up with nephrology on discharge from rehab.  Discharge Diagnoses: Principal diagnosis is #1 1. Acute on chronic hyponatremia 2. Chronic diastolic heart failure 3. CKD III 4. Prolonged QT interval 5. Anemia due to chronic kidney disease 6. Hypertension 7. DM II 8. Hypothytoidism 9. Hyperlipidemia 10. GERD 11. Breast Cancer, Right - S/P Lumpectomy 12. Debility 13. Morbid obesity  Discharge Condition: Fair  Disposition: Inpatient Rehab  Diet recommendation: Heart healthy/Carbohydrate modified.  Filed Weights   03/25/19 0506 03/26/19 0316 03/27/19 0421  Weight: 108.9 kg 106.1 kg 106.5 kg   History of present illness:  Stacy Walls is a 85 y.o. female with medical history significant for cancer of the right breast, chronic kidney disease stage III, hypertension, type 2 diabetes mellitus, depression, anxiety, and recent UTI, now presenting to the emergency department for evaluation of malaise, nausea, fatigue, and headache.  Patient reports that she was recently started on Levaquin for a UTI and subsequently developed nausea, headache, fatigue, and general malaise.  She notes that some of her other medications were also adjusted recently but is unsure of the specifics.  She does not typically get headaches, has had intermittent headache now for the past 2 days, denies any change in vision or hearing or focal numbness or weakness.  She denies fevers, chills, cough, shortness of breath, or chest pain.  She denies any dysuria or flank pain.  She denies gross hematuria. No seizures.   ED  Course: Upon arrival to the ED, patient is found to be afebrile, saturating mid 90s on room air, and with stable blood pressure.  EKG features a sinus rhythm with PVCs and QTc interval of 507 ms.  Noncontrast head CT is negative for acute intracranial abnormality though there is motion artifact at the vertex.  Chemistry panel features a sodium of 124 and creatinine of 2.16, up from 1.68 earlier this month.  CBC features a chronic normocytic anemia and mild thrombocytopenia.  High-sensitivity troponin is normal.  Urinalysis notable for proteinuria.  Urine was sent for culture and the patient was treated with a liter of normal saline and Phenergan in the ED.  COVID-19 screening test has not yet resulted.  Hospital Course:  Stacy Walls a 85 y.o.femalewith medical history significant forcancer of the right breast, chronic kidney disease stage III, hypertension, type 2 diabetes mellitus, depression, anxiety, and recent UTI, now presenting to the ED for evaluation of malaise, nausea, fatigue, and headache. Patient reports that she was recently started on Levaquin for a UTI and subsequently developed the above symptoms. Pt denies fevers, chills, cough, shortness of breath, or chest pain, dysuria. In the ED, VSS, EKG features a sinus rhythm with PVCs and QTc interval of 507 ms, head CT is negative for acute intracranial abnormality, labs showed sodium of 124 and creatinine of 2.16, up from 1.68 earlier this month. High-sensitivity troponin is normal. Urinalysis notable for proteinuria. Urine was sent for culture. COVID-19 screening test pending.  The patient has been admitted to a telemetry bed. Nerphology has been consulted. Her HCTZ has been held. The patient appears euvolemic. There is no evidence for UTI. Prologned QT interval is resolved. Monitor.  CIR has been recommended for this patient. Insurance had declined authorization for rehab. Peer to peer was done without a change in this  determination. The family wished for an expedited appeal. Today we learned that insurance has reversed it position and the patient is cleared to go to rehab.  Today's assessment: S: The patient is resting comfortably. No new complaints. O: Vitals:  Vitals:   03/27/19 1123 03/27/19 1150  BP: (!) 145/63   Pulse: (!) 57   Resp: 20   Temp: 98.7 F (37.1 C)   SpO2: 97% 98%   Constitutional:  The patient is awake and alert. She is in no acute distress. She wants to go home. Respiratory:   No increased work of breathing.  No wheezes, rales, or rhonchi  No tactile fremitus Cardiovascular:   Regular rate and rhythm  No murmurs, ectopy, or gallups.  No lateral PMI. No thrills. Abdomen:   Abdomen is soft, non-tender, non-distended  No hernias, masses, or organomegaly  Normoactive bowel sounds.  Musculoskeletal:   No cyanosis, clubbing, or edema Skin:   No rashes, lesions, ulcers  palpation of skin: no induration or nodules Neurologic:   Patient is unable to cooperate with exam. Psychiatric:   Mental status: Awake and alert.   Mood and affect are congruent  Discharge Instructions  Discharge Instructions    Activity as tolerated - No restrictions   Complete by: As directed    Call MD for:  persistant nausea and vomiting   Complete by: As directed    Call MD for:  severe uncontrolled pain   Complete by: As directed    Diet - low sodium heart healthy   Complete by: As directed    Diet Carb Modified   Complete by: As directed    Discharge instructions   Complete by: As directed    Discharge to inpatient rehab. Follow up with PCP 7-10 days after discharge from rehab. Check chemistry on 03/30/2019 and report to facility physician. Follow up with nephrology on discharge from rehab.   Increase activity slowly   Complete by: As directed      Allergies as of 03/27/2019      Reactions   Ace Inhibitors Cough   Azithromycin Other (See Comments)   Unknown    Pioglitazone Other (See Comments)   REACTION: edema   Sulfonamide Derivatives Rash   Tramadol Other (See Comments)   dizziness      Medication List    STOP taking these medications   allopurinol 300 MG tablet Commonly known as: ZYLOPRIM   ALPRAZolam 0.25 MG tablet Commonly known as: XANAX   Amitiza 24 MCG capsule Generic drug: lubiprostone   escitalopram 10 MG tablet Commonly known as: LEXAPRO   levofloxacin 750 MG tablet Commonly known as: LEVAQUIN   losartan-hydrochlorothiazide 100-25 MG tablet Commonly known as: HYZAAR   ondansetron 4 MG tablet Commonly known as: ZOFRAN     TAKE these medications   aspirin EC 81 MG tablet Take 81 mg by mouth daily.   atorvastatin 20 MG tablet Commonly known as: LIPITOR Take 1 tablet (20 mg total) by mouth daily.   CALCIUM 500 +D PO Take 1 tablet by mouth daily.   cetirizine 10 MG tablet Commonly known as: ZYRTEC Take 10 mg by mouth daily.   diclofenac sodium 1 % Gel Commonly known as: VOLTAREN APPLY 2 GRAMS EXTERNALLY TO THE AFFECTED AREA FOUR TIMES DAILY What changed: See the new instructions.   feeding supplement (ENSURE ENLIVE) Liqd Take 237  mLs by mouth 2 (two) times daily between meals.   fluticasone 50 MCG/ACT nasal spray Commonly known as: FLONASE Place 2 sprays into both nostrils daily as needed.   furosemide 20 MG tablet Commonly known as: LASIX Take 1 tablet (20 mg total) by mouth daily.   hydrALAZINE 50 MG tablet Commonly known as: APRESOLINE Take 100 mg by mouth 2 (two) times daily.   Iron 325 (65 Fe) MG Tabs Take 325 mg by mouth daily.   levothyroxine 88 MCG tablet Commonly known as: SYNTHROID Take 1 tablet (88 mcg total) by mouth daily.   liothyronine 5 MCG tablet Commonly known as: CYTOMEL Take 1 tablet (5 mcg total) by mouth daily. Start taking on: March 28, 2019   menthol-cetylpyridinium 3 MG lozenge Commonly known as: CEPACOL Take 1 lozenge (3 mg total) by mouth as needed for sore  throat.   metoprolol tartrate 25 MG tablet Commonly known as: LOPRESSOR Take 1 tablet (25 mg total) by mouth 2 (two) times daily.   MULTIVITAMIN WOMEN 50+ PO Take 1 tablet by mouth daily.   omeprazole 20 MG capsule Commonly known as: PRILOSEC TAKE 1 CAPSULE(20 MG) BY MOUTH DAILY What changed: See the new instructions.   polyethylene glycol 17 g packet Commonly known as: MIRALAX / GLYCOLAX Take 17 g by mouth daily.   sitaGLIPtin 100 MG tablet Commonly known as: Januvia TAKE 1/2 TABLET(50 MG) BY MOUTH DAILY   Systane 0.4-0.3 % Soln Generic drug: Polyethyl Glycol-Propyl Glycol Place 1 drop into both eyes daily as needed (for dry eyes).   tamoxifen 20 MG tablet Commonly known as: NOLVADEX TAKE 1 TABLET(20 MG) BY MOUTH DAILY What changed:   how much to take  how to take this  when to take this  additional instructions   triamcinolone ointment 0.1 % Commonly known as: KENALOG APPLY TOPICALLY TO THE AFFECTED AREA THREE TIMES DAILY AS NEEDED FOR ITCHING What changed: See the new instructions.   vitamin C 500 MG tablet Commonly known as: ASCORBIC ACID Take 500 mg by mouth 2 (two) times a week.   VITAMIN D PO Take 1 tablet by mouth daily.      Allergies  Allergen Reactions   Ace Inhibitors Cough   Azithromycin Other (See Comments)    Unknown   Pioglitazone Other (See Comments)    REACTION: edema   Sulfonamide Derivatives Rash   Tramadol Other (See Comments)    dizziness    The results of significant diagnostics from this hospitalization (including imaging, microbiology, ancillary and laboratory) are listed below for reference.    Significant Diagnostic Studies: Ct Abdomen Pelvis Wo Contrast  Result Date: 03/21/2019 CLINICAL DATA:  Unintentional weight loss, abdominal pain, nausea, vomiting EXAM: CT ABDOMEN AND PELVIS WITHOUT CONTRAST TECHNIQUE: Multidetector CT imaging of the abdomen and pelvis was performed following the standard protocol without IV  contrast. COMPARISON:  08/01/2009 FINDINGS: Lower chest: Trace bilateral pleural effusions, right greater than left, with associated compressive atelectasis. Extensive postsurgical and posttreatment changes to the right breast including a 5.5 x 2.2 cm fluid collection adjacent to surgical clips which may represent postoperative seroma or hematoma versus an infected fluid collection (series 3, image 1). Hepatobiliary: No focal liver abnormality is seen. Status post cholecystectomy. No biliary dilatation. Pancreas: Unremarkable. No pancreatic ductal dilatation or surrounding inflammatory changes. Spleen: Normal in size without focal abnormality. Adrenals/Urinary Tract: Adrenal glands are unremarkable. Kidneys are normal, without renal calculi, focal lesion, or hydronephrosis. Bladder is unremarkable. Stomach/Bowel: Stomach is within normal limits. No  pericecal or periappendiceal inflammatory changes. Scattered colonic diverticulosis. No evidence of bowel wall thickening, distention, or inflammatory changes. Vascular/Lymphatic: Aortic atherosclerosis. No enlarged abdominal or pelvic lymph nodes. Reproductive: Uterus and bilateral adnexa are unremarkable. Other: No abdominopelvic ascites. Tiny fat containing umbilical hernia. Musculoskeletal: Degenerative disc disease of the lumbar spine most pronounced at L4-L5. No acute osseous findings. IMPRESSION: 1. No acute abdominopelvic findings. 2. Extensive postsurgical/posttreatment changes to the right breast including a 5.5 x 2.2 cm fluid collection adjacent to surgical clips which may represent postoperative seroma or hematoma. In the appropriate clinical setting, an infected fluid collection is also a consideration. Follow-up with outpatient diagnostic mammogram and possible ultrasound is recommended. 3. Trace bilateral pleural effusions, right greater than left, with associated compressive atelectasis. 4. Aortic atherosclerosis. Aortic Atherosclerosis (ICD10-I70.0).  Electronically Signed   By: Davina Poke M.D.   On: 03/21/2019 11:08   Ct Head Wo Contrast  Result Date: 03/18/2019 CLINICAL DATA:  Altered mental status, loss of consciousness EXAM: CT HEAD WITHOUT CONTRAST TECHNIQUE: Contiguous axial images were obtained from the base of the skull through the vertex without intravenous contrast. COMPARISON:  None. FINDINGS: Brain: Motion artifact is noted near the vertex which may limit detection of subtle abnormalities. No evidence of acute infarction, hemorrhage, hydrocephalus, extra-axial collection or mass lesion/mass effect. Symmetric prominence of the ventricles, cisterns and sulci compatible with parenchymal volume loss. Patchy areas of white matter hypoattenuation are most compatible with chronic microvascular angiopathy. Senescent mineralization of the basal ganglia. Vascular: Atherosclerotic calcification of the carotid siphons and intradural vertebral arteries. No hyperdense vessel. Skull: No calvarial fracture or suspicious osseous lesion. No scalp swelling or hematoma. Sinuses/Orbits: Paranasal sinuses and mastoid air cells are predominantly clear. Included orbital structures are unremarkable. Other: None IMPRESSION: 1. Motion artifact is noted near the vertex which may limit detection of subtle abnormalities. 2. No acute intracranial abnormality. 3. Moderate parenchymal atrophy and chronic microvascular angiopathy. Electronically Signed   By: Lovena Le M.D.   On: 03/18/2019 00:48   Mr Brain Wo Contrast  Result Date: 03/18/2019 CLINICAL DATA:  Unexplained altered level of consciousness EXAM: MRI HEAD WITHOUT CONTRAST TECHNIQUE: Multiplanar, multiecho pulse sequences of the brain and surrounding structures were obtained without intravenous contrast. COMPARISON:  Head CT from earlier today FINDINGS: Brain: No acute infarction, hemorrhage, hydrocephalus, extra-axial collection or mass lesion. Age normal brain volume and age congruent/mild small vessel  ischemic type change in the cerebral white matter Vascular: Normal flow voids Skull and upper cervical spine: Negative for marrow lesion Sinuses/Orbits: Mild mucosal thickening in the left sphenoid sinus. No fluid levels within the sinuses. Negative orbits. Other: Intermittently significantly motion degraded study which could obscure pathology. IMPRESSION: 1. Motion degraded brain MRI without acute finding. 2. Age congruent senescent changes. Electronically Signed   By: Monte Fantasia M.D.   On: 03/18/2019 16:41   Dg Chest Port 1 View  Result Date: 03/24/2019 CLINICAL DATA:  Onset productive cough last night. EXAM: PORTABLE CHEST 1 VIEW COMPARISON:  Single-view of the chest 03/18/2019. CT chest 04/27/2018. FINDINGS: The lungs are clear. Heart size is normal. Aortic atherosclerosis noted. No pneumothorax or pleural effusion. Surgical clips are seen in the right axilla. IMPRESSION: No acute disease. Atherosclerosis. Electronically Signed   By: Inge Rise M.D.   On: 03/24/2019 13:36   Dg Chest Port 1 View  Result Date: 03/18/2019 CLINICAL DATA:  Shortness of breath EXAM: PORTABLE CHEST 1 VIEW COMPARISON:  Chest CT April 27, 2018 FINDINGS: There is no edema or  consolidation. Heart is slightly enlarged with pulmonary vascularity normal. No adenopathy. There is aortic atherosclerosis. There are surgical clips in the right lateral breast/axillary region. IMPRESSION: No edema or consolidation. Stable cardiac prominence. Aortic Atherosclerosis (ICD10-I70.0). Electronically Signed   By: Lowella Grip III M.D.   On: 03/18/2019 08:06    Microbiology: Recent Results (from the past 240 hour(s))  Urine culture     Status: Abnormal   Collection Time: 03/18/19  2:29 AM   Specimen: Urine, Random  Result Value Ref Range Status   Specimen Description URINE, RANDOM  Final   Special Requests NONE  Final   Culture (A)  Final    <10,000 COLONIES/mL INSIGNIFICANT GROWTH Performed at [facility],  [address], [place], [place] [address]    Report Status 03/19/2019 FINAL  Final  SARS CORONAVIRUS 2 (TAT 6-24 HRS) Nasopharyngeal Nasopharyngeal Swab     Status: None   Collection Time: 03/18/19  4:47 AM   Specimen: Nasopharyngeal Swab  Result Value Ref Range Status   SARS Coronavirus 2 NEGATIVE NEGATIVE Final    Comment: (NOTE) SARS-CoV-2 target nucleic acids are NOT DETECTED. The SARS-CoV-2 RNA is generally detectable in upper and lower respiratory specimens during the acute phase of infection. Negative results do not preclude SARS-CoV-2 infection, do not rule out co-infections with other pathogens, and should not be used as the sole basis for treatment or other patient management decisions. Negative results must be combined with clinical observations, patient history, and epidemiological information. The expected result is Negative. Fact Sheet for Patients: SugarRoll.be Fact Sheet for Healthcare Providers: https://www.woods-mathews.com/ This test is not yet approved or cleared by the [place] FDA and  has been authorized for detection and/or diagnosis of SARS-CoV-2 by FDA under an Emergency Use Authorization (EUA). This EUA will remain  in effect (meaning this test can be used) for the duration of the COVID-19 declaration under Section 56 4(b)(1) of the Act, 21 U.S.C. section 360bbb-3(b)(1), unless the authorization is terminated or revoked sooner. Performed at [facility], [place] [address], [place], [place] [address]   C difficile quick scan w PCR reflex     Status: None   Collection Time: 03/24/19 12:42 PM   Specimen: STOOL  Result Value Ref Range Status   C Diff antigen NEGATIVE NEGATIVE Final   C Diff toxin NEGATIVE NEGATIVE Final   C Diff interpretation No C. difficile detected.  Final    Comment: Performed at [facility], [place] [address], [place], [place] [address]     Labs: Basic Metabolic Panel: Recent Labs   Lab 03/20/19 1545  03/22/19 1412 03/23/19 1138 03/24/19 0543 03/25/19 0437 03/27/19 0540  NA 124*   < > 130* 129* 131* 130* 131*  K 4.6   < > 4.6 4.5 4.4 4.2 4.0  CL 89*   < > 97* 96* 98 95* 98  CO2 24   < > 25 24 26 25 24   GLUCOSE 130*   < > 143* 129* 92 93 115*  BUN 22   < > 28* 26* 24* 22 25*  CREATININE 2.00*   < > 1.95* 1.64* 1.63* 1.68* 1.65*  CALCIUM 9.5   < > 9.9 9.8 10.0 9.9 9.7  PHOS 2.4*  --   --   --   --   --   --    < > = values in this interval not displayed.   Liver Function Tests: Recent Labs  Lab 03/20/19 1545  ALBUMIN 2.7*   No results for input(s): LIPASE, AMYLASE in the last 168 hours. No results for input(s): AMMONIA in the last 168 hours. CBC: Recent Labs  Lab 03/23/19 1138 03/24/19 0543 03/25/19 0437 03/26/19 0456 03/27/19 0540  WBC 5.0 3.6* 3.3* 3.5* 3.6*  NEUTROABS 3.1 1.7 1.4* 1.7 1.7  HGB 8.8* 8.2* 8.0* 8.3* 8.6*  HCT 26.5* 25.4* 24.3* 25.0* 25.5*  MCV 93.3 94.1 92.7 94.0 92.7  PLT 125* 122* 124* 123* 126*   Cardiac Enzymes: No results for input(s): CKTOTAL, CKMB, CKMBINDEX, TROPONINI in the last 168 hours. BNP: BNP (last 3 results) Recent Labs    03/20/19 0519  BNP 73.6    ProBNP (last 3 results) No results for input(s): PROBNP in the last 8760 hours.  CBG: Recent Labs  Lab 03/26/19 1121 03/26/19 1721 03/26/19 2117 03/27/19 0557 03/27/19 1124  GLUCAP 105* 146* 114* 114* 129*    Principal Problem:   Hyponatremia Active Problems:   Hypothyroidism   Essential hypertension   CAD, NATIVE VESSEL   Diabetes mellitus with renal manifestation (HCC)   Cancer of central portion of right female breast (HCC)   Acute renal failure superimposed on stage 3 chronic kidney disease ([place])   Time coordinating discharge: 38 minutes.  Signed:        Currie Dennin, DO [facility]  03/27/2019, 12:02 PM

## 2019-03-27 NOTE — Progress Notes (Signed)
Inpatient Rehabilitation Admissions Coordinator  I have insurance approval on expedited appeal to admit to CIR today. I met with patient at bedside and spoke with her son, Mortimer Fries by phone. They are in agreement to admit today. I will notify RN, Dr. Benny Lennert, RN CM and SW. I will make the arrangements to admit today.  Danne Baxter, RN, MSN Rehab Admissions Coordinator [phone] 03/27/2019 10:23 AM

## 2019-03-27 NOTE — H&P (Signed)
Physical Medicine and Rehabilitation Admission H&P    Chief Complaint: Impaired mobility and ADLs  HPI: Stacy Walls is an 85 year old right-handed female with significant history of right breast cancer diagnosed December 2019 status post radiation and adjuvant tamoxifen followed by Dr. Burr Medico, diastolic congestive heart failure, anemia of chronic disease, chronic kidney disease stage III followed by renal services Dr. Justin Mend, hypertension, type 2 diabetes mellitus, depression, anxiety and recent UTI completing a course of Levaquin.  Per chart review patient lives with grandson.  1 level home one-step to entry.  Modified independent using a straight point cane.  Reportedly independent with ADLs.  Presented 03/18/2019 with malaise, headache, nausea, decreased appetite.  Admission labs hemoglobin 10.5, sodium 124, creatinine 2.16 with latest of 1.683 weeks ago, troponin negative, urinalysis negative nitrite, SARS coronavirus negative, TSH 5.807.  Cranial CT scan/MRI negative for acute changes.  Chest x-ray no edema or consolidation.  CT abdomen pelvis no acute abdominopelvic findings.  Extensive postsurgical posttreatment changes to the right breast that was reviewed by oncology services Dr.Feng showing no evidence of cancer recurrence.  Patient's HCTZ was held due to elevated creatinine and hyponatremia with renal services consulted.  Sodium is slowly improving 129-131 and latest creatinine 1.65.  Patient does remain on low-dose Lasix at the recommendations of renal services.  Subcutaneous heparin for DVT prophylaxis.  Latest hemoglobin 8.6 and monitoring for any bleeding episodes.  C. difficile negative.  Patient is tolerating a mechanical soft diet with fluid restriction.  Palliative care consulted to establish goals of care.  Therapy evaluations completed and patient was admitted for a comprehensive rehab program  Review of Systems  Constitutional: Positive for malaise/fatigue.       Decreased  appetite  HENT: Negative for hearing loss.   Eyes: Negative for blurred vision and double vision.  Respiratory: Positive for cough.   Cardiovascular: Positive for palpitations and leg swelling. Negative for chest pain.  Gastrointestinal: Positive for nausea.       GERD  Genitourinary: Negative for dysuria, flank pain and hematuria.  Musculoskeletal: Positive for joint pain, myalgias and neck pain.  Skin: Negative for rash.  Neurological: Positive for dizziness and headaches. Negative for loss of consciousness.  Psychiatric/Behavioral: Positive for depression.       Anxiety  All other systems reviewed and are negative.  Past Medical History:  Diagnosis Date  . Acute cholecystitis   . Allergic rhinitis   . Anemia   . Arthritis   . Cancer [facility])    Right breast  . Chest pain   . Chronic renal insufficiency    followed by Dr Justin Mend stage 3  . Clostridium difficile colitis   . Constipation   . Coronary atherosclerosis of native coronary vessel   . Cough   . Depression    situational - husband died May 20, 2018  . Diabetes mellitus   . Dizziness   . DM2 (diabetes mellitus, type 2) ([place])   . Dyshidrosis   . Family history of adverse reaction to anesthesia    daughter - nausea  . GERD (gastroesophageal reflux disease)   . Gout   . HLD (hyperlipidemia)   . HTN (hypertension)   . Hypothyroidism   . Neck mass   . Neck pain   . Osteoporosis   . Routine general medical examination at a health care facility   . Secondary hyperparathyroidism (of renal origin)   . Urinary incontinence   . Urinary tract infection    Past Surgical History:  Procedure  Laterality Date  . bone density  08/21/05  . BREAST LUMPECTOMY WITH RADIOACTIVE SEED AND AXILLARY LYMPH NODE DISSECTION Right 06/07/2018   Procedure: RIGHT BREAST LUMPECTOMY WITH BRACKETED RADIOACTIVE SEEDS AND RIGHT COMPLETE AXILLARY LYMPH NODE DISSECTION;  Surgeon: Fanny Skates, MD;  Location: [facility];  Service: General;  Laterality: Right;   . C-section (other)  1977  . CHOLECYSTECTOMY    . ELECTROCARDIOGRAM  02/01/06  . L ankle surgery Left 1988  . total left knee Left 2007   Family History  Problem Relation Age of Onset  . Coronary artery disease Father   . Stroke Father   . Diabetes Father   . Diabetes Sister   . Stroke Sister   . Diabetes Brother   . Cancer Brother        liver caner  . Breast cancer Neg Hx    Social History:  reports that she has never smoked. She has never used smokeless tobacco. She reports that she does not drink alcohol or use drugs. Allergies:  Allergies  Allergen Reactions  . Ace Inhibitors Cough  . Azithromycin Other (See Comments)    Unknown  . Pioglitazone Other (See Comments)    REACTION: edema  . Sulfonamide Derivatives Rash  . Tramadol Other (See Comments)    dizziness   Medications Prior to Admission  Medication Sig Dispense Refill  . aspirin EC 81 MG tablet Take 81 mg by mouth daily.    Marland Kitchen atorvastatin (LIPITOR) 20 MG tablet Take 1 tablet (20 mg total) by mouth daily. 30 tablet 0  . Calcium Carb-Cholecalciferol (CALCIUM 500 +D PO) Take 1 tablet by mouth daily.    . cetirizine (ZYRTEC) 10 MG tablet Take 10 mg by mouth daily.     . diclofenac sodium (VOLTAREN) 1 % GEL APPLY 2 GRAMS EXTERNALLY TO THE AFFECTED AREA FOUR TIMES DAILY (Patient taking differently: Apply 2 g topically 4 (four) times daily as needed (painful joints). ) 100 g 0  . feeding supplement, ENSURE ENLIVE, (ENSURE ENLIVE) LIQD Take 237 mLs by mouth 2 (two) times daily between meals. 237 mL 12  . Ferrous Sulfate (IRON) 325 (65 FE) MG TABS Take 325 mg by mouth daily.     . fluticasone (FLONASE) 50 MCG/ACT nasal spray Place 2 sprays into both nostrils daily as needed.     . furosemide (LASIX) 20 MG tablet Take 1 tablet (20 mg total) by mouth daily. 90 tablet 3  . hydrALAZINE (APRESOLINE) 50 MG tablet Take 100 mg by mouth 2 (two) times daily.     Marland Kitchen levothyroxine (SYNTHROID, LEVOTHROID) 88 MCG tablet Take 1 tablet  (88 mcg total) by mouth daily. 90 tablet 3  . [START ON 03/28/2019] liothyronine (CYTOMEL) 5 MCG tablet Take 1 tablet (5 mcg total) by mouth daily. 30 tablet 0  . menthol-cetylpyridinium (CEPACOL) 3 MG lozenge Take 1 lozenge (3 mg total) by mouth as needed for sore throat. 100 tablet 12  . metoprolol tartrate (LOPRESSOR) 25 MG tablet Take 1 tablet (25 mg total) by mouth 2 (two) times daily. 60 tablet 0  . Multiple Vitamins-Minerals (MULTIVITAMIN WOMEN 50+ PO) Take 1 tablet by mouth daily.     Marland Kitchen omeprazole (PRILOSEC) 20 MG capsule TAKE 1 CAPSULE(20 MG) BY MOUTH DAILY (Patient taking differently: Take 20 mg by mouth daily. ) 90 capsule 0  . Polyethyl Glycol-Propyl Glycol (SYSTANE) 0.4-0.3 % SOLN Place 1 drop into both eyes daily as needed (for dry eyes).    . polyethylene glycol (MIRALAX / GLYCOLAX)  17 g packet Take 17 g by mouth daily.    . sitaGLIPtin (JANUVIA) 100 MG tablet TAKE 1/2 TABLET(50 MG) BY MOUTH DAILY (Patient not taking: Reported on 03/18/2019) 15 tablet 0  . tamoxifen (NOLVADEX) 20 MG tablet TAKE 1 TABLET(20 MG) BY MOUTH DAILY (Patient taking differently: Take 20 mg by mouth daily. TAKE 1 TABLET(20 MG) BY MOUTH DAIL) 90 tablet 3  . triamcinolone ointment (KENALOG) 0.1 % APPLY TOPICALLY TO THE AFFECTED AREA THREE TIMES DAILY AS NEEDED FOR ITCHING (Patient taking differently: Apply 1 application topically 3 (three) times daily as needed (for itching). ) 454 g 0  . vitamin C (ASCORBIC ACID) 500 MG tablet Take 500 mg by mouth 2 (two) times a week.     Marland Kitchen VITAMIN D PO Take 1 tablet by mouth daily.      Drug Regimen Review Drug regimen was reviewed and remains appropriate with no significant issues identified    Home: Home Living Family/patient expects to be discharged to:: Private residence Living Arrangements: Other relatives(grandson) Available Help at Discharge: Family, Available PRN/intermittently Type of Home: House Home Access: Stairs to enter Technical brewer of Steps: 1  Home Layout: One level Bathroom Shower/Tub: Other (comment)(sponge bathing only) Bathroom Toilet: Handicapped height Home Equipment: [place] - single point, Bedside commode   Functional History: Prior Function Level of Independence: Needs assistance Gait / Transfers Assistance Needed: modified independent without assistive  ADL's / Homemaking Assistance Needed: independent with ADLs, limited IADLs   Functional Status:  Mobility: Bed Mobility Overal bed mobility: Needs Assistance Bed Mobility: Supine to Sit Rolling: Supervision Supine to sit: Supervision, HOB elevated Sit to supine: HOB elevated, Mod assist, +2 for safety/equipment General bed mobility comments: Pt. able to utilize bed rails to pull with b ues and bring trunk upright.  guided b les towards eob without physical assistance cues to scoot hips and bring feet to the floor. Transfers Overall transfer level: Needs assistance Equipment used: Rolling walker (2 wheeled) Transfers: Sit to/from Stand, Stand Pivot Transfers Sit to Stand: Min guard Stand pivot transfers: Min assist General transfer comment: no physical assistance for sit/stand, cues for rw management and hand placement Ambulation/Gait Ambulation/Gait assistance: Min guard Gait Distance (Feet): 12 Feet(1st trial 8', 2nd trial 12') Assistive device: Rolling walker (2 wheeled) Gait Pattern/deviations: Step-to pattern, Wide base of support General Gait Details: short step to gait with widened BOS Gait velocity: reduced Gait velocity interpretation: <1.31 ft/sec, indicative of household ambulator  ADL: ADL Overall ADL's : Needs assistance/impaired Grooming: Set up, Sitting, Wash/dry face Upper Body Bathing: Set up, Sitting Lower Body Bathing: +2 for physical assistance, Sit to/from stand, Moderate assistance Lower Body Bathing Details (indicate cue type and reason): assist to reach feet and buttocks, min assist +2 stand  Upper Body Dressing : Minimal  assistance, Sitting Lower Body Dressing: Moderate assistance, Sitting/lateral leans Lower Body Dressing Details (indicate cue type and reason): don and adj. socks while seated eob Toilet Transfer: Stand-pivot, BSC, RW, Min guard, Minimal assistance Toilet Transfer Details (indicate cue type and reason): simulated to recliner  Toileting- Clothing Manipulation and Hygiene: Total assistance, Sit to/from stand, Min guard Toileting - Clothing Manipulation Details (indicate cue type and reason): reports prior to admission she had no issues with pericare says she can not remember how she was doing it but knows she did not need assistance, able to clean front peri area without assistance in standing but required total a for buttocks in standing Functional mobility during ADLs: Minimal assistance, Rolling walker  General ADL Comments: reports not sleeping well but agreeable to oob for toileting and seated in recliner for breakfast.  Cognition: Cognition Overall Cognitive Status: Impaired/Different from baseline Orientation Level: Oriented to time, Oriented to person, Oriented to place Cognition Arousal/Alertness: Awake/alert Behavior During Therapy: WFL for tasks assessed/performed Overall Cognitive Status: Impaired/Different from baseline Area of Impairment: Problem solving Orientation Level: Disoriented to, Place, Time, Situation Current Attention Level: Sustained Memory: Decreased short-term memory Following Commands: Follows one step commands inconsistently Safety/Judgement: Decreased awareness of safety, Decreased awareness of deficits Awareness: Intellectual Problem Solving: Slow processing, Decreased initiation, Requires verbal cues General Comments: pt required increased time to problem solve and benefits from one step commands with multimodal cues to sequence steps  Physical Exam: Blood pressure (!) 170/78, pulse 63, temperature 98.4 F (36.9 C), temperature source Oral, resp. rate 17,  height 5\' 2"  (1.575 m), weight 106.5 kg, SpO2 99 %. Vitals reviewed. Gen: no distress, normal appearing HEENT: oral mucosa pink and moist, NCAT Cardio: Reg rate Chest: normal effort, normal rate of breathing Abd: soft, non-distended Ext: no edema Skin: intact Neuro: Patient is alert sitting up in bed.  She makes good eye contact with examiner.  She was able to provide her name as well as place.  Limited medical historian.  She does follow full simple commands.  Musculoskeletal: Strength diffusely 4/5; no focal deficits.  Psych: pleasant, normal affect  Results for orders placed or performed during the hospital encounter of 03/27/19 (from the past 48 hour(s))  Glucose, capillary     Status: Abnormal   Collection Time: 03/27/19  4:39 PM  Result Value Ref Range   Glucose-Capillary 118 (H) 70 - 99 mg/dL   No results found.     Medical Problem List and Plan: 1.  Debility secondary to acute on chronic hyponatremia/AKI with CKD stage III  -patient may shower  -ELOS/Goals: ModI in PT, OT, I in SLP 2.  Antithrombotics: -DVT/anticoagulation: Subcutaneous heparin  -antiplatelet therapy: Aspirin 81 mg daily 3. Pain Management: Voltaren gel 4 times daily 4. Mood: Provide emotional support  -antipsychotic agents: N/A 5. Neuropsych: This patient is capable of making decisions on her own behalf. 6. Skin/Wound Care: Routine skin checks 7. Fluids/Electrolytes/Nutrition: Routine in and outs with follow-up chemistries. Continue Ensure twice per day. 8.  Diastolic congestive heart failure.  Lasix 20 mg daily.  Monitor for any signs of fluid overload 9.  AKI with CKD stage III/hyponatremia.  Followed by renal services.  Continue fluid restriction as well as low-dose Lasix 10.  Hypertension.  Hydralazine 50 mg twice daily 11.  Diabetes mellitus.  Hemoglobin A1c 6.3.  SSI. 12.  History of right breast cancer.  Continue tamoxifen.  Follow-up out patient oncology services 13.  Hypothyroidism.   Continue Synthroid/Cytomel 14.  Anemia of chronic disease.  Continue iron supplement 15.  Hyperlipidemia.  Lipitor 16.  GERD.  Protonix 17. Morbid obesity: Dietary follow-up. 18. Disposition: Upon discharge, she should have follow-up with PCP, physiatry, and nephrology.   Lavon Paganini Angiulli, PA-C 03/27/2019   I have personally performed a face to face diagnostic evaluation, including, but not limited to relevant history and physical exam findings, of this patient and developed relevant assessment and plan.  Additionally, I have reviewed and concur with the physician assistant's documentation above.  The patient's status has not changed. The original post admission physician evaluation remains appropriate, and any changes from the pre-admission screening or documentation from the acute chart are noted above.   Leeroy Cha, MD

## 2019-03-27 NOTE — Plan of Care (Signed)
  Problem: Education: Goal: Knowledge of General Education information will improve Description: Including pain rating scale, medication(s)/side effects and non-pharmacologic comfort measures 03/27/2019 0149 by Rexford Maus, RN Outcome: Progressing 03/27/2019 0144 by Rexford Maus, RN Outcome: Progressing   Problem: Health Behavior/Discharge Planning: Goal: Ability to manage health-related needs will improve 03/27/2019 0149 by Rexford Maus, RN Outcome: Progressing 03/27/2019 0144 by Rexford Maus, RN Outcome: Progressing   Problem: Clinical Measurements: Goal: Ability to maintain clinical measurements within normal limits will improve 03/27/2019 0149 by Rexford Maus, RN Outcome: Progressing 03/27/2019 0144 by Rexford Maus, RN Outcome: Progressing

## 2019-03-27 NOTE — Progress Notes (Signed)
Patient admitted to 75M09. Patient arrived in good spirits with belongings. Patient is A&Ox4 and was oriented to the unit, room, and fall precautions policy.

## 2019-03-28 ENCOUNTER — Telehealth: Payer: Self-pay | Admitting: Endocrinology

## 2019-03-28 ENCOUNTER — Inpatient Hospital Stay (HOSPITAL_COMMUNITY): Payer: Medicare Other | Admitting: Speech Pathology

## 2019-03-28 ENCOUNTER — Inpatient Hospital Stay (HOSPITAL_COMMUNITY): Payer: Medicare Other

## 2019-03-28 ENCOUNTER — Inpatient Hospital Stay (HOSPITAL_COMMUNITY): Payer: Medicare Other | Admitting: Occupational Therapy

## 2019-03-28 ENCOUNTER — Encounter: Payer: Self-pay | Admitting: *Deleted

## 2019-03-28 DIAGNOSIS — R5381 Other malaise: Principal | ICD-10-CM

## 2019-03-28 LAB — COMPREHENSIVE METABOLIC PANEL
ALT: 27 U/L (ref 0–44)
AST: 30 U/L (ref 15–41)
Albumin: 2.6 g/dL — ABNORMAL LOW (ref 3.5–5.0)
Alkaline Phosphatase: 31 U/L — ABNORMAL LOW (ref 38–126)
Anion gap: 9 (ref 5–15)
BUN: 23 mg/dL (ref 8–23)
CO2: 26 mmol/L (ref 22–32)
Calcium: 9.6 mg/dL (ref 8.9–10.3)
Chloride: 98 mmol/L (ref 98–111)
Creatinine, Ser: 1.52 mg/dL — ABNORMAL HIGH (ref 0.44–1.00)
GFR calc Af Amer: 36 mL/min — ABNORMAL LOW (ref >60)
GFR calc non Af Amer: 31 mL/min — ABNORMAL LOW (ref >60)
Glucose, Bld: 110 mg/dL — ABNORMAL HIGH (ref 70–99)
Potassium: 4.1 mmol/L (ref 3.5–5.1)
Sodium: 133 mmol/L — ABNORMAL LOW (ref 135–145)
Total Bilirubin: 0.4 mg/dL (ref 0.3–1.2)
Total Protein: 4.8 g/dL — ABNORMAL LOW (ref 6.5–8.1)

## 2019-03-28 LAB — GLUCOSE, CAPILLARY
Glucose-Capillary: 112 mg/dL — ABNORMAL HIGH (ref 70–99)
Glucose-Capillary: 139 mg/dL — ABNORMAL HIGH (ref 70–99)
Glucose-Capillary: 107 mg/dL — ABNORMAL HIGH (ref 70–99)
Glucose-Capillary: 119 mg/dL — ABNORMAL HIGH (ref 70–99)

## 2019-03-28 MED ORDER — BISACODYL 10 MG RE SUPP: 10.0000 mg | Freq: Every day | RECTAL | Status: DC | PRN

## 2019-03-28 MED ORDER — POLYETHYLENE GLYCOL 3350 17 G PO PACK
17.0000 g | PACK | Freq: Every day | ORAL | Status: DC | PRN
  Administered 2019-03-29: 17 g via ORAL
  Filled 2019-03-28 (×2): qty 1

## 2019-03-28 MED ORDER — SENNOSIDES-DOCUSATE SODIUM 8.6-50 MG PO TABS
2.0000 | ORAL_TABLET | Freq: Every day | ORAL | Status: DC
  Administered 2019-03-28 – 2019-03-30 (×3): 2 via ORAL
  Filled 2019-03-28 (×2): qty 2

## 2019-03-28 MED ORDER — LEVOTHYROXINE SODIUM 112 MCG PO TABS
112.0000 ug | ORAL_TABLET | Freq: Every day | ORAL | Status: DC
  Administered 2019-03-29 – 2019-04-03 (×6): 112 ug via ORAL
  Filled 2019-03-28 (×7): qty 1

## 2019-03-28 MED ORDER — SORBITOL 70 % SOLN
30.0000 mL | Freq: Every day | Status: DC | PRN
  Administered 2019-03-30: 30 mL via ORAL
  Filled 2019-03-28 (×3): qty 30

## 2019-03-28 NOTE — Progress Notes (Signed)
Speech Language Pathology Daily Session Note  Patient Details  Name: Stacy Walls MRN: [id number] Date of Birth: 08/07/1932  Today's Date: 03/28/2019 SLP Individual Time: 4081-4481 SLP Individual Time Calculation (min): 30 min  Short Term Goals: Week 1: SLP Short Term Goal 1 (Week 1): Pt will demonstrate susatined attention to functional tasks with Min A verbal cues for redirection. SLP Short Term Goal 2 (Week 1): Pt will demonstrate functional problem solving during basic and mildly complex tasks with Min A verbal/visual cues. SLP Short Term Goal 3 (Week 1): Pt will demonstrate emergent awareness by identifying errors during functional tasks with Min A verbal/visual cues. SLP Short Term Goal 4 (Week 1): Pt will recall day to day/new information with Min A verbal cues for use of compensatory memory strategies.  Skilled Therapeutic Interventions: Pt was seen for skilled ST targeting cognitive goals. Pt was received from OT; she transferred from her bed to the recliner with 1 verbal cue for safety awareness and sequencing (pull up pants prior to beginning to walk with walker). To address pt's short term memory impairments, SLP introduced a Social worker. Provided Mod A verbal and visual cues, pt used daily therapy schedule to then recall information from each therapy session today and record it in memory notebook. Mod A verbal cues were required for redirection to task, at pt frequently attempted to change topic of conversation. Pt left sitting in recliner with seat alarm placed and engaged, call bell within reach and all needs met. Continue per current plan of care.       Pain Pain Assessment Pain Scale: Faces Faces Pain Scale: Hurts a little bit Pain Type: Acute pain Pain Location: Head Pain Orientation: Mid Pain Descriptors / Indicators: Headache Pain Intervention(s): RN made aware Multiple Pain Sites: No  Therapy/Group: Individual Therapy  Arbutus Leas 03/28/2019, 3:21  PM

## 2019-03-28 NOTE — Evaluation (Signed)
Occupational Therapy Assessment and Plan  Patient Details  Name: Stacy Walls MRN: [id number] Date of Birth: 03-Feb-1933  OT Diagnosis: muscle weakness (generalized) Rehab Potential: Rehab Potential (ACUTE ONLY): Good ELOS: ~5 days   Today's Date: 03/28/2019 OT Individual Time: 1330-1430 OT Individual Time Calculation (min): 60 min     Problem List:  Patient Active Problem List   Diagnosis Date Noted  . Debility 03/27/2019  . Morbid obesity ([place]) 03/27/2019  . Hyponatremia 03/18/2019  . Acute renal failure superimposed on stage 3 chronic kidney disease ([place]) 03/18/2019  . Cancer of overlapping sites of right female breast ([place]) 06/07/2018  . Cancer of central portion of right female breast ([place]) 04/08/2018  . Cough 10/11/2017  . Abnormal endometrial ultrasound 08/03/2017  . Chronic scapular pain 08/02/2017  . Pelvic pain 07/27/2017  . Headache 05/05/2017  . Osteoarthritis 07/29/2016  . UTI (urinary tract infection) 09/04/2015  . Acute sinus infection 07/31/2015  . Screening for breast cancer 01/31/2013  . Diabetes mellitus with renal manifestation ([place]) 08/24/2012  . Iron deficiency anemia, unspecified 01/27/2012  . Screening examination for infectious disease 01/27/2012  . Encounter for long-term (current) use of other medications 01/12/2011  . OTITIS EXTERNA 03/04/2010  . Shoulder pain, left 12/02/2009  . Pancytopenia ([place]) 10/09/2009  . CLOSTRIDIUM DIFFICILE COLITIS 08/23/2009  . CAD, NATIVE VESSEL 08/23/2009  . CKD (chronic kidney disease), stage III ([place]) 08/23/2009  . Acute cholecystitis 08/15/2009  . Dyshidrosis 04/08/2009  . DIZZINESS 07/03/2008  . Secondary renal hyperparathyroidism ([place]) 06/15/2007  . Hypothyroidism 04/20/2007  . NECK PAIN 04/20/2007  . NECK MASS 04/05/2007  . GERD 03/09/2007  . Dyslipidemia 09/20/2006  . GOUT 09/20/2006  . Essential hypertension 09/20/2006  . ALLERGIC RHINITIS 09/20/2006  . Osteoporosis 09/20/2006  . URINARY  INCONTINENCE 09/20/2006    Past Medical History:  Past Medical History:  Diagnosis Date  . Acute cholecystitis   . Allergic rhinitis   . Anemia   . Arthritis   . Cancer [facility])    Right breast  . Chest pain   . Chronic renal insufficiency    followed by Dr Justin Mend stage 3  . Clostridium difficile colitis   . Constipation   . Coronary atherosclerosis of native coronary vessel   . Cough   . Depression    situational - husband died 05-28-18  . Diabetes mellitus   . Dizziness   . DM2 (diabetes mellitus, type 2) ([place])   . Dyshidrosis   . Family history of adverse reaction to anesthesia    daughter - nausea  . GERD (gastroesophageal reflux disease)   . Gout   . HLD (hyperlipidemia)   . HTN (hypertension)   . Hypothyroidism   . Neck mass   . Neck pain   . Osteoporosis   . Routine general medical examination at a health care facility   . Secondary hyperparathyroidism (of renal origin)   . Urinary incontinence   . Urinary tract infection    Past Surgical History:  Past Surgical History:  Procedure Laterality Date  . bone density  08/21/05  . BREAST LUMPECTOMY WITH RADIOACTIVE SEED AND AXILLARY LYMPH NODE DISSECTION Right 06/07/2018   Procedure: RIGHT BREAST LUMPECTOMY WITH BRACKETED RADIOACTIVE SEEDS AND RIGHT COMPLETE AXILLARY LYMPH NODE DISSECTION;  Surgeon: Fanny Skates, MD;  Location: [place];  Service: General;  Laterality: Right;  . C-section (other)  1977  . CHOLECYSTECTOMY    . ELECTROCARDIOGRAM  02/01/06  . L ankle surgery Left 1988  . total left knee  Left 2007    Assessment & Plan Clinical Impression: Patient is a 85 y.o. year old female significant history of right breast cancer diagnosed December 2019 status post radiation and adjuvant tamoxifen followed by Dr. Burr Medico, diastolic congestive heart failure, anemia of chronic disease, chronic kidney disease stage III followed by renal services Dr. Justin Mend, hypertension, type 2 diabetes mellitus, depression, anxiety and recent  UTI completing a course of Levaquin.  Per chart review patient lives with grandson.  1 level home one-step to entry.  Modified independent using a straight point cane.  Reportedly independent with ADLs.  Presented 03/18/2019 with malaise, headache, nausea, decreased appetite.  Admission labs hemoglobin 10.5, sodium 124, creatinine 2.16 with latest of 1.683 weeks ago, troponin negative, urinalysis negative nitrite, SARS coronavirus negative, TSH 5.807.  Cranial CT scan/MRI negative for acute changes.  Chest x-ray no edema or consolidation.  CT abdomen pelvis no acute abdominopelvic findings.  Extensive postsurgical posttreatment changes to the right breast that was reviewed by oncology services Dr.Feng showing no evidence of cancer recurrence.  Patient's HCTZ was held due to elevated creatinine and hyponatremia with renal services consulted.  Sodium is slowly improving 129-131 and latest creatinine 1.65.  Patient does remain on low-dose Lasix at the recommendations of renal services.  Subcutaneous heparin for DVT prophylaxis.  Latest hemoglobin 8.6 and monitoring for any bleeding episodes.  C. difficile negative.  Patient is tolerating a mechanical soft diet with fluid restriction.  Palliative care consulted to establish goals of care.atient transferred to [facility] on 03/27/2019 .    Patient currently requires min guard with basic self-care skills and basic mobility secondary to muscle weakness, decreased cardiorespiratoy endurance and decreased sitting balance and decr endurance.  Prior to hospitalization, patient could complete ADL with modified independent .  Patient will benefit from skilled intervention to decrease level of assist with basic self-care skills and increase independence with basic self-care skills prior to discharge home with care partner.  Anticipate patient will require intermittent supervision and follow up home health.  OT - End of Session Activity Tolerance: Tolerates 10 - 20 min activity  with multiple rests Endurance Deficit: Yes Endurance Deficit Description: Requires rest breaks between activities. OT Assessment Rehab Potential (ACUTE ONLY): Good OT Patient demonstrates impairments in the following area(s): Balance;Cognition;Edema;Endurance;Motor;Pain OT Basic ADL's Functional Problem(s): Bathing;Dressing;Toileting OT Transfers Functional Problem(s): Toilet;Tub/Shower OT Additional Impairment(s): None OT Plan OT Intensity: Minimum of 1-2 x/day, 45 to 90 minutes OT Frequency: 5 out of 7 days OT Duration/Estimated Length of Stay: ~5 days OT Treatment/Interventions: Balance/vestibular training;Self Care/advanced ADL retraining;Therapeutic Activities;Functional mobility training;Patient/family education;Therapeutic Exercise;Community reintegration;DME/adaptive equipment instruction;Psychosocial support;UE/LE Strength taining/ROM OT Self Feeding Anticipated Outcome(s): n/a OT Basic Self-Care Anticipated Outcome(s): mod I OT Toileting Anticipated Outcome(s): mod I OT Bathroom Transfers Anticipated Outcome(s): mod I OT Recommendation Patient destination: Home Follow Up Recommendations: Home health OT Equipment Recommended: To be determined   Skilled Therapeutic Intervention 1:1 Ot eval initiated with OT goals, purpose and role discussed. Pt presents with decr endurance/ activity tolerance related to basic ADLs; however pt able to complete all tasks with extra time. Pt in w/c when arrived. Pt reports she usually performs sponge baths since husband passed eariler this year (Feb) no other details provided but willing to take one today. Pt able to ambulate to the bathroom with close supervision and transfer to the [facility] over the commode with supervision to contact guard with RW. Pt able to perform toileting with encouragement and setup. Pt performs in standing and flexed at the  waist. Pt transferred to the shower bench with supervision with cues for safety and sequencing using the grab  bar. Pt able to bathe sit to stand with min guard. A for washing her hair. Pt don sweatshirt with with setup. Pt ambulated back out to the room and to the EOB with supervision. Pt able to thread underwear on her own but required min A to thread one of her pant legs. Pt able to pull them up on her room. A needed to don her socks. Transferred from EOB to recliner with RW with supervision.  Left pt with next therapist.   OT Evaluation Precautions/Restrictions  Precautions Precautions: Fall Precaution Comments: Bowel incontinence Restrictions Weight Bearing Restrictions: No General Chart Reviewed: Yes Family/Caregiver Present: No Vital Signs Therapy Vitals Temp: 99.7 F (37.6 C) Temp Source: Oral Pulse Rate: (!) 58 Resp: 18 BP: (!) 145/84 Patient Position (if appropriate): Lying Oxygen Therapy SpO2: 99 % O2 Device: Room Air Pain Pain Assessment Pain Scale: Faces Faces Pain Scale: Hurts a little bit Pain Type: Acute pain Pain Location: Head Pain Orientation: Mid Pain Descriptors / Indicators: Headache Pain Intervention(s): RN made aware Multiple Pain Sites: No Home Living/Prior Functioning Home Living Family/patient expects to be discharged to:: Private residence Living Arrangements: Alone Available Help at Discharge: Family, Available PRN/intermittently Type of Home: House Home Access: Stairs to enter Technical brewer of Steps: 1 Entrance Stairs-Rails: None Home Layout: One level Bathroom Shower/Tub: Other (comment)(sponge bathing only) Bathroom Toilet: Handicapped height  Lives With: Family Prior Function Level of Independence: Independent with transfers, Requires assistive device for independence, Independent with gait  Able to Take Stairs?: Yes Driving: No Vocation: Retired ADL ADL Eating: Independent Grooming: Setup Where Assessed-Grooming: Bed level Upper Body Bathing: Setup Where Assessed-Upper Body Bathing: Shower Lower Body Bathing: Minimal  assistance Where Assessed-Lower Body Bathing: Shower Upper Body Dressing: Setup Where Assessed-Upper Body Dressing: Edge of bed Lower Body Dressing: Minimal assistance Where Assessed-Lower Body Dressing: Edge of bed Toileting: Contact guard Where Assessed-Toileting: Glass blower/designer: Therapist, music Method: Product/process development scientist Method: Ambulating Vision Baseline Vision/History: No visual deficits Patient Visual Report: No change from baseline Vision Assessment?: No apparent visual deficits Perception  Perception: Within Functional Limits Praxis Praxis: Intact Cognition Overall Cognitive Status: Impaired/Different from baseline Arousal/Alertness: Awake/alert Orientation Level: Person;Place;Situation Person: Oriented Place: Oriented Situation: Oriented Year: 2020 Month: December Day of Week: Correct Memory: Impaired Memory Impairment: Retrieval deficit;Decreased short term memory Decreased Short Term Memory: Functional basic Immediate Memory Recall: Sock;Blue;Bed Memory Recall Sock: Not able to recall Memory Recall Blue: Not able to recall Memory Recall Bed: Not able to recall Attention: Sustained Sustained Attention: Appears intact Sustained Attention Impairment: Verbal basic;Functional basic Awareness: Impaired Awareness Impairment: Emergent impairment Problem Solving: Appears intact Problem Solving Impairment: Verbal basic;Functional basic Executive Function: Initiating Initiating: Appears intact Safety/Judgment: Appears intact Sensation Sensation Light Touch: Appears Intact Hot/Cold: Appears Intact Proprioception: Appears Intact Stereognosis: Appears Intact Coordination Gross Motor Movements are Fluid and Coordinated: No Fine Motor Movements are Fluid and Coordinated: Yes Coordination and Movement Description: Generalized weakness and decreased activity tolerance Motor  Motor Motor:  Other (comment) Motor - Skilled Clinical Observations: Generalized weakness and decreased activity tolerance Mobility  Bed Mobility Bed Mobility: Rolling Right;Rolling Left;Supine to Sit;Sit to Supine Rolling Right: Minimal Assistance - Patient > 75% Rolling Left: Minimal Assistance - Patient > 75% Supine to Sit: Minimal Assistance - Patient > 75% Sit to Supine: Minimal Assistance - Patient > 75% Transfers  Sit to Stand: Minimal Assistance - Patient > 75%;Contact Guard/Touching assist Stand to Sit: Contact Guard/Touching assist  Trunk/Postural Assessment  Cervical Assessment Cervical Assessment: Exceptions to WFL(forward) Thoracic Assessment Thoracic Assessment: Exceptions to WFL(forward flexed) Lumbar Assessment Lumbar Assessment: Exceptions to WFL(posterior pelvic tilt) Postural Control Postural Control: Deficits on evaluation  Balance Balance Balance Assessed: Yes Static Sitting Balance Static Sitting - Balance Support: Feet supported;No upper extremity supported Static Sitting - Level of Assistance: 5: Stand by assistance Dynamic Sitting Balance Dynamic Sitting - Balance Support: No upper extremity supported;Feet supported;During functional activity Dynamic Sitting - Level of Assistance: 5: Stand by assistance Dynamic Sitting - Balance Activities: Lateral lean/weight shifting;Forward lean/weight shifting;Reaching across midline;Reaching for objects Static Standing Balance Static Standing - Balance Support: No upper extremity supported;During functional activity Static Standing - Level of Assistance: 5: Stand by assistance;4: Min assist Dynamic Standing Balance Dynamic Standing - Balance Support: Right upper extremity supported;Left upper extremity supported;During functional activity Dynamic Standing - Level of Assistance: 4: Min assist Dynamic Standing - Balance Activities: Lateral lean/weight shifting;Forward lean/weight shifting;Reaching for objects Extremity/Trunk  Assessment RUE Assessment RUE Assessment: Within Functional Limits LUE Assessment LUE Assessment: Within Functional Limits     Refer to Care Plan for Long Term Goals  Recommendations for other services: None    Discharge Criteria: Patient will be discharged from OT if patient refuses treatment 3 consecutive times without medical reason, if treatment goals not met, if there is a change in medical status, if patient makes no progress towards goals or if patient is discharged from hospital.  The above assessment, treatment plan, treatment alternatives and goals were discussed and mutually agreed upon: by patient  Nicoletta Ba 03/28/2019, 3:59 PM

## 2019-03-28 NOTE — Patient Care Conference (Signed)
Inpatient RehabilitationTeam Conference and Plan of Care Update Date: 03/28/2019   Time: 11:45 AM   Patient Name: Stacy Walls      Medical Record Number: [id number]  Date of Birth: [birth year] Sex: Female         Room/Bed: 4M09C/4M09C-01 Payor Info: Payor: [employer] MEDICARE / Plan: [facility] MEDICARE / Product Type: *No Product type* /    Admit Date/Time:  03/27/2019  3:40 PM  Primary Diagnosis:  Debility  Patient Active Problem List   Diagnosis Date Noted  . Debility 03/27/2019  . Morbid obesity ([place]) 03/27/2019  . Hyponatremia 03/18/2019  . Acute renal failure superimposed on stage 3 chronic kidney disease ([place]) 03/18/2019  . Cancer of overlapping sites of right female breast ([place]) 06/07/2018  . Cancer of central portion of right female breast ([place]) 04/08/2018  . Cough 10/11/2017  . Abnormal endometrial ultrasound 08/03/2017  . Chronic scapular pain 08/02/2017  . Pelvic pain 07/27/2017  . Headache 05/05/2017  . Osteoarthritis 07/29/2016  . UTI (urinary tract infection) 09/04/2015  . Acute sinus infection 07/31/2015  . Screening for breast cancer 01/31/2013  . Diabetes mellitus with renal manifestation ([place]) 08/24/2012  . Iron deficiency anemia, unspecified 01/27/2012  . Screening examination for infectious disease 01/27/2012  . Encounter for long-term (current) use of other medications 01/12/2011  . OTITIS EXTERNA 03/04/2010  . Shoulder pain, left 12/02/2009  . Pancytopenia ([place]) 10/09/2009  . CLOSTRIDIUM DIFFICILE COLITIS 08/23/2009  . CAD, NATIVE VESSEL 08/23/2009  . CKD (chronic kidney disease), stage III ([place]) 08/23/2009  . Acute cholecystitis 08/15/2009  . Dyshidrosis 04/08/2009  . DIZZINESS 07/03/2008  . Secondary renal hyperparathyroidism ([place]) 06/15/2007  . Hypothyroidism 04/20/2007  . NECK PAIN 04/20/2007  . NECK MASS 04/05/2007  . GERD 03/09/2007  . Dyslipidemia 09/20/2006  . GOUT 09/20/2006  . Essential hypertension 09/20/2006  . ALLERGIC  RHINITIS 09/20/2006  . Osteoporosis 09/20/2006  . URINARY INCONTINENCE 09/20/2006    Expected Discharge Date: Expected Discharge Date: (Estimated LOS 7-10 days)  Team Members Present: Physician leading conference: Dr. Courtney Heys Social Worker Present: Lennart Pall, LCSW Nurse Present: Rozetta Nunnery, RN Case Manager: Karene Fry, RN PT Present: Apolinar Junes, PT OT Present: Roanna Epley, [place], OT SLP Present: Jettie Booze, CF-SLP PPS Coordinator present : Gunnar Fusi, SLP     Current Status/Progress Goal Weekly Team Focus  Bowel/Bladder   Continent of bowel. LBM 12/5 Purewick in place r/t urinary frequency  timed tolieting remain continent of bowel  assess and monitor q shift   Swallow/Nutrition/ Hydration   Eval pending  Eval pending      ADL's   Eval pending        Mobility   Min A overall  Mod I overall, supervision car transfers and stairs  Activity tolerance, balance, functional mobiltiy, gait and stair training, strengthening, and patient/caregiver education.   Communication   Eval pending  Eval pending      Safety/Cognition/ Behavioral Observations  Eval pending  Eval pending      Pain   no c/o of pain  remain pain free  assess and monitor q shift   Skin   no skin issues  skin to remain intact and free of infection  assess and monitor q shift    Rehab Goals Patient on target to meet rehab goals: Yes *See Care Plan and progress notes for long and short-term goals.     Barriers to Discharge  Current Status/Progress Possible Resolutions Date Resolved   Nursing  PT  Lack of/limited family support;Home environment access/layout  1 STE home, lives with her granson that can provide PRN supervision/assist              OT                  SLP                SW                Discharge Planning/Teaching Needs:  home with son and daughter to provide 24/7 assistanc.e  Teaching needs TBD   Team Discussion: 85 yo with debility, no  BM X 5-6 days, creatinine elevated and monitoring, hyponatremia, feels cold, nausea, R knee pain.  RN - BM X 2 today, headache, had tylenol, BS okay, purrwick in place.  OT eval pending.  PT did well, min A rolling in bed, min to sit, transfers CGA, amb 15' CGA, mod I goals.  Lives with grandson.  SLP min/mod cognition.   Revisions to Treatment Plan: N/A     Medical Summary Current Status: 85 BMs this AM- tylenol helps HA; needs to remove Purewick soon since cannot wear in therapy; Weekly Focus/Goal: OT eval this PM; PT-min assist rolling bed; min assist-CGA- walked 15 ft to bathroom- sit-stand min assist; bedside swallow- cognition- mild to moderate assist  Barriers to Discharge: Decreased family/caregiver support;Home enviroment access/layout;Medical stability;Weight  Barriers to Discharge Comments: thyroid- TSH increased Possible Resolutions to Barriers: fixing constipation-   Continued Need for Acute Rehabilitation Level of Care: The patient requires daily medical management by a physician with specialized training in physical medicine and rehabilitation for the following reasons: Direction of a multidisciplinary physical rehabilitation program to maximize functional independence : Yes Medical management of patient stability for increased activity during participation in an intensive rehabilitation regime.: Yes Analysis of laboratory values and/or radiology reports with any subsequent need for medication adjustment and/or medical intervention. : Yes   I attest that I was present, lead the team conference, and concur with the assessment and plan of the team.   [name] 03/30/2019, 10:41 AM  Team conference was held via web/ teleconference due to [place] - 19

## 2019-03-28 NOTE — Telephone Encounter (Signed)
Attempted to return Dr. Wille Glaser call to inquire if lab results and list of current medications will be faxed. Call immediately went to VM. Will be unable to assist in this process if lab results and list of medications are not received.

## 2019-03-28 NOTE — Progress Notes (Signed)
Inpatient Rehabilitation  Patient information reviewed and entered into eRehab system by Marcell Chavarin M. Victory Strollo, M.A., CCC/SLP, PPS Coordinator.  Information including medical coding, functional ability and quality indicators will be reviewed and updated through discharge.    

## 2019-03-28 NOTE — Evaluation (Signed)
Physical Therapy Assessment and Plan  Patient Details  Name: Stacy Walls MRN: [id number] Date of Birth: [birth year]  PT Diagnosis: Abnormal posture, Abnormality of gait, Cognitive deficits, Difficulty walking, Low back pain, Muscle weakness and Pain in joint Rehab Potential: Excellent ELOS: 5-7 days   Today's Date: 03/28/2019 PT Individual Time: 0805-0910 PT Individual Time Calculation (min): 65 min    Problem List:  Patient Active Problem List   Diagnosis Date Noted  . Debility 03/27/2019  . Morbid obesity ([place]) 03/27/2019  . Hyponatremia 03/18/2019  . Acute renal failure superimposed on stage 3 chronic kidney disease ([place]) 03/18/2019  . Cancer of overlapping sites of right female breast ([place]) 06/07/2018  . Cancer of central portion of right female breast ([place]) 04/08/2018  . Cough 10/11/2017  . Abnormal endometrial ultrasound 08/03/2017  . Chronic scapular pain 08/02/2017  . Pelvic pain 07/27/2017  . Headache 05/05/2017  . Osteoarthritis 07/29/2016  . UTI (urinary tract infection) 09/04/2015  . Acute sinus infection 07/31/2015  . Screening for breast cancer 01/31/2013  . Diabetes mellitus with renal manifestation ([place]) 08/24/2012  . Iron deficiency anemia, unspecified 01/27/2012  . Screening examination for infectious disease 01/27/2012  . Encounter for long-term (current) use of other medications 01/12/2011  . OTITIS EXTERNA 03/04/2010  . Shoulder pain, left 12/02/2009  . Pancytopenia ([place]) 10/09/2009  . CLOSTRIDIUM DIFFICILE COLITIS 08/23/2009  . CAD, NATIVE VESSEL 08/23/2009  . CKD (chronic kidney disease), stage III ([place]) 08/23/2009  . Acute cholecystitis 08/15/2009  . Dyshidrosis 04/08/2009  . DIZZINESS 07/03/2008  . Secondary renal hyperparathyroidism ([place]) 06/15/2007  . Hypothyroidism 04/20/2007  . NECK PAIN 04/20/2007  . NECK MASS 04/05/2007  . GERD 03/09/2007  . Dyslipidemia 09/20/2006  . GOUT 09/20/2006  . Essential hypertension 09/20/2006  .  ALLERGIC RHINITIS 09/20/2006  . Osteoporosis 09/20/2006  . URINARY INCONTINENCE 09/20/2006    Past Medical History:  Past Medical History:  Diagnosis Date  . Acute cholecystitis   . Allergic rhinitis   . Anemia   . Arthritis   . Cancer [facility])    Right breast  . Chest pain   . Chronic renal insufficiency    followed by Dr Justin Mend stage 3  . Clostridium difficile colitis   . Constipation   . Coronary atherosclerosis of native coronary vessel   . Cough   . Depression    situational - husband died 05-16-2018  . Diabetes mellitus   . Dizziness   . DM2 (diabetes mellitus, type 2) ([place])   . Dyshidrosis   . Family history of adverse reaction to anesthesia    daughter - nausea  . GERD (gastroesophageal reflux disease)   . Gout   . HLD (hyperlipidemia)   . HTN (hypertension)   . Hypothyroidism   . Neck mass   . Neck pain   . Osteoporosis   . Routine general medical examination at a health care facility   . Secondary hyperparathyroidism (of renal origin)   . Urinary incontinence   . Urinary tract infection    Past Surgical History:  Past Surgical History:  Procedure Laterality Date  . bone density  08/21/05  . BREAST LUMPECTOMY WITH RADIOACTIVE SEED AND AXILLARY LYMPH NODE DISSECTION Right 06/07/2018   Procedure: RIGHT BREAST LUMPECTOMY WITH BRACKETED RADIOACTIVE SEEDS AND RIGHT COMPLETE AXILLARY LYMPH NODE DISSECTION;  Surgeon: Fanny Skates, MD;  Location: [facility] [place];  Service: General;  Laterality: Right;  . C-section (other)  1977  . CHOLECYSTECTOMY    . ELECTROCARDIOGRAM  02/01/06  .  L ankle surgery Left 1988  . total left knee Left 2007    Assessment & Plan Clinical Impression: Patient is a 85 y.o. year old right-handed female with significant history of right breast cancer diagnosed December 2019 status post radiation and adjuvant tamoxifen followed by Dr. Burr Medico, diastolic congestive heart failure, anemia of chronic disease, chronic kidney disease stage III followed by renal  services Dr. Justin Mend, hypertension, type 2 diabetes mellitus, depression, anxiety and recent UTI completing a course of Levaquin.  Per chart review patient lives with grandson.  1 level home one-step to entry.  Modified independent using a straight point cane.  Reportedly independent with ADLs.  Presented 03/18/2019 with malaise, headache, nausea, decreased appetite.  Admission labs hemoglobin 10.5, sodium 124, creatinine 2.16 with latest of 1.683 weeks ago, troponin negative, urinalysis negative nitrite, SARS coronavirus negative, TSH 5.807.  Cranial CT scan/MRI negative for acute changes.  Chest x-ray no edema or consolidation.  CT abdomen pelvis no acute abdominopelvic findings.  Extensive postsurgical posttreatment changes to the right breast that was reviewed by oncology services Dr.Feng showing no evidence of cancer recurrence.  Patient's HCTZ was held due to elevated creatinine and hyponatremia with renal services consulted.  Sodium is slowly improving 129-131 and latest creatinine 1.65.  Patient does remain on low-dose Lasix at the recommendations of renal services.  Subcutaneous heparin for DVT prophylaxis.  Latest hemoglobin 8.6 and monitoring for any bleeding episodes.  C. difficile negative.  Patient is tolerating a mechanical soft diet with fluid restriction.  Palliative care consulted to establish goals of care.  Therapy evaluations completed and patient was admitted for a comprehensive rehab programPatient transferred to CIR on 03/27/2019 .   Patient currently requires min with mobility secondary to muscle weakness, decreased cardiorespiratoy endurance and decreased sitting balance, decreased standing balance, decreased postural control and decreased balance strategies.  Prior to hospitalization, patient was modified independent  with mobility and lived with Family(grandson) in a House home.  Home access is 1Stairs to enter.  Patient will benefit from skilled PT intervention to maximize safe  functional mobility, minimize fall risk and decrease caregiver burden for planned discharge home with intermittent assist.  Anticipate patient will benefit from follow up [facility] at discharge.  PT - End of Session Activity Tolerance: Tolerates 30+ min activity with multiple rests Endurance Deficit: Yes Endurance Deficit Description: Requires rest breaks between activities. PT Assessment PT Barriers to Discharge: Lack of/limited family support;Home environment access/layout PT Barriers to Discharge Comments: 1 STE home, lives with her [employer] that can provide PRN supervision/assist PT Patient demonstrates impairments in the following area(s): Balance;Behavior;Endurance;Motor;Nutrition;Pain;Safety;Skin Integrity PT Transfers Functional Problem(s): Bed Mobility;Bed to Chair;Car;Furniture;Floor PT Locomotion Functional Problem(s): Ambulation;Wheelchair Mobility;Stairs PT Plan PT Intensity: Minimum of 1-2 x/day ,45 to 90 minutes PT Frequency: 5 out of 7 days PT Duration Estimated Length of Stay: 7-10 days PT Treatment/Interventions: Ambulation/gait training;Discharge planning;Functional mobility training;Psychosocial support;Therapeutic Activities;Balance/vestibular training;Disease management/prevention;Neuromuscular re-education;Skin care/wound management;Therapeutic Exercise;Wheelchair propulsion/positioning;DME/adaptive equipment instruction;Pain management;Splinting/orthotics;UE/LE Strength taining/ROM;Cognitive remediation/compensation;Community reintegration;Functional electrical stimulation;Patient/family education;Stair training;UE/LE Coordination activities PT Transfers Anticipated Outcome(s): mod I with LRAD PT Locomotion Anticipated Outcome(s): Mod I household distances with LRAD PT Recommendation Follow Up Recommendations: Home health PT Patient destination: Home Equipment Recommended: To be determined Equipment Details: Will likely need RW, will continue to assess needs throughout  stay  Skilled Therapeutic Intervention In addition to the PT evaluation below, the patient performed the following skilled PT interventions: Patient in bed upon PT arrival. Patient alert and agreeable to PT session. Patient denied pain,  however reported that she was on the bed pan attempting to have a BM. Reported that he last BM was 5 days ago. MD rounded during beginning of session and made aware.   Therapeutic Activity: Bed Mobility: Patient performed rolling R/L in a flat bed without bed rails and supine to/from sit with min A. PT removed bed pan, performed peri-care, and donned an incontinence brief with total A while patient performed rolling in the bed.  Transfers: Patient performed sit to/from stand and stand pivot with min A without AD. PT provided a RW and patient performed stand pivot and sit to/from stand with CGA using the RW. Provided verbal cues for hand placement on RW and reaching back to sit. She also performed a toilet transfer to the [facility] over the toilet with CGA using the RW. Provided cues for hand placement using BSC and RW. Required total A for peri-care and LB dressing in standing. Patient was continent of bowel x2 during session, RN made aware.   Gait Training:  Patient ambulated 3 feet using B HHA with min A without a AD. She then ambulated 15 feet using RW with CGA. Ambulated as described below. Provided verbal cues for staying close to RW for safety and increased step height.  Limited evaluation due to patient experiencing bowel urgency with loose stool during session. Will continue to assess functional mobility as patient can tolerate more activity.   Patient in w/c in room at end of session with breaks locked, chair alarm set, and all needs within reach.   Instructed pt in results of PT evaluation as detailed below, PT POC, rehab potential, rehab goals, and discharge recommendations. Additionally discussed CIR's policies regarding fall safety and use of chair alarm  and/or quick release belt. Pt verbalized understanding and in agreement. Will update pt's family members as they become available.    PT Evaluation Precautions/Restrictions Precautions Precautions: Fall Precaution Comments: Bowel incontinence Restrictions Weight Bearing Restrictions: No Pain Pain Assessment Pain Scale: 0-10 Pain Score: 0-No pain Home Living/Prior Functioning Home Living Available Help at Discharge: Family;Available PRN/intermittently Type of Home: House Home Access: Stairs to enter [employer] of Steps: 1 Entrance Stairs-Rails: None Home Layout: One level Bathroom Shower/Tub: Other (comment)(sponge bathing only)  Lives With: Family(grandson) Prior Function Level of Independence: Independent with transfers;Requires assistive device for independence;Independent with gait  Able to Take Stairs?: Yes Driving: No 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 Praxis Praxis: Intact  Cognition Overall Cognitive Status: Impaired/Different from baseline Orientation Level: Oriented to person;Oriented to place;Oriented to situation;Disoriented to time Attention: Sustained Sustained Attention: Impaired Sustained Attention Impairment: Verbal basic;Functional basic Memory: Impaired Memory Impairment: Retrieval deficit;Decreased short term memory Decreased Short Term Memory: Verbal basic;Functional basic Awareness: Impaired Awareness Impairment: Emergent impairment Problem Solving: Appears intact Problem Solving Impairment: Verbal basic;Functional basic Executive Function: Initiating Initiating: Appears intact Safety/Judgment: Appears intact Sensation Sensation Light Touch: Appears Intact Proprioception: Appears Intact Coordination Gross Motor Movements are Fluid and Coordinated: No Fine Motor Movements are Fluid and Coordinated: Yes Coordination and Movement Description: Generalized weakness and decreased  activity tolerance Motor  Motor Motor: Other (comment) Motor - Skilled Clinical Observations: Generalized weakness and decreased activity tolerance  Mobility Bed Mobility Bed Mobility: Rolling Right;Rolling Left;Supine to Sit;Sit to Supine Rolling Right: Minimal Assistance - Patient > 75% Rolling Left: Minimal Assistance - Patient > 75% Supine to Sit: Minimal Assistance - Patient > 75% Sit to Supine: Minimal Assistance - Patient > 75% Transfers Transfers: Sit to Stand;Stand to [employer]  Transfers Sit to Stand: Minimal Assistance - Patient > 75% Stand to Sit: Minimal Assistance - Patient > 75% Stand Pivot Transfers: Minimal Assistance - Patient > 75% Stand Pivot Transfer Details: Verbal cues for technique;Verbal cues for precautions/safety Transfer (Assistive device): None Locomotion  Gait Ambulation: Yes Gait Assistance: Contact Guard/Touching assist Gait Distance (Feet): 3 Feet Assistive device: B HHA Gait Gait: Yes Gait Pattern: Step-through pattern;Decreased stride length;Decreased hip/knee flexion - right;Decreased hip/knee flexion - left;Left flexed knee in stance;Right flexed knee in stance;Right foot flat;Left foot flat;Lateral trunk lean to right;Lateral trunk lean to left;Decreased trunk rotation Stairs / Additional Locomotion Stairs: No Wheelchair Mobility Wheelchair Mobility: No  Trunk/Postural Assessment  Cervical Assessment Cervical Assessment: Exceptions to WFL(forward) Thoracic Assessment Thoracic Assessment: Exceptions to WFL(rounded shoulders) Lumbar Assessment Lumbar Assessment: Exceptions to WFL(anterior pelvic tilt) Postural Control Postural Control: Deficits on evaluation(decreased)  Balance Balance Balance Assessed: Yes Static Sitting Balance Static Sitting - Balance Support: Feet supported;No upper extremity supported Static Sitting - Level of Assistance: 5: Stand by assistance Dynamic Sitting Balance Dynamic Sitting - Balance Support: No  upper extremity supported;Feet supported;During functional activity Dynamic Sitting - Level of Assistance: 5: Stand by assistance Dynamic Sitting - Balance Activities: Lateral lean/weight shifting;Forward lean/weight shifting;Reaching across midline;Reaching for objects Static Standing Balance Static Standing - Balance Support: No upper extremity supported;During functional activity Static Standing - Level of Assistance: 4: Min assist Dynamic Standing Balance Dynamic Standing - Balance Support: Right upper extremity supported;Left upper extremity supported;During functional activity Dynamic Standing - Level of Assistance: 4: Min assist Dynamic Standing - Balance Activities: Lateral lean/weight shifting;Forward lean/weight shifting;Reaching for objects Extremity Assessment      RLE Assessment RLE Assessment: Exceptions to [facility] Active Range of Motion (AROM) Comments: WFL for all functional mobility General Strength Comments: Grossly in sitting 4/5 throughout LLE Assessment LLE Assessment: Exceptions to [facility] Active Range of Motion (AROM) Comments: WFL for all functional mobility General Strength Comments: Grossly in sitting 4/5 throughout    Refer to Care Plan for Long Term Goals  Recommendations for other services: None   Discharge Criteria: Patient will be discharged from PT if patient refuses treatment 3 consecutive times without medical reason, if treatment goals not met, if there is a change in medical status, if patient makes no progress towards goals or if patient is discharged from hospital.  The above assessment, treatment plan, treatment alternatives and goals were discussed and mutually agreed upon: by patient  Chidera Dearcos L Chantale Leugers PT, DPT  03/28/2019, 3:30 PM

## 2019-03-28 NOTE — Evaluation (Signed)
Speech Language Pathology Assessment and Plan  Patient Details  Name: Stacy Walls MRN: [id number] Date of Birth: May 21, 1932  SLP Diagnosis: Cognitive Impairments  Rehab Potential: Good ELOS: 10 days    Today's Date: 03/28/2019 SLP Individual Time: 0930-1030 SLP Individual Time Calculation (min): 60 min   Problem List:  Patient Active Problem List   Diagnosis Date Noted  . Debility 03/27/2019  . Morbid obesity ([place]) 03/27/2019  . Hyponatremia 03/18/2019  . Acute renal failure superimposed on stage 3 chronic kidney disease ([place]) 03/18/2019  . Cancer of overlapping sites of right female breast ([place]) 06/07/2018  . Cancer of central portion of right female breast ([place]) 04/08/2018  . Cough 10/11/2017  . Abnormal endometrial ultrasound 08/03/2017  . Chronic scapular pain 08/02/2017  . Pelvic pain 07/27/2017  . Headache 05/05/2017  . Osteoarthritis 07/29/2016  . UTI (urinary tract infection) 09/04/2015  . Acute sinus infection 07/31/2015  . Screening for breast cancer 01/31/2013  . Diabetes mellitus with renal manifestation ([place]) 08/24/2012  . Iron deficiency anemia, unspecified 01/27/2012  . Screening examination for infectious disease 01/27/2012  . Encounter for long-term (current) use of other medications 01/12/2011  . OTITIS EXTERNA 03/04/2010  . Shoulder pain, left 12/02/2009  . Pancytopenia ([place]) 10/09/2009  . CLOSTRIDIUM DIFFICILE COLITIS 08/23/2009  . CAD, NATIVE VESSEL 08/23/2009  . CKD (chronic kidney disease), stage III ([place]) 08/23/2009  . Acute cholecystitis 08/15/2009  . Dyshidrosis 04/08/2009  . DIZZINESS 07/03/2008  . Secondary renal hyperparathyroidism ([place]) 06/15/2007  . Hypothyroidism 04/20/2007  . NECK PAIN 04/20/2007  . NECK MASS 04/05/2007  . GERD 03/09/2007  . Dyslipidemia 09/20/2006  . GOUT 09/20/2006  . Essential hypertension 09/20/2006  . ALLERGIC RHINITIS 09/20/2006  . Osteoporosis 09/20/2006  . URINARY INCONTINENCE 09/20/2006    Past Medical History:  Past Medical History:  Diagnosis Date  . Acute cholecystitis   . Allergic rhinitis   . Anemia   . Arthritis   . Cancer [facility])    Right breast  . Chest pain   . Chronic renal insufficiency    followed by Dr Justin Walls stage 3  . Clostridium difficile colitis   . Constipation   . Coronary atherosclerosis of native coronary vessel   . Cough   . Depression    situational - husband died May 23, 2018  . Diabetes mellitus   . Dizziness   . DM2 (diabetes mellitus, type 2) ([place])   . Dyshidrosis   . Family history of adverse reaction to anesthesia    daughter - nausea  . GERD (gastroesophageal reflux disease)   . Gout   . HLD (hyperlipidemia)   . HTN (hypertension)   . Hypothyroidism   . Neck mass   . Neck pain   . Osteoporosis   . Routine general medical examination at a health care facility   . Secondary hyperparathyroidism (of renal origin)   . Urinary incontinence   . Urinary tract infection    Past Surgical History:  Past Surgical History:  Procedure Laterality Date  . bone density  08/21/05  . BREAST LUMPECTOMY WITH RADIOACTIVE SEED AND AXILLARY LYMPH NODE DISSECTION Right 06/07/2018   Procedure: RIGHT BREAST LUMPECTOMY WITH BRACKETED RADIOACTIVE SEEDS AND RIGHT COMPLETE AXILLARY LYMPH NODE DISSECTION;  Surgeon: Fanny Skates, MD;  Location: [facility];  Service: General;  Laterality: Right;  . C-section (other)  1977  . CHOLECYSTECTOMY    . ELECTROCARDIOGRAM  02/01/06  . L ankle surgery Left 1988  . total left knee Left 2007  Assessment / Plan / Recommendation Clinical Impression   HPI: Stacy Walls is an 85 year old right-handed female with significant history of right breast cancer diagnosed December 2019 status post radiation and adjuvant tamoxifen followed by Stacy Walls, diastolic congestive heart failure, anemia of chronic disease, chronic kidney disease stage III followed by renal services Stacy Walls, hypertension, type 2 diabetes mellitus,  depression, anxiety and recent UTI completing a course of Levaquin.  Per chart review patient lives with grandson.  1 level home one-step to entry.  Modified independent using a straight point cane.  Reportedly independent with ADLs.  Presented 03/18/2019 with malaise, headache, nausea, decreased appetite.  Admission labs hemoglobin 10.5, sodium 124, creatinine 2.16 with latest of 1.683 weeks ago, troponin negative, urinalysis negative nitrite, SARS coronavirus negative, TSH 5.807.  Cranial CT scan/MRI negative for acute changes.  Chest x-ray no edema or consolidation.  CT abdomen pelvis no acute abdominopelvic findings.  Extensive postsurgical posttreatment changes to the right breast that was reviewed by oncology services [facility] showing no evidence of cancer recurrence.  Patient's HCTZ was held due to elevated creatinine and hyponatremia with renal services consulted.  Sodium is slowly improving 129-131 and latest creatinine 1.65.  Patient does remain on low-dose Lasix at the recommendations of renal services.  Subcutaneous heparin for DVT prophylaxis.  Latest hemoglobin 8.6 and monitoring for any bleeding episodes.  C. difficile negative.  Patient is tolerating a mechanical soft diet with fluid restriction.  Palliative care consulted to establish goals of care.  Pt was admitted to CIR 03/27/19 and SLP evaluations were completed 03/28/19 with results as follows:  Clinical bedside swallow evaluation: Pt presents with swallow function within normal limits across solids and liquids. Although pt is missing some dentition and required extra time for mastication of solids, she reported she eats all textures at baseline. Pt is aware enough to pick and choose preferred and appropriate textures from regular unrestricted diet. Provided set up assist, she consumed puree, Dys 3, and regular texture solids with complete oral clearance. She exhibited 1 immediate cough during Dys 3 solid intake when believed due to her  verbosity/attempt to engage in conversation with SLP while mastication POs as opposed to sensorimotor swallow impairment. Recommend regular texture diet, thin liquids, pills whole in applesauce (pt preference), and please provide set up assist of tray.   Cognitive-linguistic evaluation: Pt presents with mild-moderate cognitive impairments primarily impacting problem solving, sustained attention, emergent awareness, and short term memory. Although her score on attention subtest of Cognistat fell within normal limits, pt was internally distracted during interview and required extra cues to answer questions and maintain topics of conversation.  During visual construction tasks, pt did not appear aware of errors and continually asked clinician, "is that right?", indicative of decreased emergent awareness. Cognistat subtest scores that fell outside of normal limits were as follows: Short term recall: 4 (severe impairment) Abstract reasoning: 4 (mild impairment) Visual construction: 2 (moderate impairment) Match calculations: 2 (mild impairment)   Recommend pt receive skilled ST services to address aforementioned cognitive deficits in order to maximize her safety and functional independence upon her d/c home as well as reduce burden of care.    Skilled Therapeutic Interventions          SLP administered clinical bedside swallow and cognitive-linguistic evaluations (see above for details) and results were reviewed with pt.    SLP Assessment  Patient will need skilled Speech Lanaguage Pathology Services during CIR admission    Recommendations  SLP Diet Recommendations: Age  appropriate regular solids;Thin Liquid Administration via: Cup;Straw Medication Administration: Whole meds with puree Supervision: Patient able to self feed Compensations: Slow rate;Small sips/bites;Minimize environmental distractions Postural Changes and/or Swallow Maneuvers: Seated upright 90 degrees Oral Care Recommendations:  Oral care BID Patient destination: Home Follow up Recommendations: Home Health SLP;24 hour supervision/assistance Equipment Recommended: None recommended by SLP    SLP Frequency 3 to 5 out of 7 days   SLP Duration  SLP Intensity  SLP Treatment/Interventions 10 days  Minumum of 1-2 x/day, 30 to 90 minutes  Cognitive remediation/compensation;Cueing hierarchy;Functional tasks;Patient/family education;Internal/external aids;Environmental controls    Pain Pain Assessment Pain Scale: 0-10 Pain Score: 0-No pain      SLP Evaluation Cognition Overall Cognitive Status: Impaired/Different from baseline Orientation Level: Oriented to person;Oriented to place;Oriented to situation Attention: Sustained Sustained Attention: Impaired Sustained Attention Impairment: Verbal basic;Functional basic Memory: Impaired Memory Impairment: Retrieval deficit;Decreased short term memory Decreased Short Term Memory: Verbal basic;Functional basic Awareness: Impaired Awareness Impairment: Emergent impairment Problem Solving: Impaired Problem Solving Impairment: Verbal basic;Functional basic Executive Function: Initiating Initiating: Appears intact Safety/Judgment: Appears intact  Comprehension Auditory Comprehension Overall Auditory Comprehension: Appears within functional limits for tasks assessed Yes/No Questions: Within Functional Limits Commands: Within Functional Limits Conversation: Simple Visual Recognition/Discrimination Discrimination: Not tested Reading Comprehension Reading Status: Not tested Expression Expression Primary Mode of Expression: Verbal Verbal Expression Overall Verbal Expression: Appears within functional limits for tasks assessed Initiation: No impairment Repetition: No impairment Naming: No impairment Pragmatics: No impairment Non-Verbal Means of Communication: Not applicable Written Expression Written Expression: Not tested Oral Motor Oral Motor/Sensory  Function Overall Oral Motor/Sensory Function: Within functional limits Facial ROM: Within Functional Limits Facial Symmetry: Within Functional Limits Facial Sensation: Within Functional Limits Lingual ROM: Within Functional Limits Lingual Symmetry: Within Functional Limits Motor Speech Overall Motor Speech: Appears within functional limits for tasks assessed Respiration: Within functional limits Phonation: Normal Resonance: Within functional limits Articulation: Within functional limitis Intelligibility: Intelligible Motor Planning: Witnin functional limits Motor Speech Errors: Not applicable    Bedside Swallowing Assessment General Date of Onset: 03/18/19 Previous Swallow Assessment: BSE 11/30 Diet Prior to this Study: Dysphagia 3 (soft);Thin liquids Temperature Spikes Noted: N/A Respiratory Status: Room air History of Recent Intubation: No Oral Cavity - Dentition: Missing dentition Self-Feeding Abilities: Needs set up Vision: Functional for self-feeding Patient Positioning: Upright in chair/Tumbleform Baseline Vocal Quality: Normal Volitional Cough: Strong Volitional Swallow: Able to elicit  Oral Care Assessment Does patient have any of the following "high(er) risk" factors?: None of the above Does patient have any of the following "at risk" factors?: None of the above Patient is LOW RISK: Follow universal precautions (see row information) Ice Chips Ice chips: Not tested Thin Liquid Thin Liquid: Within functional limits Presentation: Cup;Straw;Self Fed Nectar Thick Nectar Thick Liquid: Not tested Honey Thick Honey Thick Liquid: Not tested Puree Puree: Within functional limits Presentation: Spoon;Self Fed Solid Solid: Impaired Presentation: Self Fed Oral Phase Functional Implications: Prolonged oral transit;Impaired mastication Pharyngeal Phase Impairments: Cough - Immediate BSE Assessment Risk for Aspiration Impact on safety and function: No  limitations Other Related Risk Factors: History of GERD  Short Term Goals: Week 1: SLP Short Term Goal 1 (Week 1): Pt will demonstrate susatined attention to functional tasks with Min A verbal cues for redirection. SLP Short Term Goal 2 (Week 1): Pt will demonstrate functional problem solving during basic and mildly complex tasks with Min A verbal/visual cues. SLP Short Term Goal 3 (Week 1): Pt will demonstrate emergent awareness by identifying errors during functional  tasks with Min A verbal/visual cues. SLP Short Term Goal 4 (Week 1): Pt will recall day to day/new information with Min A verbal cues for use of compensatory memory strategies.  Refer to Care Plan for Long Term Goals  Recommendations for other services: None   Discharge Criteria: Patient will be discharged from SLP if patient refuses treatment 3 consecutive times without medical reason, if treatment goals not met, if there is a change in medical status, if patient makes no progress towards goals or if patient is discharged from hospital.  The above assessment, treatment plan, treatment alternatives and goals were discussed and mutually agreed upon: by patient  Arbutus Leas 03/28/2019, 10:55 AM

## 2019-03-28 NOTE — Telephone Encounter (Signed)
Dr Philippa Sicks (cell [phone]) is treating patient in rehab in the hospital and thryoid levels are out of whack and the she wanted to make changes to medication but did not want to do so with involving patients endocrinologist in that process

## 2019-03-28 NOTE — Progress Notes (Addendum)
Stacy Walls PHYSICAL MEDICINE & REHABILITATION PROGRESS NOTE   Subjective/Complaints:  Pt reports hasn't had BM in 5-6 days- trying to go this AM- uncomfortable from pushing- feels like pushing out a baby- also feels "cold as ice"- is always cold. Needs to have BM!  Has been having HA intermittently, but not now- and R knee pain since needs a knee replacement on R (already had on L)- tylenol has been helpful.    ROS- pt denies SOB, CP, HA, (+)Nausea, no vomiting, no diarrhea Objective:   No results found. Recent Labs    03/26/19 0456 03/27/19 0540  WBC 3.5* 3.6*  HGB 8.3* 8.6*  HCT 25.0* 25.5*  PLT 123* 126*   Recent Labs    03/27/19 0540 03/28/19 0542  NA 131* 133*  K 4.0 4.1  CL 98 98  CO2 24 26  GLUCOSE 115* 110*  BUN 25* 23  CREATININE 1.65* 1.52*  CALCIUM 9.7 9.6    Intake/Output Summary (Last 24 hours) at 03/28/2019 1008 Last data filed at 03/27/2019 1959 Gross per 24 hour  Intake 0 ml  Output 500 ml  Net -500 ml     Physical Exam: Vital Signs Blood pressure (!) 150/76, pulse 68, temperature 98.1 F (36.7 C), resp. rate 19, height 5\' 2"  (1.575 m), weight 106.5 kg, SpO2 96 %.   Physical Exam: Vitals, labs and nursing notes reviewed DUK:GURKY in bed; is on bedpan; trying to have BM; also trying to get to bathroom with PT who's in room, - has been pushing to have BM intermittently; NAD HEENT: oral mucosa pink and moist;  Cardio: - RRR Chest: CTA B/L Abd: firm; NT, distended; (+) hyperactive BS Ext: no edema- wooding of skin on LEs seen Skin: intact Neuro: Patient is alert sitting up in bed. At least Ox2  Musculoskeletal: Strength diffusely 4/5; no focal deficits.  Psych: pleasant, normal affect- wanders in topics  Assessment/Plan: 1. Functional deficits secondary to Debility due to AKI with acute on chronic renal impairment  which require 3+ hours per day of interdisciplinary therapy in a comprehensive inpatient rehab setting.  Physiatrist is  providing close team supervision and 24 hour management of active medical problems listed below.  Physiatrist and rehab team continue to assess barriers to discharge/monitor patient progress toward functional and medical goals  Care Tool:  Bathing              Bathing assist       Upper Body Dressing/Undressing Upper body dressing        Upper body assist      Lower Body Dressing/Undressing Lower body dressing            Lower body assist       Toileting Toileting    Toileting assist Assist for toileting: Dependent - Patient 0%([place])     Transfers Chair/bed transfer  Transfers assist           Locomotion Ambulation   Ambulation assist              Walk 10 feet activity   Assist           Walk 50 feet activity   Assist           Walk 150 feet activity   Assist           Walk 10 feet on uneven surface  activity   Assist           Wheelchair     Assist  Wheelchair 50 feet with 2 turns activity    Assist            Wheelchair 150 feet activity     Assist          Blood pressure (!) 150/76, pulse 68, temperature 98.1 F (36.7 C), resp. rate 19, height 5\' 2"  (1.575 m), weight 106.5 kg, SpO2 96 %.  Medical Problem List and Plan: 1.  Debility secondary to acute on chronic hyponatremia/AKI with CKD stage III             -patient may shower             -ELOS/Goals: ModI in PT, OT, I in SLP 2.  Antithrombotics: -DVT/anticoagulation: Subcutaneous heparin             -antiplatelet therapy: Aspirin 81 mg daily 3. Pain Management: Voltaren gel 4 times daily 4. Mood: Provide emotional support             -antipsychotic agents: N/A 5. Neuropsych: This patient is capable of making decisions on her own behalf. 6. Skin/Wound Care: Routine skin checks 7. Fluids/Electrolytes/Nutrition: Routine in and outs with follow-up chemistries. Continue Ensure twice per day. 8.  Diastolic  congestive heart failure.  Lasix 20 mg daily.  Monitor for any signs of fluid overload 9.  AKI with CKD stage III/hyponatremia.  Followed by renal services.  Continue fluid restriction as well as low-dose Lasix 10.  Hypertension.  Hydralazine 50 mg twice daily 11.  Diabetes mellitus.  Hemoglobin A1c 6.3.  SSI. 12.  History of right breast cancer.  Continue tamoxifen.  Follow-up out patient oncology services 13.  Hypothyroidism.  Continue Synthroid/Cytomel  12/8- Feels cold as ice- most recent TSH >11-was 5.5 a few days prior, so could be due to acute issues- then 7.98 1 month prior- L/M for Dr Renato Shin her Endo and hopefully will hear back and titrate her thyroid levels.   -heard back- will increase Synthroid to 112 mcg daily and d/c Cytomel per Dr Loanne Drilling. 14.  Anemia of chronic disease.  Continue iron supplement 15.  Hyperlipidemia.  Lipitor 16.  GERD.  Protonix 17. Morbid obesity: Dietary follow-up. 18. Constipation  12/8- trying to go today- ordered Miralax, Sorbitol, and suppository prn and added Senokot S 2 tabs in AM to help her go- not on bowel meds.  18. Disposition: Upon discharge, she should have follow-up with PCP, physiatry, and nephrology and Endocrinology- Dr Loanne Drilling.     LOS: 1 days A FACE TO FACE EVALUATION WAS PERFORMED  Stacy Walls 03/28/2019, 10:08 AM

## 2019-03-29 ENCOUNTER — Inpatient Hospital Stay (HOSPITAL_COMMUNITY): Payer: Medicare Other

## 2019-03-29 LAB — GLUCOSE, CAPILLARY
Glucose-Capillary: 111 mg/dL — ABNORMAL HIGH (ref 70–99)
Glucose-Capillary: 120 mg/dL — ABNORMAL HIGH (ref 70–99)
Glucose-Capillary: 106 mg/dL — ABNORMAL HIGH (ref 70–99)
Glucose-Capillary: 143 mg/dL — ABNORMAL HIGH (ref 70–99)

## 2019-03-29 MED ORDER — SIMETHICONE 80 MG PO CHEW
80.0000 mg | CHEWABLE_TABLET | Freq: Four times a day (QID) | ORAL | Status: DC | PRN
  Administered 2019-03-29 – 2019-04-02 (×5): 80 mg via ORAL
  Filled 2019-03-29 (×5): qty 1

## 2019-03-29 NOTE — Progress Notes (Signed)
Speech Language Pathology Daily Session Note  Patient Details  Name: Ugochi Henzler MRN: [id number] Date of Birth: 13-Dec-1932  Today's Date: 03/29/2019 SLP Individual Time: 1005-1100 SLP Individual Time Calculation (min): 55 min  Short Term Goals: Week 1: SLP Short Term Goal 1 (Week 1): Pt will demonstrate susatined attention to functional tasks with Min A verbal cues for redirection. SLP Short Term Goal 2 (Week 1): Pt will demonstrate functional problem solving during basic and mildly complex tasks with Min A verbal/visual cues. SLP Short Term Goal 3 (Week 1): Pt will demonstrate emergent awareness by identifying errors during functional tasks with Min A verbal/visual cues. SLP Short Term Goal 4 (Week 1): Pt will recall day to day/new information with Min A verbal cues for use of compensatory memory strategies.  Skilled Therapeutic Interventions: Skilled ST services focused on cognitive skills. Pt demonstrated detailed recall of this mornings Pt session and recall of yesterday's events with min A verbal cues for use of memory notebook. Pt demonstrated reduced sustained attention with noted internal distractions and difficulty with topic maintenance, however question baseline personality verse acute deficit. SLP facilitated semi-complex problem solving skills in ALFA medication management task, pt required max A verbal cues functional use of pill organizer when prescribed multiple times per day. Given further questioning pt was consuming multiple times per day medication inaccurately and would benefit from continued medication management education with personal medication. Pt and SLP recorded today's therapy events in memory notebook. Pt was left in room with call bell within reach and chair alarm set. ST recommends to continue skilled ST services.      Pain Pain Assessment Pain Scale: 0-10 Pain Score: 0-No pain  Therapy/Group: Individual Therapy  Liani Caris  [facility] 03/29/2019, 11:10  AM

## 2019-03-29 NOTE — Progress Notes (Signed)
Occupational Therapy Session Note  Patient Details  Name: Stacy Walls MRN: [id number] Date of Birth: May 12, 1932  Today's Date: 03/29/2019 OT Individual Time: 1300-1410 OT Individual Time Calculation (min): 70 min    Short Term Goals: Week 1:  OT Short Term Goal 1 (Week 1): STG=LTG  Skilled Therapeutic Interventions/Progress Updates:    Pt resting in recliner upon arrival.  OT intervention with focus on discharge planning, functional amb with RW, functional transfers, toileting, standing balance, sit<>stand, activity tolerance, and safety awareness to increase independence with BADLs. Educated pt on home safety.  Sit<>stand and functional amb with RW at supervision level.  Pt completed all toileting tasks with supervision.  When washing hands pt leaned on elbows at sink.  Pt took standing rest breaks X 2, after toileting and after washing hands.  Pt with no unsafe behaviors.  Pt returned to recliner.  All needs within reach and seat alarm activated.  Pt requires more than a reasonable amount of time to complete all tasks.    Therapy Documentation Precautions:  Precautions Precautions: Fall Precaution Comments: Bowel incontinence Restrictions Weight Bearing Restrictions: No Pain: Pain Assessment Pain Score: 0-No pain   Therapy/Group: Individual Therapy  Leroy Libman 03/29/2019, 2:23 PM

## 2019-03-29 NOTE — Progress Notes (Addendum)
Physical Therapy Session Note  Patient Details  Name: Stacy Walls MRN: [id number] Date of Birth: 1933-01-07  Today's Date: 03/29/2019 PT Individual Time: 0800-0915 PT Individual Time Calculation (min): 75 min   Short Term Goals: Week 1:  PT Short Term Goal 1 (Week 1): STG=LTG due to short ELOS.  Skilled Therapeutic Interventions/Progress Updates:     Patient in bed upon PT arrival. Patient alert and agreeable to PT session. Patient denied pain, however reported that she felt a hard spot lateral to her R breast this morning. MD rounded during session and was made aware.   Therapeutic Activity: Bed Mobility: Patient performed supine to sit with supervision and increased time in a flat bed without use of bed rails. Provided verbal cues for pushing to elbows then hands to sit up. Transfers: Patient performed sit to/from stand x2, a toilet transfer to the [facility] over the toilet, and a simulated sedan height car transfer using a RW with supervision for safety. Provided verbal cues for hand placement on RW and reaching back to sit. She required total A for peri-care and max A for LB dressing for time management and energy conservation.   Gait Training:  Patient ambulated 15 feet to/from the bathroom using RW with supervision and 10 feet over unlevel surfaces (ramp and mulch) with CGA for safety. Ambulated with decreased gait speed, decreased step length, increased hip and knee flexion, forward trunk lean, and downward head gaze. Provided verbal cues for looking ahead and increased step height for safety. Patient also stepped down from a curb using a RW with CGA for safety/balance then reported feeling dizzy and needed to sit down and return to the room. Dizziness resolved after sitting 3 min.   Wheelchair Mobility:  Patient was transported in the w/c with total A throughout session for energy conservation and time management.  Patient in recliner in room at end of session with breaks  locked, chair alarm set, and all needs within reach. Educated on energy conservation, advocating for herself with medical care, and safety ideas at home throughout session.    Therapy Documentation Precautions:  Precautions Precautions: Fall Precaution Comments: Bowel incontinence Restrictions Weight Bearing Restrictions: No    Therapy/Group: Individual Therapy  [name] L [name] PT, DPT  03/29/2019, 12:24 PM

## 2019-03-29 NOTE — Progress Notes (Signed)
[facility] PHYSICAL MEDICINE & REHABILITATION PROGRESS NOTE   Subjective/Complaints:  Pt reports had at least 2 BMs yesterday- Had a HA for the last 7 days- is finally getting better- took tylenol today for it- helping.  R side is hurting some- R breast-and it feels like it's getting harder to touch.  Room is warm.    ROS- pt denies SOB, CP, HA, (-)Nausea, no vomiting, no diarrhea Objective:   No results found. Recent Labs    03/27/19 0540  WBC 3.6*  HGB 8.6*  HCT 25.5*  PLT 126*   Recent Labs    03/27/19 0540 03/28/19 0542  NA 131* 133*  K 4.0 4.1  CL 98 98  CO2 24 26  GLUCOSE 115* 110*  BUN 25* 23  CREATININE 1.65* 1.52*  CALCIUM 9.7 9.6    Intake/Output Summary (Last 24 hours) at 03/29/2019 1835 Last data filed at 03/29/2019 1300 Gross per 24 hour  Intake 240 ml  Output 400 ml  Net -160 ml     Physical Exam: Vital Signs Blood pressure (!) 137/56, pulse 61, temperature 98 F (36.7 C), resp. rate 19, height 5\' 2"  (1.575 m), weight 106.5 kg, SpO2 99 %.   Physical Exam: Vitals, labs and nursing notes reviewed [id number] in warmer room- almost too warm for me; pt happy about; PT at bedside again; appropriate; NAD HEENT: oral mucosa pink and moist;  Cardio: - RRR Chest: CTA B/L Abd: soft, NT, ND, (+)BS Ext: no edema- wooding of skin on LEs seen Skin: intact Neuro: Patient is alert sitting up in bed. Ox3  Musculoskeletal: Strength diffusely 4/5; no focal deficits.  Psych: pleasant, normal affect- wanders in topics  Assessment/Plan: 1. Functional deficits secondary to Debility due to AKI with acute on chronic renal impairment  which require 3+ hours per day of interdisciplinary therapy in a comprehensive inpatient rehab setting.  Physiatrist is providing close team supervision and 24 hour management of active medical problems listed below.  Physiatrist and rehab team continue to assess barriers to discharge/monitor patient progress toward functional and  medical goals  Care Tool:  Bathing    Body parts bathed by patient: Right arm, Left arm, Chest, Abdomen, Front perineal area, Buttocks, Right upper leg, Left upper leg, Face   Body parts bathed by helper: Right lower leg, Left lower leg     Bathing assist Assist Level: Set up assist     Upper Body Dressing/Undressing Upper body dressing   What is the patient wearing?: Pull over shirt    Upper body assist Assist Level: Set up assist    Lower Body Dressing/Undressing Lower body dressing      What is the patient wearing?: Underwear/pull up, Pants     Lower body assist Assist for lower body dressing: Minimal Assistance - Patient > 75%     Toileting Toileting    Toileting assist Assist for toileting: Supervision/Verbal cueing     Transfers Chair/bed transfer  Transfers assist     Chair/bed transfer assist level: Supervision/Verbal cueing Chair/bed transfer assistive device: Programmer, multimedia   Ambulation assist      Assist level: Supervision/Verbal cueing Assistive device: Walker-rolling Max distance: 15'   Walk 10 feet activity   Assist  Walk 10 feet activity did not occur: Safety/medical concerns(unsafe without AD)  Assist level: Supervision/Verbal cueing Assistive device: Walker-rolling   Walk 50 feet activity   Assist Walk 50 feet with 2 turns activity did not occur: Safety/medical concerns(unsafe without AD)  Walk 150 feet activity   Assist Walk 150 feet activity did not occur: Safety/medical concerns(unsafe without AD)         Walk 10 feet on uneven surface  activity   Assist Walk 10 feet on uneven surfaces activity did not occur: Safety/medical concerns(unsafe without AD)   Assist level: Contact Guard/Touching assist Assistive device: Teacher, English as a foreign language activity did not occur: Safety/medical concerns(Patient with bowel urgency during evaluation)          Wheelchair 50 feet with 2 turns activity    Assist    Wheelchair 50 feet with 2 turns activity did not occur: Safety/medical concerns(Patient with bowel urgency during evaluation)       Wheelchair 150 feet activity     Assist  Wheelchair 150 feet activity did not occur: Safety/medical concerns(Patient with bowel urgency during evaluation)       Blood pressure (!) 137/56, pulse 61, temperature 98 F (36.7 C), resp. rate 19, height 5\' 2"  (1.575 m), weight 106.5 kg, SpO2 99 %.  Medical Problem List and Plan: 1.  Debility secondary to acute on chronic hyponatremia/AKI with CKD stage III             -patient may shower             -ELOS/Goals: ModI in PT, OT, I in SLP 2.  Antithrombotics: -DVT/anticoagulation: Subcutaneous heparin             -antiplatelet therapy: Aspirin 81 mg daily 3. Pain Management: Voltaren gel 4 times daily 4. Mood: Provide emotional support             -antipsychotic agents: N/A 5. Neuropsych: This patient is capable of making decisions on her own behalf. 6. Skin/Wound Care: Routine skin checks 7. Fluids/Electrolytes/Nutrition: Routine in and outs with follow-up chemistries. Continue Ensure twice per day. 8.  Diastolic congestive heart failure.  Lasix 20 mg daily.  Monitor for any signs of fluid overload 9.  AKI with CKD stage III/hyponatremia.  Followed by renal services.  Continue fluid restriction as well as low-dose Lasix 10.  Hypertension.  Hydralazine 50 mg twice daily 11.  Diabetes mellitus.  Hemoglobin A1c 6.3.  SSI. 12.  History of right breast cancer.  Continue tamoxifen.  Follow-up out patient oncology services  12/9- feeling like R breast is feeling more hard to touch- likely due to radiation- but will call oncologist -was told no new signs of CA. Also needs mammogram that was due while in hospital- needs to do after d/c.  13.  Hypothyroidism.  Continue Synthroid/Cytomel  12/8- Feels cold as ice- most recent TSH >11-was 5.5 a few days  prior, so could be due to acute issues- then 7.98 1 month prior- L/M for Dr Renato Shin her Endo and hopefully will hear back and titrate her thyroid levels.   -heard back- will increase Synthroid to 112 mcg daily and d/c Cytomel per Dr Loanne Drilling. 14.  Anemia of chronic disease.  Continue iron supplement 15.  Hyperlipidemia.  Lipitor 16.  GERD.  Protonix 17. Morbid obesity: Dietary follow-up. 18. Constipation  12/8- trying to go today- ordered Miralax, Sorbitol, and suppository prn and added Senokot S 2 tabs in AM to help her go- not on bowel meds.   12/9- was able to have 2 large BMs 18. Thrombocytopenia  12/8- Plts of 126- is stable currently 19. Disposition: Upon discharge, she should have follow-up with PCP, physiatry, and nephrology and Endocrinology-  Dr Loanne Drilling.     LOS: 2 days A FACE TO FACE EVALUATION WAS PERFORMED  Jacque Garrels 03/29/2019, 6:35 PM

## 2019-03-30 ENCOUNTER — Inpatient Hospital Stay (HOSPITAL_COMMUNITY): Payer: Medicare Other

## 2019-03-30 ENCOUNTER — Inpatient Hospital Stay (HOSPITAL_COMMUNITY): Payer: Medicare Other | Admitting: Speech Pathology

## 2019-03-30 LAB — CBC WITH DIFFERENTIAL/PLATELET
Abs Immature Granulocytes: 0.02 10*3/uL (ref 0.00–0.07)
Basophils Absolute: 0 10*3/uL (ref 0.0–0.1)
Basophils Relative: 1 %
Eosinophils Absolute: 0.3 10*3/uL (ref 0.0–0.5)
Eosinophils Relative: 8 %
HCT: 26.9 % — ABNORMAL LOW (ref 36.0–46.0)
Hemoglobin: 8.9 g/dL — ABNORMAL LOW (ref 12.0–15.0)
Immature Granulocytes: 1 %
Lymphocytes Relative: 32 %
Lymphs Abs: 1.2 10*3/uL (ref 0.7–4.0)
MCH: 31.3 pg (ref 26.0–34.0)
MCHC: 33.1 g/dL (ref 30.0–36.0)
MCV: 94.7 fL (ref 80.0–100.0)
Monocytes Absolute: 0.7 10*3/uL (ref 0.1–1.0)
Monocytes Relative: 19 %
Neutro Abs: 1.5 10*3/uL — ABNORMAL LOW (ref 1.7–7.7)
Neutrophils Relative %: 39 %
Platelets: 126 10*3/uL — ABNORMAL LOW (ref 150–400)
RBC: 2.84 MIL/uL — ABNORMAL LOW (ref 3.87–5.11)
RDW: 13.7 % (ref 11.5–15.5)
WBC: 3.7 10*3/uL — ABNORMAL LOW (ref 4.0–10.5)
nRBC: 0 % (ref 0.0–0.2)

## 2019-03-30 LAB — GLUCOSE, CAPILLARY
Glucose-Capillary: 108 mg/dL — ABNORMAL HIGH (ref 70–99)
Glucose-Capillary: 119 mg/dL — ABNORMAL HIGH (ref 70–99)
Glucose-Capillary: 145 mg/dL — ABNORMAL HIGH (ref 70–99)
Glucose-Capillary: 114 mg/dL — ABNORMAL HIGH (ref 70–99)

## 2019-03-30 LAB — BASIC METABOLIC PANEL
Anion gap: 10 (ref 5–15)
BUN: 27 mg/dL — ABNORMAL HIGH (ref 8–23)
CO2: 24 mmol/L (ref 22–32)
Calcium: 9.7 mg/dL (ref 8.9–10.3)
Chloride: 99 mmol/L (ref 98–111)
Creatinine, Ser: 1.67 mg/dL — ABNORMAL HIGH (ref 0.44–1.00)
GFR calc Af Amer: 32 mL/min — ABNORMAL LOW (ref >60)
GFR calc non Af Amer: 28 mL/min — ABNORMAL LOW (ref >60)
Glucose, Bld: 107 mg/dL — ABNORMAL HIGH (ref 70–99)
Potassium: 4.2 mmol/L (ref 3.5–5.1)
Sodium: 133 mmol/L — ABNORMAL LOW (ref 135–145)

## 2019-03-30 MED ORDER — FUROSEMIDE 40 MG PO TABS
40.0000 mg | ORAL_TABLET | Freq: Once | ORAL | Status: AC
  Administered 2019-03-30: 40 mg via ORAL
  Filled 2019-03-30: qty 1

## 2019-03-30 MED ORDER — SENNOSIDES-DOCUSATE SODIUM 8.6-50 MG PO TABS
2.0000 | ORAL_TABLET | Freq: Two times a day (BID) | ORAL | Status: DC
  Administered 2019-03-31: 2 via ORAL
  Filled 2019-03-30: qty 2

## 2019-03-30 MED ORDER — POLYETHYLENE GLYCOL 3350 17 G PO PACK
17.0000 g | PACK | Freq: Every day | ORAL | Status: DC
  Administered 2019-03-30 – 2019-03-31 (×2): 17 g via ORAL
  Filled 2019-03-30 (×2): qty 1

## 2019-03-30 MED ORDER — FUROSEMIDE 40 MG PO TABS
40.0000 mg | ORAL_TABLET | Freq: Every day | ORAL | Status: DC
  Administered 2019-03-31 – 2019-04-03 (×4): 40 mg via ORAL
  Filled 2019-03-30 (×4): qty 1

## 2019-03-30 NOTE — Progress Notes (Signed)
Stacy Walls   DOB:1933/04/04   BM#:[id number]   UUV#:[id number]  Oncology follow up  Subjective: Pt is currently in [facility] inpt rehab. She was concerned about her right breast swelling and I saw her today. She has mild intermittent right breast pain since her breast surgery, no fever or chills. She complains of constipation.   Objective:  Vitals:   03/30/19 0518 03/30/19 1419  BP: (!) 163/62 (!) 147/70  Pulse: 61 60  Resp: 18 18  Temp: 98.5 F (36.9 C) 97.8 F (36.6 C)  SpO2: 99% 100%    Body mass index is 42.18 kg/m.  Intake/Output Summary (Last 24 hours) at 03/30/2019 2300 Last data filed at 03/30/2019 1825 Gross per 24 hour  Intake 540 ml  Output -  Net 540 ml     Sclerae unicteric  No peripheral adenopathy  Lungs clear -- no rales or rhonchi  Heart regular rate and rhythm  Abdomen benign  Breast exam: s/p right breast lumpectomy, with mild to moderate lymphedema and diffuse skin hyperpigmentation, no palpable mass.  Left breast exam was normal.  No axillary adenopathy.   CBG (last 3)  Recent Labs    03/30/19 1154 03/30/19 1635 03/30/19 2055  GLUCAP 119* 145* 114*     Labs:  Urine Studies No results for input(s): UHGB, CRYS in the last 72 hours.  Invalid input(s): UACOL, UAPR, USPG, UPH, UTP, UGL, UKET, UBIL, UNIT, UROB, [place], UEPI, UWBC, Duwayne Heck [place], [place]  Basic Metabolic Panel: Recent Labs  Lab 03/24/19 0543 03/25/19 0437 03/27/19 0540 03/28/19 0542 03/30/19 0516  NA 131* 130* 131* 133* 133*  K 4.4 4.2 4.0 4.1 4.2  CL 98 95* 98 98 99  CO2 26 25 24 26 24   GLUCOSE 92 93 115* 110* 107*  BUN 24* 22 25* 23 27*  CREATININE 1.63* 1.68* 1.65* 1.52* 1.67*  CALCIUM 10.0 9.9 9.7 9.6 9.7   GFR Estimated Creatinine Clearance: 28.5 mL/min (A) (by C-G formula based on SCr of 1.67 mg/dL (H)). Liver Function Tests: Recent Labs  Lab 03/28/19 0542  AST 30  ALT 27  ALKPHOS 31*  BILITOT 0.4  PROT 4.8*  ALBUMIN 2.6*   No results for  input(s): LIPASE, AMYLASE in the last 168 hours. No results for input(s): AMMONIA in the last 168 hours. Coagulation profile No results for input(s): INR, PROTIME in the last 168 hours.  CBC: Recent Labs  Lab 03/24/19 0543 03/25/19 0437 03/26/19 0456 03/27/19 0540 03/30/19 0516  WBC 3.6* 3.3* 3.5* 3.6* 3.7*  NEUTROABS 1.7 1.4* 1.7 1.7 1.5*  HGB 8.2* 8.0* 8.3* 8.6* 8.9*  HCT 25.4* 24.3* 25.0* 25.5* 26.9*  MCV 94.1 92.7 94.0 92.7 94.7  PLT 122* 124* 123* 126* 126*   Cardiac Enzymes: No results for input(s): CKTOTAL, CKMB, CKMBINDEX, TROPONINI in the last 168 hours. BNP: Invalid input(s): POCBNP CBG: Recent Labs  Lab 03/29/19 2129 03/30/19 0556 03/30/19 1154 03/30/19 1635 03/30/19 2055  GLUCAP 143* 108* 119* 145* 114*   D-Dimer No results for input(s): DDIMER in the last 72 hours. Hgb A1c No results for input(s): HGBA1C in the last 72 hours. Lipid Profile No results for input(s): CHOL, HDL, LDLCALC, TRIG, CHOLHDL, LDLDIRECT in the last 72 hours. Thyroid function studies No results for input(s): TSH, T4TOTAL, T3FREE, THYROIDAB in the last 72 hours.  Invalid input(s): FREET3 Anemia work up No results for input(s): VITAMINB12, FOLATE, FERRITIN, TIBC, IRON, RETICCTPCT in the last 72 hours. Microbiology Recent Results (from the past 240 hour(s))  C difficile quick scan w PCR reflex     Status: None   Collection Time: 03/24/19 12:42 PM   Specimen: STOOL  Result Value Ref Range Status   C Diff antigen NEGATIVE NEGATIVE Final   C Diff toxin NEGATIVE NEGATIVE Final   C Diff interpretation No C. difficile detected.  Final    Comment: Performed at [facility], [place] [address] [place], [place] [address]      Studies:  No results found.  Assessment: 85 y.o. female with PMH of right breast cancer, CKD, hypertension, diabetes, presented with worsening fatigue, nausea, headache, and confusion.  1.  Right beast lymphedema 2.  Metabolic encephalopathy, resolved   3.  Chronic diastolic CHF 4. Recent UTI  5. DM 6. History of right breast cancer diagnosed in 03/2018, s/p surgery,radiation and currently on adjuvant Tamoxifen  7. CKD stage III 8. Anemia of chronic disease  9. Constipation   Plan:  -Her recent CT scan from March 21, 2019 showed a 5.5 cm fluid collection at adjacent to surgical clips in the right breast, likely seroma.  Breast exam showed moderate lymphedema of the right breast, no palpable mass.  No clinical concern for right breast abscess or cellulitis. -I reassured her that her right breast change is related to previous surgery and radiation, no concern for cancer recurrence. -I will order bilateral diagnostic breast mammogram and right breast ultrasound after discharge, she is overdue for annual mammogram. -I encouraged her to continue rehab -I will see her after hospital discharge.   Truitt Merle, MD 03/30/2019

## 2019-03-30 NOTE — Progress Notes (Signed)
Patient complained all day of constipation. States she had a bowel movement yesterday but was only a small amount and she is uncomfortable and full. Patient asked what medications she could have for constipation. Patient said miralax does not work and demanded her sorbitol. I attempted to explain to the patient that it is given for constipation but she had a bowel movement yesterday, yet the patient was insistent that she get the sorbitol. Patient now having loose bowel movement.

## 2019-03-30 NOTE — Progress Notes (Addendum)
Physical Therapy Session Note  Patient Details  Name: Stacy Walls MRN: [id number] Date of Birth: 05-Jan-1933  Today's Date: 03/30/2019 PT Individual Time: 0910-1010 PT Individual Time Calculation (min): 60 min   Short Term Goals: Week 1:  PT Short Term Goal 1 (Week 1): STG=LTG due to short ELOS.  Skilled Therapeutic Interventions/Progress Updates:     Patient sitting EOB upon PT arrival. Patient alert and agreeable to PT session. Patient denied pain, however, she reported feeling nauseous and dizzy this morning, RN aware. Focused session on transfers, gait, and stair training. Provided several seated rest breaks to manage nausea and dizziness throughout session.   Therapeutic Activity: Patient donned B socks with non-skid socks over them for warmth, pants, and a sweater with max-mod A for time management and energy conservation at beginning of session Transfers: Patient performed sit to/from stand x4 and stand pivot x2 using a RW with supervision for cuing and safety. Provided verbal cues for hand placement on RW and leaning forward to stand.  Gait Training:  Patient ambulated 70 feet using RW with supervision and w/c follow for safety due to decreased endurance. Ambulated with decreased gait speed, decreased step length, increased hip and knee flexion, forward trunk lean, and downward head gaze. Provided verbal cues for looking ahead, erect posture, and increased step heiht. She went up/down a 3" curb step using a RW to simulate home entry with supervision for safety following PT demonstration for technique with RW.   Wheelchair Mobility:  Patient was transported in the w/c with total A throughout session for energy conservation and time management.  Patient in recliner in room with her son at end of session with breaks locked, chair alarm set, and all needs within reach. Patient required increased time and rest breaks for all activities.    Therapy  Documentation Precautions:  Precautions Precautions: Fall Precaution Comments: Bowel incontinence Restrictions Weight Bearing Restrictions: No    Therapy/Group: Individual Therapy  Jamicah Anstead L Sherman Lipuma PT, DPT  03/30/2019, 3:48 PM

## 2019-03-30 NOTE — Progress Notes (Signed)
Speech Language Pathology Daily Session Note  Patient Details  Name: Isabellah Sobocinski MRN: [id number] Date of Birth: 19-Oct-1932  Today's Date: 03/30/2019 SLP Individual Time: 6837-2902 SLP Individual Time Calculation (min): 55 min  Short Term Goals: Week 1: SLP Short Term Goal 1 (Week 1): Pt will demonstrate susatined attention to functional tasks with Min A verbal cues for redirection. SLP Short Term Goal 2 (Week 1): Pt will demonstrate functional problem solving during basic and mildly complex tasks with Min A verbal/visual cues. SLP Short Term Goal 3 (Week 1): Pt will demonstrate emergent awareness by identifying errors during functional tasks with Min A verbal/visual cues. SLP Short Term Goal 4 (Week 1): Pt will recall day to day/new information with Min A verbal cues for use of compensatory memory strategies.  Skilled Therapeutic Interventions: Skilled treatment session focused on cognitive goals. SLP facilitated session with a medication management task. Patient was able to independently recall the function of her current medications with 50% accuracy and required Mod verbal cues to self-monitor and correct errors while organizing a BID pill box. Task was not completed but will be continued at next session. Patient required Mod verbal cues for sustained attention to task. Patient requested to use the commode and transferred to the [facility] with extra time. Patient left on commode with NT present. Continue with current plan of care.      Pain    Therapy/Group: Individual Therapy  Hargis Vandyne 03/30/2019, 2:59 PM

## 2019-03-30 NOTE — Progress Notes (Signed)
Occupational Therapy Session Note  Patient Details  Name: Ifeoluwa Bartz MRN: [id number] Date of Birth: [birth year]  Today's Date: 03/30/2019 OT Individual Time: 1300-1400 OT Individual Time Calculation (min): 60 min    Short Term Goals: Week 1:  OT Short Term Goal 1 (Week 1): STG=LTG  Skilled Therapeutic Interventions/Progress Updates:    Pt resting in recliner upon arrival.  Pt states she doesn't feel well today.  Pt states she needs to have a BM but is unable.  Pt also c/o nausea.  OT intervention with focus on BSC tranfsers (pt keeps BSC beside her bed at home), clothing management, sit<>stand, functional amb with RW in room, and discharge planning.  Pt requires max encouragement/redirection to engage in physical tasks but completes all tasks at supervision level.  Pt requires more than a reasonable amount of time to complete all tasks.  Pt remained in recliner with all needs within reach and seat alarm activated.   Therapy Documentation Precautions:  Precautions Precautions: Fall Precaution Comments: Bowel incontinence Restrictions Weight Bearing Restrictions: No Pain:  Pt c/o "slight" headache; RN notified    Therapy/Group: Individual Therapy  Leroy Libman 03/30/2019, 2:23 PM

## 2019-03-30 NOTE — Progress Notes (Signed)
Social Work Assessment and Plan   Patient Details  Name: Stacy Walls MRN: [id number] Date of Birth: 1932/12/27  Today's Date: 03/30/2019  Problem List:  Patient Active Problem List   Diagnosis Date Noted  . Debility 03/27/2019  . Morbid obesity ([place]) 03/27/2019  . Hyponatremia 03/18/2019  . Acute renal failure superimposed on stage 3 chronic kidney disease ([place]) 03/18/2019  . Cancer of overlapping sites of right female breast ([place]) 06/07/2018  . Cancer of central portion of right female breast ([place]) 04/08/2018  . Cough 10/11/2017  . Abnormal endometrial ultrasound 08/03/2017  . Chronic scapular pain 08/02/2017  . Pelvic pain 07/27/2017  . Headache 05/05/2017  . Osteoarthritis 07/29/2016  . UTI (urinary tract infection) 09/04/2015  . Acute sinus infection 07/31/2015  . Screening for breast cancer 01/31/2013  . Diabetes mellitus with renal manifestation ([place]) 08/24/2012  . Iron deficiency anemia, unspecified 01/27/2012  . Screening examination for infectious disease 01/27/2012  . Encounter for long-term (current) use of other medications 01/12/2011  . OTITIS EXTERNA 03/04/2010  . Shoulder pain, left 12/02/2009  . Pancytopenia ([place]) 10/09/2009  . CLOSTRIDIUM DIFFICILE COLITIS 08/23/2009  . CAD, NATIVE VESSEL 08/23/2009  . CKD (chronic kidney disease), stage III ([place]) 08/23/2009  . Acute cholecystitis 08/15/2009  . Dyshidrosis 04/08/2009  . DIZZINESS 07/03/2008  . Secondary renal hyperparathyroidism ([place]) 06/15/2007  . Hypothyroidism 04/20/2007  . NECK PAIN 04/20/2007  . NECK MASS 04/05/2007  . GERD 03/09/2007  . Dyslipidemia 09/20/2006  . GOUT 09/20/2006  . Essential hypertension 09/20/2006  . ALLERGIC RHINITIS 09/20/2006  . Osteoporosis 09/20/2006  . URINARY INCONTINENCE 09/20/2006   Past Medical History:  Past Medical History:  Diagnosis Date  . Acute cholecystitis   . Allergic rhinitis   . Anemia   . Arthritis   . Cancer [facility])    Right breast  .  Chest pain   . Chronic renal insufficiency    followed by Dr Justin Mend stage 3  . Clostridium difficile colitis   . Constipation   . Coronary atherosclerosis of native coronary vessel   . Cough   . Depression    situational - husband died 15-May-2018  . Diabetes mellitus   . Dizziness   . DM2 (diabetes mellitus, type 2) ([place])   . Dyshidrosis   . Family history of adverse reaction to anesthesia    daughter - nausea  . GERD (gastroesophageal reflux disease)   . Gout   . HLD (hyperlipidemia)   . HTN (hypertension)   . Hypothyroidism   . Neck mass   . Neck pain   . Osteoporosis   . Routine general medical examination at a health care facility   . Secondary hyperparathyroidism (of renal origin)   . Urinary incontinence   . Urinary tract infection    Past Surgical History:  Past Surgical History:  Procedure Laterality Date  . bone density  08/21/05  . BREAST LUMPECTOMY WITH RADIOACTIVE SEED AND AXILLARY LYMPH NODE DISSECTION Right 06/07/2018   Procedure: RIGHT BREAST LUMPECTOMY WITH BRACKETED RADIOACTIVE SEEDS AND RIGHT COMPLETE AXILLARY LYMPH NODE DISSECTION;  Surgeon: Fanny Skates, MD;  Location: [facility];  Service: General;  Laterality: Right;  . C-section (other)  1977  . CHOLECYSTECTOMY    . ELECTROCARDIOGRAM  02/01/06  . L ankle surgery Left 1988  . total left knee Left 2007   Social History:  reports that she has never smoked. She has never used smokeless tobacco. She reports that she does not drink alcohol or use drugs.  Family / Support Systems Marital Status: Widow/Widower How Long?: 05/24/2018 Patient Roles: Parent Children: son, Stacy Walls @ [phone] and daughter, Stacy Walls @ [phone] Anticipated Caregiver: son and daughter Ability/Limitations of Caregiver: son and daughter would alternate Caregiver Availability: 24/7 Family Dynamics: Pt describes children as very supportive.  They are committed to assist as needed.  Social History Preferred language:  English Religion: Holiness Cultural Background: NA Read: Yes Write: Yes Employment Status: Retired Public relations account executive Issues: none Guardian/Conservator: none - per MD, pt is capable of making decisions on her own behalf.   Abuse/Neglect Abuse/Neglect Assessment Can Be Completed: Yes Physical Abuse: Denies Verbal Abuse: Denies Sexual Abuse: Denies Exploitation of patient/patient's resources: Denies Self-Neglect: Denies  Emotional Status Pt's affect, behavior and adjustment status: Pt sitting up in chair. Very pleasant and talkative! Talks at length about her family and her spouse who died this year.  She is frustrated with her level of debility and not feeling very good today.  She denies any significant emotional distress, however, may benefit from neuropsychology consult.  Will monitor. Recent Psychosocial Issues: Spouse died 2018/05/24 Psychiatric History: NOne Substance Abuse History: None  Patient / Family Perceptions, Expectations & Goals Pt/Family understanding of illness & functional limitations: Pt and family wtih good, general understanding of her level of debility due to AKI/CKD issues. Premorbid pt/family roles/activities: Pt completely independent PTA Anticipated changes in roles/activities/participation: Per goals of mod ind - little change anticipated. Pt/family expectations/goals: "I need to be stronger.  That's it."  [employer]: None Premorbid Home Care/DME Agencies: None Transportation available at discharge: yes Resource referrals recommended: Neuropsychology  Discharge Planning Living Arrangements: Alone Support Systems: Children Type of Residence: Private residence Insurance Resources: Chartered certified accountant Resources: [place] Referred: No Living Expenses: Own Does the patient have any problems obtaining your medications?: No Home Management: pt was independent with this Patient/Family Preliminary  Plans: Pt to d/c to her home with intermittent assist of son and daughter (more if needed) Social Work Anticipated Follow Up Needs: HH/OP Expected length of stay: ELOS 5 to 7 days  Clinical Impression Very pleasant, talkative woman here for debility following medical decline with kidney fxn.  Good family support.  Pt with mod ind goals and anticipating a short LOS.  Will follow for support and d/c planning.  Ibeth Fahmy 03/30/2019, 1:57 PM

## 2019-03-30 NOTE — Care Management (Signed)
[facility] Individual Statement of Services  Patient Name:  Stacy Walls  Date:  03/30/2019  Welcome to the [facility].  Our goal is to provide you with an individualized program based on your diagnosis and situation, designed to meet your specific needs.  With this comprehensive rehabilitation program, you will be expected to participate in at least 3 hours of rehabilitation therapies Monday-Friday, with modified therapy programming on the weekends.  Your rehabilitation program will include the following services:  Physical Therapy (PT), Occupational Therapy (OT), Speech Therapy (ST), 24 hour per day rehabilitation nursing, Therapeutic Recreaction (TR), Case Management (Social Worker), Rehabilitation Medicine, Nutrition Services and Pharmacy Services  Weekly team conferences will be held on Tuesdays to discuss your progress.  Your Social Worker will talk with you frequently to get your input and to update you on team discussions.  Team conferences with you and your family in attendance may also be held.  Expected length of stay: 5-7 days   Overall anticipated outcome: independent/ some supervision possible  Depending on your progress and recovery, your program may change. Your Social Worker will coordinate services and will keep you informed of any changes. Your Social Worker's name and contact numbers are listed  below.  The following services may also be recommended but are not provided by the [facility] will be made to provide these services after discharge if needed.  Arrangements include referral to agencies that provide these services.  Your insurance has been verified to be:  [facility] Medicare Your primary doctor is:  Tamala Julian  Pertinent information will be shared with your doctor and your insurance  company.  Social Worker:  [place], [place] or [phone]   Information discussed with and copy given to patient by: Lennart Pall, 03/30/2019, 11:50 AM

## 2019-03-30 NOTE — Plan of Care (Signed)
  Problem: Consults Goal: RH GENERAL PATIENT EDUCATION Description: See Patient Education module for education specifics. 03/30/2019 1509 by Rodolph Bong, LPN Outcome: Progressing 03/30/2019 1508 by Rodolph Bong, LPN Outcome: Progressing Goal: Nutrition Consult-if indicated 03/30/2019 1509 by Rodolph Bong, LPN Outcome: Progressing 03/30/2019 1508 by Rodolph Bong, LPN Outcome: Progressing Goal: Diabetes Guidelines if Diabetic/Glucose > 140 Description: If diabetic or lab glucose is > 140 mg/dl - Initiate Diabetes/Hyperglycemia Guidelines & Document Interventions  03/30/2019 1509 by Rodolph Bong, LPN Outcome: Progressing 03/30/2019 1508 by Rodolph Bong, LPN Outcome: Progressing   Problem: RH BOWEL ELIMINATION Goal: RH STG MANAGE BOWEL WITH ASSISTANCE Description: STG Manage Bowel with min Assistance. 03/30/2019 1509 by Rodolph Bong, LPN Outcome: Progressing 03/30/2019 1508 by Rodolph Bong, LPN Outcome: Progressing Goal: RH STG MANAGE BOWEL W/MEDICATION W/ASSISTANCE Description: STG Manage Bowel with Medication with mod I Assistance. 03/30/2019 1509 by Rodolph Bong, LPN Outcome: Progressing 03/30/2019 1508 by Rodolph Bong, LPN Outcome: Progressing   Problem: RH BLADDER ELIMINATION Goal: RH STG MANAGE BLADDER WITH ASSISTANCE Description: STG Manage Bladder With min Assistance 03/30/2019 1509 by Rodolph Bong, LPN Outcome: Progressing 03/30/2019 1508 by Rodolph Bong, LPN Outcome: Progressing   Problem: RH SKIN INTEGRITY Goal: RH STG SKIN FREE OF INFECTION/BREAKDOWN Description: Pt will remain free of skin breakdown with min assist 03/30/2019 1509 by Rodolph Bong, LPN Outcome: Progressing 03/30/2019 1508 by Rodolph Bong, LPN Outcome: Progressing   Problem: RH SAFETY Goal: RH STG ADHERE TO SAFETY PRECAUTIONS W/ASSISTANCE/DEVICE Description: STG Adhere to Safety Precautions With supervision Assistance/Device. 03/30/2019 1509 by Rodolph Bong,  LPN Outcome: Progressing 03/30/2019 1508 by Rodolph Bong, LPN Outcome: Progressing Goal: RH STG DECREASED RISK OF FALL WITH ASSISTANCE Description: STG Decreased Risk of Fall With reminders/cues  Assistance. 03/30/2019 1509 by Rodolph Bong, LPN Outcome: Progressing 03/30/2019 1508 by Rodolph Bong, LPN Outcome: Progressing   Problem: RH KNOWLEDGE DEFICIT GENERAL Goal: RH STG INCREASE KNOWLEDGE OF SELF CARE AFTER HOSPITALIZATION Description: Pt and family will be able to demonstrate safety and fall precautions for home and compliance and understanding of medications using handouts and teaching education.  03/30/2019 1509 by Rodolph Bong, LPN Outcome: Progressing 03/30/2019 1508 by Rodolph Bong, LPN Outcome: Progressing

## 2019-03-30 NOTE — IPOC Note (Signed)
Overall Plan of Care [facility]) Patient Details Name: Stacy Walls MRN: [id number] DOB: 1933-02-22  Admitting Diagnosis: [facility] Problems: Principal Problem:   Debility Active Problems:   Hypothyroidism   Essential hypertension   CKD (chronic kidney disease), stage III ([place])   Diabetes mellitus with renal manifestation ([place])   Morbid obesity ([place])     Functional Problem List: Nursing Bladder, Bowel, Medication Management, Safety, Edema  PT Balance, Behavior, Endurance, Motor, Nutrition, Pain, Safety, Skin Integrity  OT Balance, Cognition, Edema, Endurance, Motor, Pain  SLP Cognition  TR         Basic ADL's: OT Bathing, Dressing, Toileting     Advanced  ADL's: OT       Transfers: PT Bed Mobility, Bed to Chair, Car, Furniture, Floor  OT Toilet, Metallurgist: PT Ambulation, Emergency planning/management officer, Stairs     Additional Impairments: OT None  SLP Social Cognition   Problem Solving, Memory, Attention, Awareness  TR      Anticipated Outcomes Item Anticipated Outcome  Self Feeding n/a  Swallowing      Basic self-care  mod I  Toileting  mod I   Bathroom Transfers mod I  Bowel/Bladder  min assist, cont x 2  Transfers  mod I with LRAD  Locomotion  Mod I household distances with LRAD  Communication     Cognition  Mod I  Pain  < 4  Safety/Judgment  supervision   Therapy Plan: PT Intensity: Minimum of 1-2 x/day ,45 to 90 minutes PT Frequency: 5 out of 7 days PT Duration Estimated Length of Stay: 5-7 days OT Intensity: Minimum of 1-2 x/day, 45 to 90 minutes OT Frequency: 5 out of 7 days OT Duration/Estimated Length of Stay: ~5 days SLP Intensity: Minumum of 1-2 x/day, 30 to 90 minutes SLP Frequency: 3 to 5 out of 7 days SLP Duration/Estimated Length of Stay: 10 days   Due to the current state of emergency, patients may not be receiving their 3-hours of Medicare-mandated therapy.   Team Interventions: Nursing  Interventions Patient/Family Education, Bladder Management, Bowel Management, Disease Management/Prevention, Medication Management, Discharge Planning, Psychosocial Support  PT interventions Ambulation/gait training, Discharge planning, Functional mobility training, Psychosocial support, Therapeutic Activities, Balance/vestibular training, Disease management/prevention, Neuromuscular re-education, Skin care/wound management, Therapeutic Exercise, Wheelchair propulsion/positioning, DME/adaptive equipment instruction, Pain management, Splinting/orthotics, UE/LE Strength taining/ROM, Cognitive remediation/compensation, Community reintegration, Functional electrical stimulation, Patient/family education, IT trainer, UE/LE Coordination activities  OT Interventions Training and development officer, Self Care/advanced ADL retraining, Therapeutic Activities, Functional mobility training, Patient/family education, Therapeutic Exercise, Community reintegration, Engineer, drilling, Psychosocial support, UE/LE Strength taining/ROM  SLP Interventions Cognitive remediation/compensation, English as a second language teacher, Functional tasks, Patient/family education, Internal/external aids, Environmental controls  TR Interventions    SW/CM Interventions Discharge Planning, Psychosocial Support, Patient/Family Education   Barriers to Discharge MD  Medical stability, Home enviroment access/loayout, Lack of/limited family support, Weight, Medication compliance, Behavior and depressed due to husband death this year and constipated due to TSH elevated.  Nursing      PT Lack of/limited family support, Home environment access/layout 1 STE home, lives with her [place] [employer] that can provide PRN supervision/assist  OT      SLP      SW       Team Discharge Planning: Destination: PT-Home ,OT- Home , SLP-Home Projected Follow-up: PT-Home health PT, OT-  Home health OT, SLP-Home Health SLP, 24 hour supervision/assistance Projected  Equipment Needs: PT-To be determined, OT- To be determined, SLP-None recommended by SLP Equipment Details: PT-Will likely  need RW, will continue to assess needs throughout stay, OT-  Patient/family involved in discharge planning: PT- Patient,  OT-Patient, SLP-Patient  MD ELOS: 5-9 days Medical Rehab Prognosis:  Good Assessment:  Pt is an 85 yr old female with recent hx of R breast CA with now hard right breast (Oncology coming to see her), AKI in setting of CKD with diastolic heart failure, DM with good control overall; HTN slightly elevated, hypothyroidism uncontrolled with significant elevation of TSH- increased Synthroid, And thrombocytopenia and anemia/leukopenia, and severe constipation- have also asked renal to help out since Cr and BUN are rising in setting of heart failure.    See Team Conference Notes for weekly updates to the plan of care

## 2019-03-30 NOTE — Progress Notes (Signed)
[facility] PHYSICAL MEDICINE & REHABILITATION PROGRESS NOTE   Subjective/Complaints:  Pt reports R side of breast/lateral aspect still firm/harder to touch. Asking to see Oncologist. Ears stopped up- concerned she has Ear infection "gets one every year". - also c/o needing to have BM- had 1 last night but feels constipated still and nauseated, like full of gas.  Did well in therapy yesterday but a rough night due to grief for husband as well as lack of BMs  ROS- pt denies SOB, CP, HA, (+)Nausea, no vomiting, no diarrhea Objective:   No results found. Recent Labs    03/30/19 0516  WBC 3.7*  HGB 8.9*  HCT 26.9*  PLT 126*   Recent Labs    03/28/19 0542 03/30/19 0516  NA 133* 133*  K 4.1 4.2  CL 98 99  CO2 26 24  GLUCOSE 110* 107*  BUN 23 27*  CREATININE 1.52* 1.67*  CALCIUM 9.6 9.7    Intake/Output Summary (Last 24 hours) at 03/30/2019 1007 Last data filed at 03/30/2019 0749 Gross per 24 hour  Intake 480 ml  Output -  Net 480 ml     Physical Exam: Vital Signs Blood pressure (!) 163/62, pulse 61, temperature 98.5 F (36.9 C), temperature source Oral, resp. rate 18, height 5\' 2"  (1.575 m), weight 104.6 kg, SpO2 99 %.   Physical Exam: Vitals, labs and nursing notes reviewed TGG:YIRSW in warmer room- almost too warm for me; pt happy about;sitting up at EOB, c/o being cold- door open; NAD HEENT:conjugate gaze; wearing cap on hair Cardio: - RRR- almost bradycardic Chest: CTA B/L; R breast lateral aspect is hard to touch- like radiation changes Abd: soft, NT, somewhat distended, (+)BS Ext: no edema- wooding of skin on LEs seen Skin: intact Neuro: Patient is alert sitting up in bed. Ox3  Musculoskeletal: Strength diffusely 4/5; no focal deficits.  Psych: pleasant, normal affect- wanders in topics  Assessment/Plan: 1. Functional deficits secondary to Debility due to AKI with acute on chronic renal impairment  which require 3+ hours per day of interdisciplinary  therapy in a comprehensive inpatient rehab setting.  Physiatrist is providing close team supervision and 24 hour management of active medical problems listed below.  Physiatrist and rehab team continue to assess barriers to discharge/monitor patient progress toward functional and medical goals  Care Tool:  Bathing    Body parts bathed by patient: Right arm, Left arm, Chest, Abdomen, Front perineal area, Buttocks, Right upper leg, Left upper leg, Face   Body parts bathed by helper: Right lower leg, Left lower leg     Bathing assist Assist Level: Set up assist     Upper Body Dressing/Undressing Upper body dressing   What is the patient wearing?: Pull over shirt    Upper body assist Assist Level: Set up assist    Lower Body Dressing/Undressing Lower body dressing      What is the patient wearing?: Underwear/pull up, Pants     Lower body assist Assist for lower body dressing: Moderate Assistance - Patient 50 - 74%     Toileting Toileting    Toileting assist Assist for toileting: Contact Guard/Touching assist     Transfers Chair/bed transfer  Transfers assist     Chair/bed transfer assist level: Supervision/Verbal cueing Chair/bed transfer assistive device: Programmer, multimedia   Ambulation assist      Assist level: Supervision/Verbal cueing Assistive device: Walker-rolling Max distance: 15'   Walk 10 feet activity   Assist  Walk 10 feet  activity did not occur: Safety/medical concerns(unsafe without AD)  Assist level: Supervision/Verbal cueing Assistive device: Walker-rolling   Walk 50 feet activity   Assist Walk 50 feet with 2 turns activity did not occur: Safety/medical concerns(unsafe without AD)         Walk 150 feet activity   Assist Walk 150 feet activity did not occur: Safety/medical concerns(unsafe without AD)         Walk 10 feet on uneven surface  activity   Assist Walk 10 feet on uneven surfaces activity did  not occur: Safety/medical concerns(unsafe without AD)   Assist level: Contact Guard/Touching assist Assistive device: Teacher, English as a foreign language activity did not occur: Safety/medical concerns(Patient with bowel urgency during evaluation)         Wheelchair 50 feet with 2 turns activity    Assist    Wheelchair 50 feet with 2 turns activity did not occur: Safety/medical concerns(Patient with bowel urgency during evaluation)       Wheelchair 150 feet activity     Assist  Wheelchair 150 feet activity did not occur: Safety/medical concerns(Patient with bowel urgency during evaluation)       Blood pressure (!) 163/62, pulse 61, temperature 98.5 F (36.9 C), temperature source Oral, resp. rate 18, height 5\' 2"  (1.575 m), weight 104.6 kg, SpO2 99 %.  Medical Problem List and Plan: 1.  Debility secondary to acute on chronic hyponatremia/AKI with CKD stage III             -patient may shower             -ELOS/Goals: ModI in PT, OT, I in SLP 2.  Antithrombotics: -DVT/anticoagulation: Subcutaneous heparin             -antiplatelet therapy: Aspirin 81 mg daily 3. Pain Management: Voltaren gel 4 times daily 4. Mood: Provide emotional support             -antipsychotic agents: N/A 5. Neuropsych: This patient is capable of making decisions on her own behalf. 6. Skin/Wound Care: Routine skin checks 7. Fluids/Electrolytes/Nutrition: Routine in and outs with follow-up chemistries. Continue Ensure twice per day. 8.  Diastolic congestive heart failure.  Lasix 20 mg daily.  Monitor for any signs of fluid overload Filed Weights   03/27/19 1541 03/30/19 0518  Weight: 106.5 kg 104.6 kg    9.  AKI with CKD stage III/hyponatremia. .  Continue fluid restriction as well as low-dose Lasix-   12/10-Cr was increased to 1.67 from 1.51- is mild increase however is on fluid restriction so can't really give more IVFs/PO- called renal just to touch base. 10.   Hypertension.  Hydralazine 50 mg twice daily 11.  Diabetes mellitus.  Hemoglobin A1c 6.3.  SSI.   CBG (last 3)  Recent Labs    03/29/19 1653 03/29/19 2129 03/30/19 0556  GLUCAP 106* 143* 108*    12/10- well controlled 12.  History of right breast cancer.  Continue tamoxifen.  Follow-up out patient oncology services  12/9- feeling like R breast is feeling more hard to touch- likely due to radiation- but will call oncologist -was told no new signs of CA. Also needs mammogram that was due while in hospital- needs to do after d/c.  13.  Hypothyroidism.  Continue Synthroid/Cytomel  12/8- Feels cold as ice- most recent TSH >11-was 5.5 a few days prior, so could be due to acute issues- then 7.98 1 month prior- L/M for Dr Hilliard Clark  Loanne Drilling her Endo and hopefully will hear back and titrate her thyroid levels.   -heard back- will increase Synthroid to 112 mcg daily and d/c Cytomel per Dr Loanne Drilling. 14.  Anemia of chronic disease.  Continue iron supplement 15.  Hyperlipidemia.  Lipitor 16.  GERD.  Protonix 17. Morbid obesity: Dietary follow-up. 18. Constipation  12/8- trying to go today- ordered Miralax, Sorbitol, and suppository prn and added Senokot S 2 tabs in AM to help her go- not on bowel meds.   12/9- was able to have 2 large BMs  12/10- still feeling constipated- added miralax AND increased senokot to 2 tabs BID- likely due to thyroid issues. 18. Thrombocytopenia  12/8- Plts of 126- is stable currently  12/10- Plts 126k- pretty stable- s/p radiation? 19. Disposition: Upon discharge, she should have follow-up with PCP, physiatry, and nephrology and Endocrinology- Dr Loanne Drilling.     LOS: 3 days A FACE TO FACE EVALUATION WAS PERFORMED  Lamarkus Nebel 03/30/2019, 10:07 AM

## 2019-03-30 NOTE — Progress Notes (Signed)
Pt sleeping on and off during night. + bm. Up with stand pivot assistance. Pt seems depressed at times, almost one year since her spouse passed away, emotional support given. Continues to have headaches, this am having indigestion after taking her meds on empty stomach but refused any crackers or food to help settle.

## 2019-03-30 NOTE — Progress Notes (Signed)
Patient ID: Stacy Walls, female   DOB: 09/04/1932, 85 y.o.   MRN: [id number] [facility] Progress Note   Reconsulted to see Stacy Walls for ongoing care of her chronic kidney disease with volume overload.  Assessment/ Plan:   1.  Chronic kidney disease stage III: Baseline creatinine ranging 1.7-2.0 per outpatient follow-up records from Dr. Justin Mend.  Her chronic kidney disease appears to be primarily CRS associated with her chronic diastolic heart failure.  Her renal function has been stable over the last few days following her transfer over to the inpatient rehabilitation unit but she appears to be getting more volume overloaded; will increase furosemide dose anticipating that this will be a transient up titration to resume her usual outpatient diuretic dose.  With her hyponatremia in mind, we will not restart her back on thiazide.. 2.  Hyponatremia: Suspected to have been volume mediated and appears to be stable at around 133 at this time.  She possibly has a reset osmostat at her age and is further compounded by her history of diastolic heart failure; monitor with diuresis. 3.  Deconditioning following admission for metabolic encephalopathy/hyponatremia: Currently ongoing inpatient rehabilitation. 4.  Hypertension: Blood pressures noted to be marginally elevated on metoprolol, will monitor this with escalation of diuretic dose and if still elevated, make additional adjustment of antihypertensive therapy.  Subjective:   Stacy Walls reports having had a poor night because of anxiety/grief reaction and this morning felt like she had increasing leg swelling along with right breast pain/firmness.  She has had problems with constipation overnight.   Objective:   BP (!) 163/62   Pulse 61   Temp 98.5 F (36.9 C) (Oral)   Resp 18   Ht 5\' 2"  (1.575 m)   Wt 104.6 kg   SpO2 99%   BMI 42.18 kg/m   Intake/Output Summary (Last 24 hours) at 03/30/2019 1253 Last data filed at  03/30/2019 0749 Gross per 24 hour  Intake 480 ml  Output -  Net 480 ml   Weight change:   Physical Exam: Gen: Comfortably sitting up in recliner, eating lunch CVS: Pulse regular rhythm, normal rate, S1 and S2 normal Resp: Clear to auscultation, no rales/rhonchi Abd: Soft, obese, nontender Ext: 1+ bilateral ankle edema with chronic right upper extremity edema.  Imaging: No results found.  Labs: BMET Recent Labs  Lab 03/24/19 0543 03/25/19 0437 03/27/19 0540 03/28/19 0542 03/30/19 0516  NA 131* 130* 131* 133* 133*  K 4.4 4.2 4.0 4.1 4.2  CL 98 95* 98 98 99  CO2 26 25 24 26 24   GLUCOSE 92 93 115* 110* 107*  BUN 24* 22 25* 23 27*  CREATININE 1.63* 1.68* 1.65* 1.52* 1.67*  CALCIUM 10.0 9.9 9.7 9.6 9.7   CBC Recent Labs  Lab 03/25/19 0437 03/26/19 0456 03/27/19 0540 03/30/19 0516  WBC 3.3* 3.5* 3.6* 3.7*  NEUTROABS 1.4* 1.7 1.7 1.5*  HGB 8.0* 8.3* 8.6* 8.9*  HCT 24.3* 25.0* 25.5* 26.9*  MCV 92.7 94.0 92.7 94.7  PLT 124* 123* 126* 126*    Medications:    . aspirin EC  81 mg Oral Daily  . atorvastatin  20 mg Oral Daily  . diclofenac sodium  2 g Topical QID  . feeding supplement (ENSURE ENLIVE)  237 mL Oral BID BM  . ferrous sulfate  325 mg Oral Daily  . fluticasone  2 spray Each Nare Daily  . furosemide  20 mg Oral Daily  . heparin  5,000 Units Subcutaneous Q8H  .  insulin aspart  0-9 Units Subcutaneous TID WC  . levothyroxine  112 mcg Oral Q0600  . magic mouthwash  10 mL Oral TID  . metoprolol tartrate  25 mg Oral BID  . pantoprazole  40 mg Oral Daily  . polyethylene glycol  17 g Oral Daily  . senna-docusate  2 tablet Oral BID  . tamoxifen  20 mg Oral Daily   [name], MD 03/30/2019, 12:53 PM

## 2019-03-30 NOTE — Progress Notes (Signed)
Social Work Patient ID: Stacy Walls, female   DOB: 1932/11/13, 85 y.o.   MRN: [id number]   Have spoken today with pt and her son, Dr. Tasia Catchings.  Both aware that therapies are considering target d/c date of Monday 12/14 if she continues improving as she has.  Today pt with some nausea and generally not feeling very well.  Plan to touch base with tx team, pt and son tomorrow afternoon to confirm d/c date.  Viviano Bir, LCSW

## 2019-03-31 ENCOUNTER — Inpatient Hospital Stay (HOSPITAL_COMMUNITY): Payer: Medicare Other

## 2019-03-31 ENCOUNTER — Inpatient Hospital Stay (HOSPITAL_COMMUNITY): Payer: Medicare Other | Admitting: Speech Pathology

## 2019-03-31 LAB — RENAL FUNCTION PANEL
Albumin: 3 g/dL — ABNORMAL LOW (ref 3.5–5.0)
Anion gap: 10 (ref 5–15)
BUN: 26 mg/dL — ABNORMAL HIGH (ref 8–23)
CO2: 24 mmol/L (ref 22–32)
Calcium: 10 mg/dL (ref 8.9–10.3)
Chloride: 101 mmol/L (ref 98–111)
Creatinine, Ser: 1.72 mg/dL — ABNORMAL HIGH (ref 0.44–1.00)
GFR calc Af Amer: 31 mL/min — ABNORMAL LOW (ref >60)
GFR calc non Af Amer: 27 mL/min — ABNORMAL LOW (ref >60)
Glucose, Bld: 109 mg/dL — ABNORMAL HIGH (ref 70–99)
Phosphorus: 3.8 mg/dL (ref 2.5–4.6)
Potassium: 4.2 mmol/L (ref 3.5–5.1)
Sodium: 135 mmol/L (ref 135–145)

## 2019-03-31 LAB — GLUCOSE, CAPILLARY
Glucose-Capillary: 100 mg/dL — ABNORMAL HIGH (ref 70–99)
Glucose-Capillary: 119 mg/dL — ABNORMAL HIGH (ref 70–99)
Glucose-Capillary: 140 mg/dL — ABNORMAL HIGH (ref 70–99)
Glucose-Capillary: 142 mg/dL — ABNORMAL HIGH (ref 70–99)

## 2019-03-31 MED ORDER — SENNOSIDES-DOCUSATE SODIUM 8.6-50 MG PO TABS
2.0000 | ORAL_TABLET | Freq: Every day | ORAL | Status: DC
  Administered 2019-04-01 – 2019-04-03 (×3): 2 via ORAL
  Filled 2019-03-31 (×3): qty 2

## 2019-03-31 NOTE — Progress Notes (Signed)
Patient ID: Stacy Walls, female   DOB: Sep 15, 1932, 85 y.o.   MRN: [id number] [facility] Progress Note   Reconsulted to see Mrs. Dematteo for ongoing care of her chronic kidney disease with volume overload.  Assessment/ Plan:   1.  Chronic kidney disease stage III: Baseline creatinine ranging 1.7-2.0 per outpatient follow-up records from Dr. Justin Mend.  Her chronic kidney disease appears to be primarily CRS associated with her chronic diastolic heart failure.  Reports good response to lasix overnight with some improvement of leg edema reported (UOP not charted and weight paradoxically higher). 2.  Hyponatremia: Suspected to have been volume mediated and improving on lasix at this time.  She possibly has a reset osmostat at her age and is further compounded by her history of diastolic heart failure; monitor with diuresis. 3.  Deconditioning following admission for metabolic encephalopathy/hyponatremia: Currently ongoing inpatient rehabilitation. 4.  Hypertension: Blood pressures noted to be marginally elevated on metoprolol, will make additional adjustment of antihypertensive therapy if remains elevated overnight.  Subjective:   Reports to be feeling better this morning with improved leg tightness and BMs yesterday   Objective:   BP (!) 152/87 (BP Location: Left Arm)   Pulse 66   Temp 98.4 F (36.9 C)   Resp 16   Ht 5\' 2"  (1.575 m)   Wt 105.6 kg   SpO2 99%   BMI 42.58 kg/m   Intake/Output Summary (Last 24 hours) at 03/31/2019 0946 Last data filed at 03/31/2019 0745 Gross per 24 hour  Intake 540 ml  Output -  Net 540 ml   Weight change: 1 kg  Physical Exam: Gen: Comfortably resting propped up in bed CVS: Pulse regular rhythm, normal rate, S1 and S2 normal Resp: Clear to auscultation, no rales/rhonchi Abd: Soft, obese, nontender Ext: 1+ bilateral ankle edema with chronic right upper extremity edema.  Imaging: No results found.  Labs: BMET Recent Labs   Lab 03/25/19 0437 03/27/19 0540 03/28/19 0542 03/30/19 0516 03/31/19 0653  NA 130* 131* 133* 133* 135  K 4.2 4.0 4.1 4.2 4.2  CL 95* 98 98 99 101  CO2 25 24 26 24 24   GLUCOSE 93 115* 110* 107* 109*  BUN 22 25* 23 27* 26*  CREATININE 1.68* 1.65* 1.52* 1.67* 1.72*  CALCIUM 9.9 9.7 9.6 9.7 10.0  PHOS  --   --   --   --  3.8   CBC Recent Labs  Lab 03/25/19 0437 03/26/19 0456 03/27/19 0540 03/30/19 0516  WBC 3.3* 3.5* 3.6* 3.7*  NEUTROABS 1.4* 1.7 1.7 1.5*  HGB 8.0* 8.3* 8.6* 8.9*  HCT 24.3* 25.0* 25.5* 26.9*  MCV 92.7 94.0 92.7 94.7  PLT 124* 123* 126* 126*    Medications:    . aspirin EC  81 mg Oral Daily  . atorvastatin  20 mg Oral Daily  . diclofenac sodium  2 g Topical QID  . feeding supplement (ENSURE ENLIVE)  237 mL Oral BID BM  . ferrous sulfate  325 mg Oral Daily  . fluticasone  2 spray Each Nare Daily  . furosemide  40 mg Oral Daily  . heparin  5,000 Units Subcutaneous Q8H  . insulin aspart  0-9 Units Subcutaneous TID WC  . levothyroxine  112 mcg Oral Q0600  . magic mouthwash  10 mL Oral TID  . metoprolol tartrate  25 mg Oral BID  . pantoprazole  40 mg Oral Daily  . [START ON 04/01/2019] senna-docusate  2 tablet Oral Daily  . tamoxifen  20 mg Oral Daily   [name], MD 03/31/2019, 9:46 AM

## 2019-03-31 NOTE — Progress Notes (Signed)
[place] PHYSICAL MEDICINE & REHABILITATION PROGRESS NOTE   Subjective/Complaints:  Pt reports feeling a lot better- had at least 5BMs last night- doing well- HA somewhat better- nausea gone- little dizzy, like getting HA again, wants tylenol. Oncology came and saw her- said she needs breast U/S after she leaves. Nephrology reports Lasix given last night- but weight somehow up with 5 BMs and Lasix.    ROS- pt denies SOB, CP, HA, (-)Nausea, no vomiting, no diarrhea Objective:   No results found. Recent Labs    03/30/19 0516  WBC 3.7*  HGB 8.9*  HCT 26.9*  PLT 126*   Recent Labs    03/30/19 0516 03/31/19 0653  NA 133* 135  K 4.2 4.2  CL 99 101  CO2 24 24  GLUCOSE 107* 109*  BUN 27* 26*  CREATININE 1.67* 1.72*  CALCIUM 9.7 10.0    Intake/Output Summary (Last 24 hours) at 03/31/2019 1735 Last data filed at 03/31/2019 1230 Gross per 24 hour  Intake 520 ml  Output -  Net 520 ml     Physical Exam: Vital Signs Blood pressure (!) 136/49, pulse (!) 55, temperature (!) 97.5 F (36.4 C), resp. rate 20, height 5\' 2"  (1.575 m), weight 105.6 kg, SpO2 100 %.   Physical Exam: Vitals, labs and nursing notes reviewed ACZ:YSAYT in warmer room- almost too warm for me; pt happy about warmth- snuggled in warm comforter in bed NAD HEENT:conjugate gaze; wearing cap on hair still  Cardio: - RRR- almost bradycardic Chest: CTA B/L; R breast lateral aspect is hard to touch- like radiation changes Abd: soft, NT, ND, (+)BS Ext: no edema- wooding of skin on LEs seen Skin: intact Neuro: Patient is alert sitting up in bed. Ox3  Musculoskeletal: Strength diffusely 4/5; no focal deficits.  Psych: pleasant, normal affect- wanders in topics- less so today- straight on topic the whole time  Assessment/Plan: 1. Functional deficits secondary to Debility due to AKI with acute on chronic renal impairment  which require 3+ hours per day of interdisciplinary therapy in a comprehensive inpatient  rehab setting.  Physiatrist is providing close team supervision and 24 hour management of active medical problems listed below.  Physiatrist and rehab team continue to assess barriers to discharge/monitor patient progress toward functional and medical goals  Care Tool:  Bathing    Body parts bathed by patient: Right arm, Left arm, Chest, Abdomen, Front perineal area, Buttocks, Right upper leg, Left upper leg, Face, Right lower leg, Left lower leg   Body parts bathed by helper: Right lower leg, Left lower leg     Bathing assist Assist Level: Supervision/Verbal cueing     Upper Body Dressing/Undressing Upper body dressing   What is the patient wearing?: Pull over shirt    Upper body assist Assist Level: Supervision/Verbal cueing    Lower Body Dressing/Undressing Lower body dressing      What is the patient wearing?: Skirt     Lower body assist Assist for lower body dressing: Supervision/Verbal cueing     Toileting Toileting    Toileting assist Assist for toileting: Supervision/Verbal cueing     Transfers Chair/bed transfer  Transfers assist     Chair/bed transfer assist level: Contact Guard/Touching assist Chair/bed transfer assistive device: Programmer, multimedia   Ambulation assist      Assist level: Contact Guard/Touching assist Assistive device: Walker-rolling Max distance: 70'   Walk 10 feet activity   Assist  Walk 10 feet activity did not occur: Safety/medical concerns(unsafe  without AD)  Assist level: Supervision/Verbal cueing Assistive device: Walker-rolling   Walk 50 feet activity   Assist Walk 50 feet with 2 turns activity did not occur: Safety/medical concerns(unsafe without AD)  Assist level: Supervision/Verbal cueing Assistive device: Walker-rolling    Walk 150 feet activity   Assist Walk 150 feet activity did not occur: Safety/medical concerns(unsafe without AD)         Walk 10 feet on uneven surface   activity   Assist Walk 10 feet on uneven surfaces activity did not occur: Safety/medical concerns(unsafe without AD)   Assist level: Contact Guard/Touching assist Assistive device: Teacher, English as a foreign language activity did not occur: Safety/medical concerns(Patient with bowel urgency during evaluation)         Wheelchair 50 feet with 2 turns activity    Assist    Wheelchair 50 feet with 2 turns activity did not occur: Safety/medical concerns(Patient with bowel urgency during evaluation)       Wheelchair 150 feet activity     Assist  Wheelchair 150 feet activity did not occur: Safety/medical concerns(Patient with bowel urgency during evaluation)       Blood pressure (!) 136/49, pulse (!) 55, temperature (!) 97.5 F (36.4 C), resp. rate 20, height 5\' 2"  (8.338 m), weight 105.6 kg, SpO2 100 %.  Medical Problem List and Plan: 1.  Debility secondary to acute on chronic hyponatremia/AKI with CKD stage III- nml outpt Cr 1.7-2.0 per nephrology             -patient may shower             -ELOS/Goals: ModI in PT, OT, I in SLP 2.  Antithrombotics: -DVT/anticoagulation: Subcutaneous heparin             -antiplatelet therapy: Aspirin 81 mg daily 3. Pain Management: Voltaren gel 4 times daily 4. Mood: Provide emotional support             -antipsychotic agents: N/A 5. Neuropsych: This patient is capable of making decisions on her own behalf. 6. Skin/Wound Care: Routine skin checks 7. Fluids/Electrolytes/Nutrition: Routine in and outs with follow-up chemistries. Continue Ensure twice per day. 8.  Diastolic congestive heart failure.  Lasix 20 mg daily.  Monitor for any signs of fluid overload Filed Weights   03/27/19 1541 03/30/19 0518 03/31/19 0345  Weight: 106.5 kg 104.6 kg 105.6 kg    9.  AKI with CKD stage III/hyponatremia. .  Continue fluid restriction as well as low-dose Lasix-   12/10-Cr was increased to 1.67 from 1.51- is mild  increase however is on fluid restriction so can't really give more IVFs/PO- called renal just to touch base.  12/11- Cr up to 1.72- per nephrology her baseline outpt Cr is 1.7-2,9 10.  Hypertension.  Hydralazine 50 mg twice daily  12/11- BP was a little up yesterday but better today- will maintain BP meds 11.  Diabetes mellitus.  Hemoglobin A1c 6.3.  SSI.   CBG (last 3)  Recent Labs    03/31/19 0615 03/31/19 1125 03/31/19 1629  GLUCAP 100* 119* 140*    12/11- well controlled 12.  History of right breast cancer.  Continue tamoxifen.  Follow-up out patient oncology services  12/9- feeling like R breast is feeling more hard to touch- likely due to radiation- but will call oncologist -was told no new signs of CA. Also needs mammogram that was due while in hospital- needs to do after d/c.   12/11-  Oncology says she will get U/S as of breast after d/c as well.  13.  Hypothyroidism.  Continue Synthroid/Cytomel  12/8- Feels cold as ice- most recent TSH >11-was 5.5 a few days prior, so could be due to acute issues- then 7.98 1 month prior- L/M for Dr Renato Shin her Endo and hopefully will hear back and titrate her thyroid levels.   -heard back- will increase Synthroid to 112 mcg daily and d/c Cytomel per Dr Loanne Drilling. 14.  Anemia of chronic disease.  Continue iron supplement 15.  Hyperlipidemia.  Lipitor 16.  GERD.  Protonix 17. Morbid obesity: Dietary follow-up. 18. Constipation  12/8- trying to go today- ordered Miralax, Sorbitol, and suppository prn and added Senokot S 2 tabs in AM to help her go- not on bowel meds.   12/9- was able to have 2 large BMs  12/10- still feeling constipated- added miralax AND increased senokot to 2 tabs BID- likely due to thyroid issues.  12/11- had 5 BMs lastnight- stopped scheduled miralax and made prn and reduced senokot a little  18. Thrombocytopenia  12/8- Plts of 126- is stable currently  12/10- Plts 126k- pretty stable- s/p radiation? 19. Disposition:  Upon discharge, she should have follow-up with PCP, physiatry, and nephrology and Endocrinology- Dr Loanne Drilling.     LOS: 4 days A FACE TO FACE EVALUATION WAS PERFORMED  Christabell Loseke 03/31/2019, 5:35 PM

## 2019-03-31 NOTE — Progress Notes (Signed)
Occupational Therapy Discharge Summary  Patient Details  Name: Stacy Walls MRN: [id number] Date of Birth: 12/28/1932  Patient has met 6 of 7 long term goals due to improved activity tolerance, improved balance, postural control, ability to compensate for deficits and improved awareness.  Patient to discharge at overall Modified Independent level.  Patient's care partner is independent to provide the necessary intermittent assistance at discharge.  Patient has demonstrated ability to complete bathing at Mod I level in room shower, however pt reports that she will most likely not shower unless someone is in the house with her.  Reasons goals not met: Unable to complete meal prep goal as pt requiring increased time with self-care tasks and frequent toileting needs, not allowing for therapy time outside of room. Have educated pt on potential safety concerns with meal prep.  Recommendation:  Patient will benefit from ongoing skilled OT services in home health setting to continue to advance functional skills in the area of BADL, iADL and Reduce care partner burden.  Equipment: No equipment provided  Reasons for discharge: treatment goals met and discharge from hospital  Patient/family agrees with progress made and goals achieved: Yes  OT Discharge Precautions/Restrictions  Precautions Precautions: Fall General Vital Signs Therapy Vitals Temp: 98.7 F (37.1 C) Temp Source: Oral Pulse Rate: (!) 55 Resp: 18 BP: 138/73 Patient Position (if appropriate): Sitting Oxygen Therapy SpO2: 99 % O2 Device: Room Air Pain Pain Assessment Pain Scale: 0-10 Pain Score: 0-No pain ADL Eating: Independent Grooming: Independent Where Assessed-Grooming: Bed level Upper Body Bathing: Modified independent Where Assessed-Upper Body Bathing: Shower Lower Body Bathing: Modified independent Where Assessed-Lower Body Bathing: Shower Upper Body Dressing: Modified independent (Device) Where  Assessed-Upper Body Dressing: Edge of bed Lower Body Dressing: Modified independent Where Assessed-Lower Body Dressing: Edge of bed Toileting: Modified independent Where Assessed-Toileting: Risk analyst Method: Counselling psychologist: (RW) Social research officer, government: Chief Financial Officer Method: Heritage manager: Network engineer bench(RW) Vision Baseline Vision/History: No visual deficits Patient Visual Report: No change from baseline Vision Assessment?: No apparent visual deficits Perception  Perception: Within Functional Limits Praxis Praxis: Intact Cognition Overall Cognitive Status: Impaired/Different from baseline Arousal/Alertness: Awake/alert Orientation Level: Oriented X4 Attention: Sustained Memory: Impaired Memory Impairment: Decreased recall of new information Awareness: Impaired Awareness Impairment: Emergent impairment Problem Solving: Impaired Problem Solving Impairment: Functional complex Executive Function: Self Monitoring;Self Correcting Self Monitoring: Impaired Self Monitoring Impairment: Functional complex;Verbal complex Self Correcting: Impaired Self Correcting Impairment: Functional complex;Verbal complex Safety/Judgment: Impaired Sensation Sensation Light Touch: Appears Intact Hot/Cold: Appears Intact Proprioception: Appears Intact Coordination Gross Motor Movements are Fluid and Coordinated: No Fine Motor Movements are Fluid and Coordinated: Yes Coordination and Movement Description: Generalized weakness and decreased activity tolerance Extremity/Trunk Assessment RUE Assessment RUE Assessment: Within Functional Limits LUE Assessment LUE Assessment: Within Functional Limits   Zackary Mckeone 04/02/2019, 4:14 PM

## 2019-03-31 NOTE — Progress Notes (Signed)
Physical Therapy Session Note  Patient Details  Name: Stacy Walls MRN: [id number] Date of Birth: January 20, 1933  Today's Date: 03/31/2019 PT Individual Time: 1300-1400;  1522-1610   PT Individual Time Calculation (min): 60 min , 48 min  Short Term Goals: Week 1:  PT Short Term Goal 1 (Week 1): STG=LTG due to short ELOS.     Skilled Therapeutic Interventions/Progress Updates:   tx 1:  Pt seated in recliner.  Pt denied pain.  She stated that she needed to urinate. Sit> stand with supervision, and cues for techniques and rocking.   Gait training in room with RW to toilet, close supervision.  Toilet transfer with RW, close supervision.  Pt managed clothes and peri care in standing, with supervision.  Hand washing at sink in standing, visual cues.  Gait training in hallway with multiple turns, x 90' with RW, close supervision.  Pt SOB after 90', linmiting distance.  PT educated pt in diaphragmatic breathing, with poor carry over.  PT urged pt to sing or whistle at home to improve lung volumes and breath control.  Pt reported that she that she sang in a choir x 50 years, so she will try sing.   Therapeutic exercises performed with LEs to increase strength for functional mobility: seated R/L ankle pumps x 25 each; R/L long arc quad knee extensions x 10 each.   At end of session, pt seated in relciner with needs at hand and seat pad alarm set.  tx 2:  Pt seated in recliner.  She rated HA 5/10', premedicated.  Reviewed breathing techniques; pt remembered the importance of slowing her breathing down, when winded, but needed cues for the value of singing around the house for breath control.   Sit> stand without cues for techniques, superviison.  Stand pivot with RW to wc, supervision.    Therapeutic exercises performed with LEs to increase general endurance and strength for functional mobility: using KInetron from w/c level at resistance 40 cm/sec,  x 30 cycles x 3 targeting quadriceps, x  30 cycles x 2 targeting gluteal muscles.   Pt was thrilled with her energy, and asked PT to send a video to her dtr and son.   W/c> recliner stand pivot with cues for safety, as pt initially placed bil hands on RW to attempt to stand.    At end of session, pt in recliner with seat pad alarm set and needs at hand.  Bil LEs elevated on leg rest.  Reviewed importance of ankle pumps to address foot and LL edema.         Therapy Documentation Precautions:  Precautions Precautions: Fall Precaution Comments: Bowel incontinence Restrictions Weight Bearing Restrictions: No       Therapy/Group: Individual Therapy  Stacy Walls 03/31/2019, 4:18 PM

## 2019-03-31 NOTE — Progress Notes (Signed)
Occupational Therapy Session Note  Patient Details  Name: Stacy Walls MRN: [id number] Date of Birth: Apr 23, 1932  Today's Date: 03/31/2019 OT Individual Time: 1000-1115 OT Individual Time Calculation (min): 75 min    Short Term Goals: Week 1:  OT Short Term Goal 1 (Week 1): STG=LTG  Skilled Therapeutic Interventions/Progress Updates:    OT intervention with focus on functional amb with RW, toileting, BADL training including bathing at shower level and dressing with sit<>stand from seat, discharge planning, and safety awareness to increase independence with BADLs and prepare for discharge 12/14. Pt amb with RW to bathroom to use toilet prior to shower.  Pt states she hasn't showered at home since her husband died at beginning of year. Pt usually bathes at sink. Pt completed all bathing tasks with supervision. Pt required assistance with donning socks. Pt requires more than a reasonable amount of time to complete all tasks. Pt remained seated in recliner with seat alarm activated and all needs within reach.   Therapy Documentation Precautions:  Precautions Precautions: Fall Precaution Comments: Bowel incontinence Restrictions Weight Bearing Restrictions: No  Pain:  Pt c/o "slight" headache; pt states she was given something earlier  Therapy/Group: Individual Therapy  Leroy Libman 03/31/2019, 12:24 PM

## 2019-03-31 NOTE — Discharge Instructions (Signed)
Inpatient Rehab Discharge Instructions  [name] Discharge date and time: No discharge date for patient encounter.   Activities/Precautions/ Functional Status: Activity: activity as tolerated Diet: Mechanical soft 1500 mL fluid restriction Wound Care: none needed Functional status:  ___ No restrictions     ___ Walk up steps independently ___ 24/7 supervision/assistance   ___ Walk up steps with assistance ___ Intermittent supervision/assistance  ___ Bathe/dress independently ___ Walk with walker     _x__ Bathe/dress with assistance ___ Walk Independently    ___ Shower independently ___ Walk with assistance    ___ Shower with assistance ___ No alcohol     ___ Return to work/school ________   COMMUNITY REFERRALS UPON DISCHARGE:    Home Health:   PT     OT                       Agency:  [facility]   Phone: [phone]     Special Instructions: No driving smoking or alcohol   My questions have been answered and I understand these instructions. I will adhere to these goals and the provided educational materials after my discharge from the hospital.  Patient/Caregiver Signature _______________________________ Date __________  Clinician Signature _______________________________________ Date __________  Please bring this form and your medication list with you to all your follow-up doctor's appointments.

## 2019-03-31 NOTE — Progress Notes (Signed)
Speech Language Pathology Daily Session Note  Patient Details  Name: Keryn Nessler MRN: [id number] Date of Birth: 02-28-1933  Today's Date: 03/31/2019 SLP Individual Time: 1430-1500 SLP Individual Time Calculation (min): 30 min  Short Term Goals: Week 1: SLP Short Term Goal 1 (Week 1): Pt will demonstrate susatined attention to functional tasks with Min A verbal cues for redirection. SLP Short Term Goal 2 (Week 1): Pt will demonstrate functional problem solving during basic and mildly complex tasks with Min A verbal/visual cues. SLP Short Term Goal 3 (Week 1): Pt will demonstrate emergent awareness by identifying errors during functional tasks with Min A verbal/visual cues. SLP Short Term Goal 4 (Week 1): Pt will recall day to day/new information with Min A verbal cues for use of compensatory memory strategies.  Skilled Therapeutic Interventions: Skilled treatment session focused on cognitive goals. Treatment session focused on d/c planning and anticipatory awareness. Patient reported she was going home with her grandson and independently verbalized strategies to utilize at home to maximize overall safety and independence with functional tasks. Patient also reported she was receiving "home care" services, therefore, CSW came into room and clarified that she is receiving home health services and not assistance in the home as in cooking and cleaning. Patient left upright in recliner with all needs within reach and alarm on. Continue with current plan of care.       Pain No/Denies Pain   Therapy/Group: Individual Therapy  Kaylla Cobos 03/31/2019, 3:15 PM

## 2019-03-31 NOTE — Progress Notes (Signed)
Pt continued to have liquid stools up to about 10pm. States is feeling better, more active this am

## 2019-04-01 ENCOUNTER — Inpatient Hospital Stay (HOSPITAL_COMMUNITY): Payer: Medicare Other | Admitting: Speech Pathology

## 2019-04-01 ENCOUNTER — Inpatient Hospital Stay (HOSPITAL_COMMUNITY): Payer: Medicare Other | Admitting: Occupational Therapy

## 2019-04-01 ENCOUNTER — Inpatient Hospital Stay (HOSPITAL_COMMUNITY): Payer: Medicare Other | Admitting: Physical Therapy

## 2019-04-01 DIAGNOSIS — E039 Hypothyroidism, unspecified: Secondary | ICD-10-CM

## 2019-04-01 DIAGNOSIS — I10 Essential (primary) hypertension: Secondary | ICD-10-CM

## 2019-04-01 LAB — RENAL FUNCTION PANEL
Albumin: 2.9 g/dL — ABNORMAL LOW (ref 3.5–5.0)
Anion gap: 9 (ref 5–15)
BUN: 27 mg/dL — ABNORMAL HIGH (ref 8–23)
CO2: 25 mmol/L (ref 22–32)
Calcium: 9.8 mg/dL (ref 8.9–10.3)
Chloride: 103 mmol/L (ref 98–111)
Creatinine, Ser: 1.85 mg/dL — ABNORMAL HIGH (ref 0.44–1.00)
GFR calc Af Amer: 28 mL/min — ABNORMAL LOW (ref >60)
GFR calc non Af Amer: 25 mL/min — ABNORMAL LOW (ref >60)
Glucose, Bld: 108 mg/dL — ABNORMAL HIGH (ref 70–99)
Phosphorus: 3.7 mg/dL (ref 2.5–4.6)
Potassium: 4.2 mmol/L (ref 3.5–5.1)
Sodium: 137 mmol/L (ref 135–145)

## 2019-04-01 LAB — GLUCOSE, CAPILLARY
Glucose-Capillary: 106 mg/dL — ABNORMAL HIGH (ref 70–99)
Glucose-Capillary: 128 mg/dL — ABNORMAL HIGH (ref 70–99)
Glucose-Capillary: 123 mg/dL — ABNORMAL HIGH (ref 70–99)
Glucose-Capillary: 138 mg/dL — ABNORMAL HIGH (ref 70–99)

## 2019-04-01 MED ORDER — HYDRALAZINE HCL 10 MG PO TABS
10.0000 mg | ORAL_TABLET | Freq: Three times a day (TID) | ORAL | Status: DC
  Administered 2019-04-01 – 2019-04-03 (×6): 10 mg via ORAL
  Filled 2019-04-01 (×6): qty 1

## 2019-04-01 NOTE — Progress Notes (Signed)
Occupational Therapy Session Note  Patient Details  Name: Stacy Walls MRN: [id number] Date of Birth: [birth year]  Today's Date: 04/01/2019 OT Individual Time: 1300-1429 OT Individual Time Calculation (min): 89 min   Short Term Goals: Week 1:  OT Short Term Goal 1 (Week 1): STG=LTG  Skilled Therapeutic Interventions/Progress Updates:    Pt greeted in the recliner, requesting transfer assist for toileting. Supervision for ambulatory toilet transfer using RW. Pt with B+B void, set her up with warm prune juice to expediate BM process. Pt able to complete hygiene while standing with supervision. She then wanted to bathe EOB vs shower today, and did so with significantly increased time and Min A for reaching feet. She states she can use the LH sponge to meet these tasks demands more thoroughly in the shower. Pt transitioned to lying flat onto bed and elevated trunk multiple times to complete perihygiene and apply powder under skin folds. OT assisted with Teds and footwear. Pt opted to not change her hospital gown. In long sitting position, pt scooted herself up towards [facility] before transitioning to supine. Left her with all needs, bed alarm set, and NT present to assess vitals.   Pt c/o small HA at end of session. OT placed lavender scented cotton balls in her pillowcase to address.   Therapy Documentation Precautions:  Precautions Precautions: Fall Precaution Comments: Bowel incontinence Restrictions Weight Bearing Restrictions: No Vital Signs: Therapy Vitals Temp: 97.9 F (36.6 C) Pulse Rate: (!) 54 Resp: 16 BP: (!) 144/59 Patient Position (if appropriate): Lying Oxygen Therapy SpO2: 100 % O2 Device: Room Air Pain: No c/o pain during session    ADL: ADL Eating: Independent Grooming: Setup Where Assessed-Grooming: Bed level Upper Body Bathing: Setup Where Assessed-Upper Body Bathing: Shower Lower Body Bathing: Minimal assistance Where Assessed-Lower Body Bathing:  Shower Upper Body Dressing: Setup Where Assessed-Upper Body Dressing: Edge of bed Lower Body Dressing: Minimal assistance Where Assessed-Lower Body Dressing: Edge of bed Toileting: Contact guard Where Assessed-Toileting: Glass blower/designer: Therapist, music Method: Product/process development scientist Method: Ambulating      Therapy/Group: Individual Therapy  Zully Frane A Drelyn Pistilli 04/01/2019, 3:58 PM

## 2019-04-01 NOTE — Progress Notes (Signed)
Speech Language Pathology Daily Session Note  Patient Details  Name: Stacy Walls MRN: [id number] Date of Birth: January 15, 1933  Today's Date: 04/01/2019 SLP Individual Time: 1224-8250 SLP Individual Time Calculation (min): 30 min  Short Term Goals: Week 1: SLP Short Term Goal 1 (Week 1): Pt will demonstrate susatined attention to functional tasks with Min A verbal cues for redirection. SLP Short Term Goal 2 (Week 1): Pt will demonstrate functional problem solving during basic and mildly complex tasks with Min A verbal/visual cues. SLP Short Term Goal 3 (Week 1): Pt will demonstrate emergent awareness by identifying errors during functional tasks with Min A verbal/visual cues. SLP Short Term Goal 4 (Week 1): Pt will recall day to day/new information with Min A verbal cues for use of compensatory memory strategies.  Skilled Therapeutic Interventions: Patient received skilled SLP services targeting cognitive goals. Upon entry to room patient was attempting to call the kitchen to place lunch/dinner orders but reported difficulty being able to do so. Patient required min verbal cues to sequence steps to calling the kitchen to place her meal orders for today. Patient independently recalled 3 tasks she completed with therapy yesterday. With min verbal cues patient recalled 2 external compensatory memory strategies to implement in the home environment. At the end of therapy session patient was upright in bed, bed alarm activated, and all needs within reach.  Pain Pain Assessment Pain Scale: 0-10 Pain Score: 0-No pain  Therapy/Group: Individual Therapy  Cristy Folks 04/01/2019, 9:23 AM

## 2019-04-01 NOTE — Progress Notes (Addendum)
Patient ID: Stacy Walls, female   DOB: Aug 05, 1932, 85 y.o.   MRN: [id number] I reviewed the patient's chart with focus on weight, labs, UOP and overnight events. Charted weight is clearly erroneous (10kg higher than yesterday) BP intermittently elevated---start hydralazine 10mg  PO TID. Will see patient tomorrow.  [name] MD [facility]. Office # [phone] Pager # [phone] 9:33 AM

## 2019-04-01 NOTE — Progress Notes (Signed)
[facility] PHYSICAL MEDICINE & REHABILITATION PROGRESS NOTE   Subjective/Complaints:  Improved strength and activity tolerance. Had question about "cyst" in lower abdomen  ROS: Patient denies fever, rash, sore throat, blurred vision, nausea, vomiting, diarrhea, cough, shortness of breath or chest pain,  headache, or mood change.    Objective:   No results found. Recent Labs    03/30/19 0516  WBC 3.7*  HGB 8.9*  HCT 26.9*  PLT 126*   Recent Labs    03/31/19 0653 04/01/19 0544  NA 135 137  K 4.2 4.2  CL 101 103  CO2 24 25  GLUCOSE 109* 108*  BUN 26* 27*  CREATININE 1.72* 1.85*  CALCIUM 10.0 9.8    Intake/Output Summary (Last 24 hours) at 04/01/2019 0957 Last data filed at 04/01/2019 6606 Gross per 24 hour  Intake 220 ml  Output 2 ml  Net 218 ml     Physical Exam: Vital Signs Blood pressure (!) 166/66, pulse 61, temperature 98.3 F (36.8 C), resp. rate 19, height 5\' 2"  (1.575 m), weight 115.3 kg, SpO2 100 %.   Physical Exam: Constitutional: No distress . Vital signs reviewed. HEENT: EOMI, oral membranes moist Neck: supple Cardiovascular: RRR without murmur. No JVD    Respiratory: CTA Bilaterally without wheezes or rales. Normal effort    GI: BS +, non-tender, non-distended. 1.5 hardened are near her heparin injection site/bruise Ext: no edema- wooding of skin on LEs seen Skin: intact Neuro: Patient is alert sitting up in bed. Ox3  Musculoskeletal: Strength diffusely 4/5; no focal deficits.  Psych: pleasant  Assessment/Plan: 1. Functional deficits secondary to Debility due to AKI with acute on chronic renal impairment  which require 3+ hours per day of interdisciplinary therapy in a comprehensive inpatient rehab setting.  Physiatrist is providing close team supervision and 24 hour management of active medical problems listed below.  Physiatrist and rehab team continue to assess barriers to discharge/monitor patient progress toward functional and medical  goals  Care Tool:  Bathing    Body parts bathed by patient: Right arm, Left arm, Chest, Abdomen, Front perineal area, Buttocks, Right upper leg, Left upper leg, Face, Right lower leg, Left lower leg   Body parts bathed by helper: Right lower leg, Left lower leg     Bathing assist Assist Level: Supervision/Verbal cueing     Upper Body Dressing/Undressing Upper body dressing   What is the patient wearing?: Pull over shirt    Upper body assist Assist Level: Supervision/Verbal cueing    Lower Body Dressing/Undressing Lower body dressing      What is the patient wearing?: Skirt     Lower body assist Assist for lower body dressing: Supervision/Verbal cueing     Toileting Toileting    Toileting assist Assist for toileting: Supervision/Verbal cueing     Transfers Chair/bed transfer  Transfers assist     Chair/bed transfer assist level: Contact Guard/Touching assist Chair/bed transfer assistive device: Programmer, multimedia   Ambulation assist      Assist level: Contact Guard/Touching assist Assistive device: Walker-rolling Max distance: 70'   Walk 10 feet activity   Assist  Walk 10 feet activity did not occur: Safety/medical concerns(unsafe without AD)  Assist level: Supervision/Verbal cueing Assistive device: Walker-rolling   Walk 50 feet activity   Assist Walk 50 feet with 2 turns activity did not occur: Safety/medical concerns(unsafe without AD)  Assist level: Supervision/Verbal cueing Assistive device: Walker-rolling    Walk 150 feet activity   Assist Walk 150 feet  activity did not occur: Safety/medical concerns(unsafe without AD)         Walk 10 feet on uneven surface  activity   Assist Walk 10 feet on uneven surfaces activity did not occur: Safety/medical concerns(unsafe without AD)   Assist level: Contact Guard/Touching assist Assistive device: Teacher, English as a foreign language activity  did not occur: Safety/medical concerns(Patient with bowel urgency during evaluation)         Wheelchair 50 feet with 2 turns activity    Assist    Wheelchair 50 feet with 2 turns activity did not occur: Safety/medical concerns(Patient with bowel urgency during evaluation)       Wheelchair 150 feet activity     Assist  Wheelchair 150 feet activity did not occur: Safety/medical concerns(Patient with bowel urgency during evaluation)       Blood pressure (!) 166/66, pulse 61, temperature 98.3 F (36.8 C), resp. rate 19, height 5\' 2"  (1.575 m), weight 115.3 kg, SpO2 100 %.  Medical Problem List and Plan: 1.  Debility secondary to acute on chronic hyponatremia/AKI with CKD stage III- nml outpt Cr 1.7-2.0 per nephrology             -patient may shower             -ELOS/Goals: ModI in PT, OT, I in SLP 2.  Antithrombotics: -DVT/anticoagulation: Subcutaneous heparin--small hematoma from injection--explained to patient---can continue with injections              -antiplatelet therapy: Aspirin 81 mg daily 3. Pain Management: Voltaren gel 4 times daily 4. Mood: Provide emotional support             -antipsychotic agents: N/A 5. Neuropsych: This patient is capable of making decisions on her own behalf. 6. Skin/Wound Care: Routine skin checks 7. Fluids/Electrolytes/Nutrition: Routine in and outs with follow-up chemistries. Continue Ensure twice per day. 8.  Diastolic congestive heart failure.  Lasix 20 mg daily.  Monitor for any signs of fluid overload Filed Weights   03/30/19 0518 03/31/19 0345 04/01/19 6948  Weight: 104.6 kg 105.6 kg 115.3 kg   12/12 doubt today's weight is real  9.  AKI with CKD stage III/hyponatremia. .  Continue fluid restriction as well as low-dose Lasix-   12/10-Cr was increased to 1.67 from 1.51- is mild increase however is on fluid restriction so can't really give more IVFs/PO- called renal just to touch base.  12/11- Cr up to 1.72- per nephrology her  baseline outpt Cr is 1.7-2,9 10.  Hypertension.  Hydralazine 50 mg twice daily  12/11- BP was a little up yesterday but better today- will maintain BP meds 11.  Diabetes mellitus.  Hemoglobin A1c 6.3.  SSI.   CBG (last 3)  Recent Labs    03/31/19 1629 03/31/19 2114 04/01/19 0611  GLUCAP 140* 142* 106*    12/12- well controlled 12.  History of right breast cancer.  Continue tamoxifen.  Follow-up out patient oncology services  12/9- feeling like R breast is feeling more hard to touch- likely due to radiation- but will call oncologist -was told no new signs of CA. Also needs mammogram that was due while in hospital- needs to do after d/c.   12/11- Oncology says she will get U/S as of breast after d/c as well.  13.  Hypothyroidism.  Continue Synthroid/Cytomel  12/8- Feels cold as ice- most recent TSH >11-was 5.5 a few days prior, so could be due to acute  issues- then 7.98 1 month prior- L/M for Dr Renato Shin her Endo and hopefully will hear back and titrate her thyroid levels.   -heard back- will increase Synthroid to 112 mcg daily and d/c Cytomel per Dr Loanne Drilling.  -12/12 remains very chilly 14.  Anemia of chronic disease.  Continue iron supplement 15.  Hyperlipidemia.  Lipitor 16.  GERD.  Protonix 17. Morbid obesity: Dietary follow-up. 18. Constipation  12/8- trying to go today- ordered Miralax, Sorbitol, and suppository prn and added Senokot S 2 tabs in AM to help her go- not on bowel meds.   12/9- was able to have 2 large BMs  12/10- still feeling constipated- added miralax AND increased senokot to 2 tabs BID- likely due to thyroid issues.  12/11- had 5 BMs lastnight- stopped scheduled miralax and made prn and reduced senokot a little   12/12- no bms since blow out--observe on above reg 18. Thrombocytopenia  12/8- Plts of 126- is stable currently  12/10- Plts 126k- pretty stable- s/p radiation? 19. Disposition: Upon discharge, she should have follow-up with PCP, physiatry, and  nephrology and Endocrinology- Dr Loanne Drilling.     LOS: 5 days A FACE TO FACE EVALUATION WAS PERFORMED  Meredith Staggers 04/01/2019, 9:57 AM

## 2019-04-01 NOTE — Progress Notes (Signed)
Physical Therapy Session Note  Patient Details  Name: Stacy Walls MRN: [id number] Date of Birth: [birth year]  Today's Date: 04/01/2019 PT Individual Time: 1010-1105 PT Individual Time Calculation (min): 55 min   Short Term Goals: Week 1:  PT Short Term Goal 1 (Week 1): STG=LTG due to short ELOS.  Skilled Therapeutic Interventions/Progress Updates:   Pt received supine in bed and agreeable to PT. Supine>sit transfer with supervision assist and min cues for RLE management around the foot of the bed. PT applied thigh high teds for BLE edema management and cut elestic on grip soc to reduce constricting areas. Stand pivot transfer x 2 throughout treatment with supevisoin assist. LE therex: sit<>stand with UE on RW x 5. 3# ankle weight with LAQ, hip flexion, hip abduction, and ankle DF. Each completed x 10 BLE with cues for full ROM. Gait training with RW x 85f and supervision assist from PT for safety; no LOB noted throughout. WC mobility x 1065ffor UE strengthening with min assist to stay centered in hall. Patient returned to room and left sitting in recliner with call bell in reach and all needs met.         Therapy Documentation Precautions:  Precautions Precautions: Fall Precaution Comments: Bowel incontinence Restrictions Weight Bearing Restrictions: No Pain: Pain Assessment Pain Scale: 0-10 Pain Score: 6  Pain Type: Acute pain Pain Location: Shoulder Pain Orientation: Right;Left Pain Descriptors / Indicators: Aching Pain Frequency: Intermittent Pain Onset: On-going Pain Intervention(s): Medication (See eMAR)    Therapy/Group: Individual Therapy  AuLorie Phenix2/03/2019, 11:07 AM

## 2019-04-02 ENCOUNTER — Inpatient Hospital Stay (HOSPITAL_COMMUNITY): Payer: Medicare Other

## 2019-04-02 ENCOUNTER — Inpatient Hospital Stay (HOSPITAL_COMMUNITY): Payer: Medicare Other | Admitting: Occupational Therapy

## 2019-04-02 ENCOUNTER — Inpatient Hospital Stay (HOSPITAL_COMMUNITY): Payer: Medicare Other | Admitting: Speech Pathology

## 2019-04-02 LAB — GLUCOSE, CAPILLARY
Glucose-Capillary: 114 mg/dL — ABNORMAL HIGH (ref 70–99)
Glucose-Capillary: 106 mg/dL — ABNORMAL HIGH (ref 70–99)
Glucose-Capillary: 145 mg/dL — ABNORMAL HIGH (ref 70–99)
Glucose-Capillary: 115 mg/dL — ABNORMAL HIGH (ref 70–99)

## 2019-04-02 LAB — RENAL FUNCTION PANEL
Albumin: 2.8 g/dL — ABNORMAL LOW (ref 3.5–5.0)
Anion gap: 9 (ref 5–15)
BUN: 24 mg/dL — ABNORMAL HIGH (ref 8–23)
CO2: 25 mmol/L (ref 22–32)
Calcium: 9.6 mg/dL (ref 8.9–10.3)
Chloride: 102 mmol/L (ref 98–111)
Creatinine, Ser: 1.83 mg/dL — ABNORMAL HIGH (ref 0.44–1.00)
GFR calc Af Amer: 29 mL/min — ABNORMAL LOW (ref >60)
GFR calc non Af Amer: 25 mL/min — ABNORMAL LOW (ref >60)
Glucose, Bld: 107 mg/dL — ABNORMAL HIGH (ref 70–99)
Phosphorus: 3.6 mg/dL (ref 2.5–4.6)
Potassium: 4.3 mmol/L (ref 3.5–5.1)
Sodium: 136 mmol/L (ref 135–145)

## 2019-04-02 NOTE — Discharge Summary (Signed)
Physician Discharge Summary  Patient ID: Stacy Walls MRN: [id number] DOB/AGE: 11-01-1932 85 y.o.  Admit date: 03/27/2019 Discharge date: 04/03/2019  Discharge Diagnoses:  Principal Problem:   Debility Active Problems:   Hypothyroidism   Essential hypertension   CKD (chronic kidney disease), stage III (HCC)   Diabetes mellitus with renal manifestation (HCC)   Morbid obesity ([place]) DVT prophylaxis Diastolic congestive heart failure History of right breast cancer Anemia of chronic disease  Discharged Condition: Stable}  Significant Diagnostic Studies: CT ABDOMEN PELVIS WO CONTRAST  Result Date: 03/21/2019 CLINICAL DATA:  Unintentional weight loss, abdominal pain, nausea, vomiting EXAM: CT ABDOMEN AND PELVIS WITHOUT CONTRAST TECHNIQUE: Multidetector CT imaging of the abdomen and pelvis was performed following the standard protocol without IV contrast. COMPARISON:  08/01/2009 FINDINGS: Lower chest: Trace bilateral pleural effusions, right greater than left, with associated compressive atelectasis. Extensive postsurgical and posttreatment changes to the right breast including a 5.5 x 2.2 cm fluid collection adjacent to surgical clips which may represent postoperative seroma or hematoma versus an infected fluid collection (series 3, image 1). Hepatobiliary: No focal liver abnormality is seen. Status post cholecystectomy. No biliary dilatation. Pancreas: Unremarkable. No pancreatic ductal dilatation or surrounding inflammatory changes. Spleen: Normal in size without focal abnormality. Adrenals/Urinary Tract: Adrenal glands are unremarkable. Kidneys are normal, without renal calculi, focal lesion, or hydronephrosis. Bladder is unremarkable. Stomach/Bowel: Stomach is within normal limits. No pericecal or periappendiceal inflammatory changes. Scattered colonic diverticulosis. No evidence of bowel wall thickening, distention, or inflammatory changes. Vascular/Lymphatic: Aortic  atherosclerosis. No enlarged abdominal or pelvic lymph nodes. Reproductive: Uterus and bilateral adnexa are unremarkable. Other: No abdominopelvic ascites. Tiny fat containing umbilical hernia. Musculoskeletal: Degenerative disc disease of the lumbar spine most pronounced at L4-L5. No acute osseous findings. IMPRESSION: 1. No acute abdominopelvic findings. 2. Extensive postsurgical/posttreatment changes to the right breast including a 5.5 x 2.2 cm fluid collection adjacent to surgical clips which may represent postoperative seroma or hematoma. In the appropriate clinical setting, an infected fluid collection is also a consideration. Follow-up with outpatient diagnostic mammogram and possible ultrasound is recommended. 3. Trace bilateral pleural effusions, right greater than left, with associated compressive atelectasis. 4. Aortic atherosclerosis. Aortic Atherosclerosis (ICD10-I70.0). Electronically Signed   By: Davina Poke M.D.   On: 03/21/2019 11:08   CT Head Wo Contrast  Result Date: 03/18/2019 CLINICAL DATA:  Altered mental status, loss of consciousness EXAM: CT HEAD WITHOUT CONTRAST TECHNIQUE: Contiguous axial images were obtained from the base of the skull through the vertex without intravenous contrast. COMPARISON:  None. FINDINGS: Brain: Motion artifact is noted near the vertex which may limit detection of subtle abnormalities. No evidence of acute infarction, hemorrhage, hydrocephalus, extra-axial collection or mass lesion/mass effect. Symmetric prominence of the ventricles, cisterns and sulci compatible with parenchymal volume loss. Patchy areas of white matter hypoattenuation are most compatible with chronic microvascular angiopathy. Senescent mineralization of the basal ganglia. Vascular: Atherosclerotic calcification of the carotid siphons and intradural vertebral arteries. No hyperdense vessel. Skull: No calvarial fracture or suspicious osseous lesion. No scalp swelling or hematoma.  Sinuses/Orbits: Paranasal sinuses and mastoid air cells are predominantly clear. Included orbital structures are unremarkable. Other: None IMPRESSION: 1. Motion artifact is noted near the vertex which may limit detection of subtle abnormalities. 2. No acute intracranial abnormality. 3. Moderate parenchymal atrophy and chronic microvascular angiopathy. Electronically Signed   By: Lovena Le M.D.   On: 03/18/2019 00:48   MR BRAIN WO CONTRAST  Result Date: 03/18/2019 CLINICAL DATA:  Unexplained altered level  of consciousness EXAM: MRI HEAD WITHOUT CONTRAST TECHNIQUE: Multiplanar, multiecho pulse sequences of the brain and surrounding structures were obtained without intravenous contrast. COMPARISON:  Head CT from earlier today FINDINGS: Brain: No acute infarction, hemorrhage, hydrocephalus, extra-axial collection or mass lesion. Age normal brain volume and age congruent/mild small vessel ischemic type change in the cerebral white matter Vascular: Normal flow voids Skull and upper cervical spine: Negative for marrow lesion Sinuses/Orbits: Mild mucosal thickening in the left sphenoid sinus. No fluid levels within the sinuses. Negative orbits. Other: Intermittently significantly motion degraded study which could obscure pathology. IMPRESSION: 1. Motion degraded brain MRI without acute finding. 2. Age congruent senescent changes. Electronically Signed   By: Monte Fantasia M.D.   On: 03/18/2019 16:41   DG CHEST PORT 1 VIEW  Result Date: 03/24/2019 CLINICAL DATA:  Onset productive cough last night. EXAM: PORTABLE CHEST 1 VIEW COMPARISON:  Single-view of the chest 03/18/2019. CT chest 04/27/2018. FINDINGS: The lungs are clear. Heart size is normal. Aortic atherosclerosis noted. No pneumothorax or pleural effusion. Surgical clips are seen in the right axilla. IMPRESSION: No acute disease. Atherosclerosis. Electronically Signed   By: Inge Rise M.D.   On: 03/24/2019 13:36   DG Chest Port 1 View  Result  Date: 03/18/2019 CLINICAL DATA:  Shortness of breath EXAM: PORTABLE CHEST 1 VIEW COMPARISON:  Chest CT April 27, 2018 FINDINGS: There is no edema or consolidation. Heart is slightly enlarged with pulmonary vascularity normal. No adenopathy. There is aortic atherosclerosis. There are surgical clips in the right lateral breast/axillary region. IMPRESSION: No edema or consolidation. Stable cardiac prominence. Aortic Atherosclerosis (ICD10-I70.0). Electronically Signed   By: Lowella Grip III M.D.   On: 03/18/2019 08:06   ECHOCARDIOGRAM COMPLETE  Result Date: 03/18/2019   ECHOCARDIOGRAM REPORT   Patient Name:   LORELLE MACALUSO Avey Date of Exam: 03/18/2019 Medical Rec #:  [id number]               Height:       62.0 in Accession #:    [id number]              Weight:       247.0 lb Date of Birth:  Jan 31, 1933               BSA:          2.09 m Patient Age:    29 years                BP:           148/59 mmHg Patient Gender: F                       HR:           85 bpm. Exam Location:  Inpatient Procedure: 2D Echo, Cardiac Doppler and Color Doppler Indications:    R06.02 SOB  History:        Patient has no prior history of Echocardiogram examinations.                 Nausea and vomiting.  Sonographer:    Merrie Roof RDCS Referring Phys: [id number] [place]  1. Left ventricular ejection fraction, by visual estimation, is 60 to 65%. The left ventricle has normal function. There is mildly increased left ventricular hypertrophy.  2. Left ventricular diastolic parameters are consistent with Grade I diastolic dysfunction (impaired relaxation).  3. Global right ventricle has normal systolic function.The right ventricular size is  normal. No increase in right ventricular wall thickness.  4. Left atrial size was severely dilated.  5. Right atrial size was mildly dilated.  6. Mild mitral annular calcification.  7. The mitral valve is mildly calcified. Moderate mitral valve regurgitation.  8. The  tricuspid valve is normal in structure. Tricuspid valve regurgitation is trivial.  9. The aortic valve is normal in structure. Aortic valve regurgitation is trivial. 10. The pulmonic valve was normal in structure. Pulmonic valve regurgitation is trivial. 11. Moderately elevated pulmonary artery systolic pressure. 12. The inferior vena cava is normal in size with <50% respiratory variability, suggesting right atrial pressure of 8 mmHg. FINDINGS  Left Ventricle: Left ventricular ejection fraction, by visual estimation, is 60 to 65%. The left ventricle has normal function. There is mildly increased left ventricular hypertrophy. Left ventricular diastolic parameters are consistent with Grade I diastolic dysfunction (impaired relaxation). Right Ventricle: The right ventricular size is normal. No increase in right ventricular wall thickness. Global RV systolic function is has normal systolic function. The tricuspid regurgitant velocity is 3.39 m/s, and with an assumed right atrial pressure  of 10 mmHg, the estimated right ventricular systolic pressure is moderately elevated at 56.0 mmHg. Left Atrium: Left atrial size was severely dilated. Right Atrium: Right atrial size was mildly dilated Pericardium: There is no evidence of pericardial effusion. Mitral Valve: The mitral valve is normal in structure. There is mild calcification of the mitral valve leaflet(s). Mild mitral annular calcification. Moderate mitral valve regurgitation. Tricuspid Valve: The tricuspid valve is normal in structure. Tricuspid valve regurgitation is trivial. Aortic Valve: The aortic valve is normal in structure. Aortic valve regurgitation is trivial. Pulmonic Valve: The pulmonic valve was normal in structure. Pulmonic valve regurgitation is trivial. Aorta: The aortic root and ascending aorta are structurally normal, with no evidence of dilitation. Venous: The inferior vena cava is normal in size with less than 50% respiratory variability, suggesting  right atrial pressure of 8 mmHg. IAS/Shunts: No atrial level shunt detected by color flow Doppler.  LEFT VENTRICLE PLAX 2D LVIDd:         3.86 cm       Diastology LVIDs:         2.61 cm       LV e' lateral:   13.20 cm/s LV PW:         1.15 cm       LV E/e' lateral: 7.2 LV IVS:        1.24 cm       LV e' medial:    7.83 cm/s LVOT diam:     1.90 cm       LV E/e' medial:  12.2 LV SV:         39 ml LV SV Index:   17.30 LVOT Area:     2.84 cm  LV Volumes (MOD) LV area d, A2C:    21.50 cm LV area d, A4C:    25.30 cm LV area s, A2C:    12.20 cm LV area s, A4C:    12.30 cm LV major d, A2C:   5.93 cm LV major d, A4C:   7.61 cm LV major s, A2C:   4.44 cm LV major s, A4C:   5.21 cm LV vol d, MOD A2C: 67.4 ml LV vol d, MOD A4C: 71.1 ml LV vol s, MOD A2C: 28.8 ml LV vol s, MOD A4C: 23.9 ml LV SV MOD A2C:     38.6 ml LV SV MOD A4C:  71.1 ml LV SV MOD BP:      49.5 ml RIGHT VENTRICLE             IVC RV Basal diam:  3.20 cm     IVC diam: 2.00 cm RV S prime:     13.70 cm/s TAPSE (M-mode): 2.6 cm LEFT ATRIUM              Index       RIGHT ATRIUM           Index LA diam:        4.10 cm  1.96 cm/m  RA Area:     15.60 cm LA Vol (A2C):   163.0 ml 77.95 ml/m RA Volume:   40.10 ml  19.18 ml/m LA Vol (A4C):   88.1 ml  42.13 ml/m LA Biplane Vol: 121.0 ml 57.86 ml/m  AORTIC VALVE LVOT Vmax:   137.00 cm/s LVOT Vmean:  87.800 cm/s LVOT VTI:    0.304 m  AORTA Ao Root diam: 3.40 cm Ao Asc diam:  2.80 cm MITRAL VALVE                         TRICUSPID VALVE MV Area (PHT): 5.31 cm              TR Peak grad:   46.0 mmHg MV PHT:        41.47 msec            TR Vmax:        339.00 cm/s MV Decel Time: 143 msec MR PISA:        1.01 cm             SHUNTS MR PISA Radius: 0.40 cm              Systemic VTI:  0.30 m MV E velocity: 95.70 cm/s  103 cm/s  Systemic Diam: 1.90 cm MV A velocity: 123.00 cm/s 70.3 cm/s MV E/A ratio:  0.78        1.5  Glori Bickers MD Electronically signed by Glori Bickers MD Signature Date/Time:  03/18/2019/12:02:56 PM    Final     Labs:  Basic Metabolic Panel: Recent Labs  Lab 03/27/19 0540 03/28/19 0542 03/30/19 0516 03/31/19 0653 04/01/19 0544 04/02/19 0603  NA 131* 133* 133* 135 137 136  K 4.0 4.1 4.2 4.2 4.2 4.3  CL 98 98 99 101 103 102  CO2 24 26 24 24 25 25   GLUCOSE 115* 110* 107* 109* 108* 107*  BUN 25* 23 27* 26* 27* 24*  CREATININE 1.65* 1.52* 1.67* 1.72* 1.85* 1.83*  CALCIUM 9.7 9.6 9.7 10.0 9.8 9.6  PHOS  --   --   --  3.8 3.7 3.6    CBC: Recent Labs  Lab 03/27/19 0540 03/30/19 0516  WBC 3.6* 3.7*  NEUTROABS 1.7 1.5*  HGB 8.6* 8.9*  HCT 25.5* 26.9*  MCV 92.7 94.7  PLT 126* 126*    CBG: Recent Labs  Lab 04/01/19 2104 04/02/19 0554 04/02/19 1205 04/02/19 1633 04/02/19 2141  GLUCAP 138* 114* 106* 145* 115*   Family history.  Father with CAD, CVA, diabetes mellitus.  Sister with diabetes mellitus and CVA.  Brother with diabetes mellitus as well as liver cancer.  Negative for breast cancer or colon cancer  Brief HPI:   Stacy Walls is a 85 y.o. right-handed female with history of right breast cancer diagnosed December 2019 status post radiation maintained on  tamoxifen followed by oncology services.  Diastolic congestive heart failure, anemia of chronic disease, CKD stage III, hypertension, type 2 diabetes mellitus, depression and recent UTI.  Per chart review lives with grandson 1 level home.  Reportedly independent with ADLs.  Presented 03/18/2019 with generalized malaise, headache, nausea, decreased appetite.  Admission chemistries hemoglobin 10.5 sodium 124 creatinine 2.16 was latest of 1.683 weeks ago, troponin negative urinalysis negative nitrite, SARS coronavirus negative.  Cranial CT scan MRI negative for acute changes.  Chest x-ray showed no edema or consolidation.  CT abdomen pelvis showed no acute findings.  Extensive postsurgical posttreatment changes to the right breast that was reviewed by oncology services showed no evidence of  cancer recurrence.  Patient's HCTZ was held due to elevated creatinine and hyponatremia with renal services consulted.  Sodium slowly improve 129-131 latest creatinine 1.65.  Patient did remain on low-dose Lasix at the recommendations of renal services.  Subcutaneous heparin for DVT prophylaxis.  Latest hemoglobin 8.6 monitoring for any bleeding episodes.  Patient was admitted for a comprehensive rehab program   Hospital Course: Stacy Walls was admitted to rehab 03/27/2019 for inpatient therapies to consist of PT, ST and OT at least three hours five days a week. Past admission physiatrist, therapy team and rehab RN have worked together to provide customized collaborative inpatient rehab.  Pertaining to patient's debility related to chronic hyponatremia AKI with CKD stage III patient continued to progress in therapies.  Subcutaneous heparin for DVT prophylaxis as well as low-dose aspirin.  Pain managed with use of Voltaren 4 times daily.  Diastolic congestive heart failure no signs of fluid overload patient remained on low-dose Lasix.  AKI CKD stage III followed by Dr. Justin Mend fluid restriction as advised and latest creatinine 1.83.  Blood pressures remain controlled on hydralazine as well as Lopressor patient would follow-up with primary MD.  Blood sugars monitored hemoglobin A1c 6.3 sliding scale insulin.  History of right breast cancer continued on tamoxifen followed by oncology services.  Oncology was consulted for right breast feeling somewhat hard to touch likely due to radiation plan for outpatient mammogram.  Anemia of chronic disease continued iron supplement and latest hemoglobin 8.9.  Morbid obesity with BMI 42.18 dietary follow-up.  Patient did have bouts of constipation resolved with laxative assistance.   Blood pressures were monitored on TID basis and controlled  Diabetes has been monitored with ac/hs CBG checks and SSI was use prn for tighter BS control.   She has made gains during  rehab stay and is attending therapies  Stacy Walls continue to receive follow up therapies   after discharge  Rehab course: During patient's stay in rehab weekly team conferences were held to monitor patient's progress, set goals and discuss barriers to discharge. At admission, patient required minimal assist stand pivot transfers, minimal assist 12 feet rolling walker, supervision for rolling.  Set up upper body bathing minimal assist upper body dressing moderate assist lower body dressing stand pivot toilet transfers  Physical exam.  Blood pressure 170/78 pulse 63 temperature 98.4 respirations 17 oxygen saturations 99% room air Constitutional.  Alert and oriented HEENT oral mucosa pink and moist no exudate Cardiac regular rate rhythm without extra sounds or murmur Neck.  Supple nontender no JVD no thyromegaly Respiratory.  Clear to auscultation no wheezes or rails GI.  Soft nontender positive bowel sounds no rebound Neurological alert and oriented makes good eye contact provides her name and age limited medical historian.  Strength diffusely graded 4 out of 5  no focal deficits  He/She  has had improvement in activity tolerance, balance, postural control as well as ability to compensate for deficits.  She has had improvement in functional use RUE/LUE  and RLE/LLE as well as improvement in awareness.  Working with energy conservation techniques.  Supine to sit transfers with supervision assist.  Stand pivot transfers x2 throughout treatments with supervision assist.  Ambulates 50 feet 100 feet rolling walker supervision.  Gather his belongings for activities day living and homemaking.  Patient required minimal verbal cues to sequence steps to calling the kitchen to place her meal orders.  Full family teaching completed plan discharge to home       Disposition: Discharge disposition: 01-Home or Self Care     Discharge to home   Diet: Regular consistency 1500 mL fluid restriction  Special  Instructions: No driving smoking or alcohol  Follow-up outpatient oncology services for her mammogram  Medications at discharge. 1.  Tylenol as needed 2.  Aspirin 81 mg p.o. daily 3.  Lipitor 20 mg p.o. daily 4.  Voltaren gel 2 g 4 times a day to affected area 5.  Ferrous sulfate 325 mg daily 6.  Lasix 40 mg p.o. daily 7.  Hydralazine 10 mg p.o. 3 times daily 8.  Synthroid 112 mcg p.o. daily 9.  Lopressor 25 mg p.o. twice daily 10.  Protonix 40 mg p.o. daily 11.  Tamoxifen 20 mg p.o. daily 12.  Cytomel 5 mg daily 13.  Januvia 100 mg 1/2 tablet daily  Follow-up Information    Lovorn, Jinny Blossom, MD Follow up.   Specialty: Physical Medicine and Rehabilitation Why: Only as needed Contact information: [address] [place] [address] [phone]        Truitt Merle, MD Follow up.   Specialties: Hematology, Oncology Why: Call for appointment Contact information: [place] [address] [phone]        Edrick Oh, MD Follow up.   Specialty: Nephrology Why: Call for appointment Contact information: [place] [address] [phone]        Renato Shin, MD Follow up.   Specialty: Endocrinology Why: call for appointment Contact information: [address] [place] [address] [phone]           Signed: Lavon Paganini [place] 04/03/2019, 5:17 AM

## 2019-04-02 NOTE — Progress Notes (Signed)
Physical Therapy Discharge Summary  Patient Details  Name: Stacy Walls MRN: [id number] Date of Birth: 03-15-1933  Today's Date: 04/02/2019 PT Individual Time: 1000-1100 PT Individual Time Calculation (min): 60 min  Session focused on grad day activities to prepare for upcoming d/c tomorrow. Pt performed bed mobility on flat bed without assistance or cues, just extra time and increased effort. Pt request to use bathroom and performed functional transfers and short distance gait within room at modified independent level with RW. Pt able to perform hygiene in standing without assist. Simulated car transfer with supervision overall and cues for technique - pt able to manage BLE independently. simulated community mobility over ramp and mulched surface with RW with overall supervision as well as curb step negotiation with RW (also for home entry) ~ 20'. Pt limited in gait distances due to endurance, max of about 55' this session. Discussed and educated on energy conservation techniques. Pt reports she used to have a rollator. This may be beneficial in the future for community mobility. Performed mod I transfer to the recliner in room and all needs in reach.   Patient has met 9 of 11 long term goals due to improved activity tolerance, improved balance, improved postural control, increased strength and ability to compensate for deficits.  Patient to discharge at household ambulatory distance with RW level supervision to modified independent.   Patient's care partner is independent to provide the necessary supervision assistance at discharge intermittently.  Reasons goals not met: Pt did not meet distance for controlled environment gait goal due to low endurance as well as community mobility.  Recommendation:  Patient will benefit from ongoing skilled PT services in home health setting to continue to advance safe functional mobility, address ongoing impairments in endurance, strength, functional  mobility, higher level balance, and minimize fall risk.  Equipment: RW  Reasons for discharge: treatment goals met and discharge from hospital  Patient/family agrees with progress made and goals achieved: Yes  PT Discharge Precautions/Restrictions Precautions Precautions: Fall Restrictions Weight Bearing Restrictions: No  Pain 9/10 headache - RN aware and pain medication had been given.   Sensation Sensation Light Touch: Appears Intact Coordination Gross Motor Movements are Fluid and Coordinated: No Motor  Motor Motor: Other (comment) Motor - Discharge Observations: generalized weakness and low endurance  Mobility Bed Mobility Bed Mobility: Rolling Left;Supine to Sit;Sit to Supine Rolling Right: Independent Rolling Left: Independent Supine to Sit: Independent Sit to Supine: Independent Transfers Transfers: Sit to Stand;Stand to Sit;Stand Pivot Transfers Sit to Stand: Independent with assistive device Stand to Sit: Independent with assistive device Stand Pivot Transfers: Independent with assistive device Transfer (Assistive device): Rolling walker Locomotion  Gait Ambulation: Yes Gait Assistance: Independent with assistive device Gait Gait: Yes Gait Pattern: Impaired Gait Pattern: Decreased stride length;Step-through pattern;Decreased trunk rotation Stairs / Additional Locomotion Stairs: Yes Stairs Assistance: Supervision/Verbal cueing Stair Management Technique: Step to pattern;With walker Number of Stairs: 1 Height of Stairs: 6 Ramp: Supervision/Verbal cueing Curb: Supervision/Verbal cueing Wheelchair Mobility Wheelchair Mobility: No  Trunk/Postural Assessment  Cervical Assessment Cervical Assessment: Exceptions to WFL(forward head) Thoracic Assessment Thoracic Assessment: Exceptions to WFL(flexed posture) Lumbar Assessment Lumbar Assessment: Exceptions to WFL(decreased pelvic mobility) Postural Control Postural Control: Within Functional Limits   Balance Balance Balance Assessed: Yes Static Sitting Balance Static Sitting - Level of Assistance: 7: Independent Dynamic Sitting Balance Dynamic Sitting - Level of Assistance: 7: Independent Static Standing Balance Static Standing - Level of Assistance: 6: Modified independent (Device/Increase time)(with RW) Dynamic Standing Balance Dynamic Standing -  Level of Assistance: 6: Modified independent (Device/Increase time)(with RW) Extremity Assessment      RLE Assessment RLE Assessment: Exceptions to [facility] General Strength Comments: Grossly in sitting 4/5 throughout LLE Assessment LLE Assessment: Exceptions to [facility] General Strength Comments: Grossly in sitting 4/5 throughout    Canary Brim Ivory Broad, PT, DPT, CBIS  04/02/2019, 12:12 PM

## 2019-04-02 NOTE — Progress Notes (Addendum)
[place] PHYSICAL MEDICINE & REHABILITATION PROGRESS NOTE   Subjective/Complaints:  Still feels cool but feeling stronger! Excited to be going home tomorrow  ROS: Patient denies fever, rash, sore throat, blurred vision, nausea, vomiting, diarrhea, cough, shortness of breath or chest pain, joint or back pain, headache, or mood change.    Objective:   No results found. No results for input(s): WBC, HGB, HCT, PLT in the last 72 hours. Recent Labs    04/01/19 0544 04/02/19 0603  NA 137 136  K 4.2 4.3  CL 103 102  CO2 25 25  GLUCOSE 108* 107*  BUN 27* 24*  CREATININE 1.85* 1.83*  CALCIUM 9.8 9.6    Intake/Output Summary (Last 24 hours) at 04/02/2019 0910 Last data filed at 04/02/2019 0718 Gross per 24 hour  Intake 657 ml  Output 1 ml  Net 656 ml     Physical Exam: Vital Signs Blood pressure (!) 153/70, pulse 61, temperature 98.2 F (36.8 C), temperature source Oral, resp. rate 19, height 5\' 2"  (1.575 m), weight 104.6 kg, SpO2 99 %.   Physical Exam: Constitutional: No distress . Vital signs reviewed. HEENT: EOMI, oral membranes moist Neck: supple Cardiovascular: RRR without murmur. No JVD    Respiratory: CTA Bilaterally without wheezes or rales. Normal effort    GI: BS +, sl-tender where she has small hematoma, non-distended  Ext: no edema- wooding of skin on LEs seen Skin: intact Neuro: Patient is alert sitting up in bed. Ox3  Musculoskeletal: Strength diffusely 4/5; no focal deficits.  Psych: very pleasant  Assessment/Plan: 1. Functional deficits secondary to Debility due to AKI with acute on chronic renal impairment  which require 3+ hours per day of interdisciplinary therapy in a comprehensive inpatient rehab setting.  Physiatrist is providing close team supervision and 24 hour management of active medical problems listed below.  Physiatrist and rehab team continue to assess barriers to discharge/monitor patient progress toward functional and medical  goals  Care Tool:  Bathing    Body parts bathed by patient: Right arm, Left arm, Chest, Abdomen, Front perineal area, Buttocks, Right upper leg, Left upper leg, Face, Right lower leg, Left lower leg   Body parts bathed by helper: Right lower leg, Left lower leg     Bathing assist Assist Level: Supervision/Verbal cueing     Upper Body Dressing/Undressing Upper body dressing   What is the patient wearing?: Pull over shirt    Upper body assist Assist Level: Supervision/Verbal cueing    Lower Body Dressing/Undressing Lower body dressing      What is the patient wearing?: Skirt     Lower body assist Assist for lower body dressing: Supervision/Verbal cueing     Toileting Toileting    Toileting assist Assist for toileting: Supervision/Verbal cueing     Transfers Chair/bed transfer  Transfers assist     Chair/bed transfer assist level: Supervision/Verbal cueing Chair/bed transfer assistive device: Programmer, multimedia   Ambulation assist      Assist level: Supervision/Verbal cueing Assistive device: Walker-rolling Max distance: 50   Walk 10 feet activity   Assist  Walk 10 feet activity did not occur: Safety/medical concerns(unsafe without AD)  Assist level: Supervision/Verbal cueing Assistive device: Walker-rolling   Walk 50 feet activity   Assist Walk 50 feet with 2 turns activity did not occur: Safety/medical concerns(unsafe without AD)  Assist level: Supervision/Verbal cueing Assistive device: Walker-rolling    Walk 150 feet activity   Assist Walk 150 feet activity did not occur: Safety/medical  concerns(unsafe without AD)         Walk 10 feet on uneven surface  activity   Assist Walk 10 feet on uneven surfaces activity did not occur: Safety/medical concerns(unsafe without AD)   Assist level: Contact Guard/Touching assist Assistive device: Teacher, English as a foreign language activity did not  occur: Safety/medical concerns(Patient with bowel urgency during evaluation)  Wheelchair assist level: Minimal Assistance - Patient > 75% Max wheelchair distance: 100    Wheelchair 50 feet with 2 turns activity    Assist    Wheelchair 50 feet with 2 turns activity did not occur: Safety/medical concerns(Patient with bowel urgency during evaluation)   Assist Level: Minimal Assistance - Patient > 75%   Wheelchair 150 feet activity     Assist  Wheelchair 150 feet activity did not occur: Safety/medical concerns(Patient with bowel urgency during evaluation)       Blood pressure (!) 153/70, pulse 61, temperature 98.2 F (36.8 C), temperature source Oral, resp. rate 19, height 5\' 2"  (1.575 m), weight 104.6 kg, SpO2 99 %.  Medical Problem List and Plan: 1.  Debility secondary to acute on chronic hyponatremia/AKI with CKD stage III- nml outpt Cr 1.7-2.0 per nephrology             -patient may shower             -ELOS/Goals: ModI in PT, OT, I in SLP  -dc home 12/14 2.  Antithrombotics: -DVT/anticoagulation: Subcutaneous heparin--small hematoma from injection--explained to patient---can continue with injections              -antiplatelet therapy: Aspirin 81 mg daily 3. Pain Management: Voltaren gel 4 times daily 4. Mood: Provide emotional support             -antipsychotic agents: N/A 5. Neuropsych: This patient is capable of making decisions on her own behalf. 6. Skin/Wound Care: Routine skin checks 7. Fluids/Electrolytes/Nutrition: Routine in and outs with follow-up chemistries. Continue Ensure twice per day. 8.  Diastolic congestive heart failure.  Lasix 20 mg daily.  Monitor for any signs of fluid overload Filed Weights   03/31/19 0345 04/01/19 0608 04/02/19 0302  Weight: 105.6 kg 115.3 kg 104.6 kg   12/12 doubt today's weight is real  12/13 weight back in line today 9.  AKI with CKD stage III/hyponatremia. .  Continue fluid restriction as well as low-dose Lasix-    12/10-Cr was increased to 1.67 from 1.51- is mild increase however is on fluid restriction so can't really give more IVFs/PO- called renal just to touch base.  12/13- Cr 1.83, near baseline 10.  Hypertension.  Hydralazine 50 mg twice daily  12/13 borderline controlled 11.  Diabetes mellitus.  Hemoglobin A1c 6.3.  SSI.   CBG (last 3)  Recent Labs    04/01/19 1650 04/01/19 2104 04/02/19 0554  GLUCAP 123* 138* 114*    12/13- well controlled 12.  History of right breast cancer.  Continue tamoxifen.  Follow-up out patient oncology services  12/9- feeling like R breast is feeling more hard to touch- likely due to radiation- but will call oncologist -was told no new signs of CA. Also needs mammogram that was due while in hospital- needs to do after d/c.   12/11- Oncology says she will get U/S as of breast after d/c as well.  13.  Hypothyroidism.  Continue Synthroid/Cytomel  12/8- Feels cold as ice- most recent TSH >11-was 5.5 a few days prior, so could  be due to acute issues- then 7.98 1 month prior- L/M for Dr Renato Shin her Endo and hopefully will hear back and titrate her thyroid levels.   -heard back- will increase Synthroid to 112 mcg daily and d/c Cytomel per Dr Loanne Drilling.  -12/13- adjust supps as outpt 14.  Anemia of chronic disease.  Continue iron supplement 15.  Hyperlipidemia.  Lipitor 16.  GERD.  Protonix 17. Morbid obesity: Dietary follow-up. 18. Constipation  12/8- trying to go today- ordered Miralax, Sorbitol, and suppository prn and added Senokot S 2 tabs in AM to help her go- not on bowel meds.   12/9- was able to have 2 large BMs  12/10- still feeling constipated- added miralax AND increased senokot to 2 tabs BID- likely due to thyroid issues.  12/11- had 5 BMs lastnight- stopped scheduled miralax and made prn and reduced senokot a little   12/13 had large bm yesterday 18. Thrombocytopenia  12/8- Plts of 126- is stable currently  12/10- Plts 126k- pretty stable- s/p  radiation? 19. Disposition: Upon discharge, she should have follow-up with PCP, physiatry, and nephrology and Endocrinology- Dr Loanne Drilling.     LOS: 6 days A FACE TO FACE EVALUATION WAS PERFORMED  Meredith Staggers 04/02/2019, 9:10 AM

## 2019-04-02 NOTE — Progress Notes (Addendum)
Speech Language Pathology Discharge Summary  Patient Details  Name: Stacy Walls MRN: [id number] Date of Birth: 1932-07-05  Today's Date: 04/02/2019 SLP Individual Time: 87-1440 SLP Individual Time Calculation (min): 35 min   Skilled Therapeutic Interventions:  Pt was seen for skilled ST targeting cognitive goals.  Pt was received from OT after having just had a shower and was agreeable to participate in therapy.  Pt verbalized feeling very excited about going home tomorrow and was very pleased with everything she was able to accomplish in therapy today.  Pt recalled previous medication management tasks with SLP and was able to resume loading a twice daily pill box with only subtle supervision level cues for task organization and error awareness.  Pt is pleasantly verbose but was easily redirectable and her conversations with therapist did not interfere with her ability to complete task.  Pt's son arrived towards the end of today's therapy session and SLP reviewed and reinforced current goals and progress in therapy.  Pt and son both feel that pt is at or near her baseline for cognition and are very pleased with her current level of function.  SLP provided pt with a written handout of memory compensatory strategies as well as verbal review.  SLP also reviewed and reinforced the benefits of pt having at least initial assistance for medication management at discharge.  Both were in agreement with recommendations.  All questions were answered to their satisfaction at this time.  Pt is ready for discharge tomorrow.      Patient has met 2 of 5 long term goals.  Patient to discharge at overall Supervision level.  Reasons goals not met:     Clinical Impression/Discharge Summary:   Pt has made functional gains while inpatient and is discharging having met 2 out of 5 long term goals.  Pt is currently supervision for tasks and is at/near her baseline for cognition per both pt and her son.  Pt and  family education is complete at this time.  Pt has demonstrated improved emergent awareness and sustained attention to tasks.  Pt is discharging home with recommendations for intermittent supervision for medication and financial management and ST follow up at next level of care.    Care Partner:  Caregiver Able to Provide Assistance: Yes  Type of Caregiver Assistance: Physical;Cognitive  Recommendation:  Home Health SLP;Other (comment)(intermittent supervision)  Rationale for SLP Follow Up: Maximize cognitive function and independence   Equipment: none recommended by SLP   Reasons for discharge: Discharged from hospital   Patient/Family Agrees with Progress Made and Goals Achieved: Yes    Anaia Frith, Selinda Orion 04/02/2019, 4:28 PM

## 2019-04-02 NOTE — Progress Notes (Signed)
Occupational Therapy Session Note  Patient Details  Name: Stacy Walls MRN: [id number] Date of Birth: [birth year]  Today's Date: 04/02/2019 OT Individual Time: 1300-1405 OT Individual Time Calculation (min): 65 min    Short Term Goals: Week 1:  OT Short Term Goal 1 (Week 1): STG=LTG  Skilled Therapeutic Interventions/Progress Updates:    Completed ADL retraining at overall Mod I level.  Pt pleasantly verbose throughout session, but able to complete tasks with increased time.  Pt gathered all clothing with increased time and ambulated to toilet with RW.  Pt undressed at sit > stand level from toilet and then ambulated to room shower.  Pt states she just does not have the energy to shower at home, since her husband passed.  Pt states she most likely would not shower at home yet, unless someone was with her.  Completed all bathing at sit > stand level in room shower at Mod I level.  Pt dressed from EOB with increased time due to interruptions from nursing staff and pt verbosity.   Therapy Documentation Precautions:  Precautions Precautions: Fall Precaution Comments: Bowel incontinence Restrictions Weight Bearing Restrictions: No General:   Vital Signs: Therapy Vitals Temp: 98.7 F (37.1 C) Temp Source: Oral Pulse Rate: (!) 55 Resp: 18 BP: 138/73 Patient Position (if appropriate): Sitting Oxygen Therapy SpO2: 99 % O2 Device: Room Air Pain: Pain Assessment Pain Scale: 0-10 Pain Score: 0-No pain   Therapy/Group: Individual Therapy  Simonne Come 04/02/2019, 4:06 PM

## 2019-04-02 NOTE — Progress Notes (Signed)
Patient ID: Stacy Walls, female   DOB: 29-Apr-1932, 85 y.o.   MRN: [id number]  [facility] Progress Note   Assessment/ Plan:   1.  Chronic kidney disease stage III: Baseline creatinine ranging 1.7-2.0 per outpatient follow-up records from Dr. Justin Mend.  Stable renal function noted on labs overnight with continued subjective improvement of leg swelling along with concomitant weight loss (input/output unreliable).  Continue current dose of furosemide 40 mg daily with daily labs/daily weights. 2.  Hyponatremia: Suspected to have been volume mediated and improved with fluids and maintained with furosemide.  Sodium level currently within normal range with ongoing diuretic dose. 3.  Deconditioning following admission for metabolic encephalopathy/hyponatremia: Currently ongoing inpatient rehabilitation. 4.  Hypertension: Blood pressures elevated on metoprolol/hydralazine, will monitor another 24 hours to decide on need to uptitrate hydralazine to 25 mg twice a day.  Subjective:   Reports to continued improvement of leg swelling/tightness.  Denies any chest pain or shortness of breath overnight.   Objective:   BP (!) 153/70 (BP Location: Left Arm)   Pulse 61   Temp 98.2 F (36.8 C) (Oral)   Resp 19   Ht 5\' 2"  (1.575 m)   Wt 104.6 kg   SpO2 99%   BMI 42.18 kg/m   Intake/Output Summary (Last 24 hours) at 04/02/2019 0998 Last data filed at 04/02/2019 3382 Gross per 24 hour  Intake 657 ml  Output 1 ml  Net 656 ml   Weight change: -10.7 kg  Physical Exam: Gen: Comfortably resting flat in bed, watching television CVS: Pulse regular rhythm, normal rate, S1 and S2 normal Resp: Clear to auscultation, no rales/rhonchi Abd: Soft, obese, nontender Ext: Trace tender bilateral ankle edema with chronic right upper extremity edema.  Imaging: No results found.  Labs: BMET Recent Labs  Lab 03/27/19 0540 03/28/19 0542 03/30/19 0516 03/31/19 0653 04/01/19 0544  04/02/19 0603  NA 131* 133* 133* 135 137 136  K 4.0 4.1 4.2 4.2 4.2 4.3  CL 98 98 99 101 103 102  CO2 24 26 24 24 25 25   GLUCOSE 115* 110* 107* 109* 108* 107*  BUN 25* 23 27* 26* 27* 24*  CREATININE 1.65* 1.52* 1.67* 1.72* 1.85* 1.83*  CALCIUM 9.7 9.6 9.7 10.0 9.8 9.6  PHOS  --   --   --  3.8 3.7 3.6   CBC Recent Labs  Lab 03/27/19 0540 03/30/19 0516  WBC 3.6* 3.7*  NEUTROABS 1.7 1.5*  HGB 8.6* 8.9*  HCT 25.5* 26.9*  MCV 92.7 94.7  PLT 126* 126*    Medications:    . aspirin EC  81 mg Oral Daily  . atorvastatin  20 mg Oral Daily  . diclofenac sodium  2 g Topical QID  . feeding supplement (ENSURE ENLIVE)  237 mL Oral BID BM  . ferrous sulfate  325 mg Oral Daily  . fluticasone  2 spray Each Nare Daily  . furosemide  40 mg Oral Daily  . heparin  5,000 Units Subcutaneous Q8H  . hydrALAZINE  10 mg Oral TID  . insulin aspart  0-9 Units Subcutaneous TID WC  . levothyroxine  112 mcg Oral Q0600  . magic mouthwash  10 mL Oral TID  . metoprolol tartrate  25 mg Oral BID  . pantoprazole  40 mg Oral Daily  . senna-docusate  2 tablet Oral Daily  . tamoxifen  20 mg Oral Daily   [name], MD 04/02/2019, 9:18 AM

## 2019-04-03 ENCOUNTER — Other Ambulatory Visit: Payer: Medicare Other

## 2019-04-03 LAB — RENAL FUNCTION PANEL
Albumin: 3 g/dL — ABNORMAL LOW (ref 3.5–5.0)
Anion gap: 13 (ref 5–15)
BUN: 24 mg/dL — ABNORMAL HIGH (ref 8–23)
CO2: 19 mmol/L — ABNORMAL LOW (ref 22–32)
Calcium: 9.8 mg/dL (ref 8.9–10.3)
Chloride: 105 mmol/L (ref 98–111)
Creatinine, Ser: 1.77 mg/dL — ABNORMAL HIGH (ref 0.44–1.00)
GFR calc Af Amer: 30 mL/min — ABNORMAL LOW (ref >60)
GFR calc non Af Amer: 26 mL/min — ABNORMAL LOW (ref >60)
Glucose, Bld: 95 mg/dL (ref 70–99)
Phosphorus: 3.8 mg/dL (ref 2.5–4.6)
Potassium: 4.8 mmol/L (ref 3.5–5.1)
Sodium: 137 mmol/L (ref 135–145)

## 2019-04-03 LAB — GLUCOSE, CAPILLARY
Glucose-Capillary: 84 mg/dL (ref 70–99)
Glucose-Capillary: 108 mg/dL — ABNORMAL HIGH (ref 70–99)

## 2019-04-03 MED ORDER — SITAGLIPTIN PHOSPHATE 100 MG PO TABS: ORAL_TABLET | ORAL | 0 refills | Status: DC

## 2019-04-03 MED ORDER — FUROSEMIDE 40 MG PO TABS: 40.0000 mg | ORAL_TABLET | Freq: Every day | ORAL | 1 refills | Status: DC

## 2019-04-03 MED ORDER — HYDRALAZINE HCL 10 MG PO TABS: 10.0000 mg | ORAL_TABLET | Freq: Three times a day (TID) | ORAL | 0 refills | Status: DC

## 2019-04-03 MED ORDER — LEVOTHYROXINE SODIUM 112 MCG PO TABS: 112.0000 ug | ORAL_TABLET | Freq: Every day | ORAL | 1 refills | Status: DC

## 2019-04-03 MED ORDER — DICLOFENAC SODIUM 1 % TD GEL: TRANSDERMAL | 0 refills | Status: DC

## 2019-04-03 MED ORDER — ATORVASTATIN CALCIUM 20 MG PO TABS: 20.0000 mg | ORAL_TABLET | Freq: Every day | ORAL | 0 refills | Status: DC

## 2019-04-03 MED ORDER — METOPROLOL TARTRATE 25 MG PO TABS: 25.0000 mg | ORAL_TABLET | Freq: Two times a day (BID) | ORAL | 0 refills | Status: DC

## 2019-04-03 MED ORDER — ACETAMINOPHEN 325 MG PO TABS: 325.0000 mg | ORAL_TABLET | ORAL | Status: AC | PRN

## 2019-04-03 MED ORDER — LIOTHYRONINE SODIUM 5 MCG PO TABS: 5.0000 ug | ORAL_TABLET | Freq: Every day | ORAL | 0 refills | Status: DC

## 2019-04-03 MED ORDER — OMEPRAZOLE 20 MG PO CPDR: DELAYED_RELEASE_CAPSULE | ORAL | 0 refills | Status: DC

## 2019-04-03 MED ORDER — IRON 325 (65 FE) MG PO TABS: 325.0000 mg | ORAL_TABLET | Freq: Every day | ORAL | 0 refills | Status: AC

## 2019-04-03 MED ORDER — TAMOXIFEN CITRATE 20 MG PO TABS: ORAL_TABLET | ORAL | 3 refills | Status: DC

## 2019-04-03 NOTE — Progress Notes (Signed)
[place] PHYSICAL MEDICINE & REHABILITATION PROGRESS NOTE   Subjective/Complaints:  Still feels cold, but better- ready for d/c today- wants ot have BM before she goes- explained she can ask for miralax to go if needs to.  ROS: Patient denies fever, rash, sore throat, blurred vision, nausea, vomiting, diarrhea, cough, shortness of breath or chest pain, joint or back pain, headache, or mood change.    Objective:   No results found. No results for input(s): WBC, HGB, HCT, PLT in the last 72 hours. Recent Labs    04/02/19 0603 04/03/19 0615  NA 136 137  K 4.3 4.8  CL 102 105  CO2 25 19*  GLUCOSE 107* 95  BUN 24* 24*  CREATININE 1.83* 1.77*  CALCIUM 9.6 9.8    Intake/Output Summary (Last 24 hours) at 04/03/2019 0900 Last data filed at 04/03/2019 0811 Gross per 24 hour  Intake 737 ml  Output --  Net 737 ml     Physical Exam: Vital Signs Blood pressure (!) 141/58, pulse 60, temperature 98 F (36.7 C), temperature source Oral, resp. rate 18, height 5\' 2"  (1.575 m), weight 106.4 kg, SpO2 100 %.   Physical Exam: Constitutional: No distress . Vital signs reviewed. Sitting up in bed; excited about d/c, NAD HEENT: EOMI, oral membranes moist Neck: supple Cardiovascular: RRR without murmur. No JVD    Respiratory: CTA Bilaterally without wheezes or rales. Normal effort    GI: BS +, sl-tender where she has small hematoma, non-distended  Ext: no edema- wooding of skin on LEs seen Skin: intact Neuro: Patient is alert sitting up in bed. Ox3  Musculoskeletal: Strength diffusely 4/5; no focal deficits.  Psych: very pleasant  Assessment/Plan: 1. Functional deficits secondary to Debility due to AKI with acute on chronic renal impairment  which require 3+ hours per day of interdisciplinary therapy in a comprehensive inpatient rehab setting.  Physiatrist is providing close team supervision and 24 hour management of active medical problems listed below.  Physiatrist and rehab  team continue to assess barriers to discharge/monitor patient progress toward functional and medical goals  Care Tool:  Bathing    Body parts bathed by patient: Right arm, Left arm, Chest, Abdomen, Front perineal area, Buttocks, Right upper leg, Left upper leg, Face, Right lower leg, Left lower leg   Body parts bathed by helper: Right lower leg, Left lower leg     Bathing assist Assist Level: Independent with assistive device     Upper Body Dressing/Undressing Upper body dressing   What is the patient wearing?: Pull over shirt    Upper body assist Assist Level: Independent with assistive device    Lower Body Dressing/Undressing Lower body dressing      What is the patient wearing?: Pants     Lower body assist Assist for lower body dressing: Independent with assitive device     Toileting Toileting    Toileting assist Assist for toileting: Independent with assistive device     Transfers Chair/bed transfer  Transfers assist     Chair/bed transfer assist level: Independent with assistive device Chair/bed transfer assistive device: Armrests, Programmer, multimedia   Ambulation assist      Assist level: Supervision/Verbal cueing Assistive device: Walker-rolling Max distance: 55'   Walk 10 feet activity   Assist  Walk 10 feet activity did not occur: Safety/medical concerns(unsafe without AD)  Assist level: Independent with assistive device Assistive device: Walker-rolling   Walk 50 feet activity   Assist Walk 50 feet with 2  turns activity did not occur: Safety/medical concerns(unsafe without AD)  Assist level: Independent with assistive device Assistive device: Walker-rolling    Walk 150 feet activity   Assist Walk 150 feet activity did not occur: Safety/medical concerns(endurance)         Walk 10 feet on uneven surface  activity   Assist Walk 10 feet on uneven surfaces activity did not occur: Safety/medical concerns(unsafe  without AD)   Assist level: Supervision/Verbal cueing Assistive device: Aeronautical engineer Will patient use wheelchair at discharge?: No   Wheelchair activity did not occur: Safety/medical concerns(Patient with bowel urgency during evaluation)  Wheelchair assist level: Dependent - Patient 0% Max wheelchair distance: 100    Wheelchair 50 feet with 2 turns activity    Assist    Wheelchair 50 feet with 2 turns activity did not occur: Safety/medical concerns(Patient with bowel urgency during evaluation)   Assist Level: Dependent - Patient 0%   Wheelchair 150 feet activity     Assist  Wheelchair 150 feet activity did not occur: Safety/medical concerns(Patient with bowel urgency during evaluation)   Assist Level: Dependent - Patient 0%   Blood pressure (!) 141/58, pulse 60, temperature 98 F (36.7 C), temperature source Oral, resp. rate 18, height 5\' 2"  (1.575 m), weight 106.4 kg, SpO2 100 %.  Medical Problem List and Plan: 1.  Debility secondary to acute on chronic hyponatremia/AKI with CKD stage III- nml outpt Cr 1.7-2.0 per nephrology             -patient may shower             -ELOS/Goals: ModI in PT, OT, I in SLP  -dc home 12/14 2.  Antithrombotics: -DVT/anticoagulation: Subcutaneous heparin--small hematoma from injection--explained to patient---can continue with injections              -antiplatelet therapy: Aspirin 81 mg daily 3. Pain Management: Voltaren gel 4 times daily 4. Mood: Provide emotional support             -antipsychotic agents: N/A 5. Neuropsych: This patient is capable of making decisions on her own behalf. 6. Skin/Wound Care: Routine skin checks 7. Fluids/Electrolytes/Nutrition: Routine in and outs with follow-up chemistries. Continue Ensure twice per day. 8.  Diastolic congestive heart failure.  Lasix 20 mg daily.  Monitor for any signs of fluid overload Filed Weights   04/01/19 0608 04/02/19 0302 04/03/19 0315   Weight: 115.3 kg 104.6 kg 106.4 kg   12/12 doubt today's weight is real  12/13 weight back in line today 9.  AKI with CKD stage III/hyponatremia. .  Continue fluid restriction as well as low-dose Lasix-   12/10-Cr was increased to 1.67 from 1.51- is mild increase however is on fluid restriction so can't really give more IVFs/PO- called renal just to touch base.  12/13- Cr 1.83, near baseline 10.  Hypertension.  Hydralazine 50 mg twice daily  12/13 borderline controlled 11.  Diabetes mellitus.  Hemoglobin A1c 6.3.  SSI.   CBG (last 3)  Recent Labs    04/02/19 1633 04/02/19 2141 04/03/19 0632  GLUCAP 145* 115* 84    12/14- well controlled 12.  History of right breast cancer.  Continue tamoxifen.  Follow-up out patient oncology services  12/9- feeling like R breast is feeling more hard to touch- likely due to radiation- but will call oncologist -was told no new signs of CA. Also needs mammogram that was due while in hospital- needs to do after d/c.  12/11- Oncology says she will get U/S as of breast after d/c as well.  13.  Hypothyroidism.  Continue Synthroid/Cytomel  12/8- Feels cold as ice- most recent TSH >11-was 5.5 a few days prior, so could be due to acute issues- then 7.98 1 month prior- L/M for Dr Renato Shin her Endo and hopefully will hear back and titrate her thyroid levels.   -heard back- will increase Synthroid to 112 mcg daily and d/c Cytomel per Dr Loanne Drilling.  -12/13- adjust supps as outpt 14.  Anemia of chronic disease.  Continue iron supplement 15.  Hyperlipidemia.  Lipitor 16.  GERD.  Protonix 17. Morbid obesity: Dietary follow-up. 18. Constipation  12/8- trying to go today- ordered Miralax, Sorbitol, and suppository prn and added Senokot S 2 tabs in AM to help her go- not on bowel meds.   12/9- was able to have 2 large BMs  12/10- still feeling constipated- added miralax AND increased senokot to 2 tabs BID- likely due to thyroid issues.  12/11- had 5 BMs lastnight-  stopped scheduled miralax and made prn and reduced senokot a little   12/13 had large bm yesterday  12/14- still feels like needs to have another BM today. 18. Thrombocytopenia  12/8- Plts of 126- is stable currently  12/10- Plts 126k- pretty stable- s/p radiation? 19. Disposition: Upon discharge, she should have follow-up with PCP, physiatry, and nephrology and Endocrinology- Dr Loanne Drilling.    12/14- d/c today  LOS: 7 days A FACE TO FACE EVALUATION WAS PERFORMED  Stacy Walls 04/03/2019, 9:00 AM

## 2019-04-03 NOTE — Progress Notes (Signed)
Patient wearing her own clothing and shoes. Patient discharging with her son. Provider discussed discharge instructions with both the patient and her son. Patient leaving with all her belongings packed. No questions/concerns per patient.

## 2019-04-03 NOTE — Progress Notes (Signed)
Social Work Discharge Note   The overall goal for the admission was met for:   Discharge location: Yes - home with intermittent support of son  Length of Stay: Yes - 7 days  Discharge activity level: Yes - modified independent/ supervision  Home/community participation: Yes  Services provided included: MD, RD, PT, OT, SLP, RN, Pharmacy and [place]: [facility] Medicare  Follow-up services arranged: Home Health: PT, OT via [facility], DME: rolling walker via [place] and Patient/Family has no preference for HH/DME agencies  Comments (or additional information):  Contact info:  Pt's son, Dr. Cecille Rubin @  Patient/Family verbalized understanding of follow-up arrangements: Yes  Individual responsible for coordination of the follow-up plan: pt  Confirmed correct DME delivered: Rakeisha Nyce 04/03/2019    Twylia Oka

## 2019-04-03 NOTE — Plan of Care (Signed)
  Problem: Consults Goal: RH GENERAL PATIENT EDUCATION Description: See Patient Education module for education specifics. Outcome: Completed/Met Goal: Nutrition Consult-if indicated Outcome: Completed/Met Goal: Diabetes Guidelines if Diabetic/Glucose > 140 Description: If diabetic or lab glucose is > 140 mg/dl - Initiate Diabetes/Hyperglycemia Guidelines & Document Interventions  Outcome: Completed/Met   Problem: RH BOWEL ELIMINATION Goal: RH STG MANAGE BOWEL WITH ASSISTANCE Description: STG Manage Bowel with min Assistance. Outcome: Completed/Met Goal: RH STG MANAGE BOWEL W/MEDICATION W/ASSISTANCE Description: STG Manage Bowel with Medication with mod I Assistance. Outcome: Completed/Met   Problem: RH BLADDER ELIMINATION Goal: RH STG MANAGE BLADDER WITH ASSISTANCE Description: STG Manage Bladder With min Assistance Outcome: Completed/Met   Problem: RH SKIN INTEGRITY Goal: RH STG SKIN FREE OF INFECTION/BREAKDOWN Description: Pt will remain free of skin breakdown with min assist Outcome: Completed/Met   Problem: RH SAFETY Goal: RH STG ADHERE TO SAFETY PRECAUTIONS W/ASSISTANCE/DEVICE Description: STG Adhere to Safety Precautions With supervision Assistance/Device. Outcome: Completed/Met Goal: RH STG DECREASED RISK OF FALL WITH ASSISTANCE Description: STG Decreased Risk of Fall With reminders/cues  Assistance. Outcome: Completed/Met   Problem: RH KNOWLEDGE DEFICIT GENERAL Goal: RH STG INCREASE KNOWLEDGE OF SELF CARE AFTER HOSPITALIZATION Description: Pt and family will be able to demonstrate safety and fall precautions for home and compliance and understanding of medications using handouts and teaching education.  Outcome: Completed/Met

## 2019-04-06 ENCOUNTER — Telehealth: Payer: Self-pay | Admitting: *Deleted

## 2019-04-06 NOTE — Telephone Encounter (Signed)
Erin PT called to verify MD signing [place] orders and request 2wk2, 1wk4.  I have given approval and Lovorn wih be responsible MD.

## 2019-04-08 ENCOUNTER — Other Ambulatory Visit: Payer: Self-pay | Admitting: Nurse Practitioner

## 2019-05-05 DIAGNOSIS — I5032 Chronic diastolic (congestive) heart failure: Secondary | ICD-10-CM

## 2019-05-05 DIAGNOSIS — I130 Hypertensive heart and chronic kidney disease with heart failure and stage 1 through stage 4 chronic kidney disease, or unspecified chronic kidney disease: Secondary | ICD-10-CM

## 2019-05-05 DIAGNOSIS — D631 Anemia in chronic kidney disease: Secondary | ICD-10-CM

## 2019-05-05 DIAGNOSIS — E1122 Type 2 diabetes mellitus with diabetic chronic kidney disease: Secondary | ICD-10-CM

## 2019-05-05 DIAGNOSIS — I700 Atherosclerosis of aorta: Secondary | ICD-10-CM

## 2019-05-05 DIAGNOSIS — E039 Hypothyroidism, unspecified: Secondary | ICD-10-CM

## 2019-05-05 DIAGNOSIS — E871 Hypo-osmolality and hyponatremia: Secondary | ICD-10-CM

## 2019-05-05 DIAGNOSIS — Z6830 Body mass index (BMI) 30.0-30.9, adult: Secondary | ICD-10-CM

## 2019-05-05 DIAGNOSIS — Z853 Personal history of malignant neoplasm of breast: Secondary | ICD-10-CM

## 2019-05-05 DIAGNOSIS — N390 Urinary tract infection, site not specified: Secondary | ICD-10-CM | POA: Diagnosis not present

## 2019-05-05 DIAGNOSIS — Z7982 Long term (current) use of aspirin: Secondary | ICD-10-CM

## 2019-05-05 DIAGNOSIS — N183 Chronic kidney disease, stage 3 unspecified: Secondary | ICD-10-CM

## 2019-05-05 DIAGNOSIS — G9341 Metabolic encephalopathy: Secondary | ICD-10-CM

## 2019-05-05 DIAGNOSIS — I679 Cerebrovascular disease, unspecified: Secondary | ICD-10-CM

## 2019-05-05 DIAGNOSIS — Z9181 History of falling: Secondary | ICD-10-CM

## 2019-05-05 DIAGNOSIS — F329 Major depressive disorder, single episode, unspecified: Secondary | ICD-10-CM

## 2019-05-05 DIAGNOSIS — I088 Other rheumatic multiple valve diseases: Secondary | ICD-10-CM

## 2019-05-11 ENCOUNTER — Telehealth: Payer: Self-pay

## 2019-05-11 NOTE — Telephone Encounter (Signed)
  Stacy Walls ordered mammogram in 11/2018, she was supposed to do in 03/2019 but has not done it. She is overdue, please let her call [facility] to schedule her mammogram ASAP. Thanks  Truitt Merle MD

## 2019-05-11 NOTE — Telephone Encounter (Signed)
Patient calls wanting to ask Dr. Burr Medico if she will still be able to get a mammogram since her breast swelling is not gone completely down.   [phone]

## 2019-05-12 ENCOUNTER — Telehealth: Payer: Self-pay

## 2019-05-12 NOTE — Telephone Encounter (Signed)
Spoke with patient regarding getting a mammogram.  Per Dr. Burr Medico okay to go ahead and proceed with this.  She was given the phone number for [facility] and she will call and get this scheduled.

## 2019-05-16 NOTE — Progress Notes (Signed)
Cardiology Office Note:    Date:  05/17/2019   ID:  Stacy Walls, DOB 1933/03/03, MRN [id number]  PCP:  Sonia Side., FNP  Cardiologist:  Candee Furbish, MD  Electrophysiologist:  None  Nephrologist: Dr. Justin Mend  Referring MD: Sonia Side., FNP   Chief Complaint  Patient presents with  . Congestive Heart Failure    History of Present Illness:    Stacy Walls is a 86 y.o. female with:   Presumed coronary artery disease   Myoview in 2007: inf ischemia >> no symptoms; CKD >> med Rx  Coronary artery Ca2+ on CT in May 21, 2018  Aortic Atherosclerosis  CT 2018/05/21  Diabetes mellitus   Hypertension   Hyperlipidemia   Chronic kidney disease   Breast CA s/p partial mastectomy in 05/2018; Radiation; Tamoxifen  Stacy Walls was last seen in December 2019 for surgical clearance prior to mastectomy for breast cancer.  She was admitted to [facility] in 02/2019 with weakness, mental status changes and HA in the setting of significant hyponatremia and AKI on CKD.  She had recently been tx with Levaquin for UTI.  She had a prolonged QT on ECG.  Her HCTZ was stopped and her QT and Na improved.  She was significantly weakened and was DC to inpatient rehab.    She was seen in follow up by her PCP and it was requested that she follow up with Cardiology.  She we received records from her PCP office that were independently reviewed by me.  Notes indicate she needed Cardiology follow up due to uncontrolled BP and her BNP was elevated.    [name] notes that she is slowly getting stronger and is working with [place].  She notes an episode of chest pain about 4 weeks ago.  This was brief and not related to exertion.  She has been short of breath with exertion, but this is improving.  Her leg swelling has also been improving. She sleeps on an incline chronically without change. She has not had paroxysmal nocturnal dyspnea.  She has not had syncope.    Prior CV studies:    The following studies were reviewed today:  Echocardiogram 03/18/2019 EF 60-65, mild LVH, Gr 1 DD, normal RVSF, severe LAE, mild RAE, mild MAC, mod MR, trivial TR, trivial AI, trivial PI, mod elevated PASP   Past Medical History:  Diagnosis Date  . Acute cholecystitis   . Allergic rhinitis   . Anemia   . Arthritis   . Cancer [facility])    Right breast  . Chest pain   . Chronic renal insufficiency    followed by Dr Justin Mend stage 3  . Clostridium difficile colitis   . Constipation   . Coronary atherosclerosis of native coronary vessel   . Cough   . Depression    situational - husband died 05-21-18  . Diabetes mellitus   . Dizziness   . DM2 (diabetes mellitus, type 2) ([place])   . Dyshidrosis   . Family history of adverse reaction to anesthesia    daughter - nausea  . GERD (gastroesophageal reflux disease)   . Gout   . HLD (hyperlipidemia)   . HTN (hypertension)   . Hypothyroidism   . Neck mass   . Neck pain   . Osteoporosis   . Routine general medical examination at a health care facility   . Secondary hyperparathyroidism (of renal origin)   . Urinary incontinence   . Urinary tract infection  Surgical Hx: The patient  has a past surgical history that includes C-section (other) (1977); L ankle surgery (Left, 1988); electrocardiogram (02/01/06); bone density (08/21/05); Cholecystectomy; total left knee (Left, 2007); and Breast lumpectomy with radioactive seed and axillary lymph node dissection (Right, 06/07/2018).   Current Medications: Current Meds  Medication Sig  . acetaminophen (TYLENOL) 325 MG tablet Take 1-2 tablets (325-650 mg total) by mouth every 4 (four) hours as needed for mild pain.  Marland Kitchen allopurinol (ZYLOPRIM) 100 MG tablet Take 100 mg by mouth daily.  Marland Kitchen aspirin EC 81 MG tablet Take 81 mg by mouth daily.  Marland Kitchen atorvastatin (LIPITOR) 20 MG tablet Take 1 tablet (20 mg total) by mouth daily.  . Calcium Carb-Cholecalciferol (CALCIUM 500 +D PO) Take 1 tablet by mouth daily.  .  cetirizine (ZYRTEC) 10 MG tablet Take 10 mg by mouth daily.   . diclofenac sodium (VOLTAREN) 1 % GEL APPLY 2 GRAMS EXTERNALLY TO THE AFFECTED AREA FOUR TIMES DAILY  . Ferrous Sulfate (IRON) 325 (65 Fe) MG TABS Take 1 tablet (325 mg total) by mouth daily.  . furosemide (LASIX) 40 MG tablet Take 1.5 tablets (60 mg total) by mouth daily.  . hydrALAZINE (APRESOLINE) 25 MG tablet Take 1 tablet (25 mg total) by mouth 3 (three) times daily.  Marland Kitchen levothyroxine (SYNTHROID) 112 MCG tablet Take 1 tablet (112 mcg total) by mouth daily at 6 (six) AM.  . liothyronine (CYTOMEL) 5 MCG tablet Take 1 tablet (5 mcg total) by mouth daily.  . metoprolol tartrate (LOPRESSOR) 25 MG tablet Take 1 tablet (25 mg total) by mouth 2 (two) times daily.  . Multiple Vitamins-Minerals (MULTIVITAMIN WOMEN 50+ PO) Take 1 tablet by mouth daily.   Marland Kitchen omeprazole (PRILOSEC) 20 MG capsule TAKE 1 CAPSULE(20 MG) BY MOUTH DAILY  . Polyethyl Glycol-Propyl Glycol (SYSTANE) 0.4-0.3 % SOLN Place 1 drop into both eyes daily as needed (for dry eyes).  . polyethylene glycol (MIRALAX / GLYCOLAX) 17 g packet Take 17 g by mouth as needed.  . tamoxifen (NOLVADEX) 20 MG tablet TAKE 1 TABLET(20 MG) BY MOUTH DAILY  . VITAMIN D PO Take 1 tablet by mouth daily.  . [DISCONTINUED] furosemide (LASIX) 40 MG tablet Take 1 tablet (40 mg total) by mouth daily.  . [DISCONTINUED] hydrALAZINE (APRESOLINE) 10 MG tablet Take 1 tablet (10 mg total) by mouth 3 (three) times daily.     Allergies:   Ace inhibitors, Azithromycin, Pioglitazone, Sulfonamide derivatives, and Tramadol   Social History   Tobacco Use  . Smoking status: Never Smoker  . Smokeless tobacco: Never Used  Substance Use Topics  . Alcohol use: No  . Drug use: No     Family Hx: The patient's family history includes Cancer in her brother; Coronary artery disease in her father; Diabetes in her brother, father, and sister; Stroke in her father and sister. There is no history of Breast  cancer.  ROS:   Please see the history of present illness.    Review of Systems  Constitution: Negative for fever.  Gastrointestinal: Negative for hematochezia and melena.  Genitourinary: Negative for hematuria.   All other systems reviewed and are negative.   EKGs/Labs/Other Test Reviewed:    EKG:  EKG is not ordered today.   The ekg done in the hospital from 03/18/2019 was independently reviewed by me and demonstrates: Normal sinus rhythm, HR 75, normal axis, no ST-TW changes, QTc 437  Recent Labs: 03/18/2019: Magnesium 1.7 03/20/2019: B Natriuretic Peptide 73.6; TSH 11.724 03/28/2019: ALT 27  03/30/2019: Hemoglobin 8.9; Platelets 126 04/03/2019: BUN 24; Creatinine, Ser 1.77; Potassium 4.8; Sodium 137   Recent Lipid Panel Lab Results  Component Value Date/Time   CHOL 260 (H) 09/07/2017 11:30 AM   TRIG 274.0 (H) 09/07/2017 11:30 AM   HDL 39.70 09/07/2017 11:30 AM   CHOLHDL 7 09/07/2017 11:30 AM   LDLCALC 86 04/01/2015 12:00 PM   LDLDIRECT 159.0 09/07/2017 11:30 AM   Outside Notes/Labs reviewed: Office visit note from Dustin Folks, Brooke Bonito, NP from 05/04/2019 ([place] [place], [place]) - Independently reviewed  Labs from Mr. Smith's office drawn (05/04/2019) independently reviewed by me: Magnesium 1.4 Creatinine 1.28, potassium 3.7, sodium 135, ALT 14, hemoglobin 10.4, TSH 1.72 BNP 527  Physical Exam:    VS:  BP (!) 150/70   Pulse 70   Ht _0  (1.575 m)   Wt 232 lb 12.8 oz (105.6 kg)   SpO2 97%   BMI 42.58 kg/m     Wt Readings from Last 3 Encounters:  05/17/19 232 lb 12.8 oz (105.6 kg)  04/03/19 234 lb 9.1 oz (106.4 kg)  03/27/19 234 lb 11.2 oz (106.5 kg)     Physical Exam  Constitutional: She is oriented to person, place, and time. She appears well-developed and well-nourished. No distress.  HENT:  Head: Normocephalic and atraumatic.  Eyes: No scleral icterus.  Neck: No JVD (at 90 degrees) present.  Cardiovascular: Normal rate and regular  rhythm.  No murmur heard. Pulmonary/Chest: Effort normal. She has no rales.  Abdominal: Soft.  Musculoskeletal:        General: Edema (1+ bilat ankle edema) present.  Neurological: She is alert and oriented to person, place, and time.  Skin: Skin is warm and dry.    ASSESSMENT & PLAN:    1. Chronic diastolic CHF (congestive heart failure) (HCC) Echocardiogram in 02/2019 demonstrated EF 60-65 with mild diastolic dysfunction and mod elevated PASP.  She is NYHA IIb.  Recent labs drawn by her PCP were independently reviewed by me and revealed an elevated BNP at 527, improved Creatinine at 1.28, normal potassium at 3.7.  Her exam is notable for clear lungs without rales and no JVD.  However, she still has some evidence of volume excess with leg swelling.  Her chronic kidney disease will make managing volume more problematic.  With her hx of hyponatremia, we will avoid using HCTZ again.  I think we should have her on a higher dose of loop diuretic to maintain her volume.  -Increase Furosemide to 60 mg once daily   -BMET, BNP 1 week  -FU in 4-6 weeks with Dr. Marlou Porch  2. Pulmonary hypertension, unspecified ([place]) I suspect her pulmonary hypertension is related to L sided HF and age.  No further testing is planned for now.    3. Stage 3b chronic kidney disease She had worsening Creatinine in the hospital in 02/2019.  But this has improved recently to 1.28.  She sees Dr. Justin Mend in early Feb.   4. Hypomagnesemia Recent labs from her PCP demonstrates a Mg of 1.4  Start MgOx 400 mg once daily and repeat Mg level in 1 week.   5. Coronary artery disease involving native coronary artery of native heart without angina pectoris Coronary Ca+ on prior CT.  Myoview in 2007 with inferior ischemia.  She was managed medically in 2007 due to lack of symptoms and chronic kidney disease.  She did have chest pain several weeks ago that was atypical for ischemia.  She has not  had exertional chest pain.  At this point, I  do not think she requires further testing.  If she has more episodes of chest pain, we will need to consider proceeding with a nuclear stress test to rule out significant ischemia.    6. Essential hypertension BP is above goal (goal is < 130/80).  -Continue current dose of Furosemide, Metoprolol Tartrate  -Increase Hydralazine 25 mg three times a day    -She can take 2 of the 10 mg tabs three times a day until they are gone   -Consider Amlodipine or Isosorbide if BP does not improve on this regimen.    Dispo:  Return in about 6 weeks (around 06/28/2019) for Routine Follow Up w/ Dr. Marlou Porch.   Medication Adjustments/Labs and Tests Ordered: Current medicines are reviewed at length with the patient today.  Concerns regarding medicines are outlined above.  Tests Ordered: Orders Placed This Encounter  Procedures  . Basic metabolic panel  . Pro b natriuretic peptide (BNP)  . Magnesium   Medication Changes: Meds ordered this encounter  Medications  . furosemide (LASIX) 40 MG tablet    Sig: Take 1.5 tablets (60 mg total) by mouth daily.    Dispense:  45 tablet    Refill:  6  . hydrALAZINE (APRESOLINE) 25 MG tablet    Sig: Take 1 tablet (25 mg total) by mouth 3 (three) times daily.    Dispense:  90 tablet    Refill:  6  . Magnesium Oxide 400 MG CAPS    Sig: Take 1 capsule (400 mg total) by mouth daily.    Dispense:  30 capsule    Refill:  6    Signed, [name], PA-C  05/17/2019 12:36 PM    [facility] [address], [place], [place]  [address] Phone: [phone]; Fax: [phone]

## 2019-05-17 ENCOUNTER — Other Ambulatory Visit: Payer: Self-pay

## 2019-05-17 ENCOUNTER — Ambulatory Visit (INDEPENDENT_AMBULATORY_CARE_PROVIDER_SITE_OTHER): Payer: Medicare Other | Admitting: Physician Assistant

## 2019-05-17 ENCOUNTER — Encounter: Payer: Self-pay | Admitting: Physician Assistant

## 2019-05-17 VITALS — BP 150/70 | HR 70 | Ht 62.0 in | Wt 232.8 lb

## 2019-05-17 DIAGNOSIS — I10 Essential (primary) hypertension: Secondary | ICD-10-CM

## 2019-05-17 DIAGNOSIS — N1832 Chronic kidney disease, stage 3b: Secondary | ICD-10-CM

## 2019-05-17 DIAGNOSIS — I272 Pulmonary hypertension, unspecified: Secondary | ICD-10-CM

## 2019-05-17 DIAGNOSIS — I2510 Atherosclerotic heart disease of native coronary artery without angina pectoris: Secondary | ICD-10-CM

## 2019-05-17 DIAGNOSIS — I5032 Chronic diastolic (congestive) heart failure: Secondary | ICD-10-CM | POA: Diagnosis not present

## 2019-05-17 MED ORDER — MAGNESIUM OXIDE 400 MG PO CAPS: 1.0000 | ORAL_CAPSULE | Freq: Every day | ORAL | 6 refills | Status: DC

## 2019-05-17 MED ORDER — FUROSEMIDE 40 MG PO TABS: 60.0000 mg | ORAL_TABLET | Freq: Every day | ORAL | 6 refills | Status: DC

## 2019-05-17 MED ORDER — HYDRALAZINE HCL 25 MG PO TABS: 25.0000 mg | ORAL_TABLET | Freq: Three times a day (TID) | ORAL | 6 refills | Status: DC

## 2019-05-17 NOTE — Patient Instructions (Addendum)
Medication Instructions:   Your physician has recommended you make the following change in your medication:   1) Increase your Lasix to 60 mg (1.5 tablets) by mouth once a day  2) Start Magnesium Oxide 400 mg, 1 tablet by mouth once a day  3) Increase your Hydralazine to 25 mg, 1 tablet by mouth three times a day; ( you can take 2 of your 10 mg tablets by mouth three times a day until you run out).  *If you need a refill on your cardiac medications before your next appointment, please call your pharmacy*  Lab Work:  Your physician recommends that you return for lab work on 05/25/19 at 10:15AM  If you have labs (blood work) drawn today and your tests are completely normal, you will receive your results only by: Marland Kitchen MyChart Message (if you have MyChart) OR . A paper copy in the mail If you have any lab test that is abnormal or we need to change your treatment, we will call you to review the results.  Testing/Procedures:  None ordered today  Follow-Up: At [facility], you and your health needs are our priority.  As part of our continuing mission to provide you with exceptional heart care, we have created designated Provider Care Teams.  These Care Teams include your primary Cardiologist (physician) and Advanced Practice Providers (APPs -  Physician Assistants and Nurse Practitioners) who all work together to provide you with the care you need, when you need it.  Your next appointment:    On 06/20/19 at 10:20AM with Candee Furbish, MD

## 2019-05-24 ENCOUNTER — Other Ambulatory Visit: Payer: Self-pay

## 2019-05-24 ENCOUNTER — Ambulatory Visit
Admission: RE | Admit: 2019-05-24 | Discharge: 2019-05-24 | Disposition: A | Discharge status: Home / Self Care | Payer: Medicare Other | Source: Ambulatory Visit | Attending: Nurse Practitioner | Admitting: Nurse Practitioner

## 2019-05-24 DIAGNOSIS — C50111 Malignant neoplasm of central portion of right female breast: Secondary | ICD-10-CM

## 2019-05-24 DIAGNOSIS — Z170 Estrogen receptor positive status [ER+]: Secondary | ICD-10-CM

## 2019-05-25 ENCOUNTER — Other Ambulatory Visit: Payer: Medicare Other | Admitting: *Deleted

## 2019-05-25 DIAGNOSIS — I2510 Atherosclerotic heart disease of native coronary artery without angina pectoris: Secondary | ICD-10-CM

## 2019-05-25 DIAGNOSIS — N1832 Chronic kidney disease, stage 3b: Secondary | ICD-10-CM

## 2019-05-25 DIAGNOSIS — I10 Essential (primary) hypertension: Secondary | ICD-10-CM

## 2019-05-26 ENCOUNTER — Telehealth: Payer: Self-pay

## 2019-05-26 DIAGNOSIS — N1832 Chronic kidney disease, stage 3b: Secondary | ICD-10-CM

## 2019-05-26 DIAGNOSIS — I5032 Chronic diastolic (congestive) heart failure: Secondary | ICD-10-CM

## 2019-05-26 LAB — BASIC METABOLIC PANEL
BUN/Creatinine Ratio: 12 (ref 12–28)
BUN: 18 mg/dL (ref 8–27)
CO2: 23 mmol/L (ref 20–29)
Calcium: 9.7 mg/dL (ref 8.7–10.3)
Chloride: 98 mmol/L (ref 96–106)
Creatinine, Ser: 1.49 mg/dL — ABNORMAL HIGH (ref 0.57–1.00)
GFR calc Af Amer: 37 mL/min/{1.73_m2} — ABNORMAL LOW (ref >59)
GFR calc non Af Amer: 32 mL/min/{1.73_m2} — ABNORMAL LOW (ref >59)
Glucose: 182 mg/dL — ABNORMAL HIGH (ref 65–99)
Potassium: 4.1 mmol/L (ref 3.5–5.2)
Sodium: 134 mmol/L (ref 134–144)

## 2019-05-26 LAB — PRO B NATRIURETIC PEPTIDE: NT-Pro BNP: 3487 pg/mL — ABNORMAL HIGH (ref 0–738)

## 2019-05-26 LAB — MAGNESIUM: Magnesium: 1.4 mg/dL — ABNORMAL LOW (ref 1.6–2.3)

## 2019-05-26 NOTE — Telephone Encounter (Signed)
-----   Message from Liliane Shi, PA-C sent at 05/26/2019  9:14 AM EST ----- Creatinine stable.  Glucose high.  K+ normal.  BNP elevated.  Magnesium low. She needs further diuresis.  PLAN:   - Ask patient how she is feeling - is her breathing any better?  - Increase Furosemide to 40 mg twice daily   - Increase MgOx to 400 mg twice daily   - F/u with PCP for diabetes mellitus   - BMET, Mg2+ level in 2 weeks [name], PA-C    05/26/2019 9:10 AM

## 2019-05-26 NOTE — Telephone Encounter (Signed)
The patient has been notified of the result and verbalized understanding.  All questions (if any) were answered. Patient will increase Furosemide to 40 mg, 1 tablet twice a day and Magnesium to 400 mg, 1 tablet twice a day. Copy sent to PCP. BMET and Magnesium repeat scheduled for 06/09/19. Patient states that she is feeling a lot better and her breathing has gotten better as well.  Mady Haagensen, [place] 05/26/2019 11:21 AM

## 2019-05-31 ENCOUNTER — Other Ambulatory Visit: Payer: Self-pay

## 2019-05-31 ENCOUNTER — Inpatient Hospital Stay: Payer: Medicare Other | Attending: Nurse Practitioner | Admitting: Nurse Practitioner

## 2019-05-31 ENCOUNTER — Encounter: Payer: Self-pay | Admitting: Nurse Practitioner

## 2019-05-31 VITALS — BP 149/66 | HR 69 | Temp 98.3°F | Resp 17 | Ht 62.0 in | Wt 227.1 lb

## 2019-05-31 DIAGNOSIS — Z170 Estrogen receptor positive status [ER+]: Secondary | ICD-10-CM

## 2019-05-31 DIAGNOSIS — Z79899 Other long term (current) drug therapy: Secondary | ICD-10-CM | POA: Diagnosis not present

## 2019-05-31 DIAGNOSIS — E119 Type 2 diabetes mellitus without complications: Secondary | ICD-10-CM | POA: Diagnosis not present

## 2019-05-31 DIAGNOSIS — Z79810 Long term (current) use of selective estrogen receptor modulators (SERMs): Secondary | ICD-10-CM | POA: Insufficient documentation

## 2019-05-31 DIAGNOSIS — N189 Chronic kidney disease, unspecified: Secondary | ICD-10-CM | POA: Diagnosis not present

## 2019-05-31 DIAGNOSIS — Z923 Personal history of irradiation: Secondary | ICD-10-CM | POA: Diagnosis not present

## 2019-05-31 DIAGNOSIS — C773 Secondary and unspecified malignant neoplasm of axilla and upper limb lymph nodes: Secondary | ICD-10-CM | POA: Diagnosis present

## 2019-05-31 DIAGNOSIS — Z833 Family history of diabetes mellitus: Secondary | ICD-10-CM | POA: Insufficient documentation

## 2019-05-31 DIAGNOSIS — Z791 Long term (current) use of non-steroidal anti-inflammatories (NSAID): Secondary | ICD-10-CM | POA: Diagnosis not present

## 2019-05-31 DIAGNOSIS — Z8249 Family history of ischemic heart disease and other diseases of the circulatory system: Secondary | ICD-10-CM | POA: Insufficient documentation

## 2019-05-31 DIAGNOSIS — C50111 Malignant neoplasm of central portion of right female breast: Secondary | ICD-10-CM | POA: Diagnosis not present

## 2019-05-31 DIAGNOSIS — Z800 Family history of malignant neoplasm of digestive organs: Secondary | ICD-10-CM | POA: Diagnosis not present

## 2019-05-31 DIAGNOSIS — E785 Hyperlipidemia, unspecified: Secondary | ICD-10-CM | POA: Insufficient documentation

## 2019-05-31 DIAGNOSIS — I10 Essential (primary) hypertension: Secondary | ICD-10-CM | POA: Diagnosis not present

## 2019-05-31 DIAGNOSIS — E039 Hypothyroidism, unspecified: Secondary | ICD-10-CM | POA: Diagnosis not present

## 2019-05-31 DIAGNOSIS — Z7982 Long term (current) use of aspirin: Secondary | ICD-10-CM | POA: Insufficient documentation

## 2019-05-31 NOTE — Progress Notes (Signed)
CLINIC:  Survivorship   REASON FOR VISIT:  Routine follow-up post-treatment for a recent history of breast cancer.  BRIEF ONCOLOGIC HISTORY:  Oncology History Overview Note  Cancer Staging Cancer of central portion of right female breast [facility]) Staging form: Breast, AJCC 8th Edition - Clinical stage from 03/30/2018: Stage IB (cT1c(m), cN1, cM0, G2, ER+, PR+, HER2-) - Signed by Stacy Merle, MD on 04/08/2018 - Pathologic stage from 06/07/2018: Stage IB (pT2(m), pN2a, cM0, G2, ER+, PR+, HER2-) - Signed by Stacy Phlegm, NP on 06/22/2018     Cancer of central portion of right female breast ([place])  01/13/2018 Mammogram   Diagnostic Mammogram 01/13/18  RECOMMENDATION: 1. Ultrasound-guided biopsies of both masses in the RIGHT breast identified by ultrasound at the 12 o'clock axis (7 cm from the nipple measuring 1.1 x 0.8 x 0.7 cm and 5 cm from the nipple measuring 1 x 0.8 x 0.9 cm respectively). 2. Ultrasound-guided biopsy of the most prominent lymph node in the RIGHT axilla. 3. Postprocedure mammogram to ensure that the masses correspond to the extent of the asymmetry/distortion seen on mammogram. Additional stereotactic biopsy may be needed if the clips do not approximate the anterior and posterior extents. 4. Depending on the location of the post biopsy clips, and if breast conservation surgery is considered, stereotactic biopsy may be also needed for the coarse heterogeneous calcifications within the slightly outer RIGHT breast, measuring 8 mm extent, located 1.2 cm from the asymmetry/distortion.   03/30/2018 Cancer Staging   Staging form: Breast, AJCC 8th Edition - Clinical stage from 03/30/2018: Stage IB (cT1c(m), cN1, cM0, G2, ER+, PR+, HER2-) - Signed by Stacy Merle, MD on 04/08/2018   03/30/2018 Initial Biopsy   Diagnosis 03/30/18 1. Breast, right, needle core biopsy, 12 o'clock position, 5cm from nipple - INVASIVE LOBULAR CARCINOMA, GRADE 2. SEE NOTE. - LOBULAR  CARCINOMA IN SITU, INTERMEDIATE NUCLEAR GRADE. 2. Breast, right, needle core biopsy, 12 o'clock position 7cfn - INVASIVE LOBULAR CARCINOMA, GRADE 2. SEE NOTE. 1 of 3 FINAL for Stacy Walls ([id number]) Diagnosis(continued) - LOBULAR CARCINOMA IN SITU, INTERMEDIATE NUCLEAR GRADE. 3. Lymph node, needle/core biopsy, right axilla - METASTATIC LOBULAR CARCINOMA TO LYMPH NODE.   03/30/2018 Receptors her2   Results: IMMUNOHISTOCHEMICAL AND MORPHOMETRIC ANALYSIS PERFORMED MANUALLY The tumor cells are NEGATIVE for Her2 (1+). Estrogen Receptor: 100%, POSITIVE, STRONG STAINING INTENSITY Progesterone Receptor: 70%, POSITIVE, STRONG STAINING INTENSITY Proliferation Marker Ki67: 10%   04/08/2018 Initial Diagnosis   Cancer of central portion of right female breast ([place])   04/27/2018 Imaging   CT CAP W Contrast 04/27/18  IMPRESSION: 1. Right axillary and right subpectoral lymphadenopathy, suspicious for metastatic disease. PET-CT may prove helpful to further evaluate. 2. Cholelithiasis. 3. Colonic diverticulosis without diverticulitis. 4.  Aortic Atherosclerois (ICD10-170.0)   04/28/2018 Imaging   Bone Scan 04/28/18  IMPRESSION: No scintigraphic evidence skeletal metastasis. Severe degenerative uptake in the medial compartment RIGHT knee.   06/07/2018 Cancer Staging   Staging form: Breast, AJCC 8th Edition - Pathologic stage from 06/07/2018: Stage IB (pT2(m), pN2a, cM0, G2, ER+, PR+, HER2-) - Signed by Stacy Phlegm, NP on 06/22/2018   06/07/2018 Surgery   RIGHT BREAST LUMPECTOMY WITH BRACKETED RADIOACTIVE SEEDS AND RIGHT COMPLETE AXILLARY LYMPH NODE DISSECTION by Dr. Dalbert Walls 06/07/18    06/07/2018 Pathology Results   Diagnosis 06/07/18 1. Breast, lumpectomy, right with radioactive seeds - INVASIVE LOBULAR CARCINOMA, MULTIFOCAL, LARGER FOCUS IS 3 CM, NOTTINGHAM GRADE 2 OF 3. - MARGINS OF RESECTION ARE NOT INVOLVED (CLOSEST MARGIN: LESS  THAN 1 MM, ANTERIOR). - BIOPSY SITE  CHANGES. - SEE ONCOLOGY TABLE. 2. Lymph nodes, regional resection, right axillary contents - METASTATIC CARCINOMA PRESENT IN SEVEN OF FOURTEEN LYMPH NODES (7/14). 3. Breast, excision, inferior margin - BREAST PARENCHYMA, NEGATIVE FOR CARCINOMA.   06/2018 -  Anti-estrogen oral therapy   Tamoxifen 18m daily starting in March or April 2020.    10/13/2018 - 11/25/2018 Radiation Therapy   Adjuvant radiation to right breat per Dr. SIsidore Walls  05/31/2019 Survivorship   SCP delivered by LCira Rue NP      INTERVAL HISTORY:  [facility] to review her survivorship care plan detailing her treatment course for breast cancer, as well as monitoring long-term side effects of that treatment, education regarding health maintenance, screening, and overall wellness and health promotion.     Overall, Stacy Walls reports feeling well since discharge from hospital rehab. She lives at home, able to bathe/toilet and do light house work independently. A family member visits often and can help when needed. Stacy Walls her right breast felt hard previously but that resolved. She completed PT and also OT. No new breast concerns such as nipple inversion or discharge. She continues tamoxifen. Denies significant hot flash, bone pain, or mood swing. She misses her late husband. Denies any bleeding. Denies leg swelling or pain. Her breathing was "not up to par" recently but meds were adjusted per cardiology and dyspnea resolved, no cough or chest pain. Denies recent fever or infection, change in bowel habits, or other new concerns.     ONCOLOGY TREATMENT TEAM:  1. Surgeon[facility]  [facility]  2. Medical Oncologist: Dr. FBurr Medico3. Radiation Oncologist: Dr. SIsidore Walls   PAST MEDICAL/SURGICAL HISTORY:  Past Medical History:  Diagnosis Date  . Acute cholecystitis   . Allergic rhinitis   . Anemia   . Arthritis   . Cancer ([facility]     Right breast  . Chest pain   . Chronic renal insufficiency    followed by Dr Stacy Mendstage 3  . Clostridium difficile colitis   . Constipation   . Coronary atherosclerosis of native coronary vessel   . Cough   . Depression    situational - husband died 12020-02-07 . Diabetes mellitus   . Dizziness   . DM2 (di[place], [place] 2[place]   . Dyshidrosis   . Family history of adverse reaction to anesthesia    daughter - nausea  . GERD (gastroesophageal reflux disease)   . Gout   . HLD (hyperlipidemia)   . HTN (hypertension)   . Hypothyroidism   . Neck mass   . Neck pain   . Osteoporosis   . Personal history of radiation therapy 12/2018  . Routine general medical examination at a health care facility   . Secondary hyperparathyroidism (of renal origin)   . Urinary incontinence   . Urinary tract infection    Past Surgical History:  Procedure Laterality Date  . bone density  08/21/05  . BREAST BIOPSY Right 03/30/2018   x3  . BREAST BIOPSY Right 04/25/2018  . BREAST LUMPECTOMY Right 06/07/2018  . BREAST LUMPECTOMY WITH RADIOACTIVE SEED AND AXILLARY LYMPH NODE DISSECTION Right 06/07/2018   Procedure: RIGHT BREAST LUMPECTOMY WITH BRACKETED RADIOACTIVE SEEDS AND RIGHT COMPLETE AXILLARY LYMPH NODE DISSECTION;  Surgeon: IFanny Skates MD;  [facility]: [facility]  Service: General;  Laterality: Right;  . C-section (other)  1977  . CHOLECYSTECTOMY    .  ELECTROCARDIOGRAM  02/01/06  . L ankle surgery Left 1988  . total left knee Left 2007     ALLERGIES:  Allergies  Allergen Reactions  . Ace Inhibitors Cough  . Azithromycin Other (See Comments)    Unknown  . Pioglitazone Other (See Comments)    REACTION: edema  . Sulfonamide Derivatives Rash  . Tramadol Other (See Comments)    dizziness     CURRENT MEDICATIONS:  Outpatient Encounter Medications as of 05/31/2019  Medication Sig  . acetaminophen (TYLENOL) 325 MG tablet Take 1-2 tablets (325-650 mg total) by mouth every 4 (four)  hours as needed for mild pain.  Marland Kitchen allopurinol (ZYLOPRIM) 100 MG tablet Take 100 mg by mouth daily.  Marland Kitchen aspirin EC 81 MG tablet Take 81 mg by mouth daily.  Marland Kitchen atorvastatin (LIPITOR) 20 MG tablet Take 1 tablet (20 mg total) by mouth daily.  . Calcium Carb-Cholecalciferol (CALCIUM 500 +D PO) Take 1 tablet by mouth daily.  . cetirizine (ZYRTEC) 10 MG tablet Take 10 mg by mouth daily.   . diclofenac sodium (VOLTAREN) 1 % GEL APPLY 2 GRAMS EXTERNALLY TO THE AFFECTED AREA FOUR TIMES DAILY  . Ferrous Sulfate (IRON) 325 (65 Fe) MG TABS Take 1 tablet (325 mg total) by mouth daily.  . furosemide (LASIX) 40 MG tablet Take 1.5 tablets (60 mg total) by mouth daily.  . hydrALAZINE (APRESOLINE) 25 MG tablet Take 1 tablet (25 mg total) by mouth 3 (three) times daily.  Marland Kitchen levothyroxine (SYNTHROID) 112 MCG tablet Take 1 tablet (112 mcg total) by mouth daily at 6 (six) AM.  . liothyronine (CYTOMEL) 5 MCG tablet Take 1 tablet (5 mcg total) by mouth daily.  . Magnesium Oxide 400 MG CAPS Take 1 capsule (400 mg total) by mouth daily.  . metoprolol tartrate (LOPRESSOR) 25 MG tablet Take 1 tablet (25 mg total) by mouth 2 (two) times daily.  . Multiple Vitamins-Minerals (MULTIVITAMIN WOMEN 50+ PO) Take 1 tablet by mouth daily.   Marland Kitchen omeprazole (PRILOSEC) 20 MG capsule TAKE 1 CAPSULE(20 MG) BY MOUTH DAILY  . Polyethyl Glycol-Propyl Glycol (SYSTANE) 0.4-0.3 % SOLN Place 1 drop into both eyes daily as needed (for dry eyes).  . polyethylene glycol (MIRALAX / GLYCOLAX) 17 g packet Take 17 g by mouth as needed.  . tamoxifen (NOLVADEX) 20 MG tablet TAKE 1 TABLET(20 MG) BY MOUTH DAILY  . VITAMIN D PO Take 1 tablet by mouth daily.   No facility-administered encounter medications on file as of 05/31/2019.     ONCOLOGIC FAMILY HISTORY:  Family History  Problem Relation Age of Onset  . Coronary artery disease Father   . Stroke Father   . Diabetes Father   . Diabetes Sister   . Stroke Sister   . Diabetes Brother   . Cancer  Brother        liver caner  . Breast cancer Neg Hx      GENETIC COUNSELING/TESTING: None  SOCIAL HISTORY:  Lilliane Lilygrace Rodick is widowed alone in [place], [place].  She denies any current or history of tobacco, alcohol, or illicit drug use.     PHYSICAL EXAMINATION:  Vital Signs:   Vitals:   05/31/19 1014  BP: (!) 149/66  Pulse: 69  Resp: 17  Temp: 98.3 F (36.8 C)  SpO2: 98%   Filed Weights   05/31/19 1014  Weight: 227 lb 1.6 oz (103 kg)   General: alert, comfortable female in no acute distress, presents in wheelchair.   Sclerae anicteric.  Lymph: No cervical, supraclavicular, or infraclavicular lymphadenopathy noted on palpation.  Cardiovascular: Regular rate and rhythm. Respiratory: Clear  bilaterally.breathing non-labored.  GI/GU: Deferred.   Psych: Mood and affect normal and appropriate for situation.  Extremities: No edema. MSK: presents in wheelchair Skin: Warm and dry. Breast exam: s/p right lumpectomy and radiation. Incisions healed. Mild hyperpigmentation and breast edema. No palpable mass in either breast or adenopathy that I could appreciate.   LABORATORY DATA:  None for this visit.  DIAGNOSTIC IMAGING:  Recent mammogram 05/24/19 reviewed     ASSESSMENT AND PLAN:  Ms.. Walls is a pleasant 86 y.o. female with Stage IB right breast invasive lobular carcinoma, ER+/PR+/HER2-, diagnosed in 03/2018, treated with lumpectomy, adjuvant radiation therapy, and anti-estrogen therapy with Tamoxifen beginning in 06/2018 (before surgery due to [place] delays).  She presents to the Survivorship Clinic for our initial meeting and routine follow-up post-completion of treatment for breast cancer.    1. Stage IB right breast cancer:  Stacy Walls has recovered well from definitive treatment for breast cancer. She will follow-up with her medical oncologist, Dr. Burr Walls in 09/2019 with history and physical exam per surveillance protocol.  She will continue her  anti-estrogen therapy with Tamoxifen which she is tolerating well without significant side effects. She was instructed to make Dr. Burr Walls or myself aware if she begins to experience any worsening side effects of the medication and I could see her back in clinic to help manage those side effects, as needed. Though the incidence is low, there is an associated risk of endometrial cancer with anti-estrogen therapies like Tamoxifen.  Stacy Walls was encouraged to contact Dr. Burr Walls or myself with any vaginal bleeding while taking Tamoxifen. Other side effects of Tamoxifen were again reviewed with her as well. Today's breast exam shows radiation changes, otherwise benign. no clinical concern for recurrence. Her recent mammogram shows a 5 cm seroma which is not palpable nor causing her pain. Will monitor. I discussed if it becomes painful I will refer her to surgery to have it drained. Her surgeon retired, will refer her anyways to establish with new provider.   Today, a comprehensive survivorship care plan and treatment summary was reviewed with the patient today detailing her breast cancer diagnosis, treatment course, potential late/long-term effects of treatment, appropriate follow-up care with recommendations for the future, and patient education resources.  A copy of this summary, along with a letter will be sent to the patient's primary care provider via In Basket message after today's visit.    2. Bone health:  Given Stacy Walls's age/history of breast cancer and history of osteoporosis on DEXA in 11/2018, she should repeat DEXA in 11/2020. We reviewed the bone strengthening benefit of Tamoxifen. She was encouraged to continue calcium and vitamin D supplement and engage in weight bearing exercise. She was given education on specific activities to promote bone health.  3. Cancer screening:  Due to Stacy Walls's history and her age, she should use shared decision making with her medical care teams regarding the  use of routine cancer screening tests. She has never had a colonoscopy, and these are not usually done in her age. She knows to notify us if she develops changes in bowel function, abdominal pain, or rectal bleeding. I discussed there are other less invasive tests to check for blood in the stool if needed. We also discussed risk of endometrial cancer on tamoxifen and she knows to call us if she develops any vaginal bleeding. receive screening for skin cancers,  colon cancer, and gynecologic cancers.  The information and recommendations are listed on the patient's comprehensive care plan/treatment summary and were reviewed in detail with the patient.    4. Health maintenance and wellness promotion: Stacy Walls was encouraged to consume 5-7 servings of fruits and vegetables per day. We reviewed the "Nutrition Rainbow" handout, as well as the handout "Take Control of Your Health and Reduce Your Cancer Risk" from the [employer].  She was also encouraged to engage in exercise for 30 minutes per day most days of the week at a level that she can safely tolerate. I would not suggest starting with moderate or intensive exercise given her age and other co-morbidities. We discussed the [employer] fitness program, which is designed for cancer survivors to help them become more physically fit after cancer treatments.  She was instructed to limit her alcohol consumption and continue to abstain from tobacco use.     5. Support services/counseling: It is not uncommon for this period of the patient's cancer care trajectory to be one of many emotions and stressors.  We discussed an opportunity for her to participate in the next session of [facility] ("Finding Your New Normal") support group series designed for patients after they have completed treatment.   Stacy Walls was encouraged to take advantage of our many other support services programs, support groups, and/or counseling in coping with her new life as a  cancer survivor after completing anti-cancer treatment.  She was offered support today through active listening and expressive supportive counseling.  She was given information regarding our available services and encouraged to contact me with any questions or for help enrolling in any of our support group/programs.    Dispo:   -Return to cancer center 09/2019  -Mammogram due in 05/2020  -Continue tamoxifen  -Follow up with surgery as indicated, I will refer you to a new surgeon  -F/u with radiation as indicated  -She is welcome to return back to the Survivorship Clinic at any time; no additional follow-up needed at this time.  -Consider referral back to survivorship as a long-term survivor for continued surveillance  A total of (45) minutes of face-to-face time was spent with this patient with greater than 50% of that time in counseling and care-coordination.  Cira Rue, NP Survivorship Program [facility] [phone]   Note: PRIMARY CARE PROVIDER [address] [place] [phone]

## 2019-06-07 ENCOUNTER — Telehealth: Payer: Self-pay | Admitting: Nurse Practitioner

## 2019-06-07 NOTE — Telephone Encounter (Signed)
I talk with patient regarding reschedule to june

## 2019-06-08 ENCOUNTER — Telehealth: Payer: Self-pay | Admitting: Cardiology

## 2019-06-08 NOTE — Telephone Encounter (Signed)
Medications increase per last lab work, you wanted her to come in for repeat labs correct?

## 2019-06-08 NOTE — Telephone Encounter (Signed)
New Message    Pt is wondering if she is needing to come for her labs  On the 02/19 because she had the labs on 02/04    Please call

## 2019-06-09 ENCOUNTER — Other Ambulatory Visit: Payer: Medicare Other | Admitting: *Deleted

## 2019-06-09 ENCOUNTER — Other Ambulatory Visit: Payer: Self-pay

## 2019-06-09 ENCOUNTER — Encounter (INDEPENDENT_AMBULATORY_CARE_PROVIDER_SITE_OTHER): Payer: Self-pay

## 2019-06-09 DIAGNOSIS — I5032 Chronic diastolic (congestive) heart failure: Secondary | ICD-10-CM

## 2019-06-09 DIAGNOSIS — N1832 Chronic kidney disease, stage 3b: Secondary | ICD-10-CM

## 2019-06-09 LAB — BASIC METABOLIC PANEL
BUN/Creatinine Ratio: 17 (ref 12–28)
BUN: 26 mg/dL (ref 8–27)
CO2: 22 mmol/L (ref 20–29)
Calcium: 10.3 mg/dL (ref 8.7–10.3)
Chloride: 99 mmol/L (ref 96–106)
Creatinine, Ser: 1.56 mg/dL — ABNORMAL HIGH (ref 0.57–1.00)
GFR calc Af Amer: 35 mL/min/{1.73_m2} — ABNORMAL LOW (ref >59)
GFR calc non Af Amer: 30 mL/min/{1.73_m2} — ABNORMAL LOW (ref >59)
Glucose: 185 mg/dL — ABNORMAL HIGH (ref 65–99)
Potassium: 4 mmol/L (ref 3.5–5.2)
Sodium: 134 mmol/L (ref 134–144)

## 2019-06-09 LAB — MAGNESIUM: Magnesium: 1.5 mg/dL — ABNORMAL LOW (ref 1.6–2.3)

## 2019-06-09 NOTE — Telephone Encounter (Signed)
Yes.  Furosemide was increased on 05/25/2019 labs.  So, need repeat labs to recheck renal fxn, K+, Mg2+. [name], PA-C    06/09/2019 8:15 AM

## 2019-06-09 NOTE — Telephone Encounter (Signed)
I called and left patient a message advising her to keep appointment today for labs. Patient did come in today and had the repeat labs drawn.

## 2019-06-15 IMAGING — US ULTRASOUND RIGHT BREAST LIMITED
1 series · 12 of 22 positions shown · non-contrast
Comparison: Screening mammogram dated 12/28/2017.

ADDENDUM:
Call received from patient with questions regarding the mammogram
and ultrasound findings from her appointment on 01/13/18. The patient
did request to cancel her upcoming biopsy on 02/02/18 as she was
unable to feel any areas of concern today. The patient was informed
again that this area is highly suspicious for breast cancer and that
she should keep her biopsy appointment. She conveyed understanding.

As reported by Kaki Jim, RN on 01/13/2018.
CLINICAL DATA: Patient returns today to evaluate a RIGHT breast
asymmetry with possible distortion, and a possible abnormal RIGHT
axillary lymph node, identified on recent screening mammogram.
EXAM:
DIGITAL DIAGNOSTIC RIGHT MAMMOGRAM WITH CAD AND TOMO
ULTRASOUND RIGHT BREAST

[Series 1: ultrasound right breast limited · 0.06mm/px · 12 of 22 slices shown]
[im 1/22]
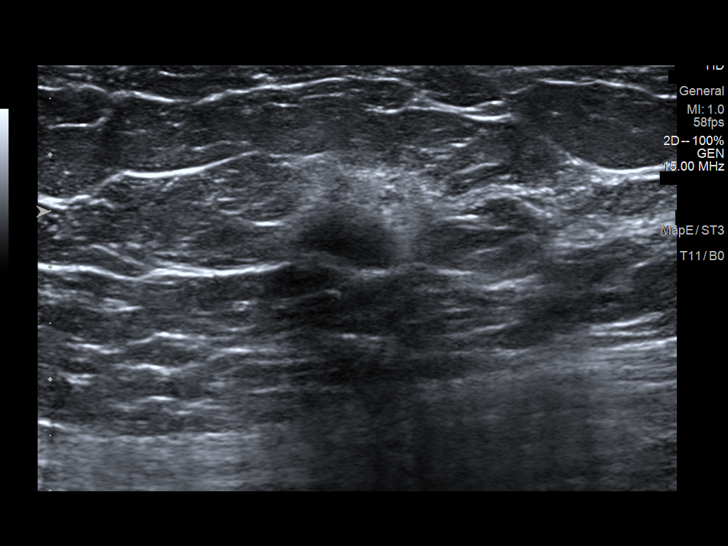
[im 3/22]
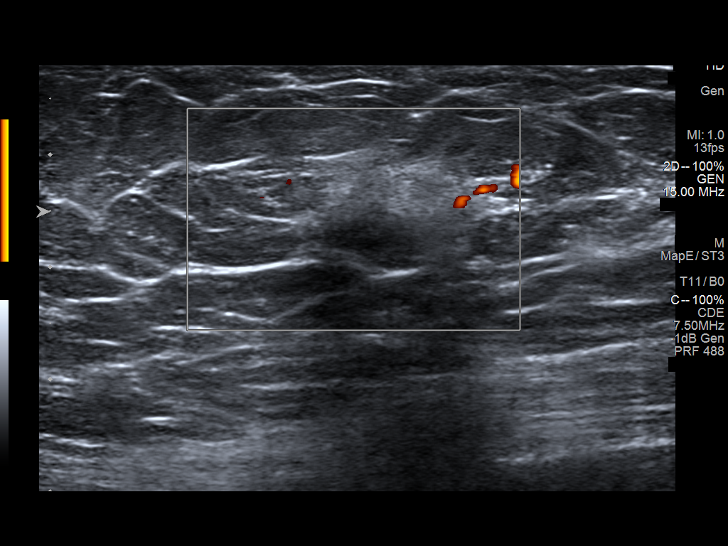
[im 5/22]
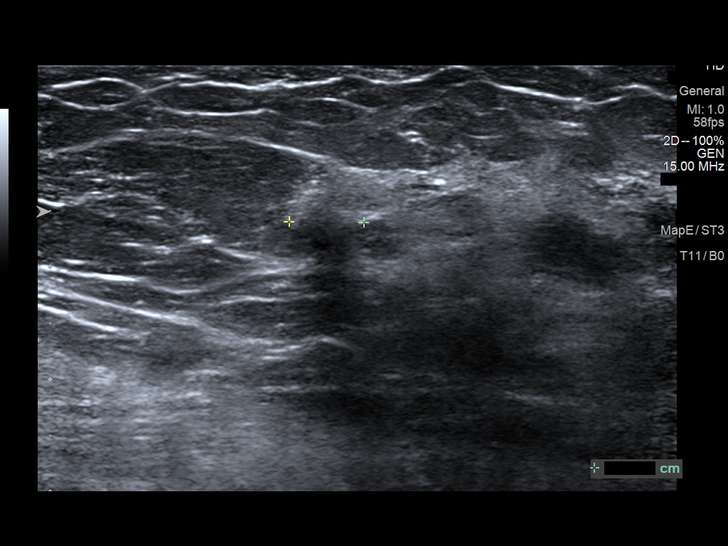
[im 7/22]
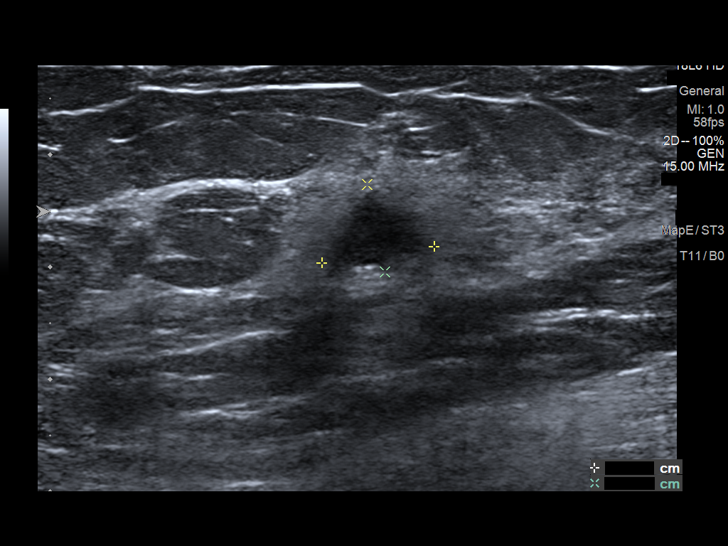
[im 9/22]
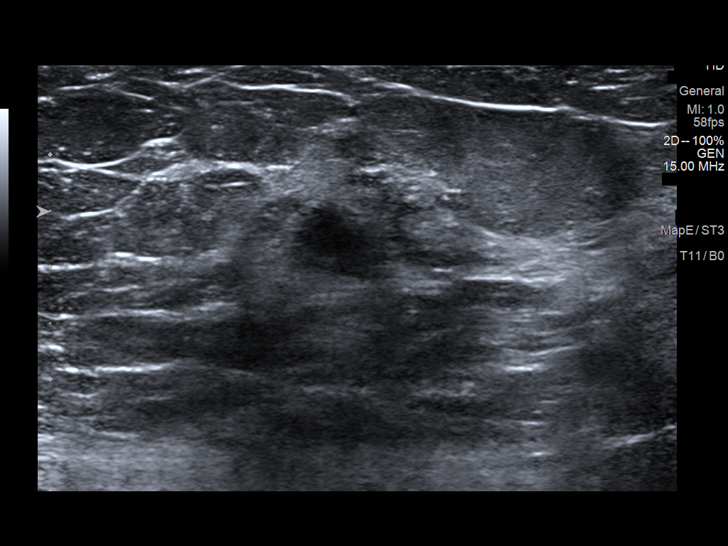
[im 11/22]
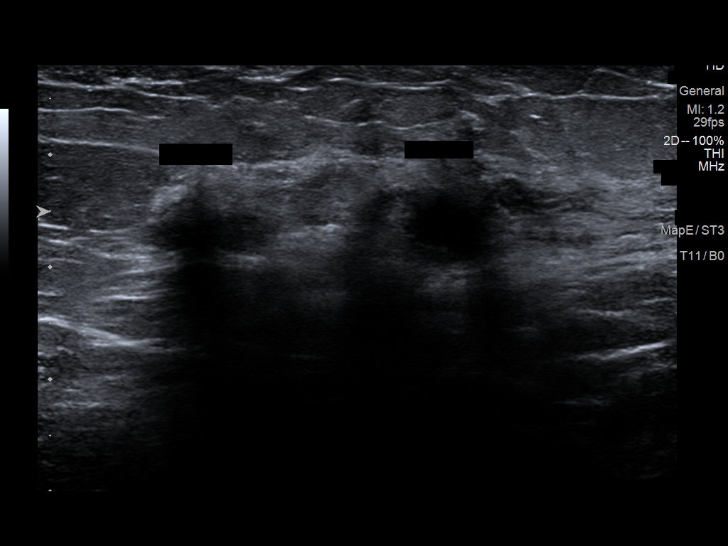
[im 12/22]
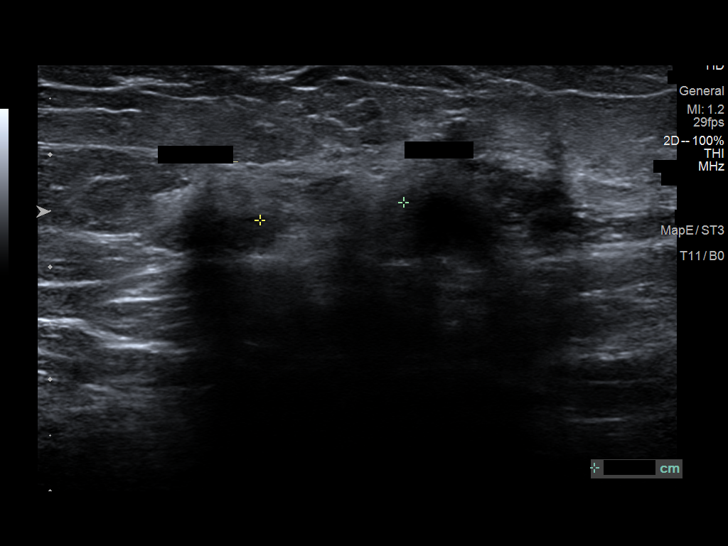
[im 14/22]
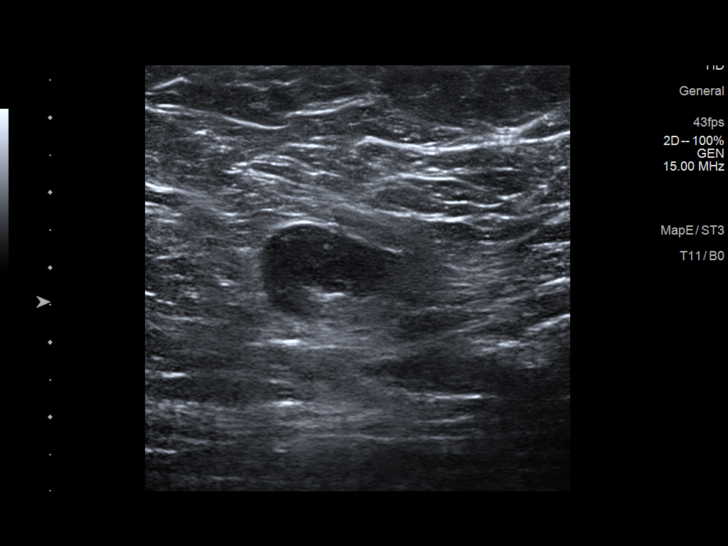
[im 16/22]
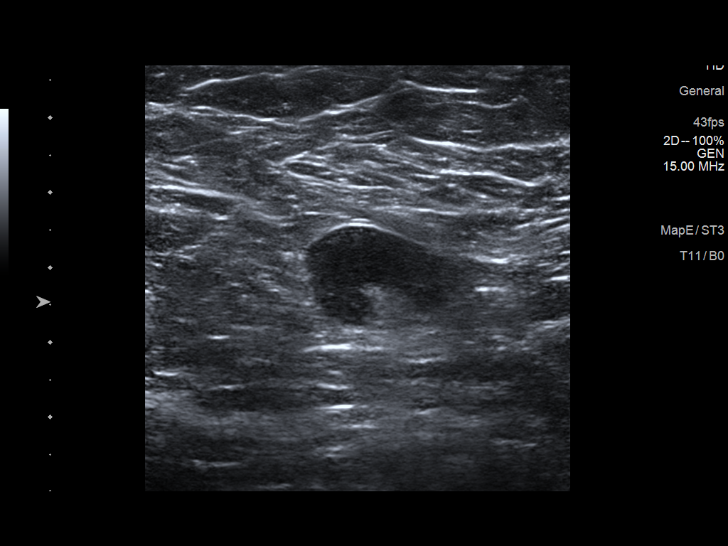
[im 18/22]
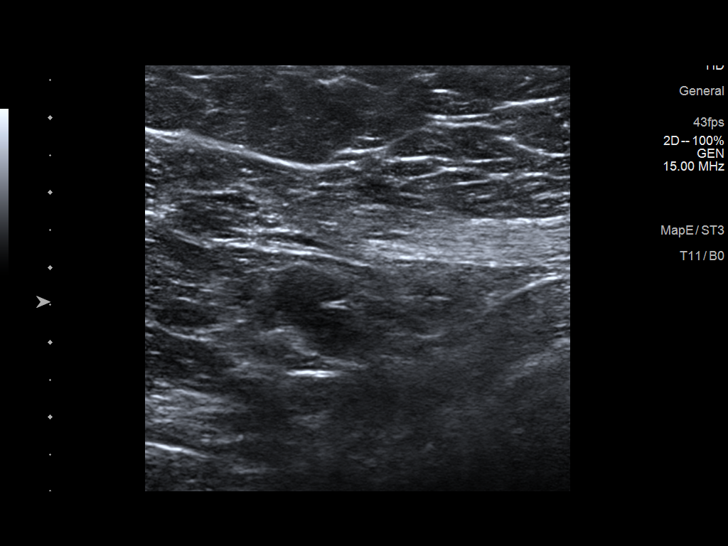
[im 20/22]
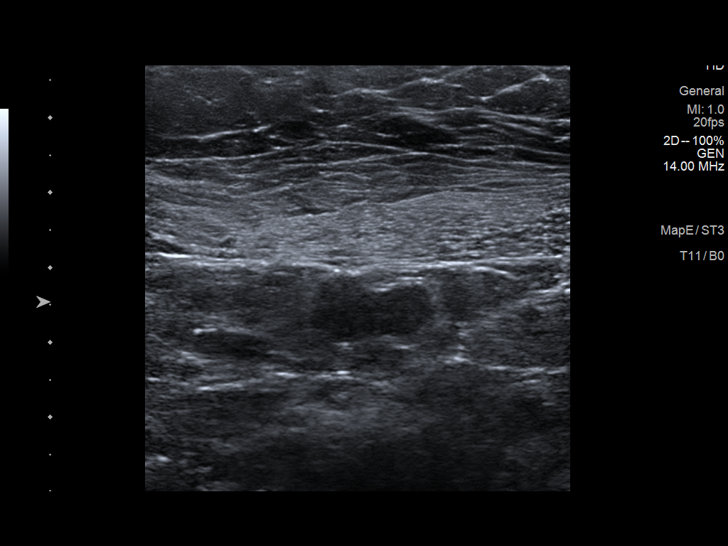
[im 22/22]
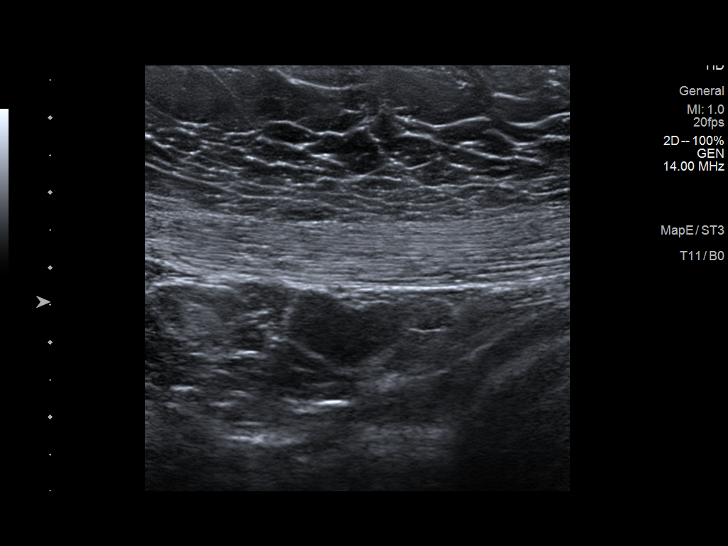

[12 of 22 positions shown; findings below may reference images not displayed]

ACR Breast Density Category b: There are scattered areas of
fibroglandular density.
FINDINGS: On today's additional diagnostic views with spot compression and 3D
tomosynthesis, there is a persistent asymmetry within the slightly
upper RIGHT breast, at middle to posterior depth, measuring
approximately 5 cm extent, with associated distortion.

Additional coarse heterogeneous calcifications are seen within the
slightly outer RIGHT breast, measuring 8 mm extent, located
approximately 1.2 cm lateral to the aforementioned
asymmetry/distortion on the CC projection.

At least 1 prominent lymph node is confirmed within the RIGHT
axilla.

Mammographic images were processed with CAD.

Targeted ultrasound is performed, showing an irregular mass in the
RIGHT breast at the 12 o'clock axis, 7 cm from the nipple, measuring
1.1 x 0.8 x 0.7 cm, likely corresponding to the more posterior
aspect of the asymmetry/distortion seen on mammogram.

There is an additional irregular mass in the RIGHT breast at the 12
o'clock axis, 5 cm from the nipple, measuring 1 x 0.8 x 0.9 cm,
likely corresponding to the more anterior aspect of the
asymmetry/distortion seen on mammogram.

These 2 masses identified by ultrasound are located 1.3 cm apart.

RIGHT axilla: There are at least 3 morphologically abnormal lymph
nodes in the RIGHT axilla, most prominent lymph node demonstrating
an abnormal cortical thickness of 9 mm.
IMPRESSION: 1. Irregular mass in the RIGHT breast at the 12 o'clock axis, 7 cm
from the nipple, measuring 1.1 cm, likely corresponding to the more
posterior portion of the asymmetry/distortion seen on mammogram.
Ultrasound-guided biopsy is recommended.
2. Additional irregular mass in the RIGHT breast at the 12 o'clock
axis, 5 cm from the nipple, measuring 1 cm, likely corresponding to
the more anterior aspect of the asymmetry/distortion seen on
mammogram. Ultrasound-guided biopsy is also recommended for this
mass.
3. These 2 masses seen on ultrasound are located 1.3 cm apart.
4. At least 3 morphologically abnormal lymph nodes in the RIGHT
axilla, most prominent lymph node with an abnormal cortical
thickness of 9 mm. Ultrasound-guided biopsy is recommended for the
most prominent lymph node.
5. Coarse heterogeneous calcifications within the slightly outer
RIGHT breast, measuring 8 mm extent, located approximately 1.2 cm
lateral to the asymmetry/distortion seen on mammogram.

RECOMMENDATION:
1. Ultrasound-guided biopsies of both masses in the RIGHT breast
identified by ultrasound at the 12 o'clock axis (7 cm from the
nipple and 5 cm from the nipple respectively).
2. Ultrasound-guided biopsy of the most prominent lymph node in the
RIGHT axilla.
3. Postprocedure mammogram to ensure that the masses correspond to
the extent of the asymmetry/distortion seen on mammogram. Additional
stereotactic biopsy may be needed if the clips do not approximate
the anterior and posterior extents.
4. Depending on the location of the post biopsy clips, and if breast
conservation surgery is considered, stereotactic biopsy may be also
needed for the coarse heterogeneous calcifications within the
slightly outer RIGHT breast, measuring 8 mm extent, located 1.2 cm
from the asymmetry/distortion.

Ultrasound-guided biopsies are scheduled for [REDACTED].

I have discussed the findings and recommendations with the patient.
Results were also provided in writing at the conclusion of the
visit. If applicable, a reminder letter will be sent to the patient
regarding the next appointment.

BI-RADS CATEGORY  5: Highly suggestive of malignancy.

## 2019-06-20 ENCOUNTER — Other Ambulatory Visit: Payer: Self-pay

## 2019-06-20 ENCOUNTER — Ambulatory Visit (INDEPENDENT_AMBULATORY_CARE_PROVIDER_SITE_OTHER): Payer: Medicare Other | Admitting: Cardiology

## 2019-06-20 ENCOUNTER — Encounter: Payer: Self-pay | Admitting: Cardiology

## 2019-06-20 VITALS — BP 130/60 | HR 76 | Ht 62.0 in | Wt 222.2 lb

## 2019-06-20 DIAGNOSIS — Z79899 Other long term (current) drug therapy: Secondary | ICD-10-CM

## 2019-06-20 DIAGNOSIS — N1832 Chronic kidney disease, stage 3b: Secondary | ICD-10-CM | POA: Diagnosis not present

## 2019-06-20 DIAGNOSIS — I5032 Chronic diastolic (congestive) heart failure: Secondary | ICD-10-CM | POA: Diagnosis not present

## 2019-06-20 DIAGNOSIS — I272 Pulmonary hypertension, unspecified: Secondary | ICD-10-CM | POA: Diagnosis not present

## 2019-06-20 LAB — BASIC METABOLIC PANEL
BUN/Creatinine Ratio: 17 (ref 12–28)
BUN: 28 mg/dL — ABNORMAL HIGH (ref 8–27)
CO2: 22 mmol/L (ref 20–29)
Calcium: 9.9 mg/dL (ref 8.7–10.3)
Chloride: 99 mmol/L (ref 96–106)
Creatinine, Ser: 1.62 mg/dL — ABNORMAL HIGH (ref 0.57–1.00)
GFR calc Af Amer: 33 mL/min/{1.73_m2} — ABNORMAL LOW (ref >59)
GFR calc non Af Amer: 29 mL/min/{1.73_m2} — ABNORMAL LOW (ref >59)
Glucose: 188 mg/dL — ABNORMAL HIGH (ref 65–99)
Potassium: 3.9 mmol/L (ref 3.5–5.2)
Sodium: 136 mmol/L (ref 134–144)

## 2019-06-20 LAB — MAGNESIUM: Magnesium: 1.6 mg/dL (ref 1.6–2.3)

## 2019-06-20 MED ORDER — FUROSEMIDE 40 MG PO TABS: 40.0000 mg | ORAL_TABLET | Freq: Two times a day (BID) | ORAL | 6 refills | Status: DC

## 2019-06-20 MED ORDER — MAGNESIUM OXIDE 400 MG PO CAPS: 1.0000 | ORAL_CAPSULE | Freq: Two times a day (BID) | ORAL | 6 refills | Status: DC

## 2019-06-20 NOTE — Progress Notes (Signed)
Cardiology Office Note:    Date:  06/20/2019   ID:  Stacy Walls, DOB 1933/02/18, MRN [id number]  PCP:  Sonia Side., FNP  Cardiologist:  Candee Furbish, MD  Electrophysiologist:  None   Referring MD: Sonia Side., FNP     History of Present Illness:    Stacy Walls is a 86 y.o. female here for repeat potassium level, follow-up, last seen by [name], PA.  She has coronary artery calcification on CT scan and 2020 with aortic atherosclerosis as well.  Diabetes hypertension hyperlipidemia chronic kidney disease and breast cancer status post partial mastectomy in 2020 with radiation and tamoxifen.  Back in November 2020 she was admitted with weakness mental status changes and headache with hyponatremia and acute kidney injury.  She had prolonged QT on ECG after being treated with Levaquin for UTI.  HCTZ was stopped and her QT and sodium level improved.  After the hospital stay she went to inpatient rehab.  Her blood pressure was challenging to control.  BNP was elevated.  She was slowly getting stronger and stronger.  Past Medical History:  Diagnosis Date  . Acute cholecystitis   . Allergic rhinitis   . Anemia   . Arthritis   . Cancer [facility])    Right breast  . Chest pain   . Chronic renal insufficiency    followed by Dr Justin Mend stage 3  . Clostridium difficile colitis   . Constipation   . Coronary atherosclerosis of native coronary vessel   . Cough   . Depression    situational - husband died May 28, 2018  . Diabetes mellitus   . Dizziness   . DM2 (diabetes mellitus, type 2) ([place])   . Dyshidrosis   . Family history of adverse reaction to anesthesia    daughter - nausea  . GERD (gastroesophageal reflux disease)   . Gout   . HLD (hyperlipidemia)   . HTN (hypertension)   . Hypothyroidism   . Neck mass   . Neck pain   . Osteoporosis   . Personal history of radiation therapy 12/2018  . Routine general medical examination at a health care facility     . Secondary hyperparathyroidism (of renal origin)   . Urinary incontinence   . Urinary tract infection     Past Surgical History:  Procedure Laterality Date  . bone density  08/21/05  . BREAST BIOPSY Right 03/30/2018   x3  . BREAST BIOPSY Right 04/25/2018  . BREAST LUMPECTOMY Right 06/07/2018  . BREAST LUMPECTOMY WITH RADIOACTIVE SEED AND AXILLARY LYMPH NODE DISSECTION Right 06/07/2018   Procedure: RIGHT BREAST LUMPECTOMY WITH BRACKETED RADIOACTIVE SEEDS AND RIGHT COMPLETE AXILLARY LYMPH NODE DISSECTION;  Surgeon: Fanny Skates, MD;  Location: [facility];  Service: General;  Laterality: Right;  . C-section (other)  1977  . CHOLECYSTECTOMY    . ELECTROCARDIOGRAM  02/01/06  . L ankle surgery Left 1988  . total left knee Left 2007    Current Medications: Current Meds  Medication Sig  . acetaminophen (TYLENOL) 325 MG tablet Take 1-2 tablets (325-650 mg total) by mouth every 4 (four) hours as needed for mild pain.  Marland Kitchen allopurinol (ZYLOPRIM) 100 MG tablet Take 100 mg by mouth daily.  Marland Kitchen aspirin EC 81 MG tablet Take 81 mg by mouth daily.  Marland Kitchen atorvastatin (LIPITOR) 20 MG tablet Take 1 tablet (20 mg total) by mouth daily.  . Calcium Carb-Cholecalciferol (CALCIUM 500 +D PO) Take 1 tablet by mouth daily.  . cetirizine (  ZYRTEC) 10 MG tablet Take 10 mg by mouth daily.   . diclofenac sodium (VOLTAREN) 1 % GEL APPLY 2 GRAMS EXTERNALLY TO THE AFFECTED AREA FOUR TIMES DAILY  . Ferrous Sulfate (IRON) 325 (65 Fe) MG TABS Take 1 tablet (325 mg total) by mouth daily.  . furosemide (LASIX) 40 MG tablet Take 1 tablet (40 mg total) by mouth 2 (two) times daily.  . hydrALAZINE (APRESOLINE) 25 MG tablet Take 1 tablet (25 mg total) by mouth 3 (three) times daily.  Marland Kitchen levothyroxine (SYNTHROID) 112 MCG tablet Take 1 tablet (112 mcg total) by mouth daily at 6 (six) AM.  . liothyronine (CYTOMEL) 5 MCG tablet Take 1 tablet (5 mcg total) by mouth daily.  . Magnesium Oxide 400 MG CAPS Take 1 capsule (400 mg total) by  mouth in the morning and at bedtime.  . metoprolol tartrate (LOPRESSOR) 25 MG tablet Take 1 tablet (25 mg total) by mouth 2 (two) times daily.  . Multiple Vitamins-Minerals (MULTIVITAMIN WOMEN 50+ PO) Take 1 tablet by mouth daily.   Marland Kitchen omeprazole (PRILOSEC) 20 MG capsule TAKE 1 CAPSULE(20 MG) BY MOUTH DAILY  . Polyethyl Glycol-Propyl Glycol (SYSTANE) 0.4-0.3 % SOLN Place 1 drop into both eyes daily as needed (for dry eyes).  . polyethylene glycol (MIRALAX / GLYCOLAX) 17 g packet Take 17 g by mouth as needed.  . tamoxifen (NOLVADEX) 20 MG tablet TAKE 1 TABLET(20 MG) BY MOUTH DAILY  . VITAMIN D PO Take 1 tablet by mouth daily.  . [DISCONTINUED] furosemide (LASIX) 40 MG tablet Take 1.5 tablets (60 mg total) by mouth daily.  . [DISCONTINUED] Magnesium Oxide 400 MG CAPS Take 1 capsule (400 mg total) by mouth daily.     Allergies:   Ace inhibitors, Azithromycin, Pioglitazone, Sulfonamide derivatives, and Tramadol   Social History   Socioeconomic History  . Marital status: Widowed    Spouse name: Not on file  . Number of children: Not on file  . Years of education: Not on file  . Highest education level: Not on file  Occupational History  . Occupation: retired   Tobacco Use  . Smoking status: Never Smoker  . Smokeless tobacco: Never Used  Substance and Sexual Activity  . Alcohol use: No  . Drug use: No  . Sexual activity: Not on file  Other Topics Concern  . Not on file  Social History Narrative   Lives with husband and has lived in [place] for over 30 years.    Social Determinants of Health   Financial Resource Strain:   . Difficulty of Paying Living Expenses: Not on file  Food Insecurity:   . Worried About Charity fundraiser in the Last Year: Not on file  . Ran Out of Food in the Last Year: Not on file  Transportation Needs: No Transportation Needs  . Lack of Transportation (Medical): No  . Lack of Transportation (Non-Medical): No  Physical Activity:   . Days of Exercise  per Week: Not on file  . Minutes of Exercise per Session: Not on file  Stress:   . Feeling of Stress : Not on file  Social Connections:   . Frequency of Communication with Friends and Family: Not on file  . Frequency of Social Gatherings with Friends and Family: Not on file  . Attends Religious Services: Not on file  . Active Member of Clubs or Organizations: Not on file  . Attends Archivist Meetings: Not on file  . Marital Status: Not on file  Family History: The patient's family history includes Cancer in her brother; Coronary artery disease in her father; Diabetes in her brother, father, and sister; Stroke in her father and sister. There is no history of Breast cancer.  ROS:   Please see the history of present illness.     All other systems reviewed and are negative.  EKGs/Labs/Other Studies Reviewed:    The following studies were reviewed today: Echocardiogram 03/18/2019-EF 65%  EKG: 03/18/2019 -sinus 75 no other changes.  Recent Labs: 03/20/2019: B Natriuretic Peptide 73.6; TSH 11.724 03/28/2019: ALT 27 03/30/2019: Hemoglobin 8.9; Platelets 126 05/25/2019: NT-Pro BNP 3,487 06/09/2019: BUN 26; Creatinine, Ser 1.56; Magnesium 1.5; Potassium 4.0; Sodium 134  Recent Lipid Panel    Component Value Date/Time   CHOL 260 (H) 09/07/2017 1130   TRIG 274.0 (H) 09/07/2017 1130   HDL 39.70 09/07/2017 1130   CHOLHDL 7 09/07/2017 1130   VLDL 54.8 (H) 09/07/2017 1130   LDLCALC 86 04/01/2015 1200   LDLDIRECT 159.0 09/07/2017 1130    Physical Exam:    VS:  BP 130/60   Pulse 76   Ht 5\' 2"  (1.575 m)   Wt 222 lb 3.2 oz (100.8 kg)   SpO2 96%   BMI 40.64 kg/m     Wt Readings from Last 3 Encounters:  06/20/19 222 lb 3.2 oz (100.8 kg)  05/31/19 227 lb 1.6 oz (103 kg)  05/17/19 232 lb 12.8 oz (105.6 kg)     GEN:  Well nourished, well developed in no acute distress HEENT: Normal NECK: No JVD; No carotid bruits LYMPHATICS: No lymphadenopathy CARDIAC: RRR, no  murmurs, rubs, gallops RESPIRATORY:  Clear to auscultation without rales, wheezing or rhonchi  ABDOMEN: Soft, non-tender, non-distended MUSCULOSKELETAL: Minor ankle edema; No deformity  SKIN: Warm and dry NEUROLOGIC:  Alert and oriented x 3 PSYCHIATRIC:  Normal affect   ASSESSMENT:    1. Chronic diastolic CHF (congestive heart failure) (HCC)   2. Stage 3b chronic kidney disease   3. Hypomagnesemia   4. Pulmonary hypertension, unspecified ([place])   5. Medication management    PLAN:    In order of problems listed above:  Chronic diastolic heart failure -EF 65%, prior NYHA class IIb symptoms.,  Creatinine has been 1.28 potassium 3.7, we increased her furosemide to 40 mg twice a day.  She says that she is taking this dose currently.  Checking magnesium today.  Holding off of HCTZ use.  Excess volume swelling legs.  We will go ahead and check a basic metabolic profile as well.  Secondary pulmonary hypertension -Likely from left-sided heart failure age.  No further testing at this time other than echocardiogram. Her ankle swelling is improved.  Stage IIIb chronic kidney disease -This worsened in the hospital, recently has improved however.  [place] [facility], Dr. Justin Mend has been following.  When to make sure her magnesium level is okay with her renal function.  Hypomagnesemia -Prior mag 1.4, Mag-Ox 400 mg twice a day has been started.  Watching with her kidney function.  Coronary artery disease -Coronary calcification noted, inferior ischemia in 2007 on nuclear stress.  Managed medically secondary to lack of symptoms.  Rare atypical chest pain.  Nonexertional.  Continue with medical management.  See if more symptoms occur, nuclear stress test.  Essential hypertension -Metoprolol furosemide hydralazine increased to 25 3 times a day. -Consideration of amlodipine or isosorbide in the future.  Blood pressure currently improved.  Medication Adjustments/Labs and Tests Ordered: Current  medicines are reviewed at length with the  patient today.  Concerns regarding medicines are outlined above.  Orders Placed This Encounter  Procedures  . Basic metabolic panel  . Magnesium   Meds ordered this encounter  Medications  . furosemide (LASIX) 40 MG tablet    Sig: Take 1 tablet (40 mg total) by mouth 2 (two) times daily.    Dispense:  60 tablet    Refill:  6  . Magnesium Oxide 400 MG CAPS    Sig: Take 1 capsule (400 mg total) by mouth in the morning and at bedtime.    Dispense:  60 capsule    Refill:  6    Patient Instructions  Medication Instructions:  The current medical regimen is effective;  continue present plan and medications.  *If you need a refill on your cardiac medications before your next appointment, please call your pharmacy*  Lab Work: Please have blood work today  (BMP, MG) If you have labs (blood work) drawn today and your tests are completely normal, you will receive your results only by: Marland Kitchen MyChart Message (if you have MyChart) OR . A paper copy in the mail If you have any lab test that is abnormal or we need to change your treatment, we will call you to review the results.  Follow-Up: At [facility], you and your health needs are our priority.  As part of our continuing mission to provide you with exceptional heart care, we have created designated Provider Care Teams.  These Care Teams include your primary Cardiologist (physician) and Advanced Practice Providers (APPs -  Physician Assistants and Nurse Practitioners) who all work together to provide you with the care you need, when you need it.  We recommend signing up for the patient portal called "MyChart".  Sign up information is provided on this After Visit Summary.  MyChart is used to connect with patients for Virtual Visits (Telemedicine).  Patients are able to view lab/test results, encounter notes, upcoming appointments, etc.  Non-urgent messages can be sent to your provider as well.   To learn  more about what you can do with MyChart, go to [email or web address].    Your next appointment:   3 month(s)  The format for your next appointment:   In Person  Provider:   [name], PA and  Candee Furbish, MD in 6 months.  Thank you for choosing [facility]!!        Signed, Candee Furbish, MD  06/20/2019 10:50 AM    [place]

## 2019-06-20 NOTE — Patient Instructions (Addendum)
Medication Instructions:  The current medical regimen is effective;  continue present plan and medications.  *If you need a refill on your cardiac medications before your next appointment, please call your pharmacy*  Lab Work: Please have blood work today  (BMP, MG) If you have labs (blood work) drawn today and your tests are completely normal, you will receive your results only by: Marland Kitchen MyChart Message (if you have MyChart) OR . A paper copy in the mail If you have any lab test that is abnormal or we need to change your treatment, we will call you to review the results.  Follow-Up: At [facility], you and your health needs are our priority.  As part of our continuing mission to provide you with exceptional heart care, we have created designated Provider Care Teams.  These Care Teams include your primary Cardiologist (physician) and Advanced Practice Providers (APPs -  Physician Assistants and Nurse Practitioners) who all work together to provide you with the care you need, when you need it.  We recommend signing up for the patient portal called "MyChart".  Sign up information is provided on this After Visit Summary.  MyChart is used to connect with patients for Virtual Visits (Telemedicine).  Patients are able to view lab/test results, encounter notes, upcoming appointments, etc.  Non-urgent messages can be sent to your provider as well.   To learn more about what you can do with MyChart, go to [email or web address].    Your next appointment:   3 month(s)  The format for your next appointment:   In Person  Provider:   [name], PA and  Candee Furbish, MD in 6 months.  Thank you for choosing [facility]!!

## 2019-06-26 ENCOUNTER — Telehealth: Payer: Self-pay | Admitting: *Deleted

## 2019-06-26 NOTE — Telephone Encounter (Signed)
-----   Message from Alla Feeling, NP sent at 06/25/2019 11:22 AM EST ----- Regarding: RE: Referral OK please document that in her chart  Thanks, Lacie  ----- Message ----- From: Rudie Meyer Sent: 06/23/2019   3:52 PM EST To: Alla Feeling, NP Subject: Referral                                       Hi Lacie,   I received a call from [facility] in regards to your referral for this patient. They stated the patient stated she does not need surgery and so [facility] will not schedule her.

## 2019-06-26 NOTE — Telephone Encounter (Signed)
I received a call from [facility] in regards to your referral from [facility] stated the patient does not need surgery and so [facility] will not schedule her. Stacy Walls has been informed of the status of the referral.

## 2019-08-28 ENCOUNTER — Ambulatory Visit: Payer: Medicare Other | Admitting: Nurse Practitioner

## 2019-08-28 ENCOUNTER — Other Ambulatory Visit: Payer: Medicare Other

## 2019-08-31 IMAGING — MG MM CLIP PLACEMENT
3 series · 3 of 3 positions shown · non-contrast
Comparison: Previous exam(s).

CLINICAL DATA: Evaluate placement of biopsy clips following 2
ultrasound-guided RIGHT breast biopsies and ultrasound-guided RIGHT
axillary biopsy.

EXAM:
DIAGNOSTIC RIGHT MAMMOGRAM POST ULTRASOUND BIOPSY

[R MLO]
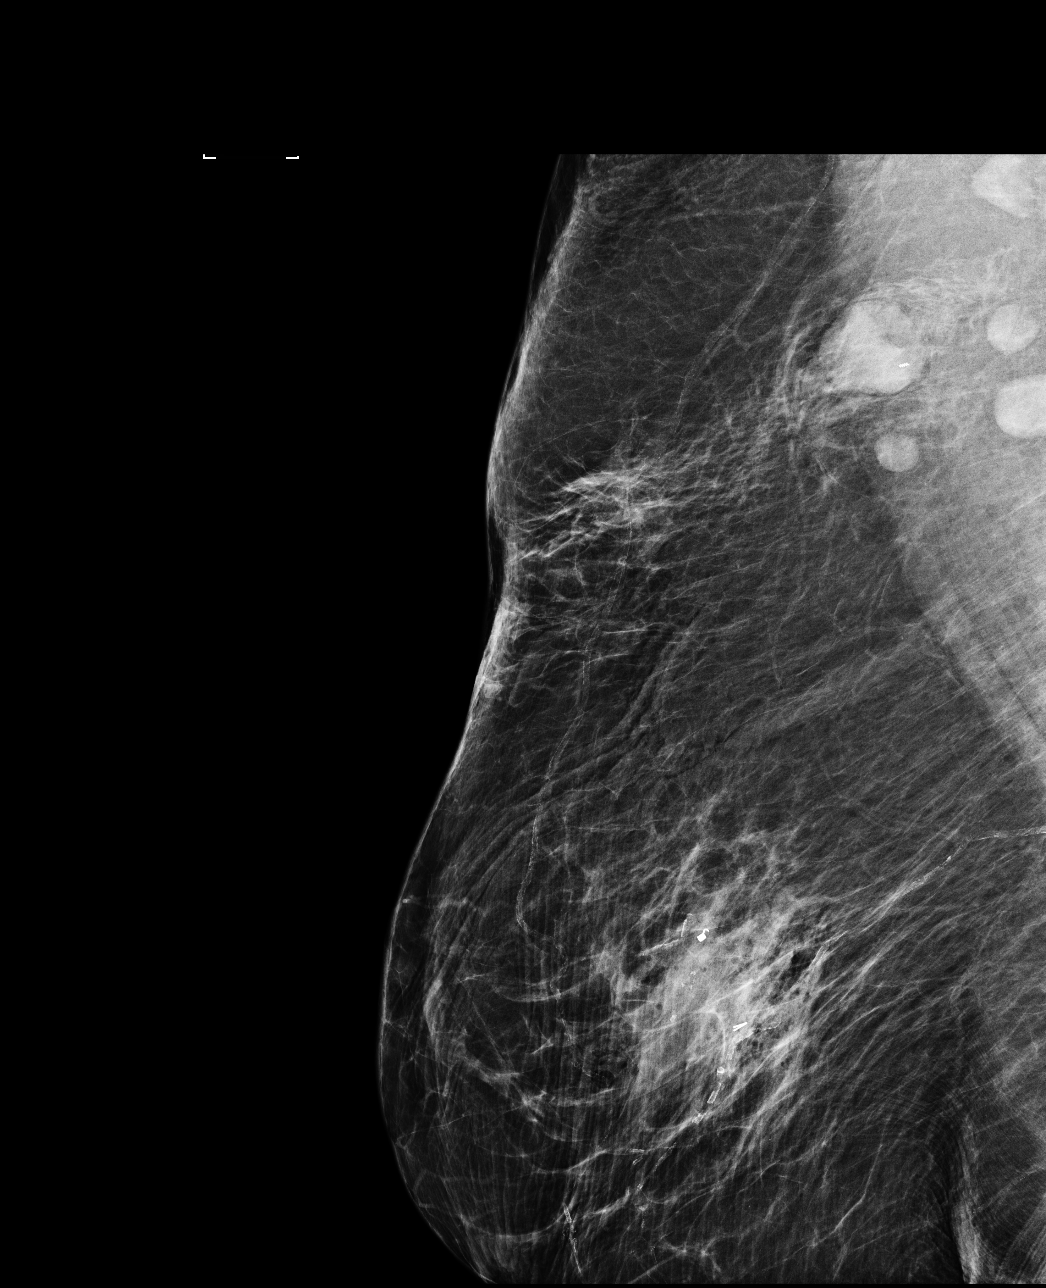

[R ML]
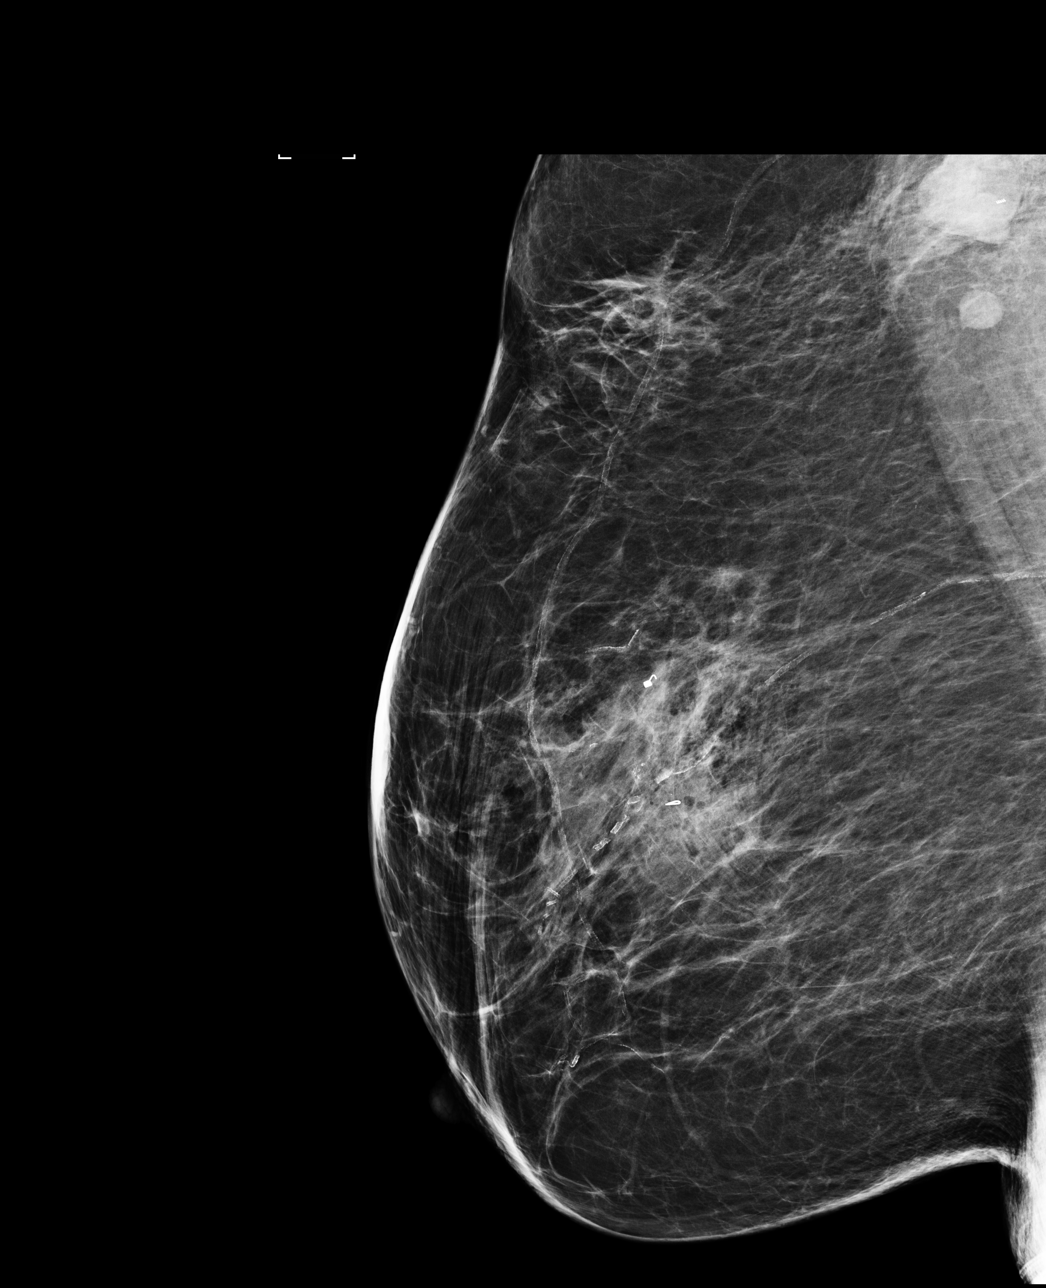

[R CC]
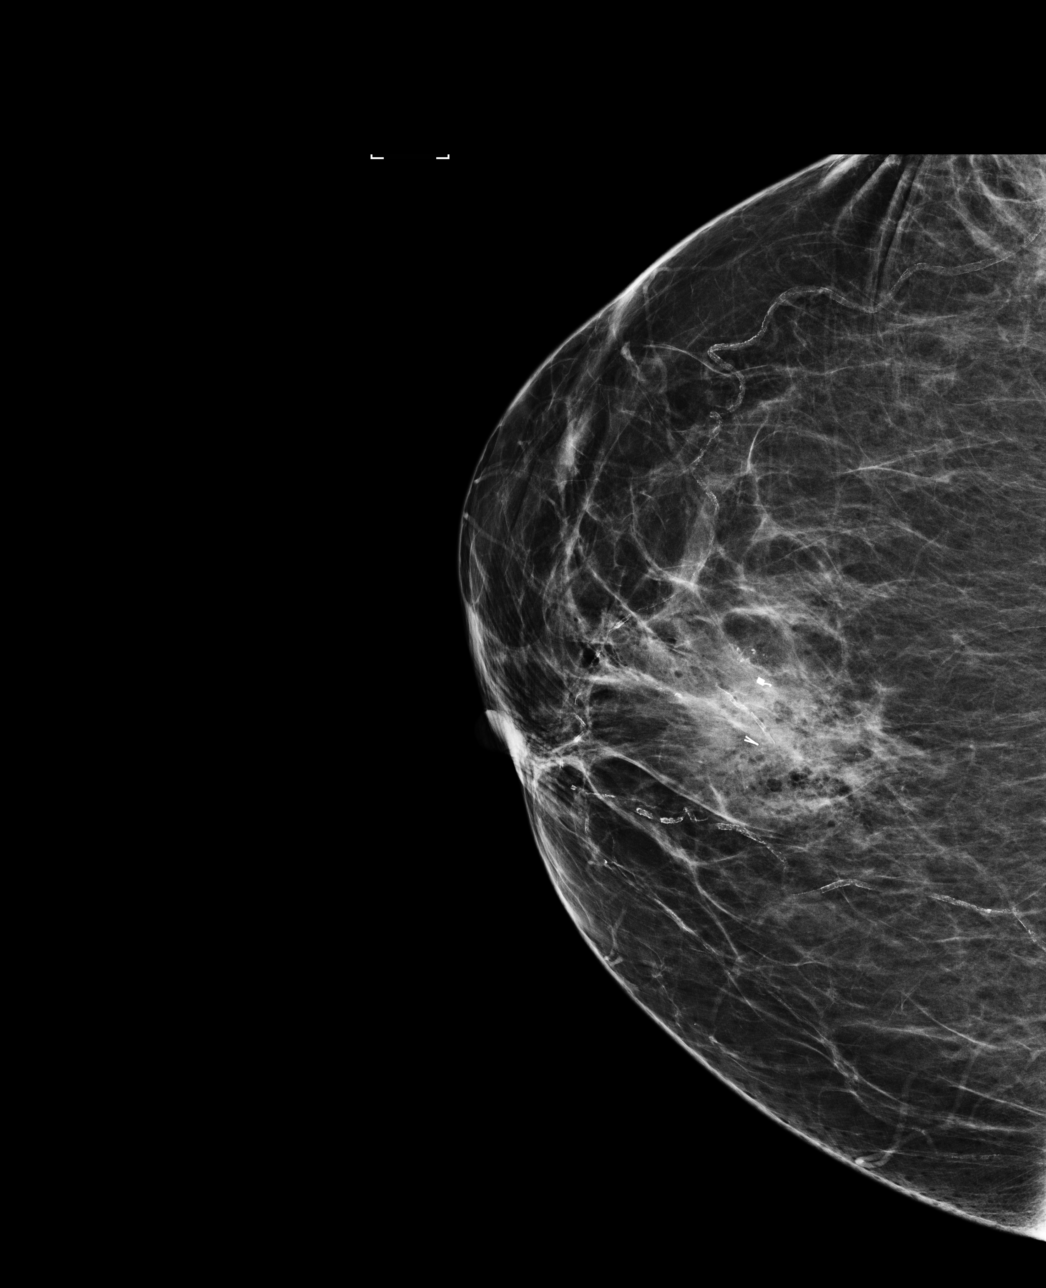

[3 of 3 positions shown; findings below may reference images not displayed]

FINDINGS: Mammographic images were obtained following ultrasound guided of a 1
cm mass at the 12 o'clock position 5 cm from the nipple, 1.1 cm mass
at the 12 o'clock position 7 cm from the nipple and an enlarged
RIGHT axillary lymph node.

The RIBBON clip is in satisfactory position corresponding to the 1
cm RIGHT breast mass biopsied at the 12 o'clock position 5 cm from
the nipple.

The COIL clip is in satisfactory position corresponding to the
cm RIGHT breast mass biopsied at the 12 o'clock position 7 cm from
the nipple.

The HydroMARK clip is in satisfactory position within an enlarged
RIGHT axillary lymph node.

The RIBBON and COIL clips are separated by a distance of 2.5 cm in
the craniocaudal dimension.
IMPRESSION: Satisfactory position of all 3 biopsy clips following 2 RIGHT breast
biopsies and RIGHT axillary lymph node biopsy.

The RIBBON and COIL clips within the RIGHT breast are separated by a
distance of 2.5 cm

Final Assessment: Post Procedure Mammograms for Marker Placement

## 2019-09-04 ENCOUNTER — Telehealth: Payer: Self-pay | Admitting: Cardiology

## 2019-09-04 NOTE — Telephone Encounter (Signed)
Returned call to patient who states she is taking furosemide 40 mg BID and gets up 3 times during the night to urinate. I asked the times she is taking the medications and she reports early morning and 3 pm. I asked if she could take the 2nd dose at 12 or 1 pm to see if she has less nocturia. She agrees that she could take the 1st dose of 7:30 am and the second dose before 1 pm and will call back if her symptoms do not improve. She thanked me for calling her.

## 2019-09-04 NOTE — Telephone Encounter (Signed)
Pt c/o medication issue:  1. Name of Medication: furosemide (LASIX) 40 MG tablet  2. How are you currently taking this medication (dosage and times per day)? One tablet twice a day  3. Are you having a reaction (difficulty breathing--STAT)? no  4. What is your medication issue? Patient states her bladder is weaker and would like to know if she should continue taking 2 tablets a day. She states she thinks it may be too much.

## 2019-09-11 NOTE — Telephone Encounter (Signed)
Agree with plan. Thanks Merica Prell, MD  

## 2019-09-13 ENCOUNTER — Telehealth: Payer: Medicare Other | Admitting: Physician Assistant

## 2019-09-20 ENCOUNTER — Ambulatory Visit: Payer: Medicare Other | Admitting: Physician Assistant

## 2019-09-22 ENCOUNTER — Telehealth: Payer: Medicare Other | Admitting: Physician Assistant

## 2019-09-26 IMAGING — US ULTRASOUND RIGHT BREAST LIMITED
1 series · 6 of 6 positions shown · non-contrast
Comparison: Previous exam(s).

CLINICAL DATA: Patient returns for MR followup. Recently diagnosed
with 2 sites of malignancy in the RIGHT breast and metastatic RIGHT
axillary lymph node. The area of concern is additional non mass
enhancement superior and MEDIAL to the known malignancies in the 12
o'clock location of the RIGHT breast.

EXAM:
ULTRASOUND OF THE RIGHT BREAST

[Series 1: ultrasound right breast limited · 0.06mm/px · 6 of 6 slices shown]
[im 1/6]
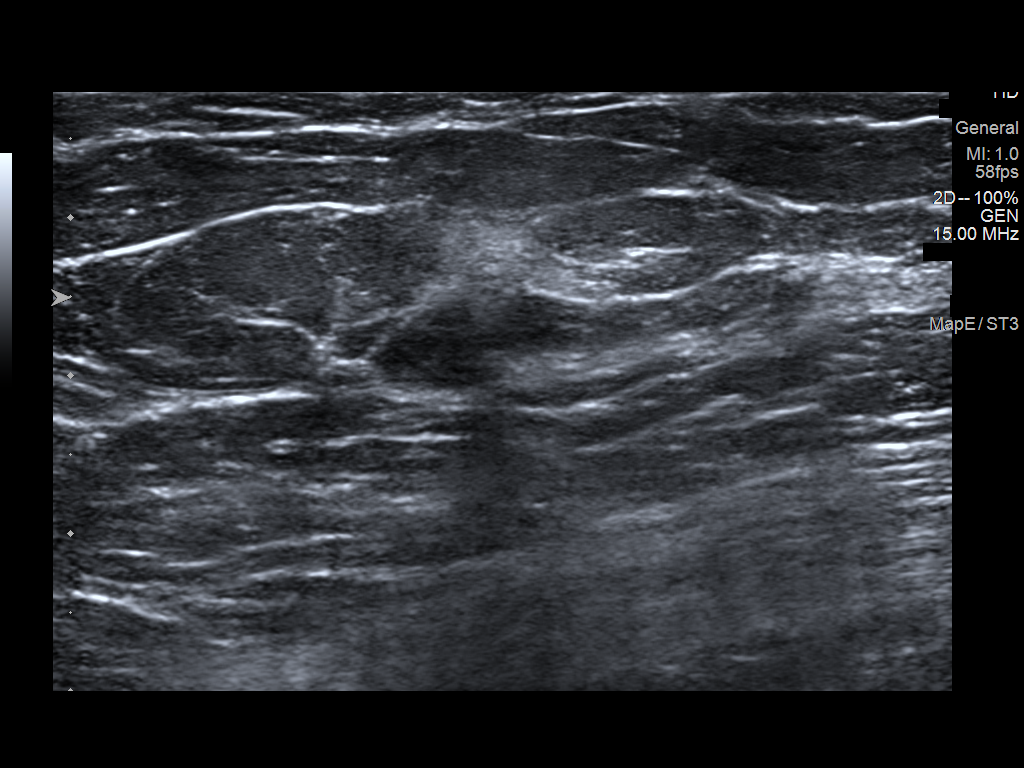
[im 2/6]
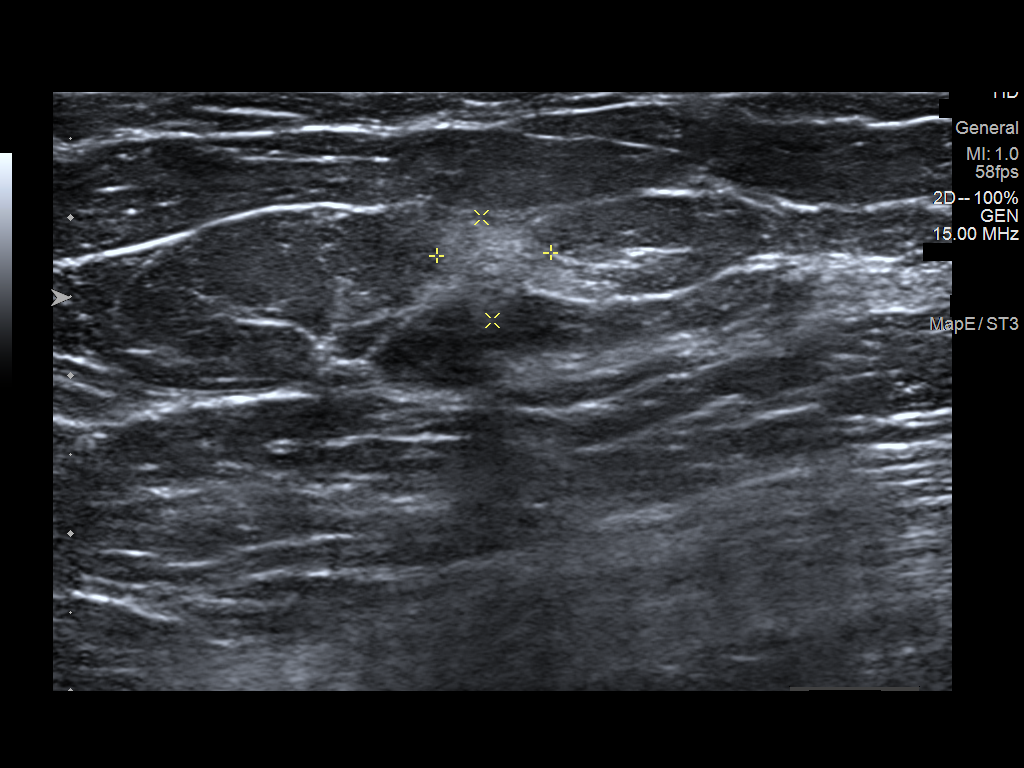
[im 3/6]
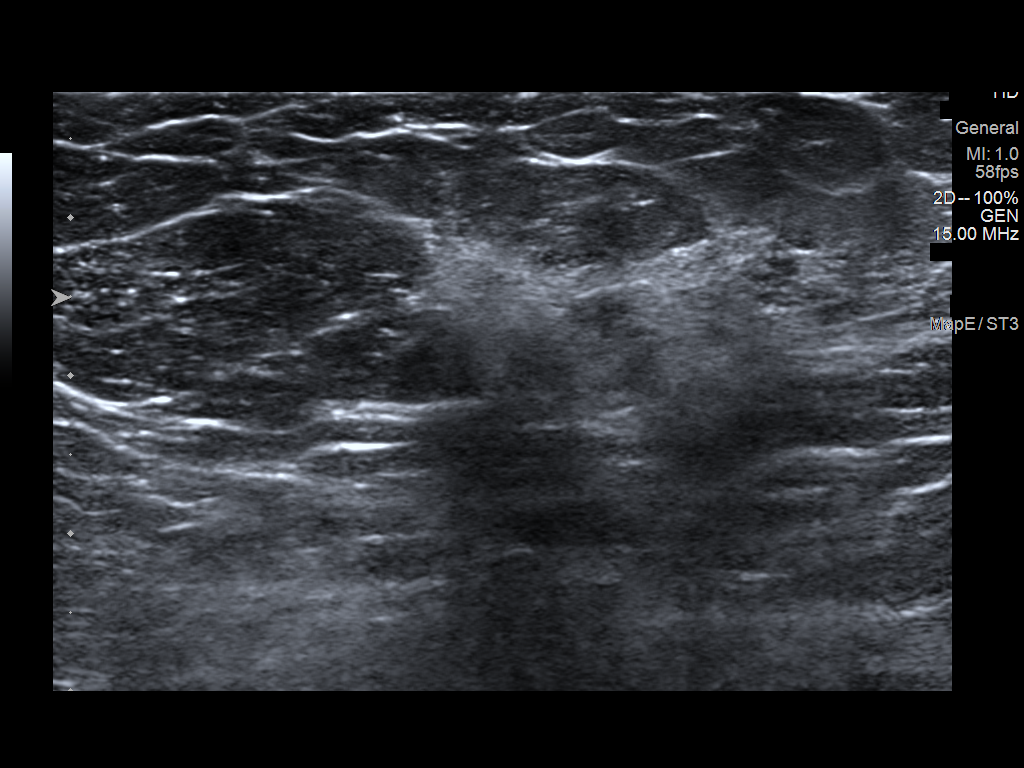
[im 4/6]
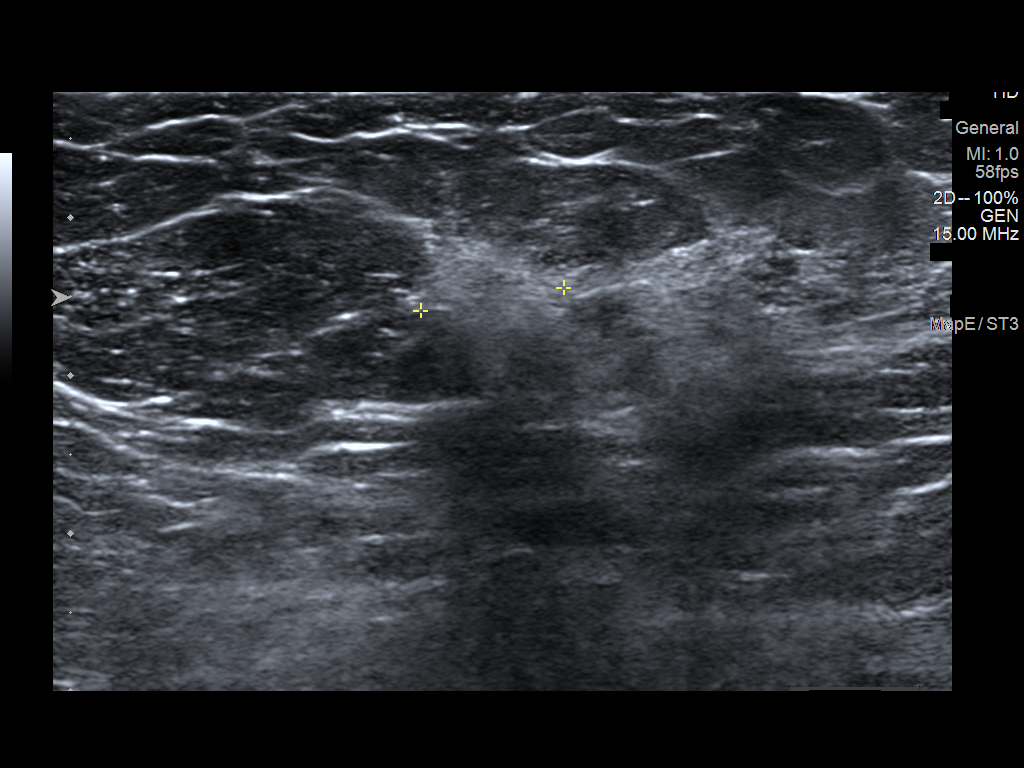
[im 5/6]
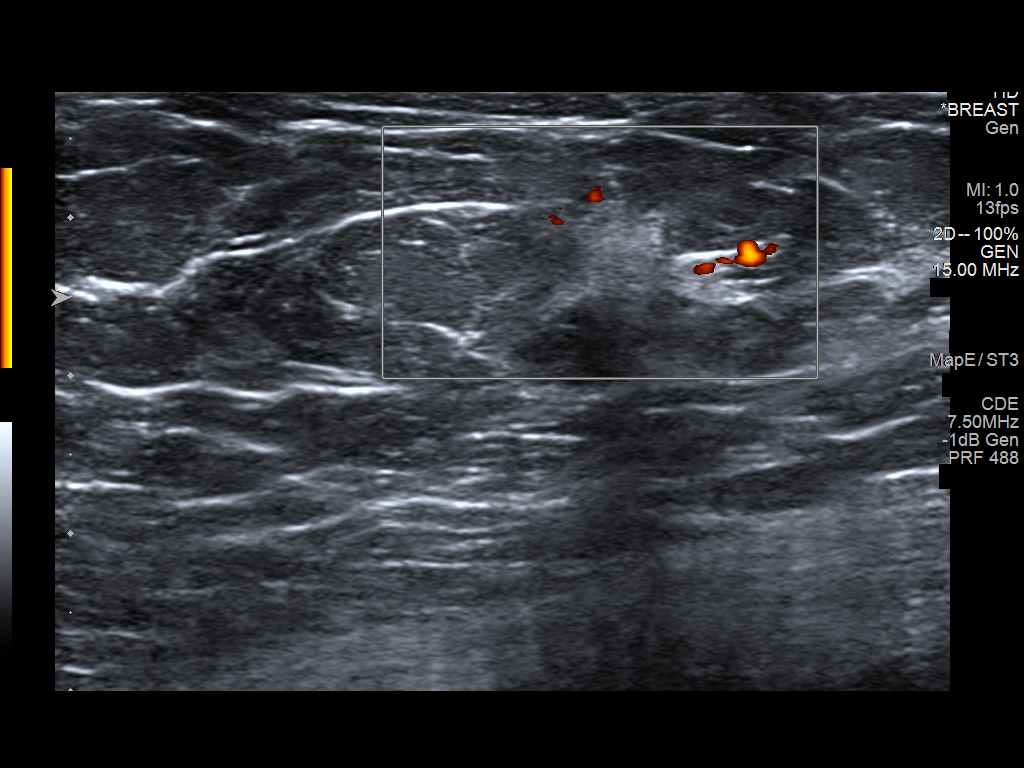
[im 6/6]
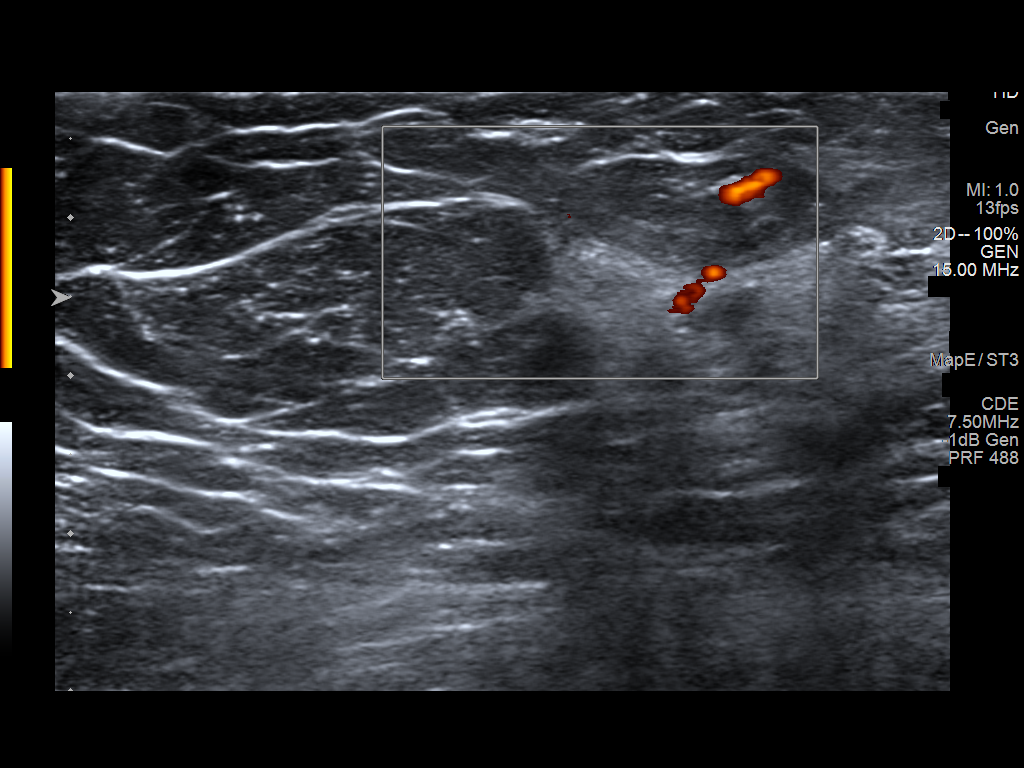

[6 of 6 positions shown; findings below may reference images not displayed]

FINDINGS: On physical exam, well-healed biopsy sites identified in the UPPER
portion of the RIGHT breast. There is no residual hematoma or
palpable mass.

Targeted ultrasound is performed, showing a subtle area of
hyperechoic breast tissue in the 1 o'clock location of the RIGHT
breast 10 centimeters from the nipple estimated to measure 0.7 x
x 0.9 centimeters. This is superior and slightly MEDIAL to the
recently biopsied lesions in the 12 o'clock location of the breast.
Post biopsy changes are identified in the 12 o'clock location.
IMPRESSION: Suspicious hyperechoic tissue in the 1 o'clock location of the RIGHT
breast possibly representing the abnormality identified on MRI
superior and MEDIAL to the areas of known malignancy. Because the
patient has bilateral knee pain and a previous LEFT knee
arthroplasty, MRI is difficult for her. We discussed the options of
MR guided core biopsy versus ultrasound-guided core biopsy. Although
MR guided core biopsy is more accurate for targeting this area, it
is reasonable to start with an ultrasound-guided core biopsy.
Pending results, MR guided biopsy may not be necessary. If
ultrasound-guided biopsy fails to answer questions, we discussed
that MR guided core biopsy may be recommended. I also discussed the
findings with the patient's Dow, Sulara, and her son by
telephone.

RECOMMENDATION:
Ultrasound-guided core biopsy of RIGHT breast lesion

I have discussed the findings and recommendations with the patient.
Results were also provided in writing at the conclusion of the
visit. If applicable, a reminder letter will be sent to the patient
regarding the next appointment.

BI-RADS CATEGORY  4: Suspicious.

## 2019-09-27 NOTE — Progress Notes (Signed)
Stacy Walls   [phone] Fax:([phone]   Clinic Follow up Note   Patient Care Team: Sonia Side., FNP as PCP - General (Family Medicine) Jerline Pain, MD as PCP - Cardiology (Cardiology) Edrick Oh, MD (Nephrology) Maia Breslow, MD (Orthopedic Surgery) Newton Pigg, MD as Consulting Physician (Obstetrics and Gynecology) Truitt Merle, MD as Consulting Physician (Hematology) Fanny Skates, MD as Consulting Physician (General Surgery) Alla Feeling, NP as Nurse Practitioner (Nurse Practitioner) Eppie Gibson, MD as Attending Physician (Radiation Oncology) 09/28/2019  CHIEF COMPLAINT: F/u right breast cancer   SUMMARY OF ONCOLOGIC HISTORY: Oncology History Overview Note  Cancer Staging Cancer of central portion of right female breast [facility]) Staging form: Breast, AJCC 8th Edition - Clinical stage from 03/30/2018: Stage IB (cT1c(m), cN1, cM0, G2, ER+, PR+, HER2-) - Signed by Truitt Merle, MD on 04/08/2018 - Pathologic stage from 06/07/2018: Stage IB (pT2(m), pN2a, cM0, G2, ER+, PR+, HER2-) - Signed by Gardenia Phlegm, NP on 06/22/2018     Cancer of central portion of right female breast ([place])  01/13/2018 Mammogram   Diagnostic Mammogram 01/13/18  RECOMMENDATION: 1. Ultrasound-guided biopsies of both masses in the RIGHT breast identified by ultrasound at the 12 o'clock axis (7 cm from the nipple measuring 1.1 x 0.8 x 0.7 cm and 5 cm from the nipple measuring 1 x 0.8 x 0.9 cm respectively). 2. Ultrasound-guided biopsy of the most prominent lymph node in the RIGHT axilla. 3. Postprocedure mammogram to ensure that the masses correspond to the extent of the asymmetry/distortion seen on mammogram. Additional stereotactic biopsy may be needed if the clips do not approximate the anterior and posterior extents. 4. Depending on the location of the post biopsy clips, and if breast conservation surgery is considered, stereotactic biopsy may be  also needed for the coarse heterogeneous calcifications within the slightly outer RIGHT breast, measuring 8 mm extent, located 1.2 cm from the asymmetry/distortion.   03/30/2018 Cancer Staging   Staging form: Breast, AJCC 8th Edition - Clinical stage from 03/30/2018: Stage IB (cT1c(m), cN1, cM0, G2, ER+, PR+, HER2-) - Signed by Truitt Merle, MD on 04/08/2018   03/30/2018 Initial Biopsy   Diagnosis 03/30/18 1. Breast, right, needle core biopsy, 12 o'clock position, 5cm from nipple - INVASIVE LOBULAR CARCINOMA, GRADE 2. SEE NOTE. - LOBULAR CARCINOMA IN SITU, INTERMEDIATE NUCLEAR GRADE. 2. Breast, right, needle core biopsy, 12 o'clock position 7cfn - INVASIVE LOBULAR CARCINOMA, GRADE 2. SEE NOTE. 1 of 3 FINAL for [place], Myosha R ([id number]) Diagnosis(continued) - LOBULAR CARCINOMA IN SITU, INTERMEDIATE NUCLEAR GRADE. 3. Lymph node, needle/core biopsy, right axilla - METASTATIC LOBULAR CARCINOMA TO LYMPH NODE.   03/30/2018 Receptors her2   Results: IMMUNOHISTOCHEMICAL AND MORPHOMETRIC ANALYSIS PERFORMED MANUALLY The tumor cells are NEGATIVE for Her2 (1+). Estrogen Receptor: 100%, POSITIVE, STRONG STAINING INTENSITY Progesterone Receptor: 70%, POSITIVE, STRONG STAINING INTENSITY Proliferation Marker Ki67: 10%   04/08/2018 Initial Diagnosis   Cancer of central portion of right female breast ([place])   04/27/2018 Imaging   CT CAP W Contrast 04/27/18  IMPRESSION: 1. Right axillary and right subpectoral lymphadenopathy, suspicious for metastatic disease. PET-CT may prove helpful to further evaluate. 2. Cholelithiasis. 3. Colonic diverticulosis without diverticulitis. 4.  Aortic Atherosclerois (ICD10-170.0)   04/28/2018 Imaging   Bone Scan 04/28/18  IMPRESSION: No scintigraphic evidence skeletal metastasis. Severe degenerative uptake in the medial compartment RIGHT knee.   06/07/2018 Cancer Staging   Staging form: Breast, AJCC 8th Edition - Pathologic stage from 06/07/2018: Stage IB  (pT2(m),  pN2a, cM0, G2, ER+, PR+, HER2-) - Signed by Gardenia Phlegm, NP on 06/22/2018   06/07/2018 Surgery   RIGHT BREAST LUMPECTOMY WITH BRACKETED RADIOACTIVE SEEDS AND RIGHT COMPLETE AXILLARY LYMPH NODE DISSECTION by Dr. Dalbert Batman 06/07/18    06/07/2018 Pathology Results   Diagnosis 06/07/18 1. Breast, lumpectomy, right with radioactive seeds - INVASIVE LOBULAR CARCINOMA, MULTIFOCAL, LARGER FOCUS IS 3 CM, NOTTINGHAM GRADE 2 OF 3. - MARGINS OF RESECTION ARE NOT INVOLVED (CLOSEST MARGIN: LESS THAN 1 MM, ANTERIOR). - BIOPSY SITE CHANGES. - SEE ONCOLOGY TABLE. 2. Lymph nodes, regional resection, right axillary contents - METASTATIC CARCINOMA PRESENT IN SEVEN OF FOURTEEN LYMPH NODES (7/14). 3. Breast, excision, inferior margin - BREAST PARENCHYMA, NEGATIVE FOR CARCINOMA.   06/2018 -  Anti-estrogen oral therapy   Tamoxifen 36m daily starting in March or April 2020.    10/13/2018 - 11/25/2018 Radiation Therapy   Adjuvant radiation to right breat per Dr. SIsidore Moos  05/31/2019 Survivorship   SCP delivered by LCira Rue NP      CURRENT THERAPY: Tamoxifen, beginning 06/2018 before surgery  INTERVAL HISTORY: Ms. AKrizekreturns for f/u as scheduled. She feels well. She is stronger and more active independently at home. Cooking for herself. Ambulates with cane but in a wheelchair today for long walks between appointments. Denies recent fall. She continues tamoxifen. Denies bone/joint pain except a chronic knee pain with bad weather. Denies hot flashes. She manages chronic constipation with miralax. Denies rectal or vaginal bleeding. Mood is OK, gets depressed at times while thinking about her husband. Has strong family support. Denies concerns in her breasts such as new lump/mass, nipple discharge or inversion. Denies fever, chills, cough, chest pain, dyspnea, or leg edema.    MEDICAL HISTORY:  Past Medical History:  Diagnosis Date  . Acute cholecystitis   . Allergic rhinitis   . Anemia     . Arthritis   . Cancer ([facility]    Right breast  . Chest pain   . Chronic renal insufficiency    followed by Dr WJustin Mendstage 3  . Clostridium difficile colitis   . Constipation   . Coronary atherosclerosis of native coronary vessel   . Cough   . Depression    situational - husband died 1January 30, 2020 . Diabetes mellitus   . Dizziness   . DM[place]   . Dyshidrosis   . Family history of adverse reaction to anesthesia    daughter - nausea  . GERD (gastroesophageal reflux disease)   . Gout   . HLD (hyperlipidemia)   . HTN (hypertension)   . Hypothyroidism   . Neck mass   . Neck pain   . Osteoporosis   . Personal history of radiation therapy 12/2018  . Routine general medical examination at a health care facility   . Secondary hyperparathyroidism (of renal origin)   . Urinary incontinence   . Urinary tract infection     SURGICAL HISTORY: Past Surgical History:  Procedure Laterality Date  . bone density  08/21/05  . BREAST BIOPSY Right 03/30/2018   x3  . BREAST BIOPSY Right 04/25/2018  . BREAST LUMPECTOMY Right 06/07/2018  . BREAST LUMPECTOMY WITH RADIOACTIVE SEED AND AXILLARY LYMPH NODE DISSECTION Right 06/07/2018   Procedure: RIGHT BREAST LUMPECTOMY WITH BRACKETED RADIOACTIVE SEEDS AND RIGHT COMPLETE AXILLARY LYMPH NODE DISSECTION;  Surgeon: IFanny Skates MD;  [facility]  Service: General;  Laterality: Right;  . C-section (other)  1977  . CHOLECYSTECTOMY    . ELECTROCARDIOGRAM  02/01/06  . L ankle surgery Left 1988  . total left knee Left 2007    I have reviewed the social history and family history with the patient and they are unchanged from previous note.  ALLERGIES:  is allergic to ace inhibitors, azithromycin, pioglitazone, sulfonamide derivatives, and tramadol.  MEDICATIONS:  Current Outpatient Medications  Medication Sig Dispense Refill  . acetaminophen (TYLENOL) 325 MG tablet Take 1-2 tablets (325-650 mg total) by mouth every 4  (four) hours as needed for mild pain.    Marland Kitchen allopurinol (ZYLOPRIM) 100 MG tablet Take 100 mg by mouth daily.    Marland Kitchen aspirin EC 81 MG tablet Take 81 mg by mouth daily.    Marland Kitchen atorvastatin (LIPITOR) 20 MG tablet Take 1 tablet (20 mg total) by mouth daily. 30 tablet 0  . Calcium Carb-Cholecalciferol (CALCIUM 500 +D PO) Take 1 tablet by mouth daily.    . cetirizine (ZYRTEC) 10 MG tablet Take 10 mg by mouth daily.     . diclofenac sodium (VOLTAREN) 1 % GEL APPLY 2 GRAMS EXTERNALLY TO THE AFFECTED AREA FOUR TIMES DAILY 100 g 0  . Ferrous Sulfate (IRON) 325 (65 Fe) MG TABS Take 1 tablet (325 mg total) by mouth daily. 30 tablet 0  . furosemide (LASIX) 40 MG tablet Take 1 tablet (40 mg total) by mouth 2 (two) times daily. 60 tablet 6  . hydrALAZINE (APRESOLINE) 25 MG tablet Take 1 tablet (25 mg total) by mouth 3 (three) times daily. 90 tablet 6  . levothyroxine (SYNTHROID) 112 MCG tablet Take 1 tablet (112 mcg total) by mouth daily at 6 (six) AM. 30 tablet 1  . liothyronine (CYTOMEL) 5 MCG tablet Take 1 tablet (5 mcg total) by mouth daily. 30 tablet 0  . Magnesium Oxide 400 MG CAPS Take 1 capsule (400 mg total) by mouth in the morning and at bedtime. 60 capsule 6  . metoprolol tartrate (LOPRESSOR) 25 MG tablet Take 1 tablet (25 mg total) by mouth 2 (two) times daily. 60 tablet 0  . Multiple Vitamins-Minerals (MULTIVITAMIN WOMEN 50+ PO) Take 1 tablet by mouth daily.     Marland Kitchen omeprazole (PRILOSEC) 20 MG capsule TAKE 1 CAPSULE(20 MG) BY MOUTH DAILY 90 capsule 0  . Polyethyl Glycol-Propyl Glycol (SYSTANE) 0.4-0.3 % SOLN Place 1 drop into both eyes daily as needed (for dry eyes).    . polyethylene glycol (MIRALAX / GLYCOLAX) 17 g packet Take 17 g by mouth as needed.    . tamoxifen (NOLVADEX) 20 MG tablet TAKE 1 TABLET(20 MG) BY MOUTH DAILY 90 tablet 3  . VITAMIN D PO Take 1 tablet by mouth daily.     No current facility-administered medications for this visit.    PHYSICAL EXAMINATION: ECOG PERFORMANCE STATUS: 2 -  Symptomatic, <50% confined to bed  Vitals:   09/28/19 1130  BP: (!) 154/63  Pulse: 61  Resp: 18  Temp: 98.6 F (37 C)  SpO2: 99%   Filed Weights   09/28/19 1131  Weight: 219 lb 8 oz (99.6 kg)    GENERAL:alert, no distress and comfortable SKIN: no rash to exposed skin  EYES:  sclera clear LYMPH:  no palpable cervical, supraclavicular, or axillary lymphadenopathy LUNGS:  normal breathing effort HEART:  no lower extremity edema NEURO: alert & oriented x 3 with fluent speech Breast exam: s/p right lumpectomy and radiation. Scar tissue at the axillary incision. Right breast is hyperpigmented and firm throughout. No nipple inversion or palpable mass in either breast or axilla that I  could appreciate.  LABORATORY DATA:  I have reviewed the data as listed CBC Latest Ref Rng & Units 09/28/2019 03/30/2019 03/27/2019  WBC 4.0 - 10.5 K/uL 5.5 3.7(L) 3.6(L)  Hemoglobin 12.0 - 15.0 g/dL 10.9(L) 8.9(L) 8.6(L)  Hematocrit 36 - 46 % 34.2(L) 26.9(L) 25.5(L)  Platelets 150 - 400 K/uL 112(L) 126(L) 126(L)     CMP Latest Ref Rng & Units 09/28/2019 06/20/2019 06/09/2019  Glucose 70 - 99 mg/dL 155(H) 188(H) 185(H)  BUN 8 - 23 mg/dL 39(H) 28(H) 26  Creatinine 0.44 - 1.00 mg/dL 1.85(H) 1.62(H) 1.56(H)  Sodium 135 - 145 mmol/L 137 136 134  Potassium 3.5 - 5.1 mmol/L 4.2 3.9 4.0  Chloride 98 - 111 mmol/L 102 99 99  CO2 22 - 32 mmol/L 28 22 22   Calcium 8.9 - 10.3 mg/dL 10.2 9.9 10.3  Total Protein 6.5 - 8.1 g/dL 6.3(L) - -  Total Bilirubin 0.3 - 1.2 mg/dL 0.4 - -  Alkaline Phos 38 - 126 U/L 73 - -  AST 15 - 41 U/L 19 - -  ALT 0 - 44 U/L 13 - -      RADIOGRAPHIC STUDIES: I have personally reviewed the radiological images as listed and agreed with the findings in the report. No results found.   ASSESSMENT & PLAN: Stacy Leduc Armstrongis a 86 y.o.femalewith   1. Cancer of central right breast,invasive carcinoma,stageIB,pT2c(m)N2aM0, ER+/PR+, HER2-, GradeII -diagnosed in 03/2018. She  underwent right breastlumpectomyand axillary lymph node dissectionon 06/07/18. 7/14 positive lymph nodes.  -Dr. Burr Medico did not recommend adjuvant chemotherapy given her age,medical comorbidities, and lobular histology which is less sensitive to chemotherapy -She began adjuvant Tamoxifen in 06/2018 prior to radiation due to covid19-related delay. Plan to continue 5-10 years to reduce risk of distant cancer recurrence.  -to reduce local recurrence risk, she completed adjuvant radiation per Dr. Isidore Moos on 11/25/18. She tolerated moderately well with fatigue, skin hyperpigmentation and swelling, wears compression bra during the day.  -Ms. Pilling is clinically doing well. Breast exam shows expected post lumpectomy and post radiation changes. Labs unremarkable with stable chronic mild anemia and thrombocytopenia and CKD. CA 27.29 is pending. Mammogram from 05/2019 is negative. Overall no clinical concern for recurrence.  -She continues tamoxifen which she is tolerating well overall, no significant toxicities.  -continue breast cancer surveillance.  -Routine lab and f/u in 4-6 months; will order mammogram at next visit which will be due 05/2020.   2. Iron deficient anemiaand anemia of chronic disease secondary to CKD -Shewaspreviouslybeing treated withmonthly EPO injections by Dr. Darrelyn Hillock was stopped due topossibletumor growth.  -anemia improved lately, 10.9 today; up from 8.9 six months ago  03/2019: elevated ferritin, low TIBC, serum iron normal -on oral iron once daily  3. CKD, stage III, HTN, HL, DM -On Lasix, hydralazine, metoprolol, lipitor -Managed by PCP, nephrologist and cardiologist. -BG 155 today, she is off diabetic meds currently  -Cr fluctuates, trending up lately to 1.85 today. I recommend for her to call Dr. Justin Mend  4.Osteoporosis, OA  -Seen on DEXA from 03/2014 with lowest T-score of -2.6 in femoral neck.  -She ambulates with cane -DEXA on 11/2018 shows osteoporosis. On  tamoxifen which has bone strengthening qualities. Continue calcium and vitamin D while increasing weight bearing  -Repeat 11/2020  PLAN: -Labs, mammogram reviewed -No clinical concern for recurrence -Continue tamoxifen and breast cancer surveillance  -Patient to call Dr. Justin Mend for f/u due to worsening renal dysfunction -F/u cardiology in June as scheduled -Routine breast cancer surveillance lab and  office visit in 4-6 months  All questions were answered. The patient knows to call the clinic with any problems, questions or concerns. No barriers to learning were detected.     Alla Feeling, NP 09/28/19

## 2019-09-28 ENCOUNTER — Inpatient Hospital Stay: Payer: Medicare Other | Attending: Nurse Practitioner

## 2019-09-28 ENCOUNTER — Telehealth: Payer: Self-pay | Admitting: Nurse Practitioner

## 2019-09-28 ENCOUNTER — Other Ambulatory Visit: Payer: Self-pay

## 2019-09-28 ENCOUNTER — Encounter: Payer: Self-pay | Admitting: Nurse Practitioner

## 2019-09-28 ENCOUNTER — Inpatient Hospital Stay (HOSPITAL_BASED_OUTPATIENT_CLINIC_OR_DEPARTMENT_OTHER): Payer: Medicare Other | Admitting: Nurse Practitioner

## 2019-09-28 VITALS — BP 154/63 | HR 61 | Temp 98.6°F | Resp 18 | Wt 219.5 lb

## 2019-09-28 DIAGNOSIS — Z923 Personal history of irradiation: Secondary | ICD-10-CM | POA: Diagnosis not present

## 2019-09-28 DIAGNOSIS — I129 Hypertensive chronic kidney disease with stage 1 through stage 4 chronic kidney disease, or unspecified chronic kidney disease: Secondary | ICD-10-CM | POA: Diagnosis not present

## 2019-09-28 DIAGNOSIS — Z79810 Long term (current) use of selective estrogen receptor modulators (SERMs): Secondary | ICD-10-CM | POA: Diagnosis not present

## 2019-09-28 DIAGNOSIS — N183 Chronic kidney disease, stage 3 unspecified: Secondary | ICD-10-CM | POA: Insufficient documentation

## 2019-09-28 DIAGNOSIS — E1122 Type 2 diabetes mellitus with diabetic chronic kidney disease: Secondary | ICD-10-CM | POA: Insufficient documentation

## 2019-09-28 DIAGNOSIS — M810 Age-related osteoporosis without current pathological fracture: Secondary | ICD-10-CM | POA: Diagnosis not present

## 2019-09-28 DIAGNOSIS — C50111 Malignant neoplasm of central portion of right female breast: Secondary | ICD-10-CM

## 2019-09-28 DIAGNOSIS — Z170 Estrogen receptor positive status [ER+]: Secondary | ICD-10-CM

## 2019-09-28 LAB — CBC WITH DIFFERENTIAL (CANCER CENTER ONLY)
Abs Immature Granulocytes: 0.01 10*3/uL (ref 0.00–0.07)
Basophils Absolute: 0 10*3/uL (ref 0.0–0.1)
Basophils Relative: 1 %
Eosinophils Absolute: 0.3 10*3/uL (ref 0.0–0.5)
Eosinophils Relative: 5 %
HCT: 34.2 % — ABNORMAL LOW (ref 36.0–46.0)
Hemoglobin: 10.9 g/dL — ABNORMAL LOW (ref 12.0–15.0)
Immature Granulocytes: 0 %
Lymphocytes Relative: 26 %
Lymphs Abs: 1.4 10*3/uL (ref 0.7–4.0)
MCH: 30.9 pg (ref 26.0–34.0)
MCHC: 31.9 g/dL (ref 30.0–36.0)
MCV: 96.9 fL (ref 80.0–100.0)
Monocytes Absolute: 0.6 10*3/uL (ref 0.1–1.0)
Monocytes Relative: 11 %
Neutro Abs: 3.1 10*3/uL (ref 1.7–7.7)
Neutrophils Relative %: 57 %
Platelet Count: 112 10*3/uL — ABNORMAL LOW (ref 150–400)
RBC: 3.53 MIL/uL — ABNORMAL LOW (ref 3.87–5.11)
RDW: 12.1 % (ref 11.5–15.5)
WBC Count: 5.5 10*3/uL (ref 4.0–10.5)
nRBC: 0 % (ref 0.0–0.2)

## 2019-09-28 LAB — CMP (CANCER CENTER ONLY)
ALT: 13 U/L (ref 0–44)
AST: 19 U/L (ref 15–41)
Albumin: 3.4 g/dL — ABNORMAL LOW (ref 3.5–5.0)
Alkaline Phosphatase: 73 U/L (ref 38–126)
Anion gap: 7 (ref 5–15)
BUN: 39 mg/dL — ABNORMAL HIGH (ref 8–23)
CO2: 28 mmol/L (ref 22–32)
Calcium: 10.2 mg/dL (ref 8.9–10.3)
Chloride: 102 mmol/L (ref 98–111)
Creatinine: 1.85 mg/dL — ABNORMAL HIGH (ref 0.44–1.00)
GFR, Est AFR Am: 28 mL/min — ABNORMAL LOW (ref >60)
GFR, Estimated: 25 mL/min — ABNORMAL LOW (ref >60)
Glucose, Bld: 155 mg/dL — ABNORMAL HIGH (ref 70–99)
Potassium: 4.2 mmol/L (ref 3.5–5.1)
Sodium: 137 mmol/L (ref 135–145)
Total Bilirubin: 0.4 mg/dL (ref 0.3–1.2)
Total Protein: 6.3 g/dL — ABNORMAL LOW (ref 6.5–8.1)

## 2019-09-28 NOTE — Telephone Encounter (Signed)
Scheduled appt per 6/10 los.  Printed calendar and avs.

## 2019-09-29 LAB — CANCER ANTIGEN 27.29: CA 27.29: 31.5 U/mL (ref 0.0–38.6)

## 2019-10-17 NOTE — Progress Notes (Addendum)
Cardiology Office Note:    Date:  10/18/2019   ID:  Stacy Walls, DOB 1932/08/22, MRN [id number]  PCP:  Sonia Side., FNP  Cardiologist:  Candee Furbish, MD  Electrophysiologist:  None  Nephrologist:Dr. Justin Mend  Referring MD: Sonia Side., FNP   Chief Complaint:  Follow-up (CHF)    Patient Profile:    Stacy Walls is a 86 y.o. female with:   Presumed coronary artery disease  ? Myoview in 2007: inf ischemia >> no symptoms; CKD >> med Rx ? Coronary artery Ca2+ on CT in 10/6281  Chronic Diastolic CHF   Echocardiogram 02/2019: EF 60-65, Gr 1 DD, BAE, mod MR, RVSP 56  Aortic Atherosclerosis ? CT 05/15/2018  Diabetes mellitus   Hypertension   Hyperlipidemia   Chronic kidney disease   Breast CA s/p partial mastectomy in 05/2018; Radiation; Tamoxifen  Admitted 02/2019 - HypoNa, AKI, prolonged QT in setting of Levaquin for UTI  Prior CV studies: Echocardiogram 03/18/2019 EF 60-65, mild LVH, Gr 1 DD, normal RVSF, severe LAE, mild RAE, mild MAC, mod MR, trivial TR, trivial AI, trivial PI, mod elevated PASP (RVSP 56)   History of Present Illness:    Stacy Walls was last seen by Dr. Marlou Porch in 06/2019.  She returns for follow up.  She is here alone.  Her blood pressures at home have been 151V-616W systolic.  She has not had chest discomfort, syncope, orthopnea, significant leg swelling.  She does not have significant shortness of breath with most activities.  Past Medical History:  Diagnosis Date  . Acute cholecystitis   . Allergic rhinitis   . Anemia   . Arthritis   . Cancer [facility])    Right breast  . Chest pain   . Chronic renal insufficiency    followed by Dr Justin Mend stage 3  . Clostridium difficile colitis   . Constipation   . Coronary atherosclerosis of native coronary vessel   . Cough   . Depression    situational - husband died 05-15-2018  . Diabetes mellitus   . Dizziness   . DM2 (diabetes mellitus, type 2) ([place])   . Dyshidrosis   .  Family history of adverse reaction to anesthesia    daughter - nausea  . GERD (gastroesophageal reflux disease)   . Gout   . HLD (hyperlipidemia)   . HTN (hypertension)   . Hypothyroidism   . Neck mass   . Neck pain   . Osteoporosis   . Personal history of radiation therapy 12/2018  . Routine general medical examination at a health care facility   . Secondary hyperparathyroidism (of renal origin)   . Urinary incontinence   . Urinary tract infection     Current Medications: Current Meds  Medication Sig  . acetaminophen (TYLENOL) 325 MG tablet Take 1-2 tablets (325-650 mg total) by mouth every 4 (four) hours as needed for mild pain.  Marland Kitchen allopurinol (ZYLOPRIM) 100 MG tablet Take 100 mg by mouth daily.  Marland Kitchen aspirin EC 81 MG tablet Take 81 mg by mouth daily.  Marland Kitchen atorvastatin (LIPITOR) 20 MG tablet Take 1 tablet (20 mg total) by mouth daily.  . cetirizine (ZYRTEC) 10 MG tablet Take 10 mg by mouth daily.   . diclofenac sodium (VOLTAREN) 1 % GEL APPLY 2 GRAMS EXTERNALLY TO THE AFFECTED AREA FOUR TIMES DAILY  . Ferrous Sulfate (IRON) 325 (65 Fe) MG TABS Take 1 tablet (325 mg total) by mouth daily.  . furosemide (LASIX) 40 MG tablet  Take 1 tablet (40 mg total) by mouth 2 (two) times daily.  . hydrALAZINE (APRESOLINE) 25 MG tablet Take 25 mg by mouth in the morning and at bedtime.  Marland Kitchen levothyroxine (SYNTHROID) 112 MCG tablet Take 1 tablet (112 mcg total) by mouth daily at 6 (six) AM.  . liothyronine (CYTOMEL) 5 MCG tablet Take 1 tablet (5 mcg total) by mouth daily.  . Magnesium 400 MG TABS Take 400 mg by mouth daily.  . metoprolol tartrate (LOPRESSOR) 25 MG tablet Take 1 tablet (25 mg total) by mouth 2 (two) times daily.  . Multiple Vitamins-Minerals (MULTIVITAMIN WOMEN 50+ PO) Take 1 tablet by mouth daily.   Marland Kitchen omeprazole (PRILOSEC) 20 MG capsule TAKE 1 CAPSULE(20 MG) BY MOUTH DAILY  . Polyethyl Glycol-Propyl Glycol (SYSTANE) 0.4-0.3 % SOLN Place 1 drop into both eyes daily as needed (for dry  eyes).  . polyethylene glycol (MIRALAX / GLYCOLAX) 17 g packet Take 17 g by mouth as needed.  . tamoxifen (NOLVADEX) 20 MG tablet TAKE 1 TABLET(20 MG) BY MOUTH DAILY  . VITAMIN D PO Take 1 tablet by mouth daily.  . [DISCONTINUED] Magnesium 400 MG TABS Take 400 mg by mouth daily.     Allergies:   Ace inhibitors, Azithromycin, Pioglitazone, Sulfonamide derivatives, and Tramadol   Social History   Tobacco Use  . Smoking status: Never Smoker  . Smokeless tobacco: Never Used  Vaping Use  . Vaping Use: Never used  Substance Use Topics  . Alcohol use: No  . Drug use: No     Family Hx: The patient's family history includes Cancer in her brother; Coronary artery disease in her father; Diabetes in her brother, father, and sister; Stroke in her father and sister. There is no history of Breast cancer.  Review of Systems  Gastrointestinal: Negative for hematochezia and melena.     EKGs/Labs/Other Test Reviewed:    EKG:  EKG is not ordered today.  The ekg ordered today demonstrates n/a  Recent Labs: 03/20/2019: B Natriuretic Peptide 73.6; TSH 11.724 05/25/2019: NT-Pro BNP 3,487 06/20/2019: Magnesium 1.6 09/28/2019: ALT 13; BUN 39; Creatinine 1.85; Hemoglobin 10.9; Platelet Count 112; Potassium 4.2; Sodium 137   Recent Lipid Panel Lab Results  Component Value Date/Time   CHOL 260 (H) 09/07/2017 11:30 AM   TRIG 274.0 (H) 09/07/2017 11:30 AM   HDL 39.70 09/07/2017 11:30 AM   CHOLHDL 7 09/07/2017 11:30 AM   LDLCALC 86 04/01/2015 12:00 PM   LDLDIRECT 159.0 09/07/2017 11:30 AM    Physical Exam:    VS:  BP (!) 140/56   Pulse 68   Ht _0  (1.575 m)   Wt 217 lb 6.4 oz (98.6 kg)   SpO2 92%   BMI 39.76 kg/m     Wt Readings from Last 3 Encounters:  10/18/19 217 lb 6.4 oz (98.6 kg)  09/28/19 219 lb 8 oz (99.6 kg)  06/20/19 222 lb 3.2 oz (100.8 kg)     Constitutional:      Appearance: Healthy appearance. Not in distress.  Pulmonary:     Effort: Pulmonary effort is normal.      Breath sounds: No wheezing. No rales.  Cardiovascular:     Normal rate. Regular rhythm. Normal S1. Normal S2.     Murmurs: There is no murmur.  Edema:    Ankle: bilateral trace edema of the ankle. Abdominal:     Palpations: Abdomen is soft.  Musculoskeletal:     Cervical back: Neck supple. Skin:    General: Skin  is warm and dry.  Neurological:     Mental Status: Alert and oriented to person, place and time.     Cranial Nerves: Cranial nerves are intact.      ASSESSMENT & PLAN:    1. Chronic diastolic CHF (congestive heart failure) (HCC) Echocardiogram in 02/2019 demonstrated EF 60-65 with mild diastolic dysfunction and mod elevated PASP.    NYHA II-IIb.  Volume status stable.  We discussed the importance of daily weights and when to contact us for signs of excess fluid.  Continue current dose of furosemide.  Follow-up in 6 months.  2. Coronary artery disease involving native coronary artery of native heart without angina pectoris Prior CT with coronary calcification.  She had inferior ischemia on Myoview in 2007 but has been managed medically secondary to chronic kidney disease and lack of symptoms.  She is not currently having any chest symptoms to suggest angina.  Continue aspirin, atorvastatin.  I will obtain her most recent Lipid Panel from primary care.   3. Stage 3b chronic kidney disease She is followed chronically by Dr. Justin Mend with nephrology.  Most recent creatinine stable at 1.85.   4. Essential hypertension BP above goal.  Continue current dose of Hydralazine and Metoprolol  Add Amlodipine 5 mg once daily.      Dispo:  Return in about 6 months (around 04/18/2020) for Routine Follow Up w/ Dr. Marlou Porch, or [name], PA-C, in person.   Medication Adjustments/Labs and Tests Ordered: Current medicines are reviewed at length with the patient today.  Concerns regarding medicines are outlined above.  Tests Ordered: No orders of the defined types were placed in this  encounter.  Medication Changes: Meds ordered this encounter  Medications  . amLODipine (NORVASC) 5 MG tablet    Sig: Take 1 tablet (5 mg total) by mouth daily.    Dispense:  90 tablet    Refill:  3  . Magnesium 400 MG TABS    Sig: Take 400 mg by mouth daily.    Dispense:  90 tablet    Refill:  3    Signed, [name], PA-C  10/18/2019 10:42 AM    [address], [place], [place]  [address] Phone: [phone]; Fax: [phone]   Addendum: Labs from PCP 07/18/19 received: TC 176, HDL 57, Trig 160, LDL 93 K 4, BUN 32, SCr 1.80, ALT 11, Hgb 11.2 [name], PA-C    10/26/2019 7:44 AM

## 2019-10-18 ENCOUNTER — Telehealth: Payer: Self-pay | Admitting: Physician Assistant

## 2019-10-18 ENCOUNTER — Encounter: Payer: Self-pay | Admitting: Physician Assistant

## 2019-10-18 ENCOUNTER — Other Ambulatory Visit: Payer: Self-pay

## 2019-10-18 ENCOUNTER — Ambulatory Visit (INDEPENDENT_AMBULATORY_CARE_PROVIDER_SITE_OTHER): Payer: Medicare Other | Admitting: Physician Assistant

## 2019-10-18 VITALS — BP 140/56 | HR 68 | Ht 62.0 in | Wt 217.4 lb

## 2019-10-18 DIAGNOSIS — N1832 Chronic kidney disease, stage 3b: Secondary | ICD-10-CM

## 2019-10-18 DIAGNOSIS — I10 Essential (primary) hypertension: Secondary | ICD-10-CM | POA: Diagnosis not present

## 2019-10-18 DIAGNOSIS — I5032 Chronic diastolic (congestive) heart failure: Secondary | ICD-10-CM | POA: Diagnosis not present

## 2019-10-18 DIAGNOSIS — I2510 Atherosclerotic heart disease of native coronary artery without angina pectoris: Secondary | ICD-10-CM | POA: Diagnosis not present

## 2019-10-18 MED ORDER — AMLODIPINE BESYLATE 5 MG PO TABS
5.0000 mg | ORAL_TABLET | Freq: Every day | ORAL | 3 refills | Status: DC
Start: 2019-10-18 — End: 2019-10-19

## 2019-10-18 MED ORDER — MAGNESIUM 400 MG PO TABS: 400.0000 mg | ORAL_TABLET | Freq: Every day | ORAL | 3 refills | Status: DC

## 2019-10-18 NOTE — Telephone Encounter (Signed)
DC Amlodipine. Add Amlodipine to allergy list. Increase Hydralazine to 50 mg twice daily. [name], PA-C    10/18/2019 5:40 PM

## 2019-10-18 NOTE — Telephone Encounter (Signed)
New message   Pt c/o medication issue:  1. Name of Medication: amLODipine (NORVASC) 5 MG tablet  2. How are you currently taking this medication (dosage and times per day)? n/a  3. Are you having a reaction (difficulty breathing--STAT)? Yes   4. What is your medication issue? Primary care physician office states that they took her off medication due to being allergic to the medication. Please call to discuss.

## 2019-10-18 NOTE — Patient Instructions (Addendum)
Medication Instructions:  Your physician has recommended you make the following change in your medication:  1.  START Amlodipine 5 mg taking 1 tablet daily  *If you need a refill on your cardiac medications before your next appointment, please call your pharmacy*   Lab Work: None ordered  If you have labs (blood work) drawn today and your tests are completely normal, you will receive your results only by: Marland Kitchen MyChart Message (if you have MyChart) OR . A paper copy in the mail If you have any lab test that is abnormal or we need to change your treatment, we will call you to review the results.   Testing/Procedures: None ordered   Follow-Up: At [facility], you and your health needs are our priority.  As part of our continuing mission to provide you with exceptional heart care, we have created designated Provider Care Teams.  These Care Teams include your primary Cardiologist (physician) and Advanced Practice Providers (APPs -  Physician Assistants and Nurse Practitioners) who all work together to provide you with the care you need, when you need it.  We recommend signing up for the patient portal called "MyChart".  Sign up information is provided on this After Visit Summary.  MyChart is used to connect with patients for Virtual Visits (Telemedicine).  Patients are able to view lab/test results, encounter notes, upcoming appointments, etc.  Non-urgent messages can be sent to your provider as well.   To learn more about what you can do with MyChart, go to [email or web address].    Your next appointment:   6 month(s)  The format for your next appointment:   In Person  Provider:   You may see Candee Furbish, MD or one of the following Advanced Practice Providers on your designated Care Team:    Truitt Merle, NP  Cecilie Kicks, NP  Kathyrn Drown, NP    Other Instructions

## 2019-10-19 ENCOUNTER — Other Ambulatory Visit: Payer: Self-pay | Admitting: Physician Assistant

## 2019-10-19 NOTE — Telephone Encounter (Signed)
Ok.  Continue current medications.  No changes from me. Agree with obtaining BP readings over the next week. It would be good to check BP 1-2 times a day, every day. Send to me for review after complete. [name], PA-C    10/19/2019 9:35 AM

## 2019-10-19 NOTE — Telephone Encounter (Signed)
I spoke with patient and asked her to check BP twice daily for next week and call these readings to the office.  She reports she is taking hydralazine 50 mg twice daily

## 2019-10-19 NOTE — Telephone Encounter (Signed)
I spoke with Stacy Walls at [facility]. She reports patient was previously on Amlodipine but this was discontinued due to unfavorable reaction and  headache. I gave Stacy Walls information from [name], [name] and let her know I would contact patient with med changes. I spoke with patient. She has not picked up amlodipine.  I gave patient instructions from [name], [name]. Patient checked her hydralazine bottle and reports she is taking 50 mg twice daily.  She states BP was elevated at office visit yesterday but at home it is running good.  She has BP cuff at home.  I asked her to check BP  two hours after taking morning medications for the next 7 days and call readings to office.  I told her we would call her back if any changes recommended before then. I then called [name] and asked them to discontinue amlodipine.  I checked with [name] and most recent prescription filled for hydralazine was 100 mg twice daily.  This was ordered by Dr Dustin Folks.  Patient picked prescription up on May 15.  Previous prescriptions were for 50 mg twice daily in February and 25 mg three times daily in January

## 2019-10-26 ENCOUNTER — Telehealth: Payer: Self-pay | Admitting: Physician Assistant

## 2019-10-26 MED ORDER — ATORVASTATIN CALCIUM 40 MG PO TABS: 40.0000 mg | ORAL_TABLET | Freq: Every day | ORAL | 1 refills | Status: AC

## 2019-10-26 NOTE — Telephone Encounter (Signed)
The patient has been notified of the result and verbalized understanding.  All questions (if any) were answered. Patient will increase Atorvastatin to 40 mg once a day, RX sent to patients pharmacy. Patient will have PCP recheck labs at follow up. Mady Haagensen, [place] 10/26/2019 9:09 AM

## 2019-10-26 NOTE — Telephone Encounter (Signed)
Recent labs from PCP received and reviewed. LDL is 93 and goal is < 70. PLAN:  1. Increase Atorvastatin from 20 to 40 mg once daily  2. Schedule Lipids and LFTs in 3 mos. [name], PA-C    10/26/2019 7:46 AM

## 2019-10-31 ENCOUNTER — Telehealth: Payer: Self-pay | Admitting: Physician Assistant

## 2019-10-31 NOTE — Telephone Encounter (Signed)
ollow Up:   Pt was told to call back with her blood pressure readings for a week.   10-19-19- 138/70 A.M. P.M.- 112/56  10-20-19-  132/60 A.M.  P.M.- 138/79  10-21-19-  131/72 A.M.   P.M.- 115/71  10-22-19-  128/73 A.M.    P.M.-  124/74  10-23-19-   128/71 A.M.     P.M.-  116/60  10-24-19-     140/73 A.M.     P.M.-   120/69  10-25-19-      129/72 A.M.     P.M.     139/69

## 2019-11-01 NOTE — Telephone Encounter (Signed)
I called and spoke with patient, she is aware per Nicki Reaper that blood pressures are optimal and to continue current medications, patient verbalized understanding and thanked me for the call.

## 2019-11-01 NOTE — Telephone Encounter (Signed)
Most BPs optimal. Continue current medications. [name], PA-C    11/01/2019 4:03 PM

## 2020-01-30 NOTE — Progress Notes (Signed)
Cardiology Office Note   Date:  02/08/2020   ID:  Stacy Walls, DOB 15-Mar-1933, MRN [id number]  PCP:  Stacy Walls., FNP  Cardiologist:  Dr. Marlou Porch, MD  Chief Complaint  Patient presents with  . Follow-up   History of Present Illness: Stacy Walls is a 86 y.o. female who presents for follow-up, seen for Dr. Marlou Porch.  Stacy Walls has a history of presumed CAD given Myoview performed in 2007 showing inferior ischemia with no symptoms.  Given CKD medical management was recommended.  She underwent a CT Jun 09, 2018 which showed coronary calcifications.  She has a history of chronic diastolic CHF per echocardiogram 02/2019 with LVEF at 60 to 65% with G1 DD, moderate MR.  Also with a history of DM2, hypertension, HLD, breast CA status post partial mastectomy with radiation and tamoxifen therapy along with CKD followed by nephrology.  She was most recently seen by Stacy Dopp, PA 10/18/2019 for follow-up.  Home BPs were noted to be in the 130-140 mmHg range.  She had no other CV symptoms.  She appeared euvolemic on exam with no medication changes made.  Today [name] states that she is doing very well from a CV standpoint.  She has no complaints of shortness of breath, LE edema, orthopnea, dizziness or syncope.  Does report she will occasionally have mild, fleeting episodes of chest tightness which occur with walking.  She states episodes are only happening maybe once per month.  We discussed findings of coronary calcifications on prior CT however per Dr. Marlou Porch notes, reluctant to proceed with invasive testing given poor renal function.  She was recently seen by Dr. Justin Mend several weeks ago with a good report.  BP have been well within range.  HR at 55 therefore we discussed decreasing her Toprol at this time.  Past Medical History:  Diagnosis Date  . Acute cholecystitis   . Allergic rhinitis   . Anemia   . Arthritis   . Cancer [facility])    Right breast  . Chest  pain   . Chronic renal insufficiency    followed by Dr Justin Mend stage 3  . Clostridium difficile colitis   . Constipation   . Coronary atherosclerosis of native coronary vessel   . Cough   . Depression    situational - husband died June 09, 2018  . Diabetes mellitus   . Dizziness   . DM2 (diabetes mellitus, type 2) ([place])   . Dyshidrosis   . Family history of adverse reaction to anesthesia    daughter - nausea  . GERD (gastroesophageal reflux disease)   . Gout   . HLD (hyperlipidemia)   . HTN (hypertension)   . Hypothyroidism   . Neck mass   . Neck pain   . Osteoporosis   . Personal history of radiation therapy 12/2018  . Routine general medical examination at a health care facility   . Secondary hyperparathyroidism (of renal origin)   . Urinary incontinence   . Urinary tract infection     Past Surgical History:  Procedure Laterality Date  . bone density  08/21/05  . BREAST BIOPSY Right 03/30/2018   x3  . BREAST BIOPSY Right 04/25/2018  . BREAST LUMPECTOMY Right 06/07/2018  . BREAST LUMPECTOMY WITH RADIOACTIVE SEED AND AXILLARY LYMPH NODE DISSECTION Right 06/07/2018   Procedure: RIGHT BREAST LUMPECTOMY WITH BRACKETED RADIOACTIVE SEEDS AND RIGHT COMPLETE AXILLARY LYMPH NODE DISSECTION;  Surgeon: Stacy Skates, MD;  Location: [facility];  Service: General;  Laterality:  Right;  . C-section (other)  1977  . CHOLECYSTECTOMY    . ELECTROCARDIOGRAM  02/01/06  . L ankle surgery Left 1988  . total left knee Left 2007     Current Outpatient Medications  Medication Sig Dispense Refill  . acetaminophen (TYLENOL) 325 MG tablet Take 1-2 tablets (325-650 mg total) by mouth every 4 (four) hours as needed for mild pain.    Stacy Walls allopurinol (ZYLOPRIM) 100 MG tablet Take 100 mg by mouth daily.    Stacy Walls aspirin EC 81 MG tablet Take 81 mg by mouth daily.    Stacy Walls atorvastatin (LIPITOR) 40 MG tablet Take 1 tablet (40 mg total) by mouth daily. 90 tablet 1  . cetirizine (ZYRTEC) 10 MG tablet Take 10 mg by mouth  daily.     . Ferrous Sulfate (IRON) 325 (65 Fe) MG TABS Take 1 tablet (325 mg total) by mouth daily. 30 tablet 0  . furosemide (LASIX) 40 MG tablet Take 1 tablet (40 mg total) by mouth 2 (two) times daily. 180 tablet 3  . hydrALAZINE (APRESOLINE) 100 MG tablet Take 100 mg by mouth daily.    Stacy Walls levothyroxine (SYNTHROID) 88 MCG tablet Take 88 mcg by mouth daily.    Stacy Walls liothyronine (CYTOMEL) 5 MCG tablet Take 1 tablet (5 mcg total) by mouth daily. 30 tablet 0  . metoprolol tartrate (LOPRESSOR) 25 MG tablet Take 0.5 tablets (12.5 mg total) by mouth 2 (two) times daily. 90 tablet 3  . Multiple Vitamins-Minerals (MULTIVITAMIN WOMEN 50+ PO) Take 1 tablet by mouth daily.     Stacy Walls omeprazole (PRILOSEC) 20 MG capsule TAKE 1 CAPSULE(20 MG) BY MOUTH DAILY 90 capsule 0  . Polyethyl Glycol-Propyl Glycol (SYSTANE) 0.4-0.3 % SOLN Place 1 drop into both eyes daily as needed (for dry eyes).    . polyethylene glycol (MIRALAX / GLYCOLAX) 17 g packet Take 17 g by mouth as needed.    . tamoxifen (NOLVADEX) 20 MG tablet TAKE 1 TABLET(20 MG) BY MOUTH DAILY 90 tablet 3   No current facility-administered medications for this visit.    Allergies:   Ace inhibitors, Azithromycin, Pioglitazone, Sulfonamide derivatives, Tramadol, and Amlodipine    Social History:  The patient  reports that she has never smoked. She has never used smokeless tobacco. She reports that she does not drink alcohol and does not use drugs.   Family History:  The patient's family history includes Cancer in her brother; Coronary artery disease in her father; Diabetes in her brother, father, and sister; Stroke in her father and sister.    ROS:  Please see the history of present illness. Otherwise, review of systems are positive for none.  All other systems are reviewed and negative.    PHYSICAL EXAM: VS:  Pulse (!) 55   Ht 5\' 2"  (1.575 m)   Wt 215 lb (97.5 kg)   SpO2 96%   BMI 39.32 kg/m  , BMI Body mass index is 39.32 kg/m.   General: Well  developed, well nourished, NAD Lungs:Clear to ausculation bilaterally. No wheezes, rales, or rhonchi. Breathing is unlabored. Cardiovascular: RRR with S1 S2. No murmurs Extremities: No edema. Radial pulses 2+ bilaterally Neuro: Alert and oriented. No focal deficits. No facial asymmetry. MAE spontaneously. Psych: Responds to questions appropriately with normal affect.    EKG:  EKG is ordered today. The ekg ordered today demonstrates SB with HR 56bpm  Recent Labs: 03/20/2019: B Natriuretic Peptide 73.6; TSH 11.724 05/25/2019: NT-Pro BNP 3,487 06/20/2019: Magnesium 1.6 09/28/2019: ALT 13; BUN 39;  Creatinine 1.85; Hemoglobin 10.9; Platelet Count 112; Potassium 4.2; Sodium 137    Lipid Panel    Component Value Date/Time   CHOL 260 (H) 09/07/2017 1130   TRIG 274.0 (H) 09/07/2017 1130   HDL 39.70 09/07/2017 1130   CHOLHDL 7 09/07/2017 1130   VLDL 54.8 (H) 09/07/2017 1130   LDLCALC 86 04/01/2015 1200   LDLDIRECT 159.0 09/07/2017 1130    Wt Readings from Last 3 Encounters:  02/08/20 215 lb (97.5 kg)  10/18/19 217 lb 6.4 oz (98.6 kg)  09/28/19 219 lb 8 oz (99.6 kg)    Other studies Reviewed: Additional studies/ records that were reviewed today include:  Review of the above records demonstrates:   Echocardiogram 03/18/2019:  1. Left ventricular ejection fraction, by visual estimation, is 60 to  65%. The left ventricle has normal function. There is mildly increased  left ventricular hypertrophy.  2. Left ventricular diastolic parameters are consistent with Grade I  diastolic dysfunction (impaired relaxation).  3. Global right ventricle has normal systolic function.The right  ventricular size is normal. No increase in right ventricular wall  thickness.  4. Left atrial size was severely dilated.  5. Right atrial size was mildly dilated.  6. Mild mitral annular calcification.  7. The mitral valve is mildly calcified. Moderate mitral valve  regurgitation.  8. The tricuspid  valve is normal in structure. Tricuspid valve  regurgitation is trivial.  9. The aortic valve is normal in structure. Aortic valve regurgitation is  trivial.  10. The pulmonic valve was normal in structure. Pulmonic valve  regurgitation is trivial.  11. Moderately elevated pulmonary artery systolic pressure.  12. The inferior vena cava is normal in size with <50% respiratory  variability, suggesting right atrial pressure of 8 mmHg.   ASSESSMENT AND PLAN:  1.  Chronic diastolic CHF: -Echocardiogram 02/2019 with LVEF at 60 to 65% with mild diastolic dysfunction and moderatly elevated PASP -Appears euvolemic on exam -Continue current dosing of Lasix, recently seen by Dr. Justin Mend several weeks ago with stable report. -Continue current regimen  2.  Coronary artery calcifications per chest CT: -Coronary artery calcifications on chest CT. She had an inferior ischemia per Myoview stress test in 2007 however she was medically managed due to chronic CKD and lack of symptoms -Reports very infrequent, fleeting chest pressure which occurs occasionally with ambulation. Discussed findings as above.  We will continue to monitor symptoms for now given infrequent episodes however she is to contact us if episodes become more frequent as she may need further work-up with stress test -Continue ASA, atorvastatin  3.  CKD stage III: -Followed by Dr. Justin Mend with nephrology -Most recent creatinine, 1.85 on 09/28/19 -Obtain lab work  4.  Hypertension: -Stable,  -Continue hydralazine, metoprolol and amlodipine   Current medicines are reviewed at length with the patient today.  The patient does not have concerns regarding medicines.  The following changes have been made:  Decrease Toprol to 12.5mg  QD  Labs/ tests ordered today include: None   Orders Placed This Encounter  Procedures  . Basic metabolic panel  . EKG 12-Lead   Disposition:   FU with Dr. Marlou Porch in 6 months  Signed, Kathyrn Drown, NP    02/08/2020 1:04 PM    [address], [place], [place]  [address] Phone: [phone]; Fax: [phone]

## 2020-02-08 ENCOUNTER — Ambulatory Visit (INDEPENDENT_AMBULATORY_CARE_PROVIDER_SITE_OTHER): Payer: Medicare Other | Admitting: Cardiology

## 2020-02-08 ENCOUNTER — Other Ambulatory Visit: Payer: Self-pay

## 2020-02-08 ENCOUNTER — Encounter: Payer: Self-pay | Admitting: Cardiology

## 2020-02-08 VITALS — BP 114/52 | HR 55 | Ht 62.0 in | Wt 215.0 lb

## 2020-02-08 DIAGNOSIS — I10 Essential (primary) hypertension: Secondary | ICD-10-CM

## 2020-02-08 DIAGNOSIS — I5032 Chronic diastolic (congestive) heart failure: Secondary | ICD-10-CM | POA: Diagnosis not present

## 2020-02-08 DIAGNOSIS — N1832 Chronic kidney disease, stage 3b: Secondary | ICD-10-CM

## 2020-02-08 DIAGNOSIS — Z79899 Other long term (current) drug therapy: Secondary | ICD-10-CM

## 2020-02-08 DIAGNOSIS — I2510 Atherosclerotic heart disease of native coronary artery without angina pectoris: Secondary | ICD-10-CM

## 2020-02-08 LAB — BASIC METABOLIC PANEL
BUN/Creatinine Ratio: 20 (ref 12–28)
BUN: 35 mg/dL — ABNORMAL HIGH (ref 8–27)
CO2: 26 mmol/L (ref 20–29)
Calcium: 10 mg/dL (ref 8.7–10.3)
Chloride: 100 mmol/L (ref 96–106)
Creatinine, Ser: 1.78 mg/dL — ABNORMAL HIGH (ref 0.57–1.00)
GFR calc Af Amer: 30 mL/min/{1.73_m2} — ABNORMAL LOW (ref >59)
GFR calc non Af Amer: 26 mL/min/{1.73_m2} — ABNORMAL LOW (ref >59)
Glucose: 194 mg/dL — ABNORMAL HIGH (ref 65–99)
Potassium: 4.4 mmol/L (ref 3.5–5.2)
Sodium: 137 mmol/L (ref 134–144)

## 2020-02-08 MED ORDER — NITROGLYCERIN 0.4 MG SL SUBL: 0.4000 mg | SUBLINGUAL_TABLET | SUBLINGUAL | 5 refills | Status: DC | PRN

## 2020-02-08 MED ORDER — METOPROLOL TARTRATE 25 MG PO TABS: 12.5000 mg | ORAL_TABLET | Freq: Two times a day (BID) | ORAL | 3 refills | Status: AC

## 2020-02-08 NOTE — Telephone Encounter (Signed)
We can send 0.4mg  SL NTG tablets to pharmacy with instructions on use. We did review these during our visit as well.

## 2020-02-08 NOTE — Telephone Encounter (Signed)
Returned call to patient. Discussed nitro and sent rx to patients preferred pharmacy as requested. Patient thanked me for the call and will contact the office if any further questions or concerns.

## 2020-02-08 NOTE — Patient Instructions (Signed)
Medication Instructions:  1. We are decreasing the metoprolol tartrate (Lopressor) to 12.5 mg by mouth twice a day. It does not come in a 12.5 mg tablet size so you will cut the 25 mg tablets in half.   *If you need a refill on your cardiac medications before your next appointment, please call your pharmacy*   Lab Work: BMET today If you have labs (blood work) drawn today and your tests are completely normal, you will receive your results only by: Marland Kitchen MyChart Message (if you have MyChart) OR . A paper copy in the mail If you have any lab test that is abnormal or we need to change your treatment, we will call you to review the results.   Testing/Procedures: None   Follow-Up: At [facility], you and your health needs are our priority.  As part of our continuing mission to provide you with exceptional heart care, we have created designated Provider Care Teams.  These Care Teams include your primary Cardiologist (physician) and Advanced Practice Providers (APPs -  Physician Assistants and Nurse Practitioners) who all work together to provide you with the care you need, when you need it.  We recommend signing up for the patient portal called "MyChart".  Sign up information is provided on this After Visit Summary.  MyChart is used to connect with patients for Virtual Visits (Telemedicine).  Patients are able to view lab/test results, encounter notes, upcoming appointments, etc.  Non-urgent messages can be sent to your provider as well.   To learn more about what you can do with MyChart, go to [email or web address].    Your next appointment:   3 month(s)  The format for your next appointment:   In Person  Provider:   Candee Furbish, MD

## 2020-02-08 NOTE — Addendum Note (Signed)
Addended by: Juventino Slovak on: 02/08/2020 04:43 PM   Modules accepted: Orders

## 2020-02-08 NOTE — Telephone Encounter (Signed)
Pt calling stating that she was seen today by Kathyrn Drown, NP and that she put pt on nitroglycerin. This medication is not on pt's medication list and pt is requesting that it be sent to her pharmacy. Please address

## 2020-03-01 NOTE — Progress Notes (Signed)
Stacy Walls   [phone] Fax:([phone]) [phone]   Clinic Follow up Note   Patient Care Team: Sonia Side., FNP as PCP - General (Family Medicine) Jerline Pain, MD as PCP - Cardiology (Cardiology) Edrick Oh, MD (Nephrology) Maia Breslow, MD (Orthopedic Surgery) Newton Pigg, MD as Consulting Physician (Obstetrics and Gynecology) Truitt Merle, MD as Consulting Physician (Hematology) Fanny Skates, MD as Consulting Physician (General Surgery) Alla Feeling, NP as Nurse Practitioner (Nurse Practitioner) Eppie Gibson, MD as Attending Physician (Radiation Oncology)  Date of Service:  03/06/2020  CHIEF COMPLAINT: F/u of right breast cancer  SUMMARY OF ONCOLOGIC HISTORY: Oncology History Overview Note  Cancer Staging Cancer of central portion of right female breast [facility]) Staging form: Breast, AJCC 8th Edition - Clinical stage from 03/30/2018: Stage IB (cT1c(m), cN1, cM0, G2, ER+, PR+, HER2-) - Signed by Truitt Merle, MD on 04/08/2018 - Pathologic stage from 06/07/2018: Stage IB (pT2(m), pN2a, cM0, G2, ER+, PR+, HER2-) - Signed by Gardenia Phlegm, NP on 06/22/2018     Cancer of central portion of right female breast ([place])  01/13/2018 Mammogram   Diagnostic Mammogram 01/13/18  RECOMMENDATION: 1. Ultrasound-guided biopsies of both masses in the RIGHT breast identified by ultrasound at the 12 o'clock axis (7 cm from the nipple measuring 1.1 x 0.8 x 0.7 cm and 5 cm from the nipple measuring 1 x 0.8 x 0.9 cm respectively). 2. Ultrasound-guided biopsy of the most prominent lymph node in the RIGHT axilla. 3. Postprocedure mammogram to ensure that the masses correspond to the extent of the asymmetry/distortion seen on mammogram. Additional stereotactic biopsy may be needed if the clips do not approximate the anterior and posterior extents. 4. Depending on the location of the post biopsy clips, and if breast conservation surgery is considered,  stereotactic biopsy may be also needed for the coarse heterogeneous calcifications within the slightly outer RIGHT breast, measuring 8 mm extent, located 1.2 cm from the asymmetry/distortion.   03/30/2018 Cancer Staging   Staging form: Breast, AJCC 8th Edition - Clinical stage from 03/30/2018: Stage IB (cT1c(m), cN1, cM0, G2, ER+, PR+, HER2-) - Signed by Truitt Merle, MD on 04/08/2018   03/30/2018 Initial Biopsy   Diagnosis 03/30/18 1. Breast, right, needle core biopsy, 12 o'clock position, 5cm from nipple - INVASIVE LOBULAR CARCINOMA, GRADE 2. SEE NOTE. - LOBULAR CARCINOMA IN SITU, INTERMEDIATE NUCLEAR GRADE. 2. Breast, right, needle core biopsy, 12 o'clock position 7cfn - INVASIVE LOBULAR CARCINOMA, GRADE 2. SEE NOTE. 1 of 3 FINAL for Ransom, [name] ([id number]) Diagnosis(continued) - LOBULAR CARCINOMA IN SITU, INTERMEDIATE NUCLEAR GRADE. 3. Lymph node, needle/core biopsy, right axilla - METASTATIC LOBULAR CARCINOMA TO LYMPH NODE.   03/30/2018 Receptors her2   Results: IMMUNOHISTOCHEMICAL AND MORPHOMETRIC ANALYSIS PERFORMED MANUALLY The tumor cells are NEGATIVE for Her2 (1+). Estrogen Receptor: 100%, POSITIVE, STRONG STAINING INTENSITY Progesterone Receptor: 70%, POSITIVE, STRONG STAINING INTENSITY Proliferation Marker Ki67: 10%   04/08/2018 Initial Diagnosis   Cancer of central portion of right female breast ([place])   04/27/2018 Imaging   CT CAP W Contrast 04/27/18  IMPRESSION: 1. Right axillary and right subpectoral lymphadenopathy, suspicious for metastatic disease. PET-CT may prove helpful to further evaluate. 2. Cholelithiasis. 3. Colonic diverticulosis without diverticulitis. 4.  Aortic Atherosclerois (ICD10-170.0)   04/28/2018 Imaging   Bone Scan 04/28/18  IMPRESSION: No scintigraphic evidence skeletal metastasis. Severe degenerative uptake in the medial compartment RIGHT knee.   06/07/2018 Cancer Staging   Staging form: Breast, AJCC 8th Edition - Pathologic stage  from 06/07/2018: Stage IB (pT2(m), pN2a, cM0, G2, ER+, PR+, HER2-) - Signed by Gardenia Phlegm, NP on 06/22/2018   06/07/2018 Surgery   RIGHT BREAST LUMPECTOMY WITH BRACKETED RADIOACTIVE SEEDS AND RIGHT COMPLETE AXILLARY LYMPH NODE DISSECTION by Dr. Dalbert Batman 06/07/18    06/07/2018 Pathology Results   Diagnosis 06/07/18 1. Breast, lumpectomy, right with radioactive seeds - INVASIVE LOBULAR CARCINOMA, MULTIFOCAL, LARGER FOCUS IS 3 CM, NOTTINGHAM GRADE 2 OF 3. - MARGINS OF RESECTION ARE NOT INVOLVED (CLOSEST MARGIN: LESS THAN 1 MM, ANTERIOR). - BIOPSY SITE CHANGES. - SEE ONCOLOGY TABLE. 2. Lymph nodes, regional resection, right axillary contents - METASTATIC CARCINOMA PRESENT IN SEVEN OF FOURTEEN LYMPH NODES (7/14). 3. Breast, excision, inferior margin - BREAST PARENCHYMA, NEGATIVE FOR CARCINOMA.   06/2018 -  Anti-estrogen oral therapy   Tamoxifen 80m daily starting in March or April 2020.    10/13/2018 - 11/25/2018 Radiation Therapy   Adjuvant radiation to right breat per Dr. SIsidore Moos  05/31/2019 Survivorship   SCP delivered by LCira Rue NP       CURRENT THERAPY:  Tamoxifen 233mdaily starting in March or April 2020.   INTERVAL HISTORY:  Stacy Walls here for a follow up of right breast cancer. She was last seen by me 6 months ago and seen by NP Lacie in interim. She presents to the clinic alone. She notes she is doing well. She notes mild chest pain which has been managed by her cardiologist with Nitro. Pain has not resolved. She is tolerating Tamoxifen well. She notes her energy is fair. She is still able to take care of herself with ADLs, fixing food, and has help with cleaning. Her arthritis in her knees has improved.    REVIEW OF SYSTEMS:   Constitutional: Denies fevers, chills or abnormal weight loss Eyes: Denies blurriness of vision Ears, nose, mouth, throat, and face: Denies mucositis or sore throat Respiratory: Denies cough, dyspnea or  wheezes Cardiovascular: Denies palpitation, chest discomfort or lower extremity swelling Gastrointestinal:  Denies nausea, heartburn or change in bowel habits Skin: Denies abnormal skin rashes MSK: (+) Improved arthritis in knees Lymphatics: Denies new lymphadenopathy or easy bruising Neurological:Denies numbness, tingling or new weaknesses Behavioral/Psych: Mood is stable, no new changes  All other systems were reviewed with the patient and are negative.  MEDICAL HISTORY:  Past Medical History:  Diagnosis Date  . Acute cholecystitis   . Allergic rhinitis   . Anemia   . Arthritis   [facility]   [facility]  . Chest pain   . Chronic renal insufficiency    followed by Dr WeJustin Mendtage 3  . Clostridium difficile colitis   . Constipation   . Coronary atherosclerosis of native coronary vessel   . Cough   . Depression    situational - husband died 1/02-02-2019. Diabetes mellitus   . Dizziness   . [place]  . Dyshidrosis   . Family history of adverse reaction to anesthesia    daughter - nausea  . GERD (gastroesophageal reflux disease)   . Gout   . HLD (hyperlipidemia)   . HTN (hypertension)   . Hypothyroidism   . Neck mass   . Neck pain   . Osteoporosis   . Personal history of radiation therapy 12/2018  . Routine general medical examination at a health care facility   . Secondary hyperparathyroidism (of renal origin)   . Urinary incontinence   . Urinary tract infection  SURGICAL HISTORY: Past Surgical History:  Procedure Laterality Date  . bone density  08/21/05  . BREAST BIOPSY Right 03/30/2018   x3  . BREAST BIOPSY Right 04/25/2018  . BREAST LUMPECTOMY Right 06/07/2018  . BREAST LUMPECTOMY WITH RADIOACTIVE SEED AND AXILLARY LYMPH NODE DISSECTION Right 06/07/2018   Procedure: RIGHT BREAST LUMPECTOMY WITH BRACKETED RADIOACTIVE SEEDS AND RIGHT COMPLETE AXILLARY LYMPH NODE DISSECTION;  Surgeon: Fanny Skates, MD;  Location: [place];   Service: General;  Laterality: Right;  . C-section (other)  1977  . CHOLECYSTECTOMY    . ELECTROCARDIOGRAM  02/01/06  . L ankle surgery Left 1988  . total left knee Left 2007    I have reviewed the social history and family history with the patient and they are unchanged from previous note.  ALLERGIES:  is allergic to ace inhibitors, azithromycin, pioglitazone, sulfonamide derivatives, tramadol, and amlodipine.  MEDICATIONS:  Current Outpatient Medications  Medication Sig Dispense Refill  . acetaminophen (TYLENOL) 325 MG tablet Take 1-2 tablets (325-650 mg total) by mouth every 4 (four) hours as needed for mild pain.    Marland Kitchen allopurinol (ZYLOPRIM) 100 MG tablet Take 100 mg by mouth daily.    Marland Kitchen aspirin EC 81 MG tablet Take 81 mg by mouth daily.    Marland Kitchen atorvastatin (LIPITOR) 40 MG tablet Take 1 tablet (40 mg total) by mouth daily. 90 tablet 1  . cetirizine (ZYRTEC) 10 MG tablet Take 10 mg by mouth daily.     . Ferrous Sulfate (IRON) 325 (65 Fe) MG TABS Take 1 tablet (325 mg total) by mouth daily. 30 tablet 0  . furosemide (LASIX) 40 MG tablet Take 1 tablet (40 mg total) by mouth 2 (two) times daily. 180 tablet 3  . hydrALAZINE (APRESOLINE) 100 MG tablet Take 100 mg by mouth daily.    Marland Kitchen levothyroxine (SYNTHROID) 88 MCG tablet Take 88 mcg by mouth daily.    Marland Kitchen liothyronine (CYTOMEL) 5 MCG tablet Take 1 tablet (5 mcg total) by mouth daily. 30 tablet 0  . metoprolol tartrate (LOPRESSOR) 25 MG tablet Take 0.5 tablets (12.5 mg total) by mouth 2 (two) times daily. 90 tablet 3  . Multiple Vitamins-Minerals (MULTIVITAMIN WOMEN 50+ PO) Take 1 tablet by mouth daily.     . nitroGLYCERIN (NITROSTAT) 0.4 MG SL tablet Place 1 tablet (0.4 mg total) under the tongue every 5 (five) minutes as needed for chest pain. 25 tablet 5  . omeprazole (PRILOSEC) 20 MG capsule TAKE 1 CAPSULE(20 MG) BY MOUTH DAILY 90 capsule 0  . Polyethyl Glycol-Propyl Glycol (SYSTANE) 0.4-0.3 % SOLN Place 1 drop into both eyes daily as  needed (for dry eyes).    . polyethylene glycol (MIRALAX / GLYCOLAX) 17 g packet Take 17 g by mouth as needed.    . tamoxifen (NOLVADEX) 20 MG tablet TAKE 1 TABLET(20 MG) BY MOUTH DAILY 90 tablet 3   No current facility-administered medications for this visit.    PHYSICAL EXAMINATION: ECOG PERFORMANCE STATUS: 3 - Symptomatic, >50% confined to bed  Vitals:   03/06/20 1102  BP: (!) 146/80  Pulse: 63  Resp: 16  Temp: 98 F (36.7 C)   Filed Weights   03/06/20 1102  Weight: 215 lb 3.2 oz (97.6 kg)    GENERAL:alert, no distress and comfortable SKIN: skin color, texture, turgor are normal, no rashes or significant lesions EYES: normal, Conjunctiva are pink and non-injected, sclera clear  NECK: supple, thyroid normal size, non-tender, without nodularity LYMPH:  no palpable lymphadenopathy in the cervical, axillary  LUNGS: clear to auscultation and percussion with normal breathing effort HEART: regular rate & rhythm and no murmurs and no lower extremity edema ABDOMEN:abdomen soft, non-tender and normal bowel sounds Musculoskeletal:no cyanosis of digits and no clubbing  NEURO: alert & oriented x 3 with fluent speech, no focal motor/sensory deficits BREAST: S/p right lumpectomy with surgical incision healed well and firm breast tissue. Left breast exam normal  EXAM PERFORMED IN WHEELCHAIR   LABORATORY DATA:  I have reviewed the data as listed CBC Latest Ref Rng & Units 03/06/2020 09/28/2019 03/30/2019  WBC 4.0 - 10.5 K/uL 5.1 5.5 3.7(L)  Hemoglobin 12.0 - 15.0 g/dL 11.1(L) 10.9(L) 8.9(L)  Hematocrit 36 - 46 % 34.7(L) 34.2(L) 26.9(L)  Platelets 150 - 400 K/uL 89(L) 112(L) 126(L)     CMP Latest Ref Rng & Units 03/06/2020 02/08/2020 09/28/2019  Glucose 70 - 99 mg/dL 175(H) 194(H) 155(H)  BUN 8 - 23 mg/dL 43(H) 35(H) 39(H)  Creatinine 0.44 - 1.00 mg/dL 1.83(H) 1.78(H) 1.85(H)  Sodium 135 - 145 mmol/L 141 137 137  Potassium 3.5 - 5.1 mmol/L 4.0 4.4 4.2  Chloride 98 - 111 mmol/L 105  100 102  CO2 22 - 32 mmol/L 27 26 28   Calcium 8.9 - 10.3 mg/dL 9.7 10.0 10.2  Total Protein 6.5 - 8.1 g/dL 6.5 - 6.3(L)  Total Bilirubin 0.3 - 1.2 mg/dL 0.7 - 0.4  Alkaline Phos 38 - 126 U/L 65 - 73  AST 15 - 41 U/L 20 - 19  ALT 0 - 44 U/L 11 - 13      RADIOGRAPHIC STUDIES: I have personally reviewed the radiological images as listed and agreed with the findings in the report. No results found.   ASSESSMENT & PLAN:  Stacy Walls is a 86 y.o. female with    1. Cancer of central right breast,invasive carcinoma,stageIB,pT2c(m)N2aM0, ER+/PR+, HER2-, GradeII -She was diagnosed in 03/2018. She underwent right breastlumpectomyand axillary lymph node dissectionon 06/07/18. Her tumor was completely resected, negativemargins,but she had7/14 positive lymph nodes.  -Given her multiple positive LN she has high risk of recurrence. Standard treatment is adjuvant chemotherapy, but given her age,medical comorbidities, and lobular histology which is less sensitive to chemotherapy, I do not recommend chemotherapy.Patient agrees with the plan.  -She underwent breast radiation in 11/2018.  -She started Tamoxifen in March or April of 2020. She is tolerating well.  -From a breast cancer standpoint, she is clinically doing well. Lab reviewed, her CBC and CMP are within normal limits except Hg 11.1, plt 89K, BG 175, BUN 43, Cr 1.83. Her physical exam and her 05/2019 mammogram were unremarkable. There is no clinical concern for recurrence. -Continue surveillance. Next mammogram in 05/2020 -Continue Tamoxifen  -F/u in 6 months.   2. Iron deficient anemiaand anemia of chronic disease secondary to CKD -Shewaspreviouslybeing treated withmonthly EPO injections by Dr. Darrelyn Hillock was stopped due topossibletumor growth.  -She has not had IV iron before. On oral iron pill daily, continue -Anemia improved today with Hg 11.1 (03/06/20).   3. CKD, stage III, HTN, DM -On Lasix, Hyzaar,  Omeprazole, Januvia -Managed by PCP, nephrologist and cardiologist. -Stable.   4.Osteoporosis, OA  -Seen on DEXA from 03/2014 with lowest T-score of -2.6 in femoral neck. -12/08/18 DEXA shows T-score -3.1 at right femur neck.  -She is on Tamoxifen which can help her bone density. She is on Multivitamin. She can ask Dr Justin Mend about restarting Calcium and Vit D.  -She ambulates with cane.  -Plan to repeat DEXA in 2023.  5. Thrombocytopenia  -Plt has been mildly low since early 2020 and been slowly declining since.  -plt decreased to 89K today (03/06/20)  -I discussed this could be from her comorbidities or Tamoxifen. I encouraged her to watch for bleeding and bruising and avoid falls and injury.   PLAN: -Continue Tamoxifen  -Mammogram on 05/2020 -Lab and f/u in 6 months    No problem-specific Assessment & Plan notes found for this encounter.   Orders Placed This Encounter  Procedures  . MM DIAG BREAST TOMO BILATERAL    Standing Status:   Future    Standing Expiration Date:   03/06/2021    Order Specific Question:   Reason for Exam (SYMPTOM  OR DIAGNOSIS REQUIRED)    Answer:   screening    Order Specific Question:   Preferred imaging location?    Answer:   [facility]   All questions were answered. The patient knows to call the clinic with any problems, questions or concerns. No barriers to learning was detected. The total time spent in the appointment was 30 minutes.     Truitt Merle, MD 03/06/2020   I, Joslyn Devon, am acting as scribe for Truitt Merle, MD.   I have reviewed the above documentation for accuracy and completeness, and I agree with the above.

## 2020-03-06 ENCOUNTER — Inpatient Hospital Stay: Payer: Medicare Other

## 2020-03-06 ENCOUNTER — Telehealth: Payer: Self-pay | Admitting: Hematology

## 2020-03-06 ENCOUNTER — Other Ambulatory Visit: Payer: Self-pay

## 2020-03-06 ENCOUNTER — Inpatient Hospital Stay: Payer: Medicare Other | Attending: Hematology | Admitting: Hematology

## 2020-03-06 VITALS — BP 146/80 | HR 63 | Temp 98.0°F | Resp 16 | Ht 62.0 in | Wt 215.2 lb

## 2020-03-06 DIAGNOSIS — E785 Hyperlipidemia, unspecified: Secondary | ICD-10-CM | POA: Diagnosis not present

## 2020-03-06 DIAGNOSIS — E039 Hypothyroidism, unspecified: Secondary | ICD-10-CM | POA: Diagnosis not present

## 2020-03-06 DIAGNOSIS — L301 Dyshidrosis [pompholyx]: Secondary | ICD-10-CM | POA: Insufficient documentation

## 2020-03-06 DIAGNOSIS — C773 Secondary and unspecified malignant neoplasm of axilla and upper limb lymph nodes: Secondary | ICD-10-CM | POA: Insufficient documentation

## 2020-03-06 DIAGNOSIS — K219 Gastro-esophageal reflux disease without esophagitis: Secondary | ICD-10-CM | POA: Insufficient documentation

## 2020-03-06 DIAGNOSIS — D696 Thrombocytopenia, unspecified: Secondary | ICD-10-CM | POA: Insufficient documentation

## 2020-03-06 DIAGNOSIS — M1990 Unspecified osteoarthritis, unspecified site: Secondary | ICD-10-CM | POA: Diagnosis not present

## 2020-03-06 DIAGNOSIS — N183 Chronic kidney disease, stage 3 unspecified: Secondary | ICD-10-CM | POA: Diagnosis not present

## 2020-03-06 DIAGNOSIS — M109 Gout, unspecified: Secondary | ICD-10-CM | POA: Diagnosis not present

## 2020-03-06 DIAGNOSIS — D631 Anemia in chronic kidney disease: Secondary | ICD-10-CM | POA: Diagnosis not present

## 2020-03-06 DIAGNOSIS — Z79899 Other long term (current) drug therapy: Secondary | ICD-10-CM | POA: Insufficient documentation

## 2020-03-06 DIAGNOSIS — Z9221 Personal history of antineoplastic chemotherapy: Secondary | ICD-10-CM | POA: Insufficient documentation

## 2020-03-06 DIAGNOSIS — Z170 Estrogen receptor positive status [ER+]: Secondary | ICD-10-CM

## 2020-03-06 DIAGNOSIS — M810 Age-related osteoporosis without current pathological fracture: Secondary | ICD-10-CM | POA: Insufficient documentation

## 2020-03-06 DIAGNOSIS — E1122 Type 2 diabetes mellitus with diabetic chronic kidney disease: Secondary | ICD-10-CM | POA: Insufficient documentation

## 2020-03-06 DIAGNOSIS — Z79810 Long term (current) use of selective estrogen receptor modulators (SERMs): Secondary | ICD-10-CM | POA: Diagnosis not present

## 2020-03-06 DIAGNOSIS — I129 Hypertensive chronic kidney disease with stage 1 through stage 4 chronic kidney disease, or unspecified chronic kidney disease: Secondary | ICD-10-CM | POA: Diagnosis not present

## 2020-03-06 DIAGNOSIS — C50111 Malignant neoplasm of central portion of right female breast: Secondary | ICD-10-CM

## 2020-03-06 DIAGNOSIS — I2510 Atherosclerotic heart disease of native coronary artery without angina pectoris: Secondary | ICD-10-CM | POA: Insufficient documentation

## 2020-03-06 DIAGNOSIS — Z923 Personal history of irradiation: Secondary | ICD-10-CM | POA: Insufficient documentation

## 2020-03-06 LAB — CMP (CANCER CENTER ONLY)
ALT: 11 U/L (ref 0–44)
AST: 20 U/L (ref 15–41)
Albumin: 3.5 g/dL (ref 3.5–5.0)
Alkaline Phosphatase: 65 U/L (ref 38–126)
Anion gap: 9 (ref 5–15)
BUN: 43 mg/dL — ABNORMAL HIGH (ref 8–23)
CO2: 27 mmol/L (ref 22–32)
Calcium: 9.7 mg/dL (ref 8.9–10.3)
Chloride: 105 mmol/L (ref 98–111)
Creatinine: 1.83 mg/dL — ABNORMAL HIGH (ref 0.44–1.00)
GFR, Estimated: 27 mL/min — ABNORMAL LOW (ref >60)
Glucose, Bld: 175 mg/dL — ABNORMAL HIGH (ref 70–99)
Potassium: 4 mmol/L (ref 3.5–5.1)
Sodium: 141 mmol/L (ref 135–145)
Total Bilirubin: 0.7 mg/dL (ref 0.3–1.2)
Total Protein: 6.5 g/dL (ref 6.5–8.1)

## 2020-03-06 LAB — CBC WITH DIFFERENTIAL (CANCER CENTER ONLY)
Abs Immature Granulocytes: 0.02 10*3/uL (ref 0.00–0.07)
Basophils Absolute: 0 10*3/uL (ref 0.0–0.1)
Basophils Relative: 1 %
Eosinophils Absolute: 0.2 10*3/uL (ref 0.0–0.5)
Eosinophils Relative: 5 %
HCT: 34.7 % — ABNORMAL LOW (ref 36.0–46.0)
Hemoglobin: 11.1 g/dL — ABNORMAL LOW (ref 12.0–15.0)
Immature Granulocytes: 0 %
Lymphocytes Relative: 27 %
Lymphs Abs: 1.4 10*3/uL (ref 0.7–4.0)
MCH: 30.8 pg (ref 26.0–34.0)
MCHC: 32 g/dL (ref 30.0–36.0)
MCV: 96.4 fL (ref 80.0–100.0)
Monocytes Absolute: 0.5 10*3/uL (ref 0.1–1.0)
Monocytes Relative: 10 %
Neutro Abs: 2.9 10*3/uL (ref 1.7–7.7)
Neutrophils Relative %: 57 %
Platelet Count: 89 10*3/uL — ABNORMAL LOW (ref 150–400)
RBC: 3.6 MIL/uL — ABNORMAL LOW (ref 3.87–5.11)
RDW: 12.1 % (ref 11.5–15.5)
WBC Count: 5.1 10*3/uL (ref 4.0–10.5)
nRBC: 0 % (ref 0.0–0.2)

## 2020-03-06 NOTE — Telephone Encounter (Signed)
Scheduled per 11/17 los. Printed avs and calendar for pt °

## 2020-03-07 ENCOUNTER — Encounter: Payer: Self-pay | Admitting: Hematology

## 2020-03-07 LAB — CANCER ANTIGEN 27.29: CA 27.29: 24.2 U/mL (ref 0.0–38.6)

## 2020-04-06 ENCOUNTER — Other Ambulatory Visit: Payer: Self-pay | Admitting: Cardiology

## 2020-04-06 DIAGNOSIS — C50111 Malignant neoplasm of central portion of right female breast: Secondary | ICD-10-CM

## 2020-04-08 ENCOUNTER — Other Ambulatory Visit: Payer: Self-pay | Admitting: Hematology

## 2020-04-08 ENCOUNTER — Other Ambulatory Visit: Payer: Self-pay

## 2020-04-08 DIAGNOSIS — C50111 Malignant neoplasm of central portion of right female breast: Secondary | ICD-10-CM

## 2020-04-08 NOTE — Progress Notes (Signed)
Error

## 2020-05-13 ENCOUNTER — Ambulatory Visit: Payer: Medicare HMO | Admitting: Cardiology

## 2020-05-24 ENCOUNTER — Other Ambulatory Visit: Payer: Self-pay

## 2020-05-24 ENCOUNTER — Ambulatory Visit
Admission: RE | Admit: 2020-05-24 | Discharge: 2020-05-24 | Disposition: A | Discharge status: Home / Self Care | Payer: Medicare Other | Source: Ambulatory Visit | Attending: Hematology | Admitting: Hematology

## 2020-05-24 DIAGNOSIS — Z170 Estrogen receptor positive status [ER+]: Secondary | ICD-10-CM

## 2020-06-13 ENCOUNTER — Ambulatory Visit: Payer: Medicare HMO | Admitting: Cardiology

## 2020-07-25 ENCOUNTER — Ambulatory Visit (INDEPENDENT_AMBULATORY_CARE_PROVIDER_SITE_OTHER): Payer: Medicare HMO | Admitting: Cardiology

## 2020-07-25 ENCOUNTER — Encounter: Payer: Self-pay | Admitting: Cardiology

## 2020-07-25 ENCOUNTER — Other Ambulatory Visit: Payer: Self-pay

## 2020-07-25 VITALS — BP 140/64 | HR 68 | Ht 62.0 in | Wt 215.0 lb

## 2020-07-25 DIAGNOSIS — N1832 Chronic kidney disease, stage 3b: Secondary | ICD-10-CM

## 2020-07-25 DIAGNOSIS — I5032 Chronic diastolic (congestive) heart failure: Secondary | ICD-10-CM | POA: Diagnosis not present

## 2020-07-25 DIAGNOSIS — I10 Essential (primary) hypertension: Secondary | ICD-10-CM | POA: Diagnosis not present

## 2020-07-25 DIAGNOSIS — I2510 Atherosclerotic heart disease of native coronary artery without angina pectoris: Secondary | ICD-10-CM | POA: Diagnosis not present

## 2020-07-25 NOTE — Patient Instructions (Signed)
Medication Instructions:  The current medical regimen is effective;  continue present plan and medications.  *If you need a refill on your cardiac medications before your next appointment, please call your pharmacy*  Follow-Up: At [facility], you and your health needs are our priority.  As part of our continuing mission to provide you with exceptional heart care, we have created designated Provider Care Teams.  These Care Teams include your primary Cardiologist (physician) and Advanced Practice Providers (APPs -  Physician Assistants and Nurse Practitioners) who all work together to provide you with the care you need, when you need it.  We recommend signing up for the patient portal called "MyChart".  Sign up information is provided on this After Visit Summary.  MyChart is used to connect with patients for Virtual Visits (Telemedicine).  Patients are able to view lab/test results, encounter notes, upcoming appointments, etc.  Non-urgent messages can be sent to your provider as well.   To learn more about what you can do with MyChart, go to [email or web address].    Your next appointment:   12 month(s)  The format for your next appointment:   In Person  Provider:   Mark Skains, MD   Thank you for choosing [facility]!!      

## 2020-07-25 NOTE — Progress Notes (Signed)
Cardiology Office Note:    Date:  07/25/2020   ID:  Stacy Walls, DOB December 05, 1932, MRN [id number]  PCP:  Sonia Side., [facility]  Cardiologist:  Candee Furbish, MD  Advanced Practice Provider:  No care team member to display Electrophysiologist:  None       Referring MD: Sonia Side., FNP     History of Present Illness:    Stacy Walls is a 87 y.o. female here for the follow-up of coronary artery disease.  CT in 2020 showed coronary artery calcifications.  Poor renal function noted.  Dr. Justin Mend with nephrology has been seeing her.  Occasionally has mild fleeting episodes of chest tightness sometimes occurring with walking.  2020 lost husband to MI, she has her kids and grandkids supporting her.  Overall she is been doing quite well.  No chest pain no shortness of breath recently.  Feeling well.  Past Medical History:  Diagnosis Date  . Acute cholecystitis   . Allergic rhinitis   . Anemia   . Arthritis   . Cancer [facility])    Right breast  . Chest pain   . Chronic renal insufficiency    followed by Dr Justin Mend stage 3  . Clostridium difficile colitis   . Constipation   . Coronary atherosclerosis of native coronary vessel   . Cough   . Depression    situational - husband died 06-Jun-2018  . Diabetes mellitus   . Dizziness   . DM2 (diabetes mellitus, type 2) ([place])   . Dyshidrosis   . Family history of adverse reaction to anesthesia    daughter - nausea  . GERD (gastroesophageal reflux disease)   . Gout   . HLD (hyperlipidemia)   . HTN (hypertension)   . Hypothyroidism   . Neck mass   . Neck pain   . Osteoporosis   . Personal history of radiation therapy 12/2018  . Routine general medical examination at a health care facility   . Secondary hyperparathyroidism (of renal origin)   . Urinary incontinence   . Urinary tract infection     Past Surgical History:  Procedure Laterality Date  . bone density  08/21/05  .  BREAST BIOPSY Right 03/30/2018   x3  . BREAST BIOPSY Right 04/25/2018  . BREAST LUMPECTOMY Right 06/07/2018  . BREAST LUMPECTOMY WITH RADIOACTIVE SEED AND AXILLARY LYMPH NODE DISSECTION Right 06/07/2018   Procedure: RIGHT BREAST LUMPECTOMY WITH BRACKETED RADIOACTIVE SEEDS AND RIGHT COMPLETE AXILLARY LYMPH NODE DISSECTION;  Surgeon: Fanny Skates, MD;  Location: [facility];  Service: General;  Laterality: Right;  . C-section (other)  1977  . CHOLECYSTECTOMY    . ELECTROCARDIOGRAM  02/01/06  . L ankle surgery Left 1988  . total left knee Left 2007    Current Medications: Current Meds  Medication Sig  . acetaminophen (TYLENOL) 325 MG tablet Take 1-2 tablets (325-650 mg total) by mouth every 4 (four) hours as needed for mild pain.  Marland Kitchen allopurinol (ZYLOPRIM) 100 MG tablet Take 100 mg by mouth daily.  Marland Kitchen aspirin EC 81 MG tablet Take 81 mg by mouth daily.  Marland Kitchen atorvastatin (LIPITOR) 40 MG tablet Take 1 tablet (40 mg total) by mouth daily.  . cetirizine (ZYRTEC) 10 MG tablet Take 10 mg by mouth daily.   . Ferrous Sulfate (IRON) 325 (65 Fe) MG TABS Take 1 tablet (325 mg total) by mouth daily. (Patient taking differently: Take 325 mg by mouth daily.)  .  furosemide (LASIX) 40 MG tablet Take 1 tablet (40 mg total) by mouth 2 (two) times daily.  . hydrALAZINE (APRESOLINE) 100 MG tablet Take 100 mg by mouth daily.  Marland Kitchen levothyroxine (SYNTHROID) 88 MCG tablet Take 88 mcg by mouth daily.  Marland Kitchen liothyronine (CYTOMEL) 5 MCG tablet Take 1 tablet (5 mcg total) by mouth daily.  . metoprolol tartrate (LOPRESSOR) 25 MG tablet Take 0.5 tablets (12.5 mg total) by mouth 2 (two) times daily.  . Multiple Vitamins-Minerals (MULTIVITAMIN WOMEN 50+ PO) Take 1 tablet by mouth daily.   . nitroGLYCERIN (NITROSTAT) 0.4 MG SL tablet Place 1 tablet (0.4 mg total) under the tongue every 5 (five) minutes as needed for chest pain.  Marland Kitchen omeprazole (PRILOSEC) 20 MG capsule TAKE 1 CAPSULE(20 MG) BY MOUTH DAILY  . Polyethyl Glycol-Propyl Glycol  0.4-0.3 % SOLN Place 1 drop into both eyes daily as needed (for dry eyes).  . polyethylene glycol (MIRALAX / GLYCOLAX) 17 g packet Take 17 g by mouth as needed.  . tamoxifen (NOLVADEX) 20 MG tablet TAKE 1 TABLET(20 MG) BY MOUTH DAILY     Allergies:   Ace inhibitors, Azithromycin, Pioglitazone, Sulfonamide derivatives, Tramadol, and Amlodipine   Social History   Socioeconomic History  . Marital status: Widowed    Spouse name: Not on file  . Number of children: Not on file  . Years of education: Not on file  . Highest education level: Not on file  Occupational History  . Occupation: retired   Tobacco Use  . Smoking status: Never Smoker  . Smokeless tobacco: Never Used  Vaping Use  . Vaping Use: Never used  Substance and Sexual Activity  . Alcohol use: No  . Drug use: No  . Sexual activity: Not on file  Other Topics Concern  . Not on file  Social History Narrative   Lives with husband and has lived in [place] for over 30 years.    Social Determinants of Health   Financial Resource Strain: Not on file  Food Insecurity: Not on file  Transportation Needs: Not on file  Physical Activity: Not on file  Stress: Not on file  Social Connections: Not on file     Family History: The patient's family history includes Cancer in her brother; Coronary artery disease in her father; Diabetes in her brother, father, and sister; Stroke in her father and sister. There is no history of Breast cancer.  ROS:   Please see the history of present illness.     All other systems reviewed and are negative.  EKGs/Labs/Other Studies Reviewed:    The following studies were reviewed today:  Echocardiogram 03/18/2019: EF 60 to 65% mild LVH grade 1 diastolic dysfunction left atrium severely dilated, moderate mitral regurgitation.  Moderately elevated pulmonary pressures.   Recent Labs: 03/06/2020: ALT 11; BUN 43; Creatinine 1.83; Hemoglobin 11.1; Platelet Count 89; Potassium 4.0; Sodium 141   Recent Lipid Panel    Component Value Date/Time   CHOL 260 (H) 09/07/2017 1130   TRIG 274.0 (H) 09/07/2017 1130   HDL 39.70 09/07/2017 1130   CHOLHDL 7 09/07/2017 1130   VLDL 54.8 (H) 09/07/2017 1130   LDLCALC 86 04/01/2015 1200   LDLDIRECT 159.0 09/07/2017 1130     Risk Assessment/Calculations:      Physical Exam:    VS:  BP 140/64 (BP Location: Left Arm, Patient Position: Sitting, Cuff Size: Normal)   Pulse 68   Ht 5\' 2"  (1.575 m)   Wt 215 lb (97.5 kg)   SpO2  94%   BMI 39.32 kg/m     Wt Readings from Last 3 Encounters:  07/25/20 215 lb (97.5 kg)  03/06/20 215 lb 3.2 oz (97.6 kg)  02/08/20 215 lb (97.5 kg)     GEN:  Well nourished, well developed in no acute distress HEENT: Normal NECK: No JVD; No carotid bruits LYMPHATICS: No lymphadenopathy CARDIAC: RRR, no murmurs, rubs, gallops RESPIRATORY:  Clear to auscultation without rales, wheezing or rhonchi  ABDOMEN: Soft, non-tender, non-distended MUSCULOSKELETAL:  No edema; No deformity  SKIN: Warm and dry NEUROLOGIC:  Alert and oriented x 3 PSYCHIATRIC:  Normal affect   ASSESSMENT:    1. Chronic diastolic CHF (congestive heart failure) ([place])   2. Coronary artery disease involving native coronary artery of native heart without angina pectoris   3. Stage 3b chronic kidney disease ([place])   4. Essential hypertension    PLAN:    In order of problems listed above:  Chronic diastolic heart failure -Echo as above.  Fairly euvolemic on exam.  Continuing with Lasix.  Dr. Justin Mend with nephrology has been monitoring as well.  No changes made in regimen.   Coronary artery calcification noted on prior chest CT -Managed medically at this point given her chronic CKD.  Want to try to avoid contrast if possible. -Very infrequent fleeting chest discomfort with ambulation.  If symptoms worsen or become more worrisome we may need to proceed with repeat stress test or further work-up.  Now she is doing very well. -Continue with  aspirin as well as atorvastatin.  Chronic kidney disease stage IIIb -Creatinine has been 1.85.  Dr. Justin Mend.  Hypertension -Continuing with metoprolol amlodipine hydralazine.  We decreased her Toprol to 12.5 mg once a day at prior visit.      Medication Adjustments/Labs and Tests Ordered: Current medicines are reviewed at length with the patient today.  Concerns regarding medicines are outlined above.  No orders of the defined types were placed in this encounter.  No orders of the defined types were placed in this encounter.   Patient Instructions  Medication Instructions:  The current medical regimen is effective;  continue present plan and medications.  *If you need a refill on your cardiac medications before your next appointment, please call your pharmacy*  Follow-Up: At [facility], you and your health needs are our priority.  As part of our continuing mission to provide you with exceptional heart care, we have created designated Provider Care Teams.  These Care Teams include your primary Cardiologist (physician) and Advanced Practice Providers (APPs -  Physician Assistants and Nurse Practitioners) who all work together to provide you with the care you need, when you need it.  We recommend signing up for the patient portal called "MyChart".  Sign up information is provided on this After Visit Summary.  MyChart is used to connect with patients for Virtual Visits (Telemedicine).  Patients are able to view lab/test results, encounter notes, upcoming appointments, etc.  Non-urgent messages can be sent to your provider as well.   To learn more about what you can do with MyChart, go to [email or web address].    Your next appointment:   12 month(s)  The format for your next appointment:   In Person  Provider:   Candee Furbish, MD   Thank you for choosing [facility]!!        Signed, Candee Furbish, MD  07/25/2020 10:12 AM    [place]

## 2020-08-28 NOTE — Progress Notes (Signed)
[place]   Telephone:([phone]) [phone] Fax:(336) [phone]   Clinic Follow up Note   Patient Care Team: Sonia Side., FNP as PCP - General (Family Medicine) Jerline Pain, MD as PCP - Cardiology (Cardiology) Edrick Oh, MD (Nephrology) Maia Breslow, MD (Orthopedic Surgery) Newton Pigg, MD as Consulting Physician (Obstetrics and Gynecology) Truitt Merle, MD as Consulting Physician (Hematology) Fanny Skates, MD as Consulting Physician (General Surgery) Alla Feeling, NP as Nurse Practitioner (Nurse Practitioner) Eppie Gibson, MD as Attending Physician (Radiation Oncology)  Date of Service:  09/02/2020  CHIEF COMPLAINT: F/u of right breast cancer  SUMMARY OF ONCOLOGIC HISTORY: Oncology History Overview Note  Cancer Staging Cancer of central portion of right female breast [facility]) Staging form: Breast, AJCC 8th Edition - Clinical stage from 03/30/2018: Stage IB (cT1c(m), cN1, cM0, G2, ER+, PR+, HER2-) - Signed by Truitt Merle, MD on 04/08/2018 - Pathologic stage from 06/07/2018: Stage IB (pT2(m), pN2a, cM0, G2, ER+, PR+, HER2-) - Signed by Gardenia Phlegm, NP on 06/22/2018     Cancer of central portion of right female breast ([place])  01/13/2018 Mammogram   Diagnostic Mammogram 01/13/18  RECOMMENDATION: 1. Ultrasound-guided biopsies of both masses in the RIGHT breast identified by ultrasound at the 12 o'clock axis (7 cm from the nipple measuring 1.1 x 0.8 x 0.7 cm and 5 cm from the nipple measuring 1 x 0.8 x 0.9 cm respectively). 2. Ultrasound-guided biopsy of the most prominent lymph node in the RIGHT axilla. 3. Postprocedure mammogram to ensure that the masses correspond to the extent of the asymmetry/distortion seen on mammogram. Additional stereotactic biopsy may be needed if the clips do not approximate the anterior and posterior extents. 4. Depending on the location of the post biopsy clips, and if breast conservation surgery is considered,  stereotactic biopsy may be also needed for the coarse heterogeneous calcifications within the slightly outer RIGHT breast, measuring 8 mm extent, located 1.2 cm from the asymmetry/distortion.   03/30/2018 Cancer Staging   Staging form: Breast, AJCC 8th Edition - Clinical stage from 03/30/2018: Stage IB (cT1c(m), cN1, cM0, G2, ER+, PR+, HER2-) - Signed by Truitt Merle, MD on 04/08/2018   03/30/2018 Initial Biopsy   Diagnosis 03/30/18 1. Breast, right, needle core biopsy, 12 o'clock position, 5cm from nipple - INVASIVE LOBULAR CARCINOMA, GRADE 2. SEE NOTE. - LOBULAR CARCINOMA IN SITU, INTERMEDIATE NUCLEAR GRADE. 2. Breast, right, needle core biopsy, 12 o'clock position 7cfn - INVASIVE LOBULAR CARCINOMA, GRADE 2. SEE NOTE. 1 of 3 FINAL for Kovar, Shawnya R ([id number]) Diagnosis(continued) - LOBULAR CARCINOMA IN SITU, INTERMEDIATE NUCLEAR GRADE. 3. Lymph node, needle/core biopsy, right axilla - METASTATIC LOBULAR CARCINOMA TO LYMPH NODE.   03/30/2018 Receptors her2   Results: IMMUNOHISTOCHEMICAL AND MORPHOMETRIC ANALYSIS PERFORMED MANUALLY The tumor cells are NEGATIVE for Her2 (1+). Estrogen Receptor: 100%, POSITIVE, STRONG STAINING INTENSITY Progesterone Receptor: 70%, POSITIVE, STRONG STAINING INTENSITY Proliferation Marker Ki67: 10%   04/08/2018 Initial Diagnosis   Cancer of central portion of right female breast ([place])   04/27/2018 Imaging   CT CAP W Contrast 04/27/18  IMPRESSION: 1. Right axillary and right subpectoral lymphadenopathy, suspicious for metastatic disease. PET-CT may prove helpful to further evaluate. 2. Cholelithiasis. 3. Colonic diverticulosis without diverticulitis. 4.  Aortic Atherosclerois (ICD10-170.0)   04/28/2018 Imaging   Bone Scan 04/28/18  IMPRESSION: No scintigraphic evidence skeletal metastasis. Severe degenerative uptake in the medial compartment RIGHT knee.   06/07/2018 Cancer Staging   Staging form: Breast, AJCC 8th Edition - Pathologic stage  from 06/07/2018: Stage IB (pT2(m), pN2a, cM0, G2, ER+, PR+, HER2-) - Signed by Gardenia Phlegm, NP on 06/22/2018   06/07/2018 Surgery   RIGHT BREAST LUMPECTOMY WITH BRACKETED RADIOACTIVE SEEDS AND RIGHT COMPLETE AXILLARY LYMPH NODE DISSECTION by Dr. Dalbert Batman 06/07/18    06/07/2018 Pathology Results   Diagnosis 06/07/18 1. Breast, lumpectomy, right with radioactive seeds - INVASIVE LOBULAR CARCINOMA, MULTIFOCAL, LARGER FOCUS IS 3 CM, NOTTINGHAM GRADE 2 OF 3. - MARGINS OF RESECTION ARE NOT INVOLVED (CLOSEST MARGIN: LESS THAN 1 MM, ANTERIOR). - BIOPSY SITE CHANGES. - SEE ONCOLOGY TABLE. 2. Lymph nodes, regional resection, right axillary contents - METASTATIC CARCINOMA PRESENT IN SEVEN OF FOURTEEN LYMPH NODES (7/14). 3. Breast, excision, inferior margin - BREAST PARENCHYMA, NEGATIVE FOR CARCINOMA.   06/2018 -  Anti-estrogen oral therapy   Tamoxifen 2m daily starting in March or April 2020.    10/13/2018 - 11/25/2018 Radiation Therapy   Adjuvant radiation to right breat per Dr. SIsidore Moos  05/31/2019 Survivorship   SCP delivered by LCira Rue NP       CURRENT THERAPY:  Tamoxifen 243mdaily starting in March or April 2020.  INTERVAL HISTORY:  Stacy Walls here for a follow up of right breast cancer. She was last seen by me 6 months ago. She presents to the clinic alone. I reviewed her medication list with her. She notes she is on Glipizide 71m89maily for her DM. She is on Tamoxifen and tolerating well. She denies vaginal discharge or bleeding. She denies any new pain or new major changes, recent fall or fracture. She notes her LE edema is mild. She does note dots on her left lower arm, that she sees her dermatologist for.    REVIEW OF SYSTEMS:   Constitutional: Denies fevers, chills or abnormal weight loss Eyes: Denies blurriness of vision Ears, nose, mouth, throat, and face: Denies mucositis or sore throat Respiratory: Denies cough, dyspnea or wheezes Cardiovascular:  Denies palpitation, chest discomfort (+) lower extremity swelling Gastrointestinal:  Denies nausea, heartburn or change in bowel habits Skin: Denies abnormal skin rashes Lymphatics: Denies new lymphadenopathy or easy bruising Neurological:Denies numbness, tingling or new weaknesses Behavioral/Psych: Mood is stable, no new changes  All other systems were reviewed with the patient and are negative.  MEDICAL HISTORY:  Past Medical History:  Diagnosis Date  . Acute cholecystitis   . Allergic rhinitis   . Anemia   . Arthritis   . [facility]  [facility]  . Chest pain   . Chronic renal insufficiency    followed by Dr WebJustin Mendage 32  . Clostridium difficile colitis   . Constipation   . Coronary atherosclerosis of native coronary vessel   . Cough   . Depression    situational - husband died 1/22020/02/10 Diabetes mellitus   . Dizziness   . DM2 (diabetes mellitus, type 2) ([facility] . Dyshidrosis   . Family history of adverse reaction to anesthesia    daughter - nausea  . GERD (gastroesophageal reflux disease)   . Gout   . HLD (hyperlipidemia)   . HTN (hypertension)   . Hypothyroidism   . Neck mass   . Neck pain   . Osteoporosis   . Personal history of radiation therapy 12/2018  . Routine general medical examination at a health care facility   . Secondary hyperparathyroidism (of renal origin)   . Urinary incontinence   . Urinary tract infection     SURGICAL HISTORY: Past Surgical  History:  Procedure Laterality Date  . bone density  08/21/05  . BREAST BIOPSY Right 03/30/2018   x3  . BREAST BIOPSY Right 04/25/2018  . BREAST LUMPECTOMY Right 06/07/2018  . BREAST LUMPECTOMY WITH RADIOACTIVE SEED AND AXILLARY LYMPH NODE DISSECTION Right 06/07/2018   Procedure: RIGHT BREAST LUMPECTOMY WITH BRACKETED RADIOACTIVE SEEDS AND RIGHT COMPLETE AXILLARY LYMPH NODE DISSECTION;  Surgeon: Fanny Skates, MD;  Location: [facility];  Service: General;  Laterality: Right;  . C-section (other)  1977   . CHOLECYSTECTOMY    . ELECTROCARDIOGRAM  02/01/06  . L ankle surgery Left 1988  . total left knee Left 2007    I have reviewed the social history and family history with the patient and they are unchanged from previous note.  ALLERGIES:  is allergic to ace inhibitors, azithromycin, pioglitazone, sulfonamide derivatives, tramadol, and amlodipine.  MEDICATIONS:  Current Outpatient Medications  Medication Sig Dispense Refill  . acetaminophen (TYLENOL) 325 MG tablet Take 1-2 tablets (325-650 mg total) by mouth every 4 (four) hours as needed for mild pain.    Marland Kitchen allopurinol (ZYLOPRIM) 100 MG tablet Take 100 mg by mouth daily.    Marland Kitchen aspirin EC 81 MG tablet Take 81 mg by mouth daily.    Marland Kitchen atorvastatin (LIPITOR) 40 MG tablet Take 1 tablet (40 mg total) by mouth daily. 90 tablet 1  . cetirizine (ZYRTEC) 10 MG tablet Take 10 mg by mouth daily.     . Ferrous Sulfate (IRON) 325 (65 Fe) MG TABS Take 1 tablet (325 mg total) by mouth daily. (Patient taking differently: Take 325 mg by mouth daily.) 30 tablet 0  . furosemide (LASIX) 40 MG tablet Take 1 tablet (40 mg total) by mouth 2 (two) times daily. 180 tablet 3  . hydrALAZINE (APRESOLINE) 100 MG tablet Take 100 mg by mouth daily.    Marland Kitchen levothyroxine (SYNTHROID) 88 MCG tablet Take 88 mcg by mouth daily.    Marland Kitchen liothyronine (CYTOMEL) 5 MCG tablet Take 1 tablet (5 mcg total) by mouth daily. 30 tablet 0  . metoprolol tartrate (LOPRESSOR) 25 MG tablet Take 0.5 tablets (12.5 mg total) by mouth 2 (two) times daily. 90 tablet 3  . Multiple Vitamins-Minerals (MULTIVITAMIN WOMEN 50+ PO) Take 1 tablet by mouth daily.     . nitroGLYCERIN (NITROSTAT) 0.4 MG SL tablet Place 1 tablet (0.4 mg total) under the tongue every 5 (five) minutes as needed for chest pain. 25 tablet 5  . omeprazole (PRILOSEC) 20 MG capsule TAKE 1 CAPSULE(20 MG) BY MOUTH DAILY 90 capsule 0  . Polyethyl Glycol-Propyl Glycol 0.4-0.3 % SOLN Place 1 drop into both eyes daily as needed (for dry eyes).     . polyethylene glycol (MIRALAX / GLYCOLAX) 17 g packet Take 17 g by mouth as needed.    . tamoxifen (NOLVADEX) 20 MG tablet TAKE 1 TABLET(20 MG) BY MOUTH DAILY 90 tablet 3   No current facility-administered medications for this visit.    PHYSICAL EXAMINATION: ECOG PERFORMANCE STATUS: 1 - Symptomatic but completely ambulatory  Vitals:   09/02/20 1100  BP: (!) 134/59  Pulse: (!) 59  Resp: 17  Temp: 98.3 F (36.8 C)  SpO2: 100%   Filed Weights   09/02/20 1100  Weight: 218 lb 4.8 oz (99 kg)    GENERAL:alert, no distress and comfortable SKIN: skin color, texture, turgor are normal, no rashes or significant lesions EYES: normal, Conjunctiva are pink and non-injected, sclera clear  NECK: supple, thyroid normal size, non-tender, without nodularity LYMPH:  no palpable lymphadenopathy in the cervical, axillary  LUNGS: clear to auscultation and percussion with normal breathing effort HEART: regular rate & rhythm and no murmurs (+) lower extremity edema ABDOMEN:abdomen soft, non-tender and normal bowel sounds Musculoskeletal:no cyanosis of digits and no clubbing  NEURO: alert & oriented x 3 with fluent speech, no focal motor/sensory deficits BREAST: s/p right lumpectomy: surgical incision healed well. No palpable mass, nodules or adenopathy bilaterally. Breast exam benign.  EXAM PERFORMED IN WHEELCHAIR TODAY   LABORATORY DATA:  I have reviewed the data as listed CBC Latest Ref Rng & Units 09/02/2020 03/06/2020 09/28/2019  WBC 4.0 - 10.5 K/uL 5.8 5.1 5.5  Hemoglobin 12.0 - 15.0 g/dL 10.6(L) 11.1(L) 10.9(L)  Hematocrit 36.0 - 46.0 % 33.2(L) 34.7(L) 34.2(L)  Platelets 150 - 400 K/uL 105(L) 89(L) 112(L)     CMP Latest Ref Rng & Units 09/02/2020 03/06/2020 02/08/2020  Glucose 70 - 99 mg/dL 89 175(H) 194(H)  BUN 8 - 23 mg/dL 37(H) 43(H) 35(H)  Creatinine 0.44 - 1.00 mg/dL 1.75(H) 1.83(H) 1.78(H)  Sodium 135 - 145 mmol/L 141 141 137  Potassium 3.5 - 5.1 mmol/L 3.5 4.0 4.4  Chloride  98 - 111 mmol/L 107 105 100  CO2 22 - 32 mmol/L 27 27 26   Calcium 8.9 - 10.3 mg/dL 9.7 9.7 10.0  Total Protein 6.5 - 8.1 g/dL 6.1(L) 6.5 -  Total Bilirubin 0.3 - 1.2 mg/dL 0.5 0.7 -  Alkaline Phos 38 - 126 U/L 57 65 -  AST 15 - 41 U/L 23 20 -  ALT 0 - 44 U/L 13 11 -      RADIOGRAPHIC STUDIES: I have personally reviewed the radiological images as listed and agreed with the findings in the report. No results found.   ASSESSMENT & PLAN:  Mellissa Joanne Salah is a 87 y.o. female with    1. Cancer of central right breast,invasive carcinoma,stageIB,pT2c(m)N2aM0, ER+/PR+, HER2-, GradeII -She was diagnosed in 03/2018. She underwent right breastlumpectomyand axillary lymph node dissectionon 06/07/18. Her tumor was completely resected, negativemargins,but she had7/14 positive lymph nodes.  -Given hermultiplepositive LN she has high risk of recurrence. Standard treatment is adjuvant chemotherapy, but given her age,medical comorbidities, and lobular histology which is less sensitive to chemotherapy, I do not recommend chemotherapy.Patient agrees with the plan.  -She underwent breast radiation in 11/2018. -She started Tamoxifen in March or April of 2020. She is tolerating well.  -From a breast cancer standpoint, she is clinically doing well. Lab reviewed, her CBC and CMP are within normal limits except Hg 10.6, plt 105K, BUN 37, Cr 1.75, protein 6.1, albumin 3.3. Her physical exam and her 05/2020 mammogram were unremarkable. There is no clinical concern for recurrence. -Continue surveillance. Next mammogram in 05/2021 -Continue Tamoxifen  -F/u in 6 months.  2. Iron deficient anemiaand anemia of chronic disease secondary to CKD -Shewaspreviouslybeing treated withmonthly EPO injections by Dr. Darrelyn Hillock was stopped due topossibletumor growth.  -She has not had IV iron before. On oral iron pill daily, continue -Anemia remains mild.   3. CKD, stage IV, HTN, DM -On Lasix,  Hyzaar, Omeprazole, Januvia -Managed by PCP, nephrologist and cardiologist. -Stable with mild improvement.   4.Osteoporosis, OA (mainly in knees) -Seen on DEXA from 03/2014 with lowest T-score of -2.6 in femoral neck. 12/08/18 DEXA shows T-score -3.1 at right femur neck. -She is on Tamoxifen which can help her bone density. She is on Multivitamin. She can ask Dr Justin Mend about restarting Calcium and Vit D.  -She ambulates with cane. She  has had no recent fall or fracture.  -Plan to repeat DEXA in 11/2020  5. Thrombocytopenia  -Plt has been mildly low since early 2020 and been fluctuating since.  -I discussed this could be from her comorbidities or Tamoxifen. I encouraged her to watch for bleeding and bruising and avoid falls and injury.   PLAN: -Continue Tamoxifen  -DEXA in 11/2020 -Lab and f/u in 6 months   No problem-specific Assessment & Plan notes found for this encounter.   Orders Placed This Encounter  Procedures  . DG Bone Density    Standing Status:   Future    Standing Expiration Date:   09/02/2021    Order Specific Question:   Reason for Exam (SYMPTOM  OR DIAGNOSIS REQUIRED)    Answer:   screening    Order Specific Question:   Preferred imaging location?    Answer:   [facility]   All questions were answered. The patient knows to call the clinic with any problems, questions or concerns. No barriers to learning was detected. The total time spent in the appointment was 30 minutes.     Truitt Merle, MD 09/02/2020   I, Joslyn Devon, am acting as scribe for Truitt Merle, MD.   I have reviewed the above documentation for accuracy and completeness, and I agree with the above.

## 2020-09-02 ENCOUNTER — Other Ambulatory Visit: Payer: Self-pay

## 2020-09-02 ENCOUNTER — Inpatient Hospital Stay: Payer: Medicare HMO | Attending: Hematology | Admitting: Hematology

## 2020-09-02 ENCOUNTER — Inpatient Hospital Stay: Payer: Medicare HMO

## 2020-09-02 VITALS — BP 134/59 | HR 59 | Temp 98.3°F | Resp 17 | Wt 218.3 lb

## 2020-09-02 DIAGNOSIS — R600 Localized edema: Secondary | ICD-10-CM | POA: Insufficient documentation

## 2020-09-02 DIAGNOSIS — C50111 Malignant neoplasm of central portion of right female breast: Secondary | ICD-10-CM | POA: Insufficient documentation

## 2020-09-02 DIAGNOSIS — Z170 Estrogen receptor positive status [ER+]: Secondary | ICD-10-CM | POA: Insufficient documentation

## 2020-09-02 DIAGNOSIS — Z79810 Long term (current) use of selective estrogen receptor modulators (SERMs): Secondary | ICD-10-CM | POA: Insufficient documentation

## 2020-09-02 DIAGNOSIS — E119 Type 2 diabetes mellitus without complications: Secondary | ICD-10-CM | POA: Diagnosis not present

## 2020-09-02 DIAGNOSIS — E1122 Type 2 diabetes mellitus with diabetic chronic kidney disease: Secondary | ICD-10-CM | POA: Diagnosis not present

## 2020-09-02 DIAGNOSIS — Z923 Personal history of irradiation: Secondary | ICD-10-CM | POA: Diagnosis not present

## 2020-09-02 DIAGNOSIS — N189 Chronic kidney disease, unspecified: Secondary | ICD-10-CM | POA: Insufficient documentation

## 2020-09-02 DIAGNOSIS — E2839 Other primary ovarian failure: Secondary | ICD-10-CM | POA: Diagnosis not present

## 2020-09-02 DIAGNOSIS — C773 Secondary and unspecified malignant neoplasm of axilla and upper limb lymph nodes: Secondary | ICD-10-CM | POA: Diagnosis not present

## 2020-09-02 DIAGNOSIS — I129 Hypertensive chronic kidney disease with stage 1 through stage 4 chronic kidney disease, or unspecified chronic kidney disease: Secondary | ICD-10-CM | POA: Diagnosis not present

## 2020-09-02 LAB — CBC WITH DIFFERENTIAL (CANCER CENTER ONLY)
Abs Immature Granulocytes: 0.01 10*3/uL (ref 0.00–0.07)
Basophils Absolute: 0 10*3/uL (ref 0.0–0.1)
Basophils Relative: 1 %
Eosinophils Absolute: 0.2 10*3/uL (ref 0.0–0.5)
Eosinophils Relative: 4 %
HCT: 33.2 % — ABNORMAL LOW (ref 36.0–46.0)
Hemoglobin: 10.6 g/dL — ABNORMAL LOW (ref 12.0–15.0)
Immature Granulocytes: 0 %
Lymphocytes Relative: 31 %
Lymphs Abs: 1.8 10*3/uL (ref 0.7–4.0)
MCH: 30.5 pg (ref 26.0–34.0)
MCHC: 31.9 g/dL (ref 30.0–36.0)
MCV: 95.4 fL (ref 80.0–100.0)
Monocytes Absolute: 0.6 10*3/uL (ref 0.1–1.0)
Monocytes Relative: 10 %
Neutro Abs: 3.2 10*3/uL (ref 1.7–7.7)
Neutrophils Relative %: 54 %
Platelet Count: 105 10*3/uL — ABNORMAL LOW (ref 150–400)
RBC: 3.48 MIL/uL — ABNORMAL LOW (ref 3.87–5.11)
RDW: 12.2 % (ref 11.5–15.5)
WBC Count: 5.8 10*3/uL (ref 4.0–10.5)
nRBC: 0 % (ref 0.0–0.2)

## 2020-09-02 LAB — CMP (CANCER CENTER ONLY)
ALT: 13 U/L (ref 0–44)
AST: 23 U/L (ref 15–41)
Albumin: 3.3 g/dL — ABNORMAL LOW (ref 3.5–5.0)
Alkaline Phosphatase: 57 U/L (ref 38–126)
Anion gap: 7 (ref 5–15)
BUN: 37 mg/dL — ABNORMAL HIGH (ref 8–23)
CO2: 27 mmol/L (ref 22–32)
Calcium: 9.7 mg/dL (ref 8.9–10.3)
Chloride: 107 mmol/L (ref 98–111)
Creatinine: 1.75 mg/dL — ABNORMAL HIGH (ref 0.44–1.00)
GFR, Estimated: 28 mL/min — ABNORMAL LOW (ref >60)
Glucose, Bld: 89 mg/dL (ref 70–99)
Potassium: 3.5 mmol/L (ref 3.5–5.1)
Sodium: 141 mmol/L (ref 135–145)
Total Bilirubin: 0.5 mg/dL (ref 0.3–1.2)
Total Protein: 6.1 g/dL — ABNORMAL LOW (ref 6.5–8.1)

## 2020-09-03 ENCOUNTER — Encounter: Payer: Self-pay | Admitting: Hematology

## 2020-09-03 LAB — CANCER ANTIGEN 27.29: CA 27.29: 26.3 U/mL (ref 0.0–38.6)

## 2020-12-14 ENCOUNTER — Encounter (HOSPITAL_COMMUNITY): Payer: Self-pay | Admitting: Nephrology

## 2020-12-14 NOTE — Telephone Encounter (Signed)
This encounter was created in error - please disregard.

## 2020-12-18 ENCOUNTER — Other Ambulatory Visit: Payer: Self-pay | Admitting: Nurse Practitioner

## 2020-12-18 ENCOUNTER — Other Ambulatory Visit: Payer: Self-pay

## 2020-12-18 DIAGNOSIS — Z170 Estrogen receptor positive status [ER+]: Secondary | ICD-10-CM

## 2020-12-18 MED ORDER — FUROSEMIDE 40 MG PO TABS: 40.0000 mg | ORAL_TABLET | Freq: Two times a day (BID) | ORAL | 2 refills | Status: DC

## 2021-01-21 ENCOUNTER — Telehealth: Payer: Self-pay | Admitting: Cardiology

## 2021-01-21 NOTE — Telephone Encounter (Signed)
A user error has taken place: encounter opened in error, closed for administrative reasons.

## 2021-01-21 NOTE — Telephone Encounter (Signed)
Spoke with pt who reports having some SOB with actiivty for a few weeks now.  C/o dizziness with position changes-standing up, but resolves after sitting back down.  BP reported from this AM 160/85 was before medication.  It was in the 130/? Range after medications.  Wts are stable, edema stable and unchanged from her normal.  HR seems regular.  She reports having a little "funny feeling" of numbness in the left side of her chest that lasts a few seconds and resolves on it's own.  She saw her PCP recently who advised her to follow up with her cardiologist. Appt scheduled for eval with Dr Marlou Porch.  She will call back if any further concerns prior to scheduled appt.

## 2021-01-21 NOTE — Telephone Encounter (Signed)
Pt c/o Shortness Of Breath: STAT if SOB developed within the last 24 hours or pt is noticeably SOB on the phone  1. Are you currently SOB (can you hear that pt is SOB on the phone)? np  2. How long have you been experiencing SOB? Two weeks   3. Are you SOB when sitting or when up moving around? Moving around   4. Are you currently experiencing any other symptoms? Dizziness, discomfort on left side    STAT if patient feels like he/she is going to faint   Are you dizzy now? yes  Do you feel faint or have you passed out? No   Do you have any other symptoms? sob  Have you checked your HR and BP (record if available)? 160/85

## 2021-01-24 ENCOUNTER — Ambulatory Visit
Admission: RE | Admit: 2021-01-24 | Discharge: 2021-01-24 | Disposition: A | Discharge status: Home / Self Care | Payer: Medicare HMO | Source: Ambulatory Visit | Attending: Family Medicine | Admitting: Family Medicine

## 2021-01-24 ENCOUNTER — Other Ambulatory Visit: Payer: Self-pay | Admitting: Family Medicine

## 2021-01-24 DIAGNOSIS — R079 Chest pain, unspecified: Secondary | ICD-10-CM

## 2021-01-29 ENCOUNTER — Telehealth: Payer: Self-pay | Admitting: Hematology

## 2021-01-29 NOTE — Telephone Encounter (Signed)
Rescheduled per 11/16 template change, message was left with pt

## 2021-01-30 ENCOUNTER — Ambulatory Visit (INDEPENDENT_AMBULATORY_CARE_PROVIDER_SITE_OTHER): Payer: Medicare HMO | Admitting: Cardiology

## 2021-01-30 ENCOUNTER — Other Ambulatory Visit: Payer: Self-pay

## 2021-01-30 VITALS — BP 130/70 | HR 64 | Ht 62.0 in | Wt 213.0 lb

## 2021-01-30 DIAGNOSIS — R079 Chest pain, unspecified: Secondary | ICD-10-CM

## 2021-01-30 DIAGNOSIS — I2729 Other secondary pulmonary hypertension: Secondary | ICD-10-CM

## 2021-01-30 DIAGNOSIS — N1832 Chronic kidney disease, stage 3b: Secondary | ICD-10-CM

## 2021-01-30 DIAGNOSIS — I10 Essential (primary) hypertension: Secondary | ICD-10-CM

## 2021-01-30 DIAGNOSIS — I2510 Atherosclerotic heart disease of native coronary artery without angina pectoris: Secondary | ICD-10-CM | POA: Diagnosis not present

## 2021-01-30 DIAGNOSIS — I5032 Chronic diastolic (congestive) heart failure: Secondary | ICD-10-CM | POA: Diagnosis not present

## 2021-01-30 NOTE — Assessment & Plan Note (Signed)
Fairly atypical, right-sided and left-sided fleeting.  She does ambulate with a cane.  Likely musculoskeletal in etiology.  She was nervous because her husband in July 19, 2018 died of a heart attack.  Understandable.  Her EKG today is unremarkable.  Continue with excellent cardiac medications which include aspirin 81 mg atorvastatin 40 mg metoprolol 12.5 mg twice a day.  If symptoms worsen or become more worrisome, further cardiac testing may be warranted.  She will let us know.

## 2021-01-30 NOTE — Assessment & Plan Note (Signed)
Prior EF 65% NYHA class IIb type symptoms.  Continue with Lasix 40 mg twice a day.  Other medications reviewed.  Blood work is being checked by Dustin Folks.

## 2021-01-30 NOTE — Patient Instructions (Signed)
Medication Instructions:  The current medical regimen is effective;  continue present plan and medications.  *If you need a refill on your cardiac medications before your next appointment, please call your pharmacy*  Follow-Up: At [facility], you and your health needs are our priority.  As part of our continuing mission to provide you with exceptional heart care, we have created designated Provider Care Teams.  These Care Teams include your primary Cardiologist (physician) and Advanced Practice Providers (APPs -  Physician Assistants and Nurse Practitioners) who all work together to provide you with the care you need, when you need it.  We recommend signing up for the patient portal called "MyChart".  Sign up information is provided on this After Visit Summary.  MyChart is used to connect with patients for Virtual Visits (Telemedicine).  Patients are able to view lab/test results, encounter notes, upcoming appointments, etc.  Non-urgent messages can be sent to your provider as well.   To learn more about what you can do with MyChart, go to [email or web address].    Your next appointment:   1 year(s)  The format for your next appointment:   In Person  Provider:   Mark Skains, MD   Thank you for choosing [facility]!!    

## 2021-01-30 NOTE — Progress Notes (Signed)
Cardiology Office Note:    Date:  01/30/2021   ID:  Stacy Walls, DOB [birth year], MRN [id number]  PCP:  Sonia Side., FNP  Cardiologist:  Candee Furbish, MD  Electrophysiologist:  None   Referring MD: Sonia Side., FNP   History of Present Illness:    Stacy Walls is a 87 y.o. female here for follow-up for her shortness of breath and dizziness.  Hx of coronary artery disease, hypertension, diabetes, hyperlipidemia, chronic kidney disease, and breast cancer status post partial mastectomy in 2020 with radiation and tamoxifen. She had coronary artery calcification on CT scan and 2020 with aortic atherosclerosis as well.  .  Back in November 2020 she was admitted with weakness mental status changes and headache with hyponatremia and acute kidney injury.  She had prolonged QT on ECG after being treated with Levaquin for UTI.  HCTZ was stopped and her QT and sodium level improved.  After the hospital stay she went to inpatient rehab.  Her blood pressure was challenging to control.  BNP was elevated.  She was slowly getting stronger and stronger.  Today: Overall, she says has not been feeling good. She had a sinus infection but received medication for it and the infection has resolved. She is feeling much better and not as dizzy.  Every 2-3 days she feels pain in her chest that she describes as a "quiver".   Her primary doctor has been monitoring her blood pressure and she reports that it has been doing well. She is also compliant with her thyroid medicine.  She denies any palpitations. No headaches, syncope, orthopnea, or PND. Also has no lower extremity edema or exertional symptoms.  Past Medical History:  Diagnosis Date   Acute cholecystitis    Allergic rhinitis    Anemia    Arthritis    Cancer (HCC)    Right breast   Chest pain    Chronic renal insufficiency    followed by Dr Justin Mend stage 3   Clostridium difficile colitis    Constipation    Coronary  atherosclerosis of native coronary vessel    Cough    Depression    situational - husband died May 20, 2018   Diabetes mellitus    Dizziness    DM2 (diabetes mellitus, type 2) ([place])    Dyshidrosis    Family history of adverse reaction to anesthesia    daughter - nausea   GERD (gastroesophageal reflux disease)    Gout    HLD (hyperlipidemia)    HTN (hypertension)    Hypothyroidism    Neck mass    Neck pain    Osteoporosis    Personal history of radiation therapy 12/2018   Routine general medical examination at a health care facility    Secondary hyperparathyroidism (of renal origin)    Urinary incontinence    Urinary tract infection     Past Surgical History:  Procedure Laterality Date   bone density  08/21/05   BREAST BIOPSY Right 03/30/2018   x3   BREAST BIOPSY Right 04/25/2018   BREAST LUMPECTOMY Right 06/07/2018   BREAST LUMPECTOMY WITH RADIOACTIVE SEED AND AXILLARY LYMPH NODE DISSECTION Right 06/07/2018   Procedure: RIGHT BREAST LUMPECTOMY WITH BRACKETED RADIOACTIVE SEEDS AND RIGHT COMPLETE AXILLARY LYMPH NODE DISSECTION;  Surgeon: Fanny Skates, MD;  Location: [facility];  Service: General;  Laterality: Right;   C-section (other)  1977   CHOLECYSTECTOMY     ELECTROCARDIOGRAM  02/01/06   L ankle surgery Left 1988  total left knee Left 2007    Current Medications: Current Meds  Medication Sig   acetaminophen (TYLENOL) 325 MG tablet Take 1-2 tablets (325-650 mg total) by mouth every 4 (four) hours as needed for mild pain.   allopurinol (ZYLOPRIM) 100 MG tablet Take 100 mg by mouth daily.   aspirin EC 81 MG tablet Take 81 mg by mouth daily.   atorvastatin (LIPITOR) 40 MG tablet Take 1 tablet (40 mg total) by mouth daily.   cetirizine (ZYRTEC) 10 MG tablet Take 10 mg by mouth daily.    Ferrous Sulfate (IRON) 325 (65 Fe) MG TABS Take 1 tablet (325 mg total) by mouth daily.   furosemide (LASIX) 40 MG tablet Take 1 tablet (40 mg total) by mouth 2 (two) times daily.   glipiZIDE  (GLUCOTROL) 5 MG tablet Take 5 mg by mouth daily before breakfast.   hydrALAZINE (APRESOLINE) 100 MG tablet Take 100 mg by mouth daily.   levothyroxine (SYNTHROID) 88 MCG tablet Take 88 mcg by mouth daily.   liothyronine (CYTOMEL) 5 MCG tablet Take 1 tablet (5 mcg total) by mouth daily.   metoprolol tartrate (LOPRESSOR) 25 MG tablet Take 0.5 tablets (12.5 mg total) by mouth 2 (two) times daily.   Multiple Vitamins-Minerals (MULTIVITAMIN WOMEN 50+ PO) Take 1 tablet by mouth daily.    nitroGLYCERIN (NITROSTAT) 0.4 MG SL tablet Place 1 tablet (0.4 mg total) under the tongue every 5 (five) minutes as needed for chest pain.   omeprazole (PRILOSEC) 20 MG capsule TAKE 1 CAPSULE(20 MG) BY MOUTH DAILY   Polyethyl Glycol-Propyl Glycol 0.4-0.3 % SOLN Place 1 drop into both eyes daily as needed (for dry eyes).   polyethylene glycol (MIRALAX / GLYCOLAX) 17 g packet Take 17 g by mouth as needed.   tamoxifen (NOLVADEX) 20 MG tablet TAKE 1 TABLET(20 MG) BY MOUTH DAILY     Allergies:   Ace inhibitors, Azithromycin, Pioglitazone, Sulfonamide derivatives, Tramadol, and Amlodipine   Social History   Socioeconomic History   Marital status: Widowed    Spouse name: Not on file   Number of children: Not on file   Years of education: Not on file   Highest education level: Not on file  Occupational History   Occupation: retired   Tobacco Use   Smoking status: Never   Smokeless tobacco: Never  Vaping Use   Vaping Use: Never used  Substance and Sexual Activity   Alcohol use: No   Drug use: No   Sexual activity: Not on file  Other Topics Concern   Not on file  Social History Narrative   Lives with husband and has lived in [place] for over 30 years.    Social Determinants of Health   Financial Resource Strain: Not on file  Food Insecurity: Not on file  Transportation Needs: Not on file  Physical Activity: Not on file  Stress: Not on file  Social Connections: Not on file     Family History: The  patient's family history includes Cancer in her brother; Coronary artery disease in her father; Diabetes in her brother, father, and sister; Stroke in her father and sister. There is no history of Breast cancer.  ROS:   Please see the history of present illness.   (+) Chest pain   All other systems reviewed and are negative.  EKGs/Labs/Other Studies Reviewed:    The following studies were reviewed today: Echocardiogram 03/18/2019 1. Left ventricular ejection fraction, by visual estimation, is 60 to  65%. The left ventricle has  normal function. There is mildly increased  left ventricular hypertrophy.   2. Left ventricular diastolic parameters are consistent with Grade I  diastolic dysfunction (impaired relaxation).   3. Global right ventricle has normal systolic function.The right  ventricular size is normal. No increase in right ventricular wall  thickness.   4. Left atrial size was severely dilated.   5. Right atrial size was mildly dilated.   6. Mild mitral annular calcification.   7. The mitral valve is mildly calcified. Moderate mitral valve  regurgitation.   8. The tricuspid valve is normal in structure. Tricuspid valve  regurgitation is trivial.   9. The aortic valve is normal in structure. Aortic valve regurgitation is  trivial.  10. The pulmonic valve was normal in structure. Pulmonic valve  regurgitation is trivial.  11. Moderately elevated pulmonary artery systolic pressure.  12. The inferior vena cava is normal in size with <50% respiratory  variability, suggesting right atrial pressure of 8 mmHg.   EKG: EKG is personally reviewed and interpreted. 01/30/2021: Sinus rhythm Rate 64 bpm 03/18/2019: Sinus 75 no other changes.  Recent Labs: 09/02/2020: ALT 13; BUN 37; Creatinine 1.75; Hemoglobin 10.6; Platelet Count 105; Potassium 3.5; Sodium 141  Recent Lipid Panel    Component Value Date/Time   CHOL 260 (H) 09/07/2017 1130   TRIG 274.0 (H) 09/07/2017 1130   HDL  39.70 09/07/2017 1130   CHOLHDL 7 09/07/2017 1130   VLDL 54.8 (H) 09/07/2017 1130   LDLCALC 86 04/01/2015 1200   LDLDIRECT 159.0 09/07/2017 1130    Physical Exam:    VS:  BP 130/70 (BP Location: Left Arm, Patient Position: Sitting, Cuff Size: Normal)   Pulse 64   Ht 5\' 2"  (1.575 m)   Wt 213 lb (96.6 kg)   SpO2 96%   BMI 38.96 kg/m     Wt Readings from Last 3 Encounters:  01/30/21 213 lb (96.6 kg)  09/02/20 218 lb 4.8 oz (99 kg)  07/25/20 215 lb (97.5 kg)     GEN:  Well nourished, well developed in no acute distress HEENT: Normal NECK: No JVD; No carotid bruits LYMPHATICS: No lymphadenopathy CARDIAC: RRR, no murmurs, rubs, gallops RESPIRATORY:  Clear to auscultation without rales, wheezing or rhonchi  ABDOMEN: Soft, non-tender, non-distended MUSCULOSKELETAL: Minor ankle edema; No deformity  SKIN: Warm and dry NEUROLOGIC:  Alert and oriented x 3 PSYCHIATRIC:  Normal affect   ASSESSMENT:    1. Chronic diastolic CHF (congestive heart failure) ([place])   2. Essential hypertension   3. Coronary artery disease involving native coronary artery of native heart without angina pectoris   4. Chest pain of uncertain etiology   5. Chronic diastolic heart failure (HCC)   6. Other secondary pulmonary hypertension (HCC)   7. Stage 3b chronic kidney disease (HCC)     PLAN:    In order of problems listed above: Chest pain of uncertain etiology Fairly atypical, right-sided and left-sided fleeting.  She does ambulate with a cane.  Likely musculoskeletal in etiology.  She was nervous because her husband in Jul 25, 2018 died of a heart attack.  Understandable.  Her EKG today is unremarkable.  Continue with excellent cardiac medications which include aspirin 81 mg atorvastatin 40 mg metoprolol 12.5 mg twice a day.  If symptoms worsen or become more worrisome, further cardiac testing may be warranted.  She will let us know.  Chronic diastolic heart failure (HCC) Prior EF 65% NYHA class IIb type  symptoms.  Continue with Lasix 40 mg twice a  day.  Other medications reviewed.  Blood work is being checked by Dustin Folks.  Other secondary pulmonary hypertension (HCC) From diastolic dysfunction.  No further testing.  Noted on echocardiogram.   CKD (chronic kidney disease), stage III ([place]) Dr. Justin Mend with nephrology had been following.  CAD (coronary artery disease) Prior coronary calcification noted.  Stress test back in 2007 showed possible inferior ischemia but this was managed medically secondary to lack of symptoms at the time.  Rare atypical chest discomfort.  Continue with aggressive medical management which we are currently pursuing.  No medicine changes today.   Follow up: 12 months  Medication Adjustments/Labs and Tests Ordered: Current medicines are reviewed at length with the patient today.  Concerns regarding medicines are outlined above.  Orders Placed This Encounter  Procedures   EKG 12-Lead    No orders of the defined types were placed in this encounter.   Patient Instructions  Medication Instructions:  The current medical regimen is effective;  continue present plan and medications.  *If you need a refill on your cardiac medications before your next appointment, please call your pharmacy*  Follow-Up: At [facility], you and your health needs are our priority.  As part of our continuing mission to provide you with exceptional heart care, we have created designated Provider Care Teams.  These Care Teams include your primary Cardiologist (physician) and Advanced Practice Providers (APPs -  Physician Assistants and Nurse Practitioners) who all work together to provide you with the care you need, when you need it.  We recommend signing up for the patient portal called "MyChart".  Sign up information is provided on this After Visit Summary.  MyChart is used to connect with patients for Virtual Visits (Telemedicine).  Patients are able to view lab/test results, encounter  notes, upcoming appointments, etc.  Non-urgent messages can be sent to your provider as well.   To learn more about what you can do with MyChart, go to [email or web address].    Your next appointment:   1 year(s)  The format for your next appointment:   In Person  Provider:   Candee Furbish, MD   Thank you for choosing [facility]!!     Wilhemina Bonito as a scribe for Candee Furbish, MD.,have documented all relevant documentation on the behalf of Candee Furbish, MD,as directed by  Candee Furbish, MD while in the presence of Candee Furbish, MD.  I, Candee Furbish, MD, have reviewed all documentation for this visit. The documentation on 01/30/21 for the exam, diagnosis, procedures, and orders are all accurate and complete.   Signed, Candee Furbish, MD  01/30/2021 12:33 PM    [place]

## 2021-01-30 NOTE — Assessment & Plan Note (Signed)
From diastolic dysfunction.  No further testing.  Noted on echocardiogram.

## 2021-01-30 NOTE — Assessment & Plan Note (Signed)
Prior coronary calcification noted.  Stress test back in 2007 showed possible inferior ischemia but this was managed medically secondary to lack of symptoms at the time.  Rare atypical chest discomfort.  Continue with aggressive medical management which we are currently pursuing.  No medicine changes today.

## 2021-01-30 NOTE — Assessment & Plan Note (Signed)
Dr. Justin Mend with nephrology had been following.

## 2021-02-10 ENCOUNTER — Encounter (HOSPITAL_COMMUNITY): Payer: Self-pay | Admitting: Nephrology

## 2021-02-14 ENCOUNTER — Ambulatory Visit (INDEPENDENT_AMBULATORY_CARE_PROVIDER_SITE_OTHER): Payer: Medicare HMO | Admitting: Podiatry

## 2021-02-14 ENCOUNTER — Encounter: Payer: Self-pay | Admitting: Podiatry

## 2021-02-14 ENCOUNTER — Other Ambulatory Visit: Payer: Self-pay

## 2021-02-14 DIAGNOSIS — E1122 Type 2 diabetes mellitus with diabetic chronic kidney disease: Secondary | ICD-10-CM | POA: Diagnosis not present

## 2021-02-14 DIAGNOSIS — N1832 Chronic kidney disease, stage 3b: Secondary | ICD-10-CM | POA: Diagnosis not present

## 2021-02-14 DIAGNOSIS — M79674 Pain in right toe(s): Secondary | ICD-10-CM

## 2021-02-14 DIAGNOSIS — E119 Type 2 diabetes mellitus without complications: Secondary | ICD-10-CM | POA: Diagnosis not present

## 2021-02-14 DIAGNOSIS — M2142 Flat foot [pes planus] (acquired), left foot: Secondary | ICD-10-CM

## 2021-02-14 DIAGNOSIS — B351 Tinea unguium: Secondary | ICD-10-CM

## 2021-02-14 DIAGNOSIS — M79675 Pain in left toe(s): Secondary | ICD-10-CM

## 2021-02-14 DIAGNOSIS — M2141 Flat foot [pes planus] (acquired), right foot: Secondary | ICD-10-CM | POA: Diagnosis not present

## 2021-02-14 NOTE — Patient Instructions (Signed)
Diabetes Mellitus and Foot Care Foot care is an important part of your health, especially when you have diabetes. Diabetes may cause you to have problems because of poor blood flow (circulation) to your feet and legs, which can cause your skin to: Become thinner and drier. Break more easily. Heal more slowly. Peel and crack. You may also have nerve damage (neuropathy) in your legs and feet, causing decreased feeling in them. This means that you may not notice minor injuries to your feet that could lead to more serious problems. Noticing and addressing any potential problems early is the best way to prevent future foot problems. How to care for your feet Foot hygiene  Wash your feet daily with warm water and mild soap. Do not use hot water. Then, pat your feet and the areas between your toes until they are completely dry. Do not soak your feet as this can dry your skin. Trim your toenails straight across. Do not dig under them or around the cuticle. File the edges of your nails with an emery board or nail file. Apply a moisturizing lotion or petroleum jelly to the skin on your feet and to dry, brittle toenails. Use lotion that does not contain alcohol and is unscented. Do not apply lotion between your toes. Shoes and socks Wear clean socks or stockings every day. Make sure they are not too tight. Do not wear knee-high stockings since they may decrease blood flow to your legs. Wear shoes that fit properly and have enough cushioning. Always look in your shoes before you put them on to be sure there are no objects inside. To break in new shoes, wear them for just a few hours a day. This prevents injuries on your feet. Wounds, scrapes, corns, and calluses  Check your feet daily for blisters, cuts, bruises, sores, and redness. If you cannot see the bottom of your feet, use a mirror or ask someone for help. Do not cut corns or calluses or try to remove them with medicine. If you find a minor scrape,  cut, or break in the skin on your feet, keep it and the skin around it clean and dry. You may clean these areas with mild soap and water. Do not clean the area with peroxide, alcohol, or iodine. If you have a wound, scrape, corn, or callus on your foot, look at it several times a day to make sure it is healing and not infected. Check for: Redness, swelling, or pain. Fluid or blood. Warmth. Pus or a bad smell. General tips Do not cross your legs. This may decrease blood flow to your feet. Do not use heating pads or hot water bottles on your feet. They may burn your skin. If you have lost feeling in your feet or legs, you may not know this is happening until it is too late. Protect your feet from hot and cold by wearing shoes, such as at the beach or on hot pavement. Schedule a complete foot exam at least once a year (annually) or more often if you have foot problems. Report any cuts, sores, or bruises to your health care provider immediately. Where to find more information American Diabetes Association: www.diabetes.org Association of Diabetes Care & Education Specialists: www.diabeteseducator.org Contact a health care provider if: You have a medical condition that increases your risk of infection and you have any cuts, sores, or bruises on your feet. You have an injury that is not healing. You have redness on your legs or feet. You   feel burning or tingling in your legs or feet. You have pain or cramps in your legs and feet. Your legs or feet are numb. Your feet always feel cold. You have pain around any toenails. Get help right away if: You have a wound, scrape, corn, or callus on your foot and: You have pain, swelling, or redness that gets worse. You have fluid or blood coming from the wound, scrape, corn, or callus. Your wound, scrape, corn, or callus feels warm to the touch. You have pus or a bad smell coming from the wound, scrape, corn, or callus. You have a fever. You have a red  line going up your leg. Summary Check your feet every day for blisters, cuts, bruises, sores, and redness. Apply a moisturizing lotion or petroleum jelly to the skin on your feet and to dry, brittle toenails. Wear shoes that fit properly and have enough cushioning. If you have foot problems, report any cuts, sores, or bruises to your health care provider immediately. Schedule a complete foot exam at least once a year (annually) or more often if you have foot problems. This information is not intended to replace advice given to you by your health care provider. Make sure you discuss any questions you have with your health care provider. Document Revised: 10/26/2019 Document Reviewed: 10/26/2019 Elsevier Patient Education  2022 [place], Adult Flat feet is a common condition in which there is no curve, or arch, on the inner sides of the feet. Normally, an adult foot has an arch. The arch creates a gap between the foot and the ground. This condition can occur in one foot or in both feet. What are the causes? This condition may be caused by: An injury to tendons and ligaments in the foot, such as to the tendon that supports the arch (posterior tibial tendon). Tendons are tissues that connect muscle to bone, and ligaments are tissues that connect bones to each other. Loose tendons or ligaments in your foot. A wearing down of your arch over time. Injury to bones in your foot. An abnormality in the bones of your foot called tarsal coalition. This happens when two or more bones in the foot are joined together (fused). Tarsal coalition is often present at birth, but signs of it typically do not show until the early teen years. What increases the risk? The following factors may make you more likely to develop this condition: Being an adult age 54 or older. Having a family history of flat feet or a history of childhood flexible flatfoot. Having obesity, diabetes, or high blood  pressure. Taking part in high-impact sports. Having inflammatory arthritis. Having had any of these problems with the bones in your foot: A broken bone (fracture). Bones that were moved out of place (dislocated). Achilles tendon injuries. What are the signs or symptoms? Symptoms of this condition include: Pain or tightness along the bottom of your foot. Foot pain that gets worse with activity. Swelling of the inner side of your foot or swelling of your ankle. Pain on the outer side of your ankle. Changes in gait. Your gait is the way that you walk. Pronation. This is when the foot and ankle lean inward when you are standing. Bony bumps on the top or inner side of your foot. How is this diagnosed? This condition is diagnosed with a physical exam of your foot and ankle. Your health care provider may also: Check your shoes for patterns of wear on the soles. Order  imaging tests, such as X-rays to see the bone structure. Refer you to a health care provider who specializes in feet (podiatrist). How is this treated? This condition may be treated with: Rest and ice to decrease inflammation and pain in the affected foot. Stretching or physical therapy exercises. These are done to: Improve movement and strength in your foot. Increase range of motion and relieve pain. A shoe insert (orthotic insert) for one foot or both feet. This helps to support the arch of your foot. An orthotic insert or inserts can be purchased from a store or can be custom-made. Wearing shoes with appropriate arch support. This is especially important for athletes. Medicines. These may be prescribed or injected into the affected foot to relieve pain. An ankle boot or cast or a foot or leg brace to relieve pressure on your affected foot. Surgery. This may be done to improve the alignment of your foot. Surgery is needed only if your posterior tibial tendon is torn or if you have tarsal coalition. Follow these instructions at  home: Managing pain, stiffness, and swelling If directed, put ice on the painful area. To do this: If you have a removable brace or boot, remove it as told by your health care provider. Put ice in a plastic bag. Place a towel between your skin and the bag or between your cast and the bag. Leave the ice on for 20 minutes, 2-3 times a day. Remove the ice if your skin turns bright red. This is very important. If you cannot feel pain, heat, or cold, you have a greater risk of damage to the area. Move your toes often to reduce stiffness and swelling. Raise (elevate) the injured area above the level of your heart while you are sitting or lying down. Activity Do exercises as told by your health care provider, if they were prescribed. If an activity causes pain, avoid it or try to find another activity that does not cause pain. General instructions Wear an orthotic insert and appropriate shoes as told by your health care provider. Take over-the-counter and prescription medicines only as told by your health care provider. Wear a brace, boot, or cast as told by your health care provider. Keep all follow-up visits. This is important. How is this prevented? To prevent the condition from getting worse: Wear comfortable, supportive shoes that are appropriate for your activities. Maintain a healthy weight. Stay active in a way that your health care provider recommends. This will help to keep your feet flexible and strong. Manage long-term (chronic) health conditions, such as diabetes, high blood pressure, and inflammatory arthritis. Work with a health care provider if you have concerns about your feet or shoes. Contact a health care provider if you have: Pain in your foot or lower leg that gets worse or does not improve with medicine. Pain or trouble when walking. Problems with your orthotic insert. Summary Flat feet is a common condition in which there is no curve, or arch, on the inner sides of the  feet. This condition can occur in one foot or in both feet. Your health care provider may recommend a shoe insert (orthotic insert) for one foot or both feet, or shoes with the appropriate arch support. Other treatments may include stretching or physical therapy exercises, pain medicines, and wearing a foot or leg brace or an ankle boot or cast. Surgery may be done if you have a tear in the tendon that supports your arch (posterior tibial tendon) or if two or  more of your foot bones were joined together, or fused,before birth (tarsal coalition). This information is not intended to replace advice given to you by your health care provider. Make sure you discuss any questions you have with your health care provider. Document Revised: 09/14/2019 Document Reviewed: 09/14/2019 Elsevier Patient Education  2022 [place].   Edema Edema is an abnormal buildup of fluids in the body tissues and under the skin. Swelling of the legs, feet, and ankles is a common symptom that becomes more likely as you get older. Swelling is also common in looser tissues, like around the eyes. When the affected area is squeezed, the fluid may move out of that spot and leave a dent for a few moments. This dent is called pitting edema. There are many possible causes of edema. Eating too much salt (sodium) and being on your feet or sitting for a long time can cause edema in your legs, feet, and ankles. Hot weather may make edema worse. Common causes of edema include: Heart failure. Liver or kidney disease. Weak leg blood vessels. Cancer. An injury. Pregnancy. Medicines. Being obese. Low protein levels in the blood. Edema is usually painless. Your skin may look swollen or shiny. Follow these instructions at home: Keep the affected body part raised (elevated) above the level of your heart when you are sitting or lying down. Do not sit still or stand for long periods of time. Do not wear tight clothing. Do not wear garters  on your upper legs. Exercise your legs to get your circulation going. This helps to move the fluid back into your blood vessels, and it may help the swelling go down. Wear elastic bandages or support stockings to reduce swelling as told by your health care provider. Eat a low-salt (low-sodium) diet to reduce fluid as told by your health care provider. Depending on the cause of your swelling, you may need to limit how much fluid you drink (fluid restriction). Take over-the-counter and prescription medicines only as told by your health care provider. Contact a health care provider if: Your edema does not get better with treatment. You have heart, liver, or kidney disease and have symptoms of edema. You have sudden and unexplained weight gain. Get help right away if: You develop shortness of breath or chest pain. You cannot breathe when you lie down. You develop pain, redness, or warmth in the swollen areas. You have heart, liver, or kidney disease and suddenly get edema. You have a fever and your symptoms suddenly get worse. Summary Edema is an abnormal buildup of fluids in the body tissues and under the skin. Eating too much salt (sodium) and being on your feet or sitting for a long time can cause edema in your legs, feet, and ankles. Keep the affected body part raised (elevated) above the level of your heart when you are sitting or lying down. This information is not intended to replace advice given to you by your health care provider. Make sure you discuss any questions you have with your health care provider. Document Revised: 02/13/2020 Document Reviewed: 01/30/2020 Elsevier Patient Education  2022 [employer].

## 2021-02-14 NOTE — Progress Notes (Signed)
Subjective: Stacy Walls presents today referred by Sonia Side., FNP for diabetic foot evaluation.  Patient relates 25 year history of diabetes.  Patient denies any history of foot wounds.  Patient denies any symptoms of numbness in feet.  Patient denies any symptoms of tingling in feet.  Patient denies any symptoms of burning in feet.  Patient denies any symptoms of pins/needles sensations in feet.  Patient relates blood glucose was 127 mg/dl yesterday morning.  PCP is Sonia Side., FNP , and last visit was 01/23/2021.  Today, patient c/o of painful, discolored, thick toenails which interfere with daily activities.  Pain is aggravated when wearing enclosed shoe gear. Duration of condition: longstading. Prior attempts at treatment include: none.   Past Medical History:  Diagnosis Date   Acute cholecystitis    Allergic rhinitis    Anemia    Arthritis    Cancer ([place])    Right breast   Chest pain    Chronic renal insufficiency    followed by Dr Justin Mend stage 3   Clostridium difficile colitis    Constipation    Coronary atherosclerosis of native coronary vessel    Cough    Depression    situational - husband died 05-31-18   Diabetes mellitus    Dizziness    DM2 (diabetes mellitus, type 2) ([place])    Dyshidrosis    Family history of adverse reaction to anesthesia    daughter - nausea   GERD (gastroesophageal reflux disease)    Gout    HLD (hyperlipidemia)    HTN (hypertension)    Hypothyroidism    Neck mass    Neck pain    Osteoporosis    Personal history of radiation therapy 12/2018   Routine general medical examination at a health care facility    Secondary hyperparathyroidism (of renal origin)    Urinary incontinence    Urinary tract infection     Patient Active Problem List   Diagnosis Date Noted   Chronic diastolic heart failure ([place]) 01/30/2021   Other secondary pulmonary hypertension ([place]) 01/30/2021   Debility 03/27/2019   Morbid  obesity ([place]) 03/27/2019   Hyponatremia 03/18/2019   Acute renal failure superimposed on stage 3 chronic kidney disease ([place]) 03/18/2019   Cancer of overlapping sites of right female breast ([place]) 06/07/2018   Cancer of central portion of right female breast ([place]) 04/08/2018   Cough 10/11/2017   Abnormal endometrial ultrasound 08/03/2017   Chronic scapular pain 08/02/2017   Pelvic pain 07/27/2017   Headache 05/05/2017   Osteoarthritis 07/29/2016   UTI (urinary tract infection) 09/04/2015   Acute sinus infection 07/31/2015   Screening for breast cancer 01/31/2013   Diabetes mellitus with renal manifestation ([place]) 08/24/2012   Iron deficiency anemia, unspecified 01/27/2012   Screening examination for infectious disease 01/27/2012   Encounter for long-term (current) use of other medications 01/12/2011   OTITIS EXTERNA 03/04/2010   Shoulder pain, left 12/02/2009   Pancytopenia ([place]) 10/09/2009   CLOSTRIDIUM DIFFICILE COLITIS 08/23/2009   CAD (coronary artery disease) 08/23/2009   CKD (chronic kidney disease), stage III ([place]) 08/23/2009   Chest pain of uncertain etiology 22/97/9892   Acute cholecystitis 08/15/2009   Dyshidrosis 04/08/2009   DIZZINESS 07/03/2008   Secondary renal hyperparathyroidism ([place]) 06/15/2007   Hypothyroidism 04/20/2007   NECK PAIN 04/20/2007   NECK MASS 04/05/2007   GERD 03/09/2007   Dyslipidemia 09/20/2006   GOUT 09/20/2006   Essential hypertension 09/20/2006   ALLERGIC RHINITIS 09/20/2006  Osteoporosis 09/20/2006   URINARY INCONTINENCE 09/20/2006    Past Surgical History:  Procedure Laterality Date   bone density  08/21/05   BREAST BIOPSY Right 03/30/2018   x3   BREAST BIOPSY Right 04/25/2018   BREAST LUMPECTOMY Right 06/07/2018   BREAST LUMPECTOMY WITH RADIOACTIVE SEED AND AXILLARY LYMPH NODE DISSECTION Right 06/07/2018   Procedure: RIGHT BREAST LUMPECTOMY WITH BRACKETED RADIOACTIVE SEEDS AND RIGHT COMPLETE AXILLARY LYMPH NODE DISSECTION;  Surgeon:  Fanny Skates, MD;  Location: [facility];  Service: General;  Laterality: Right;   C-section (other)  [place]  02/01/06   L ankle surgery Left 1988   total left knee Left 2007    Current Outpatient Medications on File Prior to Visit  Medication Sig Dispense Refill   acetaminophen (TYLENOL) 325 MG tablet Take 1-2 tablets (325-650 mg total) by mouth every 4 (four) hours as needed for mild pain.     allopurinol (ZYLOPRIM) 100 MG tablet Take 100 mg by mouth daily.     aspirin EC 81 MG tablet Take 81 mg by mouth daily.     atorvastatin (LIPITOR) 40 MG tablet Take 1 tablet (40 mg total) by mouth daily. 90 tablet 1   cetirizine (ZYRTEC) 10 MG tablet Take 10 mg by mouth daily.      Ferrous Sulfate (IRON) 325 (65 Fe) MG TABS Take 1 tablet (325 mg total) by mouth daily. 30 tablet 0   furosemide (LASIX) 40 MG tablet Take 1 tablet (40 mg total) by mouth 2 (two) times daily. 180 tablet 2   glipiZIDE (GLUCOTROL) 5 MG tablet Take 5 mg by mouth daily before breakfast.     hydrALAZINE (APRESOLINE) 100 MG tablet Take 100 mg by mouth daily.     levothyroxine (SYNTHROID) 88 MCG tablet Take 88 mcg by mouth daily.     liothyronine (CYTOMEL) 5 MCG tablet Take 1 tablet (5 mcg total) by mouth daily. 30 tablet 0   metoprolol tartrate (LOPRESSOR) 25 MG tablet Take 0.5 tablets (12.5 mg total) by mouth 2 (two) times daily. 90 tablet 3   Multiple Vitamins-Minerals (MULTIVITAMIN WOMEN 50+ PO) Take 1 tablet by mouth daily.      nitroGLYCERIN (NITROSTAT) 0.4 MG SL tablet Place 1 tablet (0.4 mg total) under the tongue every 5 (five) minutes as needed for chest pain. 25 tablet 5   omeprazole (PRILOSEC) 20 MG capsule TAKE 1 CAPSULE(20 MG) BY MOUTH DAILY 90 capsule 0   Polyethyl Glycol-Propyl Glycol 0.4-0.3 % SOLN Place 1 drop into both eyes daily as needed (for dry eyes).     polyethylene glycol (MIRALAX / GLYCOLAX) 17 g packet Take 17 g by mouth as needed.     tamoxifen (NOLVADEX) 20 MG  tablet TAKE 1 TABLET(20 MG) BY MOUTH DAILY 90 tablet 3   No current facility-administered medications on file prior to visit.     Allergies  Allergen Reactions   Ace Inhibitors Cough   Azithromycin Other (See Comments)    Unknown   Pioglitazone Other (See Comments)    REACTION: edema   Sulfonamide Derivatives Rash   Tramadol Other (See Comments)    dizziness   Amlodipine Other (See Comments)    Unfavorable reaction/headache    Social History   Occupational History   Occupation: retired   Tobacco Use   Smoking status: Never   Smokeless tobacco: Never  Vaping Use   Vaping Use: Never used  Substance and Sexual Activity   Alcohol use: No  Drug use: No   Sexual activity: Not on file    Family History  Problem Relation Age of Onset   Coronary artery disease Father    Stroke Father    Diabetes Father    Diabetes Sister    Stroke Sister    Diabetes Brother    Cancer Brother        liver caner   Breast cancer Neg Hx     Immunization History  Administered Date(s) Administered   Hepatitis B 02/11/2012, 03/24/2012   Influenza Split 01/27/2012   Influenza Whole 02/01/2008, 04/08/2009, 01/13/2010   Influenza, High Dose Seasonal PF 01/15/2016, 01/13/2017   Influenza,inj,Quad PF,6+ Mos 01/13/2013   Influenza-Unspecified 02/18/2014   Pneumococcal Conjugate-13 03/07/2014   Pneumococcal Polysaccharide-23 04/21/1994, 04/24/2016   Td 04/21/1994   Tdap 05/05/2017   Zoster, Live 03/07/2014    Objective: There were no vitals filed for this visit.  Stacy Walls is a pleasant 87 y.o. female in NAD. AAO X 3.  Vascular Examination: Capillary refill time to digits immediate b/l. Palpable DP pulse(s) b/l lower extremities Palpable PT pulse(s) b/l lower extremities Pedal hair sparse. No pain with calf compression b/l. Lower extremity skin temperature gradient within normal limits. Nonpitting edema noted bilateral ankles.  Dermatological Examination: Pedal  integument with normal turgor, texture and tone BLE. No open wounds b/l LE. No interdigital macerations noted b/l LE. Toenails 1-5 b/l elongated, discolored, dystrophic, thickened, crumbly with subungual debris and tenderness to dorsal palpation. No hyperkeratotic nor porokeratotic lesions present on today's visit.  Musculoskeletal Examination: Normal muscle strength 5/5 to all lower extremity muscle groups bilaterally. Pes planus deformity noted b/l lower extremities.  Footwear Assessment: Does the patient wear appropriate shoes? Yes. Does the patient need inserts/orthotics? Yes.  Neurological Examination: Protective sensation intact 5/5 intact bilaterally with 10g monofilament b/l. Vibratory sensation intact b/l. Proprioception intact bilaterally. Deep tendon reflexes normal b/l.   Assessment: 1. Pain due to onychomycosis of toenails of both feet   2. Pes planus of both feet   3. Type 2 diabetes mellitus with stage 3b chronic kidney disease, without long-term current use of insulin ([place])   4. Encounter for diabetic foot exam ([place])     ADA Risk Categorization: Low Risk:  Patient has all of the following: Intact protective sensation No prior foot ulcer  No severe deformity Pedal pulses present  Plan: -Examined patient. -Diabetic foot examination performed today. -Discussed diabetic foot care principles. Literature dispensed on today. -Continue diabetic foot care principles: inspect feet daily, monitor glucose as recommended by PCP and/or Endocrinologist, and follow prescribed diet per PCP, Endocrinologist and/or dietician. -Patient to continue soft, supportive shoe gear daily. Start procedure for diabetic shoes. Patient qualifies based on diagnoses. -Toenails 1-5 b/l were debrided in length and girth with sterile nail nippers and dremel without iatrogenic bleeding.  -Patient/POA to call should there be question/concern in the interim.  Return in about 3 months (around  05/17/2021).  Marzetta Board, DPM

## 2021-02-27 ENCOUNTER — Inpatient Hospital Stay (HOSPITAL_BASED_OUTPATIENT_CLINIC_OR_DEPARTMENT_OTHER): Payer: Medicare HMO | Admitting: Hematology

## 2021-02-27 ENCOUNTER — Other Ambulatory Visit: Payer: Self-pay

## 2021-02-27 ENCOUNTER — Encounter: Payer: Self-pay | Admitting: Hematology

## 2021-02-27 ENCOUNTER — Inpatient Hospital Stay: Payer: Medicare HMO | Attending: Hematology

## 2021-02-27 VITALS — BP 153/47 | HR 57 | Temp 98.4°F | Resp 18 | Ht 62.0 in | Wt 217.3 lb

## 2021-02-27 DIAGNOSIS — C50111 Malignant neoplasm of central portion of right female breast: Secondary | ICD-10-CM | POA: Diagnosis present

## 2021-02-27 DIAGNOSIS — E1122 Type 2 diabetes mellitus with diabetic chronic kidney disease: Secondary | ICD-10-CM | POA: Insufficient documentation

## 2021-02-27 DIAGNOSIS — Z923 Personal history of irradiation: Secondary | ICD-10-CM | POA: Diagnosis not present

## 2021-02-27 DIAGNOSIS — D631 Anemia in chronic kidney disease: Secondary | ICD-10-CM | POA: Insufficient documentation

## 2021-02-27 DIAGNOSIS — K219 Gastro-esophageal reflux disease without esophagitis: Secondary | ICD-10-CM | POA: Insufficient documentation

## 2021-02-27 DIAGNOSIS — N189 Chronic kidney disease, unspecified: Secondary | ICD-10-CM | POA: Insufficient documentation

## 2021-02-27 DIAGNOSIS — I129 Hypertensive chronic kidney disease with stage 1 through stage 4 chronic kidney disease, or unspecified chronic kidney disease: Secondary | ICD-10-CM | POA: Insufficient documentation

## 2021-02-27 DIAGNOSIS — E785 Hyperlipidemia, unspecified: Secondary | ICD-10-CM | POA: Diagnosis not present

## 2021-02-27 DIAGNOSIS — Z7984 Long term (current) use of oral hypoglycemic drugs: Secondary | ICD-10-CM | POA: Diagnosis not present

## 2021-02-27 DIAGNOSIS — E039 Hypothyroidism, unspecified: Secondary | ICD-10-CM | POA: Insufficient documentation

## 2021-02-27 DIAGNOSIS — D696 Thrombocytopenia, unspecified: Secondary | ICD-10-CM | POA: Insufficient documentation

## 2021-02-27 DIAGNOSIS — Z170 Estrogen receptor positive status [ER+]: Secondary | ICD-10-CM | POA: Diagnosis not present

## 2021-02-27 DIAGNOSIS — Z79810 Long term (current) use of selective estrogen receptor modulators (SERMs): Secondary | ICD-10-CM | POA: Diagnosis not present

## 2021-02-27 DIAGNOSIS — M810 Age-related osteoporosis without current pathological fracture: Secondary | ICD-10-CM | POA: Diagnosis not present

## 2021-02-27 DIAGNOSIS — Z79899 Other long term (current) drug therapy: Secondary | ICD-10-CM | POA: Diagnosis not present

## 2021-02-27 DIAGNOSIS — C773 Secondary and unspecified malignant neoplasm of axilla and upper limb lymph nodes: Secondary | ICD-10-CM | POA: Diagnosis not present

## 2021-02-27 DIAGNOSIS — I2510 Atherosclerotic heart disease of native coronary artery without angina pectoris: Secondary | ICD-10-CM | POA: Insufficient documentation

## 2021-02-27 LAB — CBC WITH DIFFERENTIAL (CANCER CENTER ONLY)
Abs Immature Granulocytes: 0.01 10*3/uL (ref 0.00–0.07)
Basophils Absolute: 0 10*3/uL (ref 0.0–0.1)
Basophils Relative: 0 %
Eosinophils Absolute: 0.2 10*3/uL (ref 0.0–0.5)
Eosinophils Relative: 3 %
HCT: 33.7 % — ABNORMAL LOW (ref 36.0–46.0)
Hemoglobin: 10.7 g/dL — ABNORMAL LOW (ref 12.0–15.0)
Immature Granulocytes: 0 %
Lymphocytes Relative: 30 %
Lymphs Abs: 1.4 10*3/uL (ref 0.7–4.0)
MCH: 30.4 pg (ref 26.0–34.0)
MCHC: 31.8 g/dL (ref 30.0–36.0)
MCV: 95.7 fL (ref 80.0–100.0)
Monocytes Absolute: 0.4 10*3/uL (ref 0.1–1.0)
Monocytes Relative: 9 %
Neutro Abs: 2.7 10*3/uL (ref 1.7–7.7)
Neutrophils Relative %: 58 %
Platelet Count: 122 10*3/uL — ABNORMAL LOW (ref 150–400)
RBC: 3.52 MIL/uL — ABNORMAL LOW (ref 3.87–5.11)
RDW: 12.5 % (ref 11.5–15.5)
WBC Count: 4.6 10*3/uL (ref 4.0–10.5)
nRBC: 0 % (ref 0.0–0.2)

## 2021-02-27 LAB — CMP (CANCER CENTER ONLY)
ALT: 13 U/L (ref 0–44)
AST: 24 U/L (ref 15–41)
Albumin: 3.2 g/dL — ABNORMAL LOW (ref 3.5–5.0)
Alkaline Phosphatase: 61 U/L (ref 38–126)
Anion gap: 10 (ref 5–15)
BUN: 23 mg/dL (ref 8–23)
CO2: 25 mmol/L (ref 22–32)
Calcium: 8.9 mg/dL (ref 8.9–10.3)
Chloride: 107 mmol/L (ref 98–111)
Creatinine: 1.36 mg/dL — ABNORMAL HIGH (ref 0.44–1.00)
GFR, Estimated: 38 mL/min — ABNORMAL LOW (ref >60)
Glucose, Bld: 140 mg/dL — ABNORMAL HIGH (ref 70–99)
Potassium: 3.4 mmol/L — ABNORMAL LOW (ref 3.5–5.1)
Sodium: 142 mmol/L (ref 135–145)
Total Bilirubin: 0.5 mg/dL (ref 0.3–1.2)
Total Protein: 5.8 g/dL — ABNORMAL LOW (ref 6.5–8.1)

## 2021-02-27 NOTE — Progress Notes (Signed)
[place]   [phone] Fax:([phone]   Clinic Follow up Note   Patient Care Team: Sonia Side., FNP as PCP - General (Family Medicine) Jerline Pain, MD as PCP - Cardiology (Cardiology) Edrick Oh, MD (Nephrology) Maia Breslow, MD (Orthopedic Surgery) Newton Pigg, MD as Consulting Physician (Obstetrics and Gynecology) Truitt Merle, MD as Consulting Physician (Hematology) Fanny Skates, MD as Consulting Physician (General Surgery) Alla Feeling, NP as Nurse Practitioner (Nurse Practitioner) Eppie Gibson, MD as Attending Physician (Radiation Oncology)  Date of Service:  02/27/2021  CHIEF COMPLAINT: f/u of right breast cancer  CURRENT THERAPY:  Tamoxifen 11m daily starting in March or April 2020.   ASSESSMENT & PLAN:  Stacy Walls a 87y.o. female with   1. Cancer of central right breast, invasive carcinoma, stage IB, pT2c(m)N2aM0, ER+/PR+, HER2-, Grade II -She was diagnosed in 03/2018. She underwent right breast lumpectomy and axillary lymph node dissection on 06/07/18. Her tumor was completely resected, negative margins, but she had 7/14 positive lymph nodes.  -Given her multiple positive LN she has high risk of recurrence. Standard treatment is adjuvant chemotherapy, but given her age, medical comorbidities, and lobular histology which is less sensitive to chemotherapy, I do not recommend chemotherapy. Patient agrees with the plan.  -She underwent breast radiation in 11/2018.  -She started Tamoxifen in March or April of 2020. She is tolerating well.  -most recent MM from 05/24/20 was benign. -From a breast cancer standpoint, she is clinically doing well. Her physical exam was unremarkable. There is no clinical concern for recurrence. -Continue surveillance. Next mammogram in 05/2021 -Continue Tamoxifen  -F/u in 6 months.    2. Iron deficient anemia and anemia of chronic disease secondary to CKD  -She was previously  being treated with monthly EPO injections by Dr. WJustin Mendwhich was stopped due to possible tumor growth.  -She has not had IV iron before. On oral iron pill daily, continue -Anemia remains mild.    3. CKD, stage IV, HTN, DM -On Lasix, Hyzaar, Omeprazole, Januvia -Managed by PCP, nephrologist and cardiologist.  -Stable with mild improvement.    4. Osteoporosis, OA (mainly in knees) -Seen on DEXA from 03/2014 with lowest T-score of -2.6 in femoral neck. 12/08/18 DEXA shows T-score -3.1 at right femur neck.  -She is on Tamoxifen which can help her bone density. She is on Multivitamin. She can ask Dr WJustin Mendabout restarting Calcium and Vit D.  -She ambulates with cane. She has had no recent fall or fracture.  -repeat DEXA scheduled for 04/02/21.   5. Thrombocytopenia  -Plt has been mildly low since early 2020 and been fluctuating since.  -I discussed this could be from her comorbidities or Tamoxifen. I encouraged her to watch for bleeding and bruising and avoid falls and injury.     PLAN:  -Continue Tamoxifen  -DEXA scheduled 04/02/21 -mammogram 05/2021 -Lab and f/u with NP Lacie in 6 months   No problem-specific Assessment & Plan notes found for this encounter.   SUMMARY OF ONCOLOGIC HISTORY: Oncology History Overview Note  Cancer Staging Cancer of central portion of right female breast ([facility] Staging form: Breast, AJCC 8th Edition - Clinical stage from 03/30/2018: Stage IB (cT1c(m), cN1, cM0, G2, ER+, PR+, HER2-) - Signed by FTruitt Merle MD on 04/08/2018 - Pathologic stage from 06/07/2018: Stage IB (pT2(m), pN2a, cM0, G2, ER+, PR+, HER2-) - Signed by CGardenia Phlegm NP on 06/22/2018     Cancer of central portion  of right female breast ([place])  01/13/2018 Mammogram   Diagnostic Mammogram 01/13/18  RECOMMENDATION: 1. Ultrasound-guided biopsies of both masses in the RIGHT breast identified by ultrasound at the 12 o'clock axis (7 cm from the nipple measuring 1.1 x 0.8 x 0.7 cm and 5  cm from the nipple measuring 1 x 0.8 x 0.9 cm respectively). 2. Ultrasound-guided biopsy of the most prominent lymph node in the RIGHT axilla. 3. Postprocedure mammogram to ensure that the masses correspond to the extent of the asymmetry/distortion seen on mammogram. Additional stereotactic biopsy may be needed if the clips do not approximate the anterior and posterior extents. 4. Depending on the location of the post biopsy clips, and if breast conservation surgery is considered, stereotactic biopsy may be also needed for the coarse heterogeneous calcifications within the slightly outer RIGHT breast, measuring 8 mm extent, located 1.2 cm from the asymmetry/distortion.   03/30/2018 Cancer Staging   Staging form: Breast, AJCC 8th Edition - Clinical stage from 03/30/2018: Stage IB (cT1c(m), cN1, cM0, G2, ER+, PR+, HER2-) - Signed by Truitt Merle, MD on 04/08/2018    03/30/2018 Initial Biopsy   Diagnosis 03/30/18 1. Breast, right, needle core biopsy, 12 o'clock position, 5cm from nipple - INVASIVE LOBULAR CARCINOMA, GRADE 2. SEE NOTE. - LOBULAR CARCINOMA IN SITU, INTERMEDIATE NUCLEAR GRADE. 2. Breast, right, needle core biopsy, 12 o'clock position 7cfn - INVASIVE LOBULAR CARCINOMA, GRADE 2. SEE NOTE. 1 of 3 FINAL for Brimage, Maylani R ([id number]) Diagnosis(continued) - LOBULAR CARCINOMA IN SITU, INTERMEDIATE NUCLEAR GRADE. 3. Lymph node, needle/core biopsy, right axilla - METASTATIC LOBULAR CARCINOMA TO LYMPH NODE.   03/30/2018 Receptors her2   Results: IMMUNOHISTOCHEMICAL AND MORPHOMETRIC ANALYSIS PERFORMED MANUALLY The tumor cells are NEGATIVE for Her2 (1+). Estrogen Receptor: 100%, POSITIVE, STRONG STAINING INTENSITY Progesterone Receptor: 70%, POSITIVE, STRONG STAINING INTENSITY Proliferation Marker Ki67: 10%   04/08/2018 Initial Diagnosis   Cancer of central portion of right female breast ([place])   04/27/2018 Imaging   CT CAP W Contrast 04/27/18  IMPRESSION: 1. Right  axillary and right subpectoral lymphadenopathy, suspicious for metastatic disease. PET-CT may prove helpful to further evaluate. 2. Cholelithiasis. 3. Colonic diverticulosis without diverticulitis. 4.  Aortic Atherosclerois (ICD10-170.0)   04/28/2018 Imaging   Bone Scan 04/28/18  IMPRESSION: No scintigraphic evidence skeletal metastasis. Severe degenerative uptake in the medial compartment RIGHT knee.   06/07/2018 Cancer Staging   Staging form: Breast, AJCC 8th Edition - Pathologic stage from 06/07/2018: Stage IB (pT2(m), pN2a, cM0, G2, ER+, PR+, HER2-) - Signed by Gardenia Phlegm, NP on 06/22/2018    06/07/2018 Surgery   RIGHT BREAST LUMPECTOMY WITH BRACKETED RADIOACTIVE SEEDS AND RIGHT COMPLETE AXILLARY LYMPH NODE DISSECTION by Dr. Dalbert Batman 06/07/18    06/07/2018 Pathology Results   Diagnosis 06/07/18 1. Breast, lumpectomy, right with radioactive seeds - INVASIVE LOBULAR CARCINOMA, MULTIFOCAL, LARGER FOCUS IS 3 CM, NOTTINGHAM GRADE 2 OF 3. - MARGINS OF RESECTION ARE NOT INVOLVED (CLOSEST MARGIN: LESS THAN 1 MM, ANTERIOR). - BIOPSY SITE CHANGES. - SEE ONCOLOGY TABLE. 2. Lymph nodes, regional resection, right axillary contents - METASTATIC CARCINOMA PRESENT IN SEVEN OF FOURTEEN LYMPH NODES (7/14). 3. Breast, excision, inferior margin - BREAST PARENCHYMA, NEGATIVE FOR CARCINOMA.   06/2018 -  Anti-estrogen oral therapy   Tamoxifen 3m daily starting in March or April 2020.    10/13/2018 - 11/25/2018 Radiation Therapy   Adjuvant radiation to right breat per Dr. SIsidore Moos  05/31/2019 Survivorship   SCP delivered by LCira Rue NP  INTERVAL HISTORY:  Asusena Evadna Donaghy is here for a follow up of breast cancer. She was last seen by me on 09/02/20. She presents to the clinic alone. She reports she is doing well overall. She notes she is in a wheelchair because of her arthritis. She denies any issues from tamoxifen.   All other systems were reviewed with the patient and are  negative.  MEDICAL HISTORY:  Past Medical History:  Diagnosis Date   Acute cholecystitis    Allergic rhinitis    Anemia    Arthritis    Cancer ([place])    Right breast   Chest pain    Chronic renal insufficiency    followed by Dr Justin Mend stage 3   Clostridium difficile colitis    Constipation    Coronary atherosclerosis of native coronary vessel    Cough    Depression    situational - husband died May 18, 2018   Diabetes mellitus    Dizziness    DM2 (diabetes mellitus, type 2) ([place])    Dyshidrosis    Family history of adverse reaction to anesthesia    daughter - nausea   GERD (gastroesophageal reflux disease)    Gout    HLD (hyperlipidemia)    HTN (hypertension)    Hypothyroidism    Neck mass    Neck pain    Osteoporosis    Personal history of radiation therapy 12/2018   Routine general medical examination at a health care facility    Secondary hyperparathyroidism (of renal origin)    Urinary incontinence    Urinary tract infection     SURGICAL HISTORY: Past Surgical History:  Procedure Laterality Date   bone density  08/21/05   BREAST BIOPSY Right 03/30/2018   x3   BREAST BIOPSY Right 04/25/2018   BREAST LUMPECTOMY Right 06/07/2018   BREAST LUMPECTOMY WITH RADIOACTIVE SEED AND AXILLARY LYMPH NODE DISSECTION Right 06/07/2018   Procedure: RIGHT BREAST LUMPECTOMY WITH BRACKETED RADIOACTIVE SEEDS AND RIGHT COMPLETE AXILLARY LYMPH NODE DISSECTION;  Surgeon: Fanny Skates, MD;  Location: [place];  Service: General;  Laterality: Right;   C-section (other)  1977   CHOLECYSTECTOMY     ELECTROCARDIOGRAM  02/01/06   L ankle surgery Left 1988   total left knee Left 2007    I have reviewed the social history and family history with the patient and they are unchanged from previous note.  ALLERGIES:  is allergic to ace inhibitors, azithromycin, pioglitazone, sulfonamide derivatives, tramadol, and amlodipine.  MEDICATIONS:  Current Outpatient Medications  Medication Sig Dispense  Refill   acetaminophen (TYLENOL) 325 MG tablet Take 1-2 tablets (325-650 mg total) by mouth every 4 (four) hours as needed for mild pain. (Patient taking differently: Take 500 mg by mouth every 4 (four) hours as needed for mild pain.)     allopurinol (ZYLOPRIM) 100 MG tablet Take 100 mg by mouth daily.     aspirin EC 81 MG tablet Take 81 mg by mouth daily.     atorvastatin (LIPITOR) 40 MG tablet Take 1 tablet (40 mg total) by mouth daily. 90 tablet 1   cetirizine (ZYRTEC) 10 MG tablet Take 10 mg by mouth daily.      Ferrous Sulfate (IRON) 325 (65 Fe) MG TABS Take 1 tablet (325 mg total) by mouth daily. 30 tablet 0   furosemide (LASIX) 40 MG tablet Take 1 tablet (40 mg total) by mouth 2 (two) times daily. 180 tablet 2   glipiZIDE (GLUCOTROL) 5 MG tablet Take 5 mg by mouth  daily before breakfast.     hydrALAZINE (APRESOLINE) 100 MG tablet Take 100 mg by mouth daily.     levothyroxine (SYNTHROID) 88 MCG tablet Take 88 mcg by mouth daily.     liothyronine (CYTOMEL) 5 MCG tablet Take 1 tablet (5 mcg total) by mouth daily. 30 tablet 0   metoprolol tartrate (LOPRESSOR) 25 MG tablet Take 0.5 tablets (12.5 mg total) by mouth 2 (two) times daily. (Patient taking differently: Take 25 mg by mouth 2 (two) times daily.) 90 tablet 3   Multiple Vitamins-Minerals (MULTIVITAMIN WOMEN 50+ PO) Take 1 tablet by mouth daily.      omeprazole (PRILOSEC) 20 MG capsule TAKE 1 CAPSULE(20 MG) BY MOUTH DAILY 90 capsule 0   Polyethyl Glycol-Propyl Glycol 0.4-0.3 % SOLN Place 1 drop into both eyes daily as needed (for dry eyes).     tamoxifen (NOLVADEX) 20 MG tablet TAKE 1 TABLET(20 MG) BY MOUTH DAILY 90 tablet 3   nitroGLYCERIN (NITROSTAT) 0.4 MG SL tablet Place 1 tablet (0.4 mg total) under the tongue every 5 (five) minutes as needed for chest pain. 25 tablet 5   No current facility-administered medications for this visit.    PHYSICAL EXAMINATION: ECOG PERFORMANCE STATUS: 2 - Symptomatic, <50% confined to bed  Vitals:    02/27/21 1222  BP: (!) 153/47  Pulse: (!) 57  Resp: 18  Temp: 98.4 F (36.9 C)  SpO2: 99%   Wt Readings from Last 3 Encounters:  02/27/21 217 lb 4.8 oz (98.6 kg)  01/30/21 213 lb (96.6 kg)  09/02/20 218 lb 4.8 oz (99 kg)     GENERAL:alert, no distress and comfortable SKIN: skin color, texture, turgor are normal, no rashes or significant lesions EYES: normal, Conjunctiva are pink and non-injected, sclera clear  NECK: supple, thyroid normal size, non-tender, without nodularity LYMPH:  no palpable lymphadenopathy in the cervical, axillary  LUNGS: clear to auscultation and percussion with normal breathing effort HEART: regular rate & rhythm and no murmurs, (+) lower extremity edema Musculoskeletal:no cyanosis of digits and no clubbing  NEURO: alert & oriented x 3 with fluent speech, no focal motor/sensory deficits BREAST: No palpable mass, nodules or adenopathy bilaterally. Breast exam benign.   LABORATORY DATA:  I have reviewed the data as listed CBC Latest Ref Rng & Units 02/27/2021 09/02/2020 03/06/2020  WBC 4.0 - 10.5 K/uL 4.6 5.8 5.1  Hemoglobin 12.0 - 15.0 g/dL 10.7(L) 10.6(L) 11.1(L)  Hematocrit 36.0 - 46.0 % 33.7(L) 33.2(L) 34.7(L)  Platelets 150 - 400 K/uL 122(L) 105(L) 89(L)     CMP Latest Ref Rng & Units 02/27/2021 09/02/2020 03/06/2020  Glucose 70 - 99 mg/dL 140(H) 89 175(H)  BUN 8 - 23 mg/dL 23 37(H) 43(H)  Creatinine 0.44 - 1.00 mg/dL 1.36(H) 1.75(H) 1.83(H)  Sodium 135 - 145 mmol/L 142 141 141  Potassium 3.5 - 5.1 mmol/L 3.4(L) 3.5 4.0  Chloride 98 - 111 mmol/L 107 107 105  CO2 22 - 32 mmol/L 25 27 27   Calcium 8.9 - 10.3 mg/dL 8.9 9.7 9.7  Total Protein 6.5 - 8.1 g/dL 5.8(L) 6.1(L) 6.5  Total Bilirubin 0.3 - 1.2 mg/dL 0.5 0.5 0.7  Alkaline Phos 38 - 126 U/L 61 57 65  AST 15 - 41 U/L 24 23 20   ALT 0 - 44 U/L 13 13 11       RADIOGRAPHIC STUDIES: I have personally reviewed the radiological images as listed and agreed with the findings in the report. No  results found.    Orders Placed This Encounter  Procedures   MM Digital Screening    Standing Status:   Future    Standing Expiration Date:   02/27/2022    Order Specific Question:   Reason for Exam (SYMPTOM  OR DIAGNOSIS REQUIRED)    Answer:   screening    Order Specific Question:   Preferred imaging location?    Answer:   [facility]   All questions were answered. The patient knows to call the clinic with any problems, questions or concerns. No barriers to learning was detected. The total time spent in the appointment was 30 minutes.     Truitt Merle, MD 02/27/2021   I, Wilburn Mylar, am acting as scribe for Truitt Merle, MD.   I have reviewed the above documentation for accuracy and completeness, and I agree with the above.

## 2021-02-28 LAB — CANCER ANTIGEN 27.29: CA 27.29: 25 U/mL (ref 0.0–38.6)

## 2021-03-05 ENCOUNTER — Other Ambulatory Visit: Payer: Medicare HMO

## 2021-03-05 ENCOUNTER — Ambulatory Visit: Payer: Medicare HMO | Admitting: Hematology

## 2021-04-02 ENCOUNTER — Ambulatory Visit
Admission: RE | Admit: 2021-04-02 | Discharge: 2021-04-02 | Disposition: A | Discharge status: Home / Self Care | Payer: Medicare HMO | Source: Ambulatory Visit | Attending: Hematology | Admitting: Hematology

## 2021-04-02 DIAGNOSIS — E2839 Other primary ovarian failure: Secondary | ICD-10-CM

## 2021-04-16 ENCOUNTER — Other Ambulatory Visit: Payer: Self-pay | Admitting: Nurse Practitioner

## 2021-04-16 DIAGNOSIS — Z170 Estrogen receptor positive status [ER+]: Secondary | ICD-10-CM

## 2021-05-26 ENCOUNTER — Other Ambulatory Visit: Payer: Self-pay

## 2021-05-26 ENCOUNTER — Ambulatory Visit: Payer: Medicare HMO | Admitting: Podiatry

## 2021-05-26 ENCOUNTER — Ambulatory Visit
Admission: RE | Admit: 2021-05-26 | Discharge: 2021-05-26 | Disposition: A | Discharge status: Home / Self Care | Payer: Medicare HMO | Source: Ambulatory Visit | Attending: Hematology | Admitting: Hematology

## 2021-05-26 DIAGNOSIS — C50111 Malignant neoplasm of central portion of right female breast: Secondary | ICD-10-CM

## 2021-06-14 ENCOUNTER — Other Ambulatory Visit: Payer: Self-pay | Admitting: Cardiology

## 2021-07-11 ENCOUNTER — Ambulatory Visit (INDEPENDENT_AMBULATORY_CARE_PROVIDER_SITE_OTHER): Payer: Medicare HMO | Admitting: Podiatry

## 2021-07-11 ENCOUNTER — Ambulatory Visit: Payer: Medicare HMO | Admitting: Podiatry

## 2021-07-11 ENCOUNTER — Other Ambulatory Visit: Payer: Self-pay

## 2021-07-11 DIAGNOSIS — M79675 Pain in left toe(s): Secondary | ICD-10-CM

## 2021-07-11 DIAGNOSIS — M79674 Pain in right toe(s): Secondary | ICD-10-CM

## 2021-07-11 DIAGNOSIS — B351 Tinea unguium: Secondary | ICD-10-CM

## 2021-07-17 NOTE — Progress Notes (Signed)
?  Subjective:  ?Patient ID: Stacy Walls, female    DOB: 12/05/1932,  MRN: [id number] ? ?Stacy Walls presents to clinic today for at risk foot care. Pt has h/o NIDDM with chronic kidney disease and painful elongated mycotic toenails 1-5 bilaterally which are tender when wearing enclosed shoe gear. Pain is relieved with periodic professional debridement. ? ?Patient states blood glucose was 123 mg/dl today. ? ?New problem(s): None. ? ?PCP is Sonia Side., FNP , and last visit was June 09, 2021. ? ?Allergies  ?Allergen Reactions  ? Ace Inhibitors Cough  ? Azithromycin Other (See Comments)  ?  Unknown  ? Pioglitazone Other (See Comments)  ?  REACTION: edema  ? Sulfonamide Derivatives Rash  ? Tramadol Other (See Comments)  ?  dizziness  ? Amlodipine Other (See Comments)  ?  Unfavorable reaction/headache  ? ? ?Review of Systems: Negative except as noted in the HPI. ? ?Objective: No changes noted in today's physical examination. ?Stacy Walls is a pleasant 88 y.o. female in NAD. AAO X 3. ? ?Vascular Examination: ?Capillary refill time to digits immediate b/l. Palpable DP pulse(s) b/l lower extremities Palpable PT pulse(s) b/l lower extremities Pedal hair sparse. No pain with calf compression b/l. Lower extremity skin temperature gradient within normal limits. Nonpitting edema noted bilateral ankles. ? ?Dermatological Examination: ?Pedal integument with normal turgor, texture and tone BLE. No open wounds b/l LE. No interdigital macerations noted b/l LE. Toenails 1-5 b/l elongated, discolored, dystrophic, thickened, crumbly with subungual debris and tenderness to dorsal palpation. No hyperkeratotic nor porokeratotic lesions present on today's visit. ? ?Musculoskeletal Examination: ?Normal muscle strength 5/5 to all lower extremity muscle groups bilaterally. Pes planus deformity noted b/l lower extremities. ? ?Neurological Examination: ?Protective sensation intact 5/5 intact  bilaterally with 10g monofilament b/l. Vibratory sensation intact b/l. Proprioception intact bilaterally. Deep tendon reflexes normal b/l. Patient ambulates with cane assistance. ? ?Assessment/Plan: ?1. Pain due to onychomycosis of toenails of both feet   ? ?-Patient was evaluated and treated. All patient's and/or POA's questions/concerns answered on today's visit. ?-No new findings. No new orders. ?-Mycotic toenails 1-5 bilaterally were debrided in length and girth with sterile nail nippers and dremel without incident. ?-Patient/POA to call should there be question/concern in the interim.  ? ?Return in about 3 months (around 10/11/2021). ? ?Marzetta Board, DPM  ?

## 2021-08-20 ENCOUNTER — Telehealth: Payer: Self-pay | Admitting: Nurse Practitioner

## 2021-08-20 NOTE — Telephone Encounter (Signed)
Rescheduled upcoming appointment due to provider's PAL. Patient is aware of changes. ?

## 2021-08-28 ENCOUNTER — Inpatient Hospital Stay: Payer: Medicare HMO | Admitting: Nurse Practitioner

## 2021-08-28 ENCOUNTER — Inpatient Hospital Stay: Payer: Medicare HMO

## 2021-09-03 ENCOUNTER — Other Ambulatory Visit: Payer: Self-pay | Admitting: Cardiology

## 2021-09-07 NOTE — Progress Notes (Unsigned)
[facility]   [phone] [phone]   Clinic Follow up Note   Patient Care Team: Stacy Side., FNP as PCP - General (Family Medicine) Stacy Pain, MD as PCP - Cardiology (Cardiology) Stacy Oh, MD (Nephrology) Stacy Breslow, MD (Orthopedic Surgery) Stacy Pigg, MD as Consulting Physician (Obstetrics and Gynecology) Stacy Merle, MD as Consulting Physician (Hematology) Stacy Skates, MD as Consulting Physician (General Surgery) Stacy Feeling, NP as Nurse Practitioner (Nurse Practitioner) Stacy Gibson, MD as Attending Physician (Radiation Oncology) 09/09/2021  CHIEF COMPLAINT: Follow up right breast cancer   SUMMARY OF ONCOLOGIC HISTORY: Oncology History Overview Note  Cancer Staging Cancer of central portion of right female breast [facility]) Staging form: Breast, AJCC 8th Edition - Clinical stage from 03/30/2018: Stage IB (cT1c(m), cN1, cM0, G2, ER+, PR+, HER2-) - Signed by Stacy Merle, MD on 04/08/2018 - Pathologic stage from 06/07/2018: Stage IB (pT2(m), pN2a, cM0, G2, ER+, PR+, HER2-) - Signed by Stacy Phlegm, NP on 06/22/2018     Cancer of central portion of right female breast ([place])  01/13/2018 Mammogram   Diagnostic Mammogram 01/13/18  RECOMMENDATION: 1. Ultrasound-guided biopsies of both masses in the RIGHT breast identified by ultrasound at the 12 o'clock axis (7 cm from the nipple measuring 1.1 x 0.8 x 0.7 cm and 5 cm from the nipple measuring 1 x 0.8 x 0.9 cm respectively). 2. Ultrasound-guided biopsy of the most prominent lymph node in the RIGHT axilla. 3. Postprocedure mammogram to ensure that the masses correspond to the extent of the asymmetry/distortion seen on mammogram. Additional stereotactic biopsy may be needed if the clips do not approximate the anterior and posterior extents. 4. Depending on the location of the post biopsy clips, and if breast conservation surgery is considered, stereotactic biopsy may  be also needed for the coarse heterogeneous calcifications within the slightly outer RIGHT breast, measuring 8 mm extent, located 1.2 cm from the asymmetry/distortion.    03/30/2018 Cancer Staging   Staging form: Breast, AJCC 8th Edition - Clinical stage from 03/30/2018: Stage IB (cT1c(m), cN1, cM0, G2, ER+, PR+, HER2-) - Signed by Stacy Merle, MD on 04/08/2018    03/30/2018 Initial Biopsy   Diagnosis 03/30/18 1. Breast, right, needle core biopsy, 12 o'clock position, 5cm from nipple - INVASIVE LOBULAR CARCINOMA, GRADE 2. SEE NOTE. - LOBULAR CARCINOMA IN SITU, INTERMEDIATE NUCLEAR GRADE. 2. Breast, right, needle core biopsy, 12 o'clock position 7cfn - INVASIVE LOBULAR CARCINOMA, GRADE 2. SEE NOTE. 1 of 3 FINAL for Stacy Walls ([id number]) Diagnosis(continued) - LOBULAR CARCINOMA IN SITU, INTERMEDIATE NUCLEAR GRADE. 3. Lymph node, needle/core biopsy, right axilla - METASTATIC LOBULAR CARCINOMA TO LYMPH NODE.    03/30/2018 Receptors her2   Results: IMMUNOHISTOCHEMICAL AND MORPHOMETRIC ANALYSIS PERFORMED MANUALLY The tumor cells are NEGATIVE for Her2 (1+). Estrogen Receptor: 100%, POSITIVE, STRONG STAINING INTENSITY Progesterone Receptor: 70%, POSITIVE, STRONG STAINING INTENSITY Proliferation Marker Ki67: 10%    04/08/2018 Initial Diagnosis   Cancer of central portion of right female breast ([place])    04/27/2018 Imaging   CT CAP W Contrast 04/27/18  IMPRESSION: 1. Right axillary and right subpectoral lymphadenopathy, suspicious for metastatic disease. PET-CT may prove helpful to further evaluate. 2. Cholelithiasis. 3. Colonic diverticulosis without diverticulitis. 4.  Aortic Atherosclerois (ICD10-170.0)    04/28/2018 Imaging   Bone Scan 04/28/18  IMPRESSION: No scintigraphic evidence skeletal metastasis. Severe degenerative uptake in the medial compartment RIGHT knee.    06/07/2018 Cancer Staging   Staging form: Breast, AJCC 8th Edition -  Pathologic stage from  06/07/2018: Stage IB (pT2(m), pN2a, cM0, G2, ER+, PR+, HER2-) - Signed by Stacy Phlegm, NP on 06/22/2018    06/07/2018 Surgery   RIGHT BREAST LUMPECTOMY WITH BRACKETED RADIOACTIVE SEEDS AND RIGHT COMPLETE AXILLARY LYMPH NODE DISSECTION by Stacy Walls 06/07/18     06/07/2018 Pathology Results   Diagnosis 06/07/18 1. Breast, lumpectomy, right with radioactive seeds - INVASIVE LOBULAR CARCINOMA, MULTIFOCAL, LARGER FOCUS IS 3 CM, NOTTINGHAM GRADE 2 OF 3. - MARGINS OF RESECTION ARE NOT INVOLVED (CLOSEST MARGIN: LESS THAN 1 MM, ANTERIOR). - BIOPSY SITE CHANGES. - SEE ONCOLOGY TABLE. 2. Lymph nodes, regional resection, right axillary contents - METASTATIC CARCINOMA PRESENT IN SEVEN OF FOURTEEN LYMPH NODES (7/14). 3. Breast, excision, inferior margin - BREAST PARENCHYMA, NEGATIVE FOR CARCINOMA.    06/2018 -  Anti-estrogen oral therapy   Tamoxifen 38m daily starting in March or April 2020.    10/13/2018 - 11/25/2018 Radiation Therapy   Adjuvant radiation to right breat per Stacy Walls  05/31/2019 Survivorship   SCP delivered by LCira Rue NP      CURRENT THERAPY: Tamoxifen 20 mg daily starting in March/April 2020  INTERVAL HISTORY: [name] for follow up as scheduled. Last seen by Dr. FBurr Medico11/10/22. Mammogram 05/26/21 was negative. She continues tamoxifen, tolerating well without noticeable side effects except very mild hot flashes.  She feels tired at times, recently started [name] her kidney doctor and seems to be Walls better.  Denies concerns in her breast such as new lump/mass, nipple discharge or inversion, or skin change.  Denies bone or joint Walls, vaginal bleeding, change in bowel habits, abdominal Walls/bloating, new cough, dyspnea, leg edema, or any other new specific complaints.  All other systems were reviewed with the patient and are negative.  MEDICAL HISTORY:  Past Medical History:  Diagnosis Date   Acute cholecystitis    Allergic rhinitis    Anemia     Arthritis    [place]    Right breast   Chest Walls    Chronic renal insufficiency    followed by Dr WJustin Mendstage 3   Clostridium difficile colitis    Constipation    Coronary atherosclerosis of native coronary vessel    Cough    Depression    situational - husband died 102-17-2020  Diabetes mellitus    Dizziness    DM2 (diabetes mellitus, type 2) ([place]    Dyshidrosis    Family history of adverse reaction to anesthesia    daughter - nausea   GERD (gastroesophageal reflux disease)    Gout    HLD (hyperlipidemia)    HTN (hypertension)    Hypothyroidism    Neck mass    Neck Walls    Osteoporosis    Personal history of radiation therapy 12/2018   Routine general medical examination at a health care facility    Secondary hyperparathyroidism (of renal origin)    Urinary incontinence    Urinary tract infection     SURGICAL HISTORY: Past Surgical History:  Procedure Laterality Date   bone density  08/21/05   BREAST BIOPSY Right 03/30/2018   x3   BREAST BIOPSY Right 04/25/2018   BREAST LUMPECTOMY Right 06/07/2018   BREAST LUMPECTOMY WITH RADIOACTIVE SEED AND AXILLARY LYMPH NODE DISSECTION Right 06/07/2018   Procedure: RIGHT BREAST LUMPECTOMY WITH BRACKETED RADIOACTIVE SEEDS AND RIGHT COMPLETE AXILLARY LYMPH NODE DISSECTION;  Surgeon: IFanny Skates MD;  [facility]  Service: General;  Laterality: Right;   C-section (  other)  1977   CHOLECYSTECTOMY     ELECTROCARDIOGRAM  02/01/06   L ankle surgery Left 1988   total left knee Left 2007    I have reviewed the social history and family history with the patient and they are unchanged from previous note.  ALLERGIES:  is allergic to ace inhibitors, azithromycin, pioglitazone, sulfonamide derivatives, tramadol, and amlodipine.  MEDICATIONS:  Current Outpatient Medications  Medication Sig Dispense Refill   acetaminophen (TYLENOL) 325 MG tablet Take 1-2 tablets (325-650 mg total) by mouth every 4 (four) hours as needed for  mild Walls. (Patient taking differently: Take 500 mg by mouth every 4 (four) hours as needed for mild Walls.)     allopurinol (ZYLOPRIM) 100 MG tablet Take 100 mg by mouth daily.     aspirin EC 81 MG tablet Take 81 mg by mouth daily.     atorvastatin (LIPITOR) 40 MG tablet Take 1 tablet (40 mg total) by mouth daily. 90 tablet 1   cetirizine (ZYRTEC) 10 MG tablet Take 10 mg by mouth daily.      Ferrous Sulfate (IRON) 325 (65 Fe) MG TABS Take 1 tablet (325 mg total) by mouth daily. 30 tablet 0   fluticasone (FLONASE) 50 MCG/ACT nasal spray Place into both nostrils.     furosemide (LASIX) 40 MG tablet TAKE 1 TABLET(40 MG) BY MOUTH TWICE DAILY 180 tablet 0   glipiZIDE (GLUCOTROL) 5 MG tablet Take 5 mg by mouth daily before breakfast.     hydrALAZINE (APRESOLINE) 100 MG tablet Take 100 mg by mouth daily.     hydrALAZINE (APRESOLINE) 50 MG tablet Take 50 mg by mouth 2 (two) times daily.     levocetirizine (XYZAL) 5 MG tablet Take 5 mg by mouth daily.     levothyroxine (SYNTHROID) 88 MCG tablet Take 88 mcg by mouth daily.     liothyronine (CYTOMEL) 5 MCG tablet Take 1 tablet (5 mcg total) by mouth daily. 30 tablet 0   metoprolol tartrate (LOPRESSOR) 25 MG tablet Take 0.5 tablets (12.5 mg total) by mouth 2 (two) times daily. (Patient taking differently: Take 25 mg by mouth 2 (two) times daily.) 90 tablet 3   Multiple Vitamins-Minerals (MULTIVITAMIN WOMEN 50+ PO) Take 1 tablet by mouth daily.      nitroGLYCERIN (NITROSTAT) 0.4 MG SL tablet PLACE 1 TABLET(0.4 MG TOTAL) UNDER THE TONGUE EVERY 5(FIVE) MINUTES AS NEEDED FOR CHEST Walls. 25 tablet 8   omeprazole (PRILOSEC) 20 MG capsule TAKE 1 CAPSULE(20 MG) BY MOUTH DAILY 90 capsule 0   Polyethyl Glycol-Propyl Glycol 0.4-0.3 % SOLN Place 1 drop into both eyes daily as needed (for dry eyes).     tamoxifen (NOLVADEX) 20 MG tablet Take 1 tablet (20 mg total) by mouth daily. 90 tablet 3   No current facility-administered medications for this visit.    PHYSICAL  EXAMINATION: ECOG PERFORMANCE STATUS: 0 - Asymptomatic  Vitals:   09/09/21 1020  BP: (!) 171/69  Pulse: 69  Resp: 17  Temp: 97.6 F (36.4 C)  SpO2: 95%   Filed Weights   09/09/21 1020  Weight: 218 lb 1.6 oz (98.9 kg)    GENERAL:alert, no distress and comfortable SKIN: skin color, texture, turgor are normal, no rashes or significant lesions EYES: sclera clear NECK: Without mass LYMPH:  no palpable cervical or supraclavicular lymphadenopathy LUNGS:  normal breathing effort HEART:  no lower extremity edema NEURO: alert & oriented x 3 with fluent speech Breast exam: Pendulous breast without nipple discharge or inversion.  S/p right lumpectomy and radiation, incisions completely healed with minimal scar tissue.  Mild hyperpigmentation throughout the right breast.  No palpable mass or nodularity in either breast or axilla that I could appreciate. Exam performed in wheelchair  LABORATORY DATA:  I have reviewed the data as listed    Latest Ref Rng & Units 09/09/2021   10:05 AM 02/27/2021   11:36 AM 09/02/2020   10:00 AM  CBC  WBC 4.0 - 10.5 K/uL 4.9   4.6   5.8    Hemoglobin 12.0 - 15.0 g/dL 12.0   10.7   10.6    Hematocrit 36.0 - 46.0 % 37.2   33.7   33.2    Platelets 150 - 400 K/uL 100   122   105          Latest Ref Rng & Units 09/09/2021   10:05 AM 02/27/2021   11:36 AM 09/02/2020   10:00 AM  CMP  Glucose 70 - 99 mg/dL 147   140   89    BUN 8 - 23 mg/dL 28   23   37    Creatinine 0.44 - 1.00 mg/dL 1.60   1.36   1.75    Sodium 135 - 145 mmol/L 141   142   141    Potassium 3.5 - 5.1 mmol/L 3.5   3.4   3.5    Chloride 98 - 111 mmol/L 107   107   107    CO2 22 - 32 mmol/L 26   25   27     Calcium 8.9 - 10.3 mg/dL 9.6   8.9   9.7    Total Protein 6.5 - 8.1 g/dL 6.8   5.8   6.1    Total Bilirubin 0.3 - 1.2 mg/dL 0.7   0.5   0.5    Alkaline Phos 38 - 126 U/L 61   61   57    AST 15 - 41 U/L 24   24   23     ALT 0 - 44 U/L 10   13   13         RADIOGRAPHIC STUDIES: I  have personally reviewed the radiological images as listed and agreed with the findings in the report. No results found.   ASSESSMENT & PLAN: Stacy Walls is a 88 y.o. female with    1. Cancer of central right breast, invasive carcinoma, stage IB, pT2c(m)N2aM0, ER+/PR+, HER2-, Grade II -diagnosed in 03/2018. She underwent right breast lumpectomy and axillary lymph node dissection on 06/07/18. 7/14 positive lymph nodes.  -Dr. Burr Walls did not recommend adjuvant chemotherapy given her age, medical comorbidities, and lobular histology which is less sensitive to chemotherapy -She began adjuvant Tamoxifen in 06/2018 prior to radiation due to covid19-related delay. Plan to continue 5-10 years to reduce risk of distant cancer recurrence.  -to reduce local recurrence risk, she completed adjuvant radiation per Dr. Isidore Walls on 11/25/18. She tolerated moderately well with fatigue, skin hyperpigmentation and swelling, wears compression bra during the day.  She still avoids BP and venipuncture on the right extremity -Stacy Walls is clinically doing well.  Tolerating tamoxifen without significant side effects.  Breast exam is benign.  CBC and CMP are stable with mild thrombocytopenia and chronic CKD, followed by nephrology.  Mammogram 05/2021 was negative.  Overall no clinical concern for breast cancer recurrence.  -Continue surveillance and tamoxifen. -Next routine surveillance visit in 6 months, or sooner if needed   2. Iron deficient anemia and anemia  of chronic disease secondary to CKD  -She was previously being treated with monthly EPO injections by Stacy Walls which was stopped due to possible tumor growth.  -Hgb 10-11 range since 02/2020 -Normal hemoglobin today, 12.0   3. CKD, stage III, HTN, HL, DM -On Lasix, hydralazine, metoprolol, lipitor.  Recently added Wilder Glade -Managed by PCP, nephrologist and cardiologist.  -SCr fluctuates, continue follow-up with Stacy Walls   4. Osteoporosis, OA  -Seen on  DEXA from 03/2014 with lowest T-score of -2.6 in femoral neck.  -She ambulates with cane  -DEXA on 11/2018 shows osteoporosis lowest T score -2.8. On tamoxifen which has bone strengthening qualities. Continue calcium and vitamin D while increasing weight bearing  -Repeat DEXA 04/02/2021 showed persistent osteoporosis but improved in the right femur back to T -2.6  5.  Routine health maintenance -Patient reportedly never had a colonoscopy.  -I encouraged her to continue healthy active lifestyle as tolerated and follow-up with her primary medical team  Plan: -Labs, mammogram, DEXA reviewed -No clinical concern for recurrence, continue surveillance and tamoxifen (refilled) -Next surveillance visit with lab in 6 months, or sooner if needed   No problem-specific Assessment & Plan notes found for this encounter.   No orders of the defined types were placed in this encounter.  All questions were answered. The patient knows to call the clinic with any problems, questions or concerns. No barriers to learning were detected.     Stacy Feeling, NP 09/09/21

## 2021-09-09 ENCOUNTER — Inpatient Hospital Stay (HOSPITAL_BASED_OUTPATIENT_CLINIC_OR_DEPARTMENT_OTHER): Payer: Medicare HMO | Admitting: Nurse Practitioner

## 2021-09-09 ENCOUNTER — Other Ambulatory Visit: Payer: Self-pay

## 2021-09-09 ENCOUNTER — Encounter: Payer: Self-pay | Admitting: Nurse Practitioner

## 2021-09-09 ENCOUNTER — Inpatient Hospital Stay: Payer: Medicare HMO | Attending: Nurse Practitioner

## 2021-09-09 VITALS — BP 171/69 | HR 69 | Temp 97.6°F | Resp 17 | Ht 62.0 in | Wt 218.1 lb

## 2021-09-09 DIAGNOSIS — M1990 Unspecified osteoarthritis, unspecified site: Secondary | ICD-10-CM | POA: Diagnosis not present

## 2021-09-09 DIAGNOSIS — Z170 Estrogen receptor positive status [ER+]: Secondary | ICD-10-CM

## 2021-09-09 DIAGNOSIS — C50111 Malignant neoplasm of central portion of right female breast: Secondary | ICD-10-CM | POA: Diagnosis not present

## 2021-09-09 DIAGNOSIS — I129 Hypertensive chronic kidney disease with stage 1 through stage 4 chronic kidney disease, or unspecified chronic kidney disease: Secondary | ICD-10-CM | POA: Diagnosis not present

## 2021-09-09 DIAGNOSIS — N189 Chronic kidney disease, unspecified: Secondary | ICD-10-CM | POA: Diagnosis not present

## 2021-09-09 DIAGNOSIS — M810 Age-related osteoporosis without current pathological fracture: Secondary | ICD-10-CM | POA: Insufficient documentation

## 2021-09-09 DIAGNOSIS — Z79810 Long term (current) use of selective estrogen receptor modulators (SERMs): Secondary | ICD-10-CM | POA: Insufficient documentation

## 2021-09-09 DIAGNOSIS — D631 Anemia in chronic kidney disease: Secondary | ICD-10-CM | POA: Insufficient documentation

## 2021-09-09 DIAGNOSIS — Z923 Personal history of irradiation: Secondary | ICD-10-CM | POA: Insufficient documentation

## 2021-09-09 LAB — CBC WITH DIFFERENTIAL (CANCER CENTER ONLY)
Abs Immature Granulocytes: 0.01 10*3/uL (ref 0.00–0.07)
Basophils Absolute: 0 10*3/uL (ref 0.0–0.1)
Basophils Relative: 0 %
Eosinophils Absolute: 0.1 10*3/uL (ref 0.0–0.5)
Eosinophils Relative: 3 %
HCT: 37.2 % (ref 36.0–46.0)
Hemoglobin: 12 g/dL (ref 12.0–15.0)
Immature Granulocytes: 0 %
Lymphocytes Relative: 33 %
Lymphs Abs: 1.6 10*3/uL (ref 0.7–4.0)
MCH: 30.7 pg (ref 26.0–34.0)
MCHC: 32.3 g/dL (ref 30.0–36.0)
MCV: 95.1 fL (ref 80.0–100.0)
Monocytes Absolute: 0.4 10*3/uL (ref 0.1–1.0)
Monocytes Relative: 9 %
Neutro Abs: 2.7 10*3/uL (ref 1.7–7.7)
Neutrophils Relative %: 55 %
Platelet Count: 100 10*3/uL — ABNORMAL LOW (ref 150–400)
RBC: 3.91 MIL/uL (ref 3.87–5.11)
RDW: 12.3 % (ref 11.5–15.5)
WBC Count: 4.9 10*3/uL (ref 4.0–10.5)
nRBC: 0 % (ref 0.0–0.2)

## 2021-09-09 LAB — CMP (CANCER CENTER ONLY)
ALT: 10 U/L (ref 0–44)
AST: 24 U/L (ref 15–41)
Albumin: 3.8 g/dL (ref 3.5–5.0)
Alkaline Phosphatase: 61 U/L (ref 38–126)
Anion gap: 8 (ref 5–15)
BUN: 28 mg/dL — ABNORMAL HIGH (ref 8–23)
CO2: 26 mmol/L (ref 22–32)
Calcium: 9.6 mg/dL (ref 8.9–10.3)
Chloride: 107 mmol/L (ref 98–111)
Creatinine: 1.6 mg/dL — ABNORMAL HIGH (ref 0.44–1.00)
GFR, Estimated: 31 mL/min — ABNORMAL LOW (ref >60)
Glucose, Bld: 147 mg/dL — ABNORMAL HIGH (ref 70–99)
Potassium: 3.5 mmol/L (ref 3.5–5.1)
Sodium: 141 mmol/L (ref 135–145)
Total Bilirubin: 0.7 mg/dL (ref 0.3–1.2)
Total Protein: 6.8 g/dL (ref 6.5–8.1)

## 2021-09-09 MED ORDER — TAMOXIFEN CITRATE 20 MG PO TABS: 20.0000 mg | ORAL_TABLET | Freq: Every day | ORAL | 3 refills | Status: DC

## 2021-09-10 LAB — CANCER ANTIGEN 27.29: CA 27.29: 26.6 U/mL (ref 0.0–38.6)

## 2021-10-15 ENCOUNTER — Ambulatory Visit (INDEPENDENT_AMBULATORY_CARE_PROVIDER_SITE_OTHER): Payer: Medicare HMO | Admitting: Podiatry

## 2021-10-15 ENCOUNTER — Encounter: Payer: Self-pay | Admitting: Podiatry

## 2021-10-15 DIAGNOSIS — M79675 Pain in left toe(s): Secondary | ICD-10-CM | POA: Diagnosis not present

## 2021-10-15 DIAGNOSIS — B351 Tinea unguium: Secondary | ICD-10-CM | POA: Diagnosis not present

## 2021-10-15 DIAGNOSIS — N1832 Chronic kidney disease, stage 3b: Secondary | ICD-10-CM | POA: Diagnosis not present

## 2021-10-15 DIAGNOSIS — E1122 Type 2 diabetes mellitus with diabetic chronic kidney disease: Secondary | ICD-10-CM

## 2021-10-15 DIAGNOSIS — M79674 Pain in right toe(s): Secondary | ICD-10-CM

## 2021-10-22 NOTE — Progress Notes (Signed)
  Subjective:  Patient ID: Stacy Walls, female    DOB: 15-Jul-1932,  MRN: [id number]  Stacy Walls presents to clinic today for at risk foot care. Pt has h/o NIDDM with chronic kidney disease and painful elongated mycotic toenails 1-5 bilaterally which are tender when wearing enclosed shoe gear. Pain is relieved with periodic professional debridement.  Patient states blood glucose was 127 mg/dl yesterday. Last known HgA1c was unknown.   New problem(s): None.   PCP is Sonia Side., FNP , and last visit was Sep 11, 2021.  Allergies  Allergen Reactions   Ace Inhibitors Cough   Azithromycin Other (See Comments)    Unknown   Pioglitazone Other (See Comments)    REACTION: edema   Sulfonamide Derivatives Rash   Tramadol Other (See Comments)    dizziness   Amlodipine Other (See Comments)    Unfavorable reaction/headache    Review of Systems: Negative except as noted in the HPI.  Objective: No changes noted in today's physical examination. Stacy Walls is a pleasant 88 y.o. female in NAD. AAO X 3.  Vascular Examination: Capillary refill time to digits immediate b/l. Palpable DP pulse(s) b/l lower extremities Palpable PT pulse(s) b/l lower extremities Pedal hair sparse. No pain with calf compression b/l. Lower extremity skin temperature gradient within normal limits. Nonpitting edema noted bilateral ankles.  Dermatological Examination: Pedal integument with normal turgor, texture and tone BLE. No open wounds b/l LE. No interdigital macerations noted b/l LE. Toenails 1-5 b/l elongated, discolored, dystrophic, thickened, crumbly with subungual debris and tenderness to dorsal palpation. No hyperkeratotic nor porokeratotic lesions present on today's visit.  Musculoskeletal Examination: Normal muscle strength 5/5 to all lower extremity muscle groups bilaterally. Pes planus deformity noted b/l lower extremities.  Patient ambulates with cane  assistance.  Neurological Examination: Protective sensation intact 5/5 intact bilaterally with 10g monofilament b/l. Vibratory sensation intact b/l. Proprioception intact bilaterally. Deep tendon reflexes normal b/l.   Assessment/Plan: 1. Pain due to onychomycosis of toenails of both feet   2. Type 2 diabetes mellitus with stage 3b chronic kidney disease, without long-term current use of insulin ([place])     -Examined patient. -Continue foot and shoe inspections daily. Monitor blood glucose per PCP/Endocrinologist's recommendations. -Patient to continue soft, supportive shoe gear daily. -Toenails 1-5 b/l were debrided in length and girth with sterile nail nippers and dremel without iatrogenic bleeding.  -Patient/POA to call should there be question/concern in the interim.   Return in about 3 months (around 01/15/2022).  Marzetta Board, DPM

## 2021-11-18 ENCOUNTER — Telehealth: Payer: Self-pay | Admitting: *Deleted

## 2021-11-18 NOTE — Telephone Encounter (Signed)
Faxed last office notes to [facility], attn: [phone] per patient's request, 11/18/21.

## 2021-11-19 DIAGNOSIS — M79641 Pain in right hand: Secondary | ICD-10-CM | POA: Insufficient documentation

## 2021-11-27 ENCOUNTER — Other Ambulatory Visit: Payer: Self-pay | Admitting: Cardiology

## 2022-01-21 ENCOUNTER — Ambulatory Visit: Payer: Medicare HMO | Admitting: Podiatry

## 2022-02-01 NOTE — Progress Notes (Unsigned)
Office Visit    Patient Name: Stacy Walls Date of Encounter: 02/02/2022  PCP:  Sonia Side., [facility]  Cardiologist:  Candee Furbish, MD  Advanced Practice Provider:  No care team member to display Electrophysiologist:  None   HPI    Stacy Walls is a 88 y.o. female with a past medical history significant for coronary artery disease, hypertension, diabetes, hyperlipidemia, chronic kidney disease, and breast cancer status post partial cystectomy in 2020 with radiation and tamoxifen presents today for annual follow-up visit.  She had coronary calcifications on CT scan in 2020 with aortic atherosclerosis as well.  Back in November 2020 she was admitted with weakness and mental status changes with headache and hyponatremia along with acute kidney injury.  She had prolonged QT on EKG after being treated with Levaquin for UTI.  HCTZ was stopped and her QT and sodium level improved.  After the hospital stay she went to inpatient rehab.  Her blood pressure was challenging to control.  BNP was elevated.  She was slowly getting stronger and stronger.  She was seen last year and at that time she had not been feeling well.  She had a sinus infection but received medication for it and it was resolving.  She was feeling better and dizziness was improving.  Blood pressure had been well controlled per her primary doctor.  She had also been compliant on her thyroid meds.  Today, she has some chest pain occasionally and after 1 nitroglycerin it goes away.  No shortness of breath or fluttering in her chest during that time.  We discussed if it becomes more frequent or more severe she is to call our office for an ischemic work-up.  Overall, she feels well.  She is unsure of her medications today so we have arranged for her to meet with one of our Pharm.D.'s.  She is tolerating all her medications.  EKG was reviewed with the patient.  Reports no  shortness of breath nor dyspnea on exertion.  No  orthopnea, PND. Reports no palpitations.    Past Medical History    Past Medical History:  Diagnosis Date   Acute cholecystitis    Allergic rhinitis    Anemia    Arthritis    Cancer (HCC)    Right breast   Chest pain    Chronic renal insufficiency    followed by Dr Justin Mend stage 3   Clostridium difficile colitis    Constipation    Coronary atherosclerosis of native coronary vessel    Cough    Depression    situational - husband died May 15, 2018   Diabetes mellitus    Dizziness    DM2 (diabetes mellitus, type 2) ([place])    Dyshidrosis    Family history of adverse reaction to anesthesia    daughter - nausea   GERD (gastroesophageal reflux disease)    Gout    HLD (hyperlipidemia)    HTN (hypertension)    Hypothyroidism    Neck mass    Neck pain    Osteoporosis    Personal history of radiation therapy 12/2018   Routine general medical examination at a health care facility    Secondary hyperparathyroidism (of renal origin)    Urinary incontinence    Urinary tract infection    Past Surgical History:  Procedure Laterality Date   bone density  08/21/05   BREAST BIOPSY Right 03/30/2018   x3   BREAST BIOPSY Right  04/25/2018   BREAST LUMPECTOMY Right 06/07/2018   BREAST LUMPECTOMY WITH RADIOACTIVE SEED AND AXILLARY LYMPH NODE DISSECTION Right 06/07/2018   Procedure: RIGHT BREAST LUMPECTOMY WITH BRACKETED RADIOACTIVE SEEDS AND RIGHT COMPLETE AXILLARY LYMPH NODE DISSECTION;  Surgeon: Fanny Skates, MD;  Location: [facility];  Service: General;  Laterality: Right;   C-section (other)  1977   CHOLECYSTECTOMY     ELECTROCARDIOGRAM  02/01/06   L ankle surgery Left 1988   total left knee Left 2007    Allergies  Allergies  Allergen Reactions   Ace Inhibitors Cough   Azithromycin Other (See Comments)    Unknown   Pioglitazone Other (See Comments)    REACTION: edema   Sulfonamide Derivatives Rash   Tramadol Other (See Comments)     dizziness   Amlodipine Other (See Comments)    Unfavorable reaction/headache    EKGs/Labs/Other Studies Reviewed:   The following studies were reviewed today:  Echo 03/18/2019  IMPRESSIONS     1. Left ventricular ejection fraction, by visual estimation, is 60 to  65%. The left ventricle has normal function. There is mildly increased  left ventricular hypertrophy.   2. Left ventricular diastolic parameters are consistent with Grade I  diastolic dysfunction (impaired relaxation).   3. Global right ventricle has normal systolic function.The right  ventricular size is normal. No increase in right ventricular wall  thickness.   4. Left atrial size was severely dilated.   5. Right atrial size was mildly dilated.   6. Mild mitral annular calcification.   7. The mitral valve is mildly calcified. Moderate mitral valve  regurgitation.   8. The tricuspid valve is normal in structure. Tricuspid valve  regurgitation is trivial.   9. The aortic valve is normal in structure. Aortic valve regurgitation is  trivial.  10. The pulmonic valve was normal in structure. Pulmonic valve  regurgitation is trivial.  11. Moderately elevated pulmonary artery systolic pressure.  12. The inferior vena cava is normal in size with <50% respiratory  variability, suggesting right atrial pressure of 8 mmHg.   FINDINGS   Left Ventricle: Left ventricular ejection fraction, by visual estimation,  is 60 to 65%. The left ventricle has normal function. There is mildly  increased left ventricular hypertrophy. Left ventricular diastolic  parameters are consistent with Grade I  diastolic dysfunction (impaired relaxation).   Right Ventricle: The right ventricular size is normal. No increase in  right ventricular wall thickness. Global RV systolic function is has  normal systolic function. The tricuspid regurgitant velocity is 3.39 m/s,  and with an assumed right atrial pressure   of 10 mmHg, the estimated right  ventricular systolic pressure is  moderately elevated at 56.0 mmHg.   Left Atrium: Left atrial size was severely dilated.   Right Atrium: Right atrial size was mildly dilated   Pericardium: There is no evidence of pericardial effusion.   Mitral Valve: The mitral valve is normal in structure. There is mild  calcification of the mitral valve leaflet(s). Mild mitral annular  calcification. Moderate mitral valve regurgitation.   Tricuspid Valve: The tricuspid valve is normal in structure. Tricuspid  valve regurgitation is trivial.   Aortic Valve: The aortic valve is normal in structure. Aortic valve  regurgitation is trivial.   Pulmonic Valve: The pulmonic valve was normal in structure. Pulmonic valve  regurgitation is trivial.   Aorta: The aortic root and ascending aorta are structurally normal, with  no evidence of dilitation.   Venous: The inferior vena cava is normal  in size with less than 50%  respiratory variability, suggesting right atrial pressure of 8 mmHg.   IAS/Shunts: No atrial level shunt detected by color flow Doppler.    EKG:  EKG is  ordered today.  The ekg ordered today demonstrates normal sinus rhythm, rate 67 bpm  Recent Labs: 09/09/2021: ALT 10; BUN 28; Creatinine 1.60; Hemoglobin 12.0; Platelet Count 100; Potassium 3.5; Sodium 141  Recent Lipid Panel    Component Value Date/Time   CHOL 260 (H) 09/07/2017 1130   TRIG 274.0 (H) 09/07/2017 1130   HDL 39.70 09/07/2017 1130   CHOLHDL 7 09/07/2017 1130   VLDL 54.8 (H) 09/07/2017 1130   LDLCALC 86 04/01/2015 1200   LDLDIRECT 159.0 09/07/2017 1130    Home Medications   Current Meds  Medication Sig   acetaminophen (TYLENOL) 325 MG tablet Take 1-2 tablets (325-650 mg total) by mouth every 4 (four) hours as needed for mild pain.   allopurinol (ZYLOPRIM) 100 MG tablet Take 100 mg by mouth daily.   aspirin EC 81 MG tablet Take 81 mg by mouth daily.   atorvastatin (LIPITOR) 40 MG tablet Take 1 tablet (40 mg  total) by mouth daily.   cetirizine (ZYRTEC) 10 MG tablet Take 10 mg by mouth daily.    dapagliflozin propanediol (FARXIGA) 10 MG TABS tablet Take 10 mg by mouth daily.   famotidine (PEPCID) 20 MG tablet Take 20 mg by mouth 2 (two) times daily.   Ferrous Sulfate (IRON) 325 (65 Fe) MG TABS Take 1 tablet (325 mg total) by mouth daily.   fluticasone (FLONASE) 50 MCG/ACT nasal spray Place into both nostrils.   furosemide (LASIX) 40 MG tablet Take 40 mg by mouth daily.   hydrALAZINE (APRESOLINE) 50 MG tablet Take 50 mg by mouth 2 (two) times daily.   isosorbide mononitrate (ISMO) 10 MG tablet Take 10 mg by mouth every morning.   levothyroxine (SYNTHROID) 88 MCG tablet Take 88 mcg by mouth daily.   Multiple Vitamins-Minerals (MULTIVITAMIN WOMEN 50+ PO) Take 1 tablet by mouth daily.    nitroGLYCERIN (NITROSTAT) 0.4 MG SL tablet PLACE 1 TABLET(0.4 MG TOTAL) UNDER THE TONGUE EVERY 5(FIVE) MINUTES AS NEEDED FOR CHEST PAIN.   omeprazole (PRILOSEC) 20 MG capsule TAKE 1 CAPSULE(20 MG) BY MOUTH DAILY     Review of Systems      All other systems reviewed and are otherwise negative except as noted above.  Physical Exam    VS:  BP 130/64   Pulse 67   Ht '5\' 2"'$  (1.575 m)   Wt 204 lb 12.8 oz (92.9 kg)   SpO2 95%   BMI 37.46 kg/m  , BMI Body mass index is 37.46 kg/m.  Wt Readings from Last 3 Encounters:  02/02/22 204 lb 12.8 oz (92.9 kg)  09/09/21 218 lb 1.6 oz (98.9 kg)  02/27/21 217 lb 4.8 oz (98.6 kg)     GEN: Well nourished, well developed, in no acute distress. HEENT: normal. Neck: Supple, no JVD, carotid bruits, or masses. Cardiac: RRR, no murmurs, rubs, or gallops. No clubbing, cyanosis,  1+ non pitting edema.  Radials/PT 2+ and equal bilaterally.  Respiratory:  Respirations regular and unlabored, clear to auscultation bilaterally. GI: Soft, nontender, nondistended. MS: No deformity or atrophy. Skin: Warm and dry, no rash. Neuro:  Strength and sensation are intact. Psych: Normal  affect.  Assessment & Plan    Chest pain of uncertain etiology/CAD -chests pains here and there. Relieved with one Nitro -if they progress she will let  our office know -Continue current medication regimen with ASA 81 mg daily, Lipitor 40 mg daily, Lasix 40 mg daily, hydralazine 50 mg twice daily, isosorbide mononitrate 10 mg da, Lopressor 12.5 mg twice daily, Farxiga 10 mg daily -We will arrange a Pharm.D. appointment to go over her medications  2. Chronic diastolic heart failure secondary pulmonary hypertension from diastolic dysfunction -ankles swell during the day but then goes away during the night -encouraged to wear compression -daily weights -continue current medication regimen  3. CKD stage III -1.6 creatinine  -PCP to get labs in November and we would like those sent to [place]       Disposition: Follow up 1 year with Candee Furbish, MD or APP.  Signed, Elgie Collard, PA-C 02/02/2022, 5:23 PM [facility]

## 2022-02-02 ENCOUNTER — Encounter: Payer: Self-pay | Admitting: Physician Assistant

## 2022-02-02 ENCOUNTER — Ambulatory Visit: Payer: Medicare HMO | Attending: Physician Assistant | Admitting: Physician Assistant

## 2022-02-02 VITALS — BP 130/64 | HR 67 | Ht 62.0 in | Wt 204.8 lb

## 2022-02-02 DIAGNOSIS — I5032 Chronic diastolic (congestive) heart failure: Secondary | ICD-10-CM | POA: Diagnosis not present

## 2022-02-02 DIAGNOSIS — N1832 Chronic kidney disease, stage 3b: Secondary | ICD-10-CM

## 2022-02-02 DIAGNOSIS — I2510 Atherosclerotic heart disease of native coronary artery without angina pectoris: Secondary | ICD-10-CM

## 2022-02-02 DIAGNOSIS — Z79899 Other long term (current) drug therapy: Secondary | ICD-10-CM

## 2022-02-02 NOTE — Patient Instructions (Addendum)
Medication Instructions:  Your physician recommends that you continue on your current medications as directed. Please refer to the Current Medication list given to you today.  *If you need a refill on your cardiac medications before your next appointment, please call your pharmacy*   Lab Work: Have your primary care provider fax your lab results to [place] when you have them drawn in November--[phone] If you have labs (blood work) drawn today and your tests are completely normal, you will receive your results only by: [place] (if you have MyChart) OR A paper copy in the mail If you have any lab test that is abnormal or we need to change your treatment, we will call you to review the results.   Follow-Up: At [facility], you and your health needs are our priority.  As part of our continuing mission to provide you with exceptional heart care, we have created designated Provider Care Teams.  These Care Teams include your primary Cardiologist (physician) and Advanced Practice Providers (APPs -  Physician Assistants and Nurse Practitioners) who all work together to provide you with the care you need, when you need it.  We recommend signing up for the patient portal called "MyChart".  Sign up information is provided on this After Visit Summary.  MyChart is used to connect with patients for Virtual Visits (Telemedicine).  Patients are able to view lab/test results, encounter notes, upcoming appointments, etc.  Non-urgent messages can be sent to your provider as well.   To learn more about what you can do with MyChart, go to [email or web address].    Your next appointment:   1 year(s)  The format for your next appointment:   In Person  Provider:   Candee Furbish, MD    Other Instructions You have been referred to see the pharmacist here in our office to discuss medication management   Important Information About Sugar

## 2022-02-18 ENCOUNTER — Ambulatory Visit: Payer: Medicare HMO | Admitting: Podiatry

## 2022-02-25 ENCOUNTER — Other Ambulatory Visit: Payer: Self-pay | Admitting: Cardiology

## 2022-03-02 ENCOUNTER — Other Ambulatory Visit: Payer: Self-pay

## 2022-03-02 MED ORDER — FUROSEMIDE 40 MG PO TABS: 40.0000 mg | ORAL_TABLET | Freq: Every day | ORAL | 3 refills | Status: DC

## 2022-03-16 ENCOUNTER — Other Ambulatory Visit: Payer: Self-pay

## 2022-03-16 ENCOUNTER — Inpatient Hospital Stay: Payer: Medicare HMO | Admitting: Nurse Practitioner

## 2022-03-16 ENCOUNTER — Inpatient Hospital Stay: Payer: Medicare HMO | Attending: Nurse Practitioner

## 2022-03-16 ENCOUNTER — Encounter: Payer: Self-pay | Admitting: Nurse Practitioner

## 2022-03-16 VITALS — BP 159/61 | HR 55 | Temp 97.8°F | Resp 15 | Wt 205.0 lb

## 2022-03-16 DIAGNOSIS — C50111 Malignant neoplasm of central portion of right female breast: Secondary | ICD-10-CM | POA: Insufficient documentation

## 2022-03-16 DIAGNOSIS — Z170 Estrogen receptor positive status [ER+]: Secondary | ICD-10-CM | POA: Insufficient documentation

## 2022-03-16 DIAGNOSIS — Z923 Personal history of irradiation: Secondary | ICD-10-CM | POA: Insufficient documentation

## 2022-03-16 DIAGNOSIS — Z79810 Long term (current) use of selective estrogen receptor modulators (SERMs): Secondary | ICD-10-CM | POA: Diagnosis not present

## 2022-03-16 LAB — CBC WITH DIFFERENTIAL (CANCER CENTER ONLY)
Abs Immature Granulocytes: 0.02 10*3/uL (ref 0.00–0.07)
Basophils Absolute: 0 10*3/uL (ref 0.0–0.1)
Basophils Relative: 1 %
Eosinophils Absolute: 0.2 10*3/uL (ref 0.0–0.5)
Eosinophils Relative: 4 %
HCT: 33.6 % — ABNORMAL LOW (ref 36.0–46.0)
Hemoglobin: 10.7 g/dL — ABNORMAL LOW (ref 12.0–15.0)
Immature Granulocytes: 0 %
Lymphocytes Relative: 29 %
Lymphs Abs: 1.5 10*3/uL (ref 0.7–4.0)
MCH: 30.3 pg (ref 26.0–34.0)
MCHC: 31.8 g/dL (ref 30.0–36.0)
MCV: 95.2 fL (ref 80.0–100.0)
Monocytes Absolute: 0.6 10*3/uL (ref 0.1–1.0)
Monocytes Relative: 11 %
Neutro Abs: 2.9 10*3/uL (ref 1.7–7.7)
Neutrophils Relative %: 55 %
Platelet Count: 173 10*3/uL (ref 150–400)
RBC: 3.53 MIL/uL — ABNORMAL LOW (ref 3.87–5.11)
RDW: 12.4 % (ref 11.5–15.5)
WBC Count: 5.3 10*3/uL (ref 4.0–10.5)
nRBC: 0 % (ref 0.0–0.2)

## 2022-03-16 LAB — CMP (CANCER CENTER ONLY)
ALT: 10 U/L (ref 0–44)
AST: 19 U/L (ref 15–41)
Albumin: 3.6 g/dL (ref 3.5–5.0)
Alkaline Phosphatase: 44 U/L (ref 38–126)
Anion gap: 8 (ref 5–15)
BUN: 26 mg/dL — ABNORMAL HIGH (ref 8–23)
CO2: 28 mmol/L (ref 22–32)
Calcium: 10.1 mg/dL (ref 8.9–10.3)
Chloride: 104 mmol/L (ref 98–111)
Creatinine: 1.7 mg/dL — ABNORMAL HIGH (ref 0.44–1.00)
GFR, Estimated: 29 mL/min — ABNORMAL LOW (ref >60)
Glucose, Bld: 152 mg/dL — ABNORMAL HIGH (ref 70–99)
Potassium: 3.2 mmol/L — ABNORMAL LOW (ref 3.5–5.1)
Sodium: 140 mmol/L (ref 135–145)
Total Bilirubin: 0.6 mg/dL (ref 0.3–1.2)
Total Protein: 6.6 g/dL (ref 6.5–8.1)

## 2022-03-16 MED ORDER — POTASSIUM CHLORIDE CRYS ER 20 MEQ PO TBCR: 20.0000 meq | EXTENDED_RELEASE_TABLET | Freq: Every day | ORAL | 0 refills | Status: DC

## 2022-03-16 NOTE — Progress Notes (Signed)
[place]   [phone] [phone]   Clinic Follow up Note   Patient Care Team: Sonia Side., FNP as PCP - General (Family Medicine) Jerline Pain, MD as PCP - Cardiology (Cardiology) Edrick Oh, MD (Nephrology) Maia Breslow, MD (Orthopedic Surgery) Newton Pigg, MD as Consulting Physician (Obstetrics and Gynecology) Truitt Merle, MD as Consulting Physician (Hematology) Fanny Skates, MD as Consulting Physician (General Surgery) Alla Feeling, NP as Nurse Practitioner (Nurse Practitioner) Eppie Gibson, MD as Attending Physician (Radiation Oncology) 03/16/2022  CHIEF COMPLAINT: Follow up right breast cancer   SUMMARY OF ONCOLOGIC HISTORY: Oncology History Overview Note  Cancer Staging Cancer of central portion of right female breast [facility]) Staging form: Breast, AJCC 8th Edition - Clinical stage from 03/30/2018: Stage IB (cT1c(m), cN1, cM0, G2, ER+, PR+, HER2-) - Signed by Truitt Merle, MD on 04/08/2018 - Pathologic stage from 06/07/2018: Stage IB (pT2(m), pN2a, cM0, G2, ER+, PR+, HER2-) - Signed by Gardenia Phlegm, NP on 06/22/2018     Cancer of central portion of right female breast ([place])  01/13/2018 Mammogram   Diagnostic Mammogram 01/13/18  RECOMMENDATION: 1. Ultrasound-guided biopsies of both masses in the RIGHT breast identified by ultrasound at the 12 o'clock axis (7 cm from the nipple measuring 1.1 x 0.8 x 0.7 cm and 5 cm from the nipple measuring 1 x 0.8 x 0.9 cm respectively). 2. Ultrasound-guided biopsy of the most prominent lymph node in the RIGHT axilla. 3. Postprocedure mammogram to ensure that the masses correspond to the extent of the asymmetry/distortion seen on mammogram. Additional stereotactic biopsy may be needed if the clips do not approximate the anterior and posterior extents. 4. Depending on the location of the post biopsy clips, and if breast conservation surgery is considered, stereotactic biopsy  may be also needed for the coarse heterogeneous calcifications within the slightly outer RIGHT breast, measuring 8 mm extent, located 1.2 cm from the asymmetry/distortion.   03/30/2018 Cancer Staging   Staging form: Breast, AJCC 8th Edition - Clinical stage from 03/30/2018: Stage IB (cT1c(m), cN1, cM0, G2, ER+, PR+, HER2-) - Signed by Truitt Merle, MD on 04/08/2018   03/30/2018 Initial Biopsy   Diagnosis 03/30/18 1. Breast, right, needle core biopsy, 12 o'clock position, 5cm from nipple - INVASIVE LOBULAR CARCINOMA, GRADE 2. SEE NOTE. - LOBULAR CARCINOMA IN SITU, INTERMEDIATE NUCLEAR GRADE. 2. Breast, right, needle core biopsy, 12 o'clock position 7cfn - INVASIVE LOBULAR CARCINOMA, GRADE 2. SEE NOTE. 1 of 3 FINAL for Beyl, Liviana R ([id number]) Diagnosis(continued) - LOBULAR CARCINOMA IN SITU, INTERMEDIATE NUCLEAR GRADE. 3. Lymph node, needle/core biopsy, right axilla - METASTATIC LOBULAR CARCINOMA TO LYMPH NODE.   03/30/2018 Receptors her2   Results: IMMUNOHISTOCHEMICAL AND MORPHOMETRIC ANALYSIS PERFORMED MANUALLY The tumor cells are NEGATIVE for Her2 (1+). Estrogen Receptor: 100%, POSITIVE, STRONG STAINING INTENSITY Progesterone Receptor: 70%, POSITIVE, STRONG STAINING INTENSITY Proliferation Marker Ki67: 10%   04/08/2018 Initial Diagnosis   Cancer of central portion of right female breast ([place])   04/27/2018 Imaging   CT CAP W Contrast 04/27/18  IMPRESSION: 1. Right axillary and right subpectoral lymphadenopathy, suspicious for metastatic disease. PET-CT may prove helpful to further evaluate. 2. Cholelithiasis. 3. Colonic diverticulosis without diverticulitis. 4.  Aortic Atherosclerois (ICD10-170.0)   04/28/2018 Imaging   Bone Scan 04/28/18  IMPRESSION: No scintigraphic evidence skeletal metastasis. Severe degenerative uptake in the medial compartment RIGHT knee.   06/07/2018 Cancer Staging   Staging form: Breast, AJCC 8th Edition - Pathologic stage from 06/07/2018:  Stage IB (  pT2(m), pN2a, cM0, G2, ER+, PR+, HER2-) - Signed by Gardenia Phlegm, NP on 06/22/2018   06/07/2018 Surgery   RIGHT BREAST LUMPECTOMY WITH BRACKETED RADIOACTIVE SEEDS AND RIGHT COMPLETE AXILLARY LYMPH NODE DISSECTION by Dr. Dalbert Batman 06/07/18    06/07/2018 Pathology Results   Diagnosis 06/07/18 1. Breast, lumpectomy, right with radioactive seeds - INVASIVE LOBULAR CARCINOMA, MULTIFOCAL, LARGER FOCUS IS 3 CM, NOTTINGHAM GRADE 2 OF 3. - MARGINS OF RESECTION ARE NOT INVOLVED (CLOSEST MARGIN: LESS THAN 1 MM, ANTERIOR). - BIOPSY SITE CHANGES. - SEE ONCOLOGY TABLE. 2. Lymph nodes, regional resection, right axillary contents - METASTATIC CARCINOMA PRESENT IN SEVEN OF FOURTEEN LYMPH NODES (7/14). 3. Breast, excision, inferior margin - BREAST PARENCHYMA, NEGATIVE FOR CARCINOMA.   06/2018 -  Anti-estrogen oral therapy   Tamoxifen 75m daily starting in March or April 2020.    10/13/2018 - 11/25/2018 Radiation Therapy   Adjuvant radiation to right breat per Dr. SIsidore Moos  05/31/2019 Survivorship   SCP delivered by LCira Rue NP      CURRENT THERAPY: Tamoxifen 20 mg daily starting March/April 2020  INTERVAL HISTORY: Ms. AStofferreturns for follow up as scheduled, last seen by me 09/09/21. She continues tamoxifen, tolerating well without side effects.  Losing some weight has helped her energy and knee pains.  Denies concerns in her breast such as new lump/mass, nipple discharge or inversion, or skin change.  She recently recovered from an upper respiratory illness.  Mammogram due in Feb.   All other systems were reviewed with the patient and are negative.  MEDICAL HISTORY:  Past Medical History:  Diagnosis Date   Acute cholecystitis    Allergic rhinitis    Anemia    Arthritis    [place]    Right breast   Chest pain    Chronic renal insufficiency    followed by Dr WJustin Mendstage 3   Clostridium difficile colitis    Constipation    Coronary atherosclerosis of native coronary  vessel    Cough    Depression    situational - husband died 1Feb 08, 2020  Diabetes mellitus    Dizziness    DM2 (diabetes mellitus, type 2) ([place]    Dyshidrosis    Family history of adverse reaction to anesthesia    daughter - nausea   GERD (gastroesophageal reflux disease)    Gout    HLD (hyperlipidemia)    HTN (hypertension)    Hypothyroidism    Neck mass    Neck pain    Osteoporosis    Personal history of radiation therapy 12/2018   Routine general medical examination at a health care facility    Secondary hyperparathyroidism (of renal origin)    Urinary incontinence    Urinary tract infection     SURGICAL HISTORY: Past Surgical History:  Procedure Laterality Date   bone density  08/21/05   BREAST BIOPSY Right 03/30/2018   x3   BREAST BIOPSY Right 04/25/2018   BREAST LUMPECTOMY Right 06/07/2018   BREAST LUMPECTOMY WITH RADIOACTIVE SEED AND AXILLARY LYMPH NODE DISSECTION Right 06/07/2018   Procedure: RIGHT BREAST LUMPECTOMY WITH BRACKETED RADIOACTIVE SEEDS AND RIGHT COMPLETE AXILLARY LYMPH NODE DISSECTION;  Surgeon: IFanny Skates MD;  [facility]  Service: General;  Laterality: Right;   C-section (other)  1977   CHOLECYSTECTOMY     ELECTROCARDIOGRAM  02/01/06   L ankle surgery Left 1988   total left knee Left 2007    I have reviewed the social history and family history with  the patient and they are unchanged from previous note.  ALLERGIES:  is allergic to ace inhibitors, azithromycin, pioglitazone, sulfonamide derivatives, tramadol, and amlodipine.  MEDICATIONS:  Current Outpatient Medications  Medication Sig Dispense Refill   acetaminophen (TYLENOL) 325 MG tablet Take 1-2 tablets (325-650 mg total) by mouth every 4 (four) hours as needed for mild pain.     allopurinol (ZYLOPRIM) 100 MG tablet Take 100 mg by mouth daily.     aspirin EC 81 MG tablet Take 81 mg by mouth daily.     atorvastatin (LIPITOR) 40 MG tablet Take 1 tablet (40 mg total) by mouth daily. 90  tablet 1   cetirizine (ZYRTEC) 10 MG tablet Take 10 mg by mouth daily.      dapagliflozin propanediol (FARXIGA) 10 MG TABS tablet Take 10 mg by mouth daily.     famotidine (PEPCID) 20 MG tablet Take 20 mg by mouth 2 (two) times daily.     Ferrous Sulfate (IRON) 325 (65 Fe) MG TABS Take 1 tablet (325 mg total) by mouth daily. 30 tablet 0   fluticasone (FLONASE) 50 MCG/ACT nasal spray Place into both nostrils.     furosemide (LASIX) 40 MG tablet Take 1 tablet (40 mg total) by mouth daily. 90 tablet 3   hydrALAZINE (APRESOLINE) 50 MG tablet Take 50 mg by mouth 2 (two) times daily.     levothyroxine (SYNTHROID) 88 MCG tablet Take 88 mcg by mouth daily.     metoprolol tartrate (LOPRESSOR) 25 MG tablet Take 0.5 tablets (12.5 mg total) by mouth 2 (two) times daily. 90 tablet 3   Multiple Vitamins-Minerals (MULTIVITAMIN WOMEN 50+ PO) Take 1 tablet by mouth daily.      nitroGLYCERIN (NITROSTAT) 0.4 MG SL tablet PLACE 1 TABLET(0.4 MG TOTAL) UNDER THE TONGUE EVERY 5(FIVE) MINUTES AS NEEDED FOR CHEST PAIN. 25 tablet 8   potassium chloride SA (KLOR-CON M) 20 MEQ tablet Take 1 tablet (20 mEq total) by mouth daily. 30 tablet 0   tamoxifen (NOLVADEX) 20 MG tablet Take 1 tablet (20 mg total) by mouth daily. 90 tablet 3   isosorbide mononitrate (ISMO) 10 MG tablet Take 10 mg by mouth every morning. (Patient not taking: Reported on 03/16/2022)     omeprazole (PRILOSEC) 20 MG capsule TAKE 1 CAPSULE(20 MG) BY MOUTH DAILY (Patient not taking: Reported on 03/16/2022) 90 capsule 0   No current facility-administered medications for this visit.    PHYSICAL EXAMINATION: ECOG PERFORMANCE STATUS: 1 - Symptomatic but completely ambulatory  Vitals:   03/16/22 1007  BP: (!) 159/61  Pulse: (!) 55  Resp: 15  Temp: 97.8 F (36.6 C)  SpO2: 100%   Filed Weights   03/16/22 1007  Weight: 205 lb (93 kg)    GENERAL:alert, no distress and comfortable SKIN: No rash EYES: sclera clear NECK: Without mass LYMPH:  no  palpable cervical or supraclavicular lymphadenopathy LUNGS: clear with normal breathing effort HEART: regular rate & rhythm NEURO: alert & oriented x 3 with fluent speech Breast exam: Symmetrical without nipple discharge or inversion.  S/p right lumpectomy and radiation, incisions completely healed.  Mild hyperpigmentation in lymphedema in the central breast.  No palpable mass or nodularity in either breast or axilla that I could appreciate on seated exam performed in wheelchair  LABORATORY DATA:  I have reviewed the data as listed    Latest Ref Rng & Units 03/16/2022    9:52 AM 09/09/2021   10:05 AM 02/27/2021   11:36 AM  CBC  WBC 4.0 -  10.5 K/uL 5.3  4.9  4.6   Hemoglobin 12.0 - 15.0 g/dL 10.7  12.0  10.7   Hematocrit 36.0 - 46.0 % 33.6  37.2  33.7   Platelets 150 - 400 K/uL 173  100  122         Latest Ref Rng & Units 03/16/2022    9:52 AM 09/09/2021   10:05 AM 02/27/2021   11:36 AM  CMP  Glucose 70 - 99 mg/dL 152  147  140   BUN 8 - 23 mg/dL _0 Creatinine 0.44 - 1.00 mg/dL 1.70  1.60  1.36   Sodium 135 - 145 mmol/L 140  141  142   Potassium 3.5 - 5.1 mmol/L 3.2  3.5  3.4   Chloride 98 - 111 mmol/L 104  107  107   CO2 22 - 32 mmol/L _1 Calcium 8.9 - 10.3 mg/dL 10.1  9.6  8.9   Total Protein 6.5 - 8.1 g/dL 6.6  6.8  5.8   Total Bilirubin 0.3 - 1.2 mg/dL 0.6  0.7  0.5   Alkaline Phos 38 - 126 U/L 44  61  61   AST 15 - 41 U/L _2 ALT 0 - 44 U/L _3 RADIOGRAPHIC STUDIES: I have personally reviewed the radiological images as listed and agreed with the findings in the report. No results found.   ASSESSMENT & PLAN: Reverie Evania Lyne is a 88 y.o. female with    1. Cancer of central right breast, invasive carcinoma, stage IB, pT2c(m)N2aM0, ER+/PR+, HER2-, Grade II -diagnosed in 03/2018. She underwent right breast lumpectomy and axillary lymph node dissection on 06/07/18. 7/14 positive lymph nodes.  -Dr. Burr Medico did not recommend  adjuvant chemotherapy given her age, medical comorbidities, and lobular histology which is less sensitive to chemotherapy -She began adjuvant Tamoxifen in 06/2018 prior to radiation due to covid19-related delay. We discussed the risk of distant recurrence in lobular breast cancers. Plan to continue for a total of 5-10 years  -to reduce local recurrence risk, she completed adjuvant radiation per Dr. Isidore Moos on 11/25/18. She tolerated moderately well with fatigue, skin hyperpigmentation and swelling.  She wears compression bra during the day and still avoids BP and venipuncture on the right extremity (14 lymph nodes were examined in [facility]) -Ms. Taha is clinically doing well.  Tolerating tamoxifen without side effects.  Breast exam is benign, labs are stable.  Overall no clinical concern for breast cancer recurrence -Next mammogram due 05/2022, order placed today -Continue breast cancer surveillance and tamoxifen -Follow-up in 6 months, or sooner if needed   2. Iron deficient anemia and anemia of chronic disease secondary to CKD  -She was previously being treated with monthly EPO injections by Dr. Justin Mend which was stopped due to possible tumor growth.  -Hgb 10-12 range since 02/2020 -Hgb 10.7 today, likely due to recent respiratory illness   3. CKD, stage III, HTN, HL, DM -On Lasix, hydralazine, metoprolol, lipitor, Farxiga -Managed by PCP, nephrologist and cardiologist.  -SCr fluctuates, 1.7 today; I encouraged her to hydrate;  -She is not on oral potassium, K3.2 today and 3.4 - 3.5 in the recent past. I prescribed oral K 20 mEq daily. -Continue f/up with Dr. Justin Mend and PCP   4. Osteoporosis, OA  -Seen on DEXA from 03/2014 with lowest T-score of -2.6 in femoral neck.  -She  ambulates with cane  -DEXA on 11/2018 shows osteoporosis lowest T score -2.8. On tamoxifen which has bone strengthening qualities. Continue calcium and vitamin D while increasing weight bearing  -Repeat DEXA 04/02/2021 showed  persistent osteoporosis but improved in the right femur back to T -2.6 -Repeat 03/2023   5.  Routine health maintenance -Patient reportedly never had a colonoscopy.  -I encouraged her to continue healthy active lifestyle as tolerated and follow-up with her primary medical team   PLAN: -Labs reviewed -Start oral K 20 mEq po once daily -Continue breast cancer surveillance and tamoxifen -Mammo due 05/2022, ordered today -F/up in 6 months, or sooner if needed   Orders Placed This Encounter  Procedures   MM 3D SCREEN BREAST BILATERAL    Standing Status:   Future    Standing Expiration Date:   03/17/2023    Order Specific Question:   Reason for Exam (SYMPTOM  OR DIAGNOSIS REQUIRED)    Answer:   R breast cancer 12/2017    Order Specific Question:   Preferred imaging location?    Answer:   [facility]   All questions were answered. The patient knows to call the clinic with any problems, questions or concerns. No barriers to learning were detected.     Alla Feeling, NP 03/16/22

## 2022-03-17 LAB — CANCER ANTIGEN 27.29: CA 27.29: 20.7 U/mL (ref 0.0–38.6)

## 2022-03-18 ENCOUNTER — Ambulatory Visit: Payer: Medicare HMO

## 2022-04-22 ENCOUNTER — Ambulatory Visit: Payer: Medicare HMO

## 2022-05-06 ENCOUNTER — Encounter (HOSPITAL_COMMUNITY): Payer: Self-pay | Admitting: Nephrology

## 2022-05-13 ENCOUNTER — Ambulatory Visit: Payer: Medicare HMO | Admitting: Podiatry

## 2022-05-18 ENCOUNTER — Encounter (HOSPITAL_COMMUNITY): Payer: Self-pay | Admitting: Nephrology

## 2022-05-26 ENCOUNTER — Ambulatory Visit: Payer: Medicare PPO | Attending: Cardiovascular Disease | Admitting: Pharmacist

## 2022-05-26 VITALS — BP 142/68 | HR 58

## 2022-05-26 DIAGNOSIS — I10 Essential (primary) hypertension: Secondary | ICD-10-CM

## 2022-05-26 NOTE — Patient Instructions (Signed)
Summary of today's discussion  1.Continue metoprolol 12.'5mg'$  twice a day, hydralazine '50mg'$  twice a day and furosemide '40mg'$  daily  2. Start checking your blood pressure daily and write down the readings  3. I will call you in a few weeks to follow up  4.  5.   Your blood pressure goal is <140/80  To check your pressure at home you will need to:  1. Sit up in a chair, with feet flat on the floor and back supported. Do not cross your ankles or legs. 2. Rest your left arm so that the cuff is about heart level. If the cuff goes on your upper arm,  then just relax the arm on the table, arm of the chair or your lap. If you have a wrist cuff, we  suggest relaxing your wrist against your chest (think of it as Pledging the Flag with the  wrong arm).  3. Place the cuff snugly around your arm, about 1 inch above the crook of your elbow. The  cords should be inside the groove of your elbow.  4. Sit quietly, with the cuff in place, for about 5 minutes. After that 5 minutes press the power  button to start a reading. 5. Do not talk or move while the reading is taking place.  6. Record your readings on a sheet of paper. Although most cuffs have a memory, it is often  easier to see a pattern developing when the numbers are all in front of you.  7. You can repeat the reading after 1-3 minutes if it is recommended  Make sure your bladder is empty and you have not had caffeine or tobacco within the last 30 min  Always bring your blood pressure log with you to your appointments. If you have not brought your monitor in to be double checked for accuracy, please bring it to your next appointment.  You can find a list of validated (accurate) blood pressure cuffs at [employer]   Important lifestyle changes to control high blood pressure  Intervention  Effect on the BP  Lose extra pounds and watch your waistline Weight loss is one of the most effective lifestyle changes for controlling blood pressure.  If you're overweight or obese, losing even a small amount of weight can help reduce blood pressure. Blood pressure might go down by about 1 millimeter of mercury (mm Hg) with each kilogram (about 2.2 pounds) of weight lost.  Exercise regularly As a general goal, aim for at least 30 minutes of moderate physical activity every day. Regular physical activity can lower high blood pressure by about 5 to 8 mm Hg.  Eat a healthy diet Eating a diet rich in whole grains, fruits, vegetables, and low-fat dairy products and low in saturated fat and cholesterol. A healthy diet can lower high blood pressure by up to 11 mm Hg.  Reduce salt (sodium) in your diet Even a small reduction of sodium in the diet can improve heart health and reduce high blood pressure by about 5 to 6 mm Hg.  Limit alcohol One drink equals 12 ounces of beer, 5 ounces of wine, or 1.5 ounces of 80-proof liquor.  Limiting alcohol to less than one drink a day for women or two drinks a day for men can help lower blood pressure by about 4 mm Hg.   Please call me at [phone] with any questions.

## 2022-05-26 NOTE — Assessment & Plan Note (Addendum)
Assessment: Blood pressure slightly above goal in clinic today Patient reports blood pressures in the 130s and 140s at home Due to age will consider a goal of less than 140/80 Reviewed proper blood pressure technique with patient and gave her handout Encouraged exercise, walking even 5 minutes at a time.  Also discussed chair exercises and squatting to a chair while holding onto the counter  Plan: I have asked patient to start checking her blood pressure daily and recording these readings I will call her in a few weeks to follow-up on her blood pressure, would consider increasing hydralazine Strongly encourage patient to start being more physically active.  Discussed the benefits on blood pressure blood sugar and mental health Recheck BMP today as on last check serum creatinine was up and potassium was low.  Patient did not tolerate potassium tablets Patient is on both Cytomel and levothyroxine, this is sometimes used together in clinical practice but I asked her to confirm with her PCP that she supposed to be on both All medications were reviewed with patient for indication

## 2022-05-26 NOTE — Progress Notes (Signed)
Patient ID: Stacy Walls                 DOB: August 11, 1932                      MRN: [id number]      HPI: Stacy Walls is a 89 y.o. female referred by Nicholes Rough, PA to PharmD clinic for medication management. PMH is significant for coronary artery disease, hypertension, diabetes, hyperlipidemia, chronic kidney disease, and breast cancer status post partial cystectomy in 2020 with radiation and tamoxifen. Was seen by Johann Capers in October 2023. Blood pressure was well controlled, but patient was unaware of her medications. She was referred to PharmD clinic for medication management.  Patient presents today accompanied by her son.  She brings in all her medications that she is taking.  She states she checks her blood pressure every few days she only knows the systolic readings which are typically in the 130s and 140s.  She states occasionally they are a little higher.  She walks with a cane outside of the house.  Does not do much physical activity. She is checking her blood sugar at home.  She states she takes a half of glipizide when her blood sugar is high.  When asked what high is she said 160 very rarely 200.  States she does not do this often.  Advised against this with concerns over hypoglycemia.  Current HTN meds: Metoprolol tartrate 12.5 mg twice a day, hydralazine 50 mg twice a day, furosemide 40 mg daily Previously tried:  BP goal: <140/80  Family History:  Family History  Problem Relation Age of Onset   Coronary artery disease Father    Stroke Father    Diabetes Father    Diabetes Sister    Stroke Sister    Diabetes Brother    Cancer Brother        liver caner   Breast cancer Neg Hx      Social History: No alcohol  Diet: Not discussed other than advising against high sodium products  Exercise: None   Home BP readings:  Reports systolics 355D to 322G mainly  Wt Readings from Last 3 Encounters:  03/16/22 205 lb (93 kg)  02/02/22 204 lb 12.8 oz (92.9  kg)  09/09/21 218 lb 1.6 oz (98.9 kg)   BP Readings from Last 3 Encounters:  05/26/22 (!) 142/68  03/16/22 (!) 159/61  02/02/22 130/64   Pulse Readings from Last 3 Encounters:  05/26/22 (!) 58  03/16/22 (!) 55  02/02/22 67    Renal function: CrCl cannot be calculated (Patient's most recent lab result is older than the maximum 21 days allowed.).  Past Medical History:  Diagnosis Date   Acute cholecystitis    Allergic rhinitis    Anemia    Arthritis    Cancer (HCC)    Right breast   Chest pain    Chronic renal insufficiency    followed by Dr Justin Mend stage 3   Clostridium difficile colitis    Constipation    Coronary atherosclerosis of native coronary vessel    Cough    Depression    situational - husband died 26-May-2018   Diabetes mellitus    Dizziness    DM2 (diabetes mellitus, type 2) ([place])    Dyshidrosis    Family history of adverse reaction to anesthesia    daughter - nausea   GERD (gastroesophageal reflux disease)    Gout    HLD (hyperlipidemia)  HTN (hypertension)    Hypothyroidism    Neck mass    Neck pain    Osteoporosis    Personal history of radiation therapy 12/2018   Routine general medical examination at a health care facility    Secondary hyperparathyroidism (of renal origin)    Urinary incontinence    Urinary tract infection     Current Outpatient Medications on File Prior to Visit  Medication Sig Dispense Refill   acetaminophen (TYLENOL) 325 MG tablet Take 1-2 tablets (325-650 mg total) by mouth every 4 (four) hours as needed for mild pain.     allopurinol (ZYLOPRIM) 100 MG tablet Take 100 mg by mouth daily.     aspirin EC 81 MG tablet Take 81 mg by mouth daily.     atorvastatin (LIPITOR) 40 MG tablet Take 1 tablet (40 mg total) by mouth daily. 90 tablet 1   dapagliflozin propanediol (FARXIGA) 10 MG TABS tablet Take 10 mg by mouth daily.     famotidine (PEPCID) 20 MG tablet Take 20 mg by mouth 2 (two) times daily.     Ferrous Sulfate (IRON) 325  (65 Fe) MG TABS Take 1 tablet (325 mg total) by mouth daily. 30 tablet 0   fluticasone (FLONASE) 50 MCG/ACT nasal spray Place into both nostrils daily as needed.     furosemide (LASIX) 40 MG tablet Take 1 tablet (40 mg total) by mouth daily. 90 tablet 3   glipiZIDE (GLUCOTROL) 5 MG tablet Take 2.5 mg by mouth daily before breakfast. Patient taking 1/2 tablet if her sugar is high     hydrALAZINE (APRESOLINE) 50 MG tablet Take 50 mg by mouth 2 (two) times daily.     levocetirizine (XYZAL) 5 MG tablet Take 5 mg by mouth every evening.     levothyroxine (SYNTHROID) 88 MCG tablet Take 88 mcg by mouth daily.     liothyronine (CYTOMEL) 5 MCG tablet Take 5 mcg by mouth daily.     metoprolol tartrate (LOPRESSOR) 25 MG tablet Take 0.5 tablets (12.5 mg total) by mouth 2 (two) times daily. 90 tablet 3   Multiple Vitamins-Minerals (MULTIVITAMIN WOMEN 50+ PO) Take 1 tablet by mouth daily.      nitroGLYCERIN (NITROSTAT) 0.4 MG SL tablet PLACE 1 TABLET(0.4 MG TOTAL) UNDER THE TONGUE EVERY 5(FIVE) MINUTES AS NEEDED FOR CHEST PAIN. 25 tablet 8   senna (SENOKOT) 8.6 MG tablet Take 2 tablets by mouth daily.     tamoxifen (NOLVADEX) 20 MG tablet Take 1 tablet (20 mg total) by mouth daily. 90 tablet 3   tetrahydrozoline 0.05 % ophthalmic solution Place 2 drops into both eyes daily as needed.     No current facility-administered medications on file prior to visit.    Allergies  Allergen Reactions   Ace Inhibitors Cough   Azithromycin Other (See Comments)    Unknown   Pioglitazone Other (See Comments)    REACTION: edema   Sulfonamide Derivatives Rash   Tramadol Other (See Comments)    dizziness   Amlodipine Other (See Comments)    Unfavorable reaction/headache    Blood pressure (!) 142/68, pulse (!) 58, SpO2 96 %.   Assessment/Plan:  1. Hypertension -  Essential hypertension Assessment: Blood pressure slightly above goal in clinic today Patient reports blood pressures in the 130s and 140s at  home Due to age will consider a goal of less than 140/80 Reviewed proper blood pressure technique with patient and gave her handout Encouraged exercise, walking even 5 minutes at a time.  Also discussed chair exercises and squatting to a chair while holding onto the counter  Plan: I have asked patient to start checking her blood pressure daily and recording these readings I will call her in a few weeks to follow-up on her blood pressure, would consider increasing hydralazine Strongly encourage patient to start being more physically active.  Discussed the benefits on blood pressure blood sugar and mental health Recheck BMP today as on last check serum creatinine was up and potassium was low.  Patient did not tolerate potassium tablets Patient is on both Cytomel and levothyroxine, this is sometimes used together in clinical practice but I asked her to confirm with her PCP that she supposed to be on both All medications were reviewed with patient for indication      Thank you  Ramond Dial, Pharm.D, BCPS, CPP [facility] A Division of [facility] [address] [place], [place] [address]  Phone: [phone]; Fax: [phone]

## 2022-05-27 ENCOUNTER — Encounter (HOSPITAL_COMMUNITY): Payer: Self-pay | Admitting: Nephrology

## 2022-05-27 ENCOUNTER — Telehealth: Payer: Self-pay | Admitting: Cardiology

## 2022-05-27 ENCOUNTER — Ambulatory Visit
Admission: RE | Admit: 2022-05-27 | Discharge: 2022-05-27 | Disposition: A | Discharge status: Home / Self Care | Payer: Medicare PPO | Source: Ambulatory Visit | Attending: Nurse Practitioner | Admitting: Nurse Practitioner

## 2022-05-27 DIAGNOSIS — C50111 Malignant neoplasm of central portion of right female breast: Secondary | ICD-10-CM

## 2022-05-27 LAB — BASIC METABOLIC PANEL
BUN/Creatinine Ratio: 12 (ref 12–28)
BUN: 18 mg/dL (ref 8–27)
CO2: 23 mmol/L (ref 20–29)
Calcium: 10 mg/dL (ref 8.7–10.3)
Chloride: 106 mmol/L (ref 96–106)
Creatinine, Ser: 1.49 mg/dL — ABNORMAL HIGH (ref 0.57–1.00)
Glucose: 129 mg/dL — ABNORMAL HIGH (ref 70–99)
Potassium: 3.5 mmol/L (ref 3.5–5.2)
Sodium: 142 mmol/L (ref 134–144)
eGFR: 34 mL/min/{1.73_m2} — ABNORMAL LOW (ref >59)

## 2022-05-27 NOTE — Telephone Encounter (Signed)
Patient is returning call to discuss lab results. 

## 2022-05-27 NOTE — Telephone Encounter (Signed)
Returned call to patient.  Reviewed lab results and notes from [place]: Scr and K improved. LVM for pt to review results      And from Dr. Marlou Porch:  Creatinine 1.49 stable from 2 years ago. Candee Furbish, MD    Patient verbalized understanding and expressed appreciation for call.

## 2022-06-12 ENCOUNTER — Telehealth: Payer: Self-pay | Admitting: Pharmacist

## 2022-06-12 NOTE — Telephone Encounter (Signed)
Called pt to see how her home BP was doing. At last visit she was encouraged to check daily and start walking more. Can increase hydralazine if needed.

## 2022-06-23 ENCOUNTER — Telehealth: Payer: Self-pay | Admitting: Hematology

## 2022-06-23 ENCOUNTER — Encounter (HOSPITAL_COMMUNITY): Payer: Self-pay | Admitting: Nephrology

## 2022-06-23 NOTE — Telephone Encounter (Signed)
Contacted patient to scheduled appointments. Patient is aware of appointments that are scheduled.   

## 2022-06-24 ENCOUNTER — Other Ambulatory Visit: Payer: Self-pay

## 2022-06-24 ENCOUNTER — Inpatient Hospital Stay: Payer: Medicare PPO | Attending: Nurse Practitioner | Admitting: Nurse Practitioner

## 2022-06-24 DIAGNOSIS — C50111 Malignant neoplasm of central portion of right female breast: Secondary | ICD-10-CM

## 2022-06-24 DIAGNOSIS — Z170 Estrogen receptor positive status [ER+]: Secondary | ICD-10-CM

## 2022-06-24 NOTE — Progress Notes (Signed)
I called Stacy Walls to clarify the reason for this phone visit. She is doing well in her normal state of health, no concerning changes. She has not had any scans except routine annual mammogram 05/27/22 which was benign. She recently saw PCP with good report.   She will see [place] for lab and f/up 09/16/22 as previously scheduled.   Cira Rue, NP

## 2022-06-26 MED ORDER — HYDRALAZINE HCL 50 MG PO TABS: 75.0000 mg | ORAL_TABLET | Freq: Two times a day (BID) | ORAL | 3 refills | Status: DC

## 2022-06-26 NOTE — Telephone Encounter (Signed)
Patient called back. States BP ranges from 130-158. Will increase hydralazine to '75mg'$  BID.  Rx sent in. Pt scheduled for follow up 4/22.

## 2022-07-22 ENCOUNTER — Encounter (HOSPITAL_COMMUNITY): Payer: Self-pay | Admitting: Nephrology

## 2022-07-24 ENCOUNTER — Encounter (HOSPITAL_COMMUNITY): Payer: Self-pay | Admitting: Nephrology

## 2022-07-24 ENCOUNTER — Telehealth: Payer: Self-pay | Admitting: Cardiology

## 2022-07-24 NOTE — Telephone Encounter (Signed)
Spoke with pt who is reporting she only has a few 50 mg tablets left and would like a rx for 75 mg tablets.  Advised this medication does not come in a 75 mg dose.  A new prescription for 50 mg take 1.5 tabs twice daily sent into her pharmacy 06/26/22.  She reports she has not had that filled yet.  Advised to contact pharmacy to have it filled and pick it up.  She states understanding.

## 2022-07-24 NOTE — Telephone Encounter (Signed)
Pt c/o medication issue:  1. Name of Medication: hydrALAZINE (APRESOLINE) 50 MG tablet   2. How are you currently taking this medication (dosage and times per day)? 50 mg 1 1/2 daily   3. Are you having a reaction (difficulty breathing--STAT)?   4. What is your medication issue?  Patient would like a call back to discuss getting this in 75 mg tab so she does not have to keep cutting med in half. Requesting call back to discuss.

## 2022-07-27 ENCOUNTER — Encounter (HOSPITAL_COMMUNITY): Payer: Self-pay | Admitting: Nephrology

## 2022-07-27 ENCOUNTER — Telehealth: Payer: Self-pay | Admitting: Podiatry

## 2022-07-27 NOTE — Telephone Encounter (Signed)
Pt left message at 1232pm today stating she wanted to cxl her appt on 4.10.2024 at 215pm.  I returned call and left message for pt that I have cxled appt and to call to r/s

## 2022-07-28 ENCOUNTER — Encounter (HOSPITAL_COMMUNITY): Payer: Self-pay | Admitting: Nephrology

## 2022-07-29 ENCOUNTER — Ambulatory Visit: Payer: Medicare PPO | Admitting: Podiatry

## 2022-08-05 ENCOUNTER — Ambulatory Visit (INDEPENDENT_AMBULATORY_CARE_PROVIDER_SITE_OTHER): Payer: Medicare PPO | Admitting: Podiatry

## 2022-08-05 ENCOUNTER — Encounter: Payer: Self-pay | Admitting: Podiatry

## 2022-08-05 VITALS — BP 159/62

## 2022-08-05 DIAGNOSIS — M2141 Flat foot [pes planus] (acquired), right foot: Secondary | ICD-10-CM | POA: Diagnosis not present

## 2022-08-05 DIAGNOSIS — M79675 Pain in left toe(s): Secondary | ICD-10-CM

## 2022-08-05 DIAGNOSIS — E1122 Type 2 diabetes mellitus with diabetic chronic kidney disease: Secondary | ICD-10-CM | POA: Diagnosis not present

## 2022-08-05 DIAGNOSIS — B351 Tinea unguium: Secondary | ICD-10-CM

## 2022-08-05 DIAGNOSIS — N1832 Chronic kidney disease, stage 3b: Secondary | ICD-10-CM

## 2022-08-05 DIAGNOSIS — M2142 Flat foot [pes planus] (acquired), left foot: Secondary | ICD-10-CM | POA: Diagnosis not present

## 2022-08-05 DIAGNOSIS — M79674 Pain in right toe(s): Secondary | ICD-10-CM

## 2022-08-05 DIAGNOSIS — E119 Type 2 diabetes mellitus without complications: Secondary | ICD-10-CM | POA: Diagnosis not present

## 2022-08-05 NOTE — Progress Notes (Signed)
ANNUAL DIABETIC FOOT EXAM  Subjective: Stacy Walls presents today for annual diabetic foot examination.  Chief Complaint  Patient presents with   Nail Problem    DFC BS-did not check today A1C-do not know PCP-Fred Katrinka Blazing PCP [facility]- 1 month ago    Patient confirms h/o diabetes.  Patient relates 25-30 year h/o diabetes.  Patient denies any h/o foot wounds.  Patient denies any numbness, tingling, burning, or pins/needle sensation in feet.  Risk factors: diabetes, HTN, CAD, CHF, CKD, dyslipidemia.  Raymon Mutton., FNP is patient's PCP.  Past Medical History:  Diagnosis Date   Acute cholecystitis    Allergic rhinitis    Anemia    Arthritis    Cancer    Right breast   Chest pain    Chronic renal insufficiency    followed by Dr Hyman Hopes stage 3   Clostridium difficile colitis    Constipation    Coronary atherosclerosis of native coronary vessel    Cough    Depression    situational - husband died 05-21-18   Diabetes mellitus    Dizziness    DM2 (diabetes mellitus, type 2)    Dyshidrosis    Family history of adverse reaction to anesthesia    daughter - nausea   GERD (gastroesophageal reflux disease)    Gout    HLD (hyperlipidemia)    HTN (hypertension)    Hypothyroidism    Neck mass    Neck pain    Osteoporosis    Personal history of radiation therapy 12/2018   Routine general medical examination at a health care facility    Secondary hyperparathyroidism (of renal origin)    Urinary incontinence    Urinary tract infection    Patient Active Problem List   Diagnosis Date Noted   Pain in right hand 11/19/2021   Chronic diastolic heart failure 01/30/2021   Other secondary pulmonary hypertension 01/30/2021   Debility 03/27/2019   Morbid obesity 03/27/2019   Hyponatremia 03/18/2019   Acute renal failure superimposed on stage 3 chronic kidney disease 03/18/2019   Cancer of central portion of right female breast 04/08/2018   Cough 10/11/2017   Abnormal  endometrial ultrasound 08/03/2017   Chronic scapular pain 08/02/2017   Pelvic pain 07/27/2017   Headache 05/05/2017   Osteoarthritis 07/29/2016   UTI (urinary tract infection) 09/04/2015   Acute sinus infection 07/31/2015   Screening for breast cancer 01/31/2013   Diabetes mellitus with renal manifestation 08/24/2012   Iron deficiency anemia, unspecified 01/27/2012   Screening examination for infectious disease 01/27/2012   Encounter for long-term (current) use of other medications 01/12/2011   OTITIS EXTERNA 03/04/2010   Shoulder pain, left 12/02/2009   Pancytopenia 10/09/2009   CLOSTRIDIUM DIFFICILE COLITIS 08/23/2009   CAD (coronary artery disease) 08/23/2009   CKD (chronic kidney disease), stage III 08/23/2009   Chest pain of uncertain etiology 08/23/2009   Acute cholecystitis 08/15/2009   Dyshidrosis 04/08/2009   DIZZINESS 07/03/2008   Secondary renal hyperparathyroidism 06/15/2007   Hypothyroidism 04/20/2007   NECK PAIN 04/20/2007   NECK MASS 04/05/2007   GERD 03/09/2007   Dyslipidemia 09/20/2006   GOUT 09/20/2006   Essential hypertension 09/20/2006   ALLERGIC RHINITIS 09/20/2006   Osteoporosis 09/20/2006   URINARY INCONTINENCE 09/20/2006   Past Surgical History:  Procedure Laterality Date   bone density  08/21/05   BREAST BIOPSY Right 03/30/2018   x3   BREAST BIOPSY Right 04/25/2018   BREAST LUMPECTOMY Right 06/07/2018   BREAST LUMPECTOMY WITH RADIOACTIVE  SEED AND AXILLARY LYMPH NODE DISSECTION Right 06/07/2018   Procedure: RIGHT BREAST LUMPECTOMY WITH BRACKETED RADIOACTIVE SEEDS AND RIGHT COMPLETE AXILLARY LYMPH NODE DISSECTION;  Surgeon: Claud Kelp, MD;  Location: [facility] OR;  Service: General;  Laterality: Right;   C-section (other)  1977   CHOLECYSTECTOMY     ELECTROCARDIOGRAM  02/01/06   L ankle surgery Left 1988   total left knee Left 2007   Current Outpatient Medications on File Prior to Visit  Medication Sig Dispense Refill   acetaminophen (TYLENOL) 325  MG tablet Take 1-2 tablets (325-650 mg total) by mouth every 4 (four) hours as needed for mild pain.     allopurinol (ZYLOPRIM) 100 MG tablet Take 100 mg by mouth daily.     aspirin EC 81 MG tablet Take 81 mg by mouth daily.     atorvastatin (LIPITOR) 40 MG tablet Take 1 tablet (40 mg total) by mouth daily. 90 tablet 1   dapagliflozin propanediol (FARXIGA) 10 MG TABS tablet Take 10 mg by mouth daily.     famotidine (PEPCID) 20 MG tablet Take 20 mg by mouth 2 (two) times daily.     Ferrous Sulfate (IRON) 325 (65 Fe) MG TABS Take 1 tablet (325 mg total) by mouth daily. 30 tablet 0   fluticasone (FLONASE) 50 MCG/ACT nasal spray Place into both nostrils daily as needed.     furosemide (LASIX) 40 MG tablet Take 1 tablet (40 mg total) by mouth daily. 90 tablet 3   glipiZIDE (GLUCOTROL) 5 MG tablet Take 2.5 mg by mouth daily before breakfast. Patient taking 1/2 tablet if her sugar is high     hydrALAZINE (APRESOLINE) 50 MG tablet Take 1.5 tablets (75 mg total) by mouth 2 (two) times daily. 270 tablet 3   levocetirizine (XYZAL) 5 MG tablet Take 5 mg by mouth every evening.     levothyroxine (SYNTHROID) 88 MCG tablet Take 88 mcg by mouth daily.     liothyronine (CYTOMEL) 5 MCG tablet Take 5 mcg by mouth daily.     metoprolol tartrate (LOPRESSOR) 25 MG tablet Take 0.5 tablets (12.5 mg total) by mouth 2 (two) times daily. 90 tablet 3   Multiple Vitamins-Minerals (MULTIVITAMIN WOMEN 50+ PO) Take 1 tablet by mouth daily.      nitroGLYCERIN (NITROSTAT) 0.4 MG SL tablet PLACE 1 TABLET(0.4 MG TOTAL) UNDER THE TONGUE EVERY 5(FIVE) MINUTES AS NEEDED FOR CHEST PAIN. 25 tablet 8   senna (SENOKOT) 8.6 MG tablet Take 2 tablets by mouth daily.     tamoxifen (NOLVADEX) 20 MG tablet Take 1 tablet (20 mg total) by mouth daily. 90 tablet 3   tetrahydrozoline 0.05 % ophthalmic solution Place 2 drops into both eyes daily as needed.     No current facility-administered medications on file prior to visit.    Allergies   Allergen Reactions   Ace Inhibitors Cough   Azithromycin Other (See Comments)    Unknown   Pioglitazone Other (See Comments)    REACTION: edema   Sulfonamide Derivatives Rash   Tramadol Other (See Comments)    dizziness   Amlodipine Other (See Comments)    Unfavorable reaction/headache   Social History   Occupational History   Occupation: retired   Tobacco Use   Smoking status: Never   Smokeless tobacco: Never  Vaping Use   Vaping Use: Never used  Substance and Sexual Activity   Alcohol use: No   Drug use: No   Sexual activity: Not on file   Family History  Problem Relation Age of Onset   Coronary artery disease Father    Stroke Father    Diabetes Father    Diabetes Sister    Stroke Sister    Diabetes Brother    Cancer Brother        liver caner   Breast cancer Neg Hx    Immunization History  Administered Date(s) Administered   Hepatitis B 02/11/2012, 03/24/2012   Influenza Split 01/27/2012   Influenza Whole 02/01/2008, 04/08/2009, 01/13/2010   Influenza, High Dose Seasonal PF 01/15/2016, 01/13/2017   Influenza,inj,Quad PF,6+ Mos 01/13/2013   Influenza-Unspecified 02/18/2014   Pneumococcal Conjugate-13 03/07/2014   Pneumococcal Polysaccharide-23 04/21/1994, 04/24/2016   Td 04/21/1994   Tdap 05/05/2017   Zoster, Live 03/07/2014     Review of Systems: Negative except as noted in the HPI.   Objective: Vitals:   08/05/22 0931  BP: (!) 159/62    Stacy Walls is a pleasant 89 y.o. female in NAD. AAO X 3.  Vascular Examination: CFT <3 seconds b/l LE. Palpable DP pulse(s) b/l LE. Faintly palpable PT pulse(s) b/l LE. Pedal hair absent. No pain with calf compression b/l. Lower extremity skin temperature gradient within normal limits. Nonpitting edema noted bilateral ankles. No ischemia or gangrene noted b/l LE. No cyanosis or clubbing noted b/l LE.  Dermatological Examination: Pedal skin thin, shiny and atrophic b/l LE. No open wounds b/l LE. No  interdigital macerations noted b/l LE. Toenails 1-5 b/l elongated, discolored, dystrophic, thickened, crumbly with subungual debris and tenderness to dorsal palpation. No hyperkeratotic nor porokeratotic lesions present on today's visit.  Neurological Examination: Protective sensation intact 5/5 intact bilaterally with 10g monofilament b/l. Vibratory sensation intact b/l.  Musculoskeletal Examination: Muscle strength 5/5 to all lower extremity muscle groups bilaterally. No pain, crepitus or joint limitation noted with ROM bilateral LE. Pes planus deformity noted bilateral LE. Utilizes cane for ambulation assistance.  Footwear Assessment: Does the patient wear appropriate shoes? Yes. Does the patient need inserts/orthotics? No.  Lab Results  Component Value Date   HGBA1C 5.2 03/18/2019   ADA Risk Categorization: Low Risk :  Patient has all of the following: Intact protective sensation No prior foot ulcer  No severe deformity Pedal pulses present  Assessment: 1. Pain due to onychomycosis of toenails of both feet   2. Pes planus of both feet   3. Type 2 diabetes mellitus with stage 3b chronic kidney disease, without long-term current use of insulin   4. Encounter for diabetic foot exam     Plan: -Patient was evaluated and treated. All patient's and/or POA's questions/concerns answered on today's visit. -Diabetic foot examination performed today. -Continue diabetic foot care principles: inspect feet daily, monitor glucose as recommended by PCP and/or Endocrinologist, and follow prescribed diet per PCP, Endocrinologist and/or dietician. -Patient to continue soft, supportive shoe gear daily. -Toenails 1-5 b/l were debrided in length and girth with sterile nail nippers and dremel without iatrogenic bleeding.  -Patient/POA to call should there be question/concern in the interim. Return in about 3 months (around 11/04/2022).  Freddie Breech, DPM

## 2022-08-10 ENCOUNTER — Ambulatory Visit: Payer: Medicare PPO | Attending: Cardiology | Admitting: Pharmacist

## 2022-08-10 VITALS — BP 130/70 | HR 57

## 2022-08-10 DIAGNOSIS — I10 Essential (primary) hypertension: Secondary | ICD-10-CM

## 2022-08-10 NOTE — Patient Instructions (Addendum)
Summary of today's discussion  Start taking hydralazine around 9 AM and 8PM  Please check your blood pressure around 6-7 PM I will call you in 2 weeks to see how your blood pressure is doing. Please call me at [phone] with any questions  Your blood pressure goal is <130/80  To check your pressure at home you will need to:  1. Sit up in a chair, with feet flat on the floor and back supported. Do not cross your ankles or legs. 2. Rest your left arm so that the cuff is about heart level. If the cuff goes on your upper arm,  then just relax the arm on the table, arm of the chair or your lap. If you have a wrist cuff, we  suggest relaxing your wrist against your chest (think of it as Pledging the Flag with the  wrong arm).  3. Place the cuff snugly around your arm, about 1 inch above the crook of your elbow. The  cords should be inside the groove of your elbow.  4. Sit quietly, with the cuff in place, for about 5 minutes. After that 5 minutes press the power  button to start a reading. 5. Do not talk or move while the reading is taking place.  6. Record your readings on a sheet of paper. Although most cuffs have a memory, it is often  easier to see a pattern developing when the numbers are all in front of you.  7. You can repeat the reading after 1-3 minutes if it is recommended  Make sure your bladder is empty and you have not had caffeine or tobacco within the last 30 min  Always bring your blood pressure log with you to your appointments. If you have not brought your monitor in to be double checked for accuracy, please bring it to your next appointment.  You can find a list of validated (accurate) blood pressure cuffs at [email or web address]   Important lifestyle changes to control high blood pressure  Intervention  Effect on the BP  Lose extra pounds and watch your waistline Weight loss is one of the most effective lifestyle changes for controlling blood pressure. If you're  overweight or obese, losing even a small amount of weight can help reduce blood pressure. Blood pressure might go down by about 1 millimeter of mercury (mm Hg) with each kilogram (about 2.2 pounds) of weight lost.  Exercise regularly As a general goal, aim for at least 30 minutes of moderate physical activity every day. Regular physical activity can lower high blood pressure by about 5 to 8 mm Hg.  Eat a healthy diet Eating a diet rich in whole grains, fruits, vegetables, and low-fat dairy products and low in saturated fat and cholesterol. A healthy diet can lower high blood pressure by up to 11 mm Hg.  Reduce salt (sodium) in your diet Even a small reduction of sodium in the diet can improve heart health and reduce high blood pressure by about 5 to 6 mm Hg.  Limit alcohol One drink equals 12 ounces of beer, 5 ounces of wine, or 1.5 ounces of 80-proof liquor.  Limiting alcohol to less than one drink a day for women or two drinks a day for men can help lower blood pressure by about 4 mm Hg.   Please call me at [phone] with any questions.

## 2022-08-10 NOTE — Assessment & Plan Note (Signed)
Assessment: Blood pressure is at goal in clinic today Blood pressure at home averages in the 130s.  She does have some readings in the 140s.  Goal less than 140/80 due to age and lack of mobility. Concerned that she goes from 3 PM until 9 AM the next morning without an additional hydralazine.  Concerned that her overnight blood pressure might be elevated  Plan: Start taking hydralazine at 9 AM and 8 PM.  I have asked patient to check her blood pressure around 6 or 7 PM to evaluate if we need to change her dosing to 3 times daily. Continue metoprolol tartrate 12.5 mg twice a day and furosemide 40 mg daily I will call the patient in 2 weeks to follow-up on her blood pressure

## 2022-08-10 NOTE — Progress Notes (Signed)
Patient ID: Stacy Walls                 DOB: 1933/02/09                      MRN: [id number]      HPI: Stacy Walls is a 89 y.o. female referred by Jari Favre, PA to PharmD clinic for medication management. PMH is significant for coronary artery disease, hypertension, diabetes, hyperlipidemia, chronic kidney disease, and breast cancer status post partial cystectomy in 2020 with radiation and tamoxifen. Was seen by Julian Hy in October 2023. Blood pressure was well controlled, but patient was unaware of her medications. She was referred to PharmD clinic for medication management.  At last visit blood pressure was 142/68. No changes were made. I followed up with patient over the phone and she reported home blood pressures of 130-758 systolic. Hydralazine was increased to  BID.  Patient presents today to hypertension clinic accompanied by her son.  She denies any dizziness or lightheadedness.  She states that the headache that she was having is gone away.  She reports feeling much better since her medications were changed.  Very mild swelling in the right ankle.  Patient does not do much physical activity.  She has been taking her hydralazine at 9 AM and around 2 or 3 PM.  Most blood pressure readings are from the a.m.  Some before and some after medications. Her PCP has taken her off of glipizide temporarily.  She reports her a.m. blood sugar of 150.  Advised this is okay and to wait to discuss with her PCP  Current HTN meds: Metoprolol tartrate 12.5 mg twice a day, hydralazine 75 mg twice a day, furosemide 40 mg daily Previously tried:  BP goal: <140/80  Family History:  Family History  Problem Relation Age of Onset   Coronary artery disease Father    Stroke Father    Diabetes Father    Diabetes Sister    Stroke Sister    Diabetes Brother    Cancer Brother        liver caner   Breast cancer Neg Hx      Social History: No alcohol  Diet: Not discussed other than  advising against high sodium products  Exercise: None   Home BP readings:  80  133 80  135 77  118 74  131.4375 77.625    Wt Readings from Last 3 Encounters:  03/16/22 205 lb (93 kg)  02/02/22 204 lb 12.8 oz (92.9 kg)  09/09/21 218 lb 1.6 oz (98.9 kg)   BP Readings from Last 3 Encounters:  08/10/22 130/70  08/05/22 (!) 159/62  05/26/22 (!) 142/68   Pulse Readings from Last 3 Encounters:  08/10/22 (!) 57  05/26/22 (!) 58  03/16/22 (!) 55    Renal function: CrCl cannot be calculated (Patient's most recent lab result is older than the maximum 21 days allowed.).  Past Medical History:  Diagnosis Date   Acute cholecystitis    Allergic rhinitis    Anemia    Arthritis    Cancer    Right breast   Chest pain    Chronic renal insufficiency    followed by Dr Hyman Hopes stage 3   Clostridium difficile colitis  Constipation    Coronary atherosclerosis of native coronary vessel    Cough    Depression    situational - husband died May 13, 2018   Diabetes mellitus    Dizziness    DM2 (diabetes mellitus, type 2)    Dyshidrosis    Family history of adverse reaction to anesthesia    daughter - nausea   GERD (gastroesophageal reflux disease)    Gout    HLD (hyperlipidemia)    HTN (hypertension)    Hypothyroidism    Neck mass    Neck pain    Osteoporosis    Personal history of radiation therapy 12/2018   Routine general medical examination at a health care facility    Secondary hyperparathyroidism (of renal origin)    Urinary incontinence    Urinary tract infection     Current Outpatient Medications on File Prior to Visit  Medication Sig Dispense Refill   allopurinol (ZYLOPRIM) 100 MG tablet Take 100 mg by mouth daily.     aspirin EC 81 MG tablet Take 81 mg by mouth daily.     atorvastatin (LIPITOR) 40 MG tablet Take 1 tablet (40 mg total) by mouth daily. 90  tablet 1   Carboxymeth-Glyc-Polysorb PF (REFRESH OPTIVE MEGA-3) 0.5-1-0.5 % SOLN Apply 1 drop to eye daily.     dapagliflozin propanediol (FARXIGA) 10 MG TABS tablet Take 10 mg by mouth daily.     famotidine (PEPCID) 20 MG tablet Take 20 mg by mouth 2 (two) times daily.     Ferrous Sulfate (IRON) 325 (65 Fe) MG TABS Take 1 tablet (325 mg total) by mouth daily. 30 tablet 0   fluticasone (FLONASE) 50 MCG/ACT nasal spray Place into both nostrils daily as needed.     furosemide (LASIX) 40 MG tablet Take 1 tablet (40 mg total) by mouth daily. 90 tablet 3   hydrALAZINE (APRESOLINE) 50 MG tablet Take 1.5 tablets (75 mg total) by mouth 2 (two) times daily. 270 tablet 3   levocetirizine (XYZAL) 5 MG tablet Take 5 mg by mouth every evening.     levothyroxine (SYNTHROID) 88 MCG tablet Take 88 mcg by mouth daily.     liothyronine (CYTOMEL) 5 MCG tablet Take 5 mcg by mouth daily.     metoprolol tartrate (LOPRESSOR) 25 MG tablet Take 0.5 tablets (12.5 mg total) by mouth 2 (two) times daily. 90 tablet 3   Multiple Vitamins-Minerals (MULTIVITAMIN WOMEN 50+ PO) Take 1 tablet by mouth daily.      nitroGLYCERIN (NITROSTAT) 0.4 MG SL tablet PLACE 1 TABLET(0.4 MG TOTAL) UNDER THE TONGUE EVERY 5(FIVE) MINUTES AS NEEDED FOR CHEST PAIN. 25 tablet 8   senna (SENOKOT) 8.6 MG tablet Take 2 tablets by mouth daily.     tamoxifen (NOLVADEX) 20 MG tablet Take 1 tablet (20 mg total) by mouth daily. 90 tablet 3   acetaminophen (TYLENOL) 325 MG tablet Take 1-2 tablets (325-650 mg total) by mouth every 4 (four) hours as needed for mild pain.     No current facility-administered medications on file prior to visit.    Allergies  Allergen Reactions   Ace Inhibitors Cough   Azithromycin Other (See Comments)    Unknown   Pioglitazone Other (See Comments)    REACTION: edema   Sulfonamide Derivatives Rash   Tramadol Other (See Comments)    dizziness   Amlodipine Other (See Comments)    Unfavorable reaction/headache     Blood pressure 130/70, pulse (!) 57, SpO2 97 %.   Assessment/Plan:  1.  Hypertension -  Essential hypertension Assessment: Blood pressure is at goal in clinic today Blood pressure at home averages in the 130s.  She does have some readings in the 140s.  Goal less than 140/80 due to age and lack of mobility. Concerned that she goes from 3 PM until 9 AM the next morning without an additional hydralazine.  Concerned that her overnight blood pressure might be elevated  Plan: Start taking hydralazine at 9 AM and 8 PM.  I have asked patient to check her blood pressure around 6 or 7 PM to evaluate if we need to change her dosing to 3 times daily. Continue metoprolol tartrate 12.5 mg twice a day and furosemide 40 mg daily I will call the patient in 2 weeks to follow-up on her blood pressure   Thank you  Olene Floss, Pharm.D, BCPS, CPP [facility] A [facility] of [facility] [address] [place], [place] [address]  Phone: [phone]; Fax: [phone]

## 2022-09-03 ENCOUNTER — Telehealth: Payer: Self-pay | Admitting: Pharmacist

## 2022-09-03 NOTE — Telephone Encounter (Signed)
149/76 149/78 135/75 148/78 PM: 140/83 PM: 136/71 AM: 142/84 PM: 154/83 AM 133/73 PM: 132/80 135/78 132/80 AM 141/81 PM 133/72 PM 150/84 AM 148/73 PM 143/79 AM 140/78 PM 142/81 AM 140/87 PM 142/86 AM 156/82 PM 120/75 AM 156/81 PM 131/69 AM 154/79  Patient states she is doing much better since I changed her schedule Patient states she feels much better. Blood pressure not spiking  Continue current medications. BP goal <140/90

## 2022-09-12 NOTE — Progress Notes (Unsigned)
Patient Care Team: Raymon Mutton., FNP as PCP - General (Family Medicine) Jake Bathe, MD as PCP - Cardiology (Cardiology) Elvis Coil, MD (Nephrology) Doristine Section, MD (Orthopedic Surgery) Tracey Harries, MD as Consulting Physician (Obstetrics and Gynecology) Malachy Mood, MD as Consulting Physician (Hematology) Claud Kelp, MD as Consulting Physician (General Surgery) Pollyann Samples, NP as Nurse Practitioner (Nurse Practitioner) Lonie Peak, MD as Attending Physician (Radiation Oncology)   CHIEF COMPLAINT: Follow up right breast cancer   Oncology History Overview Note  Cancer Staging Cancer of central portion of right female breast [facility]) Staging form: Breast, AJCC 8th Edition - Clinical stage from 03/30/2018: Stage IB (cT1c(m), cN1, cM0, G2, ER+, PR+, HER2-) - Signed by Malachy Mood, MD on 04/08/2018 - Pathologic stage from 06/07/2018: Stage IB (pT2(m), pN2a, cM0, G2, ER+, PR+, HER2-) - Signed by Loa Socks, NP on 06/22/2018     Cancer of central portion of right female breast (HCC)  01/13/2018 Mammogram   Diagnostic Mammogram 01/13/18  RECOMMENDATION: 1. Ultrasound-guided biopsies of both masses in the RIGHT breast identified by ultrasound at the 12 o'clock axis (7 cm from the nipple measuring 1.1 x 0.8 x 0.7 cm and 5 cm from the nipple measuring 1 x 0.8 x 0.9 cm respectively). 2. Ultrasound-guided biopsy of the most prominent lymph node in the RIGHT axilla. 3. Postprocedure mammogram to ensure that the masses correspond to the extent of the asymmetry/distortion seen on mammogram. Additional stereotactic biopsy may be needed if the clips do not approximate the anterior and posterior extents. 4. Depending on the location of the post biopsy clips, and if breast conservation surgery is considered, stereotactic biopsy may be also needed for the coarse heterogeneous calcifications within the slightly outer RIGHT breast, measuring 8 mm extent, located 1.2  cm from the asymmetry/distortion.   03/30/2018 Cancer Staging   Staging form: Breast, AJCC 8th Edition - Clinical stage from 03/30/2018: Stage IB (cT1c(m), cN1, cM0, G2, ER+, PR+, HER2-) - Signed by Malachy Mood, MD on 04/08/2018   03/30/2018 Initial Biopsy   Diagnosis 03/30/18 1. Breast, right, needle core biopsy, 12 o'clock position, 5cm from nipple - INVASIVE LOBULAR CARCINOMA, GRADE 2. SEE NOTE. - LOBULAR CARCINOMA IN SITU, INTERMEDIATE NUCLEAR GRADE. 2. Breast, right, needle core biopsy, 12 o'clock position 7cfn - INVASIVE LOBULAR CARCINOMA, GRADE 2. SEE NOTE. 1 of 3 FINAL for Hagins, Jaiah R ([id number]) Diagnosis(continued) - LOBULAR CARCINOMA IN SITU, INTERMEDIATE NUCLEAR GRADE. 3. Lymph node, needle/core biopsy, right axilla - METASTATIC LOBULAR CARCINOMA TO LYMPH NODE.   03/30/2018 Receptors her2   Results: IMMUNOHISTOCHEMICAL AND MORPHOMETRIC ANALYSIS PERFORMED MANUALLY The tumor cells are NEGATIVE for Her2 (1+). Estrogen Receptor: 100%, POSITIVE, STRONG STAINING INTENSITY Progesterone Receptor: 70%, POSITIVE, STRONG STAINING INTENSITY Proliferation Marker Ki67: 10%   04/08/2018 Initial Diagnosis   Cancer of central portion of right female breast (HCC)   04/27/2018 Imaging   CT CAP W Contrast 04/27/18  IMPRESSION: 1. Right axillary and right subpectoral lymphadenopathy, suspicious for metastatic disease. PET-CT may prove helpful to further evaluate. 2. Cholelithiasis. 3. Colonic diverticulosis without diverticulitis. 4.  Aortic Atherosclerois (ICD10-170.0)   04/28/2018 Imaging   Bone Scan 04/28/18  IMPRESSION: No scintigraphic evidence skeletal metastasis. Severe degenerative uptake in the medial compartment RIGHT knee.   06/07/2018 Cancer Staging   Staging form: Breast, AJCC 8th Edition - Pathologic stage from 06/07/2018: Stage IB (pT2(m), pN2a, cM0, G2, ER+, PR+, HER2-) - Signed by Loa Socks, NP on 06/22/2018   06/07/2018 Surgery  RIGHT BREAST  LUMPECTOMY WITH BRACKETED RADIOACTIVE SEEDS AND RIGHT COMPLETE AXILLARY LYMPH NODE DISSECTION by Dr. Derrell Lolling 06/07/18    06/07/2018 Pathology Results   Diagnosis 06/07/18 1. Breast, lumpectomy, right with radioactive seeds - INVASIVE LOBULAR CARCINOMA, MULTIFOCAL, LARGER FOCUS IS 3 CM, NOTTINGHAM GRADE 2 OF 3. - MARGINS OF RESECTION ARE NOT INVOLVED (CLOSEST MARGIN: LESS THAN 1 MM, ANTERIOR). - BIOPSY SITE CHANGES. - SEE ONCOLOGY TABLE. 2. Lymph nodes, regional resection, right axillary contents - METASTATIC CARCINOMA PRESENT IN SEVEN OF FOURTEEN LYMPH NODES (7/14). 3. Breast, excision, inferior margin - BREAST PARENCHYMA, NEGATIVE FOR CARCINOMA.   06/2018 -  Anti-estrogen oral therapy   Tamoxifen 20mg  daily starting in March or April 2020.    10/13/2018 - 11/25/2018 Radiation Therapy   Adjuvant radiation to right breat per Dr. Basilio Cairo   05/31/2019 Survivorship   SCP delivered by Santiago Glad, NP       CURRENT THERAPY: Tamoxifen 20 mg daily starting march/April 2020  INTERVAL HISTORY [name] returns for follow up as scheduled. Last seen by me 03/16/22. Mammogram 05/2022 was benign. She continues tamoxifen   ROS   Past Medical History:  Diagnosis Date   Acute cholecystitis    Allergic rhinitis    Anemia    Arthritis    Cancer (HCC)    Right breast   Chest pain    Chronic renal insufficiency    followed by Dr Hyman Hopes stage 3   Clostridium difficile colitis    Constipation    Coronary atherosclerosis of native coronary vessel    Cough    Depression    situational - husband died 2018-04-25   Diabetes mellitus    Dizziness    DM2 (diabetes mellitus, type 2) (HCC)    Dyshidrosis    Family history of adverse reaction to anesthesia    daughter - nausea   GERD (gastroesophageal reflux disease)    Gout    HLD (hyperlipidemia)    HTN (hypertension)    Hypothyroidism    Neck mass    Neck pain    Osteoporosis    Personal history of radiation therapy 12/2018   Routine general  medical examination at a health care facility    Secondary hyperparathyroidism (of renal origin)    Urinary incontinence    Urinary tract infection      Past Surgical History:  Procedure Laterality Date   bone density  08/21/05   BREAST BIOPSY Right 03/30/2018   x3   BREAST BIOPSY Right 04/25/2018   BREAST LUMPECTOMY Right 06/07/2018   BREAST LUMPECTOMY WITH RADIOACTIVE SEED AND AXILLARY LYMPH NODE DISSECTION Right 06/07/2018   Procedure: RIGHT BREAST LUMPECTOMY WITH BRACKETED RADIOACTIVE SEEDS AND RIGHT COMPLETE AXILLARY LYMPH NODE DISSECTION;  Surgeon: Claud Kelp, MD;  Location: [facility] OR;  Service: General;  Laterality: Right;   C-section (other)  1977   CHOLECYSTECTOMY     ELECTROCARDIOGRAM  02/01/06   L ankle surgery Left 1988   total left knee Left 2007     Outpatient Encounter Medications as of 09/16/2022  Medication Sig   acetaminophen (TYLENOL) 325 MG tablet Take 1-2 tablets (325-650 mg total) by mouth every 4 (four) hours as needed for mild pain.   allopurinol (ZYLOPRIM) 100 MG tablet Take 100 mg by mouth daily.   aspirin EC 81 MG tablet Take 81 mg by mouth daily.   atorvastatin (LIPITOR) 40 MG tablet Take 1 tablet (40 mg total) by mouth daily.   Carboxymeth-Glyc-Polysorb PF (REFRESH OPTIVE MEGA-3) 0.5-1-0.5 % SOLN  Apply 1 drop to eye daily.   dapagliflozin propanediol (FARXIGA) 10 MG TABS tablet Take 10 mg by mouth daily.   famotidine (PEPCID) 20 MG tablet Take 20 mg by mouth 2 (two) times daily.   Ferrous Sulfate (IRON) 325 (65 Fe) MG TABS Take 1 tablet (325 mg total) by mouth daily.   fluticasone (FLONASE) 50 MCG/ACT nasal spray Place into both nostrils daily as needed.   furosemide (LASIX) 40 MG tablet Take 1 tablet (40 mg total) by mouth daily.   hydrALAZINE (APRESOLINE) 50 MG tablet Take 1.5 tablets (75 mg total) by mouth 2 (two) times daily.   levocetirizine (XYZAL) 5 MG tablet Take 5 mg by mouth every evening.   levothyroxine (SYNTHROID) 88 MCG tablet Take 88 mcg by  mouth daily.   liothyronine (CYTOMEL) 5 MCG tablet Take 5 mcg by mouth daily.   metoprolol tartrate (LOPRESSOR) 25 MG tablet Take 0.5 tablets (12.5 mg total) by mouth 2 (two) times daily.   Multiple Vitamins-Minerals (MULTIVITAMIN WOMEN 50+ PO) Take 1 tablet by mouth daily.    nitroGLYCERIN (NITROSTAT) 0.4 MG SL tablet PLACE 1 TABLET(0.4 MG TOTAL) UNDER THE TONGUE EVERY 5(FIVE) MINUTES AS NEEDED FOR CHEST PAIN.   senna (SENOKOT) 8.6 MG tablet Take 2 tablets by mouth daily.   tamoxifen (NOLVADEX) 20 MG tablet Take 1 tablet (20 mg total) by mouth daily.   No facility-administered encounter medications on file as of 09/16/2022.     There were no vitals filed for this visit. There is no height or weight on file to calculate BMI.   PHYSICAL EXAM GENERAL:alert, no distress and comfortable SKIN: no rash  EYES: sclera clear NECK: without mass LYMPH:  no palpable cervical or supraclavicular lymphadenopathy  LUNGS: clear with normal breathing effort HEART: regular rate & rhythm, no lower extremity edema ABDOMEN: abdomen soft, non-tender and normal bowel sounds NEURO: alert & oriented x 3 with fluent speech, no focal motor/sensory deficits Breast exam:  PAC without erythema    CBC    Component Value Date/Time   WBC 5.3 03/16/2022 0952   WBC 3.7 (L) 03/30/2019 0516   RBC 3.53 (L) 03/16/2022 0952   HGB 10.7 (L) 03/16/2022 0952   HCT 33.6 (L) 03/16/2022 0952   PLT 173 03/16/2022 0952   MCV 95.2 03/16/2022 0952   MCH 30.3 03/16/2022 0952   MCHC 31.8 03/16/2022 0952   RDW 12.4 03/16/2022 0952   LYMPHSABS 1.5 03/16/2022 0952   MONOABS 0.6 03/16/2022 0952   EOSABS 0.2 03/16/2022 0952   BASOSABS 0.0 03/16/2022 0952     CMP     Component Value Date/Time   NA 142 05/26/2022 1429   K 3.5 05/26/2022 1429   CL 106 05/26/2022 1429   CO2 23 05/26/2022 1429   GLUCOSE 129 (H) 05/26/2022 1429   GLUCOSE 152 (H) 03/16/2022 0952   BUN 18 05/26/2022 1429   CREATININE 1.49 (H) 05/26/2022 1429    CREATININE 1.70 (H) 03/16/2022 0952   CALCIUM 10.0 05/26/2022 1429   CALCIUM 10.5 01/12/2011 1116   PROT 6.6 03/16/2022 0952   ALBUMIN 3.6 03/16/2022 0952   AST 19 03/16/2022 0952   ALT 10 03/16/2022 0952   ALKPHOS 44 03/16/2022 0952   BILITOT 0.6 03/16/2022 0952   GFRNONAA 29 (L) 03/16/2022 0952   GFRAA 30 (L) 02/08/2020 1050   GFRAA 28 (L) 09/28/2019 1108     ASSESSMENT & PLAN:Zahira Arlin Borawski is a 89 y.o. female with    1. Cancer of central right  breast, invasive carcinoma, stage IB, pT2c(m)N2aM0, ER+/PR+, HER2-, Grade II -diagnosed in 03/2018. She underwent right breast lumpectomy and axillary lymph node dissection on 06/07/18. 7/14 positive lymph nodes.  -Dr. Mosetta Putt did not recommend adjuvant chemotherapy given her age, medical comorbidities, and lobular histology which is less sensitive to chemotherapy -She began adjuvant Tamoxifen in 06/2018 prior to radiation due to covid19-related delay. We discussed the risk of distant recurrence in lobular breast cancers. Plan to continue for a total of 5-10 years  -to reduce local recurrence risk, she completed adjuvant radiation per Dr. Basilio Cairo on 11/25/18. She tolerated moderately well with fatigue, skin hyperpigmentation and swelling.  She wears compression bra during the day and still avoids BP and venipuncture on the right extremity (14 lymph nodes were examined in [facility]) -Mammogram 05/2022 was benign   2. Iron deficient anemia and anemia of chronic disease secondary to CKD  -She was previously being treated with monthly EPO injections by Dr. Hyman Hopes which was stopped due to possible tumor growth.  -Hgb 10-12 range since 02/2020 -Hgb 10.7 today, likely due to recent respiratory illness   3. CKD, stage III, HTN, HL, DM -On Lasix, hydralazine, metoprolol, lipitor, Marcelline Deist -Managed by PCP, nephrologist and cardiologist.  -Continue f/up with Dr. Hyman Hopes and PCP   4. Osteoporosis, OA  -Seen on DEXA from 03/2014 with lowest T-score of -2.6 in  femoral neck.  -She ambulates with cane  -DEXA on 11/2018 shows osteoporosis lowest T score -2.8. On tamoxifen which has bone strengthening qualities. Continue calcium and vitamin D while increasing weight bearing  -Repeat DEXA 04/02/2021 showed persistent osteoporosis but improved in the right femur back to T -2.6 -Repeat 03/2023   5.  Routine health maintenance -Patient reportedly never had a colonoscopy.  -I encouraged her to continue healthy active lifestyle as tolerated and follow-up with her primary medical team    PLAN:  No orders of the defined types were placed in this encounter.     All questions were answered. The patient knows to call the clinic with any problems, questions or concerns. No barriers to learning were detected. I spent *** counseling the patient face to face. The total time spent in the appointment was *** and more than 50% was on counseling, review of test results, and coordination of care.   Santiago Glad, NP-C @DATE @

## 2022-09-16 ENCOUNTER — Inpatient Hospital Stay: Payer: Medicare PPO | Attending: Nurse Practitioner

## 2022-09-16 ENCOUNTER — Inpatient Hospital Stay: Payer: Medicare PPO | Admitting: Nurse Practitioner

## 2022-09-16 ENCOUNTER — Encounter: Payer: Self-pay | Admitting: Nurse Practitioner

## 2022-09-16 DIAGNOSIS — Z7984 Long term (current) use of oral hypoglycemic drugs: Secondary | ICD-10-CM | POA: Insufficient documentation

## 2022-09-16 DIAGNOSIS — Z923 Personal history of irradiation: Secondary | ICD-10-CM | POA: Insufficient documentation

## 2022-09-16 DIAGNOSIS — E1122 Type 2 diabetes mellitus with diabetic chronic kidney disease: Secondary | ICD-10-CM | POA: Diagnosis not present

## 2022-09-16 DIAGNOSIS — I129 Hypertensive chronic kidney disease with stage 1 through stage 4 chronic kidney disease, or unspecified chronic kidney disease: Secondary | ICD-10-CM | POA: Diagnosis not present

## 2022-09-16 DIAGNOSIS — D509 Iron deficiency anemia, unspecified: Secondary | ICD-10-CM | POA: Insufficient documentation

## 2022-09-16 DIAGNOSIS — C50811 Malignant neoplasm of overlapping sites of right female breast: Secondary | ICD-10-CM | POA: Diagnosis present

## 2022-09-16 DIAGNOSIS — Z79810 Long term (current) use of selective estrogen receptor modulators (SERMs): Secondary | ICD-10-CM | POA: Diagnosis not present

## 2022-09-16 DIAGNOSIS — Z79899 Other long term (current) drug therapy: Secondary | ICD-10-CM | POA: Insufficient documentation

## 2022-09-16 DIAGNOSIS — N183 Chronic kidney disease, stage 3 unspecified: Secondary | ICD-10-CM | POA: Diagnosis not present

## 2022-09-16 DIAGNOSIS — Z7982 Long term (current) use of aspirin: Secondary | ICD-10-CM | POA: Diagnosis not present

## 2022-09-16 DIAGNOSIS — Z170 Estrogen receptor positive status [ER+]: Secondary | ICD-10-CM

## 2022-09-16 DIAGNOSIS — C50111 Malignant neoplasm of central portion of right female breast: Secondary | ICD-10-CM

## 2022-09-16 LAB — CMP (CANCER CENTER ONLY)
ALT: 10 U/L (ref 0–44)
AST: 24 U/L (ref 15–41)
Albumin: 3.8 g/dL (ref 3.5–5.0)
Alkaline Phosphatase: 54 U/L (ref 38–126)
Anion gap: 8 (ref 5–15)
BUN: 30 mg/dL — ABNORMAL HIGH (ref 8–23)
CO2: 25 mmol/L (ref 22–32)
Calcium: 9.7 mg/dL (ref 8.9–10.3)
Chloride: 105 mmol/L (ref 98–111)
Creatinine: 1.65 mg/dL — ABNORMAL HIGH (ref 0.44–1.00)
GFR, Estimated: 30 mL/min — ABNORMAL LOW (ref >60)
Glucose, Bld: 156 mg/dL — ABNORMAL HIGH (ref 70–99)
Potassium: 3.4 mmol/L — ABNORMAL LOW (ref 3.5–5.1)
Sodium: 138 mmol/L (ref 135–145)
Total Bilirubin: 0.7 mg/dL (ref 0.3–1.2)
Total Protein: 6.6 g/dL (ref 6.5–8.1)

## 2022-09-16 LAB — CBC WITH DIFFERENTIAL (CANCER CENTER ONLY)
Abs Immature Granulocytes: 0.01 10*3/uL (ref 0.00–0.07)
Basophils Absolute: 0 10*3/uL (ref 0.0–0.1)
Basophils Relative: 1 %
Eosinophils Absolute: 0.1 10*3/uL (ref 0.0–0.5)
Eosinophils Relative: 3 %
HCT: 36.1 % (ref 36.0–46.0)
Hemoglobin: 11.8 g/dL — ABNORMAL LOW (ref 12.0–15.0)
Immature Granulocytes: 0 %
Lymphocytes Relative: 31 %
Lymphs Abs: 1.6 10*3/uL (ref 0.7–4.0)
MCH: 30.9 pg (ref 26.0–34.0)
MCHC: 32.7 g/dL (ref 30.0–36.0)
MCV: 94.5 fL (ref 80.0–100.0)
Monocytes Absolute: 0.6 10*3/uL (ref 0.1–1.0)
Monocytes Relative: 11 %
Neutro Abs: 2.9 10*3/uL (ref 1.7–7.7)
Neutrophils Relative %: 54 %
Platelet Count: 106 10*3/uL — ABNORMAL LOW (ref 150–400)
RBC: 3.82 MIL/uL — ABNORMAL LOW (ref 3.87–5.11)
RDW: 11.9 % (ref 11.5–15.5)
WBC Count: 5.2 10*3/uL (ref 4.0–10.5)
nRBC: 0 % (ref 0.0–0.2)

## 2022-09-16 MED ORDER — TAMOXIFEN CITRATE 20 MG PO TABS
20.0000 mg | ORAL_TABLET | Freq: Every day | ORAL | 3 refills | Status: DC
Start: 2022-09-16 — End: 2023-03-09

## 2022-09-18 LAB — CANCER ANTIGEN 27.29: CA 27.29: 29.1 U/mL (ref 0.0–38.6)

## 2022-10-15 ENCOUNTER — Other Ambulatory Visit: Payer: Self-pay | Admitting: Nurse Practitioner

## 2022-10-15 ENCOUNTER — Ambulatory Visit
Admission: RE | Admit: 2022-10-15 | Discharge: 2022-10-15 | Disposition: A | Discharge status: Home / Self Care | Payer: Medicare PPO | Source: Ambulatory Visit | Attending: Nurse Practitioner | Admitting: Nurse Practitioner

## 2022-10-15 DIAGNOSIS — M159 Polyosteoarthritis, unspecified: Secondary | ICD-10-CM

## 2022-11-24 ENCOUNTER — Ambulatory Visit: Payer: Medicare PPO | Admitting: Orthopaedic Surgery

## 2022-11-25 ENCOUNTER — Ambulatory Visit: Payer: Medicare PPO | Admitting: Orthopaedic Surgery

## 2022-11-25 ENCOUNTER — Encounter: Payer: Self-pay | Admitting: Orthopaedic Surgery

## 2022-11-25 DIAGNOSIS — M25511 Pain in right shoulder: Secondary | ICD-10-CM

## 2022-11-25 DIAGNOSIS — G8929 Other chronic pain: Secondary | ICD-10-CM

## 2022-11-25 NOTE — Progress Notes (Signed)
Office Visit Note   Patient: Stacy Walls           Date of Birth: 1933-01-21           MRN: [id number] Visit Date: 11/25/2022              Requested by: Karenann Cai, NP [address] [place],  [place] [address] PCP: Raymon Mutton., FNP   Assessment & Plan: Visit Diagnoses:  1. Chronic right shoulder pain     Plan: Impression is resolved right shoulder pain.  At this point, patient is not exhibiting any pain.  She will return if her symptoms return.  Call with concerns or questions.  Follow-Up Instructions: Return if symptoms worsen or fail to improve.   Orders:  No orders of the defined types were placed in this encounter.  No orders of the defined types were placed in this encounter.     Procedures: No procedures performed   Clinical Data: No additional findings.   Subjective: Chief Complaint  Patient presents with   Right Shoulder - Pain    HPI patient is a pleasant 89 year old female who comes in today with right shoulder pain for the past 5 weeks.  Symptoms began upon awakening from her sleep.  She denies any injury or change in activity.  She saw her PCP soon after where it sounds like she underwent to IM injections.  She notes that this took her pain away.  She is currently not in any pain.  She does tell me the pain she was having was to the entire shoulder and worse with movement of that shoulder.  Not currently taking anything for pain.  No weakness.  Review of Systems as detailed in HPI.  All others reviewed and are negative.   Objective: Vital Signs: There were no vitals taken for this visit.  Physical Exam well-developed well-nourished female no acute distress.  Alert and oriented x 3.  Ortho Exam right shoulder exam: Forward flexion to approximately 160 degrees.  Slight limited external rotation but this is without pain.  Internal rotation to L5.  Negative decant testing.  No tenderness to the [place] joint.  Full strength throughout.   She is neurovascularly intact distally.  Specialty Comments:  No specialty comments available.  Imaging: X-rays reviewed by me of the right shoulder in canopy reveal moderate before meals and glenohumeral degenerative changes   PMFS History: Patient Active Problem List   Diagnosis Date Noted   Pain in right hand 11/19/2021   Chronic diastolic heart failure (HCC) 01/30/2021   Other secondary pulmonary hypertension (HCC) 01/30/2021   Debility 03/27/2019   Morbid obesity (HCC) 03/27/2019   Hyponatremia 03/18/2019   Acute renal failure superimposed on stage 3 chronic kidney disease (HCC) 03/18/2019   Cancer of central portion of right female breast (HCC) 04/08/2018   Cough 10/11/2017   Abnormal endometrial ultrasound 08/03/2017   Chronic scapular pain 08/02/2017   Pelvic pain 07/27/2017   Headache 05/05/2017   Osteoarthritis 07/29/2016   UTI (urinary tract infection) 09/04/2015   Acute sinus infection 07/31/2015   Screening for breast cancer 01/31/2013   Diabetes mellitus with renal manifestation (HCC) 08/24/2012   Iron deficiency anemia, unspecified 01/27/2012   Screening examination for infectious disease 01/27/2012   Encounter for long-term (current) use of other medications 01/12/2011   OTITIS EXTERNA 03/04/2010   Shoulder pain, left 12/02/2009   Pancytopenia (HCC) 10/09/2009   CLOSTRIDIUM DIFFICILE COLITIS 08/23/2009   CAD (coronary artery disease)  08/23/2009   CKD (chronic kidney disease), stage III (HCC) 08/23/2009   Chest pain of uncertain etiology 08/23/2009   Acute cholecystitis 08/15/2009   Dyshidrosis 04/08/2009   DIZZINESS 07/03/2008   Secondary renal hyperparathyroidism (HCC) 06/15/2007   Hypothyroidism 04/20/2007   NECK PAIN 04/20/2007   NECK MASS 04/05/2007   GERD 03/09/2007   Dyslipidemia 09/20/2006   GOUT 09/20/2006   Essential hypertension 09/20/2006   ALLERGIC RHINITIS 09/20/2006   Osteoporosis 09/20/2006   URINARY INCONTINENCE 09/20/2006   Past  Medical History:  Diagnosis Date   Acute cholecystitis    Allergic rhinitis    Anemia    Arthritis    Cancer (HCC)    Right breast   Chest pain    Chronic renal insufficiency    followed by Dr Hyman Hopes stage 3   Clostridium difficile colitis    Constipation    Coronary atherosclerosis of native coronary vessel    Cough    Depression    situational - husband died 2018-05-29   Diabetes mellitus    Dizziness    DM2 (diabetes mellitus, type 2) (HCC)    Dyshidrosis    Family history of adverse reaction to anesthesia    daughter - nausea   GERD (gastroesophageal reflux disease)    Gout    HLD (hyperlipidemia)    HTN (hypertension)    Hypothyroidism    Neck mass    Neck pain    Osteoporosis    Personal history of radiation therapy 12/2018   Routine general medical examination at a health care facility    Secondary hyperparathyroidism (of renal origin)    Urinary incontinence    Urinary tract infection     Family History  Problem Relation Age of Onset   Coronary artery disease Father    Stroke Father    Diabetes Father    Diabetes Sister    Stroke Sister    Diabetes Brother    Cancer Brother        liver caner   Breast cancer Neg Hx     Past Surgical History:  Procedure Laterality Date   bone density  08/21/05   BREAST BIOPSY Right 03/30/2018   x3   BREAST BIOPSY Right 04/25/2018   BREAST LUMPECTOMY Right 06/07/2018   BREAST LUMPECTOMY WITH RADIOACTIVE SEED AND AXILLARY LYMPH NODE DISSECTION Right 06/07/2018   Procedure: RIGHT BREAST LUMPECTOMY WITH BRACKETED RADIOACTIVE SEEDS AND RIGHT COMPLETE AXILLARY LYMPH NODE DISSECTION;  Surgeon: Claud Kelp, MD;  Location: [facility] OR;  Service: General;  Laterality: Right;   C-section (other)  1977   CHOLECYSTECTOMY     ELECTROCARDIOGRAM  02/01/06   L ankle surgery Left 1988   total left knee Left 2007   Social History   Occupational History   Occupation: retired   Tobacco Use   Smoking status: Never   Smokeless tobacco:  Never  Vaping Use   Vaping status: Never Used  Substance and Sexual Activity   Alcohol use: No   Drug use: No   Sexual activity: Not on file

## 2022-12-16 ENCOUNTER — Ambulatory Visit (INDEPENDENT_AMBULATORY_CARE_PROVIDER_SITE_OTHER): Payer: Medicare PPO | Admitting: Podiatry

## 2022-12-16 DIAGNOSIS — E1122 Type 2 diabetes mellitus with diabetic chronic kidney disease: Secondary | ICD-10-CM | POA: Diagnosis not present

## 2022-12-16 DIAGNOSIS — B351 Tinea unguium: Secondary | ICD-10-CM | POA: Diagnosis not present

## 2022-12-16 DIAGNOSIS — M79674 Pain in right toe(s): Secondary | ICD-10-CM

## 2022-12-16 DIAGNOSIS — N1832 Type 2 diabetes mellitus with diabetic chronic kidney disease: Secondary | ICD-10-CM

## 2022-12-16 DIAGNOSIS — M79675 Pain in left toe(s): Secondary | ICD-10-CM

## 2022-12-19 ENCOUNTER — Encounter: Payer: Self-pay | Admitting: Podiatry

## 2022-12-19 NOTE — Progress Notes (Signed)
  Subjective:  Patient ID: Stacy Walls, female    DOB: 1933/03/27,  MRN: [id number]  Stacy Walls presents to clinic today for at risk foot care. Pt has h/o NIDDM with chronic kidney disease and painful elongated mycotic toenails 1-5 bilaterally which are tender when wearing enclosed shoe gear. Pain is relieved with periodic professional debridement.  Chief Complaint  Patient presents with   Nail Problem    DFC,Referring Provider Raymon Mutton., FNP,lov:  4 weeks ago A1C:5.2,BS:unknown      New problem(s): None.   PCP is Karenann Cai, NP.  Allergies  Allergen Reactions   Ace Inhibitors Cough   Azithromycin Other (See Comments)    Unknown   Pioglitazone Other (See Comments)    REACTION: edema   Sulfonamide Derivatives Rash   Tramadol Other (See Comments)    dizziness   Amlodipine Other (See Comments)    Unfavorable reaction/headache    Review of Systems: Negative except as noted in the HPI.  Objective: No changes noted in today's physical examination. There were no vitals filed for this visit. Stacy Walls is a pleasant 89 y.o. female in NAD. AAO x 3.  Vascular Examination: CFT <3 seconds b/l LE. Palpable DP pulse(s) b/l LE. Faintly palpable PT pulse(s) b/l LE. Pedal hair absent. No pain with calf compression b/l. Lower extremity skin temperature gradient within normal limits. Nonpitting edema noted bilateral ankles. No ischemia or gangrene noted b/l LE. No cyanosis or clubbing noted b/l LE.  Dermatological Examination: Pedal skin thin, shiny and atrophic b/l LE. No open wounds b/l LE. No interdigital macerations noted b/l LE. Toenails 1-5 b/l elongated, discolored, dystrophic, thickened, crumbly with subungual debris and tenderness to dorsal palpation. No hyperkeratotic nor porokeratotic lesions present on today's visit.  Neurological Examination: Protective sensation intact 5/5 intact bilaterally with 10g monofilament b/l.  Vibratory sensation intact b/l.  Musculoskeletal Examination: Muscle strength 5/5 to all lower extremity muscle groups bilaterally. No pain, crepitus or joint limitation noted with ROM bilateral LE. Pes planus deformity noted bilateral LE. Utilizes cane for ambulation assistance.  Assessment/Plan: 1. Pain due to onychomycosis of toenails of both feet   2. Type 2 diabetes mellitus with stage 3b chronic kidney disease, without long-term current use of insulin (HCC)    -Consent given for treatment as described below: -Examined patient. -Continue supportive shoe gear daily. -Mycotic toenails 1-5 bilaterally were debrided in length and girth with sterile nail nippers and dremel without incident. -Patient/POA to call should there be question/concern in the interim.   Return in about 3 months (around 03/18/2023).  Freddie Breech, DPM

## 2023-02-15 NOTE — Progress Notes (Unsigned)
Office Visit    Patient Name: Stacy Walls Date of Encounter: 02/16/2023  PCP:  Karenann Cai, NP   [facility]  Cardiologist:  Donato Schultz, MD  Advanced Practice Provider:  No care team member to display Electrophysiologist:  None   HPI    Stacy Walls is a 89 y.o. female with a past medical history significant for coronary artery disease, hypertension, diabetes, hyperlipidemia, chronic kidney disease, and breast cancer status post partial cystectomy in 2020 with radiation and tamoxifen presents today for annual follow-up visit.  She had coronary calcifications on CT scan in 2020 with aortic atherosclerosis as well.  Back in November 2020 she was admitted with weakness and mental status changes with headache and hyponatremia along with acute kidney injury.  She had prolonged QT on EKG after being treated with Levaquin for UTI.  HCTZ was stopped and her QT and sodium level improved.  After the hospital stay she went to inpatient rehab.  Her blood pressure was challenging to control.  BNP was elevated.  She was slowly getting stronger and stronger.  She was seen last year and at that time she had not been feeling well.  She had a sinus infection but received medication for it and it was resolving.  She was feeling better and dizziness was improving.  Blood pressure had been well controlled per her primary doctor.  She had also been compliant on her thyroid meds.  She last saw me 10/23, she has some chest pain occasionally and after 1 nitroglycerin it goes away.  No shortness of breath or fluttering in her chest during that time.  We discussed if it becomes more frequent or more severe she is to call our office for an ischemic work-up.  Overall, she feels well.  She is unsure of her medications today so we have arranged for her to meet with one of our Pharm.D.'s.  She is tolerating all her medications.  EKG was reviewed with the  patient.  Today, she history of heart disease and diabetes, reports occasional chest pain that is relieved with one nitroglycerin tablet. She denies any recent increase in frequency or severity of chest pain, and denies any associated symptoms such as palpitations or shortness of breath. She has been compliant with her medications, including Lasix 40mg  daily, and has not noticed any adverse effects. She reports occasional lower extremity swelling, particularly after standing for prolonged periods, but this resolves with leg elevation. She has been managing her diabetes with glipizide 5mg  daily, and reports good blood sugar control. She has some kidney disease, but this has been stable. She lives independently and is generally able to do what she wants to do, with assistance from her children for transportation.  Reports no shortness of breath nor dyspnea on exertion. No orthopnea, PND. Reports no palpitations.     Past Medical History    Past Medical History:  Diagnosis Date   Acute cholecystitis    Allergic rhinitis    Anemia    Arthritis    Cancer (HCC)    Right breast   Chest pain    Chronic renal insufficiency    followed by Dr Hyman Hopes stage 3   Clostridium difficile colitis    Constipation    Coronary atherosclerosis of native coronary vessel    Cough    Depression    situational - husband died 2018/05/04   Diabetes mellitus    Dizziness    DM2 (diabetes mellitus, type  2) (HCC)    Dyshidrosis    Family history of adverse reaction to anesthesia    daughter - nausea   GERD (gastroesophageal reflux disease)    Gout    HLD (hyperlipidemia)    HTN (hypertension)    Hypothyroidism    Neck mass    Neck pain    Osteoporosis    Personal history of radiation therapy 12/2018   Routine general medical examination at a health care facility    Secondary hyperparathyroidism (of renal origin)    Urinary incontinence    Urinary tract infection    Past Surgical History:  Procedure  Laterality Date   bone density  08/21/05   BREAST BIOPSY Right 03/30/2018   x3   BREAST BIOPSY Right 04/25/2018   BREAST LUMPECTOMY Right 06/07/2018   BREAST LUMPECTOMY WITH RADIOACTIVE SEED AND AXILLARY LYMPH NODE DISSECTION Right 06/07/2018   Procedure: RIGHT BREAST LUMPECTOMY WITH BRACKETED RADIOACTIVE SEEDS AND RIGHT COMPLETE AXILLARY LYMPH NODE DISSECTION;  Surgeon: Claud Kelp, MD;  Location: [facility] OR;  Service: General;  Laterality: Right;   C-section (other)  1977   CHOLECYSTECTOMY     ELECTROCARDIOGRAM  02/01/06   L ankle surgery Left 1988   total left knee Left 2007    Allergies  Allergies  Allergen Reactions   Ace Inhibitors Cough   Azithromycin Other (See Comments)    Unknown   Pioglitazone Other (See Comments)    REACTION: edema   Sulfonamide Derivatives Rash   Tramadol Other (See Comments)    dizziness   Amlodipine Other (See Comments)    Unfavorable reaction/headache    EKGs/Labs/Other Studies Reviewed:   The following studies were reviewed today:  Echo 03/18/2019  IMPRESSIONS     1. Left ventricular ejection fraction, by visual estimation, is 60 to  65%. The left ventricle has normal function. There is mildly increased  left ventricular hypertrophy.   2. Left ventricular diastolic parameters are consistent with Grade I  diastolic dysfunction (impaired relaxation).   3. Global right ventricle has normal systolic function.The right  ventricular size is normal. No increase in right ventricular wall  thickness.   4. Left atrial size was severely dilated.   5. Right atrial size was mildly dilated.   6. Mild mitral annular calcification.   7. The mitral valve is mildly calcified. Moderate mitral valve  regurgitation.   8. The tricuspid valve is normal in structure. Tricuspid valve  regurgitation is trivial.   9. The aortic valve is normal in structure. Aortic valve regurgitation is  trivial.  10. The pulmonic valve was normal in structure. Pulmonic  valve  regurgitation is trivial.  11. Moderately elevated pulmonary artery systolic pressure.  12. The inferior vena cava is normal in size with <50% respiratory  variability, suggesting right atrial pressure of 8 mmHg.   FINDINGS   Left Ventricle: Left ventricular ejection fraction, by visual estimation,  is 60 to 65%. The left ventricle has normal function. There is mildly  increased left ventricular hypertrophy. Left ventricular diastolic  parameters are consistent with Grade I  diastolic dysfunction (impaired relaxation).   Right Ventricle: The right ventricular size is normal. No increase in  right ventricular wall thickness. Global RV systolic function is has  normal systolic function. The tricuspid regurgitant velocity is 3.39 m/s,  and with an assumed right atrial pressure   of 10 mmHg, the estimated right ventricular systolic pressure is  moderately elevated at 56.0 mmHg.   Left Atrium: Left atrial size was severely  dilated.   Right Atrium: Right atrial size was mildly dilated   Pericardium: There is no evidence of pericardial effusion.   Mitral Valve: The mitral valve is normal in structure. There is mild  calcification of the mitral valve leaflet(s). Mild mitral annular  calcification. Moderate mitral valve regurgitation.   Tricuspid Valve: The tricuspid valve is normal in structure. Tricuspid  valve regurgitation is trivial.   Aortic Valve: The aortic valve is normal in structure. Aortic valve  regurgitation is trivial.   Pulmonic Valve: The pulmonic valve was normal in structure. Pulmonic valve  regurgitation is trivial.   Aorta: The aortic root and ascending aorta are structurally normal, with  no evidence of dilitation.   Venous: The inferior vena cava is normal in size with less than 50%  respiratory variability, suggesting right atrial pressure of 8 mmHg.   IAS/Shunts: No atrial level shunt detected by color flow Doppler.    EKG:  EKG is  ordered  today.  The ekg ordered today demonstrates normal sinus rhythm, rate 67 bpm  Recent Labs: 09/16/2022: ALT 10; BUN 30; Creatinine 1.65; Hemoglobin 11.8; Platelet Count 106; Potassium 3.4; Sodium 138  Recent Lipid Panel    Component Value Date/Time   CHOL 260 (H) 09/07/2017 1130   TRIG 274.0 (H) 09/07/2017 1130   HDL 39.70 09/07/2017 1130   CHOLHDL 7 09/07/2017 1130   VLDL 54.8 (H) 09/07/2017 1130   LDLCALC 86 04/01/2015 1200   LDLDIRECT 159.0 09/07/2017 1130    Home Medications   Current Meds  Medication Sig   acetaminophen (TYLENOL) 325 MG tablet Take 1-2 tablets (325-650 mg total) by mouth every 4 (four) hours as needed for mild pain.   allopurinol (ZYLOPRIM) 100 MG tablet Take 100 mg by mouth daily.   aspirin EC 81 MG tablet Take 81 mg by mouth daily.   atorvastatin (LIPITOR) 40 MG tablet Take 1 tablet (40 mg total) by mouth daily.   Carboxymeth-Glyc-Polysorb PF (REFRESH OPTIVE MEGA-3) 0.5-1-0.5 % SOLN Apply 1 drop to eye daily.   dapagliflozin propanediol (FARXIGA) 10 MG TABS tablet Take 10 mg by mouth daily.   famotidine (PEPCID) 20 MG tablet Take 20 mg by mouth 2 (two) times daily.   Ferrous Sulfate (IRON) 325 (65 Fe) MG TABS Take 1 tablet (325 mg total) by mouth daily.   fluticasone (FLONASE) 50 MCG/ACT nasal spray Place into both nostrils daily as needed.   furosemide (LASIX) 40 MG tablet Take 1 tablet (40 mg total) by mouth daily.   glipiZIDE (GLUCOTROL) 5 MG tablet Take 5 mg by mouth every morning.   hydrALAZINE (APRESOLINE) 50 MG tablet Take 1.5 tablets (75 mg total) by mouth 2 (two) times daily.   levocetirizine (XYZAL) 5 MG tablet Take 5 mg by mouth every evening.   levothyroxine (SYNTHROID) 88 MCG tablet Take 88 mcg by mouth daily.   liothyronine (CYTOMEL) 5 MCG tablet Take 5 mcg by mouth daily.   metoprolol tartrate (LOPRESSOR) 25 MG tablet Take 0.5 tablets (12.5 mg total) by mouth 2 (two) times daily.   Multiple Vitamins-Minerals (MULTIVITAMIN WOMEN 50+ PO) Take 1  tablet by mouth daily.    nitroGLYCERIN (NITROSTAT) 0.4 MG SL tablet PLACE 1 TABLET(0.4 MG TOTAL) UNDER THE TONGUE EVERY 5(FIVE) MINUTES AS NEEDED FOR CHEST PAIN.   senna (SENOKOT) 8.6 MG tablet Take 2 tablets by mouth daily.   tamoxifen (NOLVADEX) 20 MG tablet Take 1 tablet (20 mg total) by mouth daily.     Review of Systems  All other systems reviewed and are otherwise negative except as noted above.  Physical Exam    VS:  BP (!) 142/58   Pulse (!) 59   Ht 5\' 2"  (1.575 m)   Wt 210 lb (95.3 kg)   SpO2 96%   BMI 38.41 kg/m  , BMI Body mass index is 38.41 kg/m.  Wt Readings from Last 3 Encounters:  02/16/23 210 lb (95.3 kg)  09/16/22 207 lb 6.4 oz (94.1 kg)  03/16/22 205 lb (93 kg)     GEN: Well nourished, well developed, in no acute distress. HEENT: normal. Neck: Supple, no JVD, carotid bruits, or masses. Cardiac: RRR, no murmurs, rubs, or gallops. No clubbing, cyanosis,  1+ non pitting edema.  Radials/PT 2+ and equal bilaterally.  Respiratory:  Respirations regular and unlabored, clear to auscultation bilaterally. GI: Soft, nontender, nondistended. MS: No deformity or atrophy. Skin: Warm and dry, no rash. Neuro:  Strength and sensation are intact. Psych: Normal affect.  Assessment & Plan     Angina/CAD Occasional chest pain relieved by one nitroglycerin tablet. No recent increase in frequency. -Continue current regimen.  Hypertension Blood pressure slightly elevated at 142/58, but acceptable for patient's age and to avoid hypotensive symptoms. -Continue current regimen.  Dependent Edema Mild lower extremity swelling with prolonged standing, resolves with elevation. -Consider use of compression stockings when on feet for extended periods. -Elevate legs when possible.  Type 2 Diabetes Mellitus On Glipizide 5mg  daily, blood sugars well controlled. -Continue current regimen.  Chronic Kidney Disease Stable, recent labs not available for review. -Request  recent labs from primary care provider for review. -last creatinine 1.6  Medication Management Patient met with PharmD for medication review, no issues reported. -Ensure refills are available for cardiac medications including Lopressor, Hydralazine, Farxiga, Lipitor, and Lasix.    Disposition: Follow up 1 year with Donato Schultz, MD or APP.  Signed, Sharlene Dory, PA-C 02/16/2023, 11:48 AM [facility]

## 2023-02-16 ENCOUNTER — Ambulatory Visit: Payer: Medicare PPO | Attending: Physician Assistant | Admitting: Physician Assistant

## 2023-02-16 ENCOUNTER — Encounter (HOSPITAL_COMMUNITY): Payer: Self-pay | Admitting: Nephrology

## 2023-02-16 ENCOUNTER — Encounter: Payer: Self-pay | Admitting: Physician Assistant

## 2023-02-16 VITALS — BP 142/58 | HR 59 | Ht 62.0 in | Wt 210.0 lb

## 2023-02-16 DIAGNOSIS — N1832 Chronic kidney disease, stage 3b: Secondary | ICD-10-CM

## 2023-02-16 DIAGNOSIS — I2510 Atherosclerotic heart disease of native coronary artery without angina pectoris: Secondary | ICD-10-CM | POA: Diagnosis not present

## 2023-02-16 DIAGNOSIS — Z79899 Other long term (current) drug therapy: Secondary | ICD-10-CM

## 2023-02-16 DIAGNOSIS — I5032 Chronic diastolic (congestive) heart failure: Secondary | ICD-10-CM

## 2023-02-16 DIAGNOSIS — R079 Chest pain, unspecified: Secondary | ICD-10-CM

## 2023-02-16 NOTE — Patient Instructions (Addendum)
Medication Instructions:  Your physician recommends that you continue on your current medications as directed. Please refer to the Current Medication list given to you today.  *If you need a refill on your cardiac medications before your next appointment, please call your pharmacy*  Lab Work: None ordered today. Have PCP send labs while you can.  If you have labs (blood work) drawn today and your tests are completely normal, you will receive your results only by: MyChart Message (if you have MyChart) OR A paper copy in the mail If you have any lab test that is abnormal or we need to change your treatment, we will call you to review the results.  Testing/Procedures: None ordered.  Follow-Up: At [facility], you and your health needs are our priority.  As part of our continuing mission to provide you with exceptional heart care, we have created designated Provider Care Teams.  These Care Teams include your primary Cardiologist (physician) and Advanced Practice Providers (APPs -  Physician Assistants and Nurse Practitioners) who all work together to provide you with the care you need, when you need it.   Your next appointment:   1 year(s)  The format for your next appointment:   In Person  Provider:   Dr Anne Fu  Important Information About Sugar

## 2023-02-20 ENCOUNTER — Other Ambulatory Visit: Payer: Self-pay | Admitting: Cardiology

## 2023-03-02 ENCOUNTER — Encounter (HOSPITAL_COMMUNITY): Payer: Self-pay | Admitting: Nephrology

## 2023-03-06 NOTE — Progress Notes (Signed)
Patient Care Team: Stacy Cai, NP as PCP - General (Nurse Practitioner) Stacy Bathe, MD as PCP - Cardiology (Cardiology) Stacy Coil, MD (Nephrology) Stacy Section, MD (Orthopedic Surgery) Stacy Harries, MD as Consulting Physician (Obstetrics and Gynecology) Stacy Mood, MD as Consulting Physician (Hematology) Stacy Kelp, MD as Consulting Physician (General Surgery) Stacy Samples, NP as Nurse Practitioner (Nurse Practitioner) Stacy Peak, MD as Attending Physician (Radiation Oncology)   CHIEF COMPLAINT: Follow up right breast cancer   Oncology History Overview Note  Cancer Staging Cancer of central portion of right female breast [facility]) Staging form: Breast, AJCC 8th Edition - Clinical stage from 03/30/2018: Stage IB (cT1c(m), cN1, cM0, G2, ER+, PR+, HER2-) - Signed by Stacy Mood, MD on 04/08/2018 - Pathologic stage from 06/07/2018: Stage IB (pT2(m), pN2a, cM0, G2, ER+, PR+, HER2-) - Signed by Stacy Socks, NP on 06/22/2018     Cancer of central portion of right female breast (HCC)  01/13/2018 Mammogram   Diagnostic Mammogram 01/13/18  RECOMMENDATION: 1. Ultrasound-guided biopsies of both masses in the RIGHT breast identified by ultrasound at the 12 o'clock axis (7 cm from the nipple measuring 1.1 x 0.8 x 0.7 cm and 5 cm from the nipple measuring 1 x 0.8 x 0.9 cm respectively). 2. Ultrasound-guided biopsy of the most prominent lymph node in the RIGHT axilla. 3. Postprocedure mammogram to ensure that the masses correspond to the extent of the asymmetry/distortion seen on mammogram. Additional stereotactic biopsy may be needed if the clips do not approximate the anterior and posterior extents. 4. Depending on the location of the post biopsy clips, and if breast conservation surgery is considered, stereotactic biopsy may be also needed for the coarse heterogeneous calcifications within the slightly outer RIGHT breast, measuring 8 mm extent, located  1.2 cm from the asymmetry/distortion.   03/30/2018 Cancer Staging   Staging form: Breast, AJCC 8th Edition - Clinical stage from 03/30/2018: Stage IB (cT1c(m), cN1, cM0, G2, ER+, PR+, HER2-) - Signed by Stacy Mood, MD on 04/08/2018   03/30/2018 Initial Biopsy   Diagnosis 03/30/18 1. Breast, right, needle core biopsy, 12 o'clock position, 5cm from nipple - INVASIVE LOBULAR CARCINOMA, GRADE 2. SEE NOTE. - LOBULAR CARCINOMA IN SITU, INTERMEDIATE NUCLEAR GRADE. 2. Breast, right, needle core biopsy, 12 o'clock position 7cfn - INVASIVE LOBULAR CARCINOMA, GRADE 2. SEE NOTE. 1 of 3 FINAL for Stacy Walls ([id number]) Diagnosis(continued) - LOBULAR CARCINOMA IN SITU, INTERMEDIATE NUCLEAR GRADE. 3. Lymph node, needle/core biopsy, right axilla - METASTATIC LOBULAR CARCINOMA TO LYMPH NODE.   03/30/2018 Receptors her2   Results: IMMUNOHISTOCHEMICAL AND MORPHOMETRIC ANALYSIS PERFORMED MANUALLY The tumor cells are NEGATIVE for Her2 (1+). Estrogen Receptor: 100%, POSITIVE, STRONG STAINING INTENSITY Progesterone Receptor: 70%, POSITIVE, STRONG STAINING INTENSITY Proliferation Marker Ki67: 10%   04/08/2018 Initial Diagnosis   Cancer of central portion of right female breast (HCC)   04/27/2018 Imaging   CT CAP W Contrast 04/27/18  IMPRESSION: 1. Right axillary and right subpectoral lymphadenopathy, suspicious for metastatic disease. PET-CT may prove helpful to further evaluate. 2. Cholelithiasis. 3. Colonic diverticulosis without diverticulitis. 4.  Aortic Atherosclerois (ICD10-170.0)   04/28/2018 Imaging   Bone Scan 04/28/18  IMPRESSION: No scintigraphic evidence skeletal metastasis. Severe degenerative uptake in the medial compartment RIGHT knee.   06/07/2018 Cancer Staging   Staging form: Breast, AJCC 8th Edition - Pathologic stage from 06/07/2018: Stage IB (pT2(m), pN2a, cM0, G2, ER+, PR+, HER2-) - Signed by Stacy Socks, NP on 06/22/2018   06/07/2018 Surgery  RIGHT  BREAST LUMPECTOMY WITH BRACKETED RADIOACTIVE SEEDS AND RIGHT COMPLETE AXILLARY LYMPH NODE DISSECTION by Dr. Derrell Walls 06/07/18    06/07/2018 Pathology Results   Diagnosis 06/07/18 1. Breast, lumpectomy, right with radioactive seeds - INVASIVE LOBULAR CARCINOMA, MULTIFOCAL, LARGER FOCUS IS 3 CM, NOTTINGHAM GRADE 2 OF 3. - MARGINS OF RESECTION ARE NOT INVOLVED (CLOSEST MARGIN: LESS THAN 1 MM, ANTERIOR). - BIOPSY SITE CHANGES. - SEE ONCOLOGY TABLE. 2. Lymph nodes, regional resection, right axillary contents - METASTATIC CARCINOMA PRESENT IN SEVEN OF FOURTEEN LYMPH NODES (7/14). 3. Breast, excision, inferior margin - BREAST PARENCHYMA, NEGATIVE FOR CARCINOMA.   06/2018 -  Anti-estrogen oral therapy   Tamoxifen 20mg  daily starting in March or April 2020.    10/13/2018 - 11/25/2018 Radiation Therapy   Adjuvant radiation to right breat per Dr. Basilio Walls   05/31/2019 Survivorship   SCP delivered by Stacy Glad, NP       CURRENT THERAPY:  Tamoxifen 20 mg daily starting march/April 2020   INTERVAL HISTORY Stacy Walls returns for follow up as scheduled. Last seen by me 09/16/22.  She is feeling well in general with no significant changes to her overall health.  Denies breast concerns such as new lump/mass, nipple discharge or inversion, or skin change.  She continues tamoxifen, tolerating well without significant side effects.  ROS  All other systems reviewed and negative  Past Medical History:  Diagnosis Date   Acute cholecystitis    Allergic rhinitis    Anemia    Arthritis    Cancer (HCC)    Right breast   Chest pain    Chronic renal insufficiency    followed by Dr Hyman Hopes stage 3   Clostridium difficile colitis    Constipation    Coronary atherosclerosis of native coronary vessel    Cough    Depression    situational - husband died May 17, 2018   Diabetes mellitus    Dizziness    DM2 (diabetes mellitus, type 2) (HCC)    Dyshidrosis    Family history of adverse reaction to anesthesia     daughter - nausea   GERD (gastroesophageal reflux disease)    Gout    HLD (hyperlipidemia)    HTN (hypertension)    Hypothyroidism    Neck mass    Neck pain    Osteoporosis    Personal history of radiation therapy 12/2018   Routine general medical examination at a health care facility    Secondary hyperparathyroidism (of renal origin)    Urinary incontinence    Urinary tract infection      Past Surgical History:  Procedure Laterality Date   bone density  08/21/05   BREAST BIOPSY Right 03/30/2018   x3   BREAST BIOPSY Right 04/25/2018   BREAST LUMPECTOMY Right 06/07/2018   BREAST LUMPECTOMY WITH RADIOACTIVE SEED AND AXILLARY LYMPH NODE DISSECTION Right 06/07/2018   Procedure: RIGHT BREAST LUMPECTOMY WITH BRACKETED RADIOACTIVE SEEDS AND RIGHT COMPLETE AXILLARY LYMPH NODE DISSECTION;  Surgeon: Stacy Kelp, MD;  Location: [facility] OR;  Service: General;  Laterality: Right;   C-Walls (other)  1977   CHOLECYSTECTOMY     ELECTROCARDIOGRAM  02/01/06   L ankle surgery Left 1988   total left knee Left 2005-05-17     Outpatient Encounter Medications as of 03/09/2023  Medication Sig   acetaminophen (TYLENOL) 325 MG tablet Take 1-2 tablets (325-650 mg total) by mouth every 4 (four) hours as needed for mild pain.   allopurinol (ZYLOPRIM) 100 MG tablet Take 100 mg by mouth  daily.   amoxicillin (AMOXIL) 250 MG capsule Take 250 mg by mouth every 12 (twelve) hours.   aspirin EC 81 MG tablet Take 81 mg by mouth daily.   atorvastatin (LIPITOR) 40 MG tablet Take 1 tablet (40 mg total) by mouth daily.   Carboxymeth-Glyc-Polysorb PF (REFRESH OPTIVE MEGA-3) 0.5-1-0.5 % SOLN Apply 1 drop to eye daily.   dapagliflozin propanediol (FARXIGA) 10 MG TABS tablet Take 10 mg by mouth daily.   famotidine (PEPCID) 20 MG tablet Take 20 mg by mouth 2 (two) times daily.   Ferrous Sulfate (IRON) 325 (65 Fe) MG TABS Take 1 tablet (325 mg total) by mouth daily.   furosemide (LASIX) 40 MG tablet TAKE 1 TABLET(40 MG) BY MOUTH  DAILY   glipiZIDE (GLUCOTROL) 5 MG tablet Take 5 mg by mouth every morning.   hydrALAZINE (APRESOLINE) 50 MG tablet Take 1.5 tablets (75 mg total) by mouth 2 (two) times daily.   levocetirizine (XYZAL) 5 MG tablet Take 5 mg by mouth every evening.   levothyroxine (SYNTHROID) 88 MCG tablet Take 88 mcg by mouth daily.   liothyronine (CYTOMEL) 5 MCG tablet Take 5 mcg by mouth daily.   metoprolol tartrate (LOPRESSOR) 25 MG tablet Take 0.5 tablets (12.5 mg total) by mouth 2 (two) times daily.   Multiple Vitamins-Minerals (MULTIVITAMIN WOMEN 50+ PO) Take 1 tablet by mouth daily.    senna (SENOKOT) 8.6 MG tablet Take 2 tablets by mouth daily.   [DISCONTINUED] fluticasone (FLONASE) 50 MCG/ACT nasal spray Place into both nostrils daily as needed.   [DISCONTINUED] tamoxifen (NOLVADEX) 20 MG tablet Take 1 tablet (20 mg total) by mouth daily.   fluticasone (FLONASE) 50 MCG/ACT nasal spray Place 1 spray into both nostrils daily as needed.   nitroGLYCERIN (NITROSTAT) 0.4 MG SL tablet PLACE 1 TABLET(0.4 MG TOTAL) UNDER THE TONGUE EVERY 5(FIVE) MINUTES AS NEEDED FOR CHEST PAIN. (Patient not taking: Reported on 03/09/2023)   tamoxifen (NOLVADEX) 20 MG tablet Take 1 tablet (20 mg total) by mouth daily.   No facility-administered encounter medications on file as of 03/09/2023.     Today's Vitals   03/09/23 1049 03/09/23 1055  BP: (!) 137/54   Pulse: (!) 52   Resp: 15   Temp: 98.2 F (36.8 C)   TempSrc: Oral   SpO2: 98%   Weight: 210 lb (95.3 kg)   PainSc:  0-No pain   Body mass index is 38.41 kg/m.   PHYSICAL EXAM GENERAL:alert, no distress and comfortable SKIN: no rash  EYES: sclera clear LUNGS: clear with normal breathing effort HEART: regular rate & rhythm, no lower extremity edema NEURO: alert & oriented x 3 with fluent speech Breast exam: No nipple discharge or inversion.  S/p right lumpectomy and radiation, incisions completely healed.  Residual mild firmness and hyperpigmentation. No  palpable mass or nodularity in either breast or axilla that I could appreciate.   CBC    Component Value Date/Time   WBC 4.0 03/09/2023 1006   WBC 3.7 (L) 03/30/2019 0516   RBC 3.85 (L) 03/09/2023 1006   HGB 11.8 (L) 03/09/2023 1006   HCT 37.3 03/09/2023 1006   PLT 114 (L) 03/09/2023 1006   MCV 96.9 03/09/2023 1006   MCH 30.6 03/09/2023 1006   MCHC 31.6 03/09/2023 1006   RDW 12.1 03/09/2023 1006   LYMPHSABS 1.4 03/09/2023 1006   MONOABS 0.5 03/09/2023 1006   EOSABS 0.1 03/09/2023 1006   BASOSABS 0.0 03/09/2023 1006     CMP     Component  Value Date/Time   NA 142 03/09/2023 1006   NA 142 05/26/2022 1429   K 3.3 (L) 03/09/2023 1006   CL 107 03/09/2023 1006   CO2 29 03/09/2023 1006   GLUCOSE 97 03/09/2023 1006   BUN 20 03/09/2023 1006   BUN 18 05/26/2022 1429   CREATININE 1.48 (H) 03/09/2023 1006   CALCIUM 10.1 03/09/2023 1006   CALCIUM 10.5 01/12/2011 1116   PROT 6.5 03/09/2023 1006   ALBUMIN 3.7 03/09/2023 1006   AST 25 03/09/2023 1006   ALT 13 03/09/2023 1006   ALKPHOS 57 03/09/2023 1006   BILITOT 0.8 03/09/2023 1006   GFRNONAA 34 (L) 03/09/2023 1006   GFRAA 30 (L) 02/08/2020 1050   GFRAA 28 (L) 09/28/2019 1108     ASSESSMENT & PLAN:Stacy Walls is a 89 y.o. female with    1. Cancer of central right breast, invasive carcinoma, stage IB, pT2c(m)N2aM0, ER+/PR+, HER2-, Grade II -diagnosed in 03/2018. She underwent right breast lumpectomy and axillary lymph node dissection on 06/07/18. 7/14 positive lymph nodes.  -Dr. Mosetta Putt did not recommend adjuvant chemotherapy given her age, medical comorbidities, and lobular histology which is less sensitive to chemotherapy -She began adjuvant Tamoxifen in 06/2018 prior to radiation due to covid19-related delay. We discussed the risk of distant recurrence in lobular breast cancers. Plan to continue for a total of 5-10 years  -to reduce local recurrence risk, she completed adjuvant radiation per Dr. Basilio Walls on 11/25/18.  -Ms.  Krug is clinically doing well.  Tolerating tamoxifen.  Exam is benign, labs are stable.  Overall no clinical concern for recurrence -Continue breast cancer surveillance and tamoxifen -Next mammo due 05/2023 -Follow-up in 1 year, or sooner if needed   2. Iron deficient anemia and anemia of chronic disease secondary to CKD  -She was previously being treated with monthly EPO injections by Dr. Hyman Hopes which was stopped due to possible tumor growth.  -Hgb 10-12 range since 02/2020 - Takes oral iron   3. CKD, stage III, HTN, HL, DM -On Lasix, hydralazine, metoprolol, lipitor, Marcelline Deist -Managed by PCP, nephrologist and cardiologist.  -Continue f/up with Dr. Signe Colt and PCP   4. Osteoporosis, OA  -Seen on DEXA from 03/2014 with lowest T-score of -2.6 in femoral neck.  -She ambulates with cane  -DEXA on 11/2018 shows osteoporosis lowest T score -2.8. On tamoxifen which has bone strengthening qualities. Continue calcium and vitamin D while increasing weight bearing  -Repeat DEXA 04/02/2021 showed persistent osteoporosis but improved in the right femur back to T -2.6 -Repeat with next mammo 05/2023   5.  Routine health maintenance -Patient reportedly never had a colonoscopy.  -I encouraged her to continue healthy active lifestyle as tolerated and follow-up with her primary medical team       PLAN: -Labs reviewed -Continue breast cancer surveillance and tamoxifen, refilled -Mammogram and DEXA due 05/2023 -Follow-up in 1 year, or sooner if needed  Orders Placed This Encounter  Procedures   MM 3D SCREENING MAMMOGRAM BILATERAL BREAST    Standing Status:   Future    Standing Expiration Date:   03/08/2024    Order Specific Question:   Reason for Exam (SYMPTOM  OR DIAGNOSIS REQUIRED)    Answer:   Walls breast cancer 2019    Order Specific Question:   Preferred imaging location?    Answer:   [facility]   DG Bone Density    Standing Status:   Future    Standing Expiration Date:   03/08/2024  Order Specific Question:   Reason for Exam (SYMPTOM  OR DIAGNOSIS REQUIRED)    Answer:   h/o breast cancer, on tamoxifen, osteoporosis    Order Specific Question:   Preferred imaging location?    Answer:   [facility]      All questions were answered. The patient knows to call the clinic with any problems, questions or concerns. No barriers to learning were detected.   Stacy Glad, NP-C 03/09/2023

## 2023-03-09 ENCOUNTER — Inpatient Hospital Stay (HOSPITAL_BASED_OUTPATIENT_CLINIC_OR_DEPARTMENT_OTHER): Payer: Medicare PPO | Admitting: Nurse Practitioner

## 2023-03-09 ENCOUNTER — Inpatient Hospital Stay: Payer: Medicare PPO | Attending: Hematology

## 2023-03-09 ENCOUNTER — Encounter: Payer: Self-pay | Admitting: Nurse Practitioner

## 2023-03-09 DIAGNOSIS — D631 Anemia in chronic kidney disease: Secondary | ICD-10-CM | POA: Diagnosis not present

## 2023-03-09 DIAGNOSIS — N183 Chronic kidney disease, stage 3 unspecified: Secondary | ICD-10-CM | POA: Insufficient documentation

## 2023-03-09 DIAGNOSIS — C50111 Malignant neoplasm of central portion of right female breast: Secondary | ICD-10-CM

## 2023-03-09 DIAGNOSIS — Z923 Personal history of irradiation: Secondary | ICD-10-CM | POA: Insufficient documentation

## 2023-03-09 DIAGNOSIS — C50811 Malignant neoplasm of overlapping sites of right female breast: Secondary | ICD-10-CM | POA: Insufficient documentation

## 2023-03-09 DIAGNOSIS — D509 Iron deficiency anemia, unspecified: Secondary | ICD-10-CM | POA: Diagnosis not present

## 2023-03-09 DIAGNOSIS — Z170 Estrogen receptor positive status [ER+]: Secondary | ICD-10-CM

## 2023-03-09 DIAGNOSIS — Z79810 Long term (current) use of selective estrogen receptor modulators (SERMs): Secondary | ICD-10-CM | POA: Insufficient documentation

## 2023-03-09 LAB — CBC WITH DIFFERENTIAL (CANCER CENTER ONLY)
Abs Immature Granulocytes: 0 10*3/uL (ref 0.00–0.07)
Basophils Absolute: 0 10*3/uL (ref 0.0–0.1)
Basophils Relative: 1 %
Eosinophils Absolute: 0.1 10*3/uL (ref 0.0–0.5)
Eosinophils Relative: 3 %
HCT: 37.3 % (ref 36.0–46.0)
Hemoglobin: 11.8 g/dL — ABNORMAL LOW (ref 12.0–15.0)
Immature Granulocytes: 0 %
Lymphocytes Relative: 34 %
Lymphs Abs: 1.4 10*3/uL (ref 0.7–4.0)
MCH: 30.6 pg (ref 26.0–34.0)
MCHC: 31.6 g/dL (ref 30.0–36.0)
MCV: 96.9 fL (ref 80.0–100.0)
Monocytes Absolute: 0.5 10*3/uL (ref 0.1–1.0)
Monocytes Relative: 12 %
Neutro Abs: 2 10*3/uL (ref 1.7–7.7)
Neutrophils Relative %: 50 %
Platelet Count: 114 10*3/uL — ABNORMAL LOW (ref 150–400)
RBC: 3.85 MIL/uL — ABNORMAL LOW (ref 3.87–5.11)
RDW: 12.1 % (ref 11.5–15.5)
WBC Count: 4 10*3/uL (ref 4.0–10.5)
nRBC: 0 % (ref 0.0–0.2)

## 2023-03-09 LAB — CMP (CANCER CENTER ONLY)
ALT: 13 U/L (ref 0–44)
AST: 25 U/L (ref 15–41)
Albumin: 3.7 g/dL (ref 3.5–5.0)
Alkaline Phosphatase: 57 U/L (ref 38–126)
Anion gap: 6 (ref 5–15)
BUN: 20 mg/dL (ref 8–23)
CO2: 29 mmol/L (ref 22–32)
Calcium: 10.1 mg/dL (ref 8.9–10.3)
Chloride: 107 mmol/L (ref 98–111)
Creatinine: 1.48 mg/dL — ABNORMAL HIGH (ref 0.44–1.00)
GFR, Estimated: 34 mL/min — ABNORMAL LOW (ref >60)
Glucose, Bld: 97 mg/dL (ref 70–99)
Potassium: 3.3 mmol/L — ABNORMAL LOW (ref 3.5–5.1)
Sodium: 142 mmol/L (ref 135–145)
Total Bilirubin: 0.8 mg/dL (ref <1.2)
Total Protein: 6.5 g/dL (ref 6.5–8.1)

## 2023-03-09 MED ORDER — FLUTICASONE PROPIONATE 50 MCG/ACT NA SUSP: 1.0000 | Freq: Every day | NASAL | 3 refills | Status: AC | PRN

## 2023-03-09 MED ORDER — TAMOXIFEN CITRATE 20 MG PO TABS: 20.0000 mg | ORAL_TABLET | Freq: Every day | ORAL | 3 refills | Status: DC

## 2023-03-10 LAB — CANCER ANTIGEN 27.29: CA 27.29: 18.1 U/mL (ref 0.0–38.6)

## 2023-03-29 ENCOUNTER — Ambulatory Visit: Payer: Medicare PPO | Admitting: Podiatry

## 2023-04-22 ENCOUNTER — Ambulatory Visit: Payer: Medicare PPO | Admitting: Podiatry

## 2023-05-31 ENCOUNTER — Ambulatory Visit: Payer: Medicare PPO

## 2023-06-09 ENCOUNTER — Ambulatory Visit: Payer: Medicare PPO

## 2023-06-17 ENCOUNTER — Ambulatory Visit
Admission: RE | Admit: 2023-06-17 | Discharge: 2023-06-17 | Disposition: A | Discharge status: Home / Self Care | Payer: Medicare PPO | Source: Ambulatory Visit | Attending: Nurse Practitioner | Admitting: Nurse Practitioner

## 2023-06-17 ENCOUNTER — Encounter (HOSPITAL_COMMUNITY): Payer: Self-pay | Admitting: Nephrology

## 2023-06-17 DIAGNOSIS — C50111 Malignant neoplasm of central portion of right female breast: Secondary | ICD-10-CM

## 2023-06-29 ENCOUNTER — Encounter: Payer: Self-pay | Admitting: Podiatry

## 2023-06-29 ENCOUNTER — Ambulatory Visit (INDEPENDENT_AMBULATORY_CARE_PROVIDER_SITE_OTHER): Payer: Medicare PPO | Admitting: Podiatry

## 2023-06-29 VITALS — Ht 62.0 in | Wt 210.0 lb

## 2023-06-29 DIAGNOSIS — M79675 Pain in left toe(s): Secondary | ICD-10-CM | POA: Diagnosis not present

## 2023-06-29 DIAGNOSIS — N1832 Chronic kidney disease, stage 3b: Secondary | ICD-10-CM

## 2023-06-29 DIAGNOSIS — B351 Tinea unguium: Secondary | ICD-10-CM | POA: Diagnosis not present

## 2023-06-29 DIAGNOSIS — E1122 Type 2 diabetes mellitus with diabetic chronic kidney disease: Secondary | ICD-10-CM

## 2023-06-29 DIAGNOSIS — M79674 Pain in right toe(s): Secondary | ICD-10-CM

## 2023-07-04 NOTE — Progress Notes (Signed)
  Subjective:  Patient ID: Stacy Walls, female    DOB: 12/08/1932,  MRN: [id number]  Stacy Walls presents to clinic today for at risk foot care. Pt has h/o NIDDM with chronic kidney disease and painful, elongated thickened toenails x 10 which are symptomatic when wearing enclosed shoe gear. This interferes with his/her daily activities.  Chief Complaint  Patient presents with   [facility]    She is her for her nail trim, PCP is [facility] is [facility] centre manager but it will change sometime and seen every 3 months ,   New problem(s): None.   PCP is Karenann Cai, NP.  Allergies  Allergen Reactions   Ace Inhibitors Cough   Azithromycin Other (See Comments)    Unknown   Pioglitazone Other (See Comments)    REACTION: edema   Sulfonamide Derivatives Rash   Tramadol Other (See Comments)    dizziness   Amlodipine Other (See Comments)    Unfavorable reaction/headache    Review of Systems: Negative except as noted in the HPI.  Objective: No changes noted in today's physical examination. There were no vitals filed for this visit. Stacy Walls is a pleasant 89 y.o. female in NAD. AAO x 3.  Vascular Examination: Capillary refill time immediate b/l. Vascular status intact b/l with palpable DP pulses; faintly palpable PT pulses. Pedal hair diminished b/l.  No pain with calf compression b/l. Skin temperature gradient WNL b/l. No cyanosis or clubbing noted b/l. No ischemia or gangrene b/l. Nonpitting edema noted BLE.  Neurological Examination: Sensation grossly intact b/l with 10 gram monofilament. Vibratory sensation intact b/l.   Dermatological Examination: Pedal skin with normal turgor, texture and tone b/l.  No open wounds. No interdigital macerations.   Toenails 1-5 b/l thick, discolored, elongated with subungual debris and pain on dorsal palpation.   No corns, calluses nor porokeratotic lesions noted.  Musculoskeletal Examination: Muscle  strength 5/5 to all lower extremity muscle groups bilaterally. No pain, crepitus or joint limitation noted with ROM bilateral LE. Pes planus deformity noted bilateral LE. Utilizes cane for ambulation assistance.  Radiographs: None  Assessment/Plan: 1. Pain due to onychomycosis of toenails of both feet   2. Type 2 diabetes mellitus with stage 3b chronic kidney disease, without long-term current use of insulin (HCC)     Patient was evaluated and treated. All patient's and/or POA's questions/concerns addressed on today's visit. Toenails 1-5 debrided in length and girth without incident. Continue foot and shoe inspections daily. Monitor blood glucose per PCP/Endocrinologist's recommendations. Continue soft, supportive shoe gear daily. Report any pedal injuries to medical professional. Call office if there are any questions/concerns. -Patient/POA to call should there be question/concern in the interim.   Return in about 3 months (around 09/29/2023).  Freddie Breech, DPM      [place] LOCATION: [address] [place] [address]                   Office [phone]   [facility] LOCATION: [address] [place], [place] [address] Office [phone]

## 2023-07-27 ENCOUNTER — Other Ambulatory Visit: Payer: Self-pay | Admitting: Cardiology

## 2023-08-16 ENCOUNTER — Other Ambulatory Visit: Payer: Self-pay | Admitting: Registered Nurse

## 2023-08-16 DIAGNOSIS — K7460 Unspecified cirrhosis of liver: Secondary | ICD-10-CM

## 2023-08-17 ENCOUNTER — Encounter: Payer: Self-pay | Admitting: Registered Nurse

## 2023-08-17 ENCOUNTER — Other Ambulatory Visit: Payer: Self-pay | Admitting: Registered Nurse

## 2023-08-17 ENCOUNTER — Encounter (HOSPITAL_COMMUNITY): Payer: Self-pay | Admitting: Nephrology

## 2023-08-17 DIAGNOSIS — R519 Headache, unspecified: Secondary | ICD-10-CM

## 2023-08-17 DIAGNOSIS — M542 Cervicalgia: Secondary | ICD-10-CM

## 2023-08-23 ENCOUNTER — Ambulatory Visit
Admission: RE | Admit: 2023-08-23 | Discharge: 2023-08-23 | Disposition: A | Discharge status: Home / Self Care | Source: Ambulatory Visit | Attending: Registered Nurse

## 2023-08-23 ENCOUNTER — Ambulatory Visit
Admission: RE | Admit: 2023-08-23 | Discharge: 2023-08-23 | Disposition: A | Discharge status: Home / Self Care | Source: Ambulatory Visit | Attending: Registered Nurse | Admitting: Registered Nurse

## 2023-08-23 DIAGNOSIS — R519 Headache, unspecified: Secondary | ICD-10-CM

## 2023-08-23 DIAGNOSIS — M542 Cervicalgia: Secondary | ICD-10-CM

## 2023-08-24 ENCOUNTER — Encounter (HOSPITAL_COMMUNITY): Payer: Self-pay | Admitting: Nephrology

## 2023-08-24 ENCOUNTER — Ambulatory Visit
Admission: RE | Admit: 2023-08-24 | Discharge: 2023-08-24 | Disposition: A | Discharge status: Home / Self Care | Source: Ambulatory Visit | Attending: Registered Nurse | Admitting: Registered Nurse

## 2023-08-24 DIAGNOSIS — K7460 Unspecified cirrhosis of liver: Secondary | ICD-10-CM

## 2023-09-29 ENCOUNTER — Ambulatory Visit (INDEPENDENT_AMBULATORY_CARE_PROVIDER_SITE_OTHER): Payer: Medicare PPO | Admitting: Podiatry

## 2023-09-29 ENCOUNTER — Encounter: Payer: Self-pay | Admitting: Podiatry

## 2023-09-29 DIAGNOSIS — M79675 Pain in left toe(s): Secondary | ICD-10-CM

## 2023-09-29 DIAGNOSIS — N1832 Chronic kidney disease, stage 3b: Secondary | ICD-10-CM

## 2023-09-29 DIAGNOSIS — B351 Tinea unguium: Secondary | ICD-10-CM

## 2023-09-29 DIAGNOSIS — E1122 Type 2 diabetes mellitus with diabetic chronic kidney disease: Secondary | ICD-10-CM | POA: Diagnosis not present

## 2023-09-29 DIAGNOSIS — M79674 Pain in right toe(s): Secondary | ICD-10-CM | POA: Diagnosis not present

## 2023-10-05 NOTE — Progress Notes (Signed)
 ANNUAL DIABETIC FOOT EXAM  Subjective: Stacy Walls presents today for annual diabetic foot exam. Chief Complaint  Patient presents with   [facility]    Rm16/ Diabetic/Blood sugar 97/Last pcp visit April 2025   Patient confirms h/o diabetes.  Patient denies any h/o foot wounds.  Stacy Ficks, NP is patient's PCP.  Past Medical History:  Diagnosis Date   Acute cholecystitis    Allergic rhinitis    Anemia    Arthritis    Cancer (HCC)    Right breast   Chest pain    Chronic renal insufficiency    followed by Dr Arlis Lakes stage 3   Clostridium difficile colitis    Constipation    Coronary atherosclerosis of native coronary vessel    Cough    Depression    situational - husband died May 13, 2018   Diabetes mellitus    Dizziness    DM2 (diabetes mellitus, type 2) (HCC)    Dyshidrosis    Family history of adverse reaction to anesthesia    daughter - nausea   GERD (gastroesophageal reflux disease)    Gout    HLD (hyperlipidemia)    HTN (hypertension)    Hypothyroidism    Neck mass    Neck pain    Osteoporosis    Personal history of radiation therapy 12/2018   Routine general medical examination at a health care facility    Secondary hyperparathyroidism (of renal origin)    Urinary incontinence    Urinary tract infection    Patient Active Problem List   Diagnosis Date Noted   Pain in right hand 11/19/2021   Chronic diastolic heart failure (HCC) 01/30/2021   Other secondary pulmonary hypertension (HCC) 01/30/2021   Debility 03/27/2019   Morbid obesity (HCC) 03/27/2019   Hyponatremia 03/18/2019   Acute renal failure superimposed on stage 3 chronic kidney disease (HCC) 03/18/2019   Cancer of central portion of right female breast (HCC) 04/08/2018   Cough 10/11/2017   Abnormal endometrial ultrasound 08/03/2017   Chronic scapular pain 08/02/2017   Pelvic pain 07/27/2017   Headache 05/05/2017   Osteoarthritis 07/29/2016   UTI (urinary tract infection)  09/04/2015   Acute sinus infection 07/31/2015   Screening for breast cancer 01/31/2013   Diabetes mellitus with renal manifestation (HCC) 08/24/2012   Iron  deficiency anemia, unspecified 01/27/2012   Screening examination for infectious disease 01/27/2012   Encounter for long-term (current) use of other medications 01/12/2011   OTITIS EXTERNA 03/04/2010   Shoulder pain, left 12/02/2009   Pancytopenia (HCC) 10/09/2009   CLOSTRIDIUM DIFFICILE COLITIS 08/23/2009   CAD (coronary artery disease) 08/23/2009   CKD (chronic kidney disease), stage III (HCC) 08/23/2009   Chest pain of uncertain etiology 08/23/2009   Acute cholecystitis 08/15/2009   Dyshidrosis 04/08/2009   DIZZINESS 07/03/2008   Secondary renal hyperparathyroidism (HCC) 06/15/2007   Hypothyroidism 04/20/2007   NECK PAIN 04/20/2007   NECK MASS 04/05/2007   GERD 03/09/2007   Dyslipidemia 09/20/2006   GOUT 09/20/2006   Essential hypertension 09/20/2006   ALLERGIC RHINITIS 09/20/2006   Osteoporosis 09/20/2006   URINARY INCONTINENCE 09/20/2006   Past Surgical History:  Procedure Laterality Date   bone density  08/21/05   BREAST BIOPSY Right 03/30/2018   x3   BREAST BIOPSY Right 04/25/2018   BREAST LUMPECTOMY Right 06/07/2018   BREAST LUMPECTOMY WITH RADIOACTIVE SEED AND AXILLARY LYMPH NODE DISSECTION Right 06/07/2018   Procedure: RIGHT BREAST LUMPECTOMY WITH BRACKETED RADIOACTIVE SEEDS AND RIGHT COMPLETE AXILLARY LYMPH NODE DISSECTION;  Surgeon:  Boyce Byes, MD;  Location: [facility] OR;  Service: General;  Laterality: Right;   C-section (other)  1977   CHOLECYSTECTOMY     ELECTROCARDIOGRAM  02/01/06   L ankle surgery Left 1988   total left knee Left 2007   Current Outpatient Medications on File Prior to Visit  Medication Sig Dispense Refill   ACCU-CHEK GUIDE TEST test strip daily.     acetaminophen  (TYLENOL ) 325 MG tablet Take 1-2 tablets (325-650 mg total) by mouth every 4 (four) hours as needed for mild pain.      allopurinol  (ZYLOPRIM ) 100 MG tablet Take 100 mg by mouth daily.     amoxicillin  (AMOXIL ) 250 MG capsule Take 250 mg by mouth every 12 (twelve) hours.     aspirin  EC 81 MG tablet Take 81 mg by mouth daily.     atorvastatin  (LIPITOR) 40 MG tablet Take 1 tablet (40 mg total) by mouth daily. 90 tablet 1   azithromycin (ZITHROMAX) 250 MG tablet Take by mouth.     Carboxymeth-Glyc-Polysorb PF (REFRESH OPTIVE MEGA-3) 0.5-1-0.5 % SOLN Apply 1 drop to eye daily.     dapagliflozin propanediol (FARXIGA) 10 MG TABS tablet Take 10 mg by mouth daily.     famotidine (PEPCID) 20 MG tablet Take 20 mg by mouth 2 (two) times daily.     Ferrous Sulfate  (IRON ) 325 (65 Fe) MG TABS Take 1 tablet (325 mg total) by mouth daily. 30 tablet 0   fluticasone  (FLONASE ) 50 MCG/ACT nasal spray Place 1 spray into both nostrils daily as needed. 9.9 mL 3   furosemide  (LASIX ) 40 MG tablet TAKE 1 TABLET(40 MG) BY MOUTH DAILY 90 tablet 3   glipiZIDE (GLUCOTROL) 5 MG tablet Take 5 mg by mouth every morning.     hydrALAZINE  (APRESOLINE ) 50 MG tablet TAKE 1 AND 1/2 TABLETS(75 MG) BY MOUTH TWICE DAILY 270 tablet 2   levocetirizine (XYZAL) 5 MG tablet Take 5 mg by mouth every evening.     levothyroxine  (SYNTHROID ) 88 MCG tablet Take 88 mcg by mouth daily.     liothyronine  (CYTOMEL ) 5 MCG tablet Take 5 mcg by mouth daily.     metoprolol  tartrate (LOPRESSOR ) 25 MG tablet Take 0.5 tablets (12.5 mg total) by mouth 2 (two) times daily. 90 tablet 3   Multiple Vitamins-Minerals (MULTIVITAMIN WOMEN 50+ PO) Take 1 tablet by mouth daily.      nitroGLYCERIN  (NITROSTAT ) 0.4 MG SL tablet PLACE 1 TABLET(0.4 MG TOTAL) UNDER THE TONGUE EVERY 5(FIVE) MINUTES AS NEEDED FOR CHEST PAIN. 25 tablet 8   senna (SENOKOT) 8.6 MG tablet Take 2 tablets by mouth daily.     tamoxifen  (NOLVADEX ) 20 MG tablet Take 1 tablet (20 mg total) by mouth daily. 90 tablet 3   No current facility-administered medications on file prior to visit.    Allergies  Allergen  Reactions   Ace Inhibitors Cough   Azithromycin Other (See Comments)    Unknown   Pioglitazone Other (See Comments)    REACTION: edema   Sulfonamide Derivatives Rash   Tramadol Other (See Comments)    dizziness   Amlodipine  Other (See Comments)    Unfavorable reaction/headache   Social History   Occupational History   Occupation: retired   Tobacco Use   Smoking status: Never   Smokeless tobacco: Never  Vaping Use   Vaping status: Never Used  Substance and Sexual Activity   Alcohol  use: No   Drug use: No   Sexual activity: Not on file   Family  History  Problem Relation Age of Onset   Coronary artery disease Father    Stroke Father    Diabetes Father    Diabetes Sister    Stroke Sister    Diabetes Brother    Cancer Brother        liver caner   Breast cancer Neg Hx    Immunization History  Administered Date(s) Administered   Hepatitis B 02/11/2012, 03/24/2012   Influenza Split 01/27/2012   Influenza Whole 02/01/2008, 04/08/2009, 01/13/2010   Influenza, High Dose Seasonal PF 01/15/2016, 01/13/2017   Influenza,inj,Quad PF,6+ Mos 01/13/2013   Influenza-Unspecified 02/18/2014   Pneumococcal Conjugate-13 03/07/2014   Pneumococcal Polysaccharide-23 04/21/1994, 04/24/2016   Td 04/21/1994   Tdap 05/05/2017   Zoster, Live 03/07/2014     Review of Systems: Negative except as noted in the HPI.   Objective: There were no vitals filed for this visit.  Stacy Walls is a pleasant 89 y.o. female in NAD. AAO X 3.  Diabetic foot exam was performed with the following findings:   Vascular Examination: Capillary refill time immediate b/l. Palpable DP pulses. Faintly palpable PT pulse(s) b/l LE. Pedal hair absent. No pain with calf compression b/l. Nonpitting edema noted BLE. No cyanosis or clubbing noted b/l LE.  Neurological Examination: Sensation grossly intact b/l with 10 gram monofilament. Vibratory sensation intact b/l.   Dermatological Examination: Pedal  skin with normal turgor, texture and tone b/l.  No open wounds. No interdigital macerations.   Toenails 1-5 b/l thick, discolored, elongated with subungual debris and pain on dorsal palpation.   No hyperkeratotic nor porokeratotic lesions present on today's visit.  Musculoskeletal Examination: Muscle strength 5/5 to all lower extremity muscle groups bilaterally. No pain, crepitus or joint limitation noted with ROM b/l LE. Pes planus deformity noted bilateral LE.Stacy Walls Patient ambulates with cane assistance.  Radiographs: None     Lab Results  Component Value Date   HGBA1C 5.2 03/18/2019   ADA Risk Categorization: Low Risk :  Patient has all of the following: Intact protective sensation No prior foot ulcer  No severe deformity Pedal pulses present  Assessment: 1. Pain due to onychomycosis of toenails of both feet   2. Type 2 diabetes mellitus with stage 3b chronic kidney disease, without long-term current use of insulin  (HCC)      Plan: Consent given for treatment. Patient examined. All patient's and/or POA's questions/concerns addressed on today's visit. Mycotic toenails 1-5 debrided in length and girth without incident. Continue foot and shoe inspections daily. Monitor blood glucose per PCP/Endocrinologist's recommendations.Continue soft, supportive shoe gear daily. Report any pedal injuries to medical professional. Call office if there are any quesitons/concerns. -Patient/POA to call should there be question/concern in the interim. Return in about 3 months (around 12/30/2023).  Stacy Walls, DPM      [place] LOCATION: [address] [place] [address]                   Office [phone]   [facility] LOCATION: [address] [place], [place] [address] Office [phone]

## 2023-10-11 ENCOUNTER — Encounter (HOSPITAL_COMMUNITY): Payer: Self-pay | Admitting: Nephrology

## 2023-10-25 ENCOUNTER — Emergency Department (HOSPITAL_BASED_OUTPATIENT_CLINIC_OR_DEPARTMENT_OTHER)
Admission: EM | Admit: 2023-10-25 | Discharge: 2023-10-25 | Disposition: A | Discharge status: Home / Self Care | Attending: Student | Admitting: Student

## 2023-10-25 ENCOUNTER — Other Ambulatory Visit: Payer: Self-pay

## 2023-10-25 ENCOUNTER — Emergency Department (HOSPITAL_BASED_OUTPATIENT_CLINIC_OR_DEPARTMENT_OTHER): Admitting: Radiology

## 2023-10-25 DIAGNOSIS — N183 Chronic kidney disease, stage 3 unspecified: Secondary | ICD-10-CM | POA: Diagnosis not present

## 2023-10-25 DIAGNOSIS — I130 Hypertensive heart and chronic kidney disease with heart failure and stage 1 through stage 4 chronic kidney disease, or unspecified chronic kidney disease: Secondary | ICD-10-CM | POA: Insufficient documentation

## 2023-10-25 DIAGNOSIS — M25561 Pain in right knee: Secondary | ICD-10-CM | POA: Insufficient documentation

## 2023-10-25 DIAGNOSIS — Z96652 Presence of left artificial knee joint: Secondary | ICD-10-CM | POA: Diagnosis not present

## 2023-10-25 DIAGNOSIS — Z7982 Long term (current) use of aspirin: Secondary | ICD-10-CM | POA: Diagnosis not present

## 2023-10-25 DIAGNOSIS — Z79899 Other long term (current) drug therapy: Secondary | ICD-10-CM | POA: Insufficient documentation

## 2023-10-25 DIAGNOSIS — E039 Hypothyroidism, unspecified: Secondary | ICD-10-CM | POA: Insufficient documentation

## 2023-10-25 DIAGNOSIS — Z7984 Long term (current) use of oral hypoglycemic drugs: Secondary | ICD-10-CM | POA: Diagnosis not present

## 2023-10-25 DIAGNOSIS — Z853 Personal history of malignant neoplasm of breast: Secondary | ICD-10-CM | POA: Insufficient documentation

## 2023-10-25 DIAGNOSIS — I5032 Chronic diastolic (congestive) heart failure: Secondary | ICD-10-CM | POA: Insufficient documentation

## 2023-10-25 DIAGNOSIS — I2510 Atherosclerotic heart disease of native coronary artery without angina pectoris: Secondary | ICD-10-CM | POA: Insufficient documentation

## 2023-10-25 DIAGNOSIS — E1122 Type 2 diabetes mellitus with diabetic chronic kidney disease: Secondary | ICD-10-CM | POA: Diagnosis not present

## 2023-10-25 MED ORDER — ACETAMINOPHEN 500 MG PO TABS
1000.0000 mg | ORAL_TABLET | Freq: Once | ORAL | Status: AC
  Administered 2023-10-25: 1000 mg via ORAL
  Filled 2023-10-25: qty 2

## 2023-10-25 MED ORDER — KETOROLAC TROMETHAMINE 15 MG/ML IJ SOLN
15.0000 mg | Freq: Once | INTRAMUSCULAR | Status: AC
  Administered 2023-10-25: 15 mg via INTRAMUSCULAR
  Filled 2023-10-25: qty 1

## 2023-10-25 NOTE — ED Triage Notes (Signed)
 C/o R knee pain. No injury. States she gets steroid injection every 3 months but pcp is out of town. States can not wait that long.

## 2023-10-26 NOTE — ED Provider Notes (Signed)
 [place] EMERGENCY DEPARTMENT AT [facility] Provider Note  CSN: [id number] Arrival date & time: 10/25/23 1625  Chief Complaint(s) Knee Pain  HPI Stacy Walls is a 89 y.o. female with PMH osteoarthritis not an appropriate surgical candidate, C. difficile colitis, GERD, gout, HTN, HLD, CKD who presents emergency room for evaluation of right knee pain.  She states she is using Tylenol  for pain control definitely.  She states that she normally gets steroid injections every 3 months but was feeling good and did not get her most recent steroid injection.  However, her knee pain has not worsened and she presents Emergency Department for further evaluation.  She states that her primary care physician was unable to see her today.  She denies numbness, ting, weakness of lower extremity.  Denies fever, chest pain, shortness of breath or other systemic symptoms.   Past Medical History Past Medical History:  Diagnosis Date   Acute cholecystitis    Allergic rhinitis    Anemia    Arthritis    Cancer (HCC)    Right breast   Chest pain    Chronic renal insufficiency    followed by Dr Douglass stage 3   Clostridium difficile colitis    Constipation    Coronary atherosclerosis of native coronary vessel    Cough    Depression    situational - husband died 22-May-2018   Diabetes mellitus    Dizziness    DM2 (diabetes mellitus, type 2) (HCC)    Dyshidrosis    Family history of adverse reaction to anesthesia    daughter - nausea   GERD (gastroesophageal reflux disease)    Gout    HLD (hyperlipidemia)    HTN (hypertension)    Hypothyroidism    Neck mass    Neck pain    Osteoporosis    Personal history of radiation therapy 12/2018   Routine general medical examination at a health care facility    Secondary hyperparathyroidism (of renal origin)    Urinary incontinence    Urinary tract infection    Patient Active Problem List   Diagnosis Date Noted   Pain in right hand  11/19/2021   Chronic diastolic heart failure (HCC) 01/30/2021   Other secondary pulmonary hypertension (HCC) 01/30/2021   Debility 03/27/2019   Morbid obesity (HCC) 03/27/2019   Hyponatremia 03/18/2019   Acute renal failure superimposed on stage 3 chronic kidney disease (HCC) 03/18/2019   Cancer of central portion of right female breast (HCC) 04/08/2018   Cough 10/11/2017   Abnormal endometrial ultrasound 08/03/2017   Chronic scapular pain 08/02/2017   Pelvic pain 07/27/2017   Headache 05/05/2017   Osteoarthritis 07/29/2016   UTI (urinary tract infection) 09/04/2015   Acute sinus infection 07/31/2015   Screening for breast cancer 01/31/2013   Diabetes mellitus with renal manifestation (HCC) 08/24/2012   Iron  deficiency anemia, unspecified 01/27/2012   Screening examination for infectious disease 01/27/2012   Encounter for long-term (current) use of other medications 01/12/2011   OTITIS EXTERNA 03/04/2010   Shoulder pain, left 12/02/2009   Pancytopenia (HCC) 10/09/2009   CLOSTRIDIUM DIFFICILE COLITIS 08/23/2009   CAD (coronary artery disease) 08/23/2009   CKD (chronic kidney disease), stage III (HCC) 08/23/2009   Chest pain of uncertain etiology 08/23/2009   Acute cholecystitis 08/15/2009   Dyshidrosis 04/08/2009   DIZZINESS 07/03/2008   Secondary renal hyperparathyroidism (HCC) 06/15/2007   Hypothyroidism 04/20/2007   NECK PAIN 04/20/2007   NECK MASS 04/05/2007   GERD 03/09/2007   Dyslipidemia 09/20/2006  GOUT 09/20/2006   Essential hypertension 09/20/2006   ALLERGIC RHINITIS 09/20/2006   Osteoporosis 09/20/2006   URINARY INCONTINENCE 09/20/2006   Home Medication(s) Prior to Admission medications   Medication Sig Start Date End Date Taking? Authorizing Provider  ACCU-CHEK GUIDE TEST test strip daily. 09/02/23   [provider]  acetaminophen  (TYLENOL ) 325 MG tablet Take 1-2 tablets (325-650 mg total) by mouth every 4 (four) hours as needed for mild pain.  04/03/19   Angiulli, Toribio PARAS, PA-C  allopurinol  (ZYLOPRIM ) 100 MG tablet Take 100 mg by mouth daily. 05/10/19   [provider]  amoxicillin  (AMOXIL ) 250 MG capsule Take 250 mg by mouth every 12 (twelve) hours. 03/03/23   [provider]  aspirin  EC 81 MG tablet Take 81 mg by mouth daily.    [provider]  atorvastatin  (LIPITOR) 40 MG tablet Take 1 tablet (40 mg total) by mouth daily. 10/26/19   Lelon Hamilton T, PA-C  azithromycin (ZITHROMAX) 250 MG tablet Take by mouth. 08/16/23   [provider]  Carboxymeth-Glyc-Polysorb PF (REFRESH OPTIVE MEGA-3) 0.5-1-0.5 % SOLN Apply 1 drop to eye daily.    [provider]  dapagliflozin propanediol (FARXIGA) 10 MG TABS tablet Take 10 mg by mouth daily.    [provider]  famotidine (PEPCID) 20 MG tablet Take 20 mg by mouth 2 (two) times daily. 09/12/21   [provider]  Ferrous Sulfate  (IRON ) 325 (65 Fe) MG TABS Take 1 tablet (325 mg total) by mouth daily. 04/03/19   Angiulli, Daniel J, PA-C  fluticasone  (FLONASE ) 50 MCG/ACT nasal spray Place 1 spray into both nostrils daily as needed. 03/09/23   Burton, Lacie K, NP  furosemide  (LASIX ) 40 MG tablet TAKE 1 TABLET(40 MG) BY MOUTH DAILY 02/22/23   Jeffrie Oneil BROCKS, MD  glipiZIDE (GLUCOTROL) 5 MG tablet Take 5 mg by mouth every morning. 12/28/22   [provider]  hydrALAZINE  (APRESOLINE ) 50 MG tablet TAKE 1 AND 1/2 TABLETS(75 MG) BY MOUTH TWICE DAILY 07/27/23   Jeffrie Oneil BROCKS, MD  levocetirizine (XYZAL) 5 MG tablet Take 5 mg by mouth every evening.    [provider]  levothyroxine  (SYNTHROID ) 88 MCG tablet Take 88 mcg by mouth daily. 12/18/19   [provider]  liothyronine  (CYTOMEL ) 5 MCG tablet Take 5 mcg by mouth daily. 03/01/22   [provider]  metoprolol  tartrate (LOPRESSOR ) 25 MG tablet Take 0.5 tablets (12.5 mg total) by mouth 2 (two) times daily. 02/08/20   McDaniel, Jill D, NP  Multiple Vitamins-Minerals  (MULTIVITAMIN WOMEN 50+ PO) Take 1 tablet by mouth daily.     [provider]  nitroGLYCERIN  (NITROSTAT ) 0.4 MG SL tablet PLACE 1 TABLET(0.4 MG TOTAL) UNDER THE TONGUE EVERY 5(FIVE) MINUTES AS NEEDED FOR CHEST PAIN. 06/16/21   Jeffrie Oneil BROCKS, MD  senna (SENOKOT) 8.6 MG tablet Take 2 tablets by mouth daily.    [provider]  tamoxifen  (NOLVADEX ) 20 MG tablet Take 1 tablet (20 mg total) by mouth daily. 03/09/23   Burton, Lacie K, NP  Past Surgical History Past Surgical History:  Procedure Laterality Date   bone density  08/21/05   BREAST BIOPSY Right 03/30/2018   x3   BREAST BIOPSY Right 04/25/2018   BREAST LUMPECTOMY Right 06/07/2018   BREAST LUMPECTOMY WITH RADIOACTIVE SEED AND AXILLARY LYMPH NODE DISSECTION Right 06/07/2018   Procedure: RIGHT BREAST LUMPECTOMY WITH BRACKETED RADIOACTIVE SEEDS AND RIGHT COMPLETE AXILLARY LYMPH NODE DISSECTION;  Surgeon: Gail Favorite, MD;  Location: [facility] OR;  Service: General;  Laterality: Right;   C-section (other)  1977   CHOLECYSTECTOMY     ELECTROCARDIOGRAM  02/01/06   L ankle surgery Left 1988   total left knee Left 2007   Family History Family History  Problem Relation Age of Onset   Coronary artery disease Father    Stroke Father    Diabetes Father    Diabetes Sister    Stroke Sister    Diabetes Brother    Cancer Brother        liver caner   Breast cancer Neg Hx     Social History Social History   Tobacco Use   Smoking status: Never   Smokeless tobacco: Never  Vaping Use   Vaping status: Never Used  Substance Use Topics   Alcohol  use: No   Drug use: No   Allergies Ace inhibitors, Azithromycin, Pioglitazone, Sulfonamide derivatives, Tramadol, and Amlodipine   Review of Systems Review of Systems  Musculoskeletal:  Positive for arthralgias and joint swelling.    Physical Exam Vital  Signs  I have reviewed the triage vital signs BP (!) 141/77 (BP Location: Left Arm)   Pulse 87   Temp 98.7 F (37.1 C)   Resp 18   SpO2 99%   Physical Exam Vitals and nursing note reviewed.  Constitutional:      General: She is not in acute distress.    Appearance: She is well-developed.  HENT:     Head: Normocephalic and atraumatic.  Eyes:     Conjunctiva/sclera: Conjunctivae normal.  Cardiovascular:     Rate and Rhythm: Normal rate and regular rhythm.     Heart sounds: No murmur heard. Pulmonary:     Effort: Pulmonary effort is normal. No respiratory distress.     Breath sounds: Normal breath sounds.  Abdominal:     Palpations: Abdomen is soft.     Tenderness: There is no abdominal tenderness.  Musculoskeletal:        General: Swelling and tenderness present.     Cervical back: Neck supple.  Skin:    General: Skin is warm and dry.     Capillary Refill: Capillary refill takes less than 2 seconds.  Neurological:     Mental Status: She is alert.  Psychiatric:        Mood and Affect: Mood normal.     ED Results and Treatments Labs (all labs ordered are listed, but only abnormal results are displayed) Labs Reviewed - No data to display  Radiology DG Knee 2 Views Right Result Date: 10/25/2023 CLINICAL DATA:  Right-sided knee pain EXAM: RIGHT KNEE - 1-2 VIEW COMPARISON:  None Available. FINDINGS: Mild lateral subluxation of the tibia with respect to the distal femur. Severe tricompartment arthritis of the knee, worse involving the medial and patellofemoral joint spaces, bone on bone contact medially. Subarticular sclerosis and cyst formation. Moderate knee effusion. Vascular calcifications. Chondrocalcinosis IMPRESSION: Severe tricompartment arthritis of the knee with moderate knee effusion. Electronically Signed   By: Luke Bun M.D.   On: 10/25/2023 18:17     Pertinent labs & imaging results that were available during my care of the patient were reviewed by me and considered in my medical decision making (see MDM for details).  Medications Ordered in ED Medications  ketorolac  (TORADOL ) 15 MG/ML injection 15 mg (15 mg Intramuscular Given 10/25/23 1955)  acetaminophen  (TYLENOL ) tablet 1,000 mg (1,000 mg Oral Given 10/25/23 1954)                                                                                                                                     Procedures Procedures  (including critical care time)  Medical Decision Making / ED Course   This patient presents to the ED for concern of knee pain, this involves an extensive number of treatment options, and is a complaint that carries with it a high risk of complications and morbidity.  The differential diagnosis includes osteoarthritis, gout, pseudogout, septic joint, fracture  MDM: Patient seen emergency room for evaluation of knee pain.  Physical exam with a moderate-sized effusion but patient has full range of motion and no overlying erythema.  X-ray showing severe osteoarthritis.  I explained that unfortunately I am unable to perform injection the knee in the emergency department and this will need to be done in the outpatient setting.  Patient otherwise pain controlled here in the emergency department and on reevaluation symptoms improved.  Very low suspicion for septic joint given full range of motion of the knee.  At this time she does not meet inpatient criteria for admission and will be discharged with outpatient follow-up.  Return precautions given which she voiced understanding   Additional history obtained: -Additional history obtained from multiple family members -External records from outside source obtained and reviewed including: Chart review including previous notes, labs, imaging, consultation notes   Imaging Studies ordered: I ordered imaging studies including knee  x-ray I independently visualized and interpreted imaging. I agree with the radiologist interpretation   Medicines ordered and prescription drug management: Meds ordered this encounter  Medications   ketorolac  (TORADOL ) 15 MG/ML injection 15 mg   acetaminophen  (TYLENOL ) tablet 1,000 mg    -I have reviewed the patients home medicines and have made adjustments as needed  Critical interventions none  Social Determinants of Health:  Factors impacting patients care include: none   Reevaluation: After the interventions noted above, I reevaluated the patient and  found that they have :improved  Co morbidities that complicate the patient evaluation  Past Medical History:  Diagnosis Date   Acute cholecystitis    Allergic rhinitis    Anemia    Arthritis    Cancer (HCC)    Right breast   Chest pain    Chronic renal insufficiency    followed by Dr Douglass stage 3   Clostridium difficile colitis    Constipation    Coronary atherosclerosis of native coronary vessel    Cough    Depression    situational - husband died 24-May-2018   Diabetes mellitus    Dizziness    DM2 (diabetes mellitus, type 2) (HCC)    Dyshidrosis    Family history of adverse reaction to anesthesia    daughter - nausea   GERD (gastroesophageal reflux disease)    Gout    HLD (hyperlipidemia)    HTN (hypertension)    Hypothyroidism    Neck mass    Neck pain    Osteoporosis    Personal history of radiation therapy 12/2018   Routine general medical examination at a health care facility    Secondary hyperparathyroidism (of renal origin)    Urinary incontinence    Urinary tract infection       Dispostion: I considered admission for this patient, but at this time she does not meet inpatient criteria for admission and she will be discharged with outpatient follow-up.     Final Clinical Impression(s) / ED Diagnoses Final diagnoses:  Acute pain of right knee     @PCDICTATION @    Albertina Dixon,  MD 10/26/23 [phone]

## 2023-11-03 ENCOUNTER — Other Ambulatory Visit: Payer: Medicare PPO

## 2023-11-11 ENCOUNTER — Ambulatory Visit (HOSPITAL_BASED_OUTPATIENT_CLINIC_OR_DEPARTMENT_OTHER)
Admission: RE | Admit: 2023-11-11 | Discharge: 2023-11-11 | Disposition: A | Discharge status: Home / Self Care | Source: Ambulatory Visit | Attending: Nurse Practitioner | Admitting: Nurse Practitioner

## 2023-11-11 DIAGNOSIS — Z13820 Encounter for screening for osteoporosis: Secondary | ICD-10-CM | POA: Diagnosis present

## 2023-11-11 DIAGNOSIS — C50111 Malignant neoplasm of central portion of right female breast: Secondary | ICD-10-CM | POA: Diagnosis present

## 2023-11-11 DIAGNOSIS — M810 Age-related osteoporosis without current pathological fracture: Secondary | ICD-10-CM | POA: Insufficient documentation

## 2023-11-11 DIAGNOSIS — Z170 Estrogen receptor positive status [ER+]: Secondary | ICD-10-CM | POA: Insufficient documentation

## 2023-11-11 DIAGNOSIS — Z79810 Long term (current) use of selective estrogen receptor modulators (SERMs): Secondary | ICD-10-CM | POA: Diagnosis not present

## 2023-11-12 ENCOUNTER — Ambulatory Visit: Payer: Self-pay | Admitting: Nurse Practitioner

## 2023-11-12 ENCOUNTER — Telehealth: Payer: Self-pay | Admitting: Nurse Practitioner

## 2023-11-12 NOTE — Telephone Encounter (Signed)
 Scheduled appointment per 7/25 secure chat. Talked with the patient and she is aware of the made appointment.

## 2023-11-18 ENCOUNTER — Other Ambulatory Visit: Payer: Self-pay | Admitting: Cardiology

## 2023-11-22 ENCOUNTER — Inpatient Hospital Stay: Attending: Nurse Practitioner | Admitting: Nurse Practitioner

## 2023-11-22 ENCOUNTER — Encounter: Payer: Self-pay | Admitting: Nurse Practitioner

## 2023-11-22 DIAGNOSIS — Z170 Estrogen receptor positive status [ER+]: Secondary | ICD-10-CM

## 2023-11-22 DIAGNOSIS — C50111 Malignant neoplasm of central portion of right female breast: Secondary | ICD-10-CM

## 2023-11-22 DIAGNOSIS — M810 Age-related osteoporosis without current pathological fracture: Secondary | ICD-10-CM | POA: Diagnosis not present

## 2023-11-22 NOTE — Progress Notes (Signed)
 [facility]   [phone] [phone]    Patient Care Team: Sharyne Harlene CROME, NP as PCP - General (Nurse Practitioner) Jeffrie Oneil BROCKS, MD as PCP - Cardiology (Cardiology) Douglass Lunger, MD (Nephrology) Deward Specking, MD (Orthopedic Surgery) Austin Ned, MD as Consulting Physician (Obstetrics and Gynecology) Lanny Callander, MD as Consulting Physician (Hematology) Gail Favorite, MD as Consulting Physician (General Surgery) Shaana Acocella K, NP as Nurse Practitioner (Nurse Practitioner) Izell Domino, MD as Attending Physician (Radiation Oncology)   I connected with [name] on 11/22/23 at 11:00 AM EDT by telephone visit and verified that I am speaking with the correct person using two identifiers.   I discussed the limitations, risks, security and privacy concerns of performing an evaluation and management service by telemedicine and the availability of in-person appointments. I also discussed with the patient that there may be a patient responsible charge related to this service. The patient expressed understanding and agreed to proceed.   Other persons participating in the visit and their role in the encounter: None  Patient's location: Home  Provider's location: Home office    Chief Complaint: Review DEXA results    Oncology History Overview Note  Cancer Staging Cancer of central portion of right female breast [facility]) Staging form: Breast, AJCC 8th Edition - Clinical stage from 03/30/2018: Stage IB (cT1c(m), cN1, cM0, G2, ER+, PR+, HER2-) - Signed by Lanny Callander, MD on 04/08/2018 - Pathologic stage from 06/07/2018: Stage IB (pT2(m), pN2a, cM0, G2, ER+, PR+, HER2-) - Signed by Crawford Morna Pickle, NP on 06/22/2018     Cancer of central portion of right female breast (HCC)  01/13/2018 Mammogram   Diagnostic Mammogram 01/13/18  RECOMMENDATION: 1. Ultrasound-guided biopsies of both masses in the RIGHT breast identified by  ultrasound at the 12 o'clock axis (7 cm from the nipple measuring 1.1 x 0.8 x 0.7 cm and 5 cm from the nipple measuring 1 x 0.8 x 0.9 cm respectively). 2. Ultrasound-guided biopsy of the most prominent lymph node in the RIGHT axilla. 3. Postprocedure mammogram to ensure that the masses correspond to the extent of the asymmetry/distortion seen on mammogram. Additional stereotactic biopsy may be needed if the clips do not approximate the anterior and posterior extents. 4. Depending on the location of the post biopsy clips, and if breast conservation surgery is considered, stereotactic biopsy may be also needed for the coarse heterogeneous calcifications within the slightly outer RIGHT breast, measuring 8 mm extent, located 1.2 cm from the asymmetry/distortion.   03/30/2018 Cancer Staging   Staging form: Breast, AJCC 8th Edition - Clinical stage from 03/30/2018: Stage IB (cT1c(m), cN1, cM0, G2, ER+, PR+, HER2-) - Signed by Lanny Callander, MD on 04/08/2018   03/30/2018 Initial Biopsy   Diagnosis 03/30/18 1. Breast, right, needle core biopsy, 12 o'clock position, 5cm from nipple - INVASIVE LOBULAR CARCINOMA, GRADE 2. SEE NOTE. - LOBULAR CARCINOMA IN SITU, INTERMEDIATE NUCLEAR GRADE. 2. Breast, right, needle core biopsy, 12 o'clock position 7cfn - INVASIVE LOBULAR CARCINOMA, GRADE 2. SEE NOTE. 1 of 3 FINAL for Pitz, Ynez R ([id number]) Diagnosis(continued) - LOBULAR CARCINOMA IN SITU, INTERMEDIATE NUCLEAR GRADE. 3. Lymph node, needle/core biopsy, right axilla - METASTATIC LOBULAR CARCINOMA TO LYMPH NODE.   03/30/2018 Receptors her2   Results: IMMUNOHISTOCHEMICAL AND MORPHOMETRIC ANALYSIS PERFORMED MANUALLY The tumor cells are NEGATIVE for Her2 (1+). Estrogen Receptor: 100%, POSITIVE, STRONG STAINING INTENSITY Progesterone Receptor: 70%, POSITIVE, STRONG STAINING INTENSITY Proliferation Marker Ki67: 10%   04/08/2018 Initial Diagnosis  Cancer of central portion of right female  breast (HCC)   04/27/2018 Imaging   CT CAP W Contrast 04/27/18  IMPRESSION: 1. Right axillary and right subpectoral lymphadenopathy, suspicious for metastatic disease. PET-CT may prove helpful to further evaluate. 2. Cholelithiasis. 3. Colonic diverticulosis without diverticulitis. 4.  Aortic Atherosclerois (ICD10-170.0)   04/28/2018 Imaging   Bone Scan 04/28/18  IMPRESSION: No scintigraphic evidence skeletal metastasis. Severe degenerative uptake in the medial compartment RIGHT knee.   06/07/2018 Cancer Staging   Staging form: Breast, AJCC 8th Edition - Pathologic stage from 06/07/2018: Stage IB (pT2(m), pN2a, cM0, G2, ER+, PR+, HER2-) - Signed by Crawford Morna Pickle, NP on 06/22/2018   06/07/2018 Surgery   RIGHT BREAST LUMPECTOMY WITH BRACKETED RADIOACTIVE SEEDS AND RIGHT COMPLETE AXILLARY LYMPH NODE DISSECTION by Dr. Gail 06/07/18    06/07/2018 Pathology Results   Diagnosis 06/07/18 1. Breast, lumpectomy, right with radioactive seeds - INVASIVE LOBULAR CARCINOMA, MULTIFOCAL, LARGER FOCUS IS 3 CM, NOTTINGHAM GRADE 2 OF 3. - MARGINS OF RESECTION ARE NOT INVOLVED (CLOSEST MARGIN: LESS THAN 1 MM, ANTERIOR). - BIOPSY SITE CHANGES. - SEE ONCOLOGY TABLE. 2. Lymph nodes, regional resection, right axillary contents - METASTATIC CARCINOMA PRESENT IN SEVEN OF FOURTEEN LYMPH NODES (7/14). 3. Breast, excision, inferior margin - BREAST PARENCHYMA, NEGATIVE FOR CARCINOMA.   06/2018 -  Anti-estrogen oral therapy   Tamoxifen  20mg  daily starting in March or April 2020.    10/13/2018 - 11/25/2018 Radiation Therapy   Adjuvant radiation to right breat per Dr. Izell   05/31/2019 Survivorship   SCP delivered by Takeem Krotzer, NP       CURRENT THERAPY: Tamoxifen  20 mg daily starting March/April 2020  INTERVAL HISTORY Ms. [name] presents for DEXA review. Last seen by me 02/2023. Doing well overall. Walks some but not a lot, denies fall or fracture. Does not take calcium /vit D. Needs a couple broken  teeth pulled and has kidney dysfunction.   ROS  All other systems reviewed  Past Medical History:  Diagnosis Date   Acute cholecystitis    Allergic rhinitis    Anemia    Arthritis    Cancer (HCC)    Right breast   Chest pain    Chronic renal insufficiency    followed by Dr Douglass stage 3   Clostridium difficile colitis    Constipation    Coronary atherosclerosis of native coronary vessel    Cough    Depression    situational - husband died 05/14/2018   Diabetes mellitus    Dizziness    DM2 (diabetes mellitus, type 2) (HCC)    Dyshidrosis    Family history of adverse reaction to anesthesia    daughter - nausea   GERD (gastroesophageal reflux disease)    Gout    HLD (hyperlipidemia)    HTN (hypertension)    Hypothyroidism    Neck mass    Neck pain    Osteoporosis    Personal history of radiation therapy 12/2018   Routine general medical examination at a health care facility    Secondary hyperparathyroidism (of renal origin)    Urinary incontinence    Urinary tract infection      Past Surgical History:  Procedure Laterality Date   bone density  08/21/05   BREAST BIOPSY Right 03/30/2018   x3   BREAST BIOPSY Right 04/25/2018   BREAST LUMPECTOMY Right 06/07/2018   BREAST LUMPECTOMY WITH RADIOACTIVE SEED AND AXILLARY LYMPH NODE DISSECTION Right 06/07/2018   Procedure: RIGHT BREAST LUMPECTOMY WITH BRACKETED RADIOACTIVE  SEEDS AND RIGHT COMPLETE AXILLARY LYMPH NODE DISSECTION;  Surgeon: Gail Favorite, MD;  Location: [facility] OR;  Service: General;  Laterality: Right;   C-section (other)  1977   CHOLECYSTECTOMY     ELECTROCARDIOGRAM  02/01/06   L ankle surgery Left 1988   total left knee Left 2007     Outpatient Encounter Medications as of 11/22/2023  Medication Sig   ACCU-CHEK GUIDE TEST test strip daily.   acetaminophen  (TYLENOL ) 325 MG tablet Take 1-2 tablets (325-650 mg total) by mouth every 4 (four) hours as needed for mild pain.   allopurinol  (ZYLOPRIM ) 100 MG tablet Take  100 mg by mouth daily.   amoxicillin  (AMOXIL ) 250 MG capsule Take 250 mg by mouth every 12 (twelve) hours.   aspirin  EC 81 MG tablet Take 81 mg by mouth daily.   atorvastatin  (LIPITOR) 40 MG tablet Take 1 tablet (40 mg total) by mouth daily.   azithromycin (ZITHROMAX) 250 MG tablet Take by mouth.   Carboxymeth-Glyc-Polysorb PF (REFRESH OPTIVE MEGA-3) 0.5-1-0.5 % SOLN Apply 1 drop to eye daily.   dapagliflozin propanediol (FARXIGA) 10 MG TABS tablet Take 10 mg by mouth daily.   famotidine (PEPCID) 20 MG tablet Take 20 mg by mouth 2 (two) times daily.   Ferrous Sulfate  (IRON ) 325 (65 Fe) MG TABS Take 1 tablet (325 mg total) by mouth daily.   fluticasone  (FLONASE ) 50 MCG/ACT nasal spray Place 1 spray into both nostrils daily as needed.   furosemide  (LASIX ) 40 MG tablet TAKE 1 TABLET(40 MG) BY MOUTH DAILY   glipiZIDE (GLUCOTROL) 5 MG tablet Take 5 mg by mouth every morning.   hydrALAZINE  (APRESOLINE ) 50 MG tablet TAKE 1 AND 1/2 TABLETS(75 MG) BY MOUTH TWICE DAILY   levocetirizine (XYZAL) 5 MG tablet Take 5 mg by mouth every evening.   levothyroxine  (SYNTHROID ) 88 MCG tablet Take 88 mcg by mouth daily.   liothyronine  (CYTOMEL ) 5 MCG tablet Take 5 mcg by mouth daily.   metoprolol  tartrate (LOPRESSOR ) 25 MG tablet Take 0.5 tablets (12.5 mg total) by mouth 2 (two) times daily.   Multiple Vitamins-Minerals (MULTIVITAMIN WOMEN 50+ PO) Take 1 tablet by mouth daily.    nitroGLYCERIN  (NITROSTAT ) 0.4 MG SL tablet PLACE 1 TABLET(0.4 MG TOTAL) UNDER THE TONGUE EVERY 5(FIVE) MINUTES AS NEEDED FOR CHEST PAIN.   senna (SENOKOT) 8.6 MG tablet Take 2 tablets by mouth daily.   tamoxifen  (NOLVADEX ) 20 MG tablet Take 1 tablet (20 mg total) by mouth daily.   No facility-administered encounter medications on file as of 11/22/2023.     There were no vitals filed for this visit. There is no height or weight on file to calculate BMI.    PHYSICAL EXAM Pt appears well by phone. Voice is strong, speech is clear.  Mood/affect appear normal for situation. No cough or conversational dyspnea    LAB DATA No labs for this encounter    ASSESSMENT & PLAN:Stacy Walls is a 89 y.o. female with    1. Cancer of central right breast, invasive carcinoma, stage IB, pT2c(m)N2aM0, ER+/PR+, HER2-, Grade II -diagnosed in 03/2018. She underwent right breast lumpectomy and axillary lymph node dissection on 06/07/18. 7/14 positive lymph nodes.  -Dr. Lanny did not recommend adjuvant chemotherapy given her age, medical comorbidities, and lobular histology which is less sensitive to chemotherapy -She began adjuvant Tamoxifen  in 06/2018 prior to radiation due to covid19-related delay. We discussed the risk of distant recurrence in lobular breast cancers. Plan to continue for a total of 5-10 years  -  to reduce local recurrence risk, she completed adjuvant radiation per Dr. Izell on 11/25/18.  -Mammo 06/17/23 benign, tolerating tamoxifen  -Next scheduled f/up 02/2025   2. Iron  deficient anemia and anemia of chronic disease secondary to CKD  -She was previously being treated with monthly EPO injections by Dr. Douglass which was stopped due to possible tumor growth.  -Hgb 10-12 range since 02/2020 - Takes oral iron    3. CKD, stage III, HTN, HL, DM -On Lasix , hydralazine , metoprolol , lipitor, Farxiga -Managed by PCP, nephrologist and cardiologist.  -Continue f/up with Dr. Gearline and PCP   4. Osteoporosis, OA  -Since DEXA 03/2014 lowest T score -2.6 to -2.8 -She ambulates with cane, no fall. She does not take calcium /vit D -11/11/23 DEXA: T score -3/2 RFN, -3.1 right total hip; FRAX score not calculated   5.  Routine health maintenance -Patient reportedly never had a colonoscopy.  -Previously encouraged her to continue healthy active lifestyle as tolerated and follow-up with her primary medical team     Disposition:  I reviewed her most recent DEXA which shows worsening osteoporosis (T score -3.2) in RFN and right hip.  She understands she is at high risk for fracture. We reviewed management strategies. I recommend daily calcium /D3 and increase weight bearing. Due to calcium  level high-normal range she can take 600 mg daily. I also recommend safe weight bearing exercise as tolerated. She agrees.   She is not a great candidate for oral/IV bisphosphonate due to her dental health and renal dysfunction. Will hold for now.   Return 02/2024 for annual visit as scheduled, or sooner if needed.   All questions were answered. The patient knows to call the clinic with any problems, questions or concerns. No barriers to learning were detected. I spent 7 minutes counseling the patient face to face. The total time spent in the appointment was 10 minutes and more than 50% was on counseling, review of test results, and coordination of care.   Zetta Stoneman K Claribel Sachs, NP 11/22/2023

## 2023-12-09 ENCOUNTER — Encounter (HOSPITAL_COMMUNITY): Payer: Self-pay | Admitting: Nephrology

## 2023-12-28 ENCOUNTER — Encounter (HOSPITAL_COMMUNITY): Payer: Self-pay | Admitting: Nephrology

## 2024-01-03 ENCOUNTER — Other Ambulatory Visit: Payer: Self-pay | Admitting: Obstetrics and Gynecology

## 2024-01-03 DIAGNOSIS — R1031 Right lower quadrant pain: Secondary | ICD-10-CM

## 2024-01-05 ENCOUNTER — Ambulatory Visit (INDEPENDENT_AMBULATORY_CARE_PROVIDER_SITE_OTHER): Admitting: Podiatry

## 2024-01-05 DIAGNOSIS — Z91198 Patient's noncompliance with other medical treatment and regimen for other reason: Secondary | ICD-10-CM

## 2024-01-05 NOTE — Progress Notes (Signed)
 Failure to attend appointment with reason given Appointment canceled by patient.

## 2024-01-12 ENCOUNTER — Encounter: Payer: Self-pay | Admitting: Obstetrics and Gynecology

## 2024-01-18 ENCOUNTER — Ambulatory Visit
Admission: RE | Admit: 2024-01-18 | Discharge: 2024-01-18 | Disposition: A | Discharge status: Home / Self Care | Source: Ambulatory Visit | Attending: Obstetrics and Gynecology | Admitting: Obstetrics and Gynecology

## 2024-01-18 ENCOUNTER — Encounter (HOSPITAL_COMMUNITY): Payer: Self-pay | Admitting: Nephrology

## 2024-01-18 DIAGNOSIS — R1031 Right lower quadrant pain: Secondary | ICD-10-CM

## 2024-02-17 ENCOUNTER — Other Ambulatory Visit: Payer: Self-pay | Admitting: Nurse Practitioner

## 2024-02-17 DIAGNOSIS — Z170 Estrogen receptor positive status [ER+]: Secondary | ICD-10-CM

## 2024-02-22 ENCOUNTER — Other Ambulatory Visit: Payer: Self-pay | Admitting: Cardiology

## 2024-03-02 ENCOUNTER — Encounter (HOSPITAL_COMMUNITY): Payer: Self-pay | Admitting: Nephrology

## 2024-03-08 ENCOUNTER — Other Ambulatory Visit: Payer: Self-pay

## 2024-03-08 DIAGNOSIS — C50111 Malignant neoplasm of central portion of right female breast: Secondary | ICD-10-CM

## 2024-03-08 NOTE — Assessment & Plan Note (Signed)
 stage IB, pT2c(m)N2aM0, ER+/PR+, HER2-, Grade II -diagnosed in 03/2018. She underwent right breast lumpectomy and axillary lymph node dissection on 06/07/18. 7/14 positive lymph nodes.  -Dr. Lanny did not recommend adjuvant chemotherapy given her age, medical comorbidities, and lobular histology which is less sensitive to chemotherapy -She began adjuvant Tamoxifen  in 06/2018 prior to radiation due to covid19-related delay. We discussed the risk of distant recurrence in lobular breast cancers. Plan to continue for a total of 5-10 years  -to reduce local recurrence risk, she completed adjuvant radiation per Dr. Izell on 11/25/18.  -Mammo 06/17/23 benign, tolerating tamoxifen  -Next scheduled f/up 02/2024

## 2024-03-08 NOTE — Progress Notes (Unsigned)
 Patient Care Team: Sharyne Harlene CROME, NP as PCP - General (Nurse Practitioner) Jeffrie Oneil BROCKS, MD as PCP - Cardiology (Cardiology) Douglass Lunger, MD (Nephrology) Deward Specking, MD (Orthopedic Surgery) Austin Ned, MD as Consulting Physician (Obstetrics and Gynecology) Lanny Callander, MD as Consulting Physician (Hematology) Gail Favorite, MD as Consulting Physician (General Surgery) Burton, Lacie K, NP as Nurse Practitioner (Nurse Practitioner) Izell Domino, MD as Attending Physician (Radiation Oncology)  Clinic Day:  03/09/2024  Referring physician: Sharyne Harlene CROME, NP  ASSESSMENT & PLAN:   Assessment & Plan: Cancer of central portion of right female breast (HCC) stage IB, pT2c(m)N2aM0, ER+/PR+, HER2-, Grade II -diagnosed in 03/2018. She underwent right breast lumpectomy and axillary lymph node dissection on 06/07/18. 7/14 positive lymph nodes.  -Dr. Lanny did not recommend adjuvant chemotherapy given her age, medical comorbidities, and lobular histology which is less sensitive to chemotherapy -She began adjuvant Tamoxifen  in 06/2018 prior to radiation due to covid19-related delay. We discussed the risk of distant recurrence in lobular breast cancers. Plan to continue for a total of 5-10 years  -to reduce local recurrence risk, she completed adjuvant radiation per Dr. Izell on 11/25/18.  -Mammo 06/17/23 benign, tolerating tamoxifen  - DEXA scan from 11/11/2023 showing worsening osteoporosis.  Patient poor candidate for bisphosphonates due to jaw and dental problems.  Now taking calcium  and vitamin D  every day.  More active with safe weightbearing exercises at home.  Repeat DEXA scan in 10/2025. -Bilateral screening mammogram ordered as part of today's visit. -Continue tamoxifen  daily. - Plan for labs and follow-up in 5 months, sooner if needed.   [facility] Her most recent DEXA which shows worsening osteoporosis (T score -3.2) in RFN and right hip. She understands she is at high risk  for fracture. Daily calcium /D3 was recommended along with increased weight bearing. Due to calcium  level high-normal range she can take 600 mg daily.  She states that she has started taking a calcium  and vitamin D  supplement everyday.  She has also been getting up and walking around more.  She has been careful to limit fall risks in her home.  **She is not a great candidate for oral/IV bisphosphonate due to her dental health and renal dysfunction. Will hold for now.   Right breast cancer, ER + Currently on tamoxifen  daily. Initially sarted in 2020 with goal of 5 - 10 years. She is tolerating well without negative side effects. Most recent screening mammogram from 06/17/2023 was negative.  Next mammogram will be ordered as part of today's visit.  History CKD Abnormal BUN and creatinine with reduced eGFR.  Patient to see nephrology for management.  Plan Labs reviewed. - Mild and stable anemia with Hgb 11.6 and HCT 35.2.  She is on oral iron  supplementation daily. - Abnormal renal functions for which she sees nephrology. - CA 27-29 level pending. Screening mammogram will be ordered for end of February/early March 2026. Continue tamoxifen  daily. Plan for labs and follow-up in 12 months, sooner if needed.  The patient understands the plans discussed today and is in agreement with them.  She knows to contact our office if she develops concerns prior to her next appointment.  I provided 25 minutes of face-to-face time during this encounter and > 50% was spent counseling as documented under my assessment and plan.    Powell FORBES Lessen, NP  [facility] - A DEPT OF [place]  [facility]OSP[facility] [address] [place] [address] Dept:  [phone] Dept Fax: [phone]   No orders of the defined types were placed in this encounter.     CHIEF COMPLAINT:  CC: Right breast cancer, ER +  Current Treatment: Tamoxifen  started mid 2020 -goal of 5  to 10 years  INTERVAL HISTORY:  Stacy Walls is here today for repeat clinical assessment.  She last saw Lacie, NP, on 11/22/2023.  She will be due for screening mammogram in late February/early march 2026.she had DEXA scan 11/11/2023 showing worsening osteoporosis.  She is not a good candidate for bisphosphonates due to dental concerns.  She has started daily calcium  and vitamin D .  She is more active with weightbearing exercises at home.  She has limited fall risks to prevent osteoporotic fractures.  She continues tamoxifen  daily.  She denies negative side effects.  She denies changes, masses, or lumps in either breast.  She denies chest pain, chest pressure, or shortness of breath. He denies headaches or visual disturbances. She denies abdominal pain, nausea, vomiting, or changes in bowel or bladder habits.  She denies fevers or chills. She denies pain. Her appetite is good. Her weight has been stable.  I have reviewed the past medical history, past surgical history, social history and family history with the patient and they are unchanged from previous note.  ALLERGIES:  is allergic to ace inhibitors, azithromycin, pioglitazone, sulfonamide derivatives, tramadol, and amlodipine .  MEDICATIONS:  Current Outpatient Medications  Medication Sig Dispense Refill   ACCU-CHEK GUIDE TEST test strip daily.     acetaminophen  (TYLENOL ) 325 MG tablet Take 1-2 tablets (325-650 mg total) by mouth every 4 (four) hours as needed for mild pain.     allopurinol  (ZYLOPRIM ) 100 MG tablet Take 100 mg by mouth daily.     aspirin  EC 81 MG tablet Take 81 mg by mouth daily.     atorvastatin  (LIPITOR) 40 MG tablet Take 1 tablet (40 mg total) by mouth daily. 90 tablet 1   azithromycin (ZITHROMAX) 250 MG tablet Take by mouth.     Carboxymeth-Glyc-Polysorb PF (REFRESH OPTIVE MEGA-3) 0.5-1-0.5 % SOLN Apply 1 drop to eye daily.     dapagliflozin propanediol (FARXIGA) 10 MG TABS tablet Take 10 mg by mouth daily.     famotidine  (PEPCID) 20 MG tablet Take 20 mg by mouth 2 (two) times daily.     Ferrous Sulfate  (IRON ) 325 (65 Fe) MG TABS Take 1 tablet (325 mg total) by mouth daily. 30 tablet 0   fluticasone  (FLONASE ) 50 MCG/ACT nasal spray Place 1 spray into both nostrils daily as needed. 9.9 mL 3   furosemide  (LASIX ) 40 MG tablet TAKE 1 TABLET(40 MG) BY MOUTH DAILY 90 tablet 3   glipiZIDE (GLUCOTROL) 5 MG tablet Take 5 mg by mouth every morning.     hydrALAZINE  (APRESOLINE ) 50 MG tablet TAKE 1 AND 1/2 TABLETS(75 MG) BY MOUTH TWICE DAILY 270 tablet 2   levocetirizine (XYZAL) 5 MG tablet Take 5 mg by mouth every evening.     levothyroxine  (SYNTHROID ) 88 MCG tablet Take 88 mcg by mouth daily.     liothyronine  (CYTOMEL ) 5 MCG tablet Take 5 mcg by mouth daily.     metoprolol  tartrate (LOPRESSOR ) 25 MG tablet Take 0.5 tablets (12.5 mg total) by mouth 2 (two) times daily. 90 tablet 3   Multiple Vitamins-Minerals (MULTIVITAMIN WOMEN 50+ PO) Take 1 tablet by mouth daily.      nitroGLYCERIN  (NITROSTAT ) 0.4 MG SL tablet PLACE 1 TABLET(0.4 MG TOTAL) UNDER THE TONGUE EVERY 5(FIVE) MINUTES AS NEEDED  FOR CHEST PAIN. 25 tablet 8   senna (SENOKOT) 8.6 MG tablet Take 2 tablets by mouth daily.     tamoxifen  (NOLVADEX ) 20 MG tablet TAKE 1 TABLET(20 MG) BY MOUTH DAILY 90 tablet 3   No current facility-administered medications for this visit.    HISTORY OF PRESENT ILLNESS:   Oncology History Overview Note  Cancer Staging Cancer of central portion of right female breast [facility]) Staging form: Breast, AJCC 8th Edition - Clinical stage from 03/30/2018: Stage IB (cT1c(m), cN1, cM0, G2, ER+, PR+, HER2-) - Signed by Lanny Callander, MD on 04/08/2018 - Pathologic stage from 06/07/2018: Stage IB (pT2(m), pN2a, cM0, G2, ER+, PR+, HER2-) - Signed by Crawford Morna Pickle, NP on 06/22/2018     Cancer of central portion of right female breast (HCC)  01/13/2018 Mammogram   Diagnostic Mammogram 01/13/18  RECOMMENDATION: 1. Ultrasound-guided biopsies of  both masses in the RIGHT breast identified by ultrasound at the 12 o'clock axis (7 cm from the nipple measuring 1.1 x 0.8 x 0.7 cm and 5 cm from the nipple measuring 1 x 0.8 x 0.9 cm respectively). 2. Ultrasound-guided biopsy of the most prominent lymph node in the RIGHT axilla. 3. Postprocedure mammogram to ensure that the masses correspond to the extent of the asymmetry/distortion seen on mammogram. Additional stereotactic biopsy may be needed if the clips do not approximate the anterior and posterior extents. 4. Depending on the location of the post biopsy clips, and if breast conservation surgery is considered, stereotactic biopsy may be also needed for the coarse heterogeneous calcifications within the slightly outer RIGHT breast, measuring 8 mm extent, located 1.2 cm from the asymmetry/distortion.   03/30/2018 Cancer Staging   Staging form: Breast, AJCC 8th Edition - Clinical stage from 03/30/2018: Stage IB (cT1c(m), cN1, cM0, G2, ER+, PR+, HER2-) - Signed by Lanny Callander, MD on 04/08/2018   03/30/2018 Initial Biopsy   Diagnosis 03/30/18 1. Breast, right, needle core biopsy, 12 o'clock position, 5cm from nipple - INVASIVE LOBULAR CARCINOMA, GRADE 2. SEE NOTE. - LOBULAR CARCINOMA IN SITU, INTERMEDIATE NUCLEAR GRADE. 2. Breast, right, needle core biopsy, 12 o'clock position 7cfn - INVASIVE LOBULAR CARCINOMA, GRADE 2. SEE NOTE. 1 of 3 FINAL for Wehner, Cira R ([id number]) Diagnosis(continued) - LOBULAR CARCINOMA IN SITU, INTERMEDIATE NUCLEAR GRADE. 3. Lymph node, needle/core biopsy, right axilla - METASTATIC LOBULAR CARCINOMA TO LYMPH NODE.   03/30/2018 Receptors her2   Results: IMMUNOHISTOCHEMICAL AND MORPHOMETRIC ANALYSIS PERFORMED MANUALLY The tumor cells are NEGATIVE for Her2 (1+). Estrogen Receptor: 100%, POSITIVE, STRONG STAINING INTENSITY Progesterone Receptor: 70%, POSITIVE, STRONG STAINING INTENSITY Proliferation Marker Ki67: 10%   04/08/2018 Initial Diagnosis    Cancer of central portion of right female breast (HCC)   04/27/2018 Imaging   CT CAP W Contrast 04/27/18  IMPRESSION: 1. Right axillary and right subpectoral lymphadenopathy, suspicious for metastatic disease. PET-CT may prove helpful to further evaluate. 2. Cholelithiasis. 3. Colonic diverticulosis without diverticulitis. 4.  Aortic Atherosclerois (ICD10-170.0)   04/28/2018 Imaging   Bone Scan 04/28/18  IMPRESSION: No scintigraphic evidence skeletal metastasis. Severe degenerative uptake in the medial compartment RIGHT knee.   06/07/2018 Cancer Staging   Staging form: Breast, AJCC 8th Edition - Pathologic stage from 06/07/2018: Stage IB (pT2(m), pN2a, cM0, G2, ER+, PR+, HER2-) - Signed by Crawford Morna Pickle, NP on 06/22/2018   06/07/2018 Surgery   RIGHT BREAST LUMPECTOMY WITH BRACKETED RADIOACTIVE SEEDS AND RIGHT COMPLETE AXILLARY LYMPH NODE DISSECTION by Dr. Gail 06/07/18    06/07/2018 Pathology Results   Diagnosis 06/07/18  1. Breast, lumpectomy, right with radioactive seeds - INVASIVE LOBULAR CARCINOMA, MULTIFOCAL, LARGER FOCUS IS 3 CM, NOTTINGHAM GRADE 2 OF 3. - MARGINS OF RESECTION ARE NOT INVOLVED (CLOSEST MARGIN: LESS THAN 1 MM, ANTERIOR). - BIOPSY SITE CHANGES. - SEE ONCOLOGY TABLE. 2. Lymph nodes, regional resection, right axillary contents - METASTATIC CARCINOMA PRESENT IN SEVEN OF FOURTEEN LYMPH NODES (7/14). 3. Breast, excision, inferior margin - BREAST PARENCHYMA, NEGATIVE FOR CARCINOMA.   06/2018 -  Anti-estrogen oral therapy   Tamoxifen  20mg  daily starting in March or April 2020.    10/13/2018 - 11/25/2018 Radiation Therapy   Adjuvant radiation to right breat per Dr. Izell   05/31/2019 Survivorship   SCP delivered by Lacie Burton, NP        REVIEW OF SYSTEMS:   Constitutional: Denies fevers, chills or abnormal weight loss Eyes: Denies blurriness of vision Ears, nose, mouth, throat, and face: Denies mucositis or sore throat Respiratory: Denies cough, dyspnea  or wheezes Cardiovascular: Denies palpitation, chest discomfort or lower extremity swelling Gastrointestinal:  Denies nausea, heartburn or change in bowel habits Skin: Denies abnormal skin rashes Lymphatics: Denies new lymphadenopathy or easy bruising Neurological:Denies numbness, tingling or new weaknesses Behavioral/Psych: Mood is stable, no new changes  All other systems were reviewed with the patient and are negative.   VITALS:   Today's Vitals   03/09/24 1107 03/09/24 1110  BP: (!) 155/72 (!) 148/65  Pulse: (!) 58   Resp: 18   Temp: 98.1 F (36.7 C)   TempSrc: Temporal   SpO2: 98%   Weight: 208 lb 14.4 oz (94.8 kg)    Body mass index is 38.21 kg/m.   Wt Readings from Last 3 Encounters:  03/09/24 208 lb 14.4 oz (94.8 kg)  06/29/23 210 lb (95.3 kg)  03/09/23 210 lb (95.3 kg)    Body mass index is 38.21 kg/m.  Performance status (ECOG): 1 - Symptomatic but completely ambulatory  PHYSICAL EXAM:   GENERAL:alert, no distress and comfortable SKIN: skin color, texture, turgor are normal, no rashes or significant lesions EYES: normal, Conjunctiva are pink and non-injected, sclera clear OROPHARYNX:no exudate, no erythema and lips, buccal mucosa, and tongue normal  NECK: supple, thyroid  normal size, non-tender, without nodularity LYMPH:  no palpable lymphadenopathy in the cervical, axillary or inguinal LUNGS: clear to auscultation and percussion with normal breathing effort HEART: regular rate & rhythm and no murmurs and no lower extremity edema ABDOMEN:abdomen soft, non-tender and normal bowel sounds Musculoskeletal:no cyanosis of digits and no clubbing  NEURO: alert & oriented x 3 with fluent speech, no focal motor/sensory deficits MUSCULOSKELETAL: Patient currently in wheelchair (at baseline:. BREAST: The right breast has well-healed, faint lumpectomy scar [place] superior aspect.  There are no palpable masses or lumps today.  There is no nipple inversion or nipple  discharge.  There is no axillary lymphadenopathy on the right.  The left breast has no palpable masses or lumps today.  There is no nipple inversion or nipple discharge.  There is no axillary lymphadenopathy on the left.  LABORATORY DATA:  I have reviewed the data as listed    Component Value Date/Time   NA 140 03/09/2024 1041   NA 142 05/26/2022 1429   K 3.7 03/09/2024 1041   CL 102 03/09/2024 1041   CO2 28 03/09/2024 1041   GLUCOSE 111 (H) 03/09/2024 1041   BUN 24 (H) 03/09/2024 1041   BUN 18 05/26/2022 1429   CREATININE 1.71 (H) 03/09/2024 1041   CALCIUM  10.2 03/09/2024 1041  CALCIUM  10.5 01/12/2011 1116   PROT 6.4 (L) 03/09/2024 1041   ALBUMIN 3.9 03/09/2024 1041   AST 31 03/09/2024 1041   ALT 14 03/09/2024 1041   ALKPHOS 53 03/09/2024 1041   BILITOT 0.7 03/09/2024 1041   GFRNONAA 28 (L) 03/09/2024 1041   GFRAA 30 (L) 02/08/2020 1050   GFRAA 28 (L) 09/28/2019 1108     Lab Results  Component Value Date   WBC 5.1 03/09/2024   NEUTROABS 2.6 03/09/2024   HGB 11.6 (L) 03/09/2024   HCT 35.2 (L) 03/09/2024   MCV 93.1 03/09/2024   PLT 102 (L) 03/09/2024

## 2024-03-09 ENCOUNTER — Ambulatory Visit: Payer: Medicare PPO | Admitting: Nurse Practitioner

## 2024-03-09 ENCOUNTER — Inpatient Hospital Stay (HOSPITAL_BASED_OUTPATIENT_CLINIC_OR_DEPARTMENT_OTHER): Payer: Medicare PPO | Admitting: Nurse Practitioner

## 2024-03-09 ENCOUNTER — Encounter: Payer: Self-pay | Admitting: Nurse Practitioner

## 2024-03-09 ENCOUNTER — Inpatient Hospital Stay: Payer: Medicare PPO | Attending: Nurse Practitioner

## 2024-03-09 VITALS — BP 148/65 | HR 58 | Temp 98.1°F | Resp 18 | Wt 208.9 lb

## 2024-03-09 DIAGNOSIS — M810 Age-related osteoporosis without current pathological fracture: Secondary | ICD-10-CM | POA: Diagnosis not present

## 2024-03-09 DIAGNOSIS — C50111 Malignant neoplasm of central portion of right female breast: Secondary | ICD-10-CM

## 2024-03-09 DIAGNOSIS — N189 Chronic kidney disease, unspecified: Secondary | ICD-10-CM | POA: Insufficient documentation

## 2024-03-09 DIAGNOSIS — Z79810 Long term (current) use of selective estrogen receptor modulators (SERMs): Secondary | ICD-10-CM | POA: Diagnosis not present

## 2024-03-09 DIAGNOSIS — Z170 Estrogen receptor positive status [ER+]: Secondary | ICD-10-CM | POA: Insufficient documentation

## 2024-03-09 LAB — CMP (CANCER CENTER ONLY)
ALT: 14 U/L (ref 0–44)
AST: 31 U/L (ref 15–41)
Albumin: 3.9 g/dL (ref 3.5–5.0)
Alkaline Phosphatase: 53 U/L (ref 38–126)
Anion gap: 10 (ref 5–15)
BUN: 24 mg/dL — ABNORMAL HIGH (ref 8–23)
CO2: 28 mmol/L (ref 22–32)
Calcium: 10.2 mg/dL (ref 8.9–10.3)
Chloride: 102 mmol/L (ref 98–111)
Creatinine: 1.71 mg/dL — ABNORMAL HIGH (ref 0.44–1.00)
GFR, Estimated: 28 mL/min — ABNORMAL LOW (ref >60)
Glucose, Bld: 111 mg/dL — ABNORMAL HIGH (ref 70–99)
Potassium: 3.7 mmol/L (ref 3.5–5.1)
Sodium: 140 mmol/L (ref 135–145)
Total Bilirubin: 0.7 mg/dL (ref 0.0–1.2)
Total Protein: 6.4 g/dL — ABNORMAL LOW (ref 6.5–8.1)

## 2024-03-09 LAB — CBC WITH DIFFERENTIAL (CANCER CENTER ONLY)
Abs Immature Granulocytes: 0.01 K/uL (ref 0.00–0.07)
Basophils Absolute: 0 K/uL (ref 0.0–0.1)
Basophils Relative: 1 %
Eosinophils Absolute: 0.1 K/uL (ref 0.0–0.5)
Eosinophils Relative: 2 %
HCT: 35.2 % — ABNORMAL LOW (ref 36.0–46.0)
Hemoglobin: 11.6 g/dL — ABNORMAL LOW (ref 12.0–15.0)
Immature Granulocytes: 0 %
Lymphocytes Relative: 36 %
Lymphs Abs: 1.8 K/uL (ref 0.7–4.0)
MCH: 30.7 pg (ref 26.0–34.0)
MCHC: 33 g/dL (ref 30.0–36.0)
MCV: 93.1 fL (ref 80.0–100.0)
Monocytes Absolute: 0.5 K/uL (ref 0.1–1.0)
Monocytes Relative: 11 %
Neutro Abs: 2.6 K/uL (ref 1.7–7.7)
Neutrophils Relative %: 50 %
Platelet Count: 102 K/uL — ABNORMAL LOW (ref 150–400)
RBC: 3.78 MIL/uL — ABNORMAL LOW (ref 3.87–5.11)
RDW: 12.7 % (ref 11.5–15.5)
WBC Count: 5.1 K/uL (ref 4.0–10.5)
nRBC: 0 % (ref 0.0–0.2)

## 2024-03-10 LAB — CANCER ANTIGEN 27.29: CA 27.29: 20.5 U/mL (ref 0.0–38.6)

## 2024-03-21 ENCOUNTER — Telehealth: Payer: Self-pay | Admitting: Cardiology

## 2024-03-21 NOTE — Telephone Encounter (Signed)
 Pt c/o of Chest Pain: STAT if active (IN THIS MOMENT) CP, including tightness, pressure, jaw pain, shoulder/upper arm/back pain, SOB, nausea, and vomiting.  1. Are you having CP right now (tightness, pressure, or discomfort)?  No   2. Are you experiencing any other symptoms (ex. SOB, nausea, vomiting, sweating)?  Occasional pain in head   3. How long have you been experiencing CP?  About 1 month  4. Is your CP continuous or coming and going?  Coming and going   5. Have you taken Nitroglycerin ?  Yes

## 2024-03-21 NOTE — Telephone Encounter (Signed)
 Spoke to patient and she has been having chest pain episodes that come and go for the last month. Pt reports that she is having tight chest pain and she will take a nitroglycerin  and the pain will go away. Pt reports blood pressures after taking her medications are around 130 systolic and without blood pressure medication she ranges from 164-170 systolic. Pt admits to staying hydrated. Reviewed if chest pain returns to go to the emergency room for evaluation. Pt agrees with plan of care. Pt was scheduled an appointment for 03/27/24, but would you want to get her in sooner? Looks like you have a blocked slot on 03/22/2024.

## 2024-03-27 ENCOUNTER — Encounter: Payer: Self-pay | Admitting: Cardiology

## 2024-03-27 ENCOUNTER — Ambulatory Visit: Attending: Cardiology | Admitting: Cardiology

## 2024-03-27 VITALS — BP 122/62 | HR 61 | Ht 62.0 in | Wt 207.2 lb

## 2024-03-27 DIAGNOSIS — I10 Essential (primary) hypertension: Secondary | ICD-10-CM

## 2024-03-27 DIAGNOSIS — I25118 Atherosclerotic heart disease of native coronary artery with other forms of angina pectoris: Secondary | ICD-10-CM | POA: Diagnosis not present

## 2024-03-27 MED ORDER — NITROGLYCERIN 0.4 MG SL SUBL: 0.4000 mg | SUBLINGUAL_TABLET | SUBLINGUAL | 4 refills | Status: AC | PRN

## 2024-03-27 NOTE — Telephone Encounter (Signed)
 Pt was seen in office by Dr Jeffrie today.  Please see his note for further documentation.

## 2024-03-27 NOTE — Patient Instructions (Signed)

## 2024-03-27 NOTE — Progress Notes (Signed)
  Cardiology Office Note:  .   Date:  03/27/2024  ID:  Stacy Walls, DOB October 30, 1932, MRN [id number] PCP: Sharyne Harlene CROME, NP  [facility] Providers Cardiologist:  Oneil Parchment, MD     History of Present Illness: .   Stacy Walls is a 89 y.o. female Discussed the use of AI scribe   History of Present Illness Stacy Walls is an 89 year old female with coronary artery disease and aortic atherosclerosis who presents for follow-up of chest pain.  She has experienced intermittent episodes of chest pain over the past month, described as a tightness in her chest. These episodes are relieved by nitroglycerin , and she has not had any chest pain in the past three weeks.  Her medical history includes coronary artery disease, aortic atherosclerosis, and diabetes. An echocardiogram in 2020 showed a normal ejection fraction, and she has had stable angina for some time. Her EKG was noted to be non-ischemic.  She is currently taking atorvastatin .  Her family history includes a grandmother who lived to 43 years old and an aunt who lived to nearly 32 years old. She recently celebrated her 9th birthday with family, including her brother and scientist, forensic, who traveled from [place] to attend.     Studies Reviewed: SABRA   EKG Interpretation Date/Time:  Monday March 27 2024 15:28:01 EST Ventricular Rate:  61 PR Interval:  198 QRS Duration:  82 QT Interval:  438 QTC Calculation: 440 R Axis:   3  Text Interpretation: Normal sinus rhythm Normal ECG When compared with ECG of 16-Feb-2023 10:44, No significant change was found Confirmed by Parchment Oneil ([id number]) on 03/27/2024 3:39:47 PM    Results DIAGNOSTIC Echocardiogram: Normal ejection fraction (2020) EKG: Non-ischemic  Risk Assessment/Calculations:            Physical Exam:   VS:  BP 122/62 (BP Location: Left Arm, Patient Position: Sitting, Cuff Size: Large)   Pulse 61   Ht 5' 2 (1.575 m)   Wt 207  lb 3.2 oz (94 kg)   SpO2 93%   BMI 37.90 kg/m    Wt Readings from Last 3 Encounters:  03/27/24 207 lb 3.2 oz (94 kg)  03/09/24 208 lb 14.4 oz (94.8 kg)  06/29/23 210 lb (95.3 kg)    GEN: Well nourished, well developed in no acute distress, cane NECK: No JVD; No carotid bruits CARDIAC: RRR, no murmurs, no rubs, no gallops RESPIRATORY:  Clear to auscultation without rales, wheezing or rhonchi  ABDOMEN: Soft, non-tender, non-distended EXTREMITIES:  No edema; No deformity   ASSESSMENT AND PLAN: .    Assessment and Plan Assessment & Plan Stable angina pectoris in the setting of coronary artery disease and aortic atherosclerosis Intermittent chest pain episodes over the past month/ year, relieved by nitroglycerin . No episodes in the last three weeks. Normal ejection fraction on echocardiogram from 2020. Non-ischemic EKG. Coronary calcification present. Current management with atorvastatin  is effective in stabilizing plaque. - Continue current management with atorvastatin . - Use nitroglycerin  as needed for chest pain. - Monitor for worsening symptoms and report if she occurs.  Essential hypertension Blood pressure is well-controlled with current management. - Continue current blood pressure management regimen.         Dispo: 1 yr  Signed, Oneil Parchment, MD

## 2024-04-05 ENCOUNTER — Ambulatory Visit: Admitting: Podiatry

## 2024-04-05 ENCOUNTER — Other Ambulatory Visit: Payer: Self-pay | Admitting: Nurse Practitioner

## 2024-04-05 ENCOUNTER — Encounter: Payer: Self-pay | Admitting: Podiatry

## 2024-04-05 DIAGNOSIS — M79674 Pain in right toe(s): Secondary | ICD-10-CM | POA: Diagnosis not present

## 2024-04-05 DIAGNOSIS — M79675 Pain in left toe(s): Secondary | ICD-10-CM

## 2024-04-05 DIAGNOSIS — B351 Tinea unguium: Secondary | ICD-10-CM

## 2024-04-05 DIAGNOSIS — N1832 Chronic kidney disease, stage 3b: Secondary | ICD-10-CM | POA: Diagnosis not present

## 2024-04-05 DIAGNOSIS — E1122 Type 2 diabetes mellitus with diabetic chronic kidney disease: Secondary | ICD-10-CM | POA: Diagnosis not present

## 2024-04-05 DIAGNOSIS — Z1231 Encounter for screening mammogram for malignant neoplasm of breast: Secondary | ICD-10-CM

## 2024-04-09 NOTE — Progress Notes (Signed)
"  °  Subjective:  Patient ID: Stacy Walls, female    DOB: [birth year],  MRN: [id number]  Stacy Walls presents to clinic today for at risk foot care. Pt has h/o NIDDM with chronic kidney disease and painful elongated mycotic toenails 1-5 bilaterally which are tender when wearing enclosed shoe gear. Pain is relieved with periodic professional debridement.  Chief Complaint  Patient presents with   [facility]    [facility] Glucose 111, 03/09/24 Stacy Harlene CROME, NP (PCP),  Stacy Walls   New problem(s): None.   PCP is Stacy Harlene CROME, NP.  Allergies[1]  Review of Systems: Negative except as noted in the HPI.  Objective: No changes noted in today's physical examination. There were no vitals filed for this visit. Stacy Walls is a pleasant 89 y.o. female in NAD. AAO x 3.  Vascular Examination: Capillary refill time immediate b/l. Palpable DP pulses. Faintly palpable PT pulse(s) b/l LE. Pedal hair absent. No pain with calf compression b/l. Nonpitting edema noted BLE. No cyanosis or clubbing noted b/l LE.  Neurological Examination: Sensation grossly intact b/l with 10 gram monofilament. Vibratory sensation intact b/l.   Dermatological Examination: Pedal skin with normal turgor, texture and tone b/l.  No open wounds. No interdigital macerations.   Toenails 1-5 b/l thick, discolored, elongated with subungual debris and pain on dorsal palpation.   No hyperkeratotic nor porokeratotic lesions present on today's visit.  Musculoskeletal Examination: Muscle strength 5/5 to all lower extremity muscle groups bilaterally. No pain, crepitus or joint limitation noted with ROM b/l LE. Pes planus deformity noted bilateral LE.SABRA Patient ambulates with cane assistance.  Radiographs: None  Assessment/Plan: 1. Pain due to onychomycosis of toenails of both feet   2. Type 2 diabetes mellitus with stage 3b chronic kidney disease, without long-term current use of insulin  [facility])    Patient was evaluated and treated. All patient's and/or POA's questions/concerns addressed on today's visit. Toenails 1-5 b/l debrided in length and girth without incident. Continue foot and shoe inspections daily. Monitor blood glucose per PCP/Endocrinologist's recommendations. Continue soft, supportive shoe gear daily. Report any pedal injuries to medical professional. Call office if there are any questions/concerns. -Patient/POA to call should there be question/concern in the interim.   Return in about 3 months (around 07/04/2024).  Delon Walls Merlin, DPM      [place] LOCATION: [address] [place] [address]                   Office [phone]   [facility] LOCATION: [address] [place], [place] [address] Office [phone]     [1]  Allergies Allergen Reactions   Ace Inhibitors Cough   Azithromycin Other (See Comments)    Unknown   Pioglitazone Other (See Comments)    REACTION: edema   Sulfonamide Derivatives Rash   Tramadol Other (See Comments)    dizziness   Amlodipine  Other (See Comments)    Unfavorable reaction/headache   Hydrocodone -Acetaminophen  Other (See Comments)    acetaminophen  / hydrocodone    Propoxyphene Other (See Comments)    Unknown   "

## 2024-04-26 ENCOUNTER — Telehealth: Payer: Self-pay | Admitting: Cardiology

## 2024-04-26 NOTE — Telephone Encounter (Signed)
 Patient reports ongoing shortness of breath despite adherence to furosemide  (Lasix ) 40 mg daily. She states that at her recent PCP appointment she had lost 7lbs which may have been fluid. The patient also reports experiencing chest pain on four occasions over the past month, each episode relieved with sublingual nitroglycerin . Denies chest pain currently, states that she does have SOB when exerting herself. Instructed on ED protocol and verbalizes understanding.   The patient is requesting an in-office appointment with Dr. Jeffrie for further evaluation. She also reports having insurance-related paperwork that requires provider signature. Scheduled patient for appt with Dr Jeffrie 1/9.

## 2024-04-26 NOTE — Telephone Encounter (Signed)
 Patient calling to see if our office received forms that she needs the dr to fill out. Please advise

## 2024-04-27 ENCOUNTER — Encounter (HOSPITAL_COMMUNITY): Payer: Self-pay | Admitting: Nephrology

## 2024-04-28 ENCOUNTER — Encounter: Payer: Self-pay | Admitting: Cardiology

## 2024-04-28 ENCOUNTER — Ambulatory Visit: Attending: Cardiology | Admitting: Cardiology

## 2024-04-28 VITALS — BP 126/80 | HR 61 | Ht 62.0 in | Wt 209.4 lb

## 2024-04-28 DIAGNOSIS — I10 Essential (primary) hypertension: Secondary | ICD-10-CM | POA: Diagnosis not present

## 2024-04-28 DIAGNOSIS — I25118 Atherosclerotic heart disease of native coronary artery with other forms of angina pectoris: Secondary | ICD-10-CM

## 2024-04-28 NOTE — Progress Notes (Signed)
" °  Cardiology Office Note:  .   Date:  04/28/2024  ID:  Stacy Walls, DOB Aug 19, 1932, MRN [id number] PCP: No primary care provider on file.  [facility] Providers Cardiologist:  Oneil Parchment, MD    History of Present Illness: .   Stacy Walls is a 89 y.o. female Discussed the use of AI scribe software for clinical note transcription with the patient, who gave verbal consent to proceed.  History of Present Illness Stacy Walls is an 89 year old female with aortic atherosclerosis and coronary artery disease who presents for follow-up.  She feels 'pretty good today.' She experiences chest discomfort managed with nitroglycerin  approximately once every two to three weeks.  She reports some breathing difficulties, which have shown improvement.  Her current medications include aspirin  81 mg daily, atorvastatin  40 mg daily for hyperlipidemia, hydralazine  75 mg twice a day, metoprolol  12.5 mg twice a day, and Farxiga 10 mg daily for diabetes control. She has a history of ACE inhibitor-induced cough and headaches with amlodipine .  An echocardiogram from 2020 showed normal ejection fraction with coronary artery calcification.      Studies Reviewed: .        Results Diagnostic Echocardiogram (2020): Normal. Preserved ejection fraction. Coronary artery calcification present. Risk Assessment/Calculations:            Physical Exam:   VS:  BP 126/80   Pulse 61   Ht 5' 2 (1.575 m)   Wt 209 lb 6.4 oz (95 kg)   SpO2 93%   BMI 38.30 kg/m    Wt Readings from Last 3 Encounters:  04/28/24 209 lb 6.4 oz (95 kg)  03/27/24 207 lb 3.2 oz (94 kg)  03/09/24 208 lb 14.4 oz (94.8 kg)    GEN: Well nourished, well developed in no acute distress, crown hat NECK: No JVD; No carotid bruits CARDIAC: RRR, no murmurs, no rubs, no gallops RESPIRATORY:  Clear to auscultation without rales, wheezing or rhonchi  ABDOMEN: Soft, non-tender, non-distended EXTREMITIES:  No  edema; No deformity   ASSESSMENT AND PLAN: .    Assessment and Plan Assessment & Plan Stable angina pectoris in the setting of coronary artery disease and aortic atherosclerosis Intermittent use of nitroglycerin , approximately once every two to three weeks, indicating well-managed angina. No recent exacerbations or significant changes in symptoms. Echocardiogram from 2020 showed normal ejection fraction and coronary artery calcification. - Continue nitroglycerin  as needed for angina symptoms - Continue aspirin  81 mg daily - Continue atorvastatin  40 mg daily  Essential hypertension Blood pressure is well-controlled with current medication regimen. Issues are infrequent and manageable. - Continue hydralazine  75 mg twice a day - Continue metoprolol  12.5 mg twice a day  Hyperlipidemia Managed with atorvastatin . - Continue atorvastatin  40 mg daily, LDL goal < 70  Type 2 diabetes mellitus Managed with Farxiga. Issues are infrequent and manageable. - Continue Farxiga 10 mg daily         1 yr  Signed, Oneil Parchment, MD  "

## 2024-04-28 NOTE — Patient Instructions (Signed)

## 2024-06-20 ENCOUNTER — Ambulatory Visit

## 2024-07-04 ENCOUNTER — Ambulatory Visit: Admitting: Podiatry

## 2024-07-24 ENCOUNTER — Ambulatory Visit

## 2024-10-04 ENCOUNTER — Ambulatory Visit: Admitting: Podiatry

## 2025-03-22 ENCOUNTER — Inpatient Hospital Stay: Admitting: Nurse Practitioner

## 2025-03-22 ENCOUNTER — Inpatient Hospital Stay
